# Patient Record
Sex: Female | Born: 1940 | Race: White | Hispanic: No | Marital: Single | Smoking: Former smoker
Health system: Southern US, Community
[De-identification: ages and names within clinical notes are randomized; demographics above are authoritative.]

## PROBLEM LIST (undated history)

## (undated) ENCOUNTER — Emergency Department (HOSPITAL_COMMUNITY): Payer: Medicare Other

## (undated) DIAGNOSIS — E785 Hyperlipidemia, unspecified: Secondary | ICD-10-CM

## (undated) DIAGNOSIS — C50919 Malignant neoplasm of unspecified site of unspecified female breast: Secondary | ICD-10-CM

## (undated) DIAGNOSIS — C44622 Squamous cell carcinoma of skin of right upper limb, including shoulder: Secondary | ICD-10-CM

## (undated) DIAGNOSIS — T451X5A Adverse effect of antineoplastic and immunosuppressive drugs, initial encounter: Secondary | ICD-10-CM

## (undated) DIAGNOSIS — R55 Syncope and collapse: Secondary | ICD-10-CM

## (undated) DIAGNOSIS — F1020 Alcohol dependence, uncomplicated: Secondary | ICD-10-CM

## (undated) DIAGNOSIS — I10 Essential (primary) hypertension: Secondary | ICD-10-CM

## (undated) DIAGNOSIS — G629 Polyneuropathy, unspecified: Secondary | ICD-10-CM

## (undated) DIAGNOSIS — F419 Anxiety disorder, unspecified: Secondary | ICD-10-CM

## (undated) DIAGNOSIS — H269 Unspecified cataract: Secondary | ICD-10-CM

## (undated) DIAGNOSIS — N907 Vulvar cyst: Secondary | ICD-10-CM

## (undated) DIAGNOSIS — I428 Other cardiomyopathies: Secondary | ICD-10-CM

## (undated) DIAGNOSIS — E118 Type 2 diabetes mellitus with unspecified complications: Secondary | ICD-10-CM

## (undated) DIAGNOSIS — F329 Major depressive disorder, single episode, unspecified: Secondary | ICD-10-CM

## (undated) DIAGNOSIS — G459 Transient cerebral ischemic attack, unspecified: Secondary | ICD-10-CM

## (undated) DIAGNOSIS — N2889 Other specified disorders of kidney and ureter: Secondary | ICD-10-CM

## (undated) DIAGNOSIS — E039 Hypothyroidism, unspecified: Secondary | ICD-10-CM

## (undated) DIAGNOSIS — G620 Drug-induced polyneuropathy: Secondary | ICD-10-CM

## (undated) DIAGNOSIS — T7840XA Allergy, unspecified, initial encounter: Secondary | ICD-10-CM

## (undated) DIAGNOSIS — N183 Chronic kidney disease, stage 3 unspecified: Secondary | ICD-10-CM

## (undated) DIAGNOSIS — K219 Gastro-esophageal reflux disease without esophagitis: Secondary | ICD-10-CM

## (undated) DIAGNOSIS — Z85828 Personal history of other malignant neoplasm of skin: Secondary | ICD-10-CM

## (undated) DIAGNOSIS — I639 Cerebral infarction, unspecified: Secondary | ICD-10-CM

## (undated) DIAGNOSIS — M810 Age-related osteoporosis without current pathological fracture: Secondary | ICD-10-CM

## (undated) HISTORY — DX: Other cardiomyopathies: I42.8

## (undated) HISTORY — DX: Personal history of other malignant neoplasm of skin: Z85.828

## (undated) HISTORY — DX: Drug-induced polyneuropathy: G62.0

## (undated) HISTORY — DX: Cerebral infarction, unspecified: I63.9

## (undated) HISTORY — DX: Vulvar cyst: N90.7

## (undated) HISTORY — PX: CARDIAC CATHETERIZATION: SHX172

## (undated) HISTORY — DX: Other specified disorders of kidney and ureter: N28.89

## (undated) HISTORY — DX: Age-related osteoporosis without current pathological fracture: M81.0

## (undated) HISTORY — PX: FRACTURE SURGERY: SHX138

## (undated) HISTORY — PX: CATARACT EXTRACTION, BILATERAL: SHX1313

## (undated) HISTORY — DX: Adverse effect of antineoplastic and immunosuppressive drugs, initial encounter: T45.1X5A

## (undated) HISTORY — DX: Type 2 diabetes mellitus with unspecified complications: E11.8

## (undated) HISTORY — DX: Squamous cell carcinoma of skin of right upper limb, including shoulder: C44.622

## (undated) HISTORY — DX: Hyperlipidemia, unspecified: E78.5

## (undated) HISTORY — PX: JOINT REPLACEMENT: SHX530

## (undated) HISTORY — DX: Allergy, unspecified, initial encounter: T78.40XA

## (undated) HISTORY — DX: Chronic kidney disease, stage 3 unspecified: N18.30

## (undated) HISTORY — DX: Major depressive disorder, single episode, unspecified: F32.9

## (undated) HISTORY — DX: Anxiety disorder, unspecified: F41.9

## (undated) HISTORY — DX: Polyneuropathy, unspecified: G62.9

## (undated) HISTORY — PX: OTHER SURGICAL HISTORY: SHX169

## (undated) HISTORY — DX: Alcohol dependence, uncomplicated: F10.20

## (undated) HISTORY — DX: Unspecified cataract: H26.9

## (undated) HISTORY — DX: Syncope and collapse: R55

## (undated) NOTE — *Deleted (*Deleted)
Patient Care Team: Jeoffrey Massed, MD as PCP - General (Family Medicine) Ovidio Kin, MD as Consulting Physician (General Surgery) Serena Croissant, MD as Consulting Physician (Hematology and Oncology) Dorothy Puffer, MD as Consulting Physician (Radiation Oncology) Glenna Fellows, MD as Consulting Physician (Plastic Surgery) Betsey Amen, MD as Consulting Physician (Endocrinology) Axel Filler, Larna Daughters, NP as Nurse Practitioner (Hematology and Oncology) Genia Del, MD as Consulting Physician (Obstetrics and Gynecology)  DIAGNOSIS: No diagnosis found.  SUMMARY OF ONCOLOGIC HISTORY: Oncology History  Breast cancer of upper-inner quadrant of right female breast (HCC)  02/29/2016 Initial Diagnosis   Right breast palpable mass (with silicone implants 1982), 3.5 cm on MRI, additional 3 cm anterior linear enhancement? 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 Ki-67 60%, T2 N0 stage 2A clinical stage   03/14/2016 Procedure   Right breast biopsy upper inner quadrant: IDC grade 3   03/28/2016 -  Neo-Adjuvant Chemotherapy   Neoadjuvant chemotherapy with dose dense Adriamycin and Cytoxan followed by Abraxane weekly 5 ( patient is diabetic and cannot take steroids)   07/15/2016 Breast MRI   Right breast: Spiculated mass 1.5 cm significantly smaller compared to prior,NME previously seen is not noted, no abnormal lymph nodes   09/13/2016 Surgery   Removal of the silicone implant due to intracapsular rupture and capsulectomy (Dr.Thimappa)   09/13/2016 Surgery   Right lumpectomy: IDC grade 3, 2 foci, 2 cm and 1.1 cm, 0/3 lymph nodes negative margins negative, ER 0%, PR 0%, HER-2 negative ratio 1.02, Ki-67 60%, RCB-II; ypT2ypN0 Stage 2A    10/20/2016 - 12/06/2016 Radiation Therapy   Adjuvant radiation therapy   12/30/2016 - 04/05/2017 Chemotherapy   Xeloda 1000 mg by mouth twice a day adjuvant therapy x 4 cycles    05/03/2017 - 05/24/2017 Chemotherapy   SWOG S 1418  Pembrolizumab on clinical trial stopped after 2 doses by patient preference (not due to toxicities .)     CHIEF COMPLIANT: Follow-up of right breast cancer and DVT on Xarelto   INTERVAL HISTORY: Marissa Hall is a 3 y.o. with above-mentioned history of right breast cancer treated with neoadjuvant chemotherapy, lumpectomy, radiation, and is currently on surveillance. 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 She presents to the clinic today for follow-up.   ALLERGIES:  is allergic to other and penicillins.  MEDICATIONS:  Current Outpatient Medications  Medication Sig Dispense Refill  . atorvastatin (LIPITOR) 80 MG tablet Take 80 mg by mouth every evening.     . cholecalciferol (VITAMIN D3) 25 MCG (1000 UNIT) tablet Take 1,000 Units by mouth daily.    . DULoxetine (CYMBALTA) 30 MG capsule Take 1 capsule (30 mg total) by mouth daily. 30 capsule 0  . gabapentin (NEURONTIN) 300 MG capsule Take 1 capsule (300 mg total) by mouth See admin instructions. Takes 300 mg in the morning and 600 mg at bedtime. 270 capsule 3  . levothyroxine (SYNTHROID) 112 MCG tablet 112 mcg daily before breakfast.     . lisinopril (ZESTRIL) 30 MG tablet Take 1 tablet (30 mg total) by mouth daily. 90 tablet 1  . metFORMIN (GLUCOPHAGE-XR) 500 MG 24 hr tablet Take 1,000 mg by mouth daily before supper.     . NONFORMULARY OR COMPOUNDED ITEM Estradiol vaginal cream 0.02% insert 1/4 of an applicator vaginally and thin layer vulva daily x 2 weeks.  Then 1/4 of an applicator vaginally and a thin layer on the vulva twice a week for long term (Patient taking differently: Apply 1 application  topically See admin instructions. Use 1/4 of an applicator vaginally and a thin layer on the vulva twice a week.) 90 each 3  . Propylene Glycol (SYSTANE BALANCE OP) Place 1 drop into both eyes daily as needed (for dry eyes).    . traZODone (DESYREL) 50 MG tablet Take 1 tablet (50 mg total) by mouth at bedtime. 90 tablet 3   No  current facility-administered medications for this visit.    PHYSICAL EXAMINATION: ECOG PERFORMANCE STATUS: {CHL ONC ECOG PS:[phone]}  There were no vitals filed for this visit. There were no vitals filed for this visit.  BREAST:*** No palpable masses or nodules in either right or left breasts. No palpable axillary supraclavicular or infraclavicular adenopathy no breast tenderness or nipple discharge. (exam performed in the presence of a chaperone)  LABORATORY DATA:  I have reviewed the data as listed CMP Latest Ref Rng & Units 11/14/2019 10/22/2019 04/24/2019  Glucose 70 - 99 mg/dL 161(W) 960(A) 540(J)  BUN 8 - 23 mg/dL 22 14 20   Creatinine 0.44 - 1.00 mg/dL 8.11 9.14(N) 8.29  Sodium 135 - 145 mmol/L 138 140 139  Potassium 3.5 - 5.1 mmol/L 4.2 4.3 4.8  Chloride 98 - 111 mmol/L 106 102 101  CO2 22 - 32 mmol/L 25 25 25   Calcium 8.9 - 10.3 mg/dL 9.2 9.8 9.7  Total Protein 6.5 - 8.1 g/dL 7.5 7.2 7.9  Total Bilirubin 0.3 - 1.2 mg/dL 5.6(O) 1.3(Y) 1.2  Alkaline Phos 38 - 126 U/L 58 65 62  AST 15 - 41 U/L 30 27 35  ALT 0 - 44 U/L 37 34 40    Lab Results  Component Value Date   WBC 4.5 11/14/2019   HGB 12.7 11/14/2019   HCT 38.3 11/14/2019   MCV 98.5 11/14/2019   PLT 136 (L) 11/14/2019   NEUTROABS 2.5 11/14/2019    ASSESSMENT & PLAN:  No problem-specific Assessment & Plan notes found for this encounter.    No orders of the defined types were placed in this encounter.  The patient has a good understanding of the overall plan. she agrees with it. she will call with any problems that may develop before the next visit here.  Total time spent: *** mins including face to face time and time spent for planning, charting and coordination of care  Serena Croissant, MD 04/26/2020  I, Kirt Boys Dorshimer, am acting as scribe for Dr. Serena Croissant.  {insert scribe attestation}

---
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
## 1962-06-06 DIAGNOSIS — F32A Depression, unspecified: Secondary | ICD-10-CM

## 1962-06-06 HISTORY — DX: Depression, unspecified: F32.A

## 1970-06-06 HISTORY — PX: ABDOMINAL HYSTERECTOMY: SHX81

## 1970-06-06 HISTORY — PX: APPENDECTOMY: SHX54

## 1980-06-06 HISTORY — PX: BREAST ENHANCEMENT SURGERY: SHX7

## 1984-06-06 DIAGNOSIS — E785 Hyperlipidemia, unspecified: Secondary | ICD-10-CM

## 1984-06-06 DIAGNOSIS — E1169 Type 2 diabetes mellitus with other specified complication: Secondary | ICD-10-CM | POA: Insufficient documentation

## 1984-06-06 HISTORY — DX: Hyperlipidemia, unspecified: E78.5

## 1986-06-06 DIAGNOSIS — E039 Hypothyroidism, unspecified: Secondary | ICD-10-CM

## 1986-06-06 HISTORY — DX: Hypothyroidism, unspecified: E03.9

## 2006-06-06 DIAGNOSIS — E118 Type 2 diabetes mellitus with unspecified complications: Secondary | ICD-10-CM

## 2006-06-06 DIAGNOSIS — N907 Vulvar cyst: Secondary | ICD-10-CM | POA: Insufficient documentation

## 2006-06-06 DIAGNOSIS — I10 Essential (primary) hypertension: Secondary | ICD-10-CM

## 2006-06-06 HISTORY — DX: Essential (primary) hypertension: I10

## 2006-06-06 HISTORY — DX: Type 2 diabetes mellitus with unspecified complications: E11.8

## 2007-06-07 HISTORY — PX: OTHER SURGICAL HISTORY: SHX169

## 2011-03-22 ENCOUNTER — Encounter (HOSPITAL_COMMUNITY): Payer: Self-pay

## 2011-03-22 ENCOUNTER — Emergency Department (HOSPITAL_COMMUNITY)
Admission: EM | Admit: 2011-03-22 | Discharge: 2011-03-22 | Disposition: A | Discharge status: Home / Self Care | Payer: Medicare Other | Attending: Emergency Medicine | Admitting: Emergency Medicine

## 2011-03-22 ENCOUNTER — Emergency Department (HOSPITAL_COMMUNITY): Payer: Medicare Other

## 2011-03-22 DIAGNOSIS — H43399 Other vitreous opacities, unspecified eye: Secondary | ICD-10-CM | POA: Insufficient documentation

## 2011-03-22 DIAGNOSIS — H543 Unqualified visual loss, both eyes: Secondary | ICD-10-CM | POA: Insufficient documentation

## 2011-03-22 DIAGNOSIS — H53149 Visual discomfort, unspecified: Secondary | ICD-10-CM | POA: Insufficient documentation

## 2011-03-22 DIAGNOSIS — G43109 Migraine with aura, not intractable, without status migrainosus: Secondary | ICD-10-CM | POA: Insufficient documentation

## 2011-03-22 HISTORY — DX: Transient cerebral ischemic attack, unspecified: G45.9

## 2011-03-22 HISTORY — DX: Essential (primary) hypertension: I10

## 2011-03-22 HISTORY — DX: Hypothyroidism, unspecified: E03.9

## 2011-03-22 LAB — DIFFERENTIAL
Lymphocytes Relative: 44 % (ref 12–46)
Lymphs Abs: 2.1 10*3/uL (ref 0.7–4.0)
Neutro Abs: 2.1 10*3/uL (ref 1.7–7.7)
Neutrophils Relative %: 44 % (ref 43–77)

## 2011-03-22 LAB — CBC
HCT: 39 % (ref 36.0–46.0)
Hemoglobin: 12.9 g/dL (ref 12.0–15.0)
MCV: 93.3 fL (ref 78.0–100.0)
RBC: 4.18 MIL/uL (ref 3.87–5.11)
WBC: 4.8 10*3/uL (ref 4.0–10.5)

## 2011-03-22 LAB — COMPREHENSIVE METABOLIC PANEL
AST: 15 U/L (ref 0–37)
Alkaline Phosphatase: 53 U/L (ref 39–117)
BUN: 22 mg/dL (ref 6–23)
CO2: 27 mEq/L (ref 19–32)
Chloride: 106 mEq/L (ref 96–112)
Creatinine, Ser: 0.67 mg/dL (ref 0.50–1.10)
GFR calc non Af Amer: 89 mL/min — ABNORMAL LOW (ref >90)
Total Bilirubin: 0.5 mg/dL (ref 0.3–1.2)

## 2011-03-26 DIAGNOSIS — G459 Transient cerebral ischemic attack, unspecified: Secondary | ICD-10-CM | POA: Insufficient documentation

## 2011-03-26 HISTORY — DX: Transient cerebral ischemic attack, unspecified: G45.9

## 2011-04-19 ENCOUNTER — Other Ambulatory Visit (HOSPITAL_COMMUNITY): Payer: Self-pay | Admitting: Radiology

## 2011-04-19 DIAGNOSIS — G459 Transient cerebral ischemic attack, unspecified: Secondary | ICD-10-CM

## 2011-04-20 ENCOUNTER — Ambulatory Visit (HOSPITAL_COMMUNITY): Payer: Medicare Other | Attending: Cardiology | Admitting: Radiology

## 2011-04-20 DIAGNOSIS — G459 Transient cerebral ischemic attack, unspecified: Secondary | ICD-10-CM | POA: Insufficient documentation

## 2011-04-20 DIAGNOSIS — E119 Type 2 diabetes mellitus without complications: Secondary | ICD-10-CM | POA: Insufficient documentation

## 2011-04-20 DIAGNOSIS — I059 Rheumatic mitral valve disease, unspecified: Secondary | ICD-10-CM | POA: Insufficient documentation

## 2011-04-21 ENCOUNTER — Encounter (HOSPITAL_COMMUNITY): Payer: Self-pay | Admitting: Diagnostic Neuroimaging

## 2011-06-07 DIAGNOSIS — K219 Gastro-esophageal reflux disease without esophagitis: Secondary | ICD-10-CM | POA: Insufficient documentation

## 2011-06-07 HISTORY — DX: Gastro-esophageal reflux disease without esophagitis: K21.9

## 2013-06-06 DIAGNOSIS — M810 Age-related osteoporosis without current pathological fracture: Secondary | ICD-10-CM

## 2013-06-06 HISTORY — DX: Age-related osteoporosis without current pathological fracture: M81.0

## 2014-02-19 DIAGNOSIS — F1020 Alcohol dependence, uncomplicated: Secondary | ICD-10-CM | POA: Insufficient documentation

## 2014-02-27 DIAGNOSIS — F339 Major depressive disorder, recurrent, unspecified: Secondary | ICD-10-CM | POA: Insufficient documentation

## 2014-06-11 DIAGNOSIS — F330 Major depressive disorder, recurrent, mild: Secondary | ICD-10-CM | POA: Diagnosis not present

## 2014-06-23 DIAGNOSIS — R300 Dysuria: Secondary | ICD-10-CM | POA: Diagnosis not present

## 2014-06-23 DIAGNOSIS — N390 Urinary tract infection, site not specified: Secondary | ICD-10-CM | POA: Diagnosis not present

## 2014-08-25 DIAGNOSIS — Z880 Allergy status to penicillin: Secondary | ICD-10-CM | POA: Diagnosis not present

## 2014-08-25 DIAGNOSIS — I10 Essential (primary) hypertension: Secondary | ICD-10-CM | POA: Diagnosis not present

## 2014-08-25 DIAGNOSIS — Z79899 Other long term (current) drug therapy: Secondary | ICD-10-CM | POA: Diagnosis not present

## 2014-08-25 DIAGNOSIS — Z9119 Patient's noncompliance with other medical treatment and regimen: Secondary | ICD-10-CM | POA: Diagnosis not present

## 2014-08-25 DIAGNOSIS — R079 Chest pain, unspecified: Secondary | ICD-10-CM | POA: Diagnosis not present

## 2014-08-25 DIAGNOSIS — R946 Abnormal results of thyroid function studies: Secondary | ICD-10-CM | POA: Diagnosis not present

## 2014-08-25 DIAGNOSIS — R001 Bradycardia, unspecified: Secondary | ICD-10-CM | POA: Diagnosis not present

## 2014-08-25 DIAGNOSIS — K219 Gastro-esophageal reflux disease without esophagitis: Secondary | ICD-10-CM | POA: Diagnosis not present

## 2014-08-25 DIAGNOSIS — R0789 Other chest pain: Secondary | ICD-10-CM | POA: Diagnosis not present

## 2014-08-25 DIAGNOSIS — Z888 Allergy status to other drugs, medicaments and biological substances status: Secondary | ICD-10-CM | POA: Diagnosis not present

## 2014-08-25 DIAGNOSIS — F319 Bipolar disorder, unspecified: Secondary | ICD-10-CM | POA: Diagnosis not present

## 2014-08-25 DIAGNOSIS — E039 Hypothyroidism, unspecified: Secondary | ICD-10-CM | POA: Diagnosis not present

## 2014-08-25 DIAGNOSIS — Z87891 Personal history of nicotine dependence: Secondary | ICD-10-CM | POA: Diagnosis not present

## 2014-08-25 DIAGNOSIS — R000 Tachycardia, unspecified: Secondary | ICD-10-CM | POA: Diagnosis not present

## 2014-08-25 DIAGNOSIS — E785 Hyperlipidemia, unspecified: Secondary | ICD-10-CM | POA: Diagnosis not present

## 2014-08-25 DIAGNOSIS — I4510 Unspecified right bundle-branch block: Secondary | ICD-10-CM | POA: Diagnosis not present

## 2014-09-15 DIAGNOSIS — J040 Acute laryngitis: Secondary | ICD-10-CM | POA: Diagnosis not present

## 2014-09-15 DIAGNOSIS — J029 Acute pharyngitis, unspecified: Secondary | ICD-10-CM | POA: Diagnosis not present

## 2014-09-15 DIAGNOSIS — J069 Acute upper respiratory infection, unspecified: Secondary | ICD-10-CM | POA: Diagnosis not present

## 2014-09-15 DIAGNOSIS — R05 Cough: Secondary | ICD-10-CM | POA: Diagnosis not present

## 2014-10-21 DIAGNOSIS — E119 Type 2 diabetes mellitus without complications: Secondary | ICD-10-CM | POA: Diagnosis not present

## 2014-10-21 DIAGNOSIS — H524 Presbyopia: Secondary | ICD-10-CM | POA: Diagnosis not present

## 2014-10-21 DIAGNOSIS — H52223 Regular astigmatism, bilateral: Secondary | ICD-10-CM | POA: Diagnosis not present

## 2014-10-21 DIAGNOSIS — H5203 Hypermetropia, bilateral: Secondary | ICD-10-CM | POA: Diagnosis not present

## 2014-10-29 DIAGNOSIS — E559 Vitamin D deficiency, unspecified: Secondary | ICD-10-CM | POA: Diagnosis not present

## 2014-10-29 DIAGNOSIS — F319 Bipolar disorder, unspecified: Secondary | ICD-10-CM | POA: Diagnosis not present

## 2014-10-29 DIAGNOSIS — E785 Hyperlipidemia, unspecified: Secondary | ICD-10-CM | POA: Diagnosis not present

## 2014-10-29 DIAGNOSIS — I10 Essential (primary) hypertension: Secondary | ICD-10-CM | POA: Diagnosis not present

## 2014-10-29 DIAGNOSIS — Z79899 Other long term (current) drug therapy: Secondary | ICD-10-CM | POA: Diagnosis not present

## 2014-10-29 DIAGNOSIS — Z6827 Body mass index (BMI) 27.0-27.9, adult: Secondary | ICD-10-CM | POA: Diagnosis not present

## 2014-10-29 DIAGNOSIS — E039 Hypothyroidism, unspecified: Secondary | ICD-10-CM | POA: Diagnosis not present

## 2014-10-29 DIAGNOSIS — E1165 Type 2 diabetes mellitus with hyperglycemia: Secondary | ICD-10-CM | POA: Diagnosis not present

## 2014-10-29 DIAGNOSIS — J45909 Unspecified asthma, uncomplicated: Secondary | ICD-10-CM | POA: Diagnosis not present

## 2014-10-29 DIAGNOSIS — E538 Deficiency of other specified B group vitamins: Secondary | ICD-10-CM | POA: Diagnosis not present

## 2014-12-18 DIAGNOSIS — L821 Other seborrheic keratosis: Secondary | ICD-10-CM | POA: Diagnosis not present

## 2014-12-18 DIAGNOSIS — L570 Actinic keratosis: Secondary | ICD-10-CM | POA: Diagnosis not present

## 2014-12-18 DIAGNOSIS — Z85828 Personal history of other malignant neoplasm of skin: Secondary | ICD-10-CM | POA: Diagnosis not present

## 2014-12-18 DIAGNOSIS — D045 Carcinoma in situ of skin of trunk: Secondary | ICD-10-CM | POA: Diagnosis not present

## 2014-12-18 DIAGNOSIS — D229 Melanocytic nevi, unspecified: Secondary | ICD-10-CM | POA: Diagnosis not present

## 2014-12-18 DIAGNOSIS — Z808 Family history of malignant neoplasm of other organs or systems: Secondary | ICD-10-CM | POA: Diagnosis not present

## 2014-12-18 DIAGNOSIS — L814 Other melanin hyperpigmentation: Secondary | ICD-10-CM | POA: Diagnosis not present

## 2014-12-19 DIAGNOSIS — D045 Carcinoma in situ of skin of trunk: Secondary | ICD-10-CM | POA: Diagnosis not present

## 2014-12-25 DIAGNOSIS — J302 Other seasonal allergic rhinitis: Secondary | ICD-10-CM | POA: Diagnosis not present

## 2014-12-25 DIAGNOSIS — R0981 Nasal congestion: Secondary | ICD-10-CM | POA: Diagnosis not present

## 2014-12-25 DIAGNOSIS — R05 Cough: Secondary | ICD-10-CM | POA: Diagnosis not present

## 2014-12-30 DIAGNOSIS — J302 Other seasonal allergic rhinitis: Secondary | ICD-10-CM | POA: Diagnosis not present

## 2014-12-30 DIAGNOSIS — R0981 Nasal congestion: Secondary | ICD-10-CM | POA: Diagnosis not present

## 2015-03-11 DIAGNOSIS — E559 Vitamin D deficiency, unspecified: Secondary | ICD-10-CM | POA: Diagnosis not present

## 2015-03-11 DIAGNOSIS — H6123 Impacted cerumen, bilateral: Secondary | ICD-10-CM | POA: Diagnosis not present

## 2015-03-11 DIAGNOSIS — J45909 Unspecified asthma, uncomplicated: Secondary | ICD-10-CM | POA: Diagnosis not present

## 2015-03-11 DIAGNOSIS — Z23 Encounter for immunization: Secondary | ICD-10-CM | POA: Diagnosis not present

## 2015-03-11 DIAGNOSIS — E1165 Type 2 diabetes mellitus with hyperglycemia: Secondary | ICD-10-CM | POA: Diagnosis not present

## 2015-03-11 DIAGNOSIS — E785 Hyperlipidemia, unspecified: Secondary | ICD-10-CM | POA: Diagnosis not present

## 2015-03-11 DIAGNOSIS — Z79899 Other long term (current) drug therapy: Secondary | ICD-10-CM | POA: Diagnosis not present

## 2015-03-11 DIAGNOSIS — E538 Deficiency of other specified B group vitamins: Secondary | ICD-10-CM | POA: Diagnosis not present

## 2015-03-11 DIAGNOSIS — E039 Hypothyroidism, unspecified: Secondary | ICD-10-CM | POA: Diagnosis not present

## 2015-03-11 DIAGNOSIS — I10 Essential (primary) hypertension: Secondary | ICD-10-CM | POA: Diagnosis not present

## 2015-03-11 DIAGNOSIS — F319 Bipolar disorder, unspecified: Secondary | ICD-10-CM | POA: Diagnosis not present

## 2015-03-11 DIAGNOSIS — Z6827 Body mass index (BMI) 27.0-27.9, adult: Secondary | ICD-10-CM | POA: Diagnosis not present

## 2015-04-15 DIAGNOSIS — H6983 Other specified disorders of Eustachian tube, bilateral: Secondary | ICD-10-CM | POA: Diagnosis not present

## 2015-04-15 DIAGNOSIS — J069 Acute upper respiratory infection, unspecified: Secondary | ICD-10-CM | POA: Diagnosis not present

## 2015-06-07 DIAGNOSIS — G629 Polyneuropathy, unspecified: Secondary | ICD-10-CM

## 2015-06-07 HISTORY — DX: Polyneuropathy, unspecified: G62.9

## 2015-06-08 DIAGNOSIS — M26622 Arthralgia of left temporomandibular joint: Secondary | ICD-10-CM | POA: Diagnosis not present

## 2015-06-18 DIAGNOSIS — D2260 Melanocytic nevi of unspecified upper limb, including shoulder: Secondary | ICD-10-CM | POA: Diagnosis not present

## 2015-06-18 DIAGNOSIS — D225 Melanocytic nevi of trunk: Secondary | ICD-10-CM | POA: Diagnosis not present

## 2015-06-18 DIAGNOSIS — C44722 Squamous cell carcinoma of skin of right lower limb, including hip: Secondary | ICD-10-CM | POA: Diagnosis not present

## 2015-06-18 DIAGNOSIS — Z85828 Personal history of other malignant neoplasm of skin: Secondary | ICD-10-CM | POA: Diagnosis not present

## 2015-06-18 DIAGNOSIS — Z86008 Personal history of in-situ neoplasm of other site: Secondary | ICD-10-CM | POA: Diagnosis not present

## 2015-06-18 DIAGNOSIS — D2270 Melanocytic nevi of unspecified lower limb, including hip: Secondary | ICD-10-CM | POA: Diagnosis not present

## 2015-06-18 DIAGNOSIS — L570 Actinic keratosis: Secondary | ICD-10-CM | POA: Diagnosis not present

## 2015-06-19 DIAGNOSIS — C44722 Squamous cell carcinoma of skin of right lower limb, including hip: Secondary | ICD-10-CM | POA: Diagnosis not present

## 2015-06-24 DIAGNOSIS — C44722 Squamous cell carcinoma of skin of right lower limb, including hip: Secondary | ICD-10-CM | POA: Diagnosis not present

## 2015-07-09 DIAGNOSIS — E559 Vitamin D deficiency, unspecified: Secondary | ICD-10-CM | POA: Diagnosis not present

## 2015-07-09 DIAGNOSIS — Z6826 Body mass index (BMI) 26.0-26.9, adult: Secondary | ICD-10-CM | POA: Diagnosis not present

## 2015-07-09 DIAGNOSIS — J449 Chronic obstructive pulmonary disease, unspecified: Secondary | ICD-10-CM | POA: Diagnosis not present

## 2015-07-09 DIAGNOSIS — E1140 Type 2 diabetes mellitus with diabetic neuropathy, unspecified: Secondary | ICD-10-CM | POA: Diagnosis not present

## 2015-07-09 DIAGNOSIS — E785 Hyperlipidemia, unspecified: Secondary | ICD-10-CM | POA: Diagnosis not present

## 2015-07-09 DIAGNOSIS — E538 Deficiency of other specified B group vitamins: Secondary | ICD-10-CM | POA: Diagnosis not present

## 2015-07-09 DIAGNOSIS — E039 Hypothyroidism, unspecified: Secondary | ICD-10-CM | POA: Diagnosis not present

## 2015-07-09 DIAGNOSIS — I10 Essential (primary) hypertension: Secondary | ICD-10-CM | POA: Diagnosis not present

## 2015-09-16 DIAGNOSIS — I10 Essential (primary) hypertension: Secondary | ICD-10-CM | POA: Diagnosis not present

## 2015-09-16 DIAGNOSIS — E1165 Type 2 diabetes mellitus with hyperglycemia: Secondary | ICD-10-CM | POA: Diagnosis not present

## 2015-09-16 DIAGNOSIS — E785 Hyperlipidemia, unspecified: Secondary | ICD-10-CM | POA: Diagnosis not present

## 2015-09-16 DIAGNOSIS — E1140 Type 2 diabetes mellitus with diabetic neuropathy, unspecified: Secondary | ICD-10-CM | POA: Diagnosis not present

## 2015-09-16 DIAGNOSIS — E1169 Type 2 diabetes mellitus with other specified complication: Secondary | ICD-10-CM | POA: Diagnosis not present

## 2015-09-16 DIAGNOSIS — E039 Hypothyroidism, unspecified: Secondary | ICD-10-CM | POA: Diagnosis not present

## 2015-10-28 DIAGNOSIS — E039 Hypothyroidism, unspecified: Secondary | ICD-10-CM | POA: Diagnosis not present

## 2015-11-04 DIAGNOSIS — E1169 Type 2 diabetes mellitus with other specified complication: Secondary | ICD-10-CM | POA: Diagnosis not present

## 2015-11-04 DIAGNOSIS — E1140 Type 2 diabetes mellitus with diabetic neuropathy, unspecified: Secondary | ICD-10-CM | POA: Diagnosis not present

## 2015-11-04 DIAGNOSIS — I10 Essential (primary) hypertension: Secondary | ICD-10-CM | POA: Diagnosis not present

## 2015-11-04 DIAGNOSIS — E1165 Type 2 diabetes mellitus with hyperglycemia: Secondary | ICD-10-CM | POA: Diagnosis not present

## 2015-11-04 DIAGNOSIS — E785 Hyperlipidemia, unspecified: Secondary | ICD-10-CM | POA: Diagnosis not present

## 2015-11-04 DIAGNOSIS — E039 Hypothyroidism, unspecified: Secondary | ICD-10-CM | POA: Diagnosis not present

## 2015-11-10 DIAGNOSIS — H6983 Other specified disorders of Eustachian tube, bilateral: Secondary | ICD-10-CM | POA: Diagnosis not present

## 2015-11-10 DIAGNOSIS — J329 Chronic sinusitis, unspecified: Secondary | ICD-10-CM | POA: Diagnosis not present

## 2015-12-16 DIAGNOSIS — E039 Hypothyroidism, unspecified: Secondary | ICD-10-CM | POA: Diagnosis not present

## 2015-12-22 DIAGNOSIS — D2260 Melanocytic nevi of unspecified upper limb, including shoulder: Secondary | ICD-10-CM | POA: Diagnosis not present

## 2015-12-22 DIAGNOSIS — L568 Other specified acute skin changes due to ultraviolet radiation: Secondary | ICD-10-CM | POA: Diagnosis not present

## 2015-12-22 DIAGNOSIS — I781 Nevus, non-neoplastic: Secondary | ICD-10-CM | POA: Diagnosis not present

## 2015-12-22 DIAGNOSIS — L821 Other seborrheic keratosis: Secondary | ICD-10-CM | POA: Diagnosis not present

## 2015-12-22 DIAGNOSIS — D045 Carcinoma in situ of skin of trunk: Secondary | ICD-10-CM | POA: Diagnosis not present

## 2015-12-22 DIAGNOSIS — D225 Melanocytic nevi of trunk: Secondary | ICD-10-CM | POA: Diagnosis not present

## 2015-12-22 DIAGNOSIS — D2270 Melanocytic nevi of unspecified lower limb, including hip: Secondary | ICD-10-CM | POA: Diagnosis not present

## 2015-12-22 DIAGNOSIS — Z85828 Personal history of other malignant neoplasm of skin: Secondary | ICD-10-CM | POA: Diagnosis not present

## 2015-12-22 DIAGNOSIS — L814 Other melanin hyperpigmentation: Secondary | ICD-10-CM | POA: Diagnosis not present

## 2016-01-20 DIAGNOSIS — Z1389 Encounter for screening for other disorder: Secondary | ICD-10-CM | POA: Diagnosis not present

## 2016-01-20 DIAGNOSIS — F411 Generalized anxiety disorder: Secondary | ICD-10-CM | POA: Diagnosis not present

## 2016-01-20 DIAGNOSIS — Z6826 Body mass index (BMI) 26.0-26.9, adult: Secondary | ICD-10-CM | POA: Diagnosis not present

## 2016-01-20 DIAGNOSIS — Z79899 Other long term (current) drug therapy: Secondary | ICD-10-CM | POA: Diagnosis not present

## 2016-01-20 DIAGNOSIS — F319 Bipolar disorder, unspecified: Secondary | ICD-10-CM | POA: Diagnosis not present

## 2016-01-20 DIAGNOSIS — G479 Sleep disorder, unspecified: Secondary | ICD-10-CM | POA: Diagnosis not present

## 2016-01-20 DIAGNOSIS — R51 Headache: Secondary | ICD-10-CM | POA: Diagnosis not present

## 2016-02-05 DIAGNOSIS — E039 Hypothyroidism, unspecified: Secondary | ICD-10-CM | POA: Diagnosis not present

## 2016-02-10 DIAGNOSIS — E785 Hyperlipidemia, unspecified: Secondary | ICD-10-CM | POA: Diagnosis not present

## 2016-02-10 DIAGNOSIS — E1159 Type 2 diabetes mellitus with other circulatory complications: Secondary | ICD-10-CM | POA: Diagnosis not present

## 2016-02-10 DIAGNOSIS — I10 Essential (primary) hypertension: Secondary | ICD-10-CM | POA: Diagnosis not present

## 2016-02-10 DIAGNOSIS — E1169 Type 2 diabetes mellitus with other specified complication: Secondary | ICD-10-CM | POA: Diagnosis not present

## 2016-02-10 DIAGNOSIS — E1165 Type 2 diabetes mellitus with hyperglycemia: Secondary | ICD-10-CM | POA: Diagnosis not present

## 2016-02-10 DIAGNOSIS — E1140 Type 2 diabetes mellitus with diabetic neuropathy, unspecified: Secondary | ICD-10-CM | POA: Diagnosis not present

## 2016-02-10 DIAGNOSIS — E039 Hypothyroidism, unspecified: Secondary | ICD-10-CM | POA: Diagnosis not present

## 2016-02-16 DIAGNOSIS — Z779 Other contact with and (suspected) exposures hazardous to health: Secondary | ICD-10-CM | POA: Diagnosis not present

## 2016-02-16 DIAGNOSIS — B977 Papillomavirus as the cause of diseases classified elsewhere: Secondary | ICD-10-CM | POA: Diagnosis not present

## 2016-02-25 DIAGNOSIS — N63 Unspecified lump in breast: Secondary | ICD-10-CM | POA: Diagnosis not present

## 2016-02-29 DIAGNOSIS — C50211 Malignant neoplasm of upper-inner quadrant of right female breast: Secondary | ICD-10-CM | POA: Diagnosis not present

## 2016-02-29 DIAGNOSIS — N63 Unspecified lump in breast: Secondary | ICD-10-CM | POA: Diagnosis not present

## 2016-02-29 DIAGNOSIS — N6011 Diffuse cystic mastopathy of right breast: Secondary | ICD-10-CM | POA: Diagnosis not present

## 2016-03-01 ENCOUNTER — Other Ambulatory Visit: Payer: Self-pay | Admitting: Radiology

## 2016-03-01 DIAGNOSIS — D0591 Unspecified type of carcinoma in situ of right breast: Secondary | ICD-10-CM

## 2016-03-02 ENCOUNTER — Telehealth: Payer: Self-pay | Admitting: *Deleted

## 2016-03-02 DIAGNOSIS — C50411 Malignant neoplasm of upper-outer quadrant of right female breast: Secondary | ICD-10-CM

## 2016-03-02 NOTE — Telephone Encounter (Signed)
Confirmed [facility] for 03/09/16 at 0815am .  Instructions and contact information given.

## 2016-03-04 ENCOUNTER — Ambulatory Visit
Admission: RE | Admit: 2016-03-04 | Discharge: 2016-03-04 | Disposition: A | Discharge status: Home / Self Care | Payer: Medicare Other | Source: Ambulatory Visit | Attending: Radiology | Admitting: Radiology

## 2016-03-04 DIAGNOSIS — D0591 Unspecified type of carcinoma in situ of right breast: Secondary | ICD-10-CM

## 2016-03-04 DIAGNOSIS — C50211 Malignant neoplasm of upper-inner quadrant of right female breast: Secondary | ICD-10-CM | POA: Diagnosis not present

## 2016-03-04 MED ORDER — GADOBENATE DIMEGLUMINE 529 MG/ML IV SOLN
20.0000 mL | Freq: Once | INTRAVENOUS | Status: AC | PRN
  Administered 2016-03-04: 16 mL via INTRAVENOUS

## 2016-03-09 ENCOUNTER — Ambulatory Visit: Payer: Medicare Other | Attending: Surgery | Admitting: Physical Therapy

## 2016-03-09 ENCOUNTER — Encounter: Payer: Self-pay | Admitting: Physical Therapy

## 2016-03-09 ENCOUNTER — Ambulatory Visit
Admission: RE | Admit: 2016-03-09 | Discharge: 2016-03-09 | Disposition: A | Discharge status: Home / Self Care | Payer: Medicare Other | Source: Ambulatory Visit | Attending: Radiation Oncology | Admitting: Radiation Oncology

## 2016-03-09 ENCOUNTER — Encounter: Payer: Self-pay | Admitting: Hematology and Oncology

## 2016-03-09 ENCOUNTER — Other Ambulatory Visit: Payer: Self-pay | Admitting: Surgery

## 2016-03-09 ENCOUNTER — Other Ambulatory Visit (HOSPITAL_BASED_OUTPATIENT_CLINIC_OR_DEPARTMENT_OTHER): Payer: Medicare Other

## 2016-03-09 ENCOUNTER — Encounter: Payer: Self-pay | Admitting: *Deleted

## 2016-03-09 ENCOUNTER — Other Ambulatory Visit: Payer: Self-pay | Admitting: Hematology and Oncology

## 2016-03-09 ENCOUNTER — Ambulatory Visit (HOSPITAL_BASED_OUTPATIENT_CLINIC_OR_DEPARTMENT_OTHER): Payer: Medicare Other | Admitting: Hematology and Oncology

## 2016-03-09 DIAGNOSIS — C50411 Malignant neoplasm of upper-outer quadrant of right female breast: Secondary | ICD-10-CM

## 2016-03-09 DIAGNOSIS — R293 Abnormal posture: Secondary | ICD-10-CM | POA: Insufficient documentation

## 2016-03-09 DIAGNOSIS — C50211 Malignant neoplasm of upper-inner quadrant of right female breast: Secondary | ICD-10-CM

## 2016-03-09 DIAGNOSIS — Z171 Estrogen receptor negative status [ER-]: Principal | ICD-10-CM

## 2016-03-09 DIAGNOSIS — G8929 Other chronic pain: Secondary | ICD-10-CM | POA: Diagnosis not present

## 2016-03-09 DIAGNOSIS — C50911 Malignant neoplasm of unspecified site of right female breast: Secondary | ICD-10-CM | POA: Diagnosis not present

## 2016-03-09 DIAGNOSIS — Z853 Personal history of malignant neoplasm of breast: Secondary | ICD-10-CM | POA: Insufficient documentation

## 2016-03-09 DIAGNOSIS — M25511 Pain in right shoulder: Secondary | ICD-10-CM | POA: Diagnosis not present

## 2016-03-09 LAB — COMPREHENSIVE METABOLIC PANEL
ALT: 42 U/L (ref 0–55)
ANION GAP: 10 meq/L (ref 3–11)
AST: 32 U/L (ref 5–34)
Albumin: 3.9 g/dL (ref 3.5–5.0)
Alkaline Phosphatase: 75 U/L (ref 40–150)
BILIRUBIN TOTAL: 0.91 mg/dL (ref 0.20–1.20)
BUN: 21.7 mg/dL (ref 7.0–26.0)
CHLORIDE: 105 meq/L (ref 98–109)
CO2: 23 meq/L (ref 22–29)
CREATININE: 0.9 mg/dL (ref 0.6–1.1)
Calcium: 9.4 mg/dL (ref 8.4–10.4)
EGFR: 66 mL/min/{1.73_m2} — ABNORMAL LOW (ref >90)
GLUCOSE: 216 mg/dL — AB (ref 70–140)
Potassium: 4.8 mEq/L (ref 3.5–5.1)
SODIUM: 139 meq/L (ref 136–145)
TOTAL PROTEIN: 7 g/dL (ref 6.4–8.3)

## 2016-03-09 LAB — CBC WITH DIFFERENTIAL/PLATELET
BASO%: 1.2 % (ref 0.0–2.0)
Basophils Absolute: 0.1 10*3/uL (ref 0.0–0.1)
EOS%: 3.1 % (ref 0.0–7.0)
Eosinophils Absolute: 0.1 10*3/uL (ref 0.0–0.5)
HCT: 40.1 % (ref 34.8–46.6)
HGB: 13.6 g/dL (ref 11.6–15.9)
LYMPH%: 35.6 % (ref 14.0–49.7)
MCH: 32.5 pg (ref 25.1–34.0)
MCHC: 33.9 g/dL (ref 31.5–36.0)
MCV: 95.9 fL (ref 79.5–101.0)
MONO#: 0.3 10*3/uL (ref 0.1–0.9)
MONO%: 6.9 % (ref 0.0–14.0)
NEUT%: 53.2 % (ref 38.4–76.8)
NEUTROS ABS: 2.2 10*3/uL (ref 1.5–6.5)
PLATELETS: 156 10*3/uL (ref 145–400)
RBC: 4.18 10*6/uL (ref 3.70–5.45)
RDW: 13.9 % (ref 11.2–14.5)
WBC: 4.2 10*3/uL (ref 3.9–10.3)
lymph#: 1.5 10*3/uL (ref 0.9–3.3)

## 2016-03-09 NOTE — Assessment & Plan Note (Signed)
02/29/2016: Right breast palpable mass (with silicone implants 0979), 3.5 cm on MRI, additional 3 cm anterior linear enhancement? 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   T2 N0 stage 2A clinical stage  Pathology and radiology counseling: Discussed with the patient, the details of pathology including the type of breast cancer,the clinical staging, the significance of ER, PR and HER-2/neu receptors and the implications for treatment. After reviewing the pathology in detail, we proceeded to discuss the different treatment options between surgery, radiation, chemotherapy, antiestrogen therapies.  Recommendation: 1. Neoadjuvant chemotherapy with dose dense Adriamycin and Cytoxan 4 followed by Abraxane weekly 12 (patient is diabetic and cannot take steroids) 2. followed by breast conserving surgery (with plastic surgery help regarding implant rupture) 3. Followed by radiation therapy  Chemotherapy Counseling: I discussed the risks and benefits of chemotherapy including the risks of nausea/ vomiting, risk of infection from low WBC count, fatigue due to chemo or anemia, bruising or bleeding due to low platelets, mouth sores, loss/ change in taste and decreased appetite. Liver and kidney function will be monitored through out chemotherapy as abnormalities in liver and kidney function may be a side effect of treatment. Cardiac dysfunction due to Adriamycin was discussed in detail. Risk of permanent bone marrow dysfunction and leukemia due to chemo were also discussed.  Plan: 1. Port placement 2. Echocardiogram 3. Chemotherapy class 4. We may have to biopsy the anterior extent which is suspected to be DCIS if breast conservation is preferred   Start chemotherapy in 1-2 weeks

## 2016-03-09 NOTE — Progress Notes (Signed)
Clinical Social Work [facility] Psychosocial Distress Screening [place]  Patient completed distress screening protocol and scored a 7 on the Psychosocial Distress Thermometer which indicates moderate distress. Clinical Social Worker met with patient in [facility] to assess for distress and other psychosocial needs. Patient stated she felt "better" after meeting with the treatment team and getting more information on her treatment plan. CSW and patient discussed common feeling and emotions when being diagnosed with cancer, and the importance of support during treatment. CSW informed patient of the support team and support services at [facility], and patient was agreeable to an [employer] referral. CSW provided contact information and encouraged patient to call with any questions or concerns.   ONCBCN DISTRESS SCREENING 03/09/2016  Screening Type Initial Screening  Distress experienced in past week (1-10) 7  Practical problem type Work/school  Emotional problem type Depression;Nervousness/Anxiety;Adjusting to illness  Spiritual/Religous concerns type Relating to God;Facing my mortality  Information Concerns Type Lack of info about diagnosis  Referral to clinical social work Yes    Johnnye Lana, MSW, LCSW, OSW-C Clinical Social Worker [facility] [phone]

## 2016-03-09 NOTE — Progress Notes (Signed)
[facility] CONSULT NOTE  Patient Care Team: Stephen D. Campbell, MD as PCP - General (Internal Medicine) David Newman, MD as Consulting Physician (General Surgery) Vinay Gudena, MD as Consulting Physician (Hematology and Oncology) John Moody, MD as Consulting Physician (Radiation Oncology)  CHIEF COMPLAINTS/PURPOSE OF CONSULTATION:  Newly diagnosed breast cancer  HISTORY OF PRESENTING ILLNESS:  Marissa Hall 75 y.o. female is here because of recent diagnosis of right breast cancer. She has prior history of silicone implants 1982 and had recently fallen on her breast and may have ruptured those implants at that time. She presented with a palpable mass in the right breast for the past 2 weeks. She subsequently underwent a mammogram and ultrasound that revealed a 3.2 cm mass in 1 to 2:00 position and an additional 6 mm lesion at 12:00 position which on biopsy was fibrocystic change. The biopsy revealed that this was a grade 3 invasive ductal carcinoma that was ER/PR negative and HER-2 negative with a Ki-67 of 60%. 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 This however was not biopsied. The total span was 6.4 cm. She was presented at the multidisciplinary tumor board and she is here at the multidisciplinary clinic to discuss the treatment plan.  I reviewed her records extensively and collaborated the history with the patient.  SUMMARY OF ONCOLOGIC HISTORY:   Breast cancer of upper-inner quadrant of right female breast (HCC)   02/29/2016 Initial Diagnosis    Right breast palpable mass (with silicone implants 1982), 3.5 cm on MRI, additional 3 cm anterior linear enhancement? 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 Anxiety   . Depression   . Diabetes (HCC)   .  Hypertension   . Hypothyroidism   . TIA (transient ischemic attack)     SURGICAL HISTORY: Past Surgical History:  Procedure Laterality Date  . ABDOMINAL HYSTERECTOMY  1972  . APPENDECTOMY  1972  . BREAST ENHANCEMENT SURGERY  1982  . surgical repair left ankle  2009    SOCIAL HISTORY: Social History   Social History  . Marital status: Legally Separated    Spouse name: N/A  . Number of children: N/A  . Years of education: N/A   Occupational History  . Not on file.   Social History Main Topics  . Smoking status: Former Smoker    Packs/day: 1.00    Types: Cigarettes  . Smokeless tobacco: Not on file  . Alcohol use Yes     Comment: wine daily  . Drug use: No  . Sexual activity: Not on file   Other Topics Concern  . Not on file   Social History Narrative  . No narrative on file    FAMILY HISTORY: History reviewed. No pertinent family history.  ALLERGIES:  is allergic to other and penicillins.  MEDICATIONS:  Current Outpatient Prescriptions  Medication Sig Dispense Refill  . atorvastatin (LIPITOR) 80 MG tablet Take by mouth.    . levothyroxine (SYNTHROID, LEVOTHROID) 100 MCG tablet Take by mouth.    . lisinopril (PRINIVIL,ZESTRIL) 20 MG tablet Take by mouth.    . metFORMIN (GLUCOPHAGE-XR) 500 MG 24 hr tablet Take by mouth.    . Cholecalciferol (VITAMIN D3) 5000 units CAPS Take by mouth.       No current facility-administered medications for this visit.     REVIEW OF SYSTEMS:   Constitutional: Denies fevers, chills or abnormal night sweats Eyes: Denies blurriness of vision, double vision or watery eyes Ears, nose, mouth, throat, and face: Denies mucositis or sore throat Respiratory: Denies cough, dyspnea or wheezes Cardiovascular: Denies palpitation, chest discomfort or lower extremity swelling Gastrointestinal:  Denies nausea, heartburn or change in bowel habits Skin: Denies abnormal skin rashes Lymphatics: Denies new lymphadenopathy or easy  bruising Neurological:Denies numbness, tingling or new weaknesses Behavioral/Psych: Mood is stable, no new changes  Breast: Palpable lump in the right breast All other systems were reviewed with the patient and are negative.  PHYSICAL EXAMINATION: ECOG PERFORMANCE STATUS: 1 - Symptomatic but completely ambulatory  Vitals:   03/09/16 0908  BP: (!) 156/84  Pulse: 73  Resp: 18  Temp: 98.1 F (36.7 C)   Filed Weights   03/09/16 0908  Weight: 181 lb (82.1 kg)    GENERAL:alert, no distress and comfortable SKIN: skin color, texture, turgor are normal, no rashes or significant lesions EYES: normal, conjunctiva are pink and non-injected, sclera clear OROPHARYNX:no exudate, no erythema and lips, buccal mucosa, and tongue normal  NECK: supple, thyroid normal size, non-tender, without nodularity LYMPH:  no palpable lymphadenopathy in the cervical, axillary or inguinal LUNGS: clear to auscultation and percussion with normal breathing effort HEART: regular rate & rhythm and no murmurs and no lower extremity edema ABDOMEN:abdomen soft, non-tender and normal bowel sounds Musculoskeletal:no cyanosis of digits and no clubbing  PSYCH: alert & oriented x 3 with fluent speech NEURO: no focal motor/sensory deficits BREAST: Large palpable lump in the right breast upper outer quadrant. No palpable axillary or supraclavicular lymphadenopathy (exam performed in the presence of a chaperone)   LABORATORY DATA:  I have reviewed the data as listed Lab Results  Component Value Date   WBC 4.2 03/09/2016   HGB 13.6 03/09/2016   HCT 40.1 03/09/2016   MCV 95.9 03/09/2016   PLT 156 03/09/2016   Lab Results  Component Value Date   NA 139 03/09/2016   K 4.8 03/09/2016   CL 106 03/22/2011   CO2 23 03/09/2016    RADIOGRAPHIC STUDIES: I have personally reviewed the radiological reports and agreed with the findings in the report.  ASSESSMENT AND PLAN:  Breast cancer of upper-inner quadrant of right  female breast (HCC) 02/29/2016: Right breast palpable mass (with silicone implants 1982), 3.5 cm on MRI, additional 3 cm anterior linear enhancement? 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   T2 N0 stage 2A clinical stage  Pathology and radiology counseling: Discussed with the patient, the details of pathology including the type of breast cancer,the clinical staging, the significance of ER, PR and HER-2/neu receptors and the implications for treatment. After reviewing the pathology in detail, we proceeded to discuss the different treatment options between surgery, radiation, chemotherapy, antiestrogen therapies.  Recommendation: 1. Neoadjuvant chemotherapy with dose dense Adriamycin and Cytoxan 4 followed by Abraxane weekly 12  (patient is diabetic and cannot take steroids) 2. followed by breast conserving surgery (with plastic surgery help regarding implant rupture) 3. Followed by radiation therapy  Chemotherapy Counseling: I discussed the risks and benefits of chemotherapy including the risks of nausea/ vomiting, risk of infection from low WBC count, fatigue due to chemo or anemia, bruising or bleeding due to low platelets, mouth sores, loss/ change in taste and decreased appetite. Liver and kidney function will be monitored through out chemotherapy as abnormalities in   liver and kidney function may be a side effect of treatment. Cardiac dysfunction due to Adriamycin was discussed in detail. Risk of permanent bone marrow dysfunction and leukemia due to chemo were also discussed.  Plan: 1. Port placement 2. Echocardiogram 3. Chemotherapy class 4. We may have to biopsy the anterior extent which is suspected to be DCIS if breast conservation is preferred   Start chemotherapy in 1-2 weeks     All questions were answered. The patient knows to call the clinic with any problems, questions or concerns.    Gudena, Vinay K, MD 03/09/16  

## 2016-03-09 NOTE — Patient Instructions (Signed)

## 2016-03-09 NOTE — Progress Notes (Signed)
START ON PATHWAY REGIMEN - Breast  [id number]: AC-T - [Doxorubicin + Cyclophosphamide q21 Days x 4 Cycles, Followed by Paclitaxel Weekly x 12 Weeks]  Doxorubicin + Cyclophosphamide (AC):   A cycle is every 21 days:     Doxorubicin (Adriamycin(R)) 60 mg/m2 IV Push followed by Dose Mod: None     Cyclophosphamide (Cytoxan(R)) 600 mg/m2 in 250 mL NS IV over 30 minutes Dose Mod: None  **Always confirm dose/schedule in your pharmacy ordering system**    Paclitaxel 80 mg/m2 Weekly:   Administer weekly:     Paclitaxel (Taxol(R)) 80 mg/m2 in 250 mL NS IV over 1 hour Dose Mod: None  **Always confirm dose/schedule in your pharmacy ordering system**    Clinician Notes: Abraxane is being used instead of Taxol because of patient has diabetes and cannot take steroids which are necessary for Taxol.  Patient Characteristics: Neoadjuvant Chemotherapy, HER2/neu Negative/Unknown/Equivocal AJCC Stage Grouping: IIA Current Disease Status: No Distant Mets or Local Recurrence AJCC M Stage: 0 ER Status: Negative (-) AJCC N Stage: 0 AJCC T Stage: 2 HER2/neu: Negative (-) PR Status: Negative (-)  Intent of Therapy: Curative Intent, Discussed with Patient

## 2016-03-09 NOTE — Progress Notes (Signed)
Radiation Oncology         [phone]) [phone] ________________________________  Name: Marissa Hall MRN: [id number]  Date: 03/09/2016  DOB: 30-Mar-1941  MH:[id number], Estill Bamberg., MD  Helen Hashimoto., MD     REFERRING PHYSICIAN: Helen Hashimoto., MD   DIAGNOSIS: The encounter diagnosis was Malignant neoplasm of upper-inner quadrant of right breast in female, estrogen receptor negative ([place]).   HISTORY OF PRESENT ILLNESS: Marissa Hall is a 75 y.o. female seen in the multidisciplinary breast cancer clinic for a new diagnosis of triple negative right sided breast cancer. The patient  found fullness in the right breast several months ago, and on mammogram she had a mass in the right breast. On diagnostic imaging, she was found to have a 3.2 cm right sided breast tumor, and a 47m area of fibrocystic change. She underwent biopsy of the dominant lesion on 02/29/16 that revealed triple negative invasive ductal carcinoma of the right breast. She had an MRI also performed on 03/04/16 revealing a 3.5 cm right sided breast tumor consistent with the biopsied lesion, as well as a 3 cm linear abnormality, totally spanning 6.4-6.5 cm. She has been counseled on another biopsy of the new linear contour abnormality of the right breast seen on MRI, and has met with Dr. GLindi Adieregarding neoadjuvant chemotherapy. Dr. MLisbeth Renshawis asked to see the patient to discuss the role of radiotherapy.    PREVIOUS RADIATION THERAPY: No   PAST MEDICAL HISTORY:  Past Medical History:  Diagnosis Date  . Anxiety   . Depression   . [place]   . Hypertension   . Hypothyroidism   . TIA (transient ischemic attack)        PAST SURGICAL HISTORY: Past Surgical History:  Procedure Laterality Date  . ABDOMINAL HYSTERECTOMY  1972  . APPENDECTOMY  1972  . BREAST ENHANCEMENT SURGERY  1982  . surgical repair left ankle  2009     FAMILY HISTORY: No family history on file.   SOCIAL HISTORY:  reports that she has  quit smoking. Her smoking use included Cigarettes. She smoked 1.00 pack per day. She does not have any smokeless tobacco history on file. She reports that she drinks alcohol. She reports that she does not use drugs. 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 atorvastatin (LIPITOR) 80 MG tablet Take by mouth.    . Cholecalciferol (VITAMIN D3) 5000 units CAPS Take by mouth.    . levothyroxine (SYNTHROID, LEVOTHROID) 100 MCG tablet Take by mouth.    .[name] Kitchenlisinopril (PRINIVIL,ZESTRIL) 20 MG tablet Take by mouth.    . metFORMIN (GLUCOPHAGE-XR) 500 MG 24 hr tablet Take by mouth.     No current facility-administered medications for this encounter.      REVIEW OF SYSTEMS: On review of systems, the patient reports that she is doing well overall. She denies any chest pain, shortness of breath, cough, fevers, chills, night sweats, unintended weight changes. She denies any bowel or bladder disturbances, and denies abdominal pain, nausea or vomiting. She denies any new musculoskeletal or joint aches or pains. A complete review of systems is obtained and is otherwise negative.     PHYSICAL EXAM:  Wt Readings from Last 3 Encounters:  03/09/16 181 lb (82.1 kg)   Temp Readings from Last 3 Encounters:  03/09/16 98.1 F (36.7 C) (Oral)  BP Readings from Last 3 Encounters:  03/09/16 (!) 156/84   Pulse Readings from Last 3 Encounters:  03/09/16 73    Pain scale 0/10 In general this is a well appearing caucasian female in no acute distress. She is alert and oriented x4 and appropriate throughout the examination. HEENT reveals that the patient is normocephalic, atraumatic. EOMs are intact. PERRLA. Skin is intact without any evidence of gross lesions. Cardiovascular exam reveals a regular rate and rhythm, no clicks rubs or murmurs are  auscultated. Chest is clear to auscultation bilaterally. Lymphatic assessment is performed and does not reveal any adenopathy in the cervical, supraclavicular, axillary, or inguinal chains. Bilateral breast exam is performed and does not reveal any palpable abnormalities of the left breast, there are post biopsy changes consistent with ecchymosis of the right. No nipple bleeding or discharge is noted. Abdomen has active bowel sounds in all quadrants and is intact. The abdomen is soft, non tender, non distended. Lower extremities are negative for pretibial pitting edema, deep calf tenderness, cyanosis or clubbing.   ECOG = 1  0 - Asymptomatic (Fully active, able to carry on all predisease activities without restriction)  1 - Symptomatic but completely ambulatory (Restricted in physically strenuous activity but ambulatory and able to carry out work of a light or sedentary nature. For example, light housework, office work)  2 - Symptomatic, <50% in bed during the day (Ambulatory and capable of all self care but unable to carry out any work activities. Up and about more than 50% of waking hours)  3 - Symptomatic, >50% in bed, but not bedbound (Capable of only limited self-care, confined to bed or chair 50% or more of waking hours)  4 - Bedbound (Completely disabled. Cannot carry on any self-care. Totally confined to bed or chair)  5 - Death   Eustace Pen MM, Creech RH, Tormey DC, et al. [id number]). "Toxicity and response criteria of the [facility]". [place] Oncol. 5 (6): 649-55    LABORATORY DATA:  Lab Results  Component Value Date   WBC 4.2 03/09/2016   HGB 13.6 03/09/2016   HCT 40.1 03/09/2016   MCV 95.9 03/09/2016   PLT 156 03/09/2016   Lab Results  Component Value Date   NA 139 03/09/2016   K 4.8 03/09/2016   CL 106 03/22/2011   CO2 23 03/09/2016   Lab Results  Component Value Date   ALT 42 03/09/2016   AST 32 03/09/2016   ALKPHOS 75 03/09/2016   BILITOT  0.91 03/09/2016      RADIOGRAPHY: Mr Breast Bilateral W Wo Contrast  Result Date: 03/07/2016 CLINICAL DATA:  Biopsy proven malignancy in the upper inner right breast. Fibrocystic changes biopsied in the upper outer right breast. LABS:  Creatinine 0.8.  GFR 71. EXAM: BILATERAL BREAST MRI WITH AND WITHOUT CONTRAST TECHNIQUE: Multiplanar, multisequence MR images of both breasts were obtained prior to and following the intravenous administration of 16 ml of MultiHance. THREE-DIMENSIONAL MR IMAGE RENDERING ON INDEPENDENT WORKSTATION: Three-dimensional MR images were rendered by post-processing of the original MR data on an independent workstation. The three-dimensional MR images were interpreted, and findings are reported in the following complete MRI report for this study. Three dimensional images were evaluated at the independent DynaCad workstation COMPARISON:  Previous exam(s). FINDINGS: Breast composition: Predominantly fatty tissue Background parenchymal enhancement: Mild Right breast: The patient's known malignancy in the upper inner right breast measures 3.5 cm. 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 The maximum extent of the probable DCIS is 3 cm. The maximum extent of known malignancy and probable DCIS is 6.4 cm. Mild enhancement medial to the known malignancy appears is thought to represent the tract of the second biopsy resulting in fibrocystic changes. No other suspicious findings in the right breast. There is an intracapsular rupture of the right breast implant. Left breast: No mass or abnormal enhancement. Lymph nodes: No abnormal appearing lymph nodes. Ancillary findings: Intracapsular rupture of the right implant. No definitive rupture of the left implant. IMPRESSION: 1. The patient's known malignancy measures approximately 3.5 cm. There is 3 cm of linear enhancement extending anterior  to the known malignancy spanning approximately 3 cm. This linear enhancement correlates with suspicious calcifications seen mammographically and is highly suspicious for DCIS. Including the linear enhancement, the total extent of disease would be approximately 6.4 cm. 2. No malignancy in the left breast identified. RECOMMENDATION: The linear enhancement described above, highly suspicious for DCIS, could be biopsied under MRI guidance if breast conservation is being considered. BI-RADS CATEGORY  4: Suspicious. Electronically Signed   By: Dorise Bullion III M.D   On: 03/07/2016 11:23       IMPRESSION/PLAN: 1. 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 Dr. Lisbeth Renshaw discusses the pathology findings and reviews the nature of triple negative invasive disease. The consensus from the breast conference included neoadjuvant therapy due to the triple negative component of her disease, followed by breast conservation with lumpectomy with sentinel mapping. The patient does have bilateral breast implants and has been found to have rupture in the affected breast. Provided that surgery can be completed with Dr. Lucia Gaskins and plastic surgery with lumpectomy, we would recommend external radiotherapy to the breast with 33 fractions over 6 1/2 weeks.  We discussed the risks, benefits, short, and long term effects of radiotherapy, and the patient is interested in proceeding. Dr. Lisbeth Renshaw discusses the delivery and logistics of radiotherapy. We will see her back about 2 weeks after surgery to move forward with the simulation and planning process and anticipate starting radiotherapy about 4 weeks after surgery.    The above documentation reflects my direct findings during this shared patient visit. Please see the separate note by Dr. Lisbeth Renshaw on this date for the remainder of the patient's plan of care.    Carola Rhine, PAC

## 2016-03-09 NOTE — Therapy (Addendum)
[place], [place], [address] Phone: [phone]   Fax:  [phone]  Physical Therapy Evaluation  Patient Details  Name: Marissa Hall MRN: [id number] Date of Birth: April 18, 1941 Referring Provider: Dr. Alphonsa Overall  Encounter Date: 03/09/2016      PT End of Session - 03/09/16 1251    Visit Number 1   Number of Visits 1   PT Start Time 1130   PT Stop Time 1205   PT Time Calculation (min) 35 min   Activity Tolerance Patient tolerated treatment well   Behavior During Therapy [facility] for tasks assessed/performed      Past Medical History:  Diagnosis Date  . Anxiety   . Depression   . Diabetes ([place])   . Hypertension   . Hypothyroidism   . TIA (transient ischemic attack)     Past Surgical History:  Procedure Laterality Date  . ABDOMINAL HYSTERECTOMY  1972  . APPENDECTOMY  1972  . BREAST ENHANCEMENT SURGERY  1982  . surgical repair left ankle  2009    There were no vitals filed for this visit.       Subjective Assessment - 03/09/16 1313    Subjective Patient reports she is here today to be assessed by her medical team for her newly diagnosed right breast cancer.   Pertinent History Patient was diagnosed on 02/25/16 with right grade 3 invasive ductal carcinoma. It is triple negative with the total area spanning 6.4 cm in the upper inner quadrant and a Ki67 of 60%.  She also was diagnosed with diabetes within the past year, has bilateral peripheral neuropathy in her feet, has right shoulder pain from a previous rotator cuff tear, and has some balance concerns with a fall in the past month.   Patient Stated Goals Reduce lymphedema risk and learn post op shoulder ROM HEP            [facility] PT Assessment - 03/09/16 0001      Assessment   Medical Diagnosis Right breast cancer   Referring Provider Dr. Alphonsa Overall   Onset Date/Surgical Date 02/25/16   Hand Dominance Right   Prior Therapy  none     Precautions   Precautions Other (comment)   Precaution Comments Active breast cancer; fall risk     Restrictions   Weight Bearing Restrictions No     Balance Screen   Has the patient fallen in the past 6 months Yes   How many times? 1   Has the patient had a decrease in activity level because of a fear of falling?  No   Is the patient reluctant to leave their home because of a fear of falling?  No     Home Environment   Living Environment Private residence   Living Arrangements Alone   Available Help at Discharge Friend(s)     Prior Function   Level of Independence Independent   Vocation Part time employment   Psychologist, forensic   Leisure She does not exercise     Cognition   Overall Cognitive Status Within Functional Limits for tasks assessed     Posture/Postural Control   Posture/Postural Control Postural limitations   Postural Limitations Rounded Shoulders;Forward head     ROM / Strength   AROM / PROM / Strength AROM;Strength     AROM   AROM Assessment Site Shoulder   Right/Left Shoulder Right;Left   Right Shoulder Extension 61 Degrees   Right Shoulder Flexion 144 Degrees  Right Shoulder ABduction 154 Degrees  Some c/o pain with abduction and flexion   Right Shoulder Internal Rotation 67 Degrees   Right Shoulder External Rotation 64 Degrees   Left Shoulder Extension 58 Degrees   Left Shoulder Flexion 135 Degrees   Left Shoulder ABduction 148 Degrees   Left Shoulder Internal Rotation 74 Degrees   Left Shoulder External Rotation 74 Degrees     Strength   Overall Strength Within functional limits for tasks performed           LYMPHEDEMA/ONCOLOGY QUESTIONNAIRE - 03/09/16 1249      Type   Cancer Type Right breast cancer     Lymphedema Assessments   Lymphedema Assessments Upper extremities     Right Upper Extremity Lymphedema   10 cm Proximal to Olecranon Process 27.1 cm   Olecranon Process 25.4 cm   10 cm Proximal to Ulnar  Styloid Process 22.5 cm   Just Proximal to Ulnar Styloid Process 16.4 cm   Across Hand at [employer] 19.1 cm   At [place] of 2nd Digit 6.3 cm     Left Upper Extremity Lymphedema   10 cm Proximal to Olecranon Process 27.1 cm   Olecranon Process 25.3 cm   10 cm Proximal to Ulnar Styloid Process 20.6 cm   Just Proximal to Ulnar Styloid Process 15.9 cm   Across Hand at [employer] 19.2 cm   At [place] of 2nd Digit 6.2 cm      Patient was instructed today in a home exercise program today for post op shoulder range of motion. These included active assist shoulder flexion in sitting, scapular retraction, wall walking with shoulder abduction, and hands behind head external rotation.  She was encouraged to do these twice a day, holding 3 seconds and repeating 5 times when permitted by her physician.         PT Education - 03/09/16 1251    Education provided Yes   Education Details Lymphedema risk reduction and post op shoulder ROM HEP   Person(s) Educated Patient   Methods Explanation;Demonstration;Handout   Comprehension Returned demonstration;Verbalized understanding              Breast Clinic Goals - 03/09/16 1301      Patient will be able to verbalize understanding of pertinent lymphedema risk reduction practices relevant to her diagnosis specifically related to skin care.   Time 1   Period Days   Status Achieved     Patient will be able to return demonstrate and/or verbalize understanding of the post-op home exercise program related to regaining shoulder range of motion.   Time 1   Period Days   Status Achieved     Patient will be able to verbalize understanding of the importance of attending the postoperative After Breast Cancer Class for further lymphedema risk reduction education and therapeutic exercise.   Time 1   Period Days   Status Achieved              Plan - 03/09/16 1252    Clinical Impression Statement Patient was diagnosed on 02/25/16 with  right grade 3 invasive ductal carcinoma. It is triple negative with the total area spanning 6.4 cm in the upper inner quadrant and a Ki67 of 60%.  Her multidisciplinary medical team met prior to her assessments to determine a recommended treatment plan.  She is planning to have neoadjuvant chemotherapy likely followed by a right lumpectomy with a sentinel node biopsy and radiation.  She will benefit  from physical therapy to address balance deficits with her peripheral neuropathies in her feet and to address her current shoulder pain and ROM deficits.  She would like to initiate PT but wants to wait to see when her other appointments are related to her cancer before scheduling.  She may benefit from post op PT after surgery to regain shoulder ROM and reduce lymphedema risk. Due to her comorbidities of recently diagnosed diabetes, a recent fall that would benefit from PT follow up to reduce other falls, and her right shoulder deficits, her eval is of moderate complexity.   Rehab Potential Excellent   Clinical Impairments Affecting Rehab Potential None except comorbidities listed above.   PT Frequency One time visit  But may assess her for balance and conditioning if she decides to do PT during chemotherapy.   PT Treatment/Interventions Patient/family education;Therapeutic exercise   PT Next Visit Plan She will consider initiating PT during neoadjuvant chemotherapy to address balance deficits, poor conditioning, and shoulder pain.  If so, evaluate balance and overall strength / function.   PT Home Exercise Plan Post op shoulder ROM HEP   Consulted and Agree with Plan of Care Patient      Patient will benefit from skilled therapeutic intervention in order to improve the following deficits and impairments:  Postural dysfunction, Decreased knowledge of precautions, Pain, Impaired UE functional use, Decreased range of motion  Visit Diagnosis: Chronic right shoulder pain - Plan: PT plan of care  cert/re-cert  Abnormal posture - Plan: PT plan of care cert/re-cert  Carcinoma of upper-inner quadrant of right breast in female, estrogen receptor negative ([place]) - Plan: PT plan of care cert/re-cert      G-Codes - 66/06/00 1301    Functional Assessment Tool Used Clinical Judgement   Functional Limitation Other PT primary   Other PT Primary Current Status (K5997) At least 1 percent but less than 20 percent impaired, limited or restricted   Other PT Primary Goal Status (F4142) At least 1 percent but less than 20 percent impaired, limited or restricted   Other PT Primary Discharge Status (L9532) At least 1 percent but less than 20 percent impaired, limited or restricted     Patient will follow up at outpatient cancer rehab if needed following surgery.  If the patient requires physical therapy at that time, a specific plan will be dictated and sent to the referring physician for approval. The patient was educated today on appropriate basic range of motion exercises to begin post operatively and the importance of attending the After Breast Cancer class following surgery.  Patient was educated today on lymphedema risk reduction practices as it pertains to recommendations that will benefit the patient immediately following surgery.  She verbalized good understanding.  No additional physical therapy is indicated at this time.     Problem List Patient Active Problem List   Diagnosis Date Noted  . Breast cancer of upper-inner quadrant of right female breast ([place]) 03/09/2016    Annia Friendly, PT 03/09/16 1:15 PM  [place], [place], [address] Phone: [phone]   Fax:  [phone]  Name: Marissa Hall MRN: [id number] Date of Birth: 1940/09/09

## 2016-03-10 NOTE — Patient Instructions (Signed)
Alan Schwieger  03/10/2016   Your procedure is scheduled on: Tuesday 03/15/2016  Report to [facility] Main  Entrance take [place]  elevators to 3rd floor to  [place] at  0830 AM.  Call this number if you have problems the morning of surgery [phone]   Remember: ONLY 1 PERSON MAY GO WITH YOU TO SHORT STAY TO GET  READY MORNING OF [place].   Do not eat food or drink liquids :After Midnight.     Take these medicines the morning of surgery with A SIP OF WATER: Levothyroxine              DO NOT TAKE ANY DIABETIC MEDICATIONS DAY OF YOUR SURGERY!                               You may not have any metal on your body including hair pins and              piercings  Do not wear jewelry, make-up, lotions, powders or perfumes, deodorant             Do not wear nail polish.  Do not shave  48 hours prior to surgery.              Men may shave face and neck.   Do not bring valuables to the hospital. [place].  Contacts, dentures or bridgework may not be worn into surgery.  Leave suitcase in the car. After surgery it may be brought to your room.     Patients discharged the day of surgery will not be allowed to drive home.  Name and phone number of your driver:  Special Instructions: N/A              Please read over the following fact sheets you were given: _____________________________________________________________________             [facility] - Preparing for Surgery Before surgery, you can play an important role.  Because skin is not sterile, your skin needs to be as free of germs as possible.  You can reduce the number of germs on your skin by washing with CHG (chlorahexidine gluconate) soap before surgery.  CHG is an antiseptic cleaner which kills germs and bonds with the skin to continue killing germs even after washing. Please DO NOT use if you have an allergy to CHG or antibacterial soaps.   If your skin becomes reddened/irritated stop using the CHG and inform your nurse when you arrive at Short Stay. Do not shave (including legs and underarms) for at least 48 hours prior to the first CHG shower.  You may shave your face/neck. Please follow these instructions carefully:  1.  Shower with CHG Soap the night before surgery and the  morning of Surgery.  2.  If you choose to wash your hair, wash your hair first as usual with your  normal  shampoo.  3.  After you shampoo, rinse your hair and body thoroughly to remove the  shampoo.                           4.  Use CHG as you would any other liquid  soap.  You can apply chg directly  to the skin and wash                       Gently with a scrungie or clean washcloth.  5.  Apply the CHG Soap to your body ONLY FROM THE NECK DOWN.   Do not use on face/ open                           Wound or open sores. Avoid contact with eyes, ears mouth and genitals (private parts).                       Wash face,  Genitals (private parts) with your normal soap.             6.  Wash thoroughly, paying special attention to the area where your surgery  will be performed.  7.  Thoroughly rinse your body with warm water from the neck down.  8.  DO NOT shower/wash with your normal soap after using and rinsing off  the CHG Soap.                9.  Pat yourself dry with a clean towel.            10.  Wear clean pajamas.            11.  Place clean sheets on your bed the night of your first shower and do not  sleep with pets. Day of Surgery : Do not apply any lotions/deodorants the morning of surgery.  Please wear clean clothes to the hospital/surgery center.  FAILURE TO FOLLOW THESE INSTRUCTIONS MAY RESULT IN THE CANCELLATION OF YOUR SURGERY PATIENT SIGNATURE_________________________________  NURSE SIGNATURE__________________________________  ________________________________________________________________________

## 2016-03-11 ENCOUNTER — Encounter (HOSPITAL_COMMUNITY)
Admission: RE | Admit: 2016-03-11 | Discharge: 2016-03-11 | Disposition: A | Discharge status: Home / Self Care | Payer: Medicare Other | Source: Ambulatory Visit | Attending: Surgery | Admitting: Surgery

## 2016-03-11 ENCOUNTER — Encounter (HOSPITAL_COMMUNITY): Payer: Self-pay | Admitting: *Deleted

## 2016-03-11 DIAGNOSIS — Z01810 Encounter for preprocedural cardiovascular examination: Secondary | ICD-10-CM | POA: Insufficient documentation

## 2016-03-11 DIAGNOSIS — Z01812 Encounter for preprocedural laboratory examination: Secondary | ICD-10-CM | POA: Insufficient documentation

## 2016-03-11 DIAGNOSIS — I10 Essential (primary) hypertension: Secondary | ICD-10-CM | POA: Diagnosis not present

## 2016-03-11 LAB — BASIC METABOLIC PANEL
Anion gap: 6 (ref 5–15)
BUN: 21 mg/dL — AB (ref 6–20)
CALCIUM: 9 mg/dL (ref 8.9–10.3)
CO2: 26 mmol/L (ref 22–32)
CREATININE: 0.77 mg/dL (ref 0.44–1.00)
Chloride: 106 mmol/L (ref 101–111)
GFR calc non Af Amer: >60 mL/min (ref >60)
Glucose, Bld: 241 mg/dL — ABNORMAL HIGH (ref 65–99)
Potassium: 4.5 mmol/L (ref 3.5–5.1)
SODIUM: 138 mmol/L (ref 135–145)

## 2016-03-14 ENCOUNTER — Ambulatory Visit
Admission: RE | Admit: 2016-03-14 | Discharge: 2016-03-14 | Disposition: A | Discharge status: Home / Self Care | Payer: Medicare Other | Source: Ambulatory Visit | Attending: Hematology and Oncology | Admitting: Hematology and Oncology

## 2016-03-14 ENCOUNTER — Telehealth: Payer: Self-pay | Admitting: *Deleted

## 2016-03-14 ENCOUNTER — Other Ambulatory Visit: Payer: Medicare Other

## 2016-03-14 DIAGNOSIS — Z171 Estrogen receptor negative status [ER-]: Principal | ICD-10-CM

## 2016-03-14 DIAGNOSIS — N6312 Unspecified lump in the right breast, upper inner quadrant: Secondary | ICD-10-CM | POA: Diagnosis not present

## 2016-03-14 DIAGNOSIS — C50211 Malignant neoplasm of upper-inner quadrant of right female breast: Secondary | ICD-10-CM

## 2016-03-14 DIAGNOSIS — C50919 Malignant neoplasm of unspecified site of unspecified female breast: Secondary | ICD-10-CM

## 2016-03-14 HISTORY — PX: BREAST SURGERY: SHX581

## 2016-03-14 HISTORY — DX: Malignant neoplasm of unspecified site of unspecified female breast: C50.919

## 2016-03-14 MED ORDER — GADOBENATE DIMEGLUMINE 529 MG/ML IV SOLN
17.0000 mL | Freq: Once | INTRAVENOUS | Status: AC | PRN
  Administered 2016-03-14: 17 mL via INTRAVENOUS

## 2016-03-14 NOTE — Telephone Encounter (Signed)
  Oncology Nurse Navigator Documentation  Navigator Location: [facility] Onc (03/14/16 1400) Navigator Encounter Type: Telephone (03/14/16 1400) Telephone: Outgoing Call;Clinic/MDC Follow-up (03/14/16 1400)                                        Time Spent with Patient: 15 (03/14/16 1400)

## 2016-03-14 NOTE — Progress Notes (Signed)
03/09/2016- noted labs in EPIC- CBC,CMET 02/10/2016- noted in [facility] Everywhere , HGA1C

## 2016-03-14 NOTE — H&P (Signed)
Marissa Hall  Location: [facility] Patient #: [id number] DOB: 1940/09/29 Undefined / Language: Cleophus Molt / Race: White Female   History of Present Illness   The patient is a 75 year old female who presents with a complaint of breast cancer.   The PCP is Dr. Hale Bogus [facility])  The patient was referred by Dr. Andris Baumann.  She is at the [facility] - Drs. Gudena/Moody.  Sees Dr. Meyer Russel, endocrinology, [facility].  She comes by herself.  She has a history of bilateral silicone implants in [address] [place], [place]. She has not had a mammogram in 10 years, because she thinks for her last mammogram, the techs put her in a vise and left for lunch (this was her preception). She tripped about 3 months ago, fell into the doorway, had a large black and blue bruise of the right breast/chest. Around that time, she felt a lump in her right breast. Then a little while after the fall, she felt a serring pain in ther right breast. She wonders if that is related to her right breast rupturing. She had her last period in 1972 - she had a hysterectomy at that time for benign reasons. There is no family history of breast cancer. She is not on hormones. 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 The biopsy at 11:30 o'clock showed fibrocystic changes.  She had an MRI on 03/04/2016 which showed the 3.5 cm known malignancy, but there is also a 3 cm linear enhancement extending anteriorly. The total disease is 6.4 cm. She also has an implant rupture on the right, but not on the left.  I discussed the options for breast cancer treatment with the patient. She is at the [facility], which includes medical oncology and radiation oncology. I discussed the surgical  options of lumpectomy vs. mastectomy. If mastectomy, there is the possibility of reconstruction. I discussed the options of lymph node biopsy. The treatment plan depends on the pathologic staging of the tumor and the patient's personal wishes. The risks of surgery include, but are not limited to, bleeding, infection, the need for further surgery, and nerve injury. The patient has been given literature on the treatment of breast cancer. I gave her a copy of her path report and MRI report.  Plan: 1) Another biopsy of right breast area which was seen on MRI, 2) Power port, 3) Neoadjuvant chemotherapy, 4) I will see her q 2 months during her treatment, 4) Final surgery depends on MRI biopsy, repsonse to chemotx. If she keeps her breast, we'll need plastic surgery to weigh in on reputrued implant and reconstruction.  Past Medical History: 1. Anxiety 2. Bipolar disorder 3. DM - Sees Dr. Geoffery Lyons, endocrinology, [facility]. Since 2008. She lost weight and came off meds for a while, but when she regained the weight and her DM is back 4. HTN since 2012 She had a tachy salvo condition in 2011 and saw a cardiologyst, she has not had any further problems since then 5. hypothyroid 6. Hysterectomy - 1972  Social History: Divorced. Came by herself. Has one son who lives near [place], [place]. 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    Other Problems Tawni Pummel, RN; 03/09/2016 7:53 AM) Alcohol Abuse Anxiety Disorder Breast Cancer Depression Diabetes Mellitus Gastroesophageal  Reflux Disease High blood pressure Hypercholesterolemia Lump In Breast Melanoma Thyroid Disease  Past Surgical History Tawni Pummel, RN; 03/09/2016 7:53 AM) Appendectomy Breast Augmentation Bilateral. Breast Biopsy Right. Foot Surgery Left. Hysterectomy (not due to cancer) - Partial Oral Surgery Tonsillectomy  Diagnostic Studies  History Tawni Pummel, RN; 03/09/2016 7:53 AM) Colonoscopy 5-10 years ago Mammogram within last year Pap Smear 1-5 years ago  Medication History Tawni Pummel, RN; 03/09/2016 7:53 AM) Medications Reconciled  Social History Tawni Pummel, RN; 03/09/2016 7:53 AM) Alcohol use Moderate alcohol use. Caffeine use Coffee, Tea. No drug use Tobacco use Former smoker.  Family History Tawni Pummel, RN; 03/09/2016 7:53 AM) Alcohol Abuse Father, Mother, Son. Arthritis Son. Cerebrovascular Accident Brother, Family Members In [place], Mother. Depression Father, Mother. Diabetes Mellitus Brother, Family Members In General. Heart Disease Family Members In General. Thyroid problems Mother.  Pregnancy / Birth History Tawni Pummel, RN; 03/09/2016 7:53 AM) Age at menarche 77 years. Age of menopause 73-50 Contraceptive History Oral contraceptives. Gravida 1 Irregular periods Maternal age 44-20 Para 1    Review of Systems Sunday Spillers Ledford RN; 03/09/2016 7:53 AM) General Present- Fatigue. Not Present- Appetite Loss, Chills, Fever, Night Sweats, Weight Gain and Weight Loss. HEENT Present- Seasonal Allergies and Wears glasses/contact lenses. Not Present- Earache, Hearing Loss, Hoarseness, Nose Bleed, Oral Ulcers, Ringing in the Ears, Sinus Pain, Sore Throat, Visual Disturbances and Yellow Eyes. Breast Present- Breast Mass and Breast Pain. Not Present- Nipple Discharge and Skin Changes. Cardiovascular Not Present- Chest Pain, Difficulty Breathing Lying Down, Leg Cramps, Palpitations, Rapid Heart Rate, Shortness of Breath and Swelling of Extremities. Gastrointestinal Present- Bloating. Not Present- Abdominal Pain, Bloody Stool, Change in Bowel Habits, Chronic diarrhea, Constipation, Difficulty Swallowing, Excessive gas, Gets full quickly at meals, Hemorrhoids, Indigestion, Nausea, Rectal Pain and Vomiting. Female Genitourinary Not Present- Frequency, Nocturia, Painful  Urination, Pelvic Pain and Urgency. Musculoskeletal Present- Muscle Weakness. Not Present- Back Pain, Joint Pain, Joint Stiffness, Muscle Pain and Swelling of Extremities. Neurological Present- Tingling. Not Present- Decreased Memory, Fainting, Headaches, Numbness, Seizures, Tremor, Trouble walking and Weakness. Psychiatric Not Present- Anxiety, Bipolar, Change in Sleep Pattern, Depression, Fearful and Frequent crying. Endocrine Not Present- Cold Intolerance, Excessive Hunger, Hair Changes, Heat Intolerance, Hot flashes and New Diabetes. Hematology Not Present- Blood Thinners, Easy Bruising, Excessive bleeding, Gland problems, HIV and Persistent Infections.   Physical Exam  General: WN WFalert and generally healthy appearing. Skin: Inspection and palpation of the skin unremarkable.  Eyes: Conjunctivae white, pupils equal. Face, ears, nose, mouth, and throat: Face - normal. Normal ears and nose. Lips and teeth normal.  Neck: Supple. No mass. Trachea midline. No thyroid mass.  Lymph Nodes: No supraclavicular or cervical adenopathy. No axillary adenopathy.  Breasts: right - Mass, about 3 x 4 cm, in the UIQ right breast. The implants are firm. Left - The implants are firm. I feel no mass.  Lungs: Normal respiratory effort. Clear to auscultation and symmetric breath sounds. Cardiovascular: Regular rate and rythm. Normal auscultation of the heart. No murmur or rub. Normal carotid pulse.  Abdomen: Soft. No mass. Liver and spleen not palpable. No tenderness. No hernia. Normal bowel sounds.   Musculoskeletal/extremities: Normal gait. Good strength and ROM in upper and lower extremities.   Neurologic: Grossly intact to motor and sensory function.  No obvious deficit in the cranial nerves. Psychiatric: Has normal mood and affect. Judgement and insight appear normal.    Assessment & Plan  1.  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  Oncology - Gudena/Moody  Plan:   1) Another biopsy of right breast area which was seen on MRI    This is scheduled for 03/13/2016   2) Power port -    I discussed the indications and potential complications of the power port placement.  The primary complications of the power port, include, but are not limited to, bleeding, infection, nerve injury, thrombosis, and pneumothorax.   3) Neoadjuvant chemotherapy,   4) I will see her q 2 months during her treatment,   5) Final surgery depends on MRI biopsy, repsonse to chemotx. If she keeps her breast, we'll need plastic surgery to weigh in on reputrued implant and reconstruction.  2. Anxiety 3. Bipolar disorder 4. DM - Sees Dr. Geoffery Lyons, endocrinology, [facility] Since 2008. She lost weight and came off meds for a while, but when she regained the weight and her DM is back 5. HTN since 2012 She had a tachy salvo condition in 2011 and saw a cardiologyst, she has not had any further problems since then 6. hypothyroid 7. Hysterectomy - 1972  Alphonsa Overall, MD, [facility] Pager: [phone] Office phone:  [phone]

## 2016-03-15 ENCOUNTER — Ambulatory Visit (HOSPITAL_COMMUNITY): Payer: Medicare Other | Admitting: Anesthesiology

## 2016-03-15 ENCOUNTER — Encounter (HOSPITAL_COMMUNITY)
Admission: RE | Disposition: A | Discharge status: Home / Self Care | Payer: Self-pay | Source: Ambulatory Visit | Attending: Surgery

## 2016-03-15 ENCOUNTER — Ambulatory Visit (HOSPITAL_COMMUNITY): Payer: Medicare Other

## 2016-03-15 ENCOUNTER — Observation Stay (HOSPITAL_COMMUNITY)
Admission: RE | Admit: 2016-03-15 | Discharge: 2016-03-16 | Disposition: A | Discharge status: Home / Self Care | Payer: Medicare Other | Source: Ambulatory Visit | Attending: Surgery | Admitting: Surgery

## 2016-03-15 ENCOUNTER — Encounter (HOSPITAL_COMMUNITY): Payer: Self-pay | Admitting: *Deleted

## 2016-03-15 DIAGNOSIS — E119 Type 2 diabetes mellitus without complications: Secondary | ICD-10-CM | POA: Diagnosis not present

## 2016-03-15 DIAGNOSIS — Z8673 Personal history of transient ischemic attack (TIA), and cerebral infarction without residual deficits: Secondary | ICD-10-CM | POA: Diagnosis not present

## 2016-03-15 DIAGNOSIS — C50211 Malignant neoplasm of upper-inner quadrant of right female breast: Principal | ICD-10-CM | POA: Insufficient documentation

## 2016-03-15 DIAGNOSIS — Z90710 Acquired absence of both cervix and uterus: Secondary | ICD-10-CM | POA: Diagnosis not present

## 2016-03-15 DIAGNOSIS — Z7984 Long term (current) use of oral hypoglycemic drugs: Secondary | ICD-10-CM | POA: Diagnosis not present

## 2016-03-15 DIAGNOSIS — Z87891 Personal history of nicotine dependence: Secondary | ICD-10-CM | POA: Insufficient documentation

## 2016-03-15 DIAGNOSIS — Z79899 Other long term (current) drug therapy: Secondary | ICD-10-CM | POA: Insufficient documentation

## 2016-03-15 DIAGNOSIS — C50911 Malignant neoplasm of unspecified site of right female breast: Secondary | ICD-10-CM | POA: Diagnosis present

## 2016-03-15 DIAGNOSIS — Z95828 Presence of other vascular implants and grafts: Secondary | ICD-10-CM

## 2016-03-15 DIAGNOSIS — Z9882 Breast implant status: Secondary | ICD-10-CM | POA: Diagnosis not present

## 2016-03-15 DIAGNOSIS — F419 Anxiety disorder, unspecified: Secondary | ICD-10-CM | POA: Insufficient documentation

## 2016-03-15 DIAGNOSIS — E039 Hypothyroidism, unspecified: Secondary | ICD-10-CM | POA: Diagnosis not present

## 2016-03-15 DIAGNOSIS — I10 Essential (primary) hypertension: Secondary | ICD-10-CM | POA: Diagnosis not present

## 2016-03-15 DIAGNOSIS — J9811 Atelectasis: Secondary | ICD-10-CM | POA: Diagnosis not present

## 2016-03-15 DIAGNOSIS — E139 Other specified diabetes mellitus without complications: Secondary | ICD-10-CM | POA: Diagnosis not present

## 2016-03-15 HISTORY — PX: PORTACATH PLACEMENT: SHX2246

## 2016-03-15 LAB — GLUCOSE, CAPILLARY
GLUCOSE-CAPILLARY: 128 mg/dL — AB (ref 65–99)
Glucose-Capillary: 170 mg/dL — ABNORMAL HIGH (ref 65–99)

## 2016-03-15 SURGERY — INSERTION, TUNNELED CENTRAL VENOUS DEVICE, WITH PORT
Anesthesia: General

## 2016-03-15 MED ORDER — IBUPROFEN 200 MG PO TABS: 600.0000 mg | ORAL_TABLET | Freq: Four times a day (QID) | ORAL | Status: DC | PRN

## 2016-03-15 MED ORDER — EPHEDRINE SULFATE 50 MG/ML IJ SOLN
INTRAMUSCULAR | Status: DC | PRN
  Administered 2016-03-15: 10 mg via INTRAVENOUS

## 2016-03-15 MED ORDER — LIDOCAINE HCL 1 % IJ SOLN
INTRAMUSCULAR | Status: AC
  Filled 2016-03-15: qty 20

## 2016-03-15 MED ORDER — CEFAZOLIN SODIUM-DEXTROSE 2-4 GM/100ML-% IV SOLN
2.0000 g | INTRAVENOUS | Status: AC
  Administered 2016-03-15: 2 g via INTRAVENOUS
  Filled 2016-03-15: qty 100

## 2016-03-15 MED ORDER — LISINOPRIL 20 MG PO TABS
20.0000 mg | ORAL_TABLET | Freq: Every evening | ORAL | Status: DC
  Administered 2016-03-15: 20 mg via ORAL
  Filled 2016-03-15: qty 1

## 2016-03-15 MED ORDER — LIDOCAINE 2% (20 MG/ML) 5 ML SYRINGE
INTRAMUSCULAR | Status: DC | PRN
  Administered 2016-03-15: 100 mg via INTRAVENOUS

## 2016-03-15 MED ORDER — LIDOCAINE HCL (PF) 1 % IJ SOLN
INTRAMUSCULAR | Status: DC | PRN
  Administered 2016-03-15: 10 mL

## 2016-03-15 MED ORDER — CEFAZOLIN SODIUM-DEXTROSE 2-4 GM/100ML-% IV SOLN
INTRAVENOUS | Status: AC
  Filled 2016-03-15: qty 100

## 2016-03-15 MED ORDER — HYDROCODONE-ACETAMINOPHEN 5-325 MG PO TABS
1.0000 | ORAL_TABLET | ORAL | Status: DC | PRN
  Administered 2016-03-15 – 2016-03-16 (×2): 1 via ORAL
  Filled 2016-03-15 (×2): qty 1

## 2016-03-15 MED ORDER — ONDANSETRON HCL 4 MG/2ML IJ SOLN
INTRAMUSCULAR | Status: DC | PRN
  Administered 2016-03-15: 4 mg via INTRAVENOUS

## 2016-03-15 MED ORDER — CHLORHEXIDINE GLUCONATE CLOTH 2 % EX PADS: 6.0000 | MEDICATED_PAD | Freq: Once | CUTANEOUS | Status: DC

## 2016-03-15 MED ORDER — MEPERIDINE HCL 50 MG/ML IJ SOLN: 6.2500 mg | INTRAMUSCULAR | Status: DC | PRN

## 2016-03-15 MED ORDER — FENTANYL CITRATE (PF) 100 MCG/2ML IJ SOLN
INTRAMUSCULAR | Status: DC | PRN
  Administered 2016-03-15 (×2): 50 ug via INTRAVENOUS

## 2016-03-15 MED ORDER — METFORMIN HCL ER 500 MG PO TB24
1000.0000 mg | ORAL_TABLET | Freq: Every day | ORAL | Status: DC
  Administered 2016-03-15: 1000 mg via ORAL
  Filled 2016-03-15: qty 2

## 2016-03-15 MED ORDER — PROPOFOL 10 MG/ML IV BOLUS
INTRAVENOUS | Status: AC
  Filled 2016-03-15: qty 20

## 2016-03-15 MED ORDER — LACTATED RINGERS IV SOLN
INTRAVENOUS | Status: DC
  Administered 2016-03-15 (×2): via INTRAVENOUS

## 2016-03-15 MED ORDER — ONDANSETRON HCL 4 MG/2ML IJ SOLN
INTRAMUSCULAR | Status: AC
  Filled 2016-03-15: qty 2

## 2016-03-15 MED ORDER — PROMETHAZINE HCL 25 MG/ML IJ SOLN: 6.2500 mg | INTRAMUSCULAR | Status: DC | PRN

## 2016-03-15 MED ORDER — FENTANYL CITRATE (PF) 100 MCG/2ML IJ SOLN
INTRAMUSCULAR | Status: AC
  Filled 2016-03-15: qty 2

## 2016-03-15 MED ORDER — LORAZEPAM 0.5 MG PO TABS
0.5000 mg | ORAL_TABLET | Freq: Two times a day (BID) | ORAL | Status: DC
  Administered 2016-03-15: 0.5 mg via ORAL
  Filled 2016-03-15: qty 1

## 2016-03-15 MED ORDER — HYDROMORPHONE HCL 1 MG/ML IJ SOLN
0.2500 mg | INTRAMUSCULAR | Status: DC | PRN
  Administered 2016-03-15 (×2): 0.25 mg via INTRAVENOUS
  Administered 2016-03-15: 0.5 mg via INTRAVENOUS

## 2016-03-15 MED ORDER — SODIUM CHLORIDE 0.9 % IV SOLN
Freq: Once | INTRAVENOUS | Status: AC
  Administered 2016-03-15: 500 mL
  Filled 2016-03-15: qty 1.2

## 2016-03-15 MED ORDER — HYDROMORPHONE HCL 1 MG/ML IJ SOLN
INTRAMUSCULAR | Status: AC
Start: 2016-03-15 — End: 2016-03-16
  Filled 2016-03-15: qty 1

## 2016-03-15 MED ORDER — HYDROCODONE-ACETAMINOPHEN 5-325 MG PO TABS
1.0000 | ORAL_TABLET | Freq: Four times a day (QID) | ORAL | 0 refills | Status: DC | PRN
Start: 2016-03-15 — End: 2016-05-06

## 2016-03-15 MED ORDER — HEPARIN SOD (PORK) LOCK FLUSH 100 UNIT/ML IV SOLN
INTRAVENOUS | Status: AC
  Filled 2016-03-15: qty 5

## 2016-03-15 MED ORDER — LIDOCAINE 2% (20 MG/ML) 5 ML SYRINGE
INTRAMUSCULAR | Status: AC
  Filled 2016-03-15: qty 5

## 2016-03-15 MED ORDER — EPHEDRINE 5 MG/ML INJ
INTRAVENOUS | Status: AC
  Filled 2016-03-15: qty 10

## 2016-03-15 MED ORDER — PROPOFOL 10 MG/ML IV BOLUS
INTRAVENOUS | Status: DC | PRN
  Administered 2016-03-15: 200 mg via INTRAVENOUS

## 2016-03-15 MED ORDER — HEPARIN SOD (PORK) LOCK FLUSH 100 UNIT/ML IV SOLN
INTRAVENOUS | Status: DC | PRN
  Administered 2016-03-15: 400 [IU]

## 2016-03-15 SURGICAL SUPPLY — 30 items
BAG DECANTER FOR FLEXI CONT (MISCELLANEOUS) ×2 IMPLANT
BENZOIN TINCTURE PRP APPL 2/3 (GAUZE/BANDAGES/DRESSINGS) IMPLANT
BLADE SURG 15 STRL LF DISP TIS (BLADE) ×1 IMPLANT
BLADE SURG 15 STRL SS (BLADE) ×1
CHLORAPREP W/TINT 26ML (MISCELLANEOUS) ×2 IMPLANT
COVER PROBE U/S 5X48 (MISCELLANEOUS) IMPLANT
COVER SURGICAL LIGHT HANDLE (MISCELLANEOUS) ×2 IMPLANT
DECANTER SPIKE VIAL GLASS SM (MISCELLANEOUS) ×2 IMPLANT
DERMABOND ADVANCED (GAUZE/BANDAGES/DRESSINGS) ×1
DERMABOND ADVANCED .7 DNX12 (GAUZE/BANDAGES/DRESSINGS) ×1 IMPLANT
DRAPE C-ARM 42X120 X-RAY (DRAPES) ×2 IMPLANT
DRAPE LAPAROSCOPIC ABDOMINAL (DRAPES) ×2 IMPLANT
ELECT PENCIL ROCKER SW 15FT (MISCELLANEOUS) ×2 IMPLANT
ELECT REM PT RETURN 9FT ADLT (ELECTROSURGICAL) ×2
ELECTRODE REM PT RTRN 9FT ADLT (ELECTROSURGICAL) ×1 IMPLANT
GAUZE SPONGE 2X2 8PLY STRL LF (GAUZE/BANDAGES/DRESSINGS) IMPLANT
GAUZE SPONGE 4X4 16PLY XRAY LF (GAUZE/BANDAGES/DRESSINGS) ×2 IMPLANT
GLOVE SURG SIGNA 7.5 PF LTX (GLOVE) ×2 IMPLANT
GOWN STRL REUS W/TWL XL LVL3 (GOWN DISPOSABLE) ×4 IMPLANT
KIT BASIN OR (CUSTOM PROCEDURE TRAY) ×2 IMPLANT
KIT PORT POWER 8FR ISP CVUE (Catheter) ×2 IMPLANT
NEEDLE HYPO 25X1 1.5 SAFETY (NEEDLE) ×2 IMPLANT
PACK BASIC VI WITH GOWN DISP (CUSTOM PROCEDURE TRAY) ×2 IMPLANT
SPONGE GAUZE 2X2 STER 10/PKG (GAUZE/BANDAGES/DRESSINGS)
SUT MNCRL AB 4-0 PS2 18 (SUTURE) ×2 IMPLANT
SUT VIC AB 3-0 SH 18 (SUTURE) ×2 IMPLANT
SYR 10ML ECCENTRIC (SYRINGE) ×2 IMPLANT
SYR CONTROL 10ML LL (SYRINGE) ×2 IMPLANT
TOWEL OR 17X26 10 PK STRL BLUE (TOWEL DISPOSABLE) ×2 IMPLANT
TOWEL OR NON WOVEN STRL DISP B (DISPOSABLE) ×2 IMPLANT

## 2016-03-15 NOTE — Anesthesia Preprocedure Evaluation (Signed)
Anesthesia Evaluation    Airway Mallampati: II  TM Distance: >3 FB Neck ROM: Full    Dental no notable dental hx.    Pulmonary former smoker,    Pulmonary exam normal breath sounds clear to auscultation       Cardiovascular hypertension, Normal cardiovascular exam Rhythm:Regular Rate:Normal     Neuro/Psych PSYCHIATRIC DISORDERS Anxiety Depression TIA   GI/Hepatic   Endo/Other  diabetesHypothyroidism   Renal/GU      Musculoskeletal   Abdominal   Peds  Hematology   Anesthesia Other Findings   Reproductive/Obstetrics                             Anesthesia Physical Anesthesia Plan  ASA: III  Anesthesia Plan: General   Post-op Pain Management:    Induction: Intravenous  Airway Management Planned: LMA  Additional Equipment:   Intra-op Plan:   Post-operative Plan: Extubation in OR  Informed Consent: I have reviewed the patients History and Physical, chart, labs and discussed the procedure including the risks, benefits and alternatives for the proposed anesthesia with the patient or authorized representative who has indicated his/her understanding and acceptance.   Dental advisory given  Plan Discussed with: CRNA  Anesthesia Plan Comments:         Anesthesia Quick Evaluation

## 2016-03-15 NOTE — Anesthesia Postprocedure Evaluation (Signed)
Anesthesia Post Note  Patient: Marissa Hall  Procedure(s) Performed: Procedure(s) (LRB): INSERTION PORT-A-CATH WITH [place] (N/A)  Patient location during evaluation: PACU Anesthesia Type: General Level of consciousness: sedated and patient cooperative Pain management: pain level controlled Vital Signs Assessment: post-procedure vital signs reviewed and stable Respiratory status: spontaneous breathing Cardiovascular status: stable Anesthetic complications: no    Last Vitals:  Vitals:   03/15/16 1330 03/15/16 1337  BP:  (!) 150/75  Pulse: (!) 53 (!) 56  Resp: 12 12  Temp:  36.4 C    Last Pain:  Vitals:   03/15/16 1328  TempSrc:   PainSc: 2                  Nolon Nations

## 2016-03-15 NOTE — Interval H&P Note (Signed)
History and Physical Interval Note:  03/15/2016 11:24 AM  Marissa Hall  has presented today for surgery, with the diagnosis of RIGHT BREAST CANCER  The various methods of treatment have been discussed with the patient and family.   She is by herself.  A friend is going to take her home.  After consideration of risks, benefits and other options for treatment, the patient has consented to  Procedure(s): INSERTION PORT-A-CATH WITH [place] (N/A) as a surgical intervention .  The patient's history has been reviewed, patient examined, no change in status, stable for surgery.  I have reviewed the patient's chart and labs.  Questions were answered to the patient's satisfaction.     Michon Kaczmarek H

## 2016-03-15 NOTE — Op Note (Signed)
03/15/2016  1:23 PM  PATIENT:  Marissa Hall, 75 y.o., female MRN: [id number] DOB: August 15, 1940  PREOP DIAGNOSIS:  RIGHT BREAST CANCER, anticipate chemotherapy  POSTOP DIAGNOSIS:   Right breast cancer, anticipate chemotherapy  PROCEDURE:   Procedure(s):  Left subclavian INSERTION of power port  SURGEON:   Alphonsa Overall, M.D.  ANESTHESIA:   General  Anesthesiologist: Nolon Nations, MD CRNA: Lind Covert, CRNA  General  EBL:  minimal  ml  COUNTS CORRECT:  YES  INDICATIONS FOR PROCEDURE:  Marissa Hall is a 75 y.o. (DOB: [birth year]) white female whose primary care physician is CAMPBELL, STEPHEN D., MD and comes for power port placement for the treatment of right breast cancer.  Dr. Lindi Adie is her treating oncologist.   She was seen at the [facility] on 03/09/2016 with Drs. [place] and [place].   She has triple negative right breast cancer.  The plan is neoadjuvant chemotherapy, then plan surgery.  She underwent a MRI guided biopsy yesterday, which showed additional cancer in an enhanced area extending anteriorly from the right breast cancer.  This means the area is 6.4 in greatest measurement.   The indications and risks of the surgery were explained to the patient.  The risks include, but are not limited to, infection, bleeding, pneumothorax, nerve injury, and thrombosis of the vein.  OPERATIVE NOTE:  The patient was taken to OR room #6 at [facility].  Anesthesia was provided by Anesthesiologist: Nolon Nations, MD CRNA: Lind Covert, CRNA.  At the beginning of the operation, the patient was given 2 gm Ancef, had a roll placed under her back, and had the upper chest/neck prepped with Chloroprep and draped.   A time out was held and the surgery checklist reviewed.   The patient was placed in Trendelenburg position.  The left subclavian vein was accessed with a 16 gauge needle and a guide wire threaded through the needle into the vein.  The position of the wire was checked with  fluoroscopy.   I then developed a pocket in the upper inner aspect of the left chest for the port reservoir.  I used the [employer] for venous access.  The reservoir was sewn in place with a 3-0 Vicryl suture.  The reservoir had been flushed with dilute (10 units/cc) heparin.   I then passed the silastic tubing from the reservoir incision to the subclavian stick site and used the 8 French introducer to pass it into the vein.  The tip of the silastic catheter was position at the junction of the SVC and the right atrium under fluoroscopy.  The silastic catheter was then attached to the port with the bayonet device.     The entire port and tubing were checked with fluoroscopy and then the port was flushed with 4 cc of concentrated heparin (100 units/cc).   The wounds were then closed with 3-0 vicryl subcutaneous sutures and the skin closed with a 4-0 Monocryl suture.  The skin was painted with DermaBond.   The patient was transferred to the recovery room in good condition.  The sponge and needle count were correct at the end of the case.  A CXR is ordered for port placement and pending at the time of this note.  Alphonsa Overall, MD, [facility] Pager: [phone] Office phone:  [phone]

## 2016-03-15 NOTE — Discharge Instructions (Signed)
[facility] - DISCHARGE INSTRUCTIONS TO PATIENT  Activity:  Driving - May drive in one or two days, if doing well and off pain meds   Lifting - Take it easy for 5 days, then no limit  Wound Care:   Leave incision dry for 2 days, then may shower  Diet:  As tolerated  Follow up appointment:  Call Dr. Pollie Friar office [facility]) at [phone] for an appointment in 2 to 4 weeks  Medications and dosages:  Resume your home medications.  You have a prescription for:  Vicodin  Call Dr. Lucia Gaskins or his office  [phone]) if you have:  Temperature greater than 100.4,  Persistent nausea and vomiting,  Severe uncontrolled pain,  Redness, tenderness, or signs of infection (pain, swelling, redness, odor or green/yellow discharge around the site),  Difficulty breathing, headache or visual disturbances,  Any other questions or concerns you may have after discharge.  In an emergency, call 911 or go to an Emergency Department at a nearby hospital.

## 2016-03-15 NOTE — Transfer of Care (Signed)
Immediate Anesthesia Transfer of Care Note  Patient: Linnae Angiolillo  Procedure(s) Performed: Procedure(s): INSERTION PORT-A-CATH WITH [place] (N/A)  Patient Location: PACU  Anesthesia Type:General  Level of Consciousness: sedated  Airway & Oxygen Therapy: Patient Spontanous Breathing and Patient connected to face mask oxygen  Post-op Assessment: Report given to RN and Post -op Vital signs reviewed and stable  Post vital signs: Reviewed and stable  Last Vitals:  Vitals:   03/15/16 0848  BP: (!) 152/69  Pulse: (!) 57  Resp: 18  Temp: 36.6 C    Last Pain:  Vitals:   03/15/16 0848  TempSrc: Oral      Patients Stated Pain Goal: 3 ([id number])  Complications: No apparent anesthesia complications

## 2016-03-16 ENCOUNTER — Encounter (HOSPITAL_COMMUNITY): Payer: Self-pay | Admitting: Surgery

## 2016-03-16 ENCOUNTER — Other Ambulatory Visit: Payer: Medicare Other

## 2016-03-16 DIAGNOSIS — E119 Type 2 diabetes mellitus without complications: Secondary | ICD-10-CM | POA: Diagnosis not present

## 2016-03-16 DIAGNOSIS — Z9882 Breast implant status: Secondary | ICD-10-CM | POA: Diagnosis not present

## 2016-03-16 DIAGNOSIS — Z90710 Acquired absence of both cervix and uterus: Secondary | ICD-10-CM | POA: Diagnosis not present

## 2016-03-16 DIAGNOSIS — Z87891 Personal history of nicotine dependence: Secondary | ICD-10-CM | POA: Diagnosis not present

## 2016-03-16 DIAGNOSIS — I10 Essential (primary) hypertension: Secondary | ICD-10-CM | POA: Diagnosis not present

## 2016-03-16 DIAGNOSIS — C50211 Malignant neoplasm of upper-inner quadrant of right female breast: Secondary | ICD-10-CM | POA: Diagnosis not present

## 2016-03-16 LAB — CBC
HCT: 39.2 % (ref 36.0–46.0)
Hemoglobin: 13.4 g/dL (ref 12.0–15.0)
MCH: 32.3 pg (ref 26.0–34.0)
MCHC: 34.2 g/dL (ref 30.0–36.0)
MCV: 94.5 fL (ref 78.0–100.0)
Platelets: UNDETERMINED 10*3/uL (ref 150–400)
RBC: 4.15 MIL/uL (ref 3.87–5.11)
RDW: 13.8 % (ref 11.5–15.5)
WBC: 4.4 10*3/uL (ref 4.0–10.5)

## 2016-03-16 LAB — BASIC METABOLIC PANEL
Anion gap: 9 (ref 5–15)
BUN: 16 mg/dL (ref 6–20)
CO2: 28 mmol/L (ref 22–32)
Calcium: 9.8 mg/dL (ref 8.9–10.3)
Chloride: 103 mmol/L (ref 101–111)
Creatinine, Ser: 0.75 mg/dL (ref 0.44–1.00)
GFR calc Af Amer: >60 mL/min (ref >60)
GFR calc non Af Amer: >60 mL/min (ref >60)
Glucose, Bld: 131 mg/dL — ABNORMAL HIGH (ref 65–99)
Potassium: 4.8 mmol/L (ref 3.5–5.1)
Sodium: 140 mmol/L (ref 135–145)

## 2016-03-16 NOTE — Discharge Summary (Signed)
Physician Discharge Summary  Patient ID:  Marissa Hall  MRN: [id number]  DOB/AGE: Dec 01, 1940 75 y.o.  Admit date: 03/15/2016 Discharge date: 03/16/2016  Discharge Diagnoses:  1.  Kept overnight because of hospital/anesthesia protocol 2.  Right breast cancer 3. Anxiety 4. Bipolar disorder 5. DM - Sees Dr. Geoffery Lyons, endocrinology, [place] [facility]. Since 2008. She lost weight and came off meds for a while, but when she regained the weight and her DM is back 6. HTN since 2012 She had a tachy salvo condition in 2011 and saw a cardiologyst, she has not had any further problems since then 7. hypothyroid   Active Problems:   Breast cancer, right breast [facility])   Operation: Procedure(s): INSERTION Power port on 03/15/2016 - D. Lucia Gaskins  Discharged Condition: good  Hospital Course: Marissa Hall is an 75 y.o. female whose primary care physician is CAMPBELL, Estill Bamberg., MD and who was admitted 03/15/2016 with a chief complaint of hospital/anesthesia requirement.   She was brought to the operating room on 03/15/2016 and underwent  INSERTION Power port .   The discharge instructions were reviewed with the patient.  Consults: None  Significant Diagnostic Studies: Results for orders placed or performed during the hospital encounter of 03/15/16  Glucose, capillary  Result Value Ref Range   Glucose-Capillary 170 (H) 65 - 99 mg/dL   Comment 1 Notify RN   Glucose, capillary  Result Value Ref Range   Glucose-Capillary 128 (H) 65 - 99 mg/dL  CBC  Result Value Ref Range   WBC 4.4 4.0 - 10.5 K/uL   RBC 4.15 3.87 - 5.11 MIL/uL   Hemoglobin 13.4 12.0 - 15.0 g/dL   HCT 39.2 36.0 - 46.0 %   MCV 94.5 78.0 - 100.0 fL   MCH 32.3 26.0 - 34.0 pg   MCHC 34.2 30.0 - 36.0 g/dL   RDW 13.8 11.5 - 15.5 %   Platelets PLATELET CLUMPS NOTED ON SMEAR, UNABLE TO ESTIMATE 150 - 400 K/uL  Basic metabolic panel  Result Value Ref Range   Sodium 140 135 - 145 mmol/L   Potassium 4.8 3.5 - 5.1  mmol/L   Chloride 103 101 - 111 mmol/L   CO2 28 22 - 32 mmol/L   Glucose, Bld 131 (H) 65 - 99 mg/dL   BUN 16 6 - 20 mg/dL   Creatinine, Ser 0.75 0.44 - 1.00 mg/dL   Calcium 9.8 8.9 - 10.3 mg/dL   GFR calc non Af Amer >60 >60 mL/min   GFR calc Af Amer >60 >60 mL/min   Anion gap 9 5 - 15    Dg Chest 1 View  Result Date: 03/15/2016 CLINICAL DATA:  Postop Port-A-Cath placement. EXAM: CHEST 1 VIEW COMPARISON:  09/03/2011 FINDINGS: Left Port-A-Cath is in place with the tip in the SVC. No pneumothorax. Low lung volumes with bibasilar atelectasis and accentuation of heart size. No effusions or acute bony abnormality. IMPRESSION: Left Port-A-Cath placement with the tip in the SVC. No pneumothorax. Bibasilar atelectasis. Electronically Signed   By: Rolm Baptise M.D.   On: 03/15/2016 12:53   Mr Breast Bilateral W Wo Contrast  Result Date: 03/07/2016 CLINICAL DATA:  Biopsy proven malignancy in the upper inner right breast. Fibrocystic changes biopsied in the upper outer right breast. LABS:  Creatinine 0.8.  GFR 71. EXAM: BILATERAL BREAST MRI WITH AND WITHOUT CONTRAST TECHNIQUE: Multiplanar, multisequence MR images of both breasts were obtained prior to and following the intravenous administration of 16 ml of MultiHance. THREE-DIMENSIONAL MR IMAGE RENDERING ON INDEPENDENT  WORKSTATION: Three-dimensional MR images were rendered by post-processing of the original MR data on an independent workstation. The three-dimensional MR images were interpreted, and findings are reported in the following complete MRI report for this study. Three dimensional images were evaluated at the independent DynaCad workstation COMPARISON:  Previous exam(s). FINDINGS: Breast composition: Predominantly fatty tissue Background parenchymal enhancement: Mild Right breast: The patient's known malignancy in the upper inner right breast measures 3.5 cm. 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 The maximum extent of the probable DCIS is 3 cm. The maximum extent of known malignancy and probable DCIS is 6.4 cm. Mild enhancement medial to the known malignancy appears is thought to represent the tract of the second biopsy resulting in fibrocystic changes. No other suspicious findings in the right breast. There is an intracapsular rupture of the right breast implant. Left breast: No mass or abnormal enhancement. Lymph nodes: No abnormal appearing lymph nodes. Ancillary findings: Intracapsular rupture of the right implant. No definitive rupture of the left implant. IMPRESSION: 1. The patient's known malignancy measures approximately 3.5 cm. There is 3 cm of linear enhancement extending anterior to the known malignancy spanning approximately 3 cm. This linear enhancement correlates with suspicious calcifications seen mammographically and is highly suspicious for DCIS. Including the linear enhancement, the total extent of disease would be approximately 6.4 cm. 2. No malignancy in the left breast identified. RECOMMENDATION: The linear enhancement described above, highly suspicious for DCIS, could be biopsied under MRI guidance if breast conservation is being considered. BI-RADS CATEGORY  4: Suspicious. Electronically Signed   By: Dorise Bullion III M.D   On: 03/07/2016 11:23   Mm Digital Diagnostic Unilat R  Result Date: 03/14/2016 CLINICAL DATA:  MRI-guided biopsy was performed of the linear area of enhancement in the upper inner quadrant of the right breast extending anteriorly from the biopsy-proven carcinoma in the upper inner quadrant. EXAM: DIAGNOSTIC RIGHT MAMMOGRAM POST MRI BIOPSY COMPARISON:  Previous exam(s) from [facility], and today's breast MRI biopsy images. FINDINGS: Mammographic images were obtained following MRI guided biopsy of linear enhancement in the upper inner quadrant of the right breast, anterior to the recently  biopsied suspicious and palpable mass. A dumbbell-shaped biopsy clip is satisfactorily positioned in the upper inner quadrant of the right breast in the expected location of today's MRI guided biopsy. This biopsy clip is approximately 3 mm directly medial to the anterior extent of the suspicious calcifications in the upper inner quadrant. The biopsy clip placed today (dumbbell-shaped) is 3 cm anterior and inferior to the biopsy clip positioned within the recently biopsied palpable mass. IMPRESSION: Satisfactory position of dumbbell-shaped biopsy clip in the upper inner quadrant of the right breast. Final Assessment: Post Procedure Mammograms for Marker Placement Electronically Signed   By: Curlene Dolphin M.D.   On: 03/14/2016 11:46   Dg C-arm 1-60 Min-no Report  Result Date: 03/15/2016 CLINICAL DATA: surgery C-ARM 1-60 MINUTES Fluoroscopy was utilized by the requesting physician.  No radiographic interpretation.   Mr Rt Breast Bx Johnella Moloney Dev 1st Lesion Image Bx Spec Mr Guide  Result Date: 03/14/2016 CLINICAL DATA:  Recent diagnosis of right breast cancer following biopsy of a palpable suspicious mass in the upper inner quadrant. A breast MRI was subsequently performed, demonstrating approximately 3 cm of linear enhancement extending inferior and inferior to the biopsied mass. MRI guided biopsy is requested of the linear enhancement. EXAM: MRI GUIDED  CORE NEEDLE BIOPSY OF THE RIGHT BREAST TECHNIQUE: Multiplanar, multisequence MR imaging of the right breast was performed both before and after administration of intravenous contrast. CONTRAST:  87m MULTIHANCE GADOBENATE DIMEGLUMINE 529 MG/ML IV SOLN COMPARISON:  [facility] FINDINGS: I met with the patient, and we discussed the procedure of MRI guided biopsy, including risks, benefits, and alternatives. Specifically, we discussed the risks of infection, bleeding, tissue injury, clip migration, and  inadequate sampling. Informed, written consent was given. The usual time out protocol was performed immediately prior to the procedure. Using sterile technique, 1% Lidocaine, MRI guidance, and a 9 gauge vacuum assisted device, biopsy was performed of linear enhancement in the upper inner quadrant of the right breast using a medial approach. At the conclusion of the procedure, a tissue marker clip was deployed into the biopsy cavity. Follow-up 2-view mammogram was performed and dictated separately. IMPRESSION: MRI guided biopsy of the right breast. No apparent complications. Electronically Signed   By: SCurlene DolphinM.D.   On: 03/14/2016 09:33    Discharge Exam:  Vitals:   03/16/16 0156 03/16/16 0627  BP: (!) 108/55 (!) 152/67  Pulse: 82 64  Resp: 16 16  Temp: 97.6 F (36.4 C) 97.8 F (36.6 C)    General: WN older WF who is alert and generally healthy appearing.  Lungs: Clear to auscultation and symmetric breath sounds. Left chest - incision looks good  Discharge Medications:     Medication List    TAKE these medications   atorvastatin 80 MG tablet Commonly known as:  LIPITOR Take 80 mg by mouth every evening.   azelastine 0.1 % nasal spray Commonly known as:  ASTELIN Place 2 sprays into both nostrils daily as needed for rhinitis or allergies. Use in each nostril as directed   gabapentin 100 MG capsule Commonly known as:  NEURONTIN Take 200 mg by mouth at bedtime.   HYDROcodone-acetaminophen 5-325 MG tablet Commonly known as:  NORCO/VICODIN Take 1-2 tablets by mouth every 6 (six) hours as needed.   levothyroxine 100 MCG tablet Commonly known as:  SYNTHROID, LEVOTHROID Take 100 mcg by mouth daily before breakfast.   lisinopril 20 MG tablet Commonly known as:  PRINIVIL,ZESTRIL Take 20 mg by mouth every evening.   LORazepam 1 MG tablet Commonly known as:  ATIVAN Take 0.5 mg by mouth 2 (two) times daily.   metFORMIN 500 MG 24 hr tablet Commonly known as:   GLUCOPHAGE-XR Take 1,000 mg by mouth daily with supper.   Vitamin D (Ergocalciferol) 50000 units Caps capsule Commonly known as:  DRISDOL Take 50,000 Units by mouth every 7 (seven) days. Saturdays       Disposition: 01-Home or Self Care  Discharge Instructions    Diet - low sodium heart healthy    Complete by:  As directed    Diet - low sodium heart healthy    Complete by:  As directed    Increase activity slowly    Complete by:  As directed    Increase activity slowly    Complete by:  As directed          Signed: DAlphonsa Overall M.D., FA[facility] Office:  [phone] 03/16/2016, 8:22 AM

## 2016-03-24 ENCOUNTER — Other Ambulatory Visit: Payer: Self-pay

## 2016-03-24 DIAGNOSIS — C50211 Malignant neoplasm of upper-inner quadrant of right female breast: Secondary | ICD-10-CM

## 2016-03-25 ENCOUNTER — Other Ambulatory Visit (HOSPITAL_COMMUNITY): Payer: Medicare Other

## 2016-03-25 ENCOUNTER — Ambulatory Visit: Payer: Medicare Other | Admitting: Hematology and Oncology

## 2016-03-25 ENCOUNTER — Other Ambulatory Visit: Payer: Self-pay | Admitting: *Deleted

## 2016-03-25 ENCOUNTER — Encounter: Payer: Self-pay | Admitting: *Deleted

## 2016-03-25 ENCOUNTER — Encounter (HOSPITAL_COMMUNITY): Payer: Self-pay | Admitting: Internal Medicine

## 2016-03-25 ENCOUNTER — Ambulatory Visit (HOSPITAL_BASED_OUTPATIENT_CLINIC_OR_DEPARTMENT_OTHER)
Admission: RE | Admit: 2016-03-25 | Discharge: 2016-03-25 | Disposition: A | Discharge status: Home / Self Care | Payer: Medicare Other | Source: Ambulatory Visit | Attending: Internal Medicine | Admitting: Internal Medicine

## 2016-03-25 ENCOUNTER — Ambulatory Visit (HOSPITAL_COMMUNITY)
Admission: RE | Admit: 2016-03-25 | Discharge: 2016-03-25 | Disposition: A | Discharge status: Home / Self Care | Payer: Medicare Other | Source: Ambulatory Visit | Attending: Internal Medicine | Admitting: Internal Medicine

## 2016-03-25 VITALS — BP 140/90 | HR 71 | Ht 69.0 in | Wt 180.4 lb

## 2016-03-25 DIAGNOSIS — Z7984 Long term (current) use of oral hypoglycemic drugs: Secondary | ICD-10-CM | POA: Insufficient documentation

## 2016-03-25 DIAGNOSIS — E039 Hypothyroidism, unspecified: Secondary | ICD-10-CM | POA: Insufficient documentation

## 2016-03-25 DIAGNOSIS — Z171 Estrogen receptor negative status [ER-]: Secondary | ICD-10-CM

## 2016-03-25 DIAGNOSIS — Z880 Allergy status to penicillin: Secondary | ICD-10-CM | POA: Insufficient documentation

## 2016-03-25 DIAGNOSIS — Z8673 Personal history of transient ischemic attack (TIA), and cerebral infarction without residual deficits: Secondary | ICD-10-CM | POA: Insufficient documentation

## 2016-03-25 DIAGNOSIS — Z79899 Other long term (current) drug therapy: Secondary | ICD-10-CM | POA: Insufficient documentation

## 2016-03-25 DIAGNOSIS — Z8249 Family history of ischemic heart disease and other diseases of the circulatory system: Secondary | ICD-10-CM | POA: Diagnosis not present

## 2016-03-25 DIAGNOSIS — C50211 Malignant neoplasm of upper-inner quadrant of right female breast: Secondary | ICD-10-CM | POA: Insufficient documentation

## 2016-03-25 DIAGNOSIS — C50011 Malignant neoplasm of nipple and areola, right female breast: Secondary | ICD-10-CM | POA: Diagnosis not present

## 2016-03-25 DIAGNOSIS — Z87891 Personal history of nicotine dependence: Secondary | ICD-10-CM | POA: Diagnosis not present

## 2016-03-25 DIAGNOSIS — I10 Essential (primary) hypertension: Secondary | ICD-10-CM | POA: Diagnosis not present

## 2016-03-25 DIAGNOSIS — E119 Type 2 diabetes mellitus without complications: Secondary | ICD-10-CM | POA: Insufficient documentation

## 2016-03-25 DIAGNOSIS — Z8679 Personal history of other diseases of the circulatory system: Secondary | ICD-10-CM | POA: Insufficient documentation

## 2016-03-25 MED ORDER — ONDANSETRON HCL 8 MG PO TABS: 8.0000 mg | ORAL_TABLET | Freq: Two times a day (BID) | ORAL | 1 refills | Status: DC | PRN

## 2016-03-25 MED ORDER — LIDOCAINE-PRILOCAINE 2.5-2.5 % EX CREA: TOPICAL_CREAM | CUTANEOUS | 3 refills | Status: DC

## 2016-03-25 MED ORDER — PROCHLORPERAZINE MALEATE 10 MG PO TABS: 10.0000 mg | ORAL_TABLET | Freq: Four times a day (QID) | ORAL | 1 refills | Status: DC | PRN

## 2016-03-25 MED ORDER — LORAZEPAM 0.5 MG PO TABS: 0.5000 mg | ORAL_TABLET | Freq: Four times a day (QID) | ORAL | 0 refills | Status: DC | PRN

## 2016-03-25 NOTE — Patient Instructions (Signed)
Your physician has requested that you have an echocardiogram. Echocardiography is a painless test that uses sound waves to create images of your heart. It provides your doctor with information about the size and shape of your heart and how well your heart's chambers and valves are working. This procedure takes approximately one hour. There are no restrictions for this procedure.  Your physician recommends that you schedule a follow-up appointment in: 6 weeks with Dr Osker Mason

## 2016-03-25 NOTE — Progress Notes (Signed)
  Echocardiogram 2D Echocardiogram has been performed.  Marissa Hall 03/25/2016, 12:02 PM

## 2016-03-25 NOTE — Progress Notes (Signed)
[facility] NOTE  Patient Care Team: Estill Bamberg. [name], MD as PCP - General (Internal Medicine) Alphonsa Overall, MD as Consulting Physician (General Surgery) Nicholas Lose, MD as Consulting Physician (Hematology and Oncology) Kyung Rudd, MD as Consulting Physician (Radiation Oncology)    HISTORY OF PRESENTING ILLNESS:  Marissa Hall 75 y.o. 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 Referred by Dr. Lindi Adie for enrollment into the cardio-oncology clinic in setting of right breast cancer.  Recently moved from [place]. 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 Caths with normal coronary arteries.    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 This however was not biopsied.   Scheduled to start chemotherapy on Monday with AC regimen.  Doing well. No CP or DOE. No edema.   Echo today reviewed personally: EF 60-65% Grade I DD Lateral s' 12.1 cm/s GLS 16.7%  SUMMARY OF ONCOLOGIC HISTORY:   Breast cancer of upper-inner quadrant of right female breast ([place])   02/29/2016 Initial Diagnosis    Right breast palpable mass (with silicone implants 0932), 3.5 cm on MRI, additional 3 cm anterior linear enhancement? 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 Ki-67 60%, T2 N0 stage 2A clinical stage      03/14/2016 Procedure    Right breast biopsy upper inner quadrant: IDC grade 3      MEDICAL HISTORY:  Past Medical History:  Diagnosis Date  . Anxiety   . Depression   . Diabetes ([place])   . Hypertension   . Hypothyroidism   . TIA (transient ischemic attack)     SURGICAL HISTORY: Past Surgical  History:  Procedure Laterality Date  . ABDOMINAL HYSTERECTOMY  1972  . APPENDECTOMY  1972  . BREAST ENHANCEMENT SURGERY  1982  . BREAST SURGERY     breast biopsy  . PORTACATH PLACEMENT N/A 03/15/2016   Procedure: INSERTION PORT-A-CATH WITH [place];  Surgeon: Alphonsa Overall, MD;  Location: [facility] ORS;  Service: General;  Laterality: N/A;  . surgical repair left ankle  2009    SOCIAL HISTORY: Social History   Social History  . Marital status: Legally Separated    Spouse name: N/A  . Number of children: N/A  . Years of education: N/A   Occupational History  . Not on file.   Social History Main Topics  . Smoking status: Former Smoker    Packs/day: 1.00    Types: Cigarettes    Quit date: 03/08/1999  . Smokeless tobacco: Never Used  . Alcohol use Yes     Comment: wine daily  . Drug use: No  . Sexual activity: Not on file   Other Topics Concern  . Not on file   Social History Narrative  . No narrative on file    FAMILY HISTORY: Father died suicide Mother died of CVA and PNA  No FHx of premature CAD  ALLERGIES:  is allergic to other and penicillins.  MEDICATIONS:  Current Outpatient Prescriptions  Medication Sig Dispense Refill  . atorvastatin (LIPITOR) 80 MG tablet Take 80 mg by mouth every evening.     [name] Kitchen azelastine (  ASTELIN) 0.1 % nasal spray Place 2 sprays into both nostrils daily as needed for rhinitis or allergies. Use in each nostril as directed    . gabapentin (NEURONTIN) 100 MG capsule Take 200 mg by mouth at bedtime.    [name] Kitchen levothyroxine (SYNTHROID, LEVOTHROID) 100 MCG tablet Take 100 mcg by mouth daily before breakfast.     . lisinopril (PRINIVIL,ZESTRIL) 20 MG tablet Take 20 mg by mouth every evening.     [name] Kitchen LORazepam (ATIVAN) 0.5 MG tablet Take 1 tablet (0.5 mg total) by mouth every 6 (six) hours as needed (Nausea or vomiting). 30 tablet 0  . LORazepam (ATIVAN) 1 MG tablet Take 0.5 mg by mouth 2 (two) times daily.     . metFORMIN (GLUCOPHAGE-XR) 500 MG 24 hr tablet  Take 1,000 mg by mouth daily with supper.     . Vitamin D, Ergocalciferol, (DRISDOL) 50000 units CAPS capsule Take 50,000 Units by mouth every 7 (seven) days. Saturdays    . HYDROcodone-acetaminophen (NORCO/VICODIN) 5-325 MG tablet Take 1-2 tablets by mouth every 6 (six) hours as needed. (Patient not taking: Reported on 03/25/2016) 20 tablet 0  . lidocaine-prilocaine (EMLA) cream Apply to affected area once (Patient not taking: Reported on 03/25/2016) 30 g 3  . ondansetron (ZOFRAN) 8 MG tablet Take 1 tablet (8 mg total) by mouth 2 (two) times daily as needed. Start on the third day after chemotherapy. (Patient not taking: Reported on 03/25/2016) 30 tablet 1  . prochlorperazine (COMPAZINE) 10 MG tablet Take 1 tablet (10 mg total) by mouth every 6 (six) hours as needed (Nausea or vomiting). (Patient not taking: Reported on 03/25/2016) 30 tablet 1   No current facility-administered medications for this encounter.     REVIEW OF SYSTEMS:   Constitutional: Denies fevers, chills or abnormal night sweats Eyes: Denies blurriness of vision, double vision or watery eyes Ears, nose, mouth, throat, and face: Denies mucositis or sore throat Respiratory: Denies cough, dyspnea or wheezes Cardiovascular: Denies palpitation, chest discomfort or lower extremity swelling Gastrointestinal:  Denies nausea, heartburn or change in bowel habits Skin: Denies abnormal skin rashes Lymphatics: Denies new lymphadenopathy or easy bruising Neurological:Denies numbness, tingling or new weaknesses Behavioral/Psych: Mood is stable, no new changes  Breast: Palpable lump in the right breast All other systems were reviewed with the patient and are negative.    Vitals:   03/25/16 1214  BP: 140/90  Pulse: 71   Filed Weights   03/25/16 1214  Weight: 180 lb 6.4 oz (81.8 kg)   Physical Exam:  GENERAL:alert, no distress and comfortable EYES: normal, conjunctiva are pink and non-injected, sclera clear OROPHARYNX:no  exudate, no erythema and lips, buccal mucosa, and tongue normal  NECK: supple, thyroid normal size, non-tender, without nodularity LYMPH:  no palpable lymphadenopathy in the cervical, axillary or inguinal LUNGS: clear to auscultation and percussion with normal breathing effort HEART: regular rate & rhythm soft SEM at RUSB  and no lower extremity edema ABDOMEN: obsese soft, non-tender and normal bowel sounds Musculoskeletal:no cyanosis of digits and no clubbing  PSYCH: alert & oriented x 3 with fluent speech NEURO: no focal motor/sensory deficits  LABORATORY DATA:  I have reviewed the data as listed Lab Results  Component Value Date   WBC 4.4 03/16/2016   HGB 13.4 03/16/2016   HCT 39.2 03/16/2016   MCV 94.5 03/16/2016   PLT PLATELET CLUMPS NOTED ON SMEAR, UNABLE TO ESTIMATE 03/16/2016   Lab Results  Component Value Date   NA 140 03/16/2016   K  4.8 03/16/2016   CL 103 03/16/2016   CO2 28 03/16/2016     ASSESSMENT AND PLAN:  1. Right breast CA --triple negative --scheduled for [facility] chemotherapy followed surgic resection and XRT as needd. --I reviewed echos personally. EF and Doppler parameters stable. No HF on exam. Will see back in 6 months for repeat echo to ensure stability. I explained incidence of Adriamycin cardiotoxicity in detail include small possibility of very delayed toxicity. Will repeat echo in 6 weeks.   2. H/o chest pain and Tako-Tsubo CM --Cardiac cath in 200 and 2011 with normal coronary in [place], [place]  --Echo now normal. --Will continue lisinopril. Low threshold to add carvedilol in the future.   3.HTN --BP a little high today. Will follow can titrate meds as needed.     Glori Bickers, MD 03/09/16

## 2016-03-25 NOTE — Progress Notes (Deleted)
Patient Care Team: Estill Bamberg. [name], MD as PCP - General (Internal Medicine) Alphonsa Overall, MD as Consulting Physician (General Surgery) Nicholas Lose, MD as Consulting Physician (Hematology and Oncology) Kyung Rudd, MD as Consulting Physician (Radiation Oncology)  DIAGNOSIS:  Encounter Diagnosis  Name Primary?  . Malignant neoplasm of upper-inner quadrant of right breast in female, estrogen receptor negative ([employer])     SUMMARY OF ONCOLOGIC HISTORY:   Breast cancer of upper-inner quadrant of right female breast ([place])   02/29/2016 Initial Diagnosis    Right breast palpable mass (with silicone implants 1638), 3.5 cm on MRI, additional 3 cm anterior linear enhancement? 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 Ki-67 60%, T2 N0 stage 2A clinical stage       CHIEF COMPLIANT:   INTERVAL HISTORY: Marissa Hall is a     REVIEW OF SYSTEMS:   Constitutional: Denies fevers, chills or abnormal weight loss Eyes: Denies blurriness of vision Ears, nose, mouth, throat, and face: Denies mucositis or sore throat Respiratory: Denies cough, dyspnea or wheezes Cardiovascular: Denies palpitation, chest discomfort Gastrointestinal:  Denies nausea, heartburn or change in bowel habits Skin: Denies abnormal skin rashes Lymphatics: Denies new lymphadenopathy or easy bruising Neurological:Denies numbness, tingling or new weaknesses Behavioral/Psych: Mood is stable, no new changes  Extremities: No lower extremity edema Breast: *** denies any pain or lumps or nodules in either breasts All other systems were reviewed with the patient and are negative.  I have reviewed the past medical history, past surgical history, social history and family history with the patient and they are unchanged from previous note.  ALLERGIES:  is allergic to other and penicillins.  MEDICATIONS:  Current Outpatient Prescriptions  Medication Sig Dispense Refill  . atorvastatin (LIPITOR) 80 MG tablet Take 80 mg  by mouth every evening.     [name] Kitchen azelastine (ASTELIN) 0.1 % nasal spray Place 2 sprays into both nostrils daily as needed for rhinitis or allergies. Use in each nostril as directed    . gabapentin (NEURONTIN) 100 MG capsule Take 200 mg by mouth at bedtime.    [name] Kitchen HYDROcodone-acetaminophen (NORCO/VICODIN) 5-325 MG tablet Take 1-2 tablets by mouth every 6 (six) hours as needed. 20 tablet 0  . levothyroxine (SYNTHROID, LEVOTHROID) 100 MCG tablet Take 100 mcg by mouth daily before breakfast.     . lisinopril (PRINIVIL,ZESTRIL) 20 MG tablet Take 20 mg by mouth every evening.     [name] Kitchen LORazepam (ATIVAN) 1 MG tablet Take 0.5 mg by mouth 2 (two) times daily.     . metFORMIN (GLUCOPHAGE-XR) 500 MG 24 hr tablet Take 1,000 mg by mouth daily with supper.     . Vitamin D, Ergocalciferol, (DRISDOL) 50000 units CAPS capsule Take 50,000 Units by mouth every 7 (seven) days. Saturdays     No current facility-administered medications for this visit.     PHYSICAL EXAMINATION: ECOG PERFORMANCE STATUS: {CHL ONC ECOG PS:[phone]}  There were no vitals filed for this visit. There were no vitals filed for this visit.  GENERAL:alert, no distress and comfortable SKIN: skin color, texture, turgor are normal, no rashes or significant lesions EYES: normal, Conjunctiva are pink and non-injected, sclera clear OROPHARYNX:no exudate, no erythema and lips, buccal mucosa, and tongue normal  NECK: supple, thyroid normal size, non-tender, without nodularity LYMPH:  no palpable lymphadenopathy in the cervical, axillary or inguinal LUNGS: clear to auscultation and percussion with normal breathing effort HEART: regular rate & rhythm and no murmurs and no lower extremity edema ABDOMEN:abdomen soft, non-tender and  normal bowel sounds MUSCULOSKELETAL:no cyanosis of digits and no clubbing  NEURO: alert & oriented x 3 with fluent speech, no focal motor/sensory deficits EXTREMITIES: No lower extremity edema BREAST:*** No palpable masses  or nodules in either right or left breasts. No palpable axillary supraclavicular or infraclavicular adenopathy no breast tenderness or nipple discharge. (exam performed in the presence of a chaperone)  LABORATORY DATA:  I have reviewed the data as listed   Chemistry      Component Value Date/Time   NA 140 03/16/2016 0506   NA 139 03/09/2016 0846   K 4.8 03/16/2016 0506   K 4.8 03/09/2016 0846   CL 103 03/16/2016 0506   CO2 28 03/16/2016 0506   CO2 23 03/09/2016 0846   BUN 16 03/16/2016 0506   BUN 21.7 03/09/2016 0846   CREATININE 0.75 03/16/2016 0506   CREATININE 0.9 03/09/2016 0846      Component Value Date/Time   CALCIUM 9.8 03/16/2016 0506   CALCIUM 9.4 03/09/2016 0846   ALKPHOS 75 03/09/2016 0846   AST 32 03/09/2016 0846   ALT 42 03/09/2016 0846   BILITOT 0.91 03/09/2016 0846       Lab Results  Component Value Date   WBC 4.4 03/16/2016   HGB 13.4 03/16/2016   HCT 39.2 03/16/2016   MCV 94.5 03/16/2016   PLT PLATELET CLUMPS NOTED ON SMEAR, UNABLE TO ESTIMATE 03/16/2016   NEUTROABS 2.2 03/09/2016     ASSESSMENT & PLAN:  No problem-specific Assessment & Plan notes found for this encounter.   No orders of the defined types were placed in this encounter.  The patient has a good understanding of the overall plan. she agrees with it. she will call with any problems that may develop before the next visit here.   Rulon Eisenmenger, MD 03/25/16

## 2016-03-25 NOTE — Assessment & Plan Note (Deleted)
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   T2 N0 stage 2A clinical stage  Recommendation: 1. Neoadjuvant chemotherapy with dose dense Adriamycin and Cytoxan 4 followed by Abraxane weekly 12  (patient is diabetic and cannot take steroids) 2. followed by breast conserving surgery (with plastic surgery help regarding implant rupture) 3. Followed by radiation therapy -----------------------------------------------------------------------------------------------------------------------------------------------------------

## 2016-03-28 ENCOUNTER — Telehealth: Payer: Self-pay | Admitting: *Deleted

## 2016-03-28 ENCOUNTER — Other Ambulatory Visit (HOSPITAL_BASED_OUTPATIENT_CLINIC_OR_DEPARTMENT_OTHER): Payer: Medicare Other

## 2016-03-28 ENCOUNTER — Telehealth: Payer: Self-pay | Admitting: Hematology and Oncology

## 2016-03-28 ENCOUNTER — Ambulatory Visit (HOSPITAL_BASED_OUTPATIENT_CLINIC_OR_DEPARTMENT_OTHER): Payer: Medicare Other | Admitting: Hematology and Oncology

## 2016-03-28 ENCOUNTER — Ambulatory Visit (HOSPITAL_BASED_OUTPATIENT_CLINIC_OR_DEPARTMENT_OTHER): Payer: Medicare Other

## 2016-03-28 ENCOUNTER — Encounter: Payer: Self-pay | Admitting: *Deleted

## 2016-03-28 DIAGNOSIS — C50211 Malignant neoplasm of upper-inner quadrant of right female breast: Secondary | ICD-10-CM

## 2016-03-28 DIAGNOSIS — E119 Type 2 diabetes mellitus without complications: Secondary | ICD-10-CM | POA: Diagnosis not present

## 2016-03-28 DIAGNOSIS — Z5111 Encounter for antineoplastic chemotherapy: Secondary | ICD-10-CM

## 2016-03-28 DIAGNOSIS — Z171 Estrogen receptor negative status [ER-]: Secondary | ICD-10-CM | POA: Diagnosis not present

## 2016-03-28 LAB — CBC WITH DIFFERENTIAL/PLATELET
BASO%: 1.2 % (ref 0.0–2.0)
BASOS ABS: 0 10*3/uL (ref 0.0–0.1)
EOS ABS: 0.1 10*3/uL (ref 0.0–0.5)
EOS%: 3.2 % (ref 0.0–7.0)
HEMATOCRIT: 42 % (ref 34.8–46.6)
HEMOGLOBIN: 13.8 g/dL (ref 11.6–15.9)
LYMPH%: 35.6 % (ref 14.0–49.7)
MCH: 32 pg (ref 25.1–34.0)
MCHC: 32.9 g/dL (ref 31.5–36.0)
MCV: 97.2 fL (ref 79.5–101.0)
MONO#: 0.3 10*3/uL (ref 0.1–0.9)
MONO%: 8.1 % (ref 0.0–14.0)
NEUT#: 2.1 10*3/uL (ref 1.5–6.5)
NEUT%: 51.9 % (ref 38.4–76.8)
Platelets: 187 10*3/uL (ref 145–400)
RBC: 4.32 10*6/uL (ref 3.70–5.45)
RDW: 13.9 % (ref 11.2–14.5)
WBC: 4.1 10*3/uL (ref 3.9–10.3)
lymph#: 1.5 10*3/uL (ref 0.9–3.3)

## 2016-03-28 LAB — COMPREHENSIVE METABOLIC PANEL
ALBUMIN: 4 g/dL (ref 3.5–5.0)
ALK PHOS: 87 U/L (ref 40–150)
ALT: 60 U/L — ABNORMAL HIGH (ref 0–55)
AST: 53 U/L — ABNORMAL HIGH (ref 5–34)
Anion Gap: 10 mEq/L (ref 3–11)
BUN: 18 mg/dL (ref 7.0–26.0)
CALCIUM: 9.6 mg/dL (ref 8.4–10.4)
CHLORIDE: 105 meq/L (ref 98–109)
CO2: 23 mEq/L (ref 22–29)
Creatinine: 0.9 mg/dL (ref 0.6–1.1)
EGFR: 62 mL/min/{1.73_m2} — AB (ref >90)
Glucose: 272 mg/dl — ABNORMAL HIGH (ref 70–140)
POTASSIUM: 4.8 meq/L (ref 3.5–5.1)
Sodium: 138 mEq/L (ref 136–145)
Total Bilirubin: 0.83 mg/dL (ref 0.20–1.20)
Total Protein: 7.4 g/dL (ref 6.4–8.3)

## 2016-03-28 MED ORDER — PALONOSETRON HCL INJECTION 0.25 MG/5ML
0.2500 mg | Freq: Once | INTRAVENOUS | Status: AC
  Administered 2016-03-28: 0.25 mg via INTRAVENOUS

## 2016-03-28 MED ORDER — CYCLOPHOSPHAMIDE CHEMO INJECTION 1 GM
600.0000 mg/m2 | Freq: Once | INTRAMUSCULAR | Status: AC
  Administered 2016-03-28: 1200 mg via INTRAVENOUS
  Filled 2016-03-28: qty 60

## 2016-03-28 MED ORDER — SODIUM CHLORIDE 0.9 % IV SOLN
Freq: Once | INTRAVENOUS | Status: AC
  Administered 2016-03-28: 11:00:00 via INTRAVENOUS

## 2016-03-28 MED ORDER — PEGFILGRASTIM 6 MG/0.6ML ~~LOC~~ PSKT
6.0000 mg | PREFILLED_SYRINGE | Freq: Once | SUBCUTANEOUS | Status: AC
  Administered 2016-03-28: 6 mg via SUBCUTANEOUS
  Filled 2016-03-28: qty 0.6

## 2016-03-28 MED ORDER — DOXORUBICIN HCL CHEMO IV INJECTION 2 MG/ML
60.0000 mg/m2 | Freq: Once | INTRAVENOUS | Status: AC
  Administered 2016-03-28: 120 mg via INTRAVENOUS
  Filled 2016-03-28: qty 60

## 2016-03-28 MED ORDER — HEPARIN SOD (PORK) LOCK FLUSH 100 UNIT/ML IV SOLN
500.0000 [IU] | Freq: Once | INTRAVENOUS | Status: AC | PRN
  Administered 2016-03-28: 500 [IU]
  Filled 2016-03-28: qty 5

## 2016-03-28 MED ORDER — PALONOSETRON HCL INJECTION 0.25 MG/5ML
INTRAVENOUS | Status: AC
  Filled 2016-03-28: qty 5

## 2016-03-28 MED ORDER — SODIUM CHLORIDE 0.9 % IV SOLN
Freq: Once | INTRAVENOUS | Status: AC
  Administered 2016-03-28: 11:00:00 via INTRAVENOUS
  Filled 2016-03-28: qty 5

## 2016-03-28 MED ORDER — SODIUM CHLORIDE 0.9% FLUSH
10.0000 mL | INTRAVENOUS | Status: DC | PRN
  Administered 2016-03-28: 10 mL
  Filled 2016-03-28: qty 10

## 2016-03-28 NOTE — Telephone Encounter (Signed)
FYI "I was told the medicine for nausea could cause  Headache.  I have a headache to my forehead.  The summary says not to take tylenol or ibuprofen so what do I take.  Today my liver enzymes were up."   Ibuprofen okay for use.  Does admit to history of a type of migraine headaches in the past.

## 2016-03-28 NOTE — Telephone Encounter (Signed)
No 10/23 los/orders/referrals. Patient given avs report and existing appointments for October thru December.

## 2016-03-28 NOTE — Progress Notes (Signed)
Blood return noted before, every 3cc and after Adriamycin push.  Pt tolerated treatment well. Pt stable at discharge.

## 2016-03-28 NOTE — Patient Instructions (Signed)
[facility] Discharge Instructions for Patients Receiving Chemotherapy  Today you received the following chemotherapy agents: Adriamycin and cytoxan   To help prevent nausea and vomiting after your treatment, we encourage you to take your nausea medication as directed.    If you develop nausea and vomiting that is not controlled by your nausea medication, call the clinic.   BELOW ARE SYMPTOMS THAT SHOULD BE REPORTED IMMEDIATELY:  *FEVER GREATER THAN 100.5 F  *CHILLS WITH OR WITHOUT FEVER  NAUSEA AND VOMITING THAT IS NOT CONTROLLED WITH YOUR NAUSEA MEDICATION  *UNUSUAL SHORTNESS OF BREATH  *UNUSUAL BRUISING OR BLEEDING  TENDERNESS IN MOUTH AND THROAT WITH OR WITHOUT PRESENCE OF ULCERS  *URINARY PROBLEMS  *BOWEL PROBLEMS  UNUSUAL RASH Items with * indicate a potential emergency and should be followed up as soon as possible.  Feel free to call the clinic you have any questions or concerns. The clinic phone number is [phone].  Please show the [facility] at check-in to the Emergency Department and triage nurse.   Doxorubicin injection What is this medicine? DOXORUBICIN (dox oh ROO bi sin) is a chemotherapy drug. It is used to treat many kinds of cancer like Hodgkin's disease, leukemia, non-Hodgkin's lymphoma, neuroblastoma, sarcoma, and Wilms' tumor. It is also used to treat bladder cancer, breast cancer, lung cancer, ovarian cancer, stomach cancer, and thyroid cancer. This medicine may be used for other purposes; ask your health care provider or pharmacist if you have questions. What should I tell my health care provider before I take this medicine? They need to know if you have any of these conditions: -blood disorders -heart disease, recent heart attack -infection (especially a virus infection such as chickenpox, cold sores, or herpes) -irregular heartbeat -liver disease -recent or ongoing radiation therapy -an unusual or allergic reaction to  doxorubicin, other chemotherapy agents, other medicines, foods, dyes, or preservatives -pregnant or trying to get pregnant -breast-feeding How should I use this medicine? This drug is given as an infusion into a vein. It is administered in a hospital or clinic by a specially trained health care professional. If you have pain, swelling, burning or any unusual feeling around the site of your injection, tell your health care professional right away. Talk to your pediatrician regarding the use of this medicine in children. Special care may be needed. Overdosage: If you think you have taken too much of this medicine contact a poison control center or emergency room at once. NOTE: This medicine is only for you. Do not share this medicine with others. What if I miss a dose? It is important not to miss your dose. Call your doctor or health care professional if you are unable to keep an appointment. What may interact with this medicine? Do not take this medicine with any of the following medications: -cisapride -droperidol -halofantrine -pimozide -zidovudine This medicine may also interact with the following medications: -chloroquine -chlorpromazine -clarithromycin -cyclophosphamide -cyclosporine -erythromycin -medicines for depression, anxiety, or psychotic disturbances -medicines for irregular heart beat like amiodarone, bepridil, dofetilide, encainide, flecainide, propafenone, quinidine -medicines for seizures like ethotoin, fosphenytoin, phenytoin -medicines for nausea, vomiting like dolasetron, ondansetron, palonosetron -medicines to increase blood counts like filgrastim, pegfilgrastim, sargramostim -methadone -methotrexate -pentamidine -progesterone -vaccines -verapamil Talk to your doctor or health care professional before taking any of these medicines: -acetaminophen -aspirin -ibuprofen -ketoprofen -naproxen This list may not describe all possible interactions. Give your  health care provider a list of all the medicines, herbs, non-prescription drugs, or dietary supplements you  tell them if you smoke, drink alcohol, or use illegal drugs. Some items may interact with your medicine. What should I watch for while using this medicine? Your condition will be monitored carefully while you are receiving this medicine. You will need important blood work done while you are taking this medicine. This drug may make you feel generally unwell. This is not uncommon, as chemotherapy can affect healthy cells as well as cancer cells. Report any side effects. Continue your course of treatment even though you feel ill unless your doctor tells you to stop. Your urine may turn red for a few days after your dose. This is not blood. If your urine is dark or brown, call your doctor. In some cases, you may be given additional medicines to help with side effects. Follow all directions for their use. Call your doctor or health care professional for advice if you get a fever, chills or sore throat, or other symptoms of a cold or flu. Do not treat yourself. This drug decreases your body's ability to fight infections. Try to avoid being around people who are sick. This medicine may increase your risk to bruise or bleed. Call your doctor or health care professional if you notice any unusual bleeding. Be careful brushing and flossing your teeth or using a toothpick because you may get an infection or bleed more easily. If you have any dental work done, tell your dentist you are receiving this medicine. Avoid taking products that contain aspirin, acetaminophen, ibuprofen, naproxen, or ketoprofen unless instructed by your doctor. These medicines may hide a fever. Men and women of childbearing age should use effective birth control methods while using taking this medicine. Do not become pregnant while taking this medicine. There is a potential for serious side effects to an unborn child. Talk to  your health care professional or pharmacist for more information. Do not breast-feed an infant while taking this medicine. Do not let others touch your urine or other body fluids for 5 days after each treatment with this medicine. Caregivers should wear latex gloves to avoid touching body fluids during this time. There is a maximum amount of this medicine you should receive throughout your life. The amount depends on the medical condition being treated and your overall health. Your doctor will watch how much of this medicine you receive in your lifetime. Tell your doctor if you have taken this medicine before. What side effects may I notice from receiving this medicine? Side effects that you should report to your doctor or health care professional as soon as possible: -allergic reactions like skin rash, itching or hives, swelling of the face, lips, or tongue -low blood counts - this medicine may decrease the number of white blood cells, red blood cells and platelets. You may be at increased risk for infections and bleeding. -signs of infection - fever or chills, cough, sore throat, pain or difficulty passing urine -signs of decreased platelets or bleeding - bruising, pinpoint red spots on the skin, black, tarry stools, blood in the urine -signs of decreased red blood cells - unusually weak or tired, fainting spells, lightheadedness -breathing problems -chest pain -fast, irregular heartbeat -mouth sores -nausea, vomiting -pain, swelling, redness at site where injected -pain, tingling, numbness in the hands or feet -swelling of ankles, feet, or hands -unusual bleeding or bruising Side effects that usually do not require medical attention (report to your doctor or health care professional if they continue or are bothersome): -diarrhea -facial flushing -hair loss -loss of   loss of appetite -missed menstrual periods -nail discoloration or damage -red or watery eyes -red colored urine -stomach  upset This list may not describe all possible side effects. Call your doctor for medical advice about side effects. You may report side effects to FDA at 1-800-FDA-1088. Where should I keep my medicine? This drug is given in a hospital or clinic and will not be stored at home. NOTE: This sheet is a summary. It may not cover all possible information. If you have questions about this medicine, talk to your doctor, pharmacist, or health care provider.    2016, [employer]. (2012-09-18 09:54:34)  Cyclophosphamide injection What is this medicine? CYCLOPHOSPHAMIDE (sye kloe FOSS fa mide) is a chemotherapy drug. It slows the growth of cancer cells. This medicine is used to treat many types of cancer like lymphoma, myeloma, leukemia, breast cancer, and ovarian cancer, to name a few. This medicine may be used for other purposes; ask your health care provider or pharmacist if you have questions. What should I tell my health care provider before I take this medicine? They need to know if you have any of these conditions: -blood disorders -history of other chemotherapy -infection -kidney disease -liver disease -recent or ongoing radiation therapy -tumors in the bone marrow -an unusual or allergic reaction to cyclophosphamide, other chemotherapy, other medicines, foods, dyes, or preservatives -pregnant or trying to get pregnant -breast-feeding How should I use this medicine? This drug is usually given as an injection into a vein or muscle or by infusion into a vein. It is administered in a hospital or clinic by a specially trained health care professional. Talk to your pediatrician regarding the use of this medicine in children. Special care may be needed. Overdosage: If you think you have taken too much of this medicine contact a poison control center or emergency room at once. NOTE: This medicine is only for you. Do not share this medicine with others. What if I miss a dose? It is  important not to miss your dose. Call your doctor or health care professional if you are unable to keep an appointment. What may interact with this medicine? This medicine may interact with the following medications: -amiodarone -amphotericin B -azathioprine -certain antiviral medicines for HIV or AIDS such as protease inhibitors (e.g., indinavir, ritonavir) and zidovudine -certain blood pressure medications such as benazepril, captopril, enalapril, fosinopril, lisinopril, moexipril, monopril, perindopril, quinapril, ramipril, trandolapril -certain cancer medications such as anthracyclines (e.g., daunorubicin, doxorubicin), busulfan, cytarabine, paclitaxel, pentostatin, tamoxifen, trastuzumab -certain diuretics such as chlorothiazide, chlorthalidone, hydrochlorothiazide, indapamide, metolazone -certain medicines that treat or prevent blood clots like warfarin -certain muscle relaxants such as succinylcholine -cyclosporine -etanercept -indomethacin -medicines to increase blood counts like filgrastim, pegfilgrastim, sargramostim -medicines used as general anesthesia -metronidazole -natalizumab This list may not describe all possible interactions. Give your health care provider a list of all the medicines, herbs, non-prescription drugs, or dietary supplements you use. Also tell them if you smoke, drink alcohol, or use illegal drugs. Some items may interact with your medicine. What should I watch for while using this medicine? Visit your doctor for checks on your progress. This drug may make you feel generally unwell. This is not uncommon, as chemotherapy can affect healthy cells as well as cancer cells. Report any side effects. Continue your course of treatment even though you feel ill unless your doctor tells you to stop. Drink water or other fluids as directed. Urinate often, even at night. In some cases, you may be given additional medicines  to help with side effects. Follow all directions for  their use. Call your doctor or health care professional for advice if you get a fever, chills or sore throat, or other symptoms of a cold or flu. Do not treat yourself. This drug decreases your body's ability to fight infections. Try to avoid being around people who are sick. This medicine may increase your risk to bruise or bleed. Call your doctor or health care professional if you notice any unusual bleeding. Be careful brushing and flossing your teeth or using a toothpick because you may get an infection or bleed more easily. If you have any dental work done, tell your dentist you are receiving this medicine. You may get drowsy or dizzy. Do not drive, use machinery, or do anything that needs mental alertness until you know how this medicine affects you. Do not become pregnant while taking this medicine or for 1 year after stopping it. Women should inform their doctor if they wish to become pregnant or think they might be pregnant. Men should not father a child while taking this medicine and for 4 months after stopping it. There is a potential for serious side effects to an unborn child. Talk to your health care professional or pharmacist for more information. Do not breast-feed an infant while taking this medicine. This medicine may interfere with the ability to have a child. This medicine has caused ovarian failure in some women. This medicine has caused reduced sperm counts in some men. You should talk with your doctor or health care professional if you are concerned about your fertility. If you are going to have surgery, tell your doctor or health care professional that you have taken this medicine. What side effects may I notice from receiving this medicine? Side effects that you should report to your doctor or health care professional as soon as possible: -allergic reactions like skin rash, itching or hives, swelling of the face, lips, or tongue -low blood counts - this medicine may decrease the  number of white blood cells, red blood cells and platelets. You may be at increased risk for infections and bleeding. -signs of infection - fever or chills, cough, sore throat, pain or difficulty passing urine -signs of decreased platelets or bleeding - bruising, pinpoint red spots on the skin, black, tarry stools, blood in the urine -signs of decreased red blood cells - unusually weak or tired, fainting spells, lightheadedness -breathing problems -dark urine -dizziness -palpitations -swelling of the ankles, feet, hands -trouble passing urine or change in the amount of urine -weight gain -yellowing of the eyes or skin Side effects that usually do not require medical attention (report to your doctor or health care professional if they continue or are bothersome): -changes in nail or skin color -hair loss -missed menstrual periods -mouth sores -nausea, vomiting This list may not describe all possible side effects. Call your doctor for medical advice about side effects. You may report side effects to FDA at 1-800-FDA-1088. Where should I keep my medicine? This drug is given in a hospital or clinic and will not be stored at home. NOTE: This sheet is a summary. It may not cover all possible information. If you have questions about this medicine, talk to your doctor, pharmacist, or health care provider.    2016, [employer]. (2012-04-06 16:22:58)   Pegfilgrastim injection What is this medicine? PEGFILGRASTIM (PEG fil gra stim) is a long-acting granulocyte colony-stimulating factor that stimulates the growth of neutrophils, a type of white blood cell  important in the body's fight against infection. It is used to reduce the incidence of fever and infection in patients with certain types of cancer who are receiving chemotherapy that affects the bone marrow, and to increase survival after being exposed to high doses of radiation. This medicine may be used for other purposes; ask your  health care provider or pharmacist if you have questions. What should I tell my health care provider before I take this medicine? They need to know if you have any of these conditions: -kidney disease -latex allergy -ongoing radiation therapy -sickle cell disease -skin reactions to acrylic adhesives (On-Body Injector only) -an unusual or allergic reaction to pegfilgrastim, filgrastim, other medicines, foods, dyes, or preservatives -pregnant or trying to get pregnant -breast-feeding How should I use this medicine? This medicine is for injection under the skin. If you get this medicine at home, you will be taught how to prepare and give the pre-filled syringe or how to use the On-body Injector. Refer to the patient Instructions for Use for detailed instructions. Use exactly as directed. Take your medicine at regular intervals. Do not take your medicine more often than directed. It is important that you put your used needles and syringes in a special sharps container. Do not put them in a trash can. If you do not have a sharps container, call your pharmacist or healthcare provider to get one. Talk to your pediatrician regarding the use of this medicine in children. While this drug may be prescribed for selected conditions, precautions do apply. Overdosage: If you think you have taken too much of this medicine contact a poison control center or emergency room at once. NOTE: This medicine is only for you. Do not share this medicine with others. What if I miss a dose? It is important not to miss your dose. Call your doctor or health care professional if you miss your dose. If you miss a dose due to an On-body Injector failure or leakage, a new dose should be administered as soon as possible using a single prefilled syringe for manual use. What may interact with this medicine? Interactions have not been studied. Give your health care provider a list of all the medicines, herbs, non-prescription drugs,  or dietary supplements you use. Also tell them if you smoke, drink alcohol, or use illegal drugs. Some items may interact with your medicine. This list may not describe all possible interactions. Give your health care provider a list of all the medicines, herbs, non-prescription drugs, or dietary supplements you use. Also tell them if you smoke, drink alcohol, or use illegal drugs. Some items may interact with your medicine. What should I watch for while using this medicine? You may need blood work done while you are taking this medicine. If you are going to need a MRI, CT scan, or other procedure, tell your doctor that you are using this medicine (On-Body Injector only). What side effects may I notice from receiving this medicine? Side effects that you should report to your doctor or health care professional as soon as possible: -allergic reactions like skin rash, itching or hives, swelling of the face, lips, or tongue -dizziness -fever -pain, redness, or irritation at site where injected -pinpoint red spots on the skin -red or dark-brown urine -shortness of breath or breathing problems -stomach or side pain, or pain at the shoulder -swelling -tiredness -trouble passing urine or change in the amount of urine Side effects that usually do not require medical attention (report to your doctor  or health care professional if they continue or are bothersome): -bone pain -muscle pain This list may not describe all possible side effects. Call your doctor for medical advice about side effects. You may report side effects to FDA at 1-800-FDA-1088. Where should I keep my medicine? Keep out of the reach of children. Store pre-filled syringes in a refrigerator between 2 and 8 degrees C (36 and 46 degrees F). Do not freeze. Keep in carton to protect from light. Throw away this medicine if it is left out of the refrigerator for more than 48 hours. Throw away any unused medicine after the expiration  date. NOTE: This sheet is a summary. It may not cover all possible information. If you have questions about this medicine, talk to your doctor, pharmacist, or health care provider.    2016, [employer] Standard. (2014-06-12 14:30:14)

## 2016-03-28 NOTE — Progress Notes (Signed)
Patient Care Team: Estill Bamberg. [name], MD as PCP - General (Internal Medicine) Alphonsa Overall, MD as Consulting Physician (General Surgery) Nicholas Lose, MD as Consulting Physician (Hematology and Oncology) Kyung Rudd, MD as Consulting Physician (Radiation Oncology)  DIAGNOSIS:  Encounter Diagnosis  Name Primary?  . Malignant neoplasm of upper-inner quadrant of right breast in female, estrogen receptor negative ([place])     SUMMARY OF ONCOLOGIC HISTORY:   Breast cancer of upper-inner quadrant of right female breast ([place])   02/29/2016 Initial Diagnosis    Right breast palpable mass (with silicone implants 6387), 3.5 cm on MRI, additional 3 cm anterior linear enhancement? 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 Ki-67 60%, T2 N0 stage 2A clinical stage      03/14/2016 Procedure    Right breast biopsy upper inner quadrant: IDC grade 3      03/28/2016 -  Neo-Adjuvant Chemotherapy    Neoadjuvant chemotherapy with dose dense Adriamycin and Cytoxan followed by Abraxane weekly 12 ( patient is diabetic and cannot take steroids)       CHIEF COMPLIANT: cycle 1 day 1 dose dense Adriamycin and Cytoxan  INTERVAL HISTORY: Marissa Hall is a 75 year old with above-mentioned history of right breast cancer currently starting neoadjuvant chemotherapy. She denies any new complaints or concerns. She could not sleep last night. She is very anxious to get started with the treatment.she had a heavy breakfast with lots of high carbohydrates. Her sugar today is significantly elevated.  REVIEW OF SYSTEMS:   Constitutional: Denies fevers, chills or abnormal weight loss Eyes: Denies blurriness of vision Ears, nose, mouth, throat, and face: Denies mucositis or sore throat Respiratory: Denies cough, dyspnea or wheezes Cardiovascular: Denies palpitation, chest discomfort Gastrointestinal:  Denies nausea, heartburn or change in bowel habits Skin: Denies abnormal skin rashes Lymphatics: Denies new  lymphadenopathy or easy bruising Neurological:Denies numbness, tingling or new weaknesses Behavioral/Psych: Mood is stable, no new changes  Extremities: No lower extremity edema  All other systems were reviewed with the patient and are negative.  I have reviewed the past medical history, past surgical history, social history and family history with the patient and they are unchanged from previous note.  ALLERGIES:  is allergic to other and penicillins.  MEDICATIONS:  Current Outpatient Prescriptions  Medication Sig Dispense Refill  . atorvastatin (LIPITOR) 80 MG tablet Take 80 mg by mouth every evening.     [name] Kitchen azelastine (ASTELIN) 0.1 % nasal spray Place 2 sprays into both nostrils daily as needed for rhinitis or allergies. Use in each nostril as directed    . gabapentin (NEURONTIN) 100 MG capsule Take 200 mg by mouth at bedtime.    [name] Kitchen HYDROcodone-acetaminophen (NORCO/VICODIN) 5-325 MG tablet Take 1-2 tablets by mouth every 6 (six) hours as needed. (Patient not taking: Reported on 03/25/2016) 20 tablet 0  . levothyroxine (SYNTHROID, LEVOTHROID) 100 MCG tablet Take 100 mcg by mouth daily before breakfast.     . lidocaine-prilocaine (EMLA) cream Apply to affected area once (Patient not taking: Reported on 03/25/2016) 30 g 3  . lisinopril (PRINIVIL,ZESTRIL) 20 MG tablet Take 20 mg by mouth every evening.     [name] Kitchen LORazepam (ATIVAN) 0.5 MG tablet Take 1 tablet (0.5 mg total) by mouth every 6 (six) hours as needed (Nausea or vomiting). 30 tablet 0  . LORazepam (ATIVAN) 1 MG tablet Take 0.5 mg by mouth 2 (two) times daily.     . metFORMIN (GLUCOPHAGE-XR) 500 MG 24 hr tablet Take 1,000 mg by mouth daily with supper.     [name] Kitchen  ondansetron (ZOFRAN) 8 MG tablet Take 1 tablet (8 mg total) by mouth 2 (two) times daily as needed. Start on the third day after chemotherapy. (Patient not taking: Reported on 03/25/2016) 30 tablet 1  . prochlorperazine (COMPAZINE) 10 MG tablet Take 1 tablet (10 mg total) by mouth  every 6 (six) hours as needed (Nausea or vomiting). (Patient not taking: Reported on 03/25/2016) 30 tablet 1  . Vitamin D, Ergocalciferol, (DRISDOL) 50000 units CAPS capsule Take 50,000 Units by mouth every 7 (seven) days. Saturdays     No current facility-administered medications for this visit.     PHYSICAL EXAMINATION: ECOG PERFORMANCE STATUS: 1 - Symptomatic but completely ambulatory  Vitals:   03/28/16 1003  BP: (!) 149/48  Pulse: 66  Resp: 18  Temp: 98.4 F (36.9 C)   Filed Weights   03/28/16 1003  Weight: 179 lb (81.2 kg)    GENERAL:alert, no distress and comfortable SKIN: skin color, texture, turgor are normal, no rashes or significant lesions EYES: normal, Conjunctiva are pink and non-injected, sclera clear OROPHARYNX:no exudate, no erythema and lips, buccal mucosa, and tongue normal  NECK: supple, thyroid normal size, non-tender, without nodularity LYMPH:  no palpable lymphadenopathy in the cervical, axillary or inguinal LUNGS: clear to auscultation and percussion with normal breathing effort HEART: regular rate & rhythm and no murmurs and no lower extremity edema ABDOMEN:abdomen soft, non-tender and normal bowel sounds MUSCULOSKELETAL:no cyanosis of digits and no clubbing  NEURO: alert & oriented x 3 with fluent speech, no focal motor/sensory deficits EXTREMITIES: No lower extremity edema  LABORATORY DATA:  I have reviewed the data as listed   Chemistry      Component Value Date/Time   NA 138 03/28/2016 0917   K 4.8 03/28/2016 0917   CL 103 03/16/2016 0506   CO2 23 03/28/2016 0917   BUN 18.0 03/28/2016 0917   CREATININE 0.9 03/28/2016 0917      Component Value Date/Time   CALCIUM 9.6 03/28/2016 0917   ALKPHOS 87 03/28/2016 0917   AST 53 (H) 03/28/2016 0917   ALT 60 (H) 03/28/2016 0917   BILITOT 0.83 03/28/2016 0917       Lab Results  Component Value Date   WBC 4.1 03/28/2016   HGB 13.8 03/28/2016   HCT 42.0 03/28/2016   MCV 97.2 03/28/2016    PLT 187 03/28/2016   NEUTROABS 2.1 03/28/2016     ASSESSMENT & PLAN:  Breast cancer of upper-inner quadrant of right female breast ([place]) 02/29/2016: Right breast palpable mass (with silicone implants 5631), 3.5 cm on MRI, additional 3 cm anterior linear enhancement (biopsy 03/14/2016 IDC grade 3); 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   T2 N0 stage 2A clinical stage  Recommendation: 1. Neoadjuvant chemotherapy with dose dense Adriamycin and Cytoxan 4 followed by Abraxane weekly 12  (patient is diabetic and cannot take steroids) 2. followed by breast conserving surgery (with plastic surgery help regarding implant rupture) 3. Followed by radiation therapy ----------------------------------------------------------------------------------------------------------------------------------------- Current treatment: Cycle 1 day 1 dose dense Adriamycin and Cytoxan Antiemetics were reviewed Chemotherapy consent obtained Chemotherapy education completed Echocardiogram 03/25/2016: EF 60-65% Closely monitoring for chemotherapy toxicities. Return to clinic in one week for toxicity check  Diabetes mellitus: I instructed her about strict glycemic control.  Mildly elevated AST and ALT: Related to alcohol consumption yesterday. I instructed her that she cannot drink any alcohol.   No orders of the defined types were placed in this encounter.  The patient has a good understanding of the overall  plan. she agrees with it. she will call with any problems that may develop before the next visit here.   Rulon Eisenmenger, MD 03/28/16

## 2016-03-28 NOTE — Assessment & Plan Note (Signed)
02/29/2016: Right breast palpable mass (with silicone implants 5391), 3.5 cm on MRI, additional 3 cm anterior linear enhancement (biopsy 03/14/2016 IDC grade 3); 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   T2 N0 stage 2A clinical stage  Recommendation: 1. Neoadjuvant chemotherapy with dose dense Adriamycin and Cytoxan 4 followed by Abraxane weekly 12  (patient is diabetic and cannot take steroids) 2. followed by breast conserving surgery (with plastic surgery help regarding implant rupture) 3. Followed by radiation therapy ----------------------------------------------------------------------------------------------------------------------------------------- Current treatment: Cycle 1 day 1 dose dense Adriamycin and Cytoxan Antiemetics were reviewed Chemotherapy consent obtained Chemotherapy education completed Echocardiogram 03/25/2016: EF 60-65% Closely monitoring for chemotherapy toxicities. Return to clinic in one week for toxicity check

## 2016-04-01 ENCOUNTER — Telehealth: Payer: Self-pay | Admitting: *Deleted

## 2016-04-01 NOTE — Telephone Encounter (Signed)
Received call from patient stating she called triage this morning but no one has called her back.  She is feels like she has a lot of gas, indigestion and having trouble moving her bowels. She had a small bowel movement yesterday.  Instructed her to take Pepcid twice a day and Miralax plus stool softners.  Instructed her to call back if over the weekend she is not feeling better and getting relief.  She verbalized understanding.

## 2016-04-04 ENCOUNTER — Ambulatory Visit (HOSPITAL_BASED_OUTPATIENT_CLINIC_OR_DEPARTMENT_OTHER): Payer: Medicare Other | Admitting: Hematology and Oncology

## 2016-04-04 ENCOUNTER — Other Ambulatory Visit (HOSPITAL_BASED_OUTPATIENT_CLINIC_OR_DEPARTMENT_OTHER): Payer: Medicare Other

## 2016-04-04 ENCOUNTER — Encounter: Payer: Self-pay | Admitting: Hematology and Oncology

## 2016-04-04 DIAGNOSIS — Z171 Estrogen receptor negative status [ER-]: Secondary | ICD-10-CM

## 2016-04-04 DIAGNOSIS — C50211 Malignant neoplasm of upper-inner quadrant of right female breast: Secondary | ICD-10-CM

## 2016-04-04 DIAGNOSIS — E119 Type 2 diabetes mellitus without complications: Secondary | ICD-10-CM

## 2016-04-04 LAB — COMPREHENSIVE METABOLIC PANEL
ALBUMIN: 3.8 g/dL (ref 3.5–5.0)
ALT: 47 U/L (ref 0–55)
AST: 48 U/L — AB (ref 5–34)
Alkaline Phosphatase: 108 U/L (ref 40–150)
Anion Gap: 9 mEq/L (ref 3–11)
BUN: 15.9 mg/dL (ref 7.0–26.0)
CHLORIDE: 102 meq/L (ref 98–109)
CO2: 25 meq/L (ref 22–29)
Calcium: 9.3 mg/dL (ref 8.4–10.4)
Creatinine: 0.8 mg/dL (ref 0.6–1.1)
EGFR: 70 mL/min/{1.73_m2} — AB (ref >90)
GLUCOSE: 224 mg/dL — AB (ref 70–140)
POTASSIUM: 4.6 meq/L (ref 3.5–5.1)
SODIUM: 136 meq/L (ref 136–145)
Total Bilirubin: 1.27 mg/dL — ABNORMAL HIGH (ref 0.20–1.20)
Total Protein: 7.2 g/dL (ref 6.4–8.3)

## 2016-04-04 LAB — CBC WITH DIFFERENTIAL/PLATELET
BASO%: 2.7 % — AB (ref 0.0–2.0)
Basophils Absolute: 0 10*3/uL (ref 0.0–0.1)
EOS ABS: 0.1 10*3/uL (ref 0.0–0.5)
EOS%: 8.1 % — ABNORMAL HIGH (ref 0.0–7.0)
HCT: 39.4 % (ref 34.8–46.6)
HEMOGLOBIN: 13.3 g/dL (ref 11.6–15.9)
LYMPH%: 82.4 % — AB (ref 14.0–49.7)
MCH: 32.4 pg (ref 25.1–34.0)
MCHC: 33.8 g/dL (ref 31.5–36.0)
MCV: 95.9 fL (ref 79.5–101.0)
MONO#: 0.1 10*3/uL (ref 0.1–0.9)
MONO%: 6.8 % (ref 0.0–14.0)
NEUT%: 0 % — ABNORMAL LOW (ref 38.4–76.8)
NEUTROS ABS: 0 10*3/uL — AB (ref 1.5–6.5)
PLATELETS: 99 10*3/uL — AB (ref 145–400)
RBC: 4.11 10*6/uL (ref 3.70–5.45)
RDW: 12.8 % (ref 11.2–14.5)
WBC: 0.7 10*3/uL — AB (ref 3.9–10.3)
lymph#: 0.6 10*3/uL — ABNORMAL LOW (ref 0.9–3.3)

## 2016-04-04 LAB — TECHNOLOGIST REVIEW

## 2016-04-04 NOTE — Progress Notes (Signed)
Received critical WBC 0.7 and ANC 0.0 from lab.  Reviewed with Dr. Lindi Adie.

## 2016-04-04 NOTE — Progress Notes (Signed)
Patient Care Team: Estill Bamberg. [name], MD as PCP - General (Internal Medicine) Alphonsa Overall, MD as Consulting Physician (General Surgery) Nicholas Lose, MD as Consulting Physician (Hematology and Oncology) Kyung Rudd, MD as Consulting Physician (Radiation Oncology)  DIAGNOSIS:  Encounter Diagnosis  Name Primary?  . Malignant neoplasm of upper-inner quadrant of right breast in female, estrogen receptor negative ([place])     SUMMARY OF ONCOLOGIC HISTORY:   Breast cancer of upper-inner quadrant of right female breast ([place])   02/29/2016 Initial Diagnosis    Right breast palpable mass (with silicone implants 7341), 3.5 cm on MRI, additional 3 cm anterior linear enhancement? 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 Ki-67 60%, T2 N0 stage 2A clinical stage      03/14/2016 Procedure    Right breast biopsy upper inner quadrant: IDC grade 3      03/28/2016 -  Neo-Adjuvant Chemotherapy    Neoadjuvant chemotherapy with dose dense Adriamycin and Cytoxan followed by Abraxane weekly 12 ( patient is diabetic and cannot take steroids)       CHIEF COMPLIANT: Cycle 1 day 8 dose dense Adriamycin and Cytoxan neoadjuvant chemotherapy  INTERVAL HISTORY: Marissa Hall is a 75 year old with above-mentioned history of right breast cancer triple negative disease who is currently on neoadjuvant chemotherapy with dose dense Adriamycin and Cytoxan. She received cycle 1 of chemotherapy last week and is here today for toxicity check. She did well for 3 days after chemotherapy and unfortunately she started having intractable nausea. She did not have vomiting. She tried to take Zofran but it caused her constipation. And she took Compazine it made her extremely confused and she did not want to take that. The nausea subsided slightly and is improving slowly over time. She is also having gastritis and constipation issues. She started taking omeprazole and it appears to be helping the gastritis. She has taken a  stool softener and laxative for the constipation.  REVIEW OF SYSTEMS:   Constitutional: Denies fevers, chills or abnormal weight loss Eyes: Denies blurriness of vision Ears, nose, mouth, throat, and face: Denies mucositis or sore throat Respiratory: Denies cough, dyspnea or wheezes Cardiovascular: Denies palpitation, chest discomfort Gastrointestinal:  Nausea, constipation Skin: Denies abnormal skin rashes Lymphatics: Denies new lymphadenopathy or easy bruising Neurological:Denies numbness, tingling or new weaknesses Behavioral/Psych: Mood is stable, no new changes  Extremities: No lower extremity edema Breast:  denies any pain or lumps or nodules in either breasts All other systems were reviewed with the patient and are negative.  I have reviewed the past medical history, past surgical history, social history and family history with the patient and they are unchanged from previous note.  ALLERGIES:  is allergic to other and penicillins.  MEDICATIONS:  Current Outpatient Prescriptions  Medication Sig Dispense Refill  . atorvastatin (LIPITOR) 80 MG tablet Take 80 mg by mouth every evening.     [name] Kitchen azelastine (ASTELIN) 0.1 % nasal spray Place 2 sprays into both nostrils daily as needed for rhinitis or allergies. Use in each nostril as directed    . gabapentin (NEURONTIN) 100 MG capsule Take 200 mg by mouth at bedtime.    [name] Kitchen HYDROcodone-acetaminophen (NORCO/VICODIN) 5-325 MG tablet Take 1-2 tablets by mouth every 6 (six) hours as needed. (Patient not taking: Reported on 03/25/2016) 20 tablet 0  . levothyroxine (SYNTHROID, LEVOTHROID) 100 MCG tablet Take 100 mcg by mouth daily before breakfast.     . lidocaine-prilocaine (EMLA) cream Apply to affected area once (Patient not taking: Reported on 03/25/2016)  30 g 3  . lisinopril (PRINIVIL,ZESTRIL) 20 MG tablet Take 20 mg by mouth every evening.     [name] Kitchen LORazepam (ATIVAN) 0.5 MG tablet Take 1 tablet (0.5 mg total) by mouth every 6 (six) hours as  needed (Nausea or vomiting). 30 tablet 0  . LORazepam (ATIVAN) 1 MG tablet Take 0.5 mg by mouth 2 (two) times daily.     . metFORMIN (GLUCOPHAGE-XR) 500 MG 24 hr tablet Take 1,000 mg by mouth daily with supper.     . ondansetron (ZOFRAN) 8 MG tablet Take 1 tablet (8 mg total) by mouth 2 (two) times daily as needed. Start on the third day after chemotherapy. (Patient not taking: Reported on 03/25/2016) 30 tablet 1  . prochlorperazine (COMPAZINE) 10 MG tablet Take 1 tablet (10 mg total) by mouth every 6 (six) hours as needed (Nausea or vomiting). (Patient not taking: Reported on 03/25/2016) 30 tablet 1  . Vitamin D, Ergocalciferol, (DRISDOL) 50000 units CAPS capsule Take 50,000 Units by mouth every 7 (seven) days. Saturdays     No current facility-administered medications for this visit.   There is no acute discontinuation Short  PHYSICAL EXAMINATION: ECOG PERFORMANCE STATUS: 1 - Symptomatic but completely ambulatory  There were no vitals filed for this visit. There were no vitals filed for this visit.  GENERAL:alert, no distress and comfortable SKIN: skin color, texture, turgor are normal, no rashes or significant lesions EYES: normal, Conjunctiva are pink and non-injected, sclera clear OROPHARYNX:no exudate, no erythema and lips, buccal mucosa, and tongue normal  NECK: supple, thyroid normal size, non-tender, without nodularity LYMPH:  no palpable lymphadenopathy in the cervical, axillary or inguinal LUNGS: clear to auscultation and percussion with normal breathing effort HEART: regular rate & rhythm and no murmurs and no lower extremity edema ABDOMEN:abdomen soft, non-tender and normal bowel sounds MUSCULOSKELETAL:no cyanosis of digits and no clubbing  NEURO: alert & oriented x 3 with fluent speech, no focal motor/sensory deficits EXTREMITIES: No lower extremity edema  LABORATORY DATA:  I have reviewed the data as listed   Chemistry      Component Value Date/Time   NA 136  04/04/2016 1022   K 4.6 04/04/2016 1022   CL 103 03/16/2016 0506   CO2 25 04/04/2016 1022   BUN 15.9 04/04/2016 1022   CREATININE 0.8 04/04/2016 1022      Component Value Date/Time   CALCIUM 9.3 04/04/2016 1022   ALKPHOS 108 04/04/2016 1022   AST 48 (H) 04/04/2016 1022   ALT 47 04/04/2016 1022   BILITOT 1.27 (H) 04/04/2016 1022       Lab Results  Component Value Date   WBC 0.7 (LL) 04/04/2016   HGB 13.3 04/04/2016   HCT 39.4 04/04/2016   MCV 95.9 04/04/2016   PLT 99 (L) 04/04/2016   NEUTROABS 0.0 (LL) 04/04/2016   ASSESSMENT & PLAN:  Breast cancer of upper-inner quadrant of right female breast ([place]) 02/29/2016: Right breast palpable mass (with silicone implants 1443), 3.5 cm on MRI, additional 3 cm anterior linear enhancement (biopsy 03/14/2016 IDC grade 3); 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  T2 N0 stage 2A clinical stage  Recommendation: 1. Neoadjuvant chemotherapy with dose dense Adriamycin and Cytoxan 4 followed by Abraxane weekly 12 (patient is diabetic and cannot takesteroids) 2. followed by breast conserving surgery (with plastic surgery help regarding implant rupture) 3. Followed by radiation therapy ----------------------------------------------------------------------------------------------------------------------------------------- Current treatment: Cycle 1 day 8 dose dense Adriamycin and Cytoxan Echocardiogram 03/25/2016: EF 60-65% Labs reviewed Chemotherapy toxicities: 1. Grade  3 neutropenia: ANC 0 with cycle 1 day 8 : I decreased the dosage of cycle 2 of chemotherapy  2. grade 2 nausea: Instructed her to take stool softeners and proton pump inhibitors along with antinausea medications. Instructed her to take half a tablet of Compazine because it was making her drowsy.  3. Fatigue: Due to chemotherapy   Closely monitoring for chemotherapy toxicities. Return to clinic in one week fcycle 2   Diabetes mellitus: I instructed her about strict  glycemic control.  Mildly elevated AST and ALT: Related to alcohol consumption. I instructed her that she cannot drink any alcohol. The  Numbers are improving   I will see the patient back with cycle 3.   No orders of the defined types were placed in this encounter.  The patient has a good understanding of the overall plan. she agrees with it. she will call with any problems that may develop before the next visit here.   Rulon Eisenmenger, MD 04/04/16

## 2016-04-04 NOTE — Assessment & Plan Note (Signed)
02/29/2016: Right breast palpable mass (with silicone implants 9810), 3.5 cm on MRI, additional 3 cm anterior linear enhancement (biopsy 03/14/2016 IDC grade 3); 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  T2 N0 stage 2A clinical stage  Recommendation: 1. Neoadjuvant chemotherapy with dose dense Adriamycin and Cytoxan 4 followed by Abraxane weekly 12 (patient is diabetic and cannot takesteroids) 2. followed by breast conserving surgery (with plastic surgery help regarding implant rupture) 3. Followed by radiation therapy ----------------------------------------------------------------------------------------------------------------------------------------- Current treatment: Cycle 1 day 8 dose dense Adriamycin and Cytoxan Echocardiogram 03/25/2016: EF 60-65% Labs reviewed Chemotherapy toxicities:  Closely monitoring for chemotherapy toxicities. Return to clinic in one week for toxicity check  Diabetes mellitus: I instructed her about strict glycemic control.  Mildly elevated AST and ALT: Related to alcohol consumption. I instructed her that she cannot drink any alcohol.

## 2016-04-06 ENCOUNTER — Other Ambulatory Visit: Payer: Self-pay | Admitting: *Deleted

## 2016-04-06 DIAGNOSIS — C50211 Malignant neoplasm of upper-inner quadrant of right female breast: Secondary | ICD-10-CM

## 2016-04-06 DIAGNOSIS — Z171 Estrogen receptor negative status [ER-]: Principal | ICD-10-CM

## 2016-04-06 MED ORDER — MAGIC MOUTHWASH W/LIDOCAINE: ORAL | 1 refills | Status: DC

## 2016-04-06 NOTE — Telephone Encounter (Signed)
Patient called in and stated Dr. Lindi Adie suggested Magic Mouthwash. This RN called into her pharmacy.

## 2016-04-11 ENCOUNTER — Encounter: Payer: Self-pay | Admitting: *Deleted

## 2016-04-11 ENCOUNTER — Ambulatory Visit (HOSPITAL_BASED_OUTPATIENT_CLINIC_OR_DEPARTMENT_OTHER): Payer: Medicare Other

## 2016-04-11 ENCOUNTER — Other Ambulatory Visit (HOSPITAL_BASED_OUTPATIENT_CLINIC_OR_DEPARTMENT_OTHER): Payer: Medicare Other

## 2016-04-11 VITALS — BP 122/61 | HR 55 | Temp 97.9°F | Resp 18

## 2016-04-11 DIAGNOSIS — Z23 Encounter for immunization: Secondary | ICD-10-CM

## 2016-04-11 DIAGNOSIS — Z171 Estrogen receptor negative status [ER-]: Principal | ICD-10-CM

## 2016-04-11 DIAGNOSIS — Z5111 Encounter for antineoplastic chemotherapy: Secondary | ICD-10-CM

## 2016-04-11 DIAGNOSIS — C50211 Malignant neoplasm of upper-inner quadrant of right female breast: Secondary | ICD-10-CM

## 2016-04-11 LAB — CBC WITH DIFFERENTIAL/PLATELET
BASO%: 0.6 % (ref 0.0–2.0)
Basophils Absolute: 0 10*3/uL (ref 0.0–0.1)
EOS%: 0.3 % (ref 0.0–7.0)
Eosinophils Absolute: 0 10*3/uL (ref 0.0–0.5)
HEMATOCRIT: 34.9 % (ref 34.8–46.6)
HEMOGLOBIN: 11.9 g/dL (ref 11.6–15.9)
LYMPH#: 1.2 10*3/uL (ref 0.9–3.3)
LYMPH%: 17.1 % (ref 14.0–49.7)
MCH: 32.4 pg (ref 25.1–34.0)
MCHC: 34.1 g/dL (ref 31.5–36.0)
MCV: 95.1 fL (ref 79.5–101.0)
MONO#: 0.6 10*3/uL (ref 0.1–0.9)
MONO%: 9.2 % (ref 0.0–14.0)
NEUT#: 5.1 10*3/uL (ref 1.5–6.5)
NEUT%: 72.8 % (ref 38.4–76.8)
Platelets: 142 10*3/uL — ABNORMAL LOW (ref 145–400)
RBC: 3.67 10*6/uL — ABNORMAL LOW (ref 3.70–5.45)
RDW: 13.4 % (ref 11.2–14.5)
WBC: 7 10*3/uL (ref 3.9–10.3)

## 2016-04-11 LAB — COMPREHENSIVE METABOLIC PANEL
ALBUMIN: 3.6 g/dL (ref 3.5–5.0)
ALK PHOS: 110 U/L (ref 40–150)
ALT: 41 U/L (ref 0–55)
AST: 47 U/L — AB (ref 5–34)
Anion Gap: 10 mEq/L (ref 3–11)
BUN: 12.8 mg/dL (ref 7.0–26.0)
CALCIUM: 9.2 mg/dL (ref 8.4–10.4)
CHLORIDE: 106 meq/L (ref 98–109)
CO2: 24 mEq/L (ref 22–29)
CREATININE: 0.8 mg/dL (ref 0.6–1.1)
EGFR: 75 mL/min/{1.73_m2} — ABNORMAL LOW (ref >90)
GLUCOSE: 181 mg/dL — AB (ref 70–140)
Potassium: 3.8 mEq/L (ref 3.5–5.1)
Sodium: 141 mEq/L (ref 136–145)
Total Bilirubin: 0.4 mg/dL (ref 0.20–1.20)
Total Protein: 6.7 g/dL (ref 6.4–8.3)

## 2016-04-11 MED ORDER — DOXORUBICIN HCL CHEMO IV INJECTION 2 MG/ML
40.0000 mg/m2 | Freq: Once | INTRAVENOUS | Status: AC
  Administered 2016-04-11: 80 mg via INTRAVENOUS
  Filled 2016-04-11: qty 40

## 2016-04-11 MED ORDER — PALONOSETRON HCL INJECTION 0.25 MG/5ML
0.2500 mg | Freq: Once | INTRAVENOUS | Status: AC
  Administered 2016-04-11: 0.25 mg via INTRAVENOUS

## 2016-04-11 MED ORDER — PEGFILGRASTIM 6 MG/0.6ML ~~LOC~~ PSKT
6.0000 mg | PREFILLED_SYRINGE | Freq: Once | SUBCUTANEOUS | Status: AC
  Administered 2016-04-11: 6 mg via SUBCUTANEOUS
  Filled 2016-04-11: qty 0.6

## 2016-04-11 MED ORDER — SODIUM CHLORIDE 0.9% FLUSH
10.0000 mL | INTRAVENOUS | Status: DC | PRN
  Administered 2016-04-11: 10 mL
  Filled 2016-04-11: qty 10

## 2016-04-11 MED ORDER — SODIUM CHLORIDE 0.9 % IV SOLN
Freq: Once | INTRAVENOUS | Status: AC
  Administered 2016-04-11: 12:00:00 via INTRAVENOUS
  Filled 2016-04-11: qty 5

## 2016-04-11 MED ORDER — SODIUM CHLORIDE 0.9 % IV SOLN
Freq: Once | INTRAVENOUS | Status: AC
  Administered 2016-04-11: 12:00:00 via INTRAVENOUS

## 2016-04-11 MED ORDER — SODIUM CHLORIDE 0.9 % IV SOLN
400.0000 mg/m2 | Freq: Once | INTRAVENOUS | Status: AC
  Administered 2016-04-11: 800 mg via INTRAVENOUS
  Filled 2016-04-11: qty 40

## 2016-04-11 MED ORDER — HEPARIN SOD (PORK) LOCK FLUSH 100 UNIT/ML IV SOLN
500.0000 [IU] | Freq: Once | INTRAVENOUS | Status: AC | PRN
  Administered 2016-04-11: 500 [IU]
  Filled 2016-04-11: qty 5

## 2016-04-11 MED ORDER — INFLUENZA VAC SPLIT QUAD 0.5 ML IM SUSY
0.5000 mL | PREFILLED_SYRINGE | Freq: Once | INTRAMUSCULAR | Status: AC
  Administered 2016-04-11: 0.5 mL via INTRAMUSCULAR
  Filled 2016-04-11: qty 0.5

## 2016-04-11 MED ORDER — PALONOSETRON HCL INJECTION 0.25 MG/5ML
INTRAVENOUS | Status: AC
  Filled 2016-04-11: qty 5

## 2016-04-11 NOTE — Progress Notes (Signed)
Spoke with patient today in chemo and she is asking about a dentist appointment that she has on Thursday.  She states she thought Dr. Lindi Adie told her no, but the dentist ( Dr. Lenore Cordia) stated that they received a letter stating that it was ok.  She states her mouth feels much better and the mouthwash helped and she is able to wear her dentures.  I called and spoke to Dr. Dan Humphreys office and they state she is down for a routine cleaning but also a periodontal cleaning.  Informed them to cancel the appointment because the patient cannot have the periodontal cleaning due to receiving chemo and the patient states she is not having any problems since using the mouthwash.  Informed patient.  Encouraged her to call with any problems or concerns.

## 2016-04-11 NOTE — Patient Instructions (Signed)
[facility] Discharge Instructions for Patients Receiving Chemotherapy  Today you received the following chemotherapy agents Adriamycin and Cytoxan   To help prevent nausea and vomiting after your treatment, we encourage you to take your nausea medication as directed. No Zofran for 3 days. Take Compazine instead.    If you develop nausea and vomiting that is not controlled by your nausea medication, call the clinic.   BELOW ARE SYMPTOMS THAT SHOULD BE REPORTED IMMEDIATELY:  *FEVER GREATER THAN 100.5 F  *CHILLS WITH OR WITHOUT FEVER  NAUSEA AND VOMITING THAT IS NOT CONTROLLED WITH YOUR NAUSEA MEDICATION  *UNUSUAL SHORTNESS OF BREATH  *UNUSUAL BRUISING OR BLEEDING  TENDERNESS IN MOUTH AND THROAT WITH OR WITHOUT PRESENCE OF ULCERS  *URINARY PROBLEMS  *BOWEL PROBLEMS  UNUSUAL RASH Items with * indicate a potential emergency and should be followed up as soon as possible.  Feel free to call the clinic you have any questions or concerns. The clinic phone number is [phone].  Please show the CHEMO ALERT CARD at check-in to the Emergency Department and triage nurse.   

## 2016-04-19 ENCOUNTER — Other Ambulatory Visit: Payer: Self-pay | Admitting: Nurse Practitioner

## 2016-04-19 ENCOUNTER — Ambulatory Visit (HOSPITAL_COMMUNITY)
Admission: RE | Admit: 2016-04-19 | Discharge: 2016-04-19 | Disposition: A | Discharge status: Home / Self Care | Payer: Medicare Other | Source: Ambulatory Visit | Attending: Oncology | Admitting: Oncology

## 2016-04-19 ENCOUNTER — Ambulatory Visit (HOSPITAL_BASED_OUTPATIENT_CLINIC_OR_DEPARTMENT_OTHER): Payer: Medicare Other | Admitting: Nurse Practitioner

## 2016-04-19 ENCOUNTER — Ambulatory Visit (HOSPITAL_BASED_OUTPATIENT_CLINIC_OR_DEPARTMENT_OTHER): Payer: Medicare Other

## 2016-04-19 ENCOUNTER — Encounter: Payer: Self-pay | Admitting: Nurse Practitioner

## 2016-04-19 ENCOUNTER — Telehealth: Payer: Self-pay | Admitting: *Deleted

## 2016-04-19 VITALS — BP 110/53 | HR 62 | Temp 97.7°F | Resp 17 | Ht 69.0 in | Wt 176.5 lb

## 2016-04-19 DIAGNOSIS — C50211 Malignant neoplasm of upper-inner quadrant of right female breast: Secondary | ICD-10-CM

## 2016-04-19 DIAGNOSIS — M4186 Other forms of scoliosis, lumbar region: Secondary | ICD-10-CM | POA: Insufficient documentation

## 2016-04-19 DIAGNOSIS — I700 Atherosclerosis of aorta: Secondary | ICD-10-CM | POA: Insufficient documentation

## 2016-04-19 DIAGNOSIS — K5900 Constipation, unspecified: Secondary | ICD-10-CM

## 2016-04-19 LAB — COMPREHENSIVE METABOLIC PANEL
ALT: 50 U/L (ref 0–55)
AST: 42 U/L — AB (ref 5–34)
Albumin: 4 g/dL (ref 3.5–5.0)
Alkaline Phosphatase: 144 U/L (ref 40–150)
Anion Gap: 12 mEq/L — ABNORMAL HIGH (ref 3–11)
BUN: 12.8 mg/dL (ref 7.0–26.0)
CALCIUM: 9.7 mg/dL (ref 8.4–10.4)
CHLORIDE: 103 meq/L (ref 98–109)
CO2: 24 meq/L (ref 22–29)
CREATININE: 0.8 mg/dL (ref 0.6–1.1)
EGFR: 72 mL/min/{1.73_m2} — ABNORMAL LOW (ref >90)
GLUCOSE: 122 mg/dL (ref 70–140)
POTASSIUM: 4.6 meq/L (ref 3.5–5.1)
SODIUM: 139 meq/L (ref 136–145)
Total Bilirubin: 0.71 mg/dL (ref 0.20–1.20)
Total Protein: 7.2 g/dL (ref 6.4–8.3)

## 2016-04-19 MED ORDER — OMEPRAZOLE 40 MG PO CPDR: 40.0000 mg | DELAYED_RELEASE_CAPSULE | Freq: Every day | ORAL | 2 refills | Status: DC

## 2016-04-19 NOTE — Telephone Encounter (Signed)
"  I have not had a BM in eight days.  I called and was told to use Gwendolyn Lima.  I can't take this anymore.  This morning I used a gloved finger to remove some of what's there because I can't sit or walk it was so uncomfortable and  There was blood when I tried to remove the stool.  The pressure is relieved.  I've used Linzess in the past so constipation is not new for me.  Abdomen is bloated, I have a sour stomach with burning indigestion under ribs.  Also nausea.  I take anti-emetics for  The nausea but still have indigestion.  Verbal order received and read back from Dr. Jana Hakim for patient to have KUB and be seen in Symptom Management today.  Scheduling message sent, KUB ordered and patient notified.  Getting dressed to drive from [place].  "I'll come in an hour to radiology."   Advised to come to [facility] as soon as KUB completed.

## 2016-04-19 NOTE — Progress Notes (Signed)
SYMPTOM MANAGEMENT CLINIC    Chief Complaint: Constipation  HPI:  Marissa Hall 75 y.o. female diagnosed with breast cancer.  Currently undergoing AC chemotherapy regimen.     Breast cancer of upper-inner quadrant of right female breast ([place])   02/29/2016 Initial Diagnosis    Right breast palpable mass (with silicone implants 2542), 3.5 cm on MRI, additional 3 cm anterior linear enhancement? 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 Ki-67 60%, T2 N0 stage 2A clinical stage      03/14/2016 Procedure    Right breast biopsy upper inner quadrant: IDC grade 3      03/28/2016 -  Neo-Adjuvant Chemotherapy    Neoadjuvant chemotherapy with dose dense Adriamycin and Cytoxan followed by Abraxane weekly 12 ( patient is diabetic and cannot take steroids)       Review of Systems  Gastrointestinal: Positive for constipation.  All other systems reviewed and are negative.   Past Medical History:  Diagnosis Date  . Anxiety   . Depression   . Diabetes ([place])   . Hypertension   . Hypothyroidism   . TIA (transient ischemic attack)     Past Surgical History:  Procedure Laterality Date  . ABDOMINAL HYSTERECTOMY  1972  . APPENDECTOMY  1972  . BREAST ENHANCEMENT SURGERY  1982  . BREAST SURGERY     breast biopsy  . PORTACATH PLACEMENT N/A 03/15/2016   Procedure: INSERTION PORT-A-CATH WITH [place];  Surgeon: Alphonsa Overall, MD;  Location: [facility];  Service: General;  Laterality: N/A;  . surgical repair left ankle  2009    has Breast cancer of upper-inner quadrant of right female breast ([place]) and Constipation on her problem list.    is allergic to other and penicillins.    Medication List       Accurate as of 04/19/16  5:31 PM. Always use your most recent med list.          atorvastatin 80 MG tablet Commonly known as:  LIPITOR Take 80 mg by mouth every evening.   azelastine 0.1 % nasal spray Commonly known as:  ASTELIN Place 2 sprays into both nostrils daily as needed for  rhinitis or allergies. Use in each nostril as directed   gabapentin 100 MG capsule Commonly known as:  NEURONTIN Take 200 mg by mouth at bedtime.   HYDROcodone-acetaminophen 5-325 MG tablet Commonly known as:  NORCO/VICODIN Take 1-2 tablets by mouth every 6 (six) hours as needed.   levothyroxine 100 MCG tablet Commonly known as:  SYNTHROID, LEVOTHROID Take 100 mcg by mouth daily before breakfast.   lidocaine-prilocaine cream Commonly known as:  EMLA Apply to affected area once   lisinopril 20 MG tablet Commonly known as:  PRINIVIL,ZESTRIL Take 20 mg by mouth every evening.   LORazepam 0.5 MG tablet Commonly known as:  ATIVAN Take 1 tablet (0.5 mg total) by mouth every 6 (six) hours as needed (Nausea or vomiting).   magic mouthwash w/lidocaine Soln 5-60m by mouth 4 times a day as needed. Swish, swallow, or spit   metFORMIN 500 MG 24 hr tablet Commonly known as:  GLUCOPHAGE-XR Take 1,000 mg by mouth daily with supper.   ondansetron 8 MG tablet Commonly known as:  ZOFRAN Take 1 tablet (8 mg total) by mouth 2 (two) times daily as needed. Start on the third day after chemotherapy.   prochlorperazine 10 MG tablet Commonly known as:  COMPAZINE Take 1 tablet (10 mg total) by mouth every 6 (six) hours as needed (Nausea or vomiting).  Vitamin D (Ergocalciferol) 50000 units Caps capsule Commonly known as:  DRISDOL Take 50,000 Units by mouth every 7 (seven) days. Saturdays        PHYSICAL EXAMINATION  Oncology Vitals 04/19/2016 04/11/2016  Height 175 cm -  Weight 80.06 kg -  Weight (lbs) 176 lbs 8 oz -  BMI (kg/m2) 26.06 kg/m2 -  Temp 97.7 97.9  Pulse 62 55  Resp 17 18  SpO2 100 99  BSA (m2) 1.97 m2 -   BP Readings from Last 2 Encounters:  04/19/16 (!) 110/53  04/11/16 122/61    Physical Exam  Constitutional: She is oriented to person, place, and time and well-developed, well-nourished, and in no distress.  HENT:  Head: Normocephalic and atraumatic.    Mouth/Throat: Oropharynx is clear and moist.  Eyes: Conjunctivae and EOM are normal. Pupils are equal, round, and reactive to light. Right eye exhibits no discharge. Left eye exhibits no discharge. No scleral icterus.  Neck: Normal range of motion. Neck supple. No JVD present. No tracheal deviation present. No thyromegaly present.  Cardiovascular: Normal rate, regular rhythm, normal heart sounds and intact distal pulses.   Pulmonary/Chest: Effort normal and breath sounds normal. No respiratory distress. She has no wheezes. She has no rales. She exhibits no tenderness.  Abdominal: Soft. Bowel sounds are normal. She exhibits distension. She exhibits no mass. There is no tenderness. There is no rebound and no guarding.  Musculoskeletal: Normal range of motion. She exhibits no edema or tenderness.  Lymphadenopathy:    She has no cervical adenopathy.  Neurological: She is alert and oriented to person, place, and time. Gait normal.  Skin: Skin is warm and dry. No rash noted. No erythema. No pallor.  Psychiatric: Affect normal.  Nursing note and vitals reviewed.   LABORATORY DATA:. Appointment on 04/19/2016  Component Date Value Ref Range Status  . Sodium 04/19/2016 139  136 - 145 mEq/L Final  . Potassium 04/19/2016 4.6  3.5 - 5.1 mEq/L Final  . Chloride 04/19/2016 103  98 - 109 mEq/L Final  . CO2 04/19/2016 24  22 - 29 mEq/L Final  . Glucose 04/19/2016 122  70 - 140 mg/dl Final  . BUN 04/19/2016 12.8  7.0 - 26.0 mg/dL Final  . Creatinine 04/19/2016 0.8  0.6 - 1.1 mg/dL Final  . Total Bilirubin 04/19/2016 0.71  0.20 - 1.20 mg/dL Final  . Alkaline Phosphatase 04/19/2016 144  40 - 150 U/L Final  . AST 04/19/2016 42* 5 - 34 U/L Final  . ALT 04/19/2016 50  0 - 55 U/L Final  . Total Protein 04/19/2016 7.2  6.4 - 8.3 g/dL Final  . Albumin 04/19/2016 4.0  3.5 - 5.0 g/dL Final  . Calcium 04/19/2016 9.7  8.4 - 10.4 mg/dL Final  . Anion Gap 04/19/2016 12* 3 - 11 mEq/L Final  . EGFR 04/19/2016 72*  >90 ml/min/1.73 m2 Final    RADIOGRAPHIC STUDIES: Dg Abd 1 View  Result Date: 04/19/2016 CLINICAL DATA:  75 year old female with constipation. No bowel movement in 8 days. Initial encounter. EXAM: ABDOMEN - 1 VIEW COMPARISON:  None. FINDINGS: Moderate stool throughout the colon consistent was patient's history of constipation. Left upper quadrant gas-filled slightly prominent size small bowel loop with minimally thickened folds, possibly reactive in origin. The possibility of free intraperitoneal air cannot be assessed on a supine view. Scoliosis lumbar spine convex left. Calcified aorta. IMPRESSION: Moderate stool throughout the colon consistent was patient's history of constipation. Left upper quadrant gas-filled slightly prominent size small  bowel loop with minimally thickened folds, possibly reactive in origin. Electronically Signed   By: Genia Del M.D.   On: 04/19/2016 10:11    ASSESSMENT/PLAN:    Constipation Patient states that she has not had a regular/normal bowel movement for approximately 8 days.  She states that she has tried marijuana once per day for the last few days; and has also tried Dulcolax tablets.  She states that she felt impacted; and digitally tried to disimpact herself earlier today.  She had some scant bright red bleeding after trying to digitally disimpact herself; but states there is no further bleeding whatsoever.  She denies any nausea or vomiting.  She denies any recent fevers or chills.  Exam today reveals abdomen is mildly distended; but remained soft.  KUB obtained today revealed moderate constipation; but no bowel obstruction.  On discussion with the patient regarding the need to increase the senna-S to twice daily.  Encouraged patient to increase the senna S as needed to clear constipation.  Also, patient may take the Mira lax every 2 hours as needed to clear constipation as well.  She was encouraged to consider prunes or prune juice as well.  Also, advised  patient that inserting any suppositories or digital disimpaction is advised against-considering that patient is actively receiving chemotherapy.  Patient stated understanding of all instructions; was in agreement with plan of care.  Also, patient requested and was given a refill of her omeprazole.    Breast cancer of upper-inner quadrant of right female breast [facility]) Patient received cycle 2 of her AC chemotherapy regimen on 04/11/2016.  Patient presented to the [facility] today with complaint of constipation.  See further notes for details.  Was unable to obtain enough blood to run a CBC today; but the complete metabolic panel was essentially within normal limits.  Patient is scheduled to return for labs, flush, visit, and chemotherapy again on 04/25/2016.   Patient stated understanding of all instructions; and was in agreement with this plan of care. The patient knows to call the clinic with any problems, questions or concerns.   Total time spent with patient was 25 minutes;  with greater than 75 percent of that time spent in face to face counseling regarding patient's symptoms,  and coordination of care and follow up.  Disclaimer:This dictation was prepared with Dragon/digital dictation along with [employer]. Any transcriptional errors that result from this process are unintentional.  Drue Second, NP 04/19/2016

## 2016-04-19 NOTE — Assessment & Plan Note (Signed)
Patient states that she has not had a regular/normal bowel movement for approximately 8 days.  She states that she has tried marijuana once per day for the last few days; and has also tried Dulcolax tablets.  She states that she felt impacted; and digitally tried to disimpact herself earlier today.  She had some scant bright red bleeding after trying to digitally disimpact herself; but states there is no further bleeding whatsoever.  She denies any nausea or vomiting.  She denies any recent fevers or chills.  Exam today reveals abdomen is mildly distended; but remained soft.  KUB obtained today revealed moderate constipation; but no bowel obstruction.  On discussion with the patient regarding the need to increase the senna-S to twice daily.  Encouraged patient to increase the senna S as needed to clear constipation.  Also, patient may take the Mira lax every 2 hours as needed to clear constipation as well.  She was encouraged to consider prunes or prune juice as well.  Also, advised patient that inserting any suppositories or digital disimpaction is advised against-considering that patient is actively receiving chemotherapy.  Patient stated understanding of all instructions; was in agreement with plan of care.  Also, patient requested and was given a refill of her omeprazole.

## 2016-04-19 NOTE — Assessment & Plan Note (Signed)
Patient received cycle 2 of her [facility] chemotherapy regimen on 04/11/2016.  Patient presented to the [facility] today with complaint of constipation.  See further notes for details.  Was unable to obtain enough blood to run a CBC today; but the complete metabolic panel was essentially within normal limits.  Patient is scheduled to return for labs, flush, visit, and chemotherapy again on 04/25/2016.

## 2016-04-22 ENCOUNTER — Telehealth: Payer: Self-pay | Admitting: *Deleted

## 2016-04-22 NOTE — Telephone Encounter (Signed)
TCT patient to follow up on [facility] visit on 04/19/16 for constipation. Pt had gone 8 days w/o a bowel movement. Pt states she is taking Senna S-1 in the am and 1-2 in pm with small to moderate amount stool each day.  Pt doesn't feel like she has completely re;ieved the constipation.  She only took 1 Senna S this am.  Advised pt to take another one now and then 2 at night. Then she is to see how effective that has been in the morning.  Encouraged pt to increase water/fluid intake as well as fruits and vegetables. She can continue senna s at 2 tabs BID until she has a bit of loose stools, then she can back off.  Pt voiced understanding.  She states she has a long history of constipation so she is not surprised that she needs more Senna s.  Encouraged pt to call back if no relief over the weekend.  Pt agreeable to this.  No other questions or concerns.

## 2016-04-24 NOTE — Assessment & Plan Note (Signed)
02/29/2016: Right breast palpable mass (with silicone implants 3014), 3.5 cm on MRI, additional 3 cm anterior linear enhancement (biopsy 03/14/2016 IDC grade 3); 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  T2 N0 stage 2A clinical stage  Recommendation: 1. Neoadjuvant chemotherapy with dose dense Adriamycin and Cytoxan 4 followed by Abraxane weekly 12 (patient is diabetic and cannot takesteroids) 2. followed by breast conserving surgery (with plastic surgery help regarding implant rupture) 3. Followed by radiation therapy ----------------------------------------------------------------------------------------------------------------------------------------- Current treatment: Cycle 1 day 8 dose dense Adriamycin and Cytoxan Echocardiogram 03/25/2016: EF 60-65% Labs reviewed Chemotherapy toxicities: 1. Grade 3 neutropenia: ANC 0 with cycle 1 day 8 : I decreased the dosage of cycle 2 of chemotherapy  2. grade 2 nausea: Instructed her to take stool softeners and proton pump inhibitors along with antinausea medications. Instructed her to take half a tablet of Compazine because it was making her drowsy.  3. Fatigue: Due to chemotherapy   Closely monitoring for chemotherapy toxicities.  Diabetes mellitus: I instructed her about strict glycemic control.  Mildly elevated AST and ALT: Related to alcohol consumption. I instructed her that she cannot drink any alcohol. The  Numbers are improving   RTC in 3 weeks for cycle 4

## 2016-04-25 ENCOUNTER — Encounter: Payer: Self-pay | Admitting: *Deleted

## 2016-04-25 ENCOUNTER — Ambulatory Visit (HOSPITAL_BASED_OUTPATIENT_CLINIC_OR_DEPARTMENT_OTHER): Payer: Medicare Other

## 2016-04-25 ENCOUNTER — Ambulatory Visit: Payer: Medicare Other

## 2016-04-25 ENCOUNTER — Encounter: Payer: Self-pay | Admitting: Hematology and Oncology

## 2016-04-25 ENCOUNTER — Ambulatory Visit (HOSPITAL_BASED_OUTPATIENT_CLINIC_OR_DEPARTMENT_OTHER): Payer: Medicare Other | Admitting: Hematology and Oncology

## 2016-04-25 ENCOUNTER — Other Ambulatory Visit (HOSPITAL_BASED_OUTPATIENT_CLINIC_OR_DEPARTMENT_OTHER): Payer: Medicare Other

## 2016-04-25 DIAGNOSIS — Z171 Estrogen receptor negative status [ER-]: Principal | ICD-10-CM

## 2016-04-25 DIAGNOSIS — Z5111 Encounter for antineoplastic chemotherapy: Secondary | ICD-10-CM | POA: Diagnosis not present

## 2016-04-25 DIAGNOSIS — C50919 Malignant neoplasm of unspecified site of unspecified female breast: Secondary | ICD-10-CM

## 2016-04-25 DIAGNOSIS — E119 Type 2 diabetes mellitus without complications: Secondary | ICD-10-CM

## 2016-04-25 DIAGNOSIS — R7989 Other specified abnormal findings of blood chemistry: Secondary | ICD-10-CM | POA: Diagnosis not present

## 2016-04-25 DIAGNOSIS — C50211 Malignant neoplasm of upper-inner quadrant of right female breast: Secondary | ICD-10-CM

## 2016-04-25 LAB — COMPREHENSIVE METABOLIC PANEL
ALT: 50 U/L (ref 0–55)
ANION GAP: 9 meq/L (ref 3–11)
AST: 46 U/L — AB (ref 5–34)
Albumin: 3.6 g/dL (ref 3.5–5.0)
Alkaline Phosphatase: 133 U/L (ref 40–150)
BUN: 11 mg/dL (ref 7.0–26.0)
CHLORIDE: 105 meq/L (ref 98–109)
CO2: 25 meq/L (ref 22–29)
CREATININE: 0.7 mg/dL (ref 0.6–1.1)
Calcium: 9.6 mg/dL (ref 8.4–10.4)
EGFR: 83 mL/min/{1.73_m2} — ABNORMAL LOW (ref >90)
GLUCOSE: 134 mg/dL (ref 70–140)
Potassium: 4.1 mEq/L (ref 3.5–5.1)
SODIUM: 139 meq/L (ref 136–145)
TOTAL PROTEIN: 6.5 g/dL (ref 6.4–8.3)
Total Bilirubin: 0.49 mg/dL (ref 0.20–1.20)

## 2016-04-25 LAB — CBC WITH DIFFERENTIAL/PLATELET
BASO%: 0.6 % (ref 0.0–2.0)
Basophils Absolute: 0.1 10*3/uL (ref 0.0–0.1)
EOS%: 0.1 % (ref 0.0–7.0)
Eosinophils Absolute: 0 10*3/uL (ref 0.0–0.5)
HCT: 34.2 % — ABNORMAL LOW (ref 34.8–46.6)
HGB: 11.5 g/dL — ABNORMAL LOW (ref 11.6–15.9)
LYMPH%: 10.1 % — ABNORMAL LOW (ref 14.0–49.7)
MCH: 32.2 pg (ref 25.1–34.0)
MCHC: 33.6 g/dL (ref 31.5–36.0)
MCV: 95.8 fL (ref 79.5–101.0)
MONO#: 0.9 10*3/uL (ref 0.1–0.9)
MONO%: 7.6 % (ref 0.0–14.0)
NEUT#: 9.1 10*3/uL — ABNORMAL HIGH (ref 1.5–6.5)
NEUT%: 81.6 % — ABNORMAL HIGH (ref 38.4–76.8)
Platelets: 143 10*3/uL — ABNORMAL LOW (ref 145–400)
RBC: 3.57 10*6/uL — ABNORMAL LOW (ref 3.70–5.45)
RDW: 14 % (ref 11.2–14.5)
WBC: 11.1 10*3/uL — ABNORMAL HIGH (ref 3.9–10.3)
lymph#: 1.1 10*3/uL (ref 0.9–3.3)
nRBC: 0 % (ref 0–0)

## 2016-04-25 MED ORDER — PALONOSETRON HCL INJECTION 0.25 MG/5ML
INTRAVENOUS | Status: AC
  Filled 2016-04-25: qty 5

## 2016-04-25 MED ORDER — PALONOSETRON HCL INJECTION 0.25 MG/5ML
0.2500 mg | Freq: Once | INTRAVENOUS | Status: AC
  Administered 2016-04-25: 0.25 mg via INTRAVENOUS

## 2016-04-25 MED ORDER — SODIUM CHLORIDE 0.9 % IV SOLN
400.0000 mg/m2 | Freq: Once | INTRAVENOUS | Status: AC
  Administered 2016-04-25: 800 mg via INTRAVENOUS
  Filled 2016-04-25: qty 40

## 2016-04-25 MED ORDER — DOXORUBICIN HCL CHEMO IV INJECTION 2 MG/ML
40.0000 mg/m2 | Freq: Once | INTRAVENOUS | Status: AC
  Administered 2016-04-25: 80 mg via INTRAVENOUS
  Filled 2016-04-25: qty 40

## 2016-04-25 MED ORDER — HEPARIN SOD (PORK) LOCK FLUSH 100 UNIT/ML IV SOLN
500.0000 [IU] | Freq: Once | INTRAVENOUS | Status: AC | PRN
  Administered 2016-04-25: 500 [IU]
  Filled 2016-04-25: qty 5

## 2016-04-25 MED ORDER — SODIUM CHLORIDE 0.9 % IV SOLN
Freq: Once | INTRAVENOUS | Status: AC
  Administered 2016-04-25: 11:00:00 via INTRAVENOUS

## 2016-04-25 MED ORDER — SODIUM CHLORIDE 0.9% FLUSH
10.0000 mL | INTRAVENOUS | Status: DC | PRN
  Administered 2016-04-25: 10 mL
  Filled 2016-04-25: qty 10

## 2016-04-25 MED ORDER — SODIUM CHLORIDE 0.9 % IV SOLN
Freq: Once | INTRAVENOUS | Status: AC
  Administered 2016-04-25: 12:00:00 via INTRAVENOUS
  Filled 2016-04-25: qty 5

## 2016-04-25 MED ORDER — PEGFILGRASTIM 6 MG/0.6ML ~~LOC~~ PSKT
6.0000 mg | PREFILLED_SYRINGE | Freq: Once | SUBCUTANEOUS | Status: AC
  Administered 2016-04-25: 6 mg via SUBCUTANEOUS
  Filled 2016-04-25: qty 0.6

## 2016-04-25 MED ORDER — SODIUM CHLORIDE 0.9% FLUSH
10.0000 mL | Freq: Once | INTRAVENOUS | Status: AC
  Administered 2016-04-25: 10 mL via INTRAVENOUS
  Filled 2016-04-25: qty 10

## 2016-04-25 NOTE — Patient Instructions (Signed)
[facility] Discharge Instructions for Patients Receiving Chemotherapy  Today you received the following chemotherapy agents: Adriamycin and Cytoxan.  To help prevent nausea and vomiting after your treatment, we encourage you to take your nausea medication: Compazine. Take one every 6 hours as needed. If you develop nausea and vomiting that is not controlled by your nausea medication, call the clinic.   BELOW ARE SYMPTOMS THAT SHOULD BE REPORTED IMMEDIATELY:  *FEVER GREATER THAN 100.5 F  *CHILLS WITH OR WITHOUT FEVER  NAUSEA AND VOMITING THAT IS NOT CONTROLLED WITH YOUR NAUSEA MEDICATION  *UNUSUAL SHORTNESS OF BREATH  *UNUSUAL BRUISING OR BLEEDING  TENDERNESS IN MOUTH AND THROAT WITH OR WITHOUT PRESENCE OF ULCERS  *URINARY PROBLEMS  *BOWEL PROBLEMS  UNUSUAL RASH Items with * indicate a potential emergency and should be followed up as soon as possible.  Feel free to call the clinic should you have any questions or concerns. The clinic phone number is [phone].  Please show the [facility] at check-in to the Emergency Department and triage nurse.

## 2016-04-25 NOTE — Patient Instructions (Signed)

## 2016-04-25 NOTE — Progress Notes (Signed)
Patient Care Team: Estill Bamberg. [name], MD as PCP - General (Internal Medicine) Alphonsa Overall, MD as Consulting Physician (General Surgery) Nicholas Lose, MD as Consulting Physician (Hematology and Oncology) Kyung Rudd, MD as Consulting Physician (Radiation Oncology)  DIAGNOSIS:  Encounter Diagnosis  Name Primary?  . Malignant neoplasm of upper-inner quadrant of right breast in female, estrogen receptor negative ([place])     SUMMARY OF ONCOLOGIC HISTORY:   Breast cancer of upper-inner quadrant of right female breast ([place])   02/29/2016 Initial Diagnosis    Right breast palpable mass (with silicone implants 7116), 3.5 cm on MRI, additional 3 cm anterior linear enhancement? 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 Ki-67 60%, T2 N0 stage 2A clinical stage      03/14/2016 Procedure    Right breast biopsy upper inner quadrant: IDC grade 3      03/28/2016 -  Neo-Adjuvant Chemotherapy    Neoadjuvant chemotherapy with dose dense Adriamycin and Cytoxan followed by Abraxane weekly 12 ( patient is diabetic and cannot take steroids)       CHIEF COMPLIANT: Cycle 3 neoadjuvant chemotherapy with Adriamycin Cytoxan  INTERVAL HISTORY: Marissa Hall is a 75 year old with above-mentioned history of right breast cancer currently on neoadjuvant chemotherapy with dose dense Adriamycin and Cytoxan for triple negative disease. She is here for cycle 3. Overall she tolerated cycle 2 reasonably well. She did have nausea that was treated previously as gastritis with proton pump inhibitors. She also experiences fatigue related to chemotherapy. She had impaction of stools and required taking large dose of Senokot which finally appears to have helped her.  REVIEW OF SYSTEMS:   Constitutional: Denies fevers, chills or abnormal weight loss Eyes: Denies blurriness of vision Ears, nose, mouth, throat, and face: Denies mucositis or sore throat Respiratory: Denies cough, dyspnea or wheezes Cardiovascular:  Denies palpitation, chest discomfort Gastrointestinal:  Nausea and severe constipation Skin: Denies abnormal skin rashes Lymphatics: Denies new lymphadenopathy or easy bruising Neurological:Denies numbness, tingling or new weaknesses Behavioral/Psych: Mood is stable, no new changes  Extremities: No lower extremity edema All other systems were reviewed with the patient and are negative.  I have reviewed the past medical history, past surgical history, social history and family history with the patient and they are unchanged from previous note.  ALLERGIES:  is allergic to other and penicillins.  MEDICATIONS:  Current Outpatient Prescriptions  Medication Sig Dispense Refill  . atorvastatin (LIPITOR) 80 MG tablet Take 80 mg by mouth every evening.     [name] Kitchen azelastine (ASTELIN) 0.1 % nasal spray Place 2 sprays into both nostrils daily as needed for rhinitis or allergies. Use in each nostril as directed    . gabapentin (NEURONTIN) 100 MG capsule Take 200 mg by mouth at bedtime.    [name] Kitchen HYDROcodone-acetaminophen (NORCO/VICODIN) 5-325 MG tablet Take 1-2 tablets by mouth every 6 (six) hours as needed. (Patient not taking: Reported on 04/19/2016) 20 tablet 0  . levothyroxine (SYNTHROID, LEVOTHROID) 100 MCG tablet Take 100 mcg by mouth daily before breakfast.     . lidocaine-prilocaine (EMLA) cream Apply to affected area once 30 g 3  . lisinopril (PRINIVIL,ZESTRIL) 20 MG tablet Take 20 mg by mouth every evening.     [name] Kitchen LORazepam (ATIVAN) 0.5 MG tablet Take 1 tablet (0.5 mg total) by mouth every 6 (six) hours as needed (Nausea or vomiting). 30 tablet 0  . magic mouthwash w/lidocaine SOLN 5-31m by mouth 4 times a day as needed. Swish, swallow, or spit 240 mL 1  .  metFORMIN (GLUCOPHAGE-XR) 500 MG 24 hr tablet Take 1,000 mg by mouth daily with supper.     [name] Kitchen omeprazole (PRILOSEC) 40 MG capsule Take 1 capsule (40 mg total) by mouth daily. 30 capsule 2  . ondansetron (ZOFRAN) 8 MG tablet Take 1 tablet (8 mg  total) by mouth 2 (two) times daily as needed. Start on the third day after chemotherapy. (Patient not taking: Reported on 04/19/2016) 30 tablet 1  . prochlorperazine (COMPAZINE) 10 MG tablet Take 1 tablet (10 mg total) by mouth every 6 (six) hours as needed (Nausea or vomiting). 30 tablet 1  . Vitamin D, Ergocalciferol, (DRISDOL) 50000 units CAPS capsule Take 50,000 Units by mouth every 7 (seven) days. Saturdays     No current facility-administered medications for this visit.     PHYSICAL EXAMINATION: ECOG PERFORMANCE STATUS: 1 - Symptomatic but completely ambulatory  Vitals:   04/25/16 1000  BP: (!) 142/78  Pulse: (!) 56  Resp: 18  Temp: 97.8 F (36.6 C)   Filed Weights   04/25/16 1000  Weight: 179 lb 3.2 oz (81.3 kg)    GENERAL:alert, no distress and comfortable SKIN: skin color, texture, turgor are normal, no rashes or significant lesions EYES: normal, Conjunctiva are pink and non-injected, sclera clear OROPHARYNX:no exudate, no erythema and lips, buccal mucosa, and tongue normal  NECK: supple, thyroid normal size, non-tender, without nodularity LYMPH:  no palpable lymphadenopathy in the cervical, axillary or inguinal LUNGS: clear to auscultation and percussion with normal breathing effort HEART: regular rate & rhythm and no murmurs and no lower extremity edema ABDOMEN:abdomen soft, non-tender and normal bowel sounds MUSCULOSKELETAL:no cyanosis of digits and no clubbing  NEURO: alert & oriented x 3 with fluent speech, no focal motor/sensory deficits EXTREMITIES: No lower extremity edema  LABORATORY DATA:  I have reviewed the data as listed   Chemistry      Component Value Date/Time   NA 139 04/25/2016 0918   K 4.1 04/25/2016 0918   CL 103 03/16/2016 0506   CO2 25 04/25/2016 0918   BUN 11.0 04/25/2016 0918   CREATININE 0.7 04/25/2016 0918      Component Value Date/Time   CALCIUM 9.6 04/25/2016 0918   ALKPHOS 133 04/25/2016 0918   AST 46 (H) 04/25/2016 0918   ALT  50 04/25/2016 0918   BILITOT 0.49 04/25/2016 0918       Lab Results  Component Value Date   WBC 11.1 (H) 04/25/2016   HGB 11.5 (L) 04/25/2016   HCT 34.2 (L) 04/25/2016   MCV 95.8 04/25/2016   PLT 143 (L) 04/25/2016   NEUTROABS 9.1 (H) 04/25/2016     ASSESSMENT & PLAN:  Breast cancer of upper-inner quadrant of right female breast ([place]) 02/29/2016: Right breast palpable mass (with silicone implants 1610), 3.5 cm on MRI, additional 3 cm anterior linear enhancement (biopsy 03/14/2016 IDC grade 3); 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  T2 N0 stage 2A clinical stage  Recommendation: 1. Neoadjuvant chemotherapy with dose dense Adriamycin and Cytoxan 4 followed by Abraxane weekly 12 (patient is diabetic and cannot takesteroids) 2. followed by breast conserving surgery (with plastic surgery help regarding implant rupture) 3. Followed by radiation therapy ----------------------------------------------------------------------------------------------------------------------------------------- Current treatment: Cycle 3 dose dense Adriamycin and Cytoxan Echocardiogram 03/25/2016: EF 60-65% Labs reviewed  Chemotherapy toxicities: 1. Grade 3 neutropenia: ANC 0 with cycle 1 day 8 : I decreased the dosage of cycle 2 of chemotherapy  2. grade 2 nausea: Instructed her to take stool softeners and proton pump inhibitors  along with antinausea medications. Instructed her to take half a tablet of Compazine because it was making her drowsy.  3. Fatigue: Due to chemotherapy  4. Severe constipation: Did not respond to MiraLAX. However Senokot appeared to have worked. She is taking 4 tablets of Senokot every day. She'll increase the dosage during chemotherapy.  Closely monitoring for chemotherapy toxicities.  Diabetes mellitus: I instructed her about strict glycemic control.  Mildly elevated AST and ALT: Related to alcohol consumption. I instructed her that she cannot drink any alcohol. The   Numbers arestable patient wishes to remain active and work 2 days a week.  RTC in 2 weeks for cycle 4  No orders of the defined types were placed in this encounter.  The patient has a good understanding of the overall plan. she agrees with it. she will call with any problems that may develop before the next visit here.   Rulon Eisenmenger, MD 04/25/16

## 2016-05-06 ENCOUNTER — Other Ambulatory Visit (HOSPITAL_COMMUNITY): Payer: Self-pay | Admitting: *Deleted

## 2016-05-06 ENCOUNTER — Ambulatory Visit (HOSPITAL_BASED_OUTPATIENT_CLINIC_OR_DEPARTMENT_OTHER)
Admission: RE | Admit: 2016-05-06 | Discharge: 2016-05-06 | Disposition: A | Discharge status: Home / Self Care | Payer: Medicare Other | Source: Ambulatory Visit | Attending: Internal Medicine | Admitting: Internal Medicine

## 2016-05-06 ENCOUNTER — Ambulatory Visit (HOSPITAL_COMMUNITY)
Admission: RE | Admit: 2016-05-06 | Discharge: 2016-05-06 | Disposition: A | Discharge status: Home / Self Care | Payer: Medicare Other | Source: Ambulatory Visit | Attending: Internal Medicine | Admitting: Internal Medicine

## 2016-05-06 VITALS — BP 152/80 | HR 70 | Resp 18 | Wt 178.5 lb

## 2016-05-06 DIAGNOSIS — Z87891 Personal history of nicotine dependence: Secondary | ICD-10-CM | POA: Diagnosis not present

## 2016-05-06 DIAGNOSIS — Z8673 Personal history of transient ischemic attack (TIA), and cerebral infarction without residual deficits: Secondary | ICD-10-CM | POA: Diagnosis not present

## 2016-05-06 DIAGNOSIS — I10 Essential (primary) hypertension: Secondary | ICD-10-CM | POA: Diagnosis not present

## 2016-05-06 DIAGNOSIS — Z171 Estrogen receptor negative status [ER-]: Secondary | ICD-10-CM | POA: Diagnosis not present

## 2016-05-06 DIAGNOSIS — E119 Type 2 diabetes mellitus without complications: Secondary | ICD-10-CM | POA: Diagnosis not present

## 2016-05-06 DIAGNOSIS — R079 Chest pain, unspecified: Secondary | ICD-10-CM | POA: Insufficient documentation

## 2016-05-06 DIAGNOSIS — Z9882 Breast implant status: Secondary | ICD-10-CM | POA: Diagnosis not present

## 2016-05-06 DIAGNOSIS — F419 Anxiety disorder, unspecified: Secondary | ICD-10-CM | POA: Insufficient documentation

## 2016-05-06 DIAGNOSIS — C50211 Malignant neoplasm of upper-inner quadrant of right female breast: Secondary | ICD-10-CM

## 2016-05-06 DIAGNOSIS — Z7984 Long term (current) use of oral hypoglycemic drugs: Secondary | ICD-10-CM | POA: Insufficient documentation

## 2016-05-06 DIAGNOSIS — F329 Major depressive disorder, single episode, unspecified: Secondary | ICD-10-CM | POA: Insufficient documentation

## 2016-05-06 DIAGNOSIS — E785 Hyperlipidemia, unspecified: Secondary | ICD-10-CM | POA: Insufficient documentation

## 2016-05-06 DIAGNOSIS — E039 Hypothyroidism, unspecified: Secondary | ICD-10-CM | POA: Insufficient documentation

## 2016-05-06 DIAGNOSIS — C50911 Malignant neoplasm of unspecified site of right female breast: Secondary | ICD-10-CM | POA: Diagnosis present

## 2016-05-06 LAB — ECHOCARDIOGRAM COMPLETE
EERAT: 9.28
EWDT: 285 ms
FS: 28 % (ref 28–44)
IVS/LV PW RATIO, ED: 1.3
LA ID, A-P, ES: 37 mm
LA diam index: 1.88 cm/m2
LA vol A4C: 38.3 ml
LEFT ATRIUM END SYS DIAM: 37 mm
LV E/e' medial: 9.28
LV SIMPSON'S DISK: 63
LV TDI E'MEDIAL: 5.77
LV dias vol index: 39 mL/m2
LV sys vol index: 14 mL/m2
LV sys vol: 28 mL (ref 14–42)
LVDIAVOL: 77 mL (ref 46–106)
LVEEAVG: 9.28
LVELAT: 6.53 cm/s
LVOT VTI: 17.4 cm
LVOT area: 2.54 cm2
LVOT diameter: 18 mm
LVOTPV: 86.5 cm/s
LVOTSV: 44 mL
MV Dec: 285
MV pk E vel: 60.6 m/s
MVPKAVEL: 87.3 m/s
PW: 10 mm — AB (ref 0.6–1.1)
RV LATERAL S' VELOCITY: 10.3 cm/s
Stroke v: 48 ml
TDI e' lateral: 6.53

## 2016-05-06 NOTE — Progress Notes (Signed)
[facility] NOTE  Patient Care Team: Estill Bamberg. [name], MD as PCP - General (Internal Medicine) Alphonsa Overall, MD as Consulting Physician (General Surgery) Nicholas Lose, MD as Consulting Physician (Hematology and Oncology) Kyung Rudd, MD as Consulting Physician (Radiation Oncology)    HISTORY OF PRESENTING ILLNESS:  Marissa Hall 75 y.o. 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 Referred by Dr. Lindi Adie for enrollment into the cardio-oncology clinic in setting of right breast cancer.  Recently moved from [place]. Had 2 heart caths in 2000 and 2011 for CP and possible Tako-Tsubo CM. Caths with normal coronary arteries.    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 This however was not biopsied.   Has had 3/4 rounds of AC.  Doing well. No CP or DOE. No edema.   Echo today reviewed personally:  EF 60-65%  Lateral s' 11.8 cm/s GLS 19.1%  EF 10/17 60-65% Grade I DD Lateral s' 12.1 cm/s GLS 16.7%  SUMMARY OF ONCOLOGIC HISTORY:   Breast cancer of upper-inner quadrant of right female breast ([place])   02/29/2016 Initial Diagnosis    Right breast palpable mass (with silicone implants 5784), 3.5 cm on MRI, additional 3 cm anterior linear enhancement? 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 Ki-67 60%, T2 N0 stage 2A clinical stage      03/14/2016 Procedure    Right breast biopsy upper inner quadrant: IDC grade 3      03/28/2016 -  Neo-Adjuvant Chemotherapy    Neoadjuvant chemotherapy with dose dense Adriamycin and Cytoxan followed by Abraxane weekly 12 ( patient is diabetic and cannot take steroids)      MEDICAL HISTORY:    Past Medical History:  Diagnosis Date  . Anxiety   . Depression   . Diabetes ([place])   . Hypertension   . Hypothyroidism   . TIA (transient ischemic attack)     SURGICAL HISTORY: Past Surgical History:  Procedure Laterality Date  . ABDOMINAL HYSTERECTOMY  1972  . APPENDECTOMY  1972  . BREAST ENHANCEMENT SURGERY  1982  . BREAST SURGERY     breast biopsy  . PORTACATH PLACEMENT N/A 03/15/2016   Procedure: INSERTION PORT-A-CATH WITH [place];  Surgeon: Alphonsa Overall, MD;  Location: [facility];  Service: General;  Laterality: N/A;  . surgical repair left ankle  2009    SOCIAL HISTORY: Social History   Social History  . Marital status: Legally Separated    Spouse name: N/A  . Number of children: N/A  . Years of education: N/A   Occupational History  . Not on file.   Social History Main Topics  . Smoking status: Former Smoker    Packs/day: 1.00    Types: Cigarettes    Quit date: 03/08/1999  . Smokeless tobacco: Never Used  . Alcohol use Yes     Comment: wine daily  . Drug use: No  . Sexual activity: Not on file   Other Topics Concern  . Not on file   Social History Narrative  . No narrative on file    FAMILY HISTORY: Father died suicide Mother died of CVA and PNA  No FHx  of premature CAD  ALLERGIES:  is allergic to other and penicillins.  MEDICATIONS:  Current Outpatient Prescriptions  Medication Sig Dispense Refill  . atorvastatin (LIPITOR) 80 MG tablet Take 80 mg by mouth every evening.     [name] Kitchen azelastine (ASTELIN) 0.1 % nasal spray Place 2 sprays into both nostrils daily as needed for rhinitis or allergies. Use in each nostril as directed    . gabapentin (NEURONTIN) 100 MG capsule Take 200 mg by mouth at bedtime.    [name] Kitchen levothyroxine (SYNTHROID, LEVOTHROID) 100 MCG tablet Take 100 mcg by mouth daily before breakfast.     . lidocaine-prilocaine (EMLA) cream Apply to affected area once 30 g 3  . lisinopril (PRINIVIL,ZESTRIL) 20 MG tablet Take 20 mg by mouth every  evening.     [name] Kitchen LORazepam (ATIVAN) 0.5 MG tablet Take 1 tablet (0.5 mg total) by mouth every 6 (six) hours as needed (Nausea or vomiting). 30 tablet 0  . magic mouthwash w/lidocaine SOLN 5-62m by mouth 4 times a day as needed. Swish, swallow, or spit 240 mL 1  . metFORMIN (GLUCOPHAGE-XR) 500 MG 24 hr tablet Take 1,000 mg by mouth daily with supper.     .[name] Kitchenomeprazole (PRILOSEC) 40 MG capsule Take 1 capsule (40 mg total) by mouth daily. 30 capsule 2  . prochlorperazine (COMPAZINE) 10 MG tablet Take 1 tablet (10 mg total) by mouth every 6 (six) hours as needed (Nausea or vomiting). 30 tablet 1  . Vitamin D, Ergocalciferol, (DRISDOL) 50000 units CAPS capsule Take 50,000 Units by mouth every 7 (seven) days. Saturdays     No current facility-administered medications for this encounter.     REVIEW OF SYSTEMS:   Constitutional: Denies fevers, chills or abnormal night sweats Eyes: Denies blurriness of vision, double vision or watery eyes Ears, nose, mouth, throat, and face: Denies mucositis or sore throat Respiratory: Denies cough, dyspnea or wheezes Cardiovascular: Denies palpitation, chest discomfort or lower extremity swelling Gastrointestinal:  Denies nausea, heartburn or change in bowel habits Skin: Denies abnormal skin rashes Lymphatics: Denies new lymphadenopathy or easy bruising Neurological:Denies numbness, tingling or new weaknesses Behavioral/Psych: Mood is stable, no new changes  Breast: Palpable lump in the right breast All other systems were reviewed with the patient and are negative.    Vitals:   05/06/16 1206  BP: (!) 152/80  Pulse: 70  Resp: 18   Filed Weights   05/06/16 1206  Weight: 178 lb 8 oz (81 kg)   Physical Exam:  GENERAL:alert, no distress and comfortable EYES: normal, conjunctiva are pink and non-injected, sclera clear OROPHARYNX:no exudate, no erythema and lips, buccal mucosa, and tongue normal  NECK: supple, thyroid normal size, non-tender, without  nodularity LYMPH:  no palpable lymphadenopathy in the cervical, axillary or inguinal LUNGS: clear to auscultation and percussion with normal breathing effort HEART: regular rate & rhythm soft SEM at RUSB  and no lower extremity edema ABDOMEN: obsese soft, non-tender and normal bowel sounds Musculoskeletal:no cyanosis of digits and no clubbing  PSYCH: alert & oriented x 3 with fluent speech NEURO: no focal motor/sensory deficits  LABORATORY DATA:  I have reviewed the data as listed Lab Results  Component Value Date   WBC 11.1 (H) 04/25/2016   HGB 11.5 (L) 04/25/2016   HCT 34.2 (L) 04/25/2016   MCV 95.8 04/25/2016   PLT 143 (L) 04/25/2016   Lab Results  Component Value Date   NA 139 04/25/2016   K 4.1 04/25/2016   CL 103 03/16/2016   CO2  25 04/25/2016     ASSESSMENT AND PLAN:  1. Right breast CA --triple negative --tolerating AC chemo well --I reviewed echos personally. EF and Doppler parameters stable. No HF on exam. Continue Adriamycin. Will see back in 6 months for repeat echo to ensure stability. I explained incidence of Adriamycin cardiotoxicity in detail include small possibility of very delayed toxicity. 2. H/o chest pain and Tako-Tsubo CM --Cardiac cath in 200 and 2011 with normal coronaries in [place], [place]  --Echo now normal. --Will continue lisinopril. Low threshold to add carvedilol in the future.  3.HTN --BP a little high today. Will follow can titrate meds as needed.    Liesel Peckenpaugh,MD 6:49 PM

## 2016-05-06 NOTE — Progress Notes (Signed)
  Echocardiogram 2D Echocardiogram has been performed.  Tresa Res 05/06/2016, 11:31 AM

## 2016-05-06 NOTE — Patient Instructions (Signed)
Follow up and Echo with Dr.Bensimhon in 6 months.  

## 2016-05-08 NOTE — Assessment & Plan Note (Signed)
02/29/2016: Right breast palpable mass (with silicone implants 0156), 3.5 cm on MRI, additional 3 cm anterior linear enhancement (biopsy 03/14/2016 IDC grade 3); 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  T2 N0 stage 2A clinical stage  Recommendation: 1. Neoadjuvant chemotherapy with dose dense Adriamycin and Cytoxan 4 followed by Abraxane weekly 12 (patient is diabetic and cannot takesteroids) 2. followed by breast conserving surgery (with plastic surgery help regarding implant rupture) 3. Followed by radiation therapy ----------------------------------------------------------------------------------------------------------------------------------------- Current treatment: Cycle 4dose dense Adriamycin and Cytoxan Echocardiogram 03/25/2016: EF 60-65% Labs reviewed  Chemotherapy toxicities: 1. Grade 3 neutropenia: ANC 0 with cycle 1 day 8 : I decreased the dosage of cycle 2 of chemotherapy  2. grade 2 nausea: Instructed her to take stool softeners and proton pump inhibitors along with antinausea medications. Instructed her to take half a tablet of Compazine because it was making her drowsy.  3. Fatigue: Due to chemotherapy  4. Severe constipation: Did not respond to MiraLAX. However Senokot appeared to have worked. She is taking 4 tablets of Senokot every day. She'll increase the dosage during chemotherapy.  Closely monitoring for chemotherapy toxicities.  Diabetes mellitus: I instructed her about strict glycemic control.  Mildly elevated AST and ALT: Related to alcohol consumption. I instructed her that she cannot drink any alcohol. The numbers are stable patient wishes to remain active and work 2 days a week.  RTC in 2 weeks for cycle 1 Abraxane

## 2016-05-09 ENCOUNTER — Other Ambulatory Visit (HOSPITAL_BASED_OUTPATIENT_CLINIC_OR_DEPARTMENT_OTHER): Payer: Medicare Other

## 2016-05-09 ENCOUNTER — Ambulatory Visit (HOSPITAL_BASED_OUTPATIENT_CLINIC_OR_DEPARTMENT_OTHER): Payer: Medicare Other | Admitting: Hematology and Oncology

## 2016-05-09 ENCOUNTER — Ambulatory Visit (HOSPITAL_BASED_OUTPATIENT_CLINIC_OR_DEPARTMENT_OTHER): Payer: Medicare Other

## 2016-05-09 ENCOUNTER — Ambulatory Visit: Payer: Medicare Other

## 2016-05-09 ENCOUNTER — Encounter: Payer: Self-pay | Admitting: Hematology and Oncology

## 2016-05-09 DIAGNOSIS — G629 Polyneuropathy, unspecified: Secondary | ICD-10-CM | POA: Insufficient documentation

## 2016-05-09 DIAGNOSIS — Z95828 Presence of other vascular implants and grafts: Secondary | ICD-10-CM

## 2016-05-09 DIAGNOSIS — Z171 Estrogen receptor negative status [ER-]: Principal | ICD-10-CM

## 2016-05-09 DIAGNOSIS — C50211 Malignant neoplasm of upper-inner quadrant of right female breast: Secondary | ICD-10-CM

## 2016-05-09 DIAGNOSIS — G63 Polyneuropathy in diseases classified elsewhere: Secondary | ICD-10-CM

## 2016-05-09 DIAGNOSIS — E119 Type 2 diabetes mellitus without complications: Secondary | ICD-10-CM

## 2016-05-09 DIAGNOSIS — K5909 Other constipation: Secondary | ICD-10-CM | POA: Diagnosis not present

## 2016-05-09 DIAGNOSIS — R110 Nausea: Secondary | ICD-10-CM

## 2016-05-09 DIAGNOSIS — Z5111 Encounter for antineoplastic chemotherapy: Secondary | ICD-10-CM

## 2016-05-09 LAB — COMPREHENSIVE METABOLIC PANEL
ALBUMIN: 3.7 g/dL (ref 3.5–5.0)
ALK PHOS: 155 U/L — AB (ref 40–150)
ALT: 33 U/L (ref 0–55)
AST: 28 U/L (ref 5–34)
Anion Gap: 10 mEq/L (ref 3–11)
BILIRUBIN TOTAL: 0.43 mg/dL (ref 0.20–1.20)
BUN: 13.5 mg/dL (ref 7.0–26.0)
CO2: 24 mEq/L (ref 22–29)
Calcium: 9 mg/dL (ref 8.4–10.4)
Chloride: 106 mEq/L (ref 98–109)
Creatinine: 0.8 mg/dL (ref 0.6–1.1)
EGFR: 75 mL/min/{1.73_m2} — ABNORMAL LOW (ref >90)
GLUCOSE: 189 mg/dL — AB (ref 70–140)
POTASSIUM: 4.1 meq/L (ref 3.5–5.1)
SODIUM: 140 meq/L (ref 136–145)
TOTAL PROTEIN: 6.5 g/dL (ref 6.4–8.3)

## 2016-05-09 LAB — CBC WITH DIFFERENTIAL/PLATELET
BASO%: 0.7 % (ref 0.0–2.0)
Basophils Absolute: 0.1 10*3/uL (ref 0.0–0.1)
EOS%: 0.2 % (ref 0.0–7.0)
Eosinophils Absolute: 0 10*3/uL (ref 0.0–0.5)
HCT: 33.9 % — ABNORMAL LOW (ref 34.8–46.6)
HEMOGLOBIN: 11.1 g/dL — AB (ref 11.6–15.9)
LYMPH%: 8.3 % — ABNORMAL LOW (ref 14.0–49.7)
MCH: 31.9 pg (ref 25.1–34.0)
MCHC: 32.7 g/dL (ref 31.5–36.0)
MCV: 97.7 fL (ref 79.5–101.0)
MONO#: 1 10*3/uL — ABNORMAL HIGH (ref 0.1–0.9)
MONO%: 6.7 % (ref 0.0–14.0)
NEUT%: 84.1 % — ABNORMAL HIGH (ref 38.4–76.8)
NEUTROS ABS: 12.9 10*3/uL — AB (ref 1.5–6.5)
Platelets: 184 10*3/uL (ref 145–400)
RBC: 3.47 10*6/uL — ABNORMAL LOW (ref 3.70–5.45)
RDW: 15.2 % — AB (ref 11.2–14.5)
WBC: 15.3 10*3/uL — AB (ref 3.9–10.3)
lymph#: 1.3 10*3/uL (ref 0.9–3.3)

## 2016-05-09 MED ORDER — PEGFILGRASTIM 6 MG/0.6ML ~~LOC~~ PSKT
6.0000 mg | PREFILLED_SYRINGE | Freq: Once | SUBCUTANEOUS | Status: AC
  Administered 2016-05-09: 6 mg via SUBCUTANEOUS
  Filled 2016-05-09: qty 0.6

## 2016-05-09 MED ORDER — PALONOSETRON HCL INJECTION 0.25 MG/5ML
0.2500 mg | Freq: Once | INTRAVENOUS | Status: AC
  Administered 2016-05-09: 0.25 mg via INTRAVENOUS

## 2016-05-09 MED ORDER — SODIUM CHLORIDE 0.9 % IV SOLN
Freq: Once | INTRAVENOUS | Status: AC
  Administered 2016-05-09: 10:00:00 via INTRAVENOUS

## 2016-05-09 MED ORDER — SODIUM CHLORIDE 0.9% FLUSH
10.0000 mL | INTRAVENOUS | Status: DC | PRN
  Administered 2016-05-09: 10 mL via INTRAVENOUS
  Filled 2016-05-09: qty 10

## 2016-05-09 MED ORDER — SODIUM CHLORIDE 0.9% FLUSH
10.0000 mL | INTRAVENOUS | Status: DC | PRN
  Administered 2016-05-09: 10 mL
  Filled 2016-05-09: qty 10

## 2016-05-09 MED ORDER — SODIUM CHLORIDE 0.9 % IV SOLN
400.0000 mg/m2 | Freq: Once | INTRAVENOUS | Status: AC
  Administered 2016-05-09: 800 mg via INTRAVENOUS
  Filled 2016-05-09: qty 40

## 2016-05-09 MED ORDER — SODIUM CHLORIDE 0.9 % IV SOLN
Freq: Once | INTRAVENOUS | Status: AC
  Administered 2016-05-09: 10:00:00 via INTRAVENOUS
  Filled 2016-05-09: qty 5

## 2016-05-09 MED ORDER — HEPARIN SOD (PORK) LOCK FLUSH 100 UNIT/ML IV SOLN
500.0000 [IU] | Freq: Once | INTRAVENOUS | Status: AC | PRN
  Administered 2016-05-09: 500 [IU]
  Filled 2016-05-09: qty 5

## 2016-05-09 MED ORDER — DOXORUBICIN HCL CHEMO IV INJECTION 2 MG/ML
40.0000 mg/m2 | Freq: Once | INTRAVENOUS | Status: AC
  Administered 2016-05-09: 80 mg via INTRAVENOUS
  Filled 2016-05-09: qty 40

## 2016-05-09 MED ORDER — PALONOSETRON HCL INJECTION 0.25 MG/5ML
INTRAVENOUS | Status: AC
  Filled 2016-05-09: qty 5

## 2016-05-09 NOTE — Patient Instructions (Signed)
[facility] Discharge Instructions for Patients Receiving Chemotherapy  Today you received the following chemotherapy agents: Adriamycin and Cytoxan.  To help prevent nausea and vomiting after your treatment, we encourage you to take your nausea medication: Compazine. Take one every 6 hours as needed.  If you develop nausea and vomiting that is not controlled by your nausea medication, call the clinic.   BELOW ARE SYMPTOMS THAT SHOULD BE REPORTED IMMEDIATELY:  *FEVER GREATER THAN 100.5 F  *CHILLS WITH OR WITHOUT FEVER  NAUSEA AND VOMITING THAT IS NOT CONTROLLED WITH YOUR NAUSEA MEDICATION  *UNUSUAL SHORTNESS OF BREATH  *UNUSUAL BRUISING OR BLEEDING  TENDERNESS IN MOUTH AND THROAT WITH OR WITHOUT PRESENCE OF ULCERS  *URINARY PROBLEMS  *BOWEL PROBLEMS  UNUSUAL RASH Items with * indicate a potential emergency and should be followed up as soon as possible.  Feel free to call the clinic should you have any questions or concerns. The clinic phone number is [phone].  Please show the [facility] at check-in to the Emergency Department and triage nurse.

## 2016-05-09 NOTE — Progress Notes (Signed)
Patient Care Team: Estill Bamberg. [name], MD as PCP - General (Internal Medicine) Alphonsa Overall, MD as Consulting Physician (General Surgery) Nicholas Lose, MD as Consulting Physician (Hematology and Oncology) Kyung Rudd, MD as Consulting Physician (Radiation Oncology)  DIAGNOSIS:  Encounter Diagnosis  Name Primary?  . Malignant neoplasm of upper-inner quadrant of right breast in female, estrogen receptor negative ([place])     SUMMARY OF ONCOLOGIC HISTORY:   Breast cancer of upper-inner quadrant of right female breast ([place])   02/29/2016 Initial Diagnosis    Right breast palpable mass (with silicone implants 9449), 3.5 cm on MRI, additional 3 cm anterior linear enhancement? 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 Ki-67 60%, T2 N0 stage 2A clinical stage      03/14/2016 Procedure    Right breast biopsy upper inner quadrant: IDC grade 3      03/28/2016 -  Neo-Adjuvant Chemotherapy    Neoadjuvant chemotherapy with dose dense Adriamycin and Cytoxan followed by Abraxane weekly 12 ( patient is diabetic and cannot take steroids)       CHIEF COMPLIANT: Cycle 4 Adriamycin and Cytoxan  INTERVAL HISTORY: Marissa Hall is a 75 year old with above-mentioned history of right breast cancer currently on neoadjuvant chemotherapy with dose dense Adriamycin and Cytoxan. Today is cycle 4. She has had nausea fatigue and constipation related to chemotherapy. She does not have taste and this makes it very difficult to eat food.  REVIEW OF SYSTEMS:   Constitutional: Denies fevers, chills or abnormal weight loss, complains of severe fatigue Eyes: Denies blurriness of vision Ears, nose, mouth, throat, and face: Denies mucositis or sore throat Respiratory: Denies cough, dyspnea or wheezes Cardiovascular: Denies palpitation, chest discomfort Gastrointestinal:  Nausea and constipation Skin: Denies abnormal skin rashes Lymphatics: Denies new lymphadenopathy or easy bruising Neurological: Neuropathy  in the feet Behavioral/Psych: Mood is stable, no new changes  Extremities: No lower extremity edema All other systems were reviewed with the patient and are negative.  I have reviewed the past medical history, past surgical history, social history and family history with the patient and they are unchanged from previous note.  ALLERGIES:  is allergic to other and penicillins.  MEDICATIONS:  Current Outpatient Prescriptions  Medication Sig Dispense Refill  . atorvastatin (LIPITOR) 80 MG tablet Take 80 mg by mouth every evening.     [name] Kitchen azelastine (ASTELIN) 0.1 % nasal spray Place 2 sprays into both nostrils daily as needed for rhinitis or allergies. Use in each nostril as directed    . gabapentin (NEURONTIN) 100 MG capsule Take 200 mg by mouth at bedtime.    [name] Kitchen levothyroxine (SYNTHROID, LEVOTHROID) 100 MCG tablet Take 100 mcg by mouth daily before breakfast.     . lidocaine-prilocaine (EMLA) cream Apply to affected area once 30 g 3  . lisinopril (PRINIVIL,ZESTRIL) 20 MG tablet Take 20 mg by mouth every evening.     [name] Kitchen LORazepam (ATIVAN) 0.5 MG tablet Take 1 tablet (0.5 mg total) by mouth every 6 (six) hours as needed (Nausea or vomiting). 30 tablet 0  . magic mouthwash w/lidocaine SOLN 5-56m by mouth 4 times a day as needed. Swish, swallow, or spit 240 mL 1  . metFORMIN (GLUCOPHAGE-XR) 500 MG 24 hr tablet Take 1,000 mg by mouth daily with supper.     .[name] Kitchenomeprazole (PRILOSEC) 40 MG capsule Take 1 capsule (40 mg total) by mouth daily. 30 capsule 2  . prochlorperazine (COMPAZINE) 10 MG tablet Take 1 tablet (10 mg total) by mouth every 6 (six) hours  as needed (Nausea or vomiting). 30 tablet 1  . Vitamin D, Ergocalciferol, (DRISDOL) 50000 units CAPS capsule Take 50,000 Units by mouth every 7 (seven) days. Saturdays     No current facility-administered medications for this visit.     PHYSICAL EXAMINATION: ECOG PERFORMANCE STATUS: 1 - Symptomatic but completely ambulatory  Vitals:   05/09/16 0926    BP: (!) 143/60  Pulse: 67  Resp: 18  Temp: 97.8 F (36.6 C)   Filed Weights   05/09/16 0926  Weight: 180 lb (81.6 kg)    GENERAL:alert, no distress and comfortable SKIN: skin color, texture, turgor are normal, no rashes or significant lesions EYES: normal, Conjunctiva are pink and non-injected, sclera clear OROPHARYNX:no exudate, no erythema and lips, buccal mucosa, and tongue normal  NECK: supple, thyroid normal size, non-tender, without nodularity LYMPH:  no palpable lymphadenopathy in the cervical, axillary or inguinal LUNGS: clear to auscultation and percussion with normal breathing effort HEART: regular rate & rhythm and no murmurs and no lower extremity edema ABDOMEN:abdomen soft, non-tender and normal bowel sounds MUSCULOSKELETAL:no cyanosis of digits and no clubbing  NEURO: alert & oriented x 3 with fluent speech, grade 1 neuropathy in the feet EXTREMITIES: No lower extremity edema  LABORATORY DATA:  I have reviewed the data as listed   Chemistry      Component Value Date/Time   NA 139 04/25/2016 0918   K 4.1 04/25/2016 0918   CL 103 03/16/2016 0506   CO2 25 04/25/2016 0918   BUN 11.0 04/25/2016 0918   CREATININE 0.7 04/25/2016 0918      Component Value Date/Time   CALCIUM 9.6 04/25/2016 0918   ALKPHOS 133 04/25/2016 0918   AST 46 (H) 04/25/2016 0918   ALT 50 04/25/2016 0918   BILITOT 0.49 04/25/2016 0918       Lab Results  Component Value Date   WBC 15.3 (H) 05/09/2016   HGB 11.1 (L) 05/09/2016   HCT 33.9 (L) 05/09/2016   MCV 97.7 05/09/2016   PLT 184 05/09/2016   NEUTROABS 12.9 (H) 05/09/2016    ASSESSMENT & PLAN:  Breast cancer of upper-inner quadrant of right female breast ([place]) 02/29/2016: Right breast palpable mass (with silicone implants 8657), 3.5 cm on MRI, additional 3 cm anterior linear enhancement (biopsy 03/14/2016 IDC grade 3); 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  T2 N0 stage 2A clinical stage  Recommendation: 1. Neoadjuvant  chemotherapy with dose dense Adriamycin and Cytoxan 4 followed by Abraxane weekly 12 (patient is diabetic and cannot takesteroids) 2. followed by breast conserving surgery (with plastic surgery help regarding implant rupture) 3. Followed by radiation therapy ----------------------------------------------------------------------------------------------------------------------------------------- Current treatment: Cycle 4dose dense Adriamycin and Cytoxan Echocardiogram 03/25/2016: EF 60-65% Labs reviewed  Chemotherapy toxicities: 1. Grade 3 neutropenia: ANC 0 with cycle 1 day 8 : I decreased the dosage of cycle 2 of chemotherapy  2. grade 2 nausea: Instructed her to take stool softeners and proton pump inhibitors along with antinausea medications. Instructed her to take half a tablet of Compazine because it was making her drowsy. She is doing much better with nausea with this regimen. 3. Fatigue: Due to chemotherapy  4. Severe constipation: Did not respond to MiraLAX. However Senokot appeared to have worked. She is taking 4 tablets of Senokot every day. She'll increase the dosage during chemotherapy.  Closely monitoring for chemotherapy toxicities.  Diabetes mellitus: I instructed her about strict glycemic control.  Mildly elevated AST and ALT: Related to alcohol consumption. I instructed her that she cannot  drink any alcohol. The numbers are stable patient wishes to remain active and work 2 days a week.  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 We will have to be extremely cautious with her treatment. I instructed her to increase the Neurontin at bedtime 300 mg. If necessary we may add Neurontin 100 mg in the daytime. She tells me that she feels drowsy when she takes Neurontin and that's what she prefers not to take it through the day. She is still trying to work as much as she can during chemotherapy.  No orders of the defined  types were placed in this encounter.  The patient has a good understanding of the overall plan. she agrees with it. she will call with any problems that may develop before the next visit here.   Rulon Eisenmenger, MD 05/09/16

## 2016-05-16 ENCOUNTER — Other Ambulatory Visit: Payer: Medicare Other

## 2016-05-16 ENCOUNTER — Ambulatory Visit: Payer: Medicare Other | Admitting: Hematology and Oncology

## 2016-05-23 ENCOUNTER — Other Ambulatory Visit (HOSPITAL_BASED_OUTPATIENT_CLINIC_OR_DEPARTMENT_OTHER): Payer: Medicare Other

## 2016-05-23 ENCOUNTER — Ambulatory Visit (HOSPITAL_BASED_OUTPATIENT_CLINIC_OR_DEPARTMENT_OTHER): Payer: Medicare Other | Admitting: Hematology and Oncology

## 2016-05-23 ENCOUNTER — Ambulatory Visit (HOSPITAL_BASED_OUTPATIENT_CLINIC_OR_DEPARTMENT_OTHER): Payer: Medicare Other

## 2016-05-23 ENCOUNTER — Ambulatory Visit: Payer: Medicare Other

## 2016-05-23 ENCOUNTER — Encounter: Payer: Self-pay | Admitting: Hematology and Oncology

## 2016-05-23 ENCOUNTER — Encounter: Payer: Self-pay | Admitting: *Deleted

## 2016-05-23 VITALS — BP 156/77 | HR 55 | Temp 98.4°F | Resp 18

## 2016-05-23 DIAGNOSIS — Z5189 Encounter for other specified aftercare: Secondary | ICD-10-CM

## 2016-05-23 DIAGNOSIS — Z5111 Encounter for antineoplastic chemotherapy: Secondary | ICD-10-CM | POA: Diagnosis not present

## 2016-05-23 DIAGNOSIS — D701 Agranulocytosis secondary to cancer chemotherapy: Secondary | ICD-10-CM | POA: Diagnosis not present

## 2016-05-23 DIAGNOSIS — C50211 Malignant neoplasm of upper-inner quadrant of right female breast: Secondary | ICD-10-CM | POA: Diagnosis not present

## 2016-05-23 DIAGNOSIS — Z171 Estrogen receptor negative status [ER-]: Principal | ICD-10-CM

## 2016-05-23 DIAGNOSIS — E1140 Type 2 diabetes mellitus with diabetic neuropathy, unspecified: Secondary | ICD-10-CM

## 2016-05-23 DIAGNOSIS — R5383 Other fatigue: Secondary | ICD-10-CM | POA: Diagnosis not present

## 2016-05-23 DIAGNOSIS — Z95828 Presence of other vascular implants and grafts: Secondary | ICD-10-CM

## 2016-05-23 LAB — COMPREHENSIVE METABOLIC PANEL
ALK PHOS: 177 U/L — AB (ref 40–150)
ALT: 42 U/L (ref 0–55)
AST: 38 U/L — AB (ref 5–34)
Albumin: 3.7 g/dL (ref 3.5–5.0)
Anion Gap: 10 mEq/L (ref 3–11)
BUN: 12.9 mg/dL (ref 7.0–26.0)
CHLORIDE: 106 meq/L (ref 98–109)
CO2: 26 mEq/L (ref 22–29)
Calcium: 9.3 mg/dL (ref 8.4–10.4)
Creatinine: 0.8 mg/dL (ref 0.6–1.1)
EGFR: 70 mL/min/{1.73_m2} — AB (ref >90)
GLUCOSE: 166 mg/dL — AB (ref 70–140)
POTASSIUM: 4.2 meq/L (ref 3.5–5.1)
SODIUM: 142 meq/L (ref 136–145)
Total Bilirubin: 0.42 mg/dL (ref 0.20–1.20)
Total Protein: 6.8 g/dL (ref 6.4–8.3)

## 2016-05-23 LAB — CBC WITH DIFFERENTIAL/PLATELET
BASO%: 0.5 % (ref 0.0–2.0)
BASOS ABS: 0.1 10*3/uL (ref 0.0–0.1)
EOS ABS: 0 10*3/uL (ref 0.0–0.5)
EOS%: 0.2 % (ref 0.0–7.0)
HCT: 34.4 % — ABNORMAL LOW (ref 34.8–46.6)
HEMOGLOBIN: 11.3 g/dL — AB (ref 11.6–15.9)
LYMPH%: 6.7 % — AB (ref 14.0–49.7)
MCH: 32.2 pg (ref 25.1–34.0)
MCHC: 32.8 g/dL (ref 31.5–36.0)
MCV: 98.3 fL (ref 79.5–101.0)
MONO#: 1 10*3/uL — ABNORMAL HIGH (ref 0.1–0.9)
MONO%: 5.6 % (ref 0.0–14.0)
NEUT#: 15.1 10*3/uL — ABNORMAL HIGH (ref 1.5–6.5)
NEUT%: 87 % — AB (ref 38.4–76.8)
Platelets: 160 10*3/uL (ref 145–400)
RBC: 3.5 10*6/uL — AB (ref 3.70–5.45)
RDW: 15.7 % — AB (ref 11.2–14.5)
WBC: 17.4 10*3/uL — ABNORMAL HIGH (ref 3.9–10.3)
lymph#: 1.2 10*3/uL (ref 0.9–3.3)

## 2016-05-23 MED ORDER — ALTEPLASE 2 MG IJ SOLR
2.0000 mg | Freq: Once | INTRAMUSCULAR | Status: AC | PRN
  Administered 2016-05-23: 2 mg
  Filled 2016-05-23: qty 2

## 2016-05-23 MED ORDER — SODIUM CHLORIDE 0.9% FLUSH
10.0000 mL | INTRAVENOUS | Status: DC | PRN
  Administered 2016-05-23: 10 mL via INTRAVENOUS
  Filled 2016-05-23: qty 10

## 2016-05-23 MED ORDER — HEPARIN SOD (PORK) LOCK FLUSH 100 UNIT/ML IV SOLN
500.0000 [IU] | Freq: Once | INTRAVENOUS | Status: AC | PRN
  Administered 2016-05-23: 500 [IU]
  Filled 2016-05-23: qty 5

## 2016-05-23 MED ORDER — PROCHLORPERAZINE MALEATE 10 MG PO TABS
10.0000 mg | ORAL_TABLET | Freq: Once | ORAL | Status: AC
  Administered 2016-05-23: 10 mg via ORAL

## 2016-05-23 MED ORDER — PACLITAXEL PROTEIN-BOUND CHEMO INJECTION 100 MG
80.0000 mg/m2 | Freq: Once | INTRAVENOUS | Status: AC
  Administered 2016-05-23: 150 mg via INTRAVENOUS
  Filled 2016-05-23: qty 30

## 2016-05-23 MED ORDER — SODIUM CHLORIDE 0.9 % IV SOLN
Freq: Once | INTRAVENOUS | Status: AC
  Administered 2016-05-23: 12:00:00 via INTRAVENOUS

## 2016-05-23 MED ORDER — SODIUM CHLORIDE 0.9% FLUSH
10.0000 mL | INTRAVENOUS | Status: DC | PRN
  Administered 2016-05-23: 10 mL
  Filled 2016-05-23: qty 10

## 2016-05-23 MED ORDER — PROCHLORPERAZINE MALEATE 10 MG PO TABS
ORAL_TABLET | ORAL | Status: AC
  Filled 2016-05-23: qty 1

## 2016-05-23 NOTE — Patient Instructions (Signed)
[facility] Discharge Instructions for Patients Receiving Chemotherapy  Today you received the following chemotherapy agents Abraxane   To help prevent nausea and vomiting after your treatment, we encourage you to take your nausea medication as directed.    If you develop nausea and vomiting that is not controlled by your nausea medication, call the clinic.   BELOW ARE SYMPTOMS THAT SHOULD BE REPORTED IMMEDIATELY:  *FEVER GREATER THAN 100.5 F  *CHILLS WITH OR WITHOUT FEVER  NAUSEA AND VOMITING THAT IS NOT CONTROLLED WITH YOUR NAUSEA MEDICATION  *UNUSUAL SHORTNESS OF BREATH  *UNUSUAL BRUISING OR BLEEDING  TENDERNESS IN MOUTH AND THROAT WITH OR WITHOUT PRESENCE OF ULCERS  *URINARY PROBLEMS  *BOWEL PROBLEMS  UNUSUAL RASH Items with * indicate a potential emergency and should be followed up as soon as possible.  Feel free to call the clinic you have any questions or concerns. The clinic phone number is [phone].  Please show the CHEMO ALERT CARD at check-in to the Emergency Department and triage nurse.  Nanoparticle Albumin-Bound Paclitaxel injection What is this medicine? NANOPARTICLE ALBUMIN-BOUND PACLITAXEL (Na no PAHR ti kuhl al BYOO muhn-bound PAK li TAX el) is a chemotherapy drug. It targets fast dividing cells, like cancer cells, and causes these cells to die. This medicine is used to treat advanced breast cancer and advanced lung cancer. This medicine may be used for other purposes; ask your health care provider or pharmacist if you have questions. COMMON BRAND NAME(S): Abraxane What should I tell my health care provider before I take this medicine? They need to know if you have any of these conditions: -kidney disease -liver disease -low blood counts, like low platelets, red blood cells, or white blood cells -recent or ongoing radiation therapy -an unusual or allergic reaction to paclitaxel, albumin, other chemotherapy, other medicines, foods,  dyes, or preservatives -pregnant or trying to get pregnant -breast-feeding How should I use this medicine? This drug is given as an infusion into a vein. It is administered in a hospital or clinic by a specially trained health care professional. Talk to your pediatrician regarding the use of this medicine in children. Special care may be needed. Overdosage: If you think you have taken too much of this medicine contact a poison control center or emergency room at once. NOTE: This medicine is only for you. Do not share this medicine with others. What if I miss a dose? It is important not to miss your dose. Call your doctor or health care professional if you are unable to keep an appointment. What may interact with this medicine? -cyclosporine -diazepam -ketoconazole -medicines to increase blood counts like filgrastim, pegfilgrastim, sargramostim -other chemotherapy drugs like cisplatin, doxorubicin, epirubicin, etoposide, teniposide, vincristine -quinidine -testosterone -vaccines -verapamil Talk to your doctor or health care professional before taking any of these medicines: -acetaminophen -aspirin -ibuprofen -ketoprofen -naproxen This list may not describe all possible interactions. Give your health care provider a list of all the medicines, herbs, non-prescription drugs, or dietary supplements you use. Also tell them if you smoke, drink alcohol, or use illegal drugs. Some items may interact with your medicine. What should I watch for while using this medicine? Your condition will be monitored carefully while you are receiving this medicine. You will need important blood work done while you are taking this medicine. This medicine can cause serious allergic reactions. If you experience allergic reactions like skin rash, itching or hives, swelling of the face, lips, or tongue, tell your doctor or health care   professional right away. In some cases, you may be given additional medicines to  help with side effects. Follow all directions for their use. This drug may make you feel generally unwell. This is not uncommon, as chemotherapy can affect healthy cells as well as cancer cells. Report any side effects. Continue your course of treatment even though you feel ill unless your doctor tells you to stop. Call your doctor or health care professional for advice if you get a fever, chills or sore throat, or other symptoms of a cold or flu. Do not treat yourself. This drug decreases your body's ability to fight infections. Try to avoid being around people who are sick. This medicine may increase your risk to bruise or bleed. Call your doctor or health care professional if you notice any unusual bleeding. Be careful brushing and flossing your teeth or using a toothpick because you may get an infection or bleed more easily. If you have any dental work done, tell your dentist you are receiving this medicine. Avoid taking products that contain aspirin, acetaminophen, ibuprofen, naproxen, or ketoprofen unless instructed by your doctor. These medicines may hide a fever. Do not become pregnant while taking this medicine. Women should inform their doctor if they wish to become pregnant or think they might be pregnant. There is a potential for serious side effects to an unborn child. Talk to your health care professional or pharmacist for more information. Do not breast-feed an infant while taking this medicine. Men are advised not to father a child while receiving this medicine. What side effects may I notice from receiving this medicine? Side effects that you should report to your doctor or health care professional as soon as possible: -allergic reactions like skin rash, itching or hives, swelling of the face, lips, or tongue -low blood counts - This drug may decrease the number of white blood cells, red blood cells and platelets. You may be at increased risk for infections and bleeding. -signs of  infection - fever or chills, cough, sore throat, pain or difficulty passing urine -signs of decreased platelets or bleeding - bruising, pinpoint red spots on the skin, black, tarry stools, nosebleeds -signs of decreased red blood cells - unusually weak or tired, fainting spells, lightheadedness -breathing problems -changes in vision -chest pain -high or low blood pressure -mouth sores -nausea and vomiting -pain, swelling, redness or irritation at the injection site -pain, tingling, numbness in the hands or feet -slow or irregular heartbeat -swelling of the ankle, feet, hands Side effects that usually do not require medical attention (report to your doctor or health care professional if they continue or are bothersome): -aches, pains -changes in the color of fingernails -diarrhea -hair loss -loss of appetite This list may not describe all possible side effects. Call your doctor for medical advice about side effects. You may report side effects to FDA at 1-800-FDA-1088. Where should I keep my medicine? This drug is given in a hospital or clinic and will not be stored at home. NOTE: This sheet is a summary. It may not cover all possible information. If you have questions about this medicine, talk to your doctor, pharmacist, or health care provider.  2017 Elsevier/Gold Standard (2015-03-25 10:05:20)  

## 2016-05-23 NOTE — Progress Notes (Signed)
Patient Care Team: Estill Bamberg. [name], MD as PCP - General (Internal Medicine) Alphonsa Overall, MD as Consulting Physician (General Surgery) Nicholas Lose, MD as Consulting Physician (Hematology and Oncology) Kyung Rudd, MD as Consulting Physician (Radiation Oncology)  DIAGNOSIS:  Encounter Diagnosis  Name Primary?  . Malignant neoplasm of upper-inner quadrant of right breast in female, estrogen receptor negative ([place])     SUMMARY OF ONCOLOGIC HISTORY:   Breast cancer of upper-inner quadrant of right female breast ([place])   02/29/2016 Initial Diagnosis    Right breast palpable mass (with silicone implants 1443), 3.5 cm on MRI, additional 3 cm anterior linear enhancement? 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 Ki-67 60%, T2 N0 stage 2A clinical stage      03/14/2016 Procedure    Right breast biopsy upper inner quadrant: IDC grade 3      03/28/2016 -  Neo-Adjuvant Chemotherapy    Neoadjuvant chemotherapy with dose dense Adriamycin and Cytoxan followed by Abraxane weekly 12 ( patient is diabetic and cannot take steroids)      CHIEF COMPLIANT: Cycle 1 Abraxane  INTERVAL HISTORY: Marissa Hall is a 75 year old with above-mentioned history of right breast cancer currently on neoadjuvant chemotherapy. She completed 4 cycles of dose dense Adriamycin Cytoxan today is cycle 1 of Abraxane. Previously she had nausea, fatigue and constipation related to chemotherapy. She also had a port placed and alopecia. Patient things of the tumor has gotten smaller.  REVIEW OF SYSTEMS:   Constitutional: Denies fevers, chills or abnormal weight loss Eyes: Denies blurriness of vision Ears, nose, mouth, throat, and face: Denies mucositis or sore throat Respiratory: Denies cough, dyspnea or wheezes Cardiovascular: Denies palpitation, chest discomfort Gastrointestinal:  Denies nausea, heartburn or change in bowel habits Skin: Denies abnormal skin rashes Lymphatics: Denies new lymphadenopathy or  easy bruising Neurological:Denies numbness, tingling or new weaknesses Behavioral/Psych: Mood is stable, no new changes  Extremities: No lower extremity edema  All other systems were reviewed with the patient and are negative.  I have reviewed the past medical history, past surgical history, social history and family history with the patient and they are unchanged from previous note.  ALLERGIES:  is allergic to other and penicillins.  MEDICATIONS:  Current Outpatient Prescriptions  Medication Sig Dispense Refill  . atorvastatin (LIPITOR) 80 MG tablet Take 80 mg by mouth every evening.     [name] Kitchen azelastine (ASTELIN) 0.1 % nasal spray Place 2 sprays into both nostrils daily as needed for rhinitis or allergies. Use in each nostril as directed    . gabapentin (NEURONTIN) 100 MG capsule Take 200 mg by mouth at bedtime.    [name] Kitchen levothyroxine (SYNTHROID, LEVOTHROID) 100 MCG tablet Take 100 mcg by mouth daily before breakfast.     . lisinopril (PRINIVIL,ZESTRIL) 20 MG tablet Take 20 mg by mouth every evening.     . magic mouthwash w/lidocaine SOLN 5-13m by mouth 4 times a day as needed. Swish, swallow, or spit 240 mL 1  . metFORMIN (GLUCOPHAGE-XR) 500 MG 24 hr tablet Take 1,000 mg by mouth daily with supper.     .[name] Kitchenomeprazole (PRILOSEC) 40 MG capsule Take 1 capsule (40 mg total) by mouth daily. 30 capsule 2  . Vitamin D, Ergocalciferol, (DRISDOL) 50000 units CAPS capsule Take 50,000 Units by mouth every 7 (seven) days. Saturdays     No current facility-administered medications for this visit.     PHYSICAL EXAMINATION: ECOG PERFORMANCE STATUS: 1 - Symptomatic but completely ambulatory  Vitals:   05/23/16 1038  BP: (!) 150/62  Pulse: (!) 59  Resp: 18  Temp: 97.3 F (36.3 C)   Filed Weights   05/23/16 1038  Weight: 176 lb 11.2 oz (80.2 kg)    GENERAL:alert, no distress and comfortable SKIN: skin color, texture, turgor are normal, no rashes or significant lesions EYES: normal, Conjunctiva  are pink and non-injected, sclera clear OROPHARYNX:no exudate, no erythema and lips, buccal mucosa, and tongue normal  NECK: supple, thyroid normal size, non-tender, without nodularity LYMPH:  no palpable lymphadenopathy in the cervical, axillary or inguinal LUNGS: clear to auscultation and percussion with normal breathing effort HEART: regular rate & rhythm and no murmurs and no lower extremity edema ABDOMEN:abdomen soft, non-tender and normal bowel sounds MUSCULOSKELETAL:no cyanosis of digits and no clubbing  NEURO: alert & oriented x 3 with fluent speech, no focal motor/sensory deficits EXTREMITIES: No lower extremity edema BREAST: No palpable masses or nodules in either right or left breasts. No palpable axillary supraclavicular or infraclavicular adenopathy no breast tenderness or nipple discharge. (exam performed in the presence of a chaperone)  LABORATORY DATA:  I have reviewed the data as listed   Chemistry      Component Value Date/Time   NA 140 05/09/2016 0858   K 4.1 05/09/2016 0858   CL 103 03/16/2016 0506   CO2 24 05/09/2016 0858   BUN 13.5 05/09/2016 0858   CREATININE 0.8 05/09/2016 0858      Component Value Date/Time   CALCIUM 9.0 05/09/2016 0858   ALKPHOS 155 (H) 05/09/2016 0858   AST 28 05/09/2016 0858   ALT 33 05/09/2016 0858   BILITOT 0.43 05/09/2016 0858       Lab Results  Component Value Date   WBC 15.3 (H) 05/09/2016   HGB 11.1 (L) 05/09/2016   HCT 33.9 (L) 05/09/2016   MCV 97.7 05/09/2016   PLT 184 05/09/2016   NEUTROABS 12.9 (H) 05/09/2016    ASSESSMENT & PLAN:  Breast cancer of upper-inner quadrant of right female breast ([place]) 02/29/2016: Right breast palpable mass (with silicone implants 6314), 3.5 cm on MRI, additional 3 cm anterior linear enhancement (biopsy 03/14/2016 IDC grade 3); 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  T2 N0 stage 2A clinical stage  Recommendation: 1. Neoadjuvant chemotherapy with dose dense Adriamycin and Cytoxan  4 followed by Abraxane weekly 12 (patient is diabetic and cannot takesteroids) 2. followed by breast conserving surgery (with plastic surgery help regarding implant rupture) 3. Followed by radiation therapy ----------------------------------------------------------------------------------------------------------------------------------------- Current treatment: Completed 4 cycles of dose dense Adriamycin and Cytoxan, today is cycle 1 Abraxane  Echocardiogram 03/25/2016: EF 60-65% Labs reviewed  Chemotherapy toxicities: 1. Grade 3 neutropenia: ANC 0 with cycle 1 day 8 : I decreased the dosage of cycle 2 of chemotherapy  2. grade 2 nausea: Instructed her to take stool softeners and proton pump inhibitors along with antinausea medications. Instructed her to take half a tablet of Compazine because it was making her drowsy. She is doing much better with nausea with this regimen. 3. Fatigue: Due to chemotherapy  4.Severe constipation: Did not respond to MiraLAX. However Senokot appeared to have worked. She is taking 4 tablets of Senokot every day. She'll increase the dosage during chemotherapy.  Closely monitoring for chemotherapy toxicities.  Diabetes mellitus: I instructed her about strict glycemic control.  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 We will have to be extremely cautious with her treatment. I instructed her to increase the Neurontin at bedtime 300 mg. If necessary we  may add Neurontin 100 mg in the daytime. She tells me that she feels drowsy when she takes Neurontin and that's what she prefers not to take it through the day. She is still trying to work as much as she can during chemotherapy.  Mildly elevated AST and ALT: being observed Poor tissues: Getting TPA  Patient is skipping next week chemotherapy. I will see her back in 3 weeks.  No orders of the defined types were placed in this encounter.  The patient has a good  understanding of the overall plan. she agrees with it. she will call with any problems that may develop before the next visit here.   Rulon Eisenmenger, MD 05/23/16

## 2016-05-23 NOTE — Progress Notes (Signed)
Pt tolerated infusion well. Pt and VS stable at discharge.  

## 2016-05-23 NOTE — Progress Notes (Signed)
Port-A-Cath accessed. Site flushes well with no pain or swelling noted upon flushing site, slight amt of pink noted in line, no brisk blood return noted. Cath-Flo instilled per policy. Will continue to check site, Desk nurse for [facility] to inform of findings.

## 2016-05-23 NOTE — Patient Instructions (Signed)

## 2016-05-23 NOTE — Assessment & Plan Note (Signed)
02/29/2016: Right breast palpable mass (with silicone implants 0355), 3.5 cm on MRI, additional 3 cm anterior linear enhancement (biopsy 03/14/2016 IDC grade 3); 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  T2 N0 stage 2A clinical stage  Recommendation: 1. Neoadjuvant chemotherapy with dose dense Adriamycin and Cytoxan 4 followed by Abraxane weekly 12 (patient is diabetic and cannot takesteroids) 2. followed by breast conserving surgery (with plastic surgery help regarding implant rupture) 3. Followed by radiation therapy ----------------------------------------------------------------------------------------------------------------------------------------- Current treatment: Completed 4 cycles of dose dense Adriamycin and Cytoxan, today is cycle 1 Abraxane Echocardiogram 03/25/2016: EF 60-65% Labs reviewed  Chemotherapy toxicities: 1. Grade 3 neutropenia: ANC 0 with cycle 1 day 8 : I decreased the dosage of cycle 2 of chemotherapy  2. grade 2 nausea: Instructed her to take stool softeners and proton pump inhibitors along with antinausea medications. Instructed her to take half a tablet of Compazine because it was making her drowsy. She is doing much better with nausea with this regimen. 3. Fatigue: Due to chemotherapy  4.Severe constipation: Did not respond to MiraLAX. However Senokot appeared to have worked. She is taking 4 tablets of Senokot every day. She'll increase the dosage during chemotherapy.  Closely monitoring for chemotherapy toxicities.  Diabetes mellitus: I instructed her about strict glycemic control.  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 We will have to be extremely cautious with her treatment. I instructed her to increase the Neurontin at bedtime 300 mg. If necessary we may add Neurontin 100 mg in the daytime. She tells me that she feels drowsy when she takes Neurontin and that's what she prefers not to take it through the day.  She is still trying to work as much as she can during chemotherapy.  Mildly elevated AST and ALT: Related to alcohol consumption, being observed

## 2016-05-31 ENCOUNTER — Ambulatory Visit: Payer: Medicare Other

## 2016-05-31 ENCOUNTER — Other Ambulatory Visit: Payer: Medicare Other

## 2016-06-06 NOTE — Assessment & Plan Note (Signed)
02/29/2016: Right breast palpable mass (with silicone implants 5732), 3.5 cm on MRI, additional 3 cm anterior linear enhancement (biopsy 03/14/2016 IDC grade 3); 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  T2 N0 stage 2A clinical stage  Recommendation: 1. Neoadjuvant chemotherapy with dose dense Adriamycin and Cytoxan 4 followed by Abraxane weekly 12 (patient is diabetic and cannot takesteroids) 2. followed by breast conserving surgery (with plastic surgery help regarding implant rupture) 3. Followed by radiation therapy ----------------------------------------------------------------------------------------------------------------------------------------- Current treatment: Completed 4 cycles of dose dense Adriamycin and Cytoxan, today is cycle 2 Abraxane  Echocardiogram 03/25/2016: EF 60-65% Labs reviewed  Chemotherapy toxicities: 1. Grade 3 neutropenia: with AC 2. grade 2 nausea:Improved 3. Fatigue: Due to chemotherapy  4.Severe constipation: Did not respond to MiraLAX. However Senokot appeared to have worked. She is taking 4 tablets of Senokot every day. She'll increase the dosage during chemotherapy.  Closely monitoring for chemotherapy toxicities.  Diabetes mellitus: I instructed her about strict glycemic control.  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 Now on Neurontin at bedtime 300 mg. If necessary we may add Neurontin 100 mg in the daytime. She tells me that she feels drowsy when she takes Neurontin and that's what she prefers not to take it through the day. She is still trying to work as much as she can during chemotherapy.  Mildly elevated AST and ALT: being observed  RTC in 2 weeks and weekly for chemo.

## 2016-06-07 ENCOUNTER — Other Ambulatory Visit (HOSPITAL_BASED_OUTPATIENT_CLINIC_OR_DEPARTMENT_OTHER): Payer: Medicare Other

## 2016-06-07 ENCOUNTER — Ambulatory Visit: Payer: Medicare Other

## 2016-06-07 ENCOUNTER — Encounter: Payer: Self-pay | Admitting: Hematology and Oncology

## 2016-06-07 ENCOUNTER — Ambulatory Visit (HOSPITAL_BASED_OUTPATIENT_CLINIC_OR_DEPARTMENT_OTHER): Payer: Medicare Other | Admitting: Hematology and Oncology

## 2016-06-07 ENCOUNTER — Ambulatory Visit (HOSPITAL_BASED_OUTPATIENT_CLINIC_OR_DEPARTMENT_OTHER): Payer: Medicare Other

## 2016-06-07 DIAGNOSIS — C50211 Malignant neoplasm of upper-inner quadrant of right female breast: Secondary | ICD-10-CM

## 2016-06-07 DIAGNOSIS — Z171 Estrogen receptor negative status [ER-]: Secondary | ICD-10-CM | POA: Diagnosis not present

## 2016-06-07 DIAGNOSIS — G620 Drug-induced polyneuropathy: Secondary | ICD-10-CM | POA: Diagnosis not present

## 2016-06-07 DIAGNOSIS — E119 Type 2 diabetes mellitus without complications: Secondary | ICD-10-CM | POA: Diagnosis not present

## 2016-06-07 DIAGNOSIS — R5383 Other fatigue: Secondary | ICD-10-CM

## 2016-06-07 DIAGNOSIS — Z5111 Encounter for antineoplastic chemotherapy: Secondary | ICD-10-CM | POA: Diagnosis present

## 2016-06-07 DIAGNOSIS — D701 Agranulocytosis secondary to cancer chemotherapy: Secondary | ICD-10-CM

## 2016-06-07 DIAGNOSIS — Z95828 Presence of other vascular implants and grafts: Secondary | ICD-10-CM

## 2016-06-07 LAB — CBC WITH DIFFERENTIAL/PLATELET
BASO%: 2.8 % — ABNORMAL HIGH (ref 0.0–2.0)
Basophils Absolute: 0.1 10*3/uL (ref 0.0–0.1)
EOS%: 1.3 % (ref 0.0–7.0)
Eosinophils Absolute: 0.1 10*3/uL (ref 0.0–0.5)
HCT: 32.2 % — ABNORMAL LOW (ref 34.8–46.6)
HGB: 10.7 g/dL — ABNORMAL LOW (ref 11.6–15.9)
LYMPH%: 17 % (ref 14.0–49.7)
MCH: 32.4 pg (ref 25.1–34.0)
MCHC: 33.2 g/dL (ref 31.5–36.0)
MCV: 97.6 fL (ref 79.5–101.0)
MONO#: 0.5 10*3/uL (ref 0.1–0.9)
MONO%: 13.1 % (ref 0.0–14.0)
NEUT#: 2.6 10*3/uL (ref 1.5–6.5)
NEUT%: 65.8 % (ref 38.4–76.8)
Platelets: 174 10*3/uL (ref 145–400)
RBC: 3.3 10*6/uL — ABNORMAL LOW (ref 3.70–5.45)
RDW: 14.9 % — ABNORMAL HIGH (ref 11.2–14.5)
WBC: 3.9 10*3/uL (ref 3.9–10.3)
lymph#: 0.7 10*3/uL — ABNORMAL LOW (ref 0.9–3.3)
nRBC: 0 % (ref 0–0)

## 2016-06-07 LAB — COMPREHENSIVE METABOLIC PANEL
ALT: 45 U/L (ref 0–55)
AST: 42 U/L — ABNORMAL HIGH (ref 5–34)
Albumin: 3.8 g/dL (ref 3.5–5.0)
Alkaline Phosphatase: 100 U/L (ref 40–150)
Anion Gap: 9 mEq/L (ref 3–11)
BUN: 13.5 mg/dL (ref 7.0–26.0)
CO2: 22 mEq/L (ref 22–29)
Calcium: 9.2 mg/dL (ref 8.4–10.4)
Chloride: 107 mEq/L (ref 98–109)
Creatinine: 0.8 mg/dL (ref 0.6–1.1)
EGFR: 75 mL/min/{1.73_m2} — ABNORMAL LOW (ref >90)
Glucose: 126 mg/dl (ref 70–140)
Potassium: 4.2 mEq/L (ref 3.5–5.1)
Sodium: 138 mEq/L (ref 136–145)
Total Bilirubin: 0.84 mg/dL (ref 0.20–1.20)
Total Protein: 6.6 g/dL (ref 6.4–8.3)

## 2016-06-07 MED ORDER — PROCHLORPERAZINE MALEATE 10 MG PO TABS
ORAL_TABLET | ORAL | Status: AC
  Filled 2016-06-07: qty 1

## 2016-06-07 MED ORDER — SODIUM CHLORIDE 0.9% FLUSH
10.0000 mL | INTRAVENOUS | Status: DC | PRN
  Administered 2016-06-07: 10 mL via INTRAVENOUS
  Filled 2016-06-07: qty 10

## 2016-06-07 MED ORDER — SODIUM CHLORIDE 0.9% FLUSH
10.0000 mL | INTRAVENOUS | Status: DC | PRN
  Administered 2016-06-07: 10 mL
  Filled 2016-06-07: qty 10

## 2016-06-07 MED ORDER — PACLITAXEL PROTEIN-BOUND CHEMO INJECTION 100 MG
80.0000 mg/m2 | Freq: Once | INTRAVENOUS | Status: AC
  Administered 2016-06-07: 150 mg via INTRAVENOUS
  Filled 2016-06-07: qty 30

## 2016-06-07 MED ORDER — PROCHLORPERAZINE MALEATE 10 MG PO TABS
10.0000 mg | ORAL_TABLET | Freq: Once | ORAL | Status: AC
  Administered 2016-06-07: 10 mg via ORAL

## 2016-06-07 MED ORDER — HEPARIN SOD (PORK) LOCK FLUSH 100 UNIT/ML IV SOLN
500.0000 [IU] | Freq: Once | INTRAVENOUS | Status: AC | PRN
  Administered 2016-06-07: 500 [IU]
  Filled 2016-06-07: qty 5

## 2016-06-07 MED ORDER — SODIUM CHLORIDE 0.9 % IV SOLN
Freq: Once | INTRAVENOUS | Status: AC
  Administered 2016-06-07: 11:00:00 via INTRAVENOUS

## 2016-06-07 NOTE — Patient Instructions (Signed)
[facility] Discharge Instructions for Patients Receiving Chemotherapy  Today you received the following chemotherapy agents Abraxane   To help prevent nausea and vomiting after your treatment, we encourage you to take your nausea medication as directed.    If you develop nausea and vomiting that is not controlled by your nausea medication, call the clinic.   BELOW ARE SYMPTOMS THAT SHOULD BE REPORTED IMMEDIATELY:  *FEVER GREATER THAN 100.5 F  *CHILLS WITH OR WITHOUT FEVER  NAUSEA AND VOMITING THAT IS NOT CONTROLLED WITH YOUR NAUSEA MEDICATION  *UNUSUAL SHORTNESS OF BREATH  *UNUSUAL BRUISING OR BLEEDING  TENDERNESS IN MOUTH AND THROAT WITH OR WITHOUT PRESENCE OF ULCERS  *URINARY PROBLEMS  *BOWEL PROBLEMS  UNUSUAL RASH Items with * indicate a potential emergency and should be followed up as soon as possible.  Feel free to call the clinic you have any questions or concerns. The clinic phone number is [phone].  Please show the CHEMO ALERT CARD at check-in to the Emergency Department and triage nurse.  Nanoparticle Albumin-Bound Paclitaxel injection What is this medicine? NANOPARTICLE ALBUMIN-BOUND PACLITAXEL (Na no PAHR ti kuhl al BYOO muhn-bound PAK li TAX el) is a chemotherapy drug. It targets fast dividing cells, like cancer cells, and causes these cells to die. This medicine is used to treat advanced breast cancer and advanced lung cancer. This medicine may be used for other purposes; ask your health care provider or pharmacist if you have questions. COMMON BRAND NAME(S): Abraxane What should I tell my health care provider before I take this medicine? They need to know if you have any of these conditions: -kidney disease -liver disease -low blood counts, like low platelets, red blood cells, or white blood cells -recent or ongoing radiation therapy -an unusual or allergic reaction to paclitaxel, albumin, other chemotherapy, other medicines, foods,  dyes, or preservatives -pregnant or trying to get pregnant -breast-feeding How should I use this medicine? This drug is given as an infusion into a vein. It is administered in a hospital or clinic by a specially trained health care professional. Talk to your pediatrician regarding the use of this medicine in children. Special care may be needed. Overdosage: If you think you have taken too much of this medicine contact a poison control center or emergency room at once. NOTE: This medicine is only for you. Do not share this medicine with others. What if I miss a dose? It is important not to miss your dose. Call your doctor or health care professional if you are unable to keep an appointment. What may interact with this medicine? -cyclosporine -diazepam -ketoconazole -medicines to increase blood counts like filgrastim, pegfilgrastim, sargramostim -other chemotherapy drugs like cisplatin, doxorubicin, epirubicin, etoposide, teniposide, vincristine -quinidine -testosterone -vaccines -verapamil Talk to your doctor or health care professional before taking any of these medicines: -acetaminophen -aspirin -ibuprofen -ketoprofen -naproxen This list may not describe all possible interactions. Give your health care provider a list of all the medicines, herbs, non-prescription drugs, or dietary supplements you use. Also tell them if you smoke, drink alcohol, or use illegal drugs. Some items may interact with your medicine. What should I watch for while using this medicine? Your condition will be monitored carefully while you are receiving this medicine. You will need important blood work done while you are taking this medicine. This medicine can cause serious allergic reactions. If you experience allergic reactions like skin rash, itching or hives, swelling of the face, lips, or tongue, tell your doctor or health care   professional right away. In some cases, you may be given additional medicines to  help with side effects. Follow all directions for their use. This drug may make you feel generally unwell. This is not uncommon, as chemotherapy can affect healthy cells as well as cancer cells. Report any side effects. Continue your course of treatment even though you feel ill unless your doctor tells you to stop. Call your doctor or health care professional for advice if you get a fever, chills or sore throat, or other symptoms of a cold or flu. Do not treat yourself. This drug decreases your body's ability to fight infections. Try to avoid being around people who are sick. This medicine may increase your risk to bruise or bleed. Call your doctor or health care professional if you notice any unusual bleeding. Be careful brushing and flossing your teeth or using a toothpick because you may get an infection or bleed more easily. If you have any dental work done, tell your dentist you are receiving this medicine. Avoid taking products that contain aspirin, acetaminophen, ibuprofen, naproxen, or ketoprofen unless instructed by your doctor. These medicines may hide a fever. Do not become pregnant while taking this medicine. Women should inform their doctor if they wish to become pregnant or think they might be pregnant. There is a potential for serious side effects to an unborn child. Talk to your health care professional or pharmacist for more information. Do not breast-feed an infant while taking this medicine. Men are advised not to father a child while receiving this medicine. What side effects may I notice from receiving this medicine? Side effects that you should report to your doctor or health care professional as soon as possible: -allergic reactions like skin rash, itching or hives, swelling of the face, lips, or tongue -low blood counts - This drug may decrease the number of white blood cells, red blood cells and platelets. You may be at increased risk for infections and bleeding. -signs of  infection - fever or chills, cough, sore throat, pain or difficulty passing urine -signs of decreased platelets or bleeding - bruising, pinpoint red spots on the skin, black, tarry stools, nosebleeds -signs of decreased red blood cells - unusually weak or tired, fainting spells, lightheadedness -breathing problems -changes in vision -chest pain -high or low blood pressure -mouth sores -nausea and vomiting -pain, swelling, redness or irritation at the injection site -pain, tingling, numbness in the hands or feet -slow or irregular heartbeat -swelling of the ankle, feet, hands Side effects that usually do not require medical attention (report to your doctor or health care professional if they continue or are bothersome): -aches, pains -changes in the color of fingernails -diarrhea -hair loss -loss of appetite This list may not describe all possible side effects. Call your doctor for medical advice about side effects. You may report side effects to FDA at 1-800-FDA-1088. Where should I keep my medicine? This drug is given in a hospital or clinic and will not be stored at home. NOTE: This sheet is a summary. It may not cover all possible information. If you have questions about this medicine, talk to your doctor, pharmacist, or health care provider.  2017 [employer]vier/Gold Standard (2015-03-25 10:05:20)  

## 2016-06-07 NOTE — Progress Notes (Signed)
Patient Care Team: Marissa Hall. [name], MD as PCP - General (Internal Medicine) Alphonsa Overall, MD as Consulting Physician (General Surgery) Nicholas Lose, MD as Consulting Physician (Hematology and Oncology) Kyung Rudd, MD as Consulting Physician (Radiation Oncology)  DIAGNOSIS:  Encounter Diagnosis  Name Primary?  . Malignant neoplasm of upper-inner quadrant of right breast in female, estrogen receptor negative ([place])     SUMMARY OF ONCOLOGIC HISTORY:   Breast cancer of upper-inner quadrant of right female breast ([place])   02/29/2016 Initial Diagnosis    Right breast palpable mass (with silicone implants 1157), 3.5 cm on MRI, additional 3 cm anterior linear enhancement? 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 Ki-67 60%, T2 N0 stage 2A clinical stage      03/14/2016 Procedure    Right breast biopsy upper inner quadrant: IDC grade 3      03/28/2016 -  Neo-Adjuvant Chemotherapy    Neoadjuvant chemotherapy with dose dense Adriamycin and Cytoxan followed by Abraxane weekly 12 ( patient is diabetic and cannot take steroids)       CHIEF COMPLIANT: Cycle 2 Abraxane  INTERVAL HISTORY: Marissa Hall is a 76 year old with above-mentioned history of right breast cancer currently on neoadjuvant chemotherapy. Today is cycle 4 Robaxin. She tolerated the cycle extremely well. Third day after Abraxane she felt fatigued. Other than that she is able to recover very well. Her taste and appetite are coming back. Neuropathy stable.  REVIEW OF SYSTEMS:   Constitutional: Denies fevers, chills or abnormal weight loss Eyes: Denies blurriness of vision Ears, nose, mouth, throat, and face: Denies mucositis or sore throat Respiratory: Denies cough, dyspnea or wheezes Cardiovascular: Denies palpitation, chest discomfort Gastrointestinal:  Denies nausea, heartburn or change in bowel habits Skin: Denies abnormal skin rashes Lymphatics: Denies new lymphadenopathy or easy  bruising Neurological:Denies numbness, tingling or new weaknesses Behavioral/Psych: Mood is stable, no new changes  Extremities: No lower extremity edema All other systems were reviewed with the patient and are negative.  I have reviewed the past medical history, past surgical history, social history and family history with the patient and they are unchanged from previous note.  ALLERGIES:  is allergic to other and penicillins.  MEDICATIONS:  Current Outpatient Prescriptions  Medication Sig Dispense Refill  . atorvastatin (LIPITOR) 80 MG tablet Take 80 mg by mouth every evening.     [name] Kitchen azelastine (ASTELIN) 0.1 % nasal spray Place 2 sprays into both nostrils daily as needed for rhinitis or allergies. Use in each nostril as directed    . gabapentin (NEURONTIN) 100 MG capsule Take 200 mg by mouth at bedtime.    [name] Kitchen levothyroxine (SYNTHROID, LEVOTHROID) 100 MCG tablet Take 100 mcg by mouth daily before breakfast.     . lisinopril (PRINIVIL,ZESTRIL) 20 MG tablet Take 20 mg by mouth every evening.     . magic mouthwash w/lidocaine SOLN 5-4m by mouth 4 times a day as needed. Swish, swallow, or spit 240 mL 1  . metFORMIN (GLUCOPHAGE-XR) 500 MG 24 hr tablet Take 1,000 mg by mouth daily with supper.     .[name] Kitchenomeprazole (PRILOSEC) 40 MG capsule Take 1 capsule (40 mg total) by mouth daily. 30 capsule 2  . Vitamin D, Ergocalciferol, (DRISDOL) 50000 units CAPS capsule Take 50,000 Units by mouth every 7 (seven) days. Saturdays     No current facility-administered medications for this visit.     PHYSICAL EXAMINATION: ECOG PERFORMANCE STATUS: 1 - Symptomatic but completely ambulatory  Vitals:   06/07/16 1017  BP: (!) 146/78  Pulse: (!) 57  Resp: 18  Temp: 98.4 F (36.9 C)   Filed Weights   06/07/16 1017  Weight: 173 lb 9.6 oz (78.7 kg)    GENERAL:alert, no distress and comfortable SKIN: skin color, texture, turgor are normal, no rashes or significant lesions EYES: normal, Conjunctiva are pink  and non-injected, sclera clear OROPHARYNX:no exudate, no erythema and lips, buccal mucosa, and tongue normal  NECK: supple, thyroid normal size, non-tender, without nodularity LYMPH:  no palpable lymphadenopathy in the cervical, axillary or inguinal LUNGS: clear to auscultation and percussion with normal breathing effort HEART: regular rate & rhythm and no murmurs and no lower extremity edema ABDOMEN:abdomen soft, non-tender and normal bowel sounds MUSCULOSKELETAL:no cyanosis of digits and no clubbing  NEURO: alert & oriented x 3 with fluent speech, no focal motor/sensory deficits EXTREMITIES: No lower extremity edema  LABORATORY DATA:  I have reviewed the data as listed   Chemistry      Component Value Date/Time   NA 142 05/23/2016 0936   K 4.2 05/23/2016 0936   CL 103 03/16/2016 0506   CO2 26 05/23/2016 0936   BUN 12.9 05/23/2016 0936   CREATININE 0.8 05/23/2016 0936      Component Value Date/Time   CALCIUM 9.3 05/23/2016 0936   ALKPHOS 177 (H) 05/23/2016 0936   AST 38 (H) 05/23/2016 0936   ALT 42 05/23/2016 0936   BILITOT 0.42 05/23/2016 0936       Lab Results  Component Value Date   WBC 3.9 06/07/2016   HGB 10.7 (L) 06/07/2016   HCT 32.2 (L) 06/07/2016   MCV 97.6 06/07/2016   PLT 174 06/07/2016   NEUTROABS 2.6 06/07/2016    ASSESSMENT & PLAN:  Breast cancer of upper-inner quadrant of right female breast ([place]) 02/29/2016: Right breast palpable mass (with silicone implants 1937), 3.5 cm on MRI, additional 3 cm anterior linear enhancement (biopsy 03/14/2016 IDC grade 3); 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  T2 N0 stage 2A clinical stage  Recommendation: 1. Neoadjuvant chemotherapy with dose dense Adriamycin and Cytoxan 4 followed by Abraxane weekly 12 (patient is diabetic and cannot takesteroids) 2. followed by breast conserving surgery (with plastic surgery help regarding implant rupture) 3. Followed by radiation  therapy ----------------------------------------------------------------------------------------------------------------------------------------- Current treatment: Completed 4 cycles of dose dense Adriamycin and Cytoxan, today is cycle 2 Abraxane  Echocardiogram 03/25/2016: EF 60-65% Labs reviewed  Chemotherapy toxicities: 1. Grade 3 neutropenia: with AC 2. grade 2 nausea:Improved 3. Fatigue: Due to chemotherapy  4.Severe constipation: Did not respond to MiraLAX. However Senokot appeared to have worked. She is taking 4 tablets of Senokot every day. She'll increase the dosage during chemotherapy.  Closely monitoring for chemotherapy toxicities.  Diabetes mellitus: I instructed her about strict glycemic control.  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 Now on Neurontin at bedtime 300 mg. If necessary we may add Neurontin 100 mg in the daytime. She tells me that she feels drowsy when she takes Neurontin and that's what she prefers not to take it through the day. She is still trying to work as much as she can during chemotherapy.  Mildly elevated AST and ALT: being observed  RTC in 2 weeks and weekly for chemo.   No orders of the defined types were placed in this encounter.  The patient has a good understanding of the overall plan. she agrees with it. she will call with any problems that may develop before the next visit here.   Rulon Eisenmenger,  MD 06/07/16

## 2016-06-13 ENCOUNTER — Encounter: Payer: Self-pay | Admitting: *Deleted

## 2016-06-13 ENCOUNTER — Ambulatory Visit (HOSPITAL_BASED_OUTPATIENT_CLINIC_OR_DEPARTMENT_OTHER): Payer: Medicare Other

## 2016-06-13 ENCOUNTER — Other Ambulatory Visit (HOSPITAL_BASED_OUTPATIENT_CLINIC_OR_DEPARTMENT_OTHER): Payer: Medicare Other

## 2016-06-13 ENCOUNTER — Ambulatory Visit: Payer: Medicare Other

## 2016-06-13 ENCOUNTER — Other Ambulatory Visit: Payer: Medicare Other

## 2016-06-13 VITALS — BP 172/65 | HR 65 | Temp 98.0°F | Resp 17

## 2016-06-13 DIAGNOSIS — Z95828 Presence of other vascular implants and grafts: Secondary | ICD-10-CM

## 2016-06-13 DIAGNOSIS — Z5111 Encounter for antineoplastic chemotherapy: Secondary | ICD-10-CM

## 2016-06-13 DIAGNOSIS — C50211 Malignant neoplasm of upper-inner quadrant of right female breast: Secondary | ICD-10-CM

## 2016-06-13 DIAGNOSIS — Z171 Estrogen receptor negative status [ER-]: Principal | ICD-10-CM

## 2016-06-13 LAB — CBC WITH DIFFERENTIAL/PLATELET
BASO%: 3.2 % — AB (ref 0.0–2.0)
Basophils Absolute: 0.1 10*3/uL (ref 0.0–0.1)
EOS%: 2.3 % (ref 0.0–7.0)
Eosinophils Absolute: 0.1 10*3/uL (ref 0.0–0.5)
HEMATOCRIT: 32.8 % — AB (ref 34.8–46.6)
HEMOGLOBIN: 11 g/dL — AB (ref 11.6–15.9)
LYMPH%: 21.3 % (ref 14.0–49.7)
MCH: 32.7 pg (ref 25.1–34.0)
MCHC: 33.5 g/dL (ref 31.5–36.0)
MCV: 97.7 fL (ref 79.5–101.0)
MONO#: 0.2 10*3/uL (ref 0.1–0.9)
MONO%: 5.9 % (ref 0.0–14.0)
NEUT%: 67.3 % (ref 38.4–76.8)
NEUTROS ABS: 2 10*3/uL (ref 1.5–6.5)
Platelets: 164 10*3/uL (ref 145–400)
RBC: 3.36 10*6/uL — ABNORMAL LOW (ref 3.70–5.45)
RDW: 15.4 % — AB (ref 11.2–14.5)
WBC: 3 10*3/uL — AB (ref 3.9–10.3)
lymph#: 0.6 10*3/uL — ABNORMAL LOW (ref 0.9–3.3)

## 2016-06-13 LAB — COMPREHENSIVE METABOLIC PANEL
ALT: 42 U/L (ref 0–55)
AST: 44 U/L — AB (ref 5–34)
Albumin: 4 g/dL (ref 3.5–5.0)
Alkaline Phosphatase: 104 U/L (ref 40–150)
Anion Gap: 9 mEq/L (ref 3–11)
BILIRUBIN TOTAL: 1.43 mg/dL — AB (ref 0.20–1.20)
BUN: 14.8 mg/dL (ref 7.0–26.0)
CALCIUM: 9.4 mg/dL (ref 8.4–10.4)
CO2: 24 mEq/L (ref 22–29)
CREATININE: 0.8 mg/dL (ref 0.6–1.1)
Chloride: 105 mEq/L (ref 98–109)
EGFR: 72 mL/min/{1.73_m2} — ABNORMAL LOW (ref >90)
Glucose: 163 mg/dl — ABNORMAL HIGH (ref 70–140)
Potassium: 4.2 mEq/L (ref 3.5–5.1)
Sodium: 138 mEq/L (ref 136–145)
TOTAL PROTEIN: 6.8 g/dL (ref 6.4–8.3)

## 2016-06-13 MED ORDER — SODIUM CHLORIDE 0.9 % IV SOLN
Freq: Once | INTRAVENOUS | Status: AC
  Administered 2016-06-13: 12:00:00 via INTRAVENOUS

## 2016-06-13 MED ORDER — PROCHLORPERAZINE MALEATE 10 MG PO TABS
ORAL_TABLET | ORAL | Status: AC
  Filled 2016-06-13: qty 1

## 2016-06-13 MED ORDER — SODIUM CHLORIDE 0.9% FLUSH
10.0000 mL | INTRAVENOUS | Status: DC | PRN
  Administered 2016-06-13: 10 mL
  Filled 2016-06-13: qty 10

## 2016-06-13 MED ORDER — PROCHLORPERAZINE MALEATE 10 MG PO TABS
10.0000 mg | ORAL_TABLET | Freq: Once | ORAL | Status: AC
  Administered 2016-06-13: 10 mg via ORAL

## 2016-06-13 MED ORDER — PACLITAXEL PROTEIN-BOUND CHEMO INJECTION 100 MG
80.0000 mg/m2 | Freq: Once | INTRAVENOUS | Status: AC
  Administered 2016-06-13: 150 mg via INTRAVENOUS
  Filled 2016-06-13: qty 30

## 2016-06-13 MED ORDER — HEPARIN SOD (PORK) LOCK FLUSH 100 UNIT/ML IV SOLN
500.0000 [IU] | Freq: Once | INTRAVENOUS | Status: AC | PRN
  Administered 2016-06-13: 500 [IU]
  Filled 2016-06-13: qty 5

## 2016-06-13 MED ORDER — SODIUM CHLORIDE 0.9% FLUSH
10.0000 mL | INTRAVENOUS | Status: DC | PRN
  Administered 2016-06-13: 10 mL via INTRAVENOUS
  Filled 2016-06-13: qty 10

## 2016-06-13 NOTE — Patient Instructions (Signed)
[facility] Discharge Instructions for Patients Receiving Chemotherapy  Today you received the following chemotherapy agents Abraxane To help prevent nausea and vomiting after your treatment, we encourage you to take your nausea medication as prescribed.   If you develop nausea and vomiting that is not controlled by your nausea medication, call the clinic.   BELOW ARE SYMPTOMS THAT SHOULD BE REPORTED IMMEDIATELY:  *FEVER GREATER THAN 100.5 F  *CHILLS WITH OR WITHOUT FEVER  NAUSEA AND VOMITING THAT IS NOT CONTROLLED WITH YOUR NAUSEA MEDICATION  *UNUSUAL SHORTNESS OF BREATH  *UNUSUAL BRUISING OR BLEEDING  TENDERNESS IN MOUTH AND THROAT WITH OR WITHOUT PRESENCE OF ULCERS  *URINARY PROBLEMS  *BOWEL PROBLEMS  UNUSUAL RASH Items with * indicate a potential emergency and should be followed up as soon as possible.  Feel free to call the clinic you have any questions or concerns. The clinic phone number is [phone].  Please show the CHEMO ALERT CARD at check-in to the Emergency Department and triage nurse.   

## 2016-06-20 ENCOUNTER — Other Ambulatory Visit: Payer: Self-pay

## 2016-06-20 DIAGNOSIS — C50211 Malignant neoplasm of upper-inner quadrant of right female breast: Secondary | ICD-10-CM

## 2016-06-20 DIAGNOSIS — Z171 Estrogen receptor negative status [ER-]: Principal | ICD-10-CM

## 2016-06-20 NOTE — Assessment & Plan Note (Signed)
02/29/2016: Right breast palpable mass (with silicone implants 4799), 3.5 cm on MRI, additional 3 cm anterior linear enhancement (biopsy 03/14/2016 IDC grade 3); 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  T2 N0 stage 2A clinical stage  Recommendation: 1. Neoadjuvant chemotherapy with dose dense Adriamycin and Cytoxan 4 followed by Abraxane weekly 12 (patient is diabetic and cannot takesteroids) 2. followed by breast conserving surgery (with plastic surgery help regarding implant rupture) 3. Followed by radiation therapy ----------------------------------------------------------------------------------------------------------------------------------------- Current treatment: Completed 4 cycles of dose dense Adriamycin and Cytoxan, today is cycle 4 Abraxane  Echocardiogram 03/25/2016: EF 60-65% Labs reviewed  Chemotherapy toxicities: 1. Grade 3 neutropenia: with AC 2. grade 2 nausea:Improved 3. Fatigue: Due to chemotherapy  4.Severe constipation: Did not respond to MiraLAX. However Senokot appeared to have worked. She is taking 4 tablets of Senokot every day. She'll increase the dosage during chemotherapy.  Closely monitoring for chemotherapy toxicities.  Diabetes mellitus: I instructed her about strict glycemic control.  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 Now on Neurontin at bedtime 300 mg. If necessary we may add Neurontin 100 mg in the daytime. She tells me that she feels drowsy when she takes Neurontin and that's what she prefers not to take it through the day. She is still trying to work as much as she can during chemotherapy.  Mildly elevated AST and ALT: being observed  RTC in 2 weeks and weekly for chemo.

## 2016-06-21 ENCOUNTER — Encounter: Payer: Self-pay | Admitting: *Deleted

## 2016-06-21 ENCOUNTER — Other Ambulatory Visit (HOSPITAL_BASED_OUTPATIENT_CLINIC_OR_DEPARTMENT_OTHER): Payer: Medicare Other

## 2016-06-21 ENCOUNTER — Ambulatory Visit: Payer: Medicare Other

## 2016-06-21 ENCOUNTER — Ambulatory Visit (HOSPITAL_BASED_OUTPATIENT_CLINIC_OR_DEPARTMENT_OTHER): Payer: Medicare Other | Admitting: Hematology and Oncology

## 2016-06-21 ENCOUNTER — Ambulatory Visit (HOSPITAL_BASED_OUTPATIENT_CLINIC_OR_DEPARTMENT_OTHER): Payer: Medicare Other

## 2016-06-21 VITALS — BP 172/72 | HR 58

## 2016-06-21 DIAGNOSIS — D701 Agranulocytosis secondary to cancer chemotherapy: Secondary | ICD-10-CM | POA: Diagnosis not present

## 2016-06-21 DIAGNOSIS — Z171 Estrogen receptor negative status [ER-]: Secondary | ICD-10-CM

## 2016-06-21 DIAGNOSIS — R530 Neoplastic (malignant) related fatigue: Secondary | ICD-10-CM

## 2016-06-21 DIAGNOSIS — R748 Abnormal levels of other serum enzymes: Secondary | ICD-10-CM

## 2016-06-21 DIAGNOSIS — Z5111 Encounter for antineoplastic chemotherapy: Secondary | ICD-10-CM

## 2016-06-21 DIAGNOSIS — C50211 Malignant neoplasm of upper-inner quadrant of right female breast: Secondary | ICD-10-CM

## 2016-06-21 DIAGNOSIS — E1140 Type 2 diabetes mellitus with diabetic neuropathy, unspecified: Secondary | ICD-10-CM

## 2016-06-21 LAB — COMPREHENSIVE METABOLIC PANEL
ALBUMIN: 3.8 g/dL (ref 3.5–5.0)
ALT: 38 U/L (ref 0–55)
AST: 50 U/L — AB (ref 5–34)
Alkaline Phosphatase: 96 U/L (ref 40–150)
Anion Gap: 10 mEq/L (ref 3–11)
BUN: 9.8 mg/dL (ref 7.0–26.0)
CALCIUM: 9.6 mg/dL (ref 8.4–10.4)
CHLORIDE: 104 meq/L (ref 98–109)
CO2: 25 mEq/L (ref 22–29)
CREATININE: 0.9 mg/dL (ref 0.6–1.1)
EGFR: 62 mL/min/{1.73_m2} — ABNORMAL LOW (ref >90)
GLUCOSE: 203 mg/dL — AB (ref 70–140)
Potassium: 4.3 mEq/L (ref 3.5–5.1)
SODIUM: 139 meq/L (ref 136–145)
Total Bilirubin: 0.93 mg/dL (ref 0.20–1.20)
Total Protein: 6.7 g/dL (ref 6.4–8.3)

## 2016-06-21 LAB — CBC WITH DIFFERENTIAL/PLATELET
BASO%: 1.4 % (ref 0.0–2.0)
Basophils Absolute: 0 10*3/uL (ref 0.0–0.1)
EOS%: 3.5 % (ref 0.0–7.0)
Eosinophils Absolute: 0.1 10*3/uL (ref 0.0–0.5)
HEMATOCRIT: 32.3 % — AB (ref 34.8–46.6)
HEMOGLOBIN: 10.8 g/dL — AB (ref 11.6–15.9)
LYMPH#: 0.6 10*3/uL — AB (ref 0.9–3.3)
LYMPH%: 20.8 % (ref 14.0–49.7)
MCH: 32.6 pg (ref 25.1–34.0)
MCHC: 33.4 g/dL (ref 31.5–36.0)
MCV: 97.6 fL (ref 79.5–101.0)
MONO#: 0.2 10*3/uL (ref 0.1–0.9)
MONO%: 8.5 % (ref 0.0–14.0)
NEUT#: 1.9 10*3/uL (ref 1.5–6.5)
NEUT%: 65.8 % (ref 38.4–76.8)
Platelets: 150 10*3/uL (ref 145–400)
RBC: 3.31 10*6/uL — ABNORMAL LOW (ref 3.70–5.45)
RDW: 14.6 % — AB (ref 11.2–14.5)
WBC: 2.8 10*3/uL — AB (ref 3.9–10.3)

## 2016-06-21 MED ORDER — SODIUM CHLORIDE 0.9% FLUSH
10.0000 mL | INTRAVENOUS | Status: DC | PRN
  Administered 2016-06-21: 10 mL
  Filled 2016-06-21: qty 10

## 2016-06-21 MED ORDER — PACLITAXEL PROTEIN-BOUND CHEMO INJECTION 100 MG
80.0000 mg/m2 | Freq: Once | INTRAVENOUS | Status: AC
  Administered 2016-06-21: 150 mg via INTRAVENOUS
  Filled 2016-06-21: qty 30

## 2016-06-21 MED ORDER — SODIUM CHLORIDE 0.9 % IV SOLN
Freq: Once | INTRAVENOUS | Status: AC
  Administered 2016-06-21: 12:00:00 via INTRAVENOUS

## 2016-06-21 MED ORDER — PROCHLORPERAZINE MALEATE 10 MG PO TABS
10.0000 mg | ORAL_TABLET | Freq: Once | ORAL | Status: AC
  Administered 2016-06-21: 10 mg via ORAL

## 2016-06-21 MED ORDER — HEPARIN SOD (PORK) LOCK FLUSH 100 UNIT/ML IV SOLN
500.0000 [IU] | Freq: Once | INTRAVENOUS | Status: AC | PRN
  Administered 2016-06-21: 500 [IU]
  Filled 2016-06-21: qty 5

## 2016-06-21 MED ORDER — PROCHLORPERAZINE MALEATE 10 MG PO TABS
ORAL_TABLET | ORAL | Status: AC
  Filled 2016-06-21: qty 1

## 2016-06-21 NOTE — Patient Instructions (Signed)
[facility] Discharge Instructions for Patients Receiving Chemotherapy  Today you received the following chemotherapy agents: Abraxane   To help prevent nausea and vomiting after your treatment, we encourage you to take your nausea medication as directed.    If you develop nausea and vomiting that is not controlled by your nausea medication, call the clinic.   BELOW ARE SYMPTOMS THAT SHOULD BE REPORTED IMMEDIATELY:  *FEVER GREATER THAN 100.5 F  *CHILLS WITH OR WITHOUT FEVER  NAUSEA AND VOMITING THAT IS NOT CONTROLLED WITH YOUR NAUSEA MEDICATION  *UNUSUAL SHORTNESS OF BREATH  *UNUSUAL BRUISING OR BLEEDING  TENDERNESS IN MOUTH AND THROAT WITH OR WITHOUT PRESENCE OF ULCERS  *URINARY PROBLEMS  *BOWEL PROBLEMS  UNUSUAL RASH Items with * indicate a potential emergency and should be followed up as soon as possible.  Feel free to call the clinic you have any questions or concerns. The clinic phone number is [phone].  Please show the CHEMO ALERT CARD at check-in to the Emergency Department and triage nurse.   

## 2016-06-21 NOTE — Progress Notes (Signed)
Patient Care Team: Estill Bamberg. [name], MD as PCP - General (Internal Medicine) Alphonsa Overall, MD as Consulting Physician (General Surgery) Nicholas Lose, MD as Consulting Physician (Hematology and Oncology) Kyung Rudd, MD as Consulting Physician (Radiation Oncology)  DIAGNOSIS:  Encounter Diagnosis  Name Primary?  . Malignant neoplasm of upper-inner quadrant of right breast in female, estrogen receptor negative ([place])     SUMMARY OF ONCOLOGIC HISTORY:   Breast cancer of upper-inner quadrant of right female breast ([place])   02/29/2016 Initial Diagnosis    Right breast palpable mass (with silicone implants 3559), 3.5 cm on MRI, additional 3 cm anterior linear enhancement? 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 Ki-67 60%, T2 N0 stage 2A clinical stage      03/14/2016 Procedure    Right breast biopsy upper inner quadrant: IDC grade 3      03/28/2016 -  Neo-Adjuvant Chemotherapy    Neoadjuvant chemotherapy with dose dense Adriamycin and Cytoxan followed by Abraxane weekly 12 ( patient is diabetic and cannot take steroids)       CHIEF COMPLIANT: Cycle 4 Abraxane  INTERVAL HISTORY: Keeana Pieratt is a 76 year old with right breast cancer currently on neoadjuvant chemotherapy. Today is cycle 4 Abraxane. She appears to be tolerating Abraxane fairly well. She does not have any neuropathy.she has had a few problems lately. She had car trouble as well as her refrigerator broke down.  REVIEW OF SYSTEMS:   Constitutional: Denies fevers, chills or abnormal weight loss Eyes: Denies blurriness of vision Ears, nose, mouth, throat, and face: Denies mucositis or sore throat Respiratory: Denies cough, dyspnea or wheezes Cardiovascular: Denies palpitation, chest discomfort Gastrointestinal:  Denies nausea, heartburn or change in bowel habits Skin: Denies abnormal skin rashes Lymphatics: Denies new lymphadenopathy or easy bruising Neurological:Denies numbness, tingling or new  weaknesses Behavioral/Psych: Mood is stable, no new changes  Extremities: No lower extremity edema Breast:  denies any pain or lumps or nodules in either breasts All other systems were reviewed with the patient and are negative.  I have reviewed the past medical history, past surgical history, social history and family history with the patient and they are unchanged from previous note.  ALLERGIES:  is allergic to other and penicillins.  MEDICATIONS:  Current Outpatient Prescriptions  Medication Sig Dispense Refill  . atorvastatin (LIPITOR) 80 MG tablet Take 80 mg by mouth every evening.     [name] Kitchen azelastine (ASTELIN) 0.1 % nasal spray Place 2 sprays into both nostrils daily as needed for rhinitis or allergies. Use in each nostril as directed    . gabapentin (NEURONTIN) 100 MG capsule Take 200 mg by mouth at bedtime.    [name] Kitchen levothyroxine (SYNTHROID, LEVOTHROID) 100 MCG tablet Take 100 mcg by mouth daily before breakfast.     . lisinopril (PRINIVIL,ZESTRIL) 20 MG tablet Take 20 mg by mouth every evening.     . magic mouthwash w/lidocaine SOLN 5-37m by mouth 4 times a day as needed. Swish, swallow, or spit 240 mL 1  . metFORMIN (GLUCOPHAGE-XR) 500 MG 24 hr tablet Take 1,000 mg by mouth daily with supper.     .[name] Kitchenomeprazole (PRILOSEC) 40 MG capsule Take 1 capsule (40 mg total) by mouth daily. 30 capsule 2  . Vitamin D, Ergocalciferol, (DRISDOL) 50000 units CAPS capsule Take 50,000 Units by mouth every 7 (seven) days. Saturdays     No current facility-administered medications for this visit.    Facility-Administered Medications Ordered in Other Visits  Medication Dose Route Frequency Provider Last Rate  Last Dose  . heparin lock flush 100 unit/mL  500 Units Intracatheter Once PRN Nicholas Lose, MD      . PACLitaxel-protein bound (ABRAXANE) chemo infusion 150 mg  80 mg/m2 (Treatment Plan Recorded) Intravenous Once Nicholas Lose, MD 60 mL/hr at 06/21/16 1317 150 mg at 06/21/16 1317  . sodium chloride  flush (NS) 0.9 % injection 10 mL  10 mL Intracatheter PRN Nicholas Lose, MD        PHYSICAL EXAMINATION: ECOG PERFORMANCE STATUS: 1 - Symptomatic but completely ambulatory  Vitals:   06/21/16 1130  BP: (!) 180/68  Pulse: 74  Resp: 18  Temp: 98.1 F (36.7 C)   Filed Weights   06/21/16 1130  Weight: 178 lb 14.4 oz (81.1 kg)    GENERAL:alert, no distress and comfortable SKIN: skin color, texture, turgor are normal, no rashes or significant lesions EYES: normal, Conjunctiva are pink and non-injected, sclera clear OROPHARYNX:no exudate, no erythema and lips, buccal mucosa, and tongue normal  NECK: supple, thyroid normal size, non-tender, without nodularity LYMPH:  no palpable lymphadenopathy in the cervical, axillary or inguinal LUNGS: clear to auscultation and percussion with normal breathing effort HEART: regular rate & rhythm and no murmurs and no lower extremity edema ABDOMEN:abdomen soft, non-tender and normal bowel sounds MUSCULOSKELETAL:no cyanosis of digits and no clubbing  NEURO: alert & oriented x 3 with fluent speech, no focal motor/sensory deficits EXTREMITIES: No lower extremity edema  LABORATORY DATA:  I have reviewed the data as listed   Chemistry      Component Value Date/Time   NA 139 06/21/2016 1017   K 4.3 06/21/2016 1017   CL 103 03/16/2016 0506   CO2 25 06/21/2016 1017   BUN 9.8 06/21/2016 1017   CREATININE 0.9 06/21/2016 1017      Component Value Date/Time   CALCIUM 9.6 06/21/2016 1017   ALKPHOS 96 06/21/2016 1017   AST 50 (H) 06/21/2016 1017   ALT 38 06/21/2016 1017   BILITOT 0.93 06/21/2016 1017       Lab Results  Component Value Date   WBC 2.8 (L) 06/21/2016   HGB 10.8 (L) 06/21/2016   HCT 32.3 (L) 06/21/2016   MCV 97.6 06/21/2016   PLT 150 06/21/2016   NEUTROABS 1.9 06/21/2016    ASSESSMENT & PLAN:  Breast cancer of upper-inner quadrant of right female breast ([place]) 02/29/2016: Right breast palpable mass (with silicone implants  6222), 3.5 cm on MRI, additional 3 cm anterior linear enhancement (biopsy 03/14/2016 IDC grade 3); 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  T2 N0 stage 2A clinical stage  Recommendation: 1. Neoadjuvant chemotherapy with dose dense Adriamycin and Cytoxan 4 followed by Abraxane weekly 12 (patient is diabetic and cannot takesteroids) 2. followed by breast conserving surgery (with plastic surgery help regarding implant rupture) 3. Followed by radiation therapy ----------------------------------------------------------------------------------------------------------------------------------------- Current treatment: Completed 4 cycles of dose dense Adriamycin and Cytoxan, today is cycle 4 Abraxane  Echocardiogram 03/25/2016: EF 60-65% Labs reviewed  Chemotherapy toxicities: 1. Grade 3 neutropenia: with AC 2. grade 2 nausea:Improved 3. Fatigue: Due to chemotherapy  4.Severe constipation:improved with stool softeners Closely monitoring for chemotherapy toxicities.  Diabetes mellitus: I instructed her about strict glycemic control.  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 Now on Neurontin at bedtime 300 mg. If necessary we may add Neurontin 100 mg in the daytime. She tells me that she feels drowsy when she takes Neurontin and that's what she prefers not to take it through the day. She is  still trying to work as much as she can during chemotherapy. Sophia the neuropathy has remained stable. We plan to continue with the same treatment and to the point comes where her neuropathy may start to progress and then we may have to stop her treatment.  Mildly elevated AST and ALT: being observed Patient has had a few alcoholic beverages recently. All  RTC in 2 weeks and weekly for chemo.   I spent 25 minutes talking to the patient of which more than half was spent in counseling and coordination of care.  No orders of the defined types were placed in this  encounter.  The patient has a good understanding of the overall plan. she agrees with it. she will call with any problems that may develop before the next visit here.   Rulon Eisenmenger, MD 06/21/16

## 2016-06-24 ENCOUNTER — Other Ambulatory Visit: Payer: Self-pay

## 2016-06-24 DIAGNOSIS — C50211 Malignant neoplasm of upper-inner quadrant of right female breast: Secondary | ICD-10-CM

## 2016-06-24 DIAGNOSIS — Z171 Estrogen receptor negative status [ER-]: Principal | ICD-10-CM

## 2016-06-27 ENCOUNTER — Other Ambulatory Visit: Payer: Self-pay | Admitting: Hematology and Oncology

## 2016-06-27 ENCOUNTER — Other Ambulatory Visit (HOSPITAL_BASED_OUTPATIENT_CLINIC_OR_DEPARTMENT_OTHER): Payer: Medicare Other

## 2016-06-27 ENCOUNTER — Ambulatory Visit (HOSPITAL_BASED_OUTPATIENT_CLINIC_OR_DEPARTMENT_OTHER): Payer: Medicare Other

## 2016-06-27 ENCOUNTER — Ambulatory Visit: Payer: Medicare Other

## 2016-06-27 DIAGNOSIS — C50211 Malignant neoplasm of upper-inner quadrant of right female breast: Secondary | ICD-10-CM

## 2016-06-27 DIAGNOSIS — Z171 Estrogen receptor negative status [ER-]: Principal | ICD-10-CM

## 2016-06-27 DIAGNOSIS — Z5111 Encounter for antineoplastic chemotherapy: Secondary | ICD-10-CM

## 2016-06-27 LAB — COMPREHENSIVE METABOLIC PANEL
ALT: 45 U/L (ref 0–55)
AST: 59 U/L — ABNORMAL HIGH (ref 5–34)
Albumin: 4 g/dL (ref 3.5–5.0)
Alkaline Phosphatase: 90 U/L (ref 40–150)
Anion Gap: 9 mEq/L (ref 3–11)
BUN: 13.4 mg/dL (ref 7.0–26.0)
CALCIUM: 10 mg/dL (ref 8.4–10.4)
CHLORIDE: 104 meq/L (ref 98–109)
CO2: 24 meq/L (ref 22–29)
CREATININE: 0.8 mg/dL (ref 0.6–1.1)
EGFR: 75 mL/min/{1.73_m2} — ABNORMAL LOW (ref >90)
Glucose: 122 mg/dl (ref 70–140)
Potassium: 5 mEq/L (ref 3.5–5.1)
Sodium: 137 mEq/L (ref 136–145)
Total Bilirubin: 1.44 mg/dL — ABNORMAL HIGH (ref 0.20–1.20)
Total Protein: 6.9 g/dL (ref 6.4–8.3)

## 2016-06-27 LAB — CBC WITH DIFFERENTIAL/PLATELET
BASO%: 3.1 % — AB (ref 0.0–2.0)
Basophils Absolute: 0.1 10*3/uL (ref 0.0–0.1)
EOS%: 2.3 % (ref 0.0–7.0)
Eosinophils Absolute: 0 10*3/uL (ref 0.0–0.5)
HEMATOCRIT: 31.2 % — AB (ref 34.8–46.6)
HEMOGLOBIN: 10.6 g/dL — AB (ref 11.6–15.9)
LYMPH#: 0.7 10*3/uL — AB (ref 0.9–3.3)
LYMPH%: 35.1 % (ref 14.0–49.7)
MCH: 33.6 pg (ref 25.1–34.0)
MCHC: 33.9 g/dL (ref 31.5–36.0)
MCV: 99.1 fL (ref 79.5–101.0)
MONO#: 0.1 10*3/uL (ref 0.1–0.9)
MONO%: 6.8 % (ref 0.0–14.0)
NEUT#: 1 10*3/uL — ABNORMAL LOW (ref 1.5–6.5)
NEUT%: 52.7 % (ref 38.4–76.8)
Platelets: 155 10*3/uL (ref 145–400)
RBC: 3.15 10*6/uL — ABNORMAL LOW (ref 3.70–5.45)
RDW: 15.9 % — AB (ref 11.2–14.5)
WBC: 2 10*3/uL — ABNORMAL LOW (ref 3.9–10.3)

## 2016-06-27 MED ORDER — PACLITAXEL PROTEIN-BOUND CHEMO INJECTION 100 MG
65.0000 mg/m2 | Freq: Once | INTRAVENOUS | Status: AC
  Administered 2016-06-27: 125 mg via INTRAVENOUS
  Filled 2016-06-27: qty 25

## 2016-06-27 MED ORDER — PROCHLORPERAZINE MALEATE 10 MG PO TABS
ORAL_TABLET | ORAL | Status: AC
  Filled 2016-06-27: qty 1

## 2016-06-27 MED ORDER — SODIUM CHLORIDE 0.9 % IV SOLN
Freq: Once | INTRAVENOUS | Status: AC
  Administered 2016-06-27: 11:00:00 via INTRAVENOUS

## 2016-06-27 MED ORDER — PROCHLORPERAZINE MALEATE 10 MG PO TABS
10.0000 mg | ORAL_TABLET | Freq: Once | ORAL | Status: AC
  Administered 2016-06-27: 10 mg via ORAL

## 2016-06-27 MED ORDER — SODIUM CHLORIDE 0.9% FLUSH
10.0000 mL | INTRAVENOUS | Status: DC | PRN
  Administered 2016-06-27: 10 mL
  Filled 2016-06-27: qty 10

## 2016-06-27 MED ORDER — HEPARIN SOD (PORK) LOCK FLUSH 100 UNIT/ML IV SOLN
500.0000 [IU] | Freq: Once | INTRAVENOUS | Status: AC | PRN
  Administered 2016-06-27: 500 [IU]
  Filled 2016-06-27: qty 5

## 2016-06-27 NOTE — Patient Instructions (Signed)
[facility] Discharge Instructions for Patients Receiving Chemotherapy  Today you received the following chemotherapy agents: Abraxane   To help prevent nausea and vomiting after your treatment, we encourage you to take your nausea medication as directed.    If you develop nausea and vomiting that is not controlled by your nausea medication, call the clinic.   BELOW ARE SYMPTOMS THAT SHOULD BE REPORTED IMMEDIATELY:  *FEVER GREATER THAN 100.5 F  *CHILLS WITH OR WITHOUT FEVER  NAUSEA AND VOMITING THAT IS NOT CONTROLLED WITH YOUR NAUSEA MEDICATION  *UNUSUAL SHORTNESS OF BREATH  *UNUSUAL BRUISING OR BLEEDING  TENDERNESS IN MOUTH AND THROAT WITH OR WITHOUT PRESENCE OF ULCERS  *URINARY PROBLEMS  *BOWEL PROBLEMS  UNUSUAL RASH Items with * indicate a potential emergency and should be followed up as soon as possible.  Feel free to call the clinic you have any questions or concerns. The clinic phone number is [phone].  Please show the CHEMO ALERT CARD at check-in to the Emergency Department and triage nurse.   

## 2016-06-27 NOTE — Progress Notes (Signed)
Ok to treat with ANC 1.0 per May Armel, RN with Dr. Lindi Adie.  Will dose reduce abraxane.  Infusion nurse aware.

## 2016-06-27 NOTE — Patient Instructions (Signed)

## 2016-06-27 NOTE — Progress Notes (Signed)
Okay to treat with neutrophils of 1.0 per May, RN per Dr. Lindi Adie.

## 2016-06-27 NOTE — Progress Notes (Signed)
Ok to treat with anc 1.0 per Dr.Gudena. Dose reduced abraxane today's treatment.

## 2016-06-28 ENCOUNTER — Ambulatory Visit: Payer: Medicare Other

## 2016-06-28 ENCOUNTER — Other Ambulatory Visit: Payer: Medicare Other

## 2016-06-30 ENCOUNTER — Telehealth: Payer: Self-pay | Admitting: *Deleted

## 2016-06-30 NOTE — Progress Notes (Signed)
Called pt to follow up with her symptoms to see if she needs symptom management tomorrow. Left detailed vm and call back number because unable to reach pt at this time.

## 2016-06-30 NOTE — Telephone Encounter (Signed)
"  I need someone to call me [phone]) and tell me what to do about the symptoms I'm having.  I can't get out of bed or get to work and this is our busiest time.  I'm an Optometrist.  I'm so exhausted.  I have upset stomach.  Took a nausea pill today.  No vomiting.  I feel cold, I'm going downhill fast, couldn't confirm the appointment for Monday.  Temp = 96.3 with this call.  I don't have appetite but I eat and drink.  Bowels are moving more than usual, two to three times a day.  Lot of gas and stomach grumbling.  I drink sports drinks and water.  I have a lot of leg cramps and trying to get potassium with bananas and sports drinks.  Also ate steel cut oats with berries, cheerios, kale.   Having a hard time getting motivated to get up and out the house.  I feel limp when I get out of bed.  Had neuropathy in my feet and now have these red places or spots that are not close together on my legs and arms, none on the body.    Will notify provider.  06-27-2016 potasium = 5, platelets = 155. Started second cycle Abraxane.  Congratulated her eating healthy.  Advised to balance incorporating low fiber while adding yogurt daily.  Asked if FMLA is an option for her work.

## 2016-07-01 ENCOUNTER — Telehealth: Payer: Self-pay

## 2016-07-01 ENCOUNTER — Ambulatory Visit: Payer: Medicare Other

## 2016-07-01 ENCOUNTER — Encounter: Payer: Medicare Other | Admitting: Nurse Practitioner

## 2016-07-01 ENCOUNTER — Other Ambulatory Visit: Payer: Self-pay

## 2016-07-01 ENCOUNTER — Telehealth: Payer: Self-pay | Admitting: *Deleted

## 2016-07-01 DIAGNOSIS — Z171 Estrogen receptor negative status [ER-]: Principal | ICD-10-CM

## 2016-07-01 DIAGNOSIS — C50211 Malignant neoplasm of upper-inner quadrant of right female breast: Secondary | ICD-10-CM

## 2016-07-01 MED ORDER — MAGNESIUM OXIDE 400 (241.3 MG) MG PO TABS: 600.0000 mg | ORAL_TABLET | Freq: Every day | ORAL | 1 refills | Status: DC

## 2016-07-01 NOTE — Telephone Encounter (Signed)
Spoke with pt this morning and she is in a lot of stress. 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 She had trouble with her car door and will need to get it fixed. Was out of town and stayed at a hotel and came home to a broken fridge. Pt also found out that her son had a stroke in [place] (he is doing well). Pt has a funeral of a family friend to go to this weekend. Pt has not had any sleep. She states that she was so anxious that she took ativan yesterday and she slept all night. Pt is also dealing with nausea from chemo (takes nausea medication) but has not had any appetite to eat. Pt states that she drinks electrolyte drinks but not enough. Pt very stressed and depressed at this time. She noticed some red tiny spots around her arms and legs that has not appeared before. Last chemo Abraxane was 1/22 ANC was 1 and [id number]. Pt denies fever, chills, vomiting, sob or cp at this time. Pt requesting not to take her chemo next Monday 1/29 because of a very hectic week at work and how "run down" she feels.  Discussed all this with Dr.Gudena and okay to skip treatment next week and to have pt come in this afternoon for IVF's, labs, and to see SM. Pt agreeable to this but can't come until 4 or 430pm. Will call infusion charge nurse to see if we can add pt for fluids today and will call to confirm time with pt. Pt also wants to speak with nurse navigator, Varney Biles about her situation. Told pt that we will notify her today of request.

## 2016-07-01 NOTE — Telephone Encounter (Signed)
Telephone encounter created in error.

## 2016-07-01 NOTE — Progress Notes (Signed)
Per Dr.Gudena, pt does not need to come in today for IV hydration. Sent escribe for magnesium 600mg  to her [employer]. Called to notify pt.

## 2016-07-01 NOTE — Consult Note (Addendum)
[facility] Support Note  Called pt to introduce self and Support Center's services.   Pt reported her desire to attend support group Engineer, site) again, but feels too exhausted after work and taking care of her pets to travel to [place] to attend. Pt shared she has experienced numerous challenges this week, including home, car, and family concerns, and she wants to make an effort to talk to someone in the [place]. Pt reported feeling isolated from family, and wished she "had somebody" in [place] (family in [place] and [place]) After discussing her options, pt requested a one on one counseling appt with Counseling Intern, Becca Jayceon [place]/MS, LPCA, [place], on Thursday, 2/1 at 3:30p.   Pt reiterated [facility]'s "incredible support," from her care team and the [place] throughout the phone call. Pt sounds motivated to attend counseling sessions.  [place], [place] LPCA [place] Voicemail: [phone]

## 2016-07-01 NOTE — Progress Notes (Signed)
LVM this morning, Dr.Gudena would like pt to come in today to follow up on her symptoms. Gave pt call back number.

## 2016-07-01 NOTE — Progress Notes (Signed)
[facility] Counseling Support Note  Called pt to introduce self and Support Center's services.   Pt reported her desire to attend support group Engineer, site) again, but feels too exhausted after work and taking care of her pets to travel to [place] to attend. Pt shared she has experienced numerous challenges this week, including home, car, and family concerns, and she wants to make an effort to talk to someone in the [place]. Pt reported feeling isolated from family, and wished she "had somebody" in [place] (family in [place] and [place]) After discussing her options, pt requested a one on one counseling appt with Counseling Intern, Becca Telecia [place]/MS, LPCA, [place], on Thursday, 2/1 at 3:30p.   Pt reiterated [facility]'s "incredible support," from her care team and the [place] throughout the phone call. Pt sounds motivated to attend counseling sessions.  [place], [place] LPCA [place] Voicemail: [phone]

## 2016-07-01 NOTE — Telephone Encounter (Signed)
Spoke with patient.  She states she has had a tough week.  Her refrigerator and car broke down, her son had a stroke, she is depressed and exhausted.  She states she does not sleep much at night. She takes Ativan and this seems to help some.  She agreed to let our chaplain call her. She has an Network engineer but hasn't spoken with her in awhile.  Encouraged her to touch base with her. She may need a referral to a specialist.

## 2016-07-04 ENCOUNTER — Ambulatory Visit (HOSPITAL_BASED_OUTPATIENT_CLINIC_OR_DEPARTMENT_OTHER): Payer: Medicare Other | Admitting: Hematology and Oncology

## 2016-07-04 ENCOUNTER — Ambulatory Visit: Payer: Medicare Other

## 2016-07-04 ENCOUNTER — Ambulatory Visit (HOSPITAL_BASED_OUTPATIENT_CLINIC_OR_DEPARTMENT_OTHER): Payer: Medicare Other

## 2016-07-04 ENCOUNTER — Encounter: Payer: Self-pay | Admitting: Hematology and Oncology

## 2016-07-04 ENCOUNTER — Ambulatory Visit: Payer: Medicare Other | Admitting: Hematology and Oncology

## 2016-07-04 ENCOUNTER — Other Ambulatory Visit: Payer: Medicare Other

## 2016-07-04 ENCOUNTER — Other Ambulatory Visit: Payer: Self-pay | Admitting: Emergency Medicine

## 2016-07-04 DIAGNOSIS — G620 Drug-induced polyneuropathy: Secondary | ICD-10-CM

## 2016-07-04 DIAGNOSIS — C50211 Malignant neoplasm of upper-inner quadrant of right female breast: Secondary | ICD-10-CM

## 2016-07-04 DIAGNOSIS — Z171 Estrogen receptor negative status [ER-]: Secondary | ICD-10-CM

## 2016-07-04 DIAGNOSIS — Z452 Encounter for adjustment and management of vascular access device: Secondary | ICD-10-CM

## 2016-07-04 DIAGNOSIS — D701 Agranulocytosis secondary to cancer chemotherapy: Secondary | ICD-10-CM

## 2016-07-04 DIAGNOSIS — E1140 Type 2 diabetes mellitus with diabetic neuropathy, unspecified: Secondary | ICD-10-CM | POA: Diagnosis not present

## 2016-07-04 DIAGNOSIS — Z95828 Presence of other vascular implants and grafts: Secondary | ICD-10-CM

## 2016-07-04 HISTORY — DX: Adverse effect of antineoplastic and immunosuppressive drugs, initial encounter: G62.0

## 2016-07-04 LAB — CBC WITH DIFFERENTIAL/PLATELET
BASO%: 0.3 % (ref 0.0–2.0)
BASOS ABS: 0 10*3/uL (ref 0.0–0.1)
EOS ABS: 0 10*3/uL (ref 0.0–0.5)
EOS%: 1.5 % (ref 0.0–7.0)
HCT: 32.7 % — ABNORMAL LOW (ref 34.8–46.6)
HGB: 11 g/dL — ABNORMAL LOW (ref 11.6–15.9)
LYMPH%: 32.9 % (ref 14.0–49.7)
MCH: 33.7 pg (ref 25.1–34.0)
MCHC: 33.7 g/dL (ref 31.5–36.0)
MCV: 100.1 fL (ref 79.5–101.0)
MONO#: 0.2 10*3/uL (ref 0.1–0.9)
MONO%: 8.1 % (ref 0.0–14.0)
NEUT#: 1.1 10*3/uL — ABNORMAL LOW (ref 1.5–6.5)
NEUT%: 57.2 % (ref 38.4–76.8)
Platelets: 180 10*3/uL (ref 145–400)
RBC: 3.27 10*6/uL — ABNORMAL LOW (ref 3.70–5.45)
RDW: 16.3 % — ABNORMAL HIGH (ref 11.2–14.5)
WBC: 1.9 10*3/uL — ABNORMAL LOW (ref 3.9–10.3)
lymph#: 0.6 10*3/uL — ABNORMAL LOW (ref 0.9–3.3)

## 2016-07-04 LAB — COMPREHENSIVE METABOLIC PANEL
ALT: 44 U/L (ref 0–55)
AST: 49 U/L — AB (ref 5–34)
Albumin: 4.2 g/dL (ref 3.5–5.0)
Alkaline Phosphatase: 95 U/L (ref 40–150)
Anion Gap: 11 mEq/L (ref 3–11)
BUN: 12 mg/dL (ref 7.0–26.0)
CHLORIDE: 105 meq/L (ref 98–109)
CO2: 24 mEq/L (ref 22–29)
Calcium: 9.5 mg/dL (ref 8.4–10.4)
Creatinine: 0.8 mg/dL (ref 0.6–1.1)
EGFR: 69 mL/min/{1.73_m2} — AB (ref >90)
GLUCOSE: 163 mg/dL — AB (ref 70–140)
Potassium: 3.9 mEq/L (ref 3.5–5.1)
SODIUM: 140 meq/L (ref 136–145)
Total Bilirubin: 1.14 mg/dL (ref 0.20–1.20)
Total Protein: 7.1 g/dL (ref 6.4–8.3)

## 2016-07-04 LAB — MAGNESIUM: Magnesium: 2.1 mg/dl (ref 1.5–2.5)

## 2016-07-04 MED ORDER — HEPARIN SOD (PORK) LOCK FLUSH 100 UNIT/ML IV SOLN
500.0000 [IU] | Freq: Once | INTRAVENOUS | Status: AC | PRN
  Administered 2016-07-04: 500 [IU] via INTRAVENOUS
  Filled 2016-07-04: qty 5

## 2016-07-04 MED ORDER — ALTEPLASE 2 MG IJ SOLR
2.0000 mg | Freq: Once | INTRAMUSCULAR | Status: AC | PRN
  Administered 2016-07-04: 2 mg
  Filled 2016-07-04: qty 2

## 2016-07-04 MED ORDER — SODIUM CHLORIDE 0.9% FLUSH
10.0000 mL | INTRAVENOUS | Status: DC | PRN
  Administered 2016-07-04: 10 mL via INTRAVENOUS
  Filled 2016-07-04: qty 10

## 2016-07-04 NOTE — Progress Notes (Signed)
Patient Care Team: Estill Bamberg. [name], MD as PCP - General (Internal Medicine) Alphonsa Overall, MD as Consulting Physician (General Surgery) Nicholas Lose, MD as Consulting Physician (Hematology and Oncology) Kyung Rudd, MD as Consulting Physician (Radiation Oncology)  DIAGNOSIS:  Encounter Diagnosis  Name Primary?  . Malignant neoplasm of upper-inner quadrant of right breast in female, estrogen receptor negative ([place])     SUMMARY OF ONCOLOGIC HISTORY:   Breast cancer of upper-inner quadrant of right female breast ([place])   02/29/2016 Initial Diagnosis    Right breast palpable mass (with silicone implants 5366), 3.5 cm on MRI, additional 3 cm anterior linear enhancement? 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 Ki-67 60%, T2 N0 stage 2A clinical stage      03/14/2016 Procedure    Right breast biopsy upper inner quadrant: IDC grade 3      03/28/2016 -  Neo-Adjuvant Chemotherapy    Neoadjuvant chemotherapy with dose dense Adriamycin and Cytoxan followed by Abraxane weekly 12 ( patient is diabetic and cannot take steroids)       CHIEF COMPLIANT: Chemotherapy side effects including fatigue, muscle cramps and aches, nausea, abdominal distention  INTERVAL HISTORY: Marissa Hall is a 76 year old with above-mentioned history of neoadjuvant chemotherapy for right breast cancer. She is currently on Abraxane. She is unable to tolerate Abraxane because of fatigue, leg cramps, nausea and abdominal distention. We had discussed on the telephone last week and decided that we should hold off on giving her any chemotherapy this week to give her a break so that we can reinitiate treatment next week. She has chronic neuropathy in the feet related to diabetes. It appears to be slightly worse. I prescribed her magnesium supplementation and since then her muscle cramps and aches have improved. She still continues to have neuropathy in the entire foot.   REVIEW OF SYSTEMS:   Constitutional: Denies  fevers, chills or abnormal weight loss Eyes: Denies blurriness of vision Ears, nose, mouth, throat, and face: Denies mucositis or sore throat Respiratory: Denies cough, dyspnea or wheezes Cardiovascular: Denies palpitation, chest discomfort Gastrointestinal:  Nausea and diarrhea Skin: Denies abnormal skin rashes Lymphatics: Denies new lymphadenopathy or easy bruising Neurological: Neuropathy in the feet Behavioral/Psych: Mood is stable, no new changes  Extremities: No lower extremity edema, musculoskeletal cramps Breast:  denies any pain or lumps or nodules in either breasts All other systems were reviewed with the patient and are negative.  I have reviewed the past medical history, past surgical history, social history and family history with the patient and they are unchanged from previous note.  ALLERGIES:  is allergic to other and penicillins.  MEDICATIONS:  Current Outpatient Prescriptions  Medication Sig Dispense Refill  . atorvastatin (LIPITOR) 80 MG tablet Take 80 mg by mouth every evening.     [name] Kitchen azelastine (ASTELIN) 0.1 % nasal spray Place 2 sprays into both nostrils daily as needed for rhinitis or allergies. Use in each nostril as directed    . gabapentin (NEURONTIN) 100 MG capsule Take 200 mg by mouth at bedtime.    [name] Kitchen levothyroxine (SYNTHROID, LEVOTHROID) 100 MCG tablet Take 100 mcg by mouth daily before breakfast.     . lisinopril (PRINIVIL,ZESTRIL) 20 MG tablet Take 20 mg by mouth every evening.     . magic mouthwash w/lidocaine SOLN 5-34m by mouth 4 times a day as needed. Swish, swallow, or spit 240 mL 1  . magnesium oxide (MAG-OX) 400 (241.3 Mg) MG tablet Take 1.5 tablets (600 mg total) by mouth daily.  30 tablet 1  . metFORMIN (GLUCOPHAGE-XR) 500 MG 24 hr tablet Take 1,000 mg by mouth daily with supper.     [name] Kitchen omeprazole (PRILOSEC) 40 MG capsule Take 1 capsule (40 mg total) by mouth daily. 30 capsule 2  . Vitamin D, Ergocalciferol, (DRISDOL) 50000 units CAPS capsule Take  50,000 Units by mouth every 7 (seven) days. Saturdays     No current facility-administered medications for this visit.    Facility-Administered Medications Ordered in Other Visits  Medication Dose Route Frequency Provider Last Rate Last Dose  . sodium chloride flush (NS) 0.9 % injection 10 mL  10 mL Intravenous PRN Nicholas Lose, MD   10 mL at 07/04/16 1132    PHYSICAL EXAMINATION: ECOG PERFORMANCE STATUS: 1 - Symptomatic but completely ambulatory  Vitals:   07/04/16 1116  BP: (!) 147/58  Pulse: 63  Resp: 18  Temp: 97.6 F (36.4 C)   Filed Weights   07/04/16 1116  Weight: 175 lb 4.8 oz (79.5 kg)    GENERAL:alert, no distress and comfortable SKIN: skin color, texture, turgor are normal, no rashes or significant lesions EYES: normal, Conjunctiva are pink and non-injected, sclera clear OROPHARYNX:no exudate, no erythema and lips, buccal mucosa, and tongue normal  NECK: supple, thyroid normal size, non-tender, without nodularity LYMPH:  no palpable lymphadenopathy in the cervical, axillary or inguinal LUNGS: clear to auscultation and percussion with normal breathing effort HEART: regular rate & rhythm and no murmurs and no lower extremity edema ABDOMEN:abdomen soft, non-tender and normal bowel sounds MUSCULOSKELETAL:no cyanosis of digits and no clubbing  NEURO: alert & oriented x 3 with fluent speech, no focal motor/sensory deficits EXTREMITIES: No lower extremity edema  LABORATORY DATA:  I have reviewed the data as listed   Chemistry      Component Value Date/Time   NA 140 07/04/2016 1013   K 3.9 07/04/2016 1013   CL 103 03/16/2016 0506   CO2 24 07/04/2016 1013   BUN 12.0 07/04/2016 1013   CREATININE 0.8 07/04/2016 1013      Component Value Date/Time   CALCIUM 9.5 07/04/2016 1013   ALKPHOS 95 07/04/2016 1013   AST 49 (H) 07/04/2016 1013   ALT 44 07/04/2016 1013   BILITOT 1.14 07/04/2016 1013       Lab Results  Component Value Date   WBC 1.9 (L) 07/04/2016    HGB 11.0 (L) 07/04/2016   HCT 32.7 (L) 07/04/2016   MCV 100.1 07/04/2016   PLT 180 07/04/2016   NEUTROABS 1.1 (L) 07/04/2016    ASSESSMENT & PLAN:  Breast cancer of upper-inner quadrant of right female breast ([place]) 02/29/2016: Right breast palpable mass (with silicone implants 5361), 3.5 cm on MRI, additional 3 cm anterior linear enhancement (biopsy 03/14/2016 IDC grade 3); 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  T2 N0 stage 2A clinical stage  Recommendation: 1. Neoadjuvant chemotherapy with dose dense Adriamycin and Cytoxan 4 followed by Abraxane weekly 12 (patient is diabetic and cannot takesteroids) 2. followed by breast conserving surgery (with plastic surgery help regarding implant rupture) 3. Followed by radiation therapy ----------------------------------------------------------------------------------------------------------------------------------------- Current treatment: Completed 4 cycles of dose dense Adriamycin and Cytoxan, today is cycle 6Abraxane (will be held for fatigue issues) Echocardiogram 03/25/2016: EF 60-65% Labs reviewed  Chemotherapy toxicities: 1. Grade 3 neutropenia: with AC 2. grade 2 nausea:Improved 3. Fatigue: Due to chemotherapy  4.Severe constipation:improved with stool softeners Closely monitoring for chemotherapy toxicities.  Diabetes mellitus: I instructed her about strict glycemic control.  Neuropathy:Patient has grade 2 neuropathy  in the feet from diabetes. Now onNeurontin at bedtime 300 mg. Decrease the dosage of Abraxane to 50 mg/m. We might be discontinuing her treatment zone based upon the neuropathy situation. I will see her after treatment break of one week. Patient is under extraordinary amount of stress from her work. This break will help for recover Mildly elevated AST and ALT: being observed Return to clinic in one week for restarting chemotherapy Patient is extremely busy with the tax season.  I spent 25 minutes  talking to the patient of which more than half was spent in counseling and coordination of care.  No orders of the defined types were placed in this encounter.  The patient has a good understanding of the overall plan. she agrees with it. she will call with any problems that may develop before the next visit here.   Rulon Eisenmenger, MD 07/04/16

## 2016-07-04 NOTE — Patient Instructions (Signed)
Implanted Port Insertion An implanted port is a central line that has a round shape and is placed under the skin. It is used as a long-term IV access for:   Medicines, such as chemotherapy.   Fluids.   Liquid nutrition, such as total parenteral nutrition (TPN).   Blood samples.  LET YOUR HEALTH CARE PROVIDER KNOW ABOUT:  Allergies to food or medicine.   Medicines taken, including vitamins, herbs, eye drops, creams, and over-the-counter medicines.   Any allergies to heparin.  Use of steroids (by mouth or creams).   Previous problems with anesthetics or numbing medicines.   History of bleeding problems or blood clots.   Previous surgery.   Other health problems, including diabetes and kidney problems.   Possibility of pregnancy, if this applies. RISKS AND COMPLICATIONS Generally, this is a safe procedure. However, as with any procedure, problems can occur. Possible problems include:  Damage to the blood vessel, bruising, or bleeding at the puncture site.   Infection.  Blood clot in the vessel that the port is in.  Breakdown of the skin over your port.  Very rarely a person may develop a condition called a pneumothorax, a collection of air in the chest that may cause one of the lungs to collapse. The placement of these catheters with the appropriate imaging guidance significantly decreases the risk of a pneumothorax.  BEFORE THE PROCEDURE   Your health care provider may want you to have blood tests. These tests can help tell how well your kidneys and liver are working. They can also show how well your blood clots.   If you take blood thinners (anticoagulant medicines), ask your health care provider when you should stop taking them.   Make arrangements for someone to drive you home. This is necessary if you have been sedated for your procedure.  PROCEDURE  Port insertion usually takes about 30-45 minutes.   An IV needle will be inserted in your arm.  Medicine for pain and medicine to help relax you (sedative) will flow directly into your body through this needle.   You will lie on an exam table, and you will be connected to monitors to keep track of your heart rate, blood pressure, and breathing throughout the procedure.  An oxygen monitoring device may be attached to your finger. Oxygen will be given.   Everything will be kept as germ free (sterile) as possible during the procedure. The skin near the point of the incision will be cleansed with antiseptic, and the area will be draped with sterile towels. The skin and deeper tissues over the port area will be made numb with a local anesthetic.  Two small cuts (incisions) will be made in the skin to insert the port. One will be made in the neck to obtain access to the vein where the catheter will lie.   Because the port reservoir will be placed under the skin, a small skin incision will be made in the upper chest, and a small pocket for the port will be made under the skin. The catheter that will be connected to the port tunnels to a large central vein in the chest. A small, raised area will remain on your body at the site of the reservoir when the procedure is complete.  The port placement will be done under imaging guidance to ensure the proper placement.  The reservoir has a silicone covering that can be punctured with a special needle.   The port will be flushed with normal   saline, and blood will be drawn to make sure it is working properly.  There will be nothing remaining outside the skin when the procedure is finished.   Incisions will be held together by stitches, surgical glue, or a special tape. AFTER THE PROCEDURE  You will stay in a recovery area until the anesthesia has worn off. Your blood pressure and pulse will be checked.  A final chest X-ray will be taken to check the placement of the port and to ensure that there is no injury to your lung. This information is not  intended to replace advice given to you by your health care provider. Make sure you discuss any questions you have with your health care provider. Document Released: 03/13/2013 Document Revised: 06/13/2014 Document Reviewed: 03/13/2013 Elsevier Interactive Patient Education  2017 [employer]  

## 2016-07-04 NOTE — Assessment & Plan Note (Signed)
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  T2 N0 stage 2A clinical stage  Recommendation: 1. Neoadjuvant chemotherapy with dose dense Adriamycin and Cytoxan 4 followed by Abraxane weekly 12 (patient is diabetic and cannot takesteroids) 2. followed by breast conserving surgery (with plastic surgery help regarding implant rupture) 3. Followed by radiation therapy ----------------------------------------------------------------------------------------------------------------------------------------- Current treatment: Completed 4 cycles of dose dense Adriamycin and Cytoxan, today is cycle 6Abraxane (will be held for fatigue issues) Echocardiogram 03/25/2016: EF 60-65% Labs reviewed  Chemotherapy toxicities: 1. Grade 3 neutropenia: with AC 2. grade 2 nausea:Improved 3. Fatigue: Due to chemotherapy  4.Severe constipation:improved with stool softeners Closely monitoring for chemotherapy toxicities.  Diabetes mellitus: I instructed her about strict glycemic control.  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 Now onNeurontin at bedtime 300 mg Mildly elevated AST and ALT: being observed

## 2016-07-05 ENCOUNTER — Ambulatory Visit: Payer: Medicare Other | Admitting: Hematology and Oncology

## 2016-07-05 ENCOUNTER — Ambulatory Visit: Payer: Medicare Other

## 2016-07-05 ENCOUNTER — Other Ambulatory Visit: Payer: Medicare Other

## 2016-07-08 NOTE — Progress Notes (Signed)
[facility] Counseling Note  PT cancelled counseling appt as she was unable to leave work. LVM with pt to reschedule.  Becca Saatvik Thielman/MS LPCA [place] Voicemail: [phone]

## 2016-07-11 ENCOUNTER — Other Ambulatory Visit: Payer: Medicare Other

## 2016-07-11 ENCOUNTER — Ambulatory Visit: Payer: Medicare Other

## 2016-07-11 ENCOUNTER — Ambulatory Visit (HOSPITAL_BASED_OUTPATIENT_CLINIC_OR_DEPARTMENT_OTHER): Payer: Medicare Other | Admitting: Hematology and Oncology

## 2016-07-11 ENCOUNTER — Encounter: Payer: Self-pay | Admitting: Hematology and Oncology

## 2016-07-11 ENCOUNTER — Other Ambulatory Visit (HOSPITAL_BASED_OUTPATIENT_CLINIC_OR_DEPARTMENT_OTHER): Payer: Medicare Other

## 2016-07-11 ENCOUNTER — Encounter: Payer: Self-pay | Admitting: *Deleted

## 2016-07-11 DIAGNOSIS — Z171 Estrogen receptor negative status [ER-]: Secondary | ICD-10-CM | POA: Diagnosis not present

## 2016-07-11 DIAGNOSIS — C50211 Malignant neoplasm of upper-inner quadrant of right female breast: Secondary | ICD-10-CM | POA: Diagnosis not present

## 2016-07-11 DIAGNOSIS — Z95828 Presence of other vascular implants and grafts: Secondary | ICD-10-CM

## 2016-07-11 DIAGNOSIS — E119 Type 2 diabetes mellitus without complications: Secondary | ICD-10-CM | POA: Diagnosis not present

## 2016-07-11 DIAGNOSIS — G609 Hereditary and idiopathic neuropathy, unspecified: Secondary | ICD-10-CM | POA: Diagnosis not present

## 2016-07-11 LAB — CBC WITH DIFFERENTIAL/PLATELET
BASO%: 3 % — ABNORMAL HIGH (ref 0.0–2.0)
BASOS ABS: 0.1 10*3/uL (ref 0.0–0.1)
EOS ABS: 0 10*3/uL (ref 0.0–0.5)
EOS%: 1.8 % (ref 0.0–7.0)
HEMATOCRIT: 32.5 % — AB (ref 34.8–46.6)
HEMOGLOBIN: 11 g/dL — AB (ref 11.6–15.9)
LYMPH#: 0.7 10*3/uL — AB (ref 0.9–3.3)
LYMPH%: 30.4 % (ref 14.0–49.7)
MCH: 33.7 pg (ref 25.1–34.0)
MCHC: 33.7 g/dL (ref 31.5–36.0)
MCV: 99.8 fL (ref 79.5–101.0)
MONO#: 0.3 10*3/uL (ref 0.1–0.9)
MONO%: 13.2 % (ref 0.0–14.0)
NEUT#: 1.3 10*3/uL — ABNORMAL LOW (ref 1.5–6.5)
NEUT%: 51.6 % (ref 38.4–76.8)
PLATELETS: 167 10*3/uL (ref 145–400)
RBC: 3.26 10*6/uL — ABNORMAL LOW (ref 3.70–5.45)
RDW: 15.9 % — ABNORMAL HIGH (ref 11.2–14.5)
WBC: 2.5 10*3/uL — ABNORMAL LOW (ref 3.9–10.3)

## 2016-07-11 LAB — COMPREHENSIVE METABOLIC PANEL
ALBUMIN: 3.9 g/dL (ref 3.5–5.0)
ALK PHOS: 88 U/L (ref 40–150)
ALT: 31 U/L (ref 0–55)
ANION GAP: 8 meq/L (ref 3–11)
AST: 28 U/L (ref 5–34)
BUN: 14.9 mg/dL (ref 7.0–26.0)
CALCIUM: 9.4 mg/dL (ref 8.4–10.4)
CHLORIDE: 106 meq/L (ref 98–109)
CO2: 25 mEq/L (ref 22–29)
CREATININE: 0.8 mg/dL (ref 0.6–1.1)
EGFR: 75 mL/min/{1.73_m2} — ABNORMAL LOW (ref >90)
Glucose: 181 mg/dl — ABNORMAL HIGH (ref 70–140)
POTASSIUM: 4.1 meq/L (ref 3.5–5.1)
Sodium: 140 mEq/L (ref 136–145)
Total Bilirubin: 0.86 mg/dL (ref 0.20–1.20)
Total Protein: 6.6 g/dL (ref 6.4–8.3)

## 2016-07-11 MED ORDER — HEPARIN SOD (PORK) LOCK FLUSH 100 UNIT/ML IV SOLN
500.0000 [IU] | Freq: Once | INTRAVENOUS | Status: DC | PRN
  Filled 2016-07-11: qty 5

## 2016-07-11 MED ORDER — SODIUM CHLORIDE 0.9% FLUSH
10.0000 mL | INTRAVENOUS | Status: DC | PRN
  Administered 2016-07-11: 10 mL via INTRAVENOUS
  Filled 2016-07-11: qty 10

## 2016-07-11 NOTE — Assessment & Plan Note (Signed)
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  T2 N0 stage 2A clinical stage  Recommendation: 1. Neoadjuvant chemotherapy with dose dense Adriamycin and Cytoxan 4 followed by Abraxane weekly 12 (patient is diabetic and cannot takesteroids) 2. followed by breast conserving surgery (with plastic surgery help regarding implant rupture) 3. Followed by radiation therapy ----------------------------------------------------------------------------------------------------------------------------------------- Current treatment: Completed 4 cycles of dose dense Adriamycin and Cytoxan, today is cycle 6Abraxane (chemotherapy was held for fatigue issues) Echocardiogram 03/25/2016: EF 60-65% Labs reviewed  Chemotherapy toxicities: 1. Grade 3 neutropenia: with AC 2. grade 2 nausea:Improved 3. Fatigue: Due to chemotherapy  4.Severe constipation:improved with stool softeners Closely monitoring for chemotherapy toxicities.  Diabetes mellitus: I instructed her about strict glycemic control.  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 Now onNeurontin at bedtime 300 mg. Decrease the dosage of Abraxane to 50 mg/m. We might be discontinuing her treatment based upon the neuropathy situation. I will see her after treatment break of one week.  Patient is under extraordinary amount of stress from her work. This break will help for recover.  Mildly elevated AST and ALT: being observed Return to clinic in one week for restarting chemotherapy Patient is extremely busy with the tax season.

## 2016-07-11 NOTE — Progress Notes (Signed)
Patient Care Team: Estill Bamberg. [name], MD as PCP - General (Internal Medicine) Alphonsa Overall, MD as Consulting Physician (General Surgery) Nicholas Lose, MD as Consulting Physician (Hematology and Oncology) Kyung Rudd, MD as Consulting Physician (Radiation Oncology)  DIAGNOSIS:  Encounter Diagnosis  Name Primary?  . Malignant neoplasm of upper-inner quadrant of right breast in female, estrogen receptor negative ([place])     SUMMARY OF ONCOLOGIC HISTORY:   Breast cancer of upper-inner quadrant of right female breast ([place])   02/29/2016 Initial Diagnosis    Right breast palpable mass (with silicone implants 4782), 3.5 cm on MRI, additional 3 cm anterior linear enhancement? 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 Today is supposed with cycle 6 of chemotherapy. We gave her a 2 week break from chemotherapy and it appears to help her significantly improve her symptoms. She continues to have numbness up to the ankle in both her feet. She does not have any neuropathy in the hands.  REVIEW OF SYSTEMS:   Constitutional: Denies fevers, chills or abnormal weight loss Eyes: Denies blurriness of vision Ears, nose, mouth, throat, and face: Denies mucositis or sore throat Respiratory: Denies cough, dyspnea or wheezes Cardiovascular: Denies palpitation, chest discomfort Gastrointestinal:  Denies nausea, heartburn or change in bowel habits Skin: Denies abnormal skin rashes Lymphatics: Denies new lymphadenopathy or  easy bruising Neurological neuropathy in the feet Behavioral/Psych: Mood is stable, no new changes  Extremities: No lower extremity edema  All other systems were reviewed with the patient and are negative.  I have reviewed the past medical history, past surgical history, social history and family history with the patient and they are unchanged from previous note.  ALLERGIES:  is allergic to other and penicillins.  MEDICATIONS:  Current Outpatient Prescriptions  Medication Sig Dispense Refill  . atorvastatin (LIPITOR) 80 MG tablet Take 80 mg by mouth every evening.     [name] Kitchen azelastine (ASTELIN) 0.1 % nasal spray Place 2 sprays into both nostrils daily as needed for rhinitis or allergies. Use in each nostril as directed    . gabapentin (NEURONTIN) 100 MG capsule Take 200 mg by mouth at bedtime.    [name] Kitchen levothyroxine (SYNTHROID, LEVOTHROID) 100 MCG tablet Take 100 mcg by mouth daily before breakfast.     . lisinopril (PRINIVIL,ZESTRIL) 20 MG tablet Take 20 mg by mouth every evening.     . magic mouthwash w/lidocaine SOLN 5-49m by mouth 4 times a day as needed. Swish, swallow, or spit 240 mL 1  . magnesium oxide (MAG-OX) 400 (241.3 Mg) MG tablet Take 1.5 tablets (600 mg total) by mouth daily. 30 tablet 1  . metFORMIN (GLUCOPHAGE-XR) 500 MG 24 hr tablet Take 1,000 mg by mouth daily with supper.     .[name] Kitchenomeprazole (PRILOSEC) 40 MG capsule Take 1 capsule (40 mg total) by mouth daily. 30 capsule 2  . Vitamin D, Ergocalciferol, (DRISDOL) 50000 units CAPS capsule Take 50,000 Units by mouth every 7 (seven) days. Saturdays     No current  facility-administered medications for this visit.    Facility-Administered Medications Ordered in Other Visits  Medication Dose Route Frequency Provider Last Rate Last Dose  . heparin lock flush 100 unit/mL  500 Units Intravenous Once PRN Nicholas Lose, MD      . sodium chloride flush (NS) 0.9 % injection 10 mL  10 mL Intravenous PRN Nicholas Lose, MD   10 mL at 07/11/16  1141    PHYSICAL EXAMINATION: ECOG PERFORMANCE STATUS: 1 - Symptomatic but completely ambulatory  Vitals:   07/11/16 1149  BP: (!) 155/70  Pulse: 62  Resp: 18  Temp: 97.8 F (36.6 C)   Filed Weights   07/11/16 1149  Weight: 178 lb 6.4 oz (80.9 kg)    GENERAL:alert, no distress and comfortable SKIN: skin color, texture, turgor are normal, no rashes or significant lesions EYES: normal, Conjunctiva are pink and non-injected, sclera clear OROPHARYNX:no exudate, no erythema and lips, buccal mucosa, and tongue normal  NECK: supple, thyroid normal size, non-tender, without nodularity LYMPH:  no palpable lymphadenopathy in the cervical, axillary or inguinal LUNGS: clear to auscultation and percussion with normal breathing effort HEART: regular rate & rhythm and no murmurs and no lower extremity edema ABDOMEN:abdomen soft, non-tender and normal bowel sounds MUSCULOSKELETAL:no cyanosis of digits and no clubbing  NEURO: alert & oriented x 3 with fluent speech, no focal motor/sensory deficits EXTREMITIES: No lower extremity edema  LABORATORY DATA:  I have reviewed the data as listed   Chemistry      Component Value Date/Time   NA 140 07/11/2016 1100   K 4.1 07/11/2016 1100   CL 103 03/16/2016 0506   CO2 25 07/11/2016 1100   BUN 14.9 07/11/2016 1100   CREATININE 0.8 07/11/2016 1100      Component Value Date/Time   CALCIUM 9.4 07/11/2016 1100   ALKPHOS 88 07/11/2016 1100   AST 28 07/11/2016 1100   ALT 31 07/11/2016 1100   BILITOT 0.86 07/11/2016 1100       Lab Results  Component Value Date   WBC 2.5 (L) 07/11/2016   HGB 11.0 (L) 07/11/2016   HCT 32.5 (L) 07/11/2016   MCV 99.8 07/11/2016   PLT 167 07/11/2016   NEUTROABS 1.3 (L) 07/11/2016    ASSESSMENT & PLAN:  Breast cancer of upper-inner quadrant of right female breast ([place]) 02/29/2016: Right breast palpable mass (with silicone implants [id number]), 3.5 cm on MRI, additional 3 cm anterior linear enhancement (biopsy  03/14/2016 IDC grade 3); 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  T2 N0 stage 2A clinical stage  Recommendation: 1. Neoadjuvant chemotherapy with dose dense Adriamycin and Cytoxan 4 followed by Abraxane weekly 12 (patient is diabetic and cannot takesteroids) 2. followed by breast conserving surgery (with plastic surgery help regarding implant rupture) 3. Followed by radiation therapy ----------------------------------------------------------------------------------------------------------------------------------------- Current treatment: Completed 4 cycles of dose dense Adriamycin and Cytoxan, today is cycle 6Abraxane (chemotherapy was held for fatigue issues) Echocardiogram 03/25/2016: EF 60-65% Labs reviewed  Chemotherapy toxicities: 1. Grade 3 neutropenia: with AC 2. grade 2 nausea:Improved 3. Fatigue: Due to chemotherapy  4.Severe constipation:improved with stool softeners Closely monitoring for chemotherapy toxicities.  Diabetes mellitus: I instructed her about strict glycemic control.  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 I am concerned about her continuation of therapy. After much discussion we elected to discontinue all further chemotherapy.  Mildly elevated AST and ALT: being observed We will obtain a breast MRI this Friday and see her next Friday to discuss the results.  We would like to obtain a breast MRI followed by an appointment with general surgery to see me. Patient is extremely busy with the tax season.   I spent 25 minutes talking to the patient of which more than half was spent in counseling and coordination of care.  Orders Placed This Encounter  Procedures  . MR BREAST BILATERAL W WO CONTRAST    Standing Status:   Future    Standing Expiration Date:   09/08/2017    Order Specific Question:   If indicated for the ordered procedure, I authorize the administration of contrast media per Radiology protocol    Answer:    Yes    Order Specific Question:   Reason for Exam (SYMPTOM  OR DIAGNOSIS REQUIRED)    Answer:   Post neo adjuvant chemo reassessment    Order Specific Question:   Preferred imaging location?    Answer:   [facility] (table limit-550lbs)    Order Specific Question:   What is the patient's sedation requirement?    Answer:   No Sedation    Order Specific Question:   Does the patient have a pacemaker or implanted devices?    Answer:   No   The patient has a good understanding of the overall plan. she agrees with it. she will call with any problems that may develop before the next visit here.   Rulon Eisenmenger, MD 07/11/16

## 2016-07-14 NOTE — Progress Notes (Signed)
[facility] Counseling Note  Called pt to inquire about rescheduling canceled appt on 2/1. Pt agreed that she wanted to reschedule; first appt at [facility] on 2/9.  Ayesha Rumpf, [place] LPCA [place] Voicemail: [phone]

## 2016-07-15 ENCOUNTER — Ambulatory Visit (HOSPITAL_COMMUNITY)
Admission: RE | Admit: 2016-07-15 | Discharge: 2016-07-15 | Disposition: A | Discharge status: Home / Self Care | Payer: Medicare Other | Source: Ambulatory Visit | Attending: Hematology and Oncology | Admitting: Hematology and Oncology

## 2016-07-15 DIAGNOSIS — Z171 Estrogen receptor negative status [ER-]: Secondary | ICD-10-CM | POA: Insufficient documentation

## 2016-07-15 DIAGNOSIS — C50211 Malignant neoplasm of upper-inner quadrant of right female breast: Secondary | ICD-10-CM | POA: Insufficient documentation

## 2016-07-15 DIAGNOSIS — C50911 Malignant neoplasm of unspecified site of right female breast: Secondary | ICD-10-CM | POA: Diagnosis not present

## 2016-07-15 MED ORDER — GADOBENATE DIMEGLUMINE 529 MG/ML IV SOLN
20.0000 mL | Freq: Once | INTRAVENOUS | Status: AC | PRN
  Administered 2016-07-15: 17 mL via INTRAVENOUS

## 2016-07-15 NOTE — Progress Notes (Signed)
[facility] Counseling Note  Pt Debroha Salik participated in an individual counseling session at [facility] on 07/15/2016. Pt was engaged and thoughtful throughout session.   Presenting Concern: Impending decisions following cancer tx, including moving to be closer to her family -- which would mean leaving her job and starting over in a new place. Pt would also like to develop meaningful relationships and connections, but is not sure how to do so.  Session Notes and Counselor Assessment: Counselor opened session with intake paperwork, including counselor PDS, consent to record, and intake interview. Pt gave a mental health history, disclosing two mental health diagnoses that she was given 50 years ago. Pt does not believe that these diagnoses are accurate and stopped tx for these diagnoses "several years ago." Counselor will continue to assess for mental health diagnoses that could affect pt's behavior, but does not have any concerns at this time. Counselor checked on pt's safety: pt reported no current Si, intent, or plan. Pt gave counselor a brief history of familial relationships, which counselor and pt will continue to explore as it relates to her presenting concerns and goals.   Treatment Plan and Goals: complete intake form; explore pt's dilemma utilizing MI techniques; utilize Attachment Theory and EFIT to explore pt's barriers to forming meaningful connections; help pt gain tools to build meaningful relationships; and psychoed on [facility]'s opportunities for connection.   Ayesha Rumpf, [place] LP[facility] [place] Voicemail: [phone] Supervisor: Lorrin Jackson, MDIV [facility]

## 2016-07-18 ENCOUNTER — Other Ambulatory Visit: Payer: Medicare Other

## 2016-07-18 ENCOUNTER — Ambulatory Visit: Payer: Medicare Other | Admitting: Hematology and Oncology

## 2016-07-18 ENCOUNTER — Ambulatory Visit: Payer: Medicare Other

## 2016-07-20 ENCOUNTER — Ambulatory Visit (HOSPITAL_BASED_OUTPATIENT_CLINIC_OR_DEPARTMENT_OTHER): Payer: Medicare Other | Admitting: Hematology and Oncology

## 2016-07-20 DIAGNOSIS — C50211 Malignant neoplasm of upper-inner quadrant of right female breast: Secondary | ICD-10-CM | POA: Diagnosis not present

## 2016-07-20 DIAGNOSIS — Z171 Estrogen receptor negative status [ER-]: Secondary | ICD-10-CM

## 2016-07-20 DIAGNOSIS — E119 Type 2 diabetes mellitus without complications: Secondary | ICD-10-CM

## 2016-07-20 NOTE — Progress Notes (Signed)
Patient Care Team: Estill Bamberg. [name], MD as PCP - General (Internal Medicine) Alphonsa Overall, MD as Consulting Physician (General Surgery) Nicholas Lose, MD as Consulting Physician (Hematology and Oncology) Kyung Rudd, MD as Consulting Physician (Radiation Oncology)  DIAGNOSIS:  Encounter Diagnosis  Name Primary?  . Malignant neoplasm of upper-inner quadrant of right breast in female, estrogen receptor negative ([place])     SUMMARY OF ONCOLOGIC HISTORY:   Breast cancer of upper-inner quadrant of right female breast ([place])   02/29/2016 Initial Diagnosis    Right breast palpable mass (with silicone implants 8182), 3.5 cm on MRI, additional 3 cm anterior linear enhancement? 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 Ki-67 60%, T2 N0 stage 2A clinical stage      03/14/2016 Procedure    Right breast biopsy upper inner quadrant: IDC grade 3      03/28/2016 -  Neo-Adjuvant Chemotherapy    Neoadjuvant chemotherapy with dose dense Adriamycin and Cytoxan followed by Abraxane weekly 5 ( patient is diabetic and cannot take steroids)      07/15/2016 Breast MRI    Right breast: Spiculated mass 1.5 cm significantly smaller compared to prior,NME previously seen is not noted, no abnormal lymph nodes       CHIEF COMPLIANT: Follow-up to review the breast MRI  INTERVAL HISTORY: Marissa Hall is a 76 year old with above-mentioned history of right breast cancer who underwent neoadjuvant chemotherapy. She could only finished 5 cycles of Abraxane. She had a breast MRI and is here today to discuss results. She was presented this morning of the multidisciplinary tumor board.  REVIEW OF SYSTEMS:   Constitutional: Denies fevers, chills or abnormal weight loss Eyes: Denies blurriness of vision Ears, nose, mouth, throat, and face: Denies mucositis or sore throat Respiratory: Denies cough, dyspnea or wheezes Cardiovascular: Denies palpitation, chest discomfort Gastrointestinal:  Denies nausea,  heartburn or change in bowel habits Skin: Denies abnormal skin rashes Lymphatics: Denies new lymphadenopathy or easy bruising Neurological: Neuropathy in the feet is improving Behavioral/Psych: Mood is stable, no new changes  Extremities: No lower extremity edema Breast:  denies any pain or lumps or nodules in either breasts All other systems were reviewed with the patient and are negative.  I have reviewed the past medical history, past surgical history, social history and family history with the patient and they are unchanged from previous note.  ALLERGIES:  is allergic to other and penicillins.  MEDICATIONS:  Current Outpatient Prescriptions  Medication Sig Dispense Refill  . atorvastatin (LIPITOR) 80 MG tablet Take 80 mg by mouth every evening.     [name] Kitchen azelastine (ASTELIN) 0.1 % nasal spray Place 2 sprays into both nostrils daily as needed for rhinitis or allergies. Use in each nostril as directed    . gabapentin (NEURONTIN) 100 MG capsule Take 200 mg by mouth at bedtime.    [name] Kitchen levothyroxine (SYNTHROID, LEVOTHROID) 100 MCG tablet Take 100 mcg by mouth daily before breakfast.     . lisinopril (PRINIVIL,ZESTRIL) 20 MG tablet Take 20 mg by mouth every evening.     . magic mouthwash w/lidocaine SOLN 5-35m by mouth 4 times a day as needed. Swish, swallow, or spit 240 mL 1  . magnesium oxide (MAG-OX) 400 (241.3 Mg) MG tablet Take 1.5 tablets (600 mg total) by mouth daily. 30 tablet 1  . metFORMIN (GLUCOPHAGE-XR) 500 MG 24 hr tablet Take 1,000 mg by mouth daily with supper.     .[name] Kitchenomeprazole (PRILOSEC) 40 MG capsule Take 1 capsule (40 mg total)  by mouth daily. 30 capsule 2  . Vitamin D, Ergocalciferol, (DRISDOL) 50000 units CAPS capsule Take 50,000 Units by mouth every 7 (seven) days. Saturdays     No current facility-administered medications for this visit.     PHYSICAL EXAMINATION: ECOG PERFORMANCE STATUS: 1 - Symptomatic but completely ambulatory  Vitals:   07/20/16 1022  BP: (!)  146/60  Pulse: 70  Resp: 18  Temp: 97.5 F (36.4 C)   Filed Weights   07/20/16 1022  Weight: 177 lb (80.3 kg)    GENERAL:alert, no distress and comfortable SKIN: skin color, texture, turgor are normal, no rashes or significant lesions EYES: normal, Conjunctiva are pink and non-injected, sclera clear OROPHARYNX:no exudate, no erythema and lips, buccal mucosa, and tongue normal  NECK: supple, thyroid normal size, non-tender, without nodularity LYMPH:  no palpable lymphadenopathy in the cervical, axillary or inguinal LUNGS: clear to auscultation and percussion with normal breathing effort HEART: regular rate & rhythm and no murmurs and no lower extremity edema ABDOMEN:abdomen soft, non-tender and normal bowel sounds MUSCULOSKELETAL:no cyanosis of digits and no clubbing  NEURO: alert & oriented x 3 with fluent speech, peripheral neuropathy in the feet EXTREMITIES: No lower extremity edema   LABORATORY DATA:  I have reviewed the data as listed   Chemistry      Component Value Date/Time   NA 140 07/11/2016 1100   K 4.1 07/11/2016 1100   CL 103 03/16/2016 0506   CO2 25 07/11/2016 1100   BUN 14.9 07/11/2016 1100   CREATININE 0.8 07/11/2016 1100      Component Value Date/Time   CALCIUM 9.4 07/11/2016 1100   ALKPHOS 88 07/11/2016 1100   AST 28 07/11/2016 1100   ALT 31 07/11/2016 1100   BILITOT 0.86 07/11/2016 1100       Lab Results  Component Value Date   WBC 2.5 (L) 07/11/2016   HGB 11.0 (L) 07/11/2016   HCT 32.5 (L) 07/11/2016   MCV 99.8 07/11/2016   PLT 167 07/11/2016   NEUTROABS 1.3 (L) 07/11/2016    ASSESSMENT & PLAN:  Breast cancer of upper-inner quadrant of right female breast ([place]) 02/29/2016: Right breast palpable mass (with silicone implants 5670), 3.5 cm on MRI, additional 3 cm anterior linear enhancement (biopsy 03/14/2016 IDC grade 3); 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  T2 N0 stage 2A clinical stage  Recommendation: 1. Neoadjuvant  chemotherapy with dose dense Adriamycin and Cytoxan 4 followed by Abraxane weekly 12 (patient is diabetic and cannot takesteroids) 2. followed by breast conserving surgery (with plastic surgery help regarding implant rupture) 3. Followed by radiation therapy ----------------------------------------------------------------------------------------------------------------------------------------- Current treatment: Completed 4 cycles of dose dense Adriamycin and Cytoxan, completed 5 cycles ofAbraxane 06/27/2016 (chemotherapy stopped for fatigue issues) Echocardiogram 03/25/2016: EF 60-65% Labs reviewed  Diabetes mellitus: I instructed her about strict glycemic control.  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 Now onNeurontin at bedtime 300 mg.  Patient is under extraordinary amount of stress from her work.  Mildly elevated AST and ALT: being observed Post neoadjuvant breast MRI 07/15/2016: Right breast: Spiculated mass 1.5 cm significantly smaller compared to prior,NME previously seen is not noted, no abnormal lymph nodes  Breast MRI review: I discussed the breast MRI results with the patient great detail by reviewing the previous MRI and comparing it to the current. I discussed at tumor board recommendation. Patient would likely undergo breast conserving surgery along with plastic surgery assistance because there is an implant rupture.  Return to clinic one  week after surgery to discuss the pathology report.  I spent 25 minutes talking to the patient of which more than half was spent in counseling and coordination of care.  No orders of the defined types were placed in this encounter.  The patient has a good understanding of the overall plan. she agrees with it. she will call with any problems that may develop before the next visit here.   Rulon Eisenmenger, MD 07/20/16

## 2016-07-20 NOTE — Assessment & Plan Note (Addendum)
02/29/2016: Right breast palpable mass (with silicone implants 5320), 3.5 cm on MRI, additional 3 cm anterior linear enhancement (biopsy 03/14/2016 IDC grade 3); 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  T2 N0 stage 2A clinical stage  Recommendation: 1. Neoadjuvant chemotherapy with dose dense Adriamycin and Cytoxan 4 followed by Abraxane weekly 12 (patient is diabetic and cannot takesteroids) 2. followed by breast conserving surgery (with plastic surgery help regarding implant rupture) 3. Followed by radiation therapy ----------------------------------------------------------------------------------------------------------------------------------------- Current treatment: Completed 4 cycles of dose dense Adriamycin and Cytoxan, completed 5 cycles ofAbraxane 06/27/2016 (chemotherapy stopped for fatigue issues) Echocardiogram 03/25/2016: EF 60-65% Labs reviewed  Diabetes mellitus: I instructed her about strict glycemic control.  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 Now onNeurontin at bedtime 300 mg.  Patient is under extraordinary amount of stress from her work.  Mildly elevated AST and ALT: being observed Post neoadjuvant breast MRI 07/15/2016: Right breast: Spiculated mass 1.5 cm significantly smaller compared to prior,NME previously seen is not noted, no abnormal lymph nodes  Breast MRI review: I discussed the breast MRI results with the patient great detail by reviewing the previous MRI and comparing it to the current. I discussed at tumor board recommendation. Patient would likely undergo breast conserving surgery along with plastic surgery assistance because there is an implant rupture.  Return to clinic one week after surgery to discuss the pathology report.

## 2016-07-22 NOTE — Progress Notes (Signed)
[facility] Counseling Note  Called client to reschedule appointment: next session will be Thursday 3/1 at 3p.  Ayesha Rumpf, [place] [facility] [phone]

## 2016-08-04 DIAGNOSIS — C50911 Malignant neoplasm of unspecified site of right female breast: Secondary | ICD-10-CM | POA: Diagnosis not present

## 2016-08-09 NOTE — Progress Notes (Signed)
[facility] Counseling Note  Called pt to f/u after a missed appointment on 3/1 and to gauge interest in rescheduling. Pt reported that her brother died suddenly and she is currently in [place] for the funeral. She appreciated call and apologized for missing the scheduled appointment. Counselor used active listening and reflections of feeling to validate patient's experiences. Patient will call back later this week (3/6) to reschedule appointment.  Westly Pam, [place] [facility] [phone]

## 2016-08-15 ENCOUNTER — Ambulatory Visit: Payer: Medicare Other

## 2016-08-17 DIAGNOSIS — Z171 Estrogen receptor negative status [ER-]: Secondary | ICD-10-CM | POA: Diagnosis not present

## 2016-08-17 DIAGNOSIS — T8543XA Leakage of breast prosthesis and implant, initial encounter: Secondary | ICD-10-CM | POA: Diagnosis not present

## 2016-08-17 DIAGNOSIS — C50211 Malignant neoplasm of upper-inner quadrant of right female breast: Secondary | ICD-10-CM | POA: Diagnosis not present

## 2016-08-19 DIAGNOSIS — E1140 Type 2 diabetes mellitus with diabetic neuropathy, unspecified: Secondary | ICD-10-CM | POA: Diagnosis not present

## 2016-08-19 DIAGNOSIS — E785 Hyperlipidemia, unspecified: Secondary | ICD-10-CM | POA: Diagnosis not present

## 2016-08-19 DIAGNOSIS — E1165 Type 2 diabetes mellitus with hyperglycemia: Secondary | ICD-10-CM | POA: Diagnosis not present

## 2016-08-19 DIAGNOSIS — E039 Hypothyroidism, unspecified: Secondary | ICD-10-CM | POA: Diagnosis not present

## 2016-08-19 DIAGNOSIS — E1169 Type 2 diabetes mellitus with other specified complication: Secondary | ICD-10-CM | POA: Diagnosis not present

## 2016-08-19 NOTE — Progress Notes (Signed)
[facility] Counseling Note  Called pt to check in on her emotional wellbeing following her brother's death. Counselor LVM.Will f/u next week if no returned message.  Marissa Hall, [place] [facility] [place]

## 2016-08-23 ENCOUNTER — Other Ambulatory Visit: Payer: Self-pay | Admitting: Surgery

## 2016-08-23 DIAGNOSIS — E1143 Type 2 diabetes mellitus with diabetic autonomic (poly)neuropathy: Secondary | ICD-10-CM | POA: Diagnosis not present

## 2016-08-23 DIAGNOSIS — E785 Hyperlipidemia, unspecified: Secondary | ICD-10-CM | POA: Diagnosis not present

## 2016-08-23 DIAGNOSIS — E1159 Type 2 diabetes mellitus with other circulatory complications: Secondary | ICD-10-CM | POA: Diagnosis not present

## 2016-08-23 DIAGNOSIS — E039 Hypothyroidism, unspecified: Secondary | ICD-10-CM | POA: Insufficient documentation

## 2016-08-23 DIAGNOSIS — I10 Essential (primary) hypertension: Secondary | ICD-10-CM | POA: Diagnosis not present

## 2016-08-23 DIAGNOSIS — E1169 Type 2 diabetes mellitus with other specified complication: Secondary | ICD-10-CM | POA: Diagnosis not present

## 2016-08-23 DIAGNOSIS — I152 Hypertension secondary to endocrine disorders: Secondary | ICD-10-CM | POA: Insufficient documentation

## 2016-08-23 DIAGNOSIS — E119 Type 2 diabetes mellitus without complications: Secondary | ICD-10-CM | POA: Insufficient documentation

## 2016-08-23 DIAGNOSIS — C50911 Malignant neoplasm of unspecified site of right female breast: Secondary | ICD-10-CM

## 2016-08-25 ENCOUNTER — Encounter: Payer: Self-pay | Admitting: Radiation Oncology

## 2016-08-25 NOTE — Progress Notes (Signed)
[facility] Counseling Note  Called pt to check in following brother's death and see if pt wanted to schedule another appointment. Pt reported having a "rough week" and a "melt down" on Monday. Counselor provided emotional support through reflections of feeling and open questions. Pt scheduled an appointment for 3/23 at 4p.  Westly Pam, [place] [facility] [place]

## 2016-08-25 NOTE — H&P (Signed)
Subjective:     Patient ID: Marissa Hall is a 76 y.o. female.  HPI  Patient of Drs. Vincente Liberty here for consultation breast reconstruction. Presented with palpable mass right breast. MMG/US revealed a 3.2 cm mass in 1 to 2:00 position and an additional 6 mm lesion at 12:00 position , latter on biopsy was fibrocystic change. Biopsy of palpable mass with triple negative grade 3 IDC. MRI revealed a 3.5 cm lesion with linear enhancement measuring 3 cm, total span was 6.4 cm. She underwent neoadjuvant chemotherapy but this was stopped after 5 cycles due to intolerance. Post treatment MRI with 1.5 cm mass present, resolution enhancement. Patient desires lumpectomy. She lives alone, has family that would come for surgery. 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   History augmentation 5681 silicone subglandular . Intracapsular rupture right noted on initial MRI on right.  Current 44 D. Prior to augmentation- does not recall size. Has no implant information. Surgery done in [place].  PMH significant for DM. Last HbA1c 7.0 TIA noted in review of chart. PMH aslo reports bipolar d/o- states she has this diagnosis made in past but feels she never been manic, does have depression, no medications for this.     Objective:   Physical Exam  Constitutional: She is oriented to person, place, and time.  Cardiovascular: Normal rate, regular rhythm and normal heart sounds.   Pulmonary/Chest: Effort normal and breath sounds normal.  Lymphadenopathy:    She has no axillary adenopathy.  Neurological: She is alert and oriented to person, place, and time.    Baker 1 on right, 2 on left Grade 2 ptosis bilateral with breast tissue inferior to implant Nodular mass right UIQ SN to nipple R 29 L 29 cm BW R 20 L 22 cm Nipple to IMF R 9  L 10 cm bilat IMF scars 5 cm soft tissue pinch over implants    Assessment:     Right breast ca UIQ triple negative Hx subglandular augmentation mammaplasty with right  intracapsular rupture    Plan:     MRI personally reviewed and right intracapsular rupture noted, subglandular position. Recommend removal implant as ruptured as will likely develop contracture and if extracapsular progression will form multiple masses and be difficult to follow from cancer surveillance. Recommend this be done prior to XRT as will be higher risk wound healing problems, fat necrosis post XRT. Given age of opposite implant, possible rupture and/or significant gel bleed present. Reviewed anticipated XRT will place her high risk capsular contracture. Subglandular position also with higher incidence capsular contracture. She also has grade 2 ptosis bilateral with breast tissue off surface of implants. Reviewed options replace or remove implants. If she replaced I would recommend switch to dual plane position, though XRT will still confer significant risk contracture.  Plan removal alone of right ruptured implant with capsulectomy on to aid with removal implant and implant material. She has elected to defer any surgery on left at this time.  Irene Limbo, MD [facility] [phone], pin [phone]

## 2016-08-26 ENCOUNTER — Telehealth: Payer: Self-pay | Admitting: Hematology and Oncology

## 2016-08-26 NOTE — Telephone Encounter (Signed)
sw pt to confirm 4/17 appt at 0900 per LOS

## 2016-09-01 NOTE — Progress Notes (Signed)
[facility] Counseling Note  Second individual session with client. Client and counselor discussed recent and upcoming events in client's life, as well as discussed her current coping strategies. Counselor will follow up with client during next appointment on these coping strategies and see if new strategies were implemented. Next appointment: 4/6 at 2:30p.    Westly Pam [phone]

## 2016-09-06 ENCOUNTER — Other Ambulatory Visit: Payer: Self-pay

## 2016-09-06 ENCOUNTER — Encounter (HOSPITAL_BASED_OUTPATIENT_CLINIC_OR_DEPARTMENT_OTHER): Payer: Self-pay | Admitting: *Deleted

## 2016-09-06 MED ORDER — MAGNESIUM OXIDE 400 (241.3 MG) MG PO TABS: 600.0000 mg | ORAL_TABLET | Freq: Every day | ORAL | 3 refills | Status: DC

## 2016-09-07 ENCOUNTER — Encounter (HOSPITAL_BASED_OUTPATIENT_CLINIC_OR_DEPARTMENT_OTHER)
Admission: RE | Admit: 2016-09-07 | Discharge: 2016-09-07 | Disposition: A | Discharge status: Home / Self Care | Payer: Medicare Other | Source: Ambulatory Visit | Attending: Surgery | Admitting: Surgery

## 2016-09-07 DIAGNOSIS — Z01818 Encounter for other preprocedural examination: Secondary | ICD-10-CM | POA: Diagnosis not present

## 2016-09-07 LAB — BASIC METABOLIC PANEL
ANION GAP: 8 (ref 5–15)
BUN: 12 mg/dL (ref 6–20)
CO2: 29 mmol/L (ref 22–32)
Calcium: 9.6 mg/dL (ref 8.9–10.3)
Chloride: 102 mmol/L (ref 101–111)
Creatinine, Ser: 0.81 mg/dL (ref 0.44–1.00)
Glucose, Bld: 157 mg/dL — ABNORMAL HIGH (ref 65–99)
POTASSIUM: 4.4 mmol/L (ref 3.5–5.1)
SODIUM: 139 mmol/L (ref 135–145)

## 2016-09-09 ENCOUNTER — Other Ambulatory Visit: Payer: Self-pay | Admitting: Surgery

## 2016-09-09 DIAGNOSIS — C50911 Malignant neoplasm of unspecified site of right female breast: Secondary | ICD-10-CM

## 2016-09-12 DIAGNOSIS — C50211 Malignant neoplasm of upper-inner quadrant of right female breast: Secondary | ICD-10-CM | POA: Diagnosis not present

## 2016-09-12 NOTE — H&P (Signed)
Marissa Hall  Location: [facility] Surgery Patient #: [id number] DOB: 10/31/1940 Undefined / Language: Cleophus Molt / Race: White Female  History of Present Illness   The patient is a 76 year old female who presents with a complaint of breast cancer.   The PCP is Dr. Hale Bogus [facility])  The patient was referred by Dr. Andris Baumann.  She is at the Breast [facility] - Drs. Gudena/Moody.  Sees Dr. Meyer Russel, endocrinology, J[facility]. She comes by herself. There has been some delay of her surgery because her brother just died in [place].  She is here to talk about her surgery. She is very stressed her older brother just died in [place]. Her ex husband is terminal - he controls what she gets from [employer] ... and has not share with her any information. So she is very concerned about money and cost of any surgery. Did I mention she is stressed? We talked options - she is sure that for now, she wants nothing done to the left breast. On the right side - she needs the lumpecotmy. And because the tumor is so close to the capsule of the implant - she needs part of the capsule excised. And since the implant is ruptured, the implant needs to come out. At this time, she is not interested in reconstruction and would just like the implant out. I have spoken to Dr. Iran Planas about this. I have spoken to Dr. Gwenevere Ghazi at the breast center about how to handle the UIQ cancer. She has two areas biopsied about 3.5 cm apart. He thinks that this can be taken care of with 2 seeds (there is a recommendation that the seeds be at least 2 cm apart) I spoke to Dr. Lindi Adie about power port, but he wants to leave it until he sees her residual disease (I left her phone message to this effect).  History of right breast cancer (02/2016): She has a history of bilateral silicone implants in 0300 in [place], [place]. She has not had a mammogram in 10  years, because she said that for her last mammogram, the techs put her in a vise and left for lunch (this was her preception). She tripped about 3 months ago, fell into the doorway, had a large black and blue bruise of the right breast/chest. Around that time, she felt a lump in her right breast. Then a little while after the fall, she felt a serring pain in ther right breast. She wonders if that is related to her right breast rupturing. She had her last period in 1972 - she had a hysterectomy at that time for benign reasons. There is no family history of breast cancer. She is not on hormones. She had a mammogram on 02/25/2016 which showed a 3 cm irregular mass wtih a spiculated margin She had two biopsies of her right breast on 02/29/2016 [phone]) which showed at 1 o'clock grade 3 IDC, triple negative, Ki67-60%. The biopsy at 11:30 o'clock showed fibrocystic changes.  [for some reason, I cannot see this report in Epic?]] She had an MRI on 03/04/2016 which showed the 3.5 cm known malignancy, but there is also a 3 cm linear enhancement extending anteriorly. The total disease is 6.4 cm. She also has an implant rupture on the right, but not on the left. She had a second biopsy on the right on 03/14/2016 ([id number])- this showed grade 3 IDC. This biopsy is about 4 cm anterior to the previous biopsy in the UIQ of the  right breast (so these biopsies are in the same quadrant) She had neo-adjuvant chemotx with adriamycin/cytoxan followed by Abraxane (03/28/2016 - 07/08/2016). She had some neuropathy and it looks like Dr. Lindi Adie stopped the treatment early.  Her follow up MRI on 07/15/2016 at 1.5 cm enhancing mass in the UIQ of the right breast. This represents an approximate 50% improvement in the cancer. She was presented at breast cancer conf on 2/14 (which I was absent): Donne Hazel wrote: Question of implant rupture on MRI. Probably needs to  see plastics if not yet. Looks like myght be lump/sn candidate even with other mr biopsy. Apparently that is what she is interested in.  I discussed the options for breast cancer treatment with the patient. She is at the [facility], which includes medical oncology and radiation oncology. I discussed the surgical options of lumpectomy vs. mastectomy. If mastectomy, there is the possibility of reconstruction. I discussed the options of lymph node biopsy. The treatment plan depends on the pathologic staging of the tumor and the patient's personal wishes. The risks of surgery include, but are not limited to, bleeding, infection, the need for further surgery, and nerve injury.  Plan: 1) MRI shows that she has had a good response to the chemotx. (on the most recent MRI - there appears to be a plane between the cancer and the capsule of the implant) (I reviewed the mammograms with Dr. Gwenevere Ghazi - I put a drawing in [place]), 2) Plan right breast lumpectomy (seed loc) (there were two biopsies which show cancer about 4 cm apart) and right axillary sentinel lymph node biopsy, 3) See plastic surgery as to how to handle her right breast implant (Dr. Iran Planas)  From Dr. Iran Planas - 08/17/2016: Met with this woman and final plan is to remove bilateral implants. I will need to do complete capsulectomy on right and possible on left. I am not sure if insurance will cover this as was cosmetic augmentation- will try to sort that out ASAP. However no matter I recommend she remove right implant prior to radiation. ... Arnoldo Hooker  Past Medical History: 1. Anxiety 2. Bipolar disorder 3. DM - Sees Dr. Geoffery Lyons, endocrinology, [facility]. Since 2008. She lost weight and came off meds for a while, but when she regained the weight and her DM is back. But she saw Dr. Hartford Poli this morning and he gave her a good report. 4. HTN since 2012 She had a tachy salvo condition in 2011 and saw a  cardiologyst, she has not had any further problems since then 5. hypothyroid 6. Hysterectomy - 1972 7. Left subclavian power port - 03/15/2016 - D. Lucia Gaskins  Social History: Divorced. Came by herself. Has one son who lives near [place], [place]. 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  Her brother died in [place] Sep 18, 2016). Her son is going to [place] in late April ... she was hoping for the surgery before that.   Allergies Bary Castilla [place], [place]; 08/23/2016 2:40 PM) Penicillins   Medication History Nance Pear, [place]; 08/23/2016 2:40 PM) FLUoxetine HCl (10MG Tablet, Oral) Active. Ibuprofen (200MG Tablet, Oral) Active. LORazepam (0.5MG Tablet, Oral) Active. MetFORMIN HCl (500MG Tablet, Oral) Active. Levothyroxine Sodium (100MCG Tablet, Oral) Active. Lisinopril (20MG Tablet, Oral) Active. Accu-Chek Aviva Plus (w/Device Kit,) Active. Atorvastatin Calcium (80MG Tablet, Oral) Active. Vitamin D (Ergocalciferol) (50000UNIT Capsule, Oral) Active. MetFORMIN HCl ER (500MG Tablet ER 24HR, Oral daily) Active. Gabapentin (100MG Capsule, Oral daily) Active. Medications Reconciled  Vitals Bary Castilla [place] CMA; 08/23/2016  2:41 PM) 08/23/2016 2:40 PM Weight: 177.4 lb Height: 69in Body Surface Area: 1.96 m Body Mass Index: 26.2 kg/m  Temp.: 98.71F  Pulse: 71 (Regular)  BP: 132/82 (Sitting, Left Arm, Standard)   Physical Exam  Note:General: Older WN WF alert and generally healthy appearing. Physical Exam: Eyes: Conjunctivae white, pupils equal. Face, ears, nose, mouth, and throat: Face - normal. Normal ears and nose. Lips and teeth normal.  Neck: Supple. No mass. Trachea midline. No thyroid mass.  Lymph Nodes: No supraclavicular or cervical adenopathy. No axillary adenopathy.  Breasts: right - The implants are firm. The mass that I felt before in the UIQ is essentially gone. I may feel some residual mass. Left - The  implants are firm. I feel no mass. Port in [place] of left breast.  Lungs: Normal respiratory effort. Clear to auscultation and symmetric breath sounds. Cardiovascular: Regular rate and rythm. Normal auscultation of the heart. No murmur or rub. Normal carotid pulse.  Abdomen: Soft. No mass. Liver and spleen not palpable. Normal bowel sounds.   Musculoskeletal/extremities: Normal gait. Good strength and ROM in upper and lower extremities.  Assessment & Plan  1  BREAST CANCER, STAGE 2, RIGHT (C50.911)  Story: Biopsy of right breast on 02/29/2016 [phone]) which showed at 1 o'clock grade 3 IDC, triple negative, Ki67-60%.  Second biopsy of right breast - 03/14/2016 ([id number]) - IDC  Oncology - Gudena/Moody  Plan:  1) Plan right breast lumpectomy (seed loc) (there were two biopsies which show cancer about 4 cm apart) (I spoke to Dr. Gwenevere Ghazi who recommended 2 seeds) and right axillary sentinel lymph node biopsy  2) Removal of implant by Dr. Iran Planas.  3) Dr. Lindi Adie has recommended keeping the port for now.  4) Colletta Maryland is the scheduler for Dr. Lucia Gaskins   Right RSL lump with SLNBx surgery is scheduled for 09/13/2016  2.  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  This is being left alone.  3. Anxiety 4. Bipolar disorder 5. DM - Sees Dr. Geoffery Lyons, endocrinology, [facility]. Since 2008. She lost weight and came off meds for a while, but when she regained the weight and her DM is back. But she saw Dr. Hartford Poli this morning and he gave her a good report. 6. HTN since 2012 She had a tachy salvo condition in 2011 and saw a cardiologyst, she has not had any further problems since then 7. hypothyroid 8. Left subclavian power port - 03/15/2016 - D. Levada Schilling, MD, [facility] Surgery Pager:  [phone] Office phone:  [phone]

## 2016-09-13 ENCOUNTER — Encounter (HOSPITAL_BASED_OUTPATIENT_CLINIC_OR_DEPARTMENT_OTHER): Payer: Self-pay | Admitting: *Deleted

## 2016-09-13 ENCOUNTER — Ambulatory Visit (HOSPITAL_BASED_OUTPATIENT_CLINIC_OR_DEPARTMENT_OTHER)
Admission: RE | Admit: 2016-09-13 | Discharge: 2016-09-13 | Disposition: A | Discharge status: Home / Self Care | Payer: Medicare Other | Source: Ambulatory Visit | Attending: Surgery | Admitting: Surgery

## 2016-09-13 ENCOUNTER — Encounter (HOSPITAL_COMMUNITY)
Admission: RE | Admit: 2016-09-13 | Discharge: 2016-09-13 | Disposition: A | Discharge status: Home / Self Care | Payer: Medicare Other | Source: Ambulatory Visit | Attending: Surgery | Admitting: Surgery

## 2016-09-13 ENCOUNTER — Ambulatory Visit (HOSPITAL_BASED_OUTPATIENT_CLINIC_OR_DEPARTMENT_OTHER): Payer: Medicare Other | Admitting: Anesthesiology

## 2016-09-13 ENCOUNTER — Encounter (HOSPITAL_BASED_OUTPATIENT_CLINIC_OR_DEPARTMENT_OTHER)
Admission: RE | Disposition: A | Discharge status: Home / Self Care | Payer: Self-pay | Source: Ambulatory Visit | Attending: Surgery

## 2016-09-13 DIAGNOSIS — Z87891 Personal history of nicotine dependence: Secondary | ICD-10-CM | POA: Diagnosis not present

## 2016-09-13 DIAGNOSIS — G8918 Other acute postprocedural pain: Secondary | ICD-10-CM | POA: Diagnosis not present

## 2016-09-13 DIAGNOSIS — T8549XA Other mechanical complication of breast prosthesis and implant, initial encounter: Secondary | ICD-10-CM | POA: Diagnosis not present

## 2016-09-13 DIAGNOSIS — T85898A Other specified complication of other internal prosthetic devices, implants and grafts, initial encounter: Secondary | ICD-10-CM | POA: Diagnosis not present

## 2016-09-13 DIAGNOSIS — Y831 Surgical operation with implant of artificial internal device as the cause of abnormal reaction of the patient, or of later complication, without mention of misadventure at the time of the procedure: Secondary | ICD-10-CM | POA: Diagnosis not present

## 2016-09-13 DIAGNOSIS — F419 Anxiety disorder, unspecified: Secondary | ICD-10-CM | POA: Insufficient documentation

## 2016-09-13 DIAGNOSIS — Z171 Estrogen receptor negative status [ER-]: Secondary | ICD-10-CM | POA: Diagnosis not present

## 2016-09-13 DIAGNOSIS — K5900 Constipation, unspecified: Secondary | ICD-10-CM | POA: Diagnosis not present

## 2016-09-13 DIAGNOSIS — F319 Bipolar disorder, unspecified: Secondary | ICD-10-CM | POA: Insufficient documentation

## 2016-09-13 DIAGNOSIS — Z8673 Personal history of transient ischemic attack (TIA), and cerebral infarction without residual deficits: Secondary | ICD-10-CM | POA: Diagnosis not present

## 2016-09-13 DIAGNOSIS — Z9221 Personal history of antineoplastic chemotherapy: Secondary | ICD-10-CM | POA: Insufficient documentation

## 2016-09-13 DIAGNOSIS — E119 Type 2 diabetes mellitus without complications: Secondary | ICD-10-CM | POA: Insufficient documentation

## 2016-09-13 DIAGNOSIS — T8541XA Breakdown (mechanical) of breast prosthesis and implant, initial encounter: Secondary | ICD-10-CM | POA: Diagnosis not present

## 2016-09-13 DIAGNOSIS — Z7984 Long term (current) use of oral hypoglycemic drugs: Secondary | ICD-10-CM | POA: Insufficient documentation

## 2016-09-13 DIAGNOSIS — C50211 Malignant neoplasm of upper-inner quadrant of right female breast: Secondary | ICD-10-CM | POA: Diagnosis not present

## 2016-09-13 DIAGNOSIS — Z79899 Other long term (current) drug therapy: Secondary | ICD-10-CM | POA: Insufficient documentation

## 2016-09-13 DIAGNOSIS — Z853 Personal history of malignant neoplasm of breast: Secondary | ICD-10-CM | POA: Insufficient documentation

## 2016-09-13 DIAGNOSIS — E039 Hypothyroidism, unspecified: Secondary | ICD-10-CM | POA: Diagnosis not present

## 2016-09-13 DIAGNOSIS — K219 Gastro-esophageal reflux disease without esophagitis: Secondary | ICD-10-CM | POA: Diagnosis not present

## 2016-09-13 DIAGNOSIS — I10 Essential (primary) hypertension: Secondary | ICD-10-CM | POA: Diagnosis not present

## 2016-09-13 DIAGNOSIS — C50911 Malignant neoplasm of unspecified site of right female breast: Secondary | ICD-10-CM | POA: Diagnosis not present

## 2016-09-13 HISTORY — DX: Gastro-esophageal reflux disease without esophagitis: K21.9

## 2016-09-13 HISTORY — PX: BREAST IMPLANT REMOVAL: SHX5361

## 2016-09-13 HISTORY — PX: BREAST LUMPECTOMY WITH RADIOACTIVE SEED AND SENTINEL LYMPH NODE BIOPSY: SHX6550

## 2016-09-13 HISTORY — PX: CAPSULECTOMY: SHX5381

## 2016-09-13 LAB — GLUCOSE, CAPILLARY
GLUCOSE-CAPILLARY: 139 mg/dL — AB (ref 65–99)
Glucose-Capillary: 109 mg/dL — ABNORMAL HIGH (ref 65–99)

## 2016-09-13 LAB — POCT HEMOGLOBIN-HEMACUE: Hemoglobin: 13 g/dL (ref 12.0–15.0)

## 2016-09-13 SURGERY — BREAST LUMPECTOMY WITH RADIOACTIVE SEED AND SENTINEL LYMPH NODE BIOPSY
Anesthesia: General | Site: Breast | Laterality: Right

## 2016-09-13 MED ORDER — HYDROMORPHONE HCL 1 MG/ML IJ SOLN
INTRAMUSCULAR | Status: AC
  Filled 2016-09-13: qty 1

## 2016-09-13 MED ORDER — ONDANSETRON HCL 4 MG/2ML IJ SOLN
INTRAMUSCULAR | Status: AC
  Filled 2016-09-13: qty 2

## 2016-09-13 MED ORDER — OXYCODONE HCL 5 MG PO TABS: 5.0000 mg | ORAL_TABLET | Freq: Once | ORAL | Status: DC | PRN

## 2016-09-13 MED ORDER — TECHNETIUM TC 99M SULFUR COLLOID FILTERED
1.0000 | Freq: Once | INTRAVENOUS | Status: AC | PRN
  Administered 2016-09-13: 1 via INTRADERMAL

## 2016-09-13 MED ORDER — EPHEDRINE SULFATE 50 MG/ML IJ SOLN
INTRAMUSCULAR | Status: DC | PRN
  Administered 2016-09-13 (×2): 10 mg via INTRAVENOUS

## 2016-09-13 MED ORDER — ROCURONIUM BROMIDE 100 MG/10ML IV SOLN
INTRAVENOUS | Status: DC | PRN
Start: 2016-09-13 — End: 2016-09-13
  Administered 2016-09-13: 50 mg via INTRAVENOUS

## 2016-09-13 MED ORDER — MIDAZOLAM HCL 2 MG/2ML IJ SOLN
1.0000 mg | INTRAMUSCULAR | Status: DC | PRN
  Administered 2016-09-13 (×2): 1 mg via INTRAVENOUS

## 2016-09-13 MED ORDER — LIDOCAINE 2% (20 MG/ML) 5 ML SYRINGE
INTRAMUSCULAR | Status: DC | PRN
  Administered 2016-09-13: 100 mg via INTRAVENOUS

## 2016-09-13 MED ORDER — CEFAZOLIN SODIUM-DEXTROSE 2-4 GM/100ML-% IV SOLN
INTRAVENOUS | Status: AC
Start: 2016-09-13 — End: 2016-09-13
  Filled 2016-09-13: qty 100

## 2016-09-13 MED ORDER — ACETAMINOPHEN 500 MG PO TABS
1000.0000 mg | ORAL_TABLET | ORAL | Status: AC
  Administered 2016-09-13: 1000 mg via ORAL

## 2016-09-13 MED ORDER — SUGAMMADEX SODIUM 200 MG/2ML IV SOLN
INTRAVENOUS | Status: DC | PRN
  Administered 2016-09-13: 200 mg via INTRAVENOUS

## 2016-09-13 MED ORDER — OXYCODONE HCL 5 MG PO TABS: 5.0000 mg | ORAL_TABLET | Freq: Once | ORAL | 0 refills | Status: DC | PRN

## 2016-09-13 MED ORDER — METHYLENE BLUE 0.5 % INJ SOLN
INTRAVENOUS | Status: AC
  Filled 2016-09-13: qty 10

## 2016-09-13 MED ORDER — OXYCODONE HCL 5 MG/5ML PO SOLN
5.0000 mg | Freq: Once | ORAL | Status: DC | PRN
Start: 2016-09-13 — End: 2016-09-13

## 2016-09-13 MED ORDER — CHLORHEXIDINE GLUCONATE CLOTH 2 % EX PADS: 6.0000 | MEDICATED_PAD | Freq: Once | CUTANEOUS | Status: DC

## 2016-09-13 MED ORDER — FENTANYL CITRATE (PF) 100 MCG/2ML IJ SOLN
50.0000 ug | INTRAMUSCULAR | Status: AC | PRN
  Administered 2016-09-13: 25 ug via INTRAVENOUS
  Administered 2016-09-13 (×3): 50 ug via INTRAVENOUS
  Administered 2016-09-13: 25 ug via INTRAVENOUS
  Administered 2016-09-13 (×2): 50 ug via INTRAVENOUS

## 2016-09-13 MED ORDER — BUPIVACAINE HCL (PF) 0.25 % IJ SOLN
INTRAMUSCULAR | Status: DC | PRN
  Administered 2016-09-13: 8 mL

## 2016-09-13 MED ORDER — SODIUM CHLORIDE 0.9 % IJ SOLN
INTRAMUSCULAR | Status: AC
  Filled 2016-09-13: qty 10

## 2016-09-13 MED ORDER — SODIUM CHLORIDE 0.9 % IV SOLN: Freq: Once | INTRAVENOUS | Status: DC

## 2016-09-13 MED ORDER — HYDROMORPHONE HCL 1 MG/ML IJ SOLN
0.2500 mg | INTRAMUSCULAR | Status: DC | PRN
  Administered 2016-09-13 (×2): 0.5 mg via INTRAVENOUS

## 2016-09-13 MED ORDER — ONDANSETRON HCL 4 MG/2ML IJ SOLN
INTRAMUSCULAR | Status: DC | PRN
  Administered 2016-09-13: 4 mg via INTRAVENOUS

## 2016-09-13 MED ORDER — SUGAMMADEX SODIUM 200 MG/2ML IV SOLN
INTRAVENOUS | Status: AC
  Filled 2016-09-13: qty 2

## 2016-09-13 MED ORDER — PROMETHAZINE HCL 25 MG/ML IJ SOLN
6.2500 mg | INTRAMUSCULAR | Status: DC | PRN
  Administered 2016-09-13: 6.25 mg via INTRAVENOUS

## 2016-09-13 MED ORDER — LIDOCAINE 2% (20 MG/ML) 5 ML SYRINGE
INTRAMUSCULAR | Status: AC
  Filled 2016-09-13: qty 5

## 2016-09-13 MED ORDER — MIDAZOLAM HCL 2 MG/2ML IJ SOLN
INTRAMUSCULAR | Status: AC
  Filled 2016-09-13: qty 2

## 2016-09-13 MED ORDER — EPHEDRINE 5 MG/ML INJ
INTRAVENOUS | Status: AC
  Filled 2016-09-13: qty 10

## 2016-09-13 MED ORDER — PROPOFOL 10 MG/ML IV BOLUS
INTRAVENOUS | Status: DC | PRN
  Administered 2016-09-13: 180 mg via INTRAVENOUS

## 2016-09-13 MED ORDER — SODIUM CHLORIDE 0.9 % IJ SOLN
INTRAMUSCULAR | Status: DC | PRN
  Administered 2016-09-13: .8 mL via INTRADERMAL

## 2016-09-13 MED ORDER — GABAPENTIN 300 MG PO CAPS
ORAL_CAPSULE | ORAL | Status: AC
  Filled 2016-09-13: qty 1

## 2016-09-13 MED ORDER — ACETAMINOPHEN 500 MG PO TABS
ORAL_TABLET | ORAL | Status: AC
  Filled 2016-09-13: qty 2

## 2016-09-13 MED ORDER — MEPERIDINE HCL 25 MG/ML IJ SOLN: 6.2500 mg | INTRAMUSCULAR | Status: DC | PRN

## 2016-09-13 MED ORDER — FENTANYL CITRATE (PF) 100 MCG/2ML IJ SOLN
INTRAMUSCULAR | Status: AC
  Filled 2016-09-13: qty 2

## 2016-09-13 MED ORDER — PROMETHAZINE HCL 25 MG/ML IJ SOLN
INTRAMUSCULAR | Status: AC
  Filled 2016-09-13: qty 1

## 2016-09-13 MED ORDER — LACTATED RINGERS IV SOLN
INTRAVENOUS | Status: DC
  Administered 2016-09-13 (×3): via INTRAVENOUS

## 2016-09-13 MED ORDER — ROCURONIUM BROMIDE 50 MG/5ML IV SOSY
PREFILLED_SYRINGE | INTRAVENOUS | Status: AC
  Filled 2016-09-13: qty 5

## 2016-09-13 MED ORDER — CEFAZOLIN SODIUM-DEXTROSE 2-4 GM/100ML-% IV SOLN
2.0000 g | INTRAVENOUS | Status: AC
  Administered 2016-09-13: 2 g via INTRAVENOUS

## 2016-09-13 MED ORDER — ROPIVACAINE HCL 5 MG/ML IJ SOLN
INTRAMUSCULAR | Status: DC | PRN
  Administered 2016-09-13: 30 mL via PERINEURAL

## 2016-09-13 MED ORDER — 0.9 % SODIUM CHLORIDE (POUR BTL) OPTIME
TOPICAL | Status: DC | PRN
  Administered 2016-09-13: 500 mL

## 2016-09-13 MED ORDER — GABAPENTIN 300 MG PO CAPS: 300.0000 mg | ORAL_CAPSULE | ORAL | Status: DC

## 2016-09-13 MED ORDER — SCOPOLAMINE 1 MG/3DAYS TD PT72: 1.0000 | MEDICATED_PATCH | Freq: Once | TRANSDERMAL | Status: DC | PRN

## 2016-09-13 SURGICAL SUPPLY — 79 items
BAG DECANTER FOR FLEXI CONT (MISCELLANEOUS) IMPLANT
BENZOIN TINCTURE PRP APPL 2/3 (GAUZE/BANDAGES/DRESSINGS) IMPLANT
BINDER BREAST LRG (GAUZE/BANDAGES/DRESSINGS) IMPLANT
BINDER BREAST MEDIUM (GAUZE/BANDAGES/DRESSINGS) IMPLANT
BINDER BREAST XLRG (GAUZE/BANDAGES/DRESSINGS) ×2 IMPLANT
BINDER BREAST XXLRG (GAUZE/BANDAGES/DRESSINGS) IMPLANT
BLADE SURG 10 STRL SS (BLADE) ×2 IMPLANT
BLADE SURG 15 STRL LF DISP TIS (BLADE) ×1 IMPLANT
BLADE SURG 15 STRL SS (BLADE) ×1
BNDG GAUZE ELAST 4 BULKY (GAUZE/BANDAGES/DRESSINGS) IMPLANT
CANISTER SUC SOCK COL 7IN (MISCELLANEOUS) IMPLANT
CANISTER SUCT 1200ML W/VALVE (MISCELLANEOUS) ×2 IMPLANT
CHLORAPREP W/TINT 26ML (MISCELLANEOUS) ×2 IMPLANT
CLIP TI WIDE RED SMALL 6 (CLIP) ×2 IMPLANT
COVER BACK TABLE 60X90IN (DRAPES) ×2 IMPLANT
COVER MAYO STAND STRL (DRAPES) ×2 IMPLANT
COVER PROBE W GEL 5X96 (DRAPES) ×2 IMPLANT
DECANTER SPIKE VIAL GLASS SM (MISCELLANEOUS) IMPLANT
DERMABOND ADVANCED (GAUZE/BANDAGES/DRESSINGS)
DERMABOND ADVANCED .7 DNX12 (GAUZE/BANDAGES/DRESSINGS) IMPLANT
DEVICE DUBIN W/COMP PLATE 8390 (MISCELLANEOUS) ×2 IMPLANT
DRAIN CHANNEL 15F RND FF W/TCR (WOUND CARE) IMPLANT
DRAPE INCISE IOBAN 66X45 STRL (DRAPES) IMPLANT
DRAPE LAPAROSCOPIC ABDOMINAL (DRAPES) ×2 IMPLANT
DRAPE TOP ARMCOVERS (MISCELLANEOUS) IMPLANT
DRAPE U-SHAPE 76X120 STRL (DRAPES) IMPLANT
DRAPE UTILITY XL STRL (DRAPES) ×2 IMPLANT
DRSG PAD ABDOMINAL 8X10 ST (GAUZE/BANDAGES/DRESSINGS) ×2 IMPLANT
ELECT BLADE 4.0 EZ CLEAN MEGAD (MISCELLANEOUS) ×2
ELECT BLADE 6.5 .24CM SHAFT (ELECTRODE) IMPLANT
ELECT COATED BLADE 2.86 ST (ELECTRODE) ×2 IMPLANT
ELECT REM PT RETURN 9FT ADLT (ELECTROSURGICAL) ×2
ELECTRODE BLDE 4.0 EZ CLN MEGD (MISCELLANEOUS) ×1 IMPLANT
ELECTRODE REM PT RTRN 9FT ADLT (ELECTROSURGICAL) ×1 IMPLANT
EVACUATOR SILICONE 100CC (DRAIN) IMPLANT
GAUZE SPONGE 4X4 12PLY STRL (GAUZE/BANDAGES/DRESSINGS) IMPLANT
GAUZE SPONGE 4X4 12PLY STRL LF (GAUZE/BANDAGES/DRESSINGS) IMPLANT
GLOVE BIO SURGEON STRL SZ 6 (GLOVE) ×2 IMPLANT
GLOVE BIO SURGEON STRL SZ 6.5 (GLOVE) ×2 IMPLANT
GLOVE BIOGEL PI IND STRL 7.0 (GLOVE) ×1 IMPLANT
GLOVE BIOGEL PI INDICATOR 7.0 (GLOVE) ×1
GLOVE SURG SIGNA 7.5 PF LTX (GLOVE) ×4 IMPLANT
GLOVE SURG SS PI 6.5 STRL IVOR (GLOVE) ×4 IMPLANT
GOWN STRL REUS W/ TWL LRG LVL3 (GOWN DISPOSABLE) ×2 IMPLANT
GOWN STRL REUS W/ TWL XL LVL3 (GOWN DISPOSABLE) ×1 IMPLANT
GOWN STRL REUS W/TWL LRG LVL3 (GOWN DISPOSABLE) ×2
GOWN STRL REUS W/TWL XL LVL3 (GOWN DISPOSABLE) ×1
KIT MARKER MARGIN INK (KITS) ×2 IMPLANT
LIQUID BAND (GAUZE/BANDAGES/DRESSINGS) ×2 IMPLANT
NDL SAFETY ECLIPSE 18X1.5 (NEEDLE) ×2 IMPLANT
NEEDLE HYPO 18GX1.5 SHARP (NEEDLE) ×2
NEEDLE HYPO 25X1 1.5 SAFETY (NEEDLE) ×2 IMPLANT
NS IRRIG 1000ML POUR BTL (IV SOLUTION) ×2 IMPLANT
PACK BASIN DAY SURGERY FS (CUSTOM PROCEDURE TRAY) ×2 IMPLANT
PENCIL BUTTON HOLSTER BLD 10FT (ELECTRODE) ×2 IMPLANT
PIN SAFETY STERILE (MISCELLANEOUS) IMPLANT
SHEET MEDIUM DRAPE 40X70 STRL (DRAPES) ×2 IMPLANT
SLEEVE SCD COMPRESS KNEE MED (MISCELLANEOUS) ×2 IMPLANT
SPONGE LAP 18X18 X RAY DECT (DISPOSABLE) ×6 IMPLANT
STAPLER VISISTAT 35W (STAPLE) IMPLANT
STRIP CLOSURE SKIN 1/2X4 (GAUZE/BANDAGES/DRESSINGS) IMPLANT
SUT ETHILON 2 0 FS 18 (SUTURE) ×2 IMPLANT
SUT MNCRL AB 4-0 PS2 18 (SUTURE) ×4 IMPLANT
SUT VIC AB 3-0 PS1 18 (SUTURE)
SUT VIC AB 3-0 PS1 18XBRD (SUTURE) IMPLANT
SUT VIC AB 3-0 SH 27 (SUTURE)
SUT VIC AB 3-0 SH 27X BRD (SUTURE) IMPLANT
SUT VICRYL 3-0 CR8 SH (SUTURE) ×4 IMPLANT
SUT VICRYL 4-0 PS2 18IN ABS (SUTURE) ×4 IMPLANT
SWAB COLLECTION DEVICE MRSA (MISCELLANEOUS) IMPLANT
SWAB CULTURE ESWAB REG 1ML (MISCELLANEOUS) IMPLANT
SYR 50ML LL SCALE MARK (SYRINGE) IMPLANT
SYR BULB IRRIGATION 50ML (SYRINGE) ×2 IMPLANT
SYR CONTROL 10ML LL (SYRINGE) ×2 IMPLANT
TOWEL OR 17X24 6PK STRL BLUE (TOWEL DISPOSABLE) ×4 IMPLANT
TOWEL OR NON WOVEN STRL DISP B (DISPOSABLE) ×2 IMPLANT
TUBE CONNECTING 20X1/4 (TUBING) ×2 IMPLANT
UNDERPAD 30X30 (UNDERPADS AND DIAPERS) ×2 IMPLANT
YANKAUER SUCT BULB TIP NO VENT (SUCTIONS) ×2 IMPLANT

## 2016-09-13 NOTE — Op Note (Signed)
09/13/2016  1:35 PM  PATIENT:  Marissa Hall DOB: 1940/09/02 MRN: [id number]  PREOP DIAGNOSIS:   RIGHT BREAST CANCER  POSTOP DIAGNOSIS:    Right breast cancer, 12 and 2 o'clock position (T2, N0)  PROCEDURE:   Procedure(s): RIGHT BREAST LUMPECTOMY WITH RADIOACTIVE SEED X 2 AND SENTINEL LYMPH NODE BIOPSY, Injection of peri areolar area of breast with methylene blue (1.0 cc), deep sentinel lymph node biopsy - Lucia Gaskins REMOVAL RIGHT BREAST IMPLANT, RIGHT CAPSULECTOMY - [place]  SURGEON:   Alphonsa Overall, M.D.  First Assist:  B. Thimmappa, MD  I have a surgeon as a first assist to retract, expose, and assist on this difficult operation.  ANESTHESIA:   general  Anesthesiologist: Lynda Rainwater, MD CRNA: April W Carter, CRNA  General  EBL:  75  ml  DRAINS:  Per Dr. Iran Planas  LOCAL MEDICATIONS USED:   10 cc of 1/4% marcaine in the right axilla, right pectoral block  SPECIMEN:   Right breast lumpectomy, superior margin of right lumpectomy, right axillary sentinel lymph node biopsy (counts - 500, back ground - 5)  COUNTS CORRECT:  YES  INDICATIONS FOR PROCEDURE:  Marissa Hall is a 76 y.o. (DOB: 06-02-1941) white female whose primary care physician is CAMPBELL, STEPHEN D., MD and comes for right breast lumpectomy and right axillary sentinel lymph node biopsy. She sees Dr. Lindi Adie and [facility] for oncology.   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   She also has bilateral breast implants.  The right breast implant has ruptured.  Dr. Iran Planas plans to remove the right breast implant at this time.   The options for breast cancer treatment have been discussed with the patient. She elected to proceed with lumpectomy and axillary sentinel lymph node.     The indications and potential complications of surgery were explained to the patient. Potential complications include, but are not  limited to, bleeding, infection, the need for further surgery, and nerve injury.     She had a two (2) I131 seeds placed in her right breast at [facility].  One seed is at 12 o'clock and the second seed is at 2 o'clock.  I confirmed the presence of the I131 seed in the pre op area using the Neoprobe.   In the holding area, her right areola was injected with 1 millicurie of Technitium Sulfur Colloid.  OPERATIVE NOTE:   The patient was taken to room # 8 at [facility] where she underwent a general anesthesia  supervised by Anesthesiologist: Lynda Rainwater, MD CRNA: April W Carter, CRNA. Her right breast and axilla were prepped with  ChloraPrep and sterilely draped.    A time-out and the surgical check list was reviewed.    I injected about 0.5 mL of 40% methylene blue around her right areola.   I turned attention to the cancer which was about at the 12 and 2 o'clock position of the right breast.   I used the Neoprobe to identify the two I131 seeds.  I tried to excise an area around the tumor of at least 1 cm.    I excised this block of breast tissue approximately 5 cm by 6 cm  in diameter.   I painted the lumpectomy specimen with the 6 color paint kit and did a specimen mammogram which confirmed the mass, 2 clips, and the 2 seeds were all in the right position in the  specimen.  The specimen was sent to pathology who called back to confirm that they have the seeds and the specimen.    Because of proximity to the superior margin, I resected a superior margin (the suture is anterior) and sent this separately.   I then started the right deep axillary sentinel lymph node biopsy. I made an incision in the right axilla.  I found a hot area at the junction of the breast and the pectoralis major muscle, deep in the axilla. I cut down and  identified a hot node that had counts of 500 and the background has 5 counts. The lymph node was blue. I checked her internal mammary nodes and supraclavicular nodes with  the neoprobe and found no other hot area. The axillary node was then sent to pathology.    Dr. Iran Planas scrubbed to assisted with the case because of its difficulty.  She will continue with the implant extraction, she will mark the biopsy cavity, will dictate that part of the case, and will decide on drains.  Alphonsa Overall, MD, [facility] Surgery Pager: [phone] Office phone:  [phone]

## 2016-09-13 NOTE — Discharge Instructions (Signed)
[facility] - DISCHARGE INSTRUCTIONS TO PATIENT  Activity:  Driving - May drive in 2 or 3 days, if doing well    Wound Care:   Per Dr. Iran Planas  Diet:  As tolerated  Follow up appointment:  Call Dr. Pollie Friar office [facility] Surgery) at [phone] for an appointment in 2 weeks  Medications and dosages:  Resume your home medications.  Call Dr. Lucia Gaskins or his office  [phone]) if you have:  Temperature greater than 100.4,  Persistent nausea and vomiting,  Severe uncontrolled pain,  Redness, tenderness, or signs of infection (pain, swelling, redness, odor or green/yellow discharge around the site),  Difficulty breathing, headache or visual disturbances,  Any other questions or concerns you may have after discharge.  In an emergency, call 911 or go to an Emergency Department at a nearby hospital.   Post Anesthesia Home Care Instructions  Activity: Get plenty of rest for the remainder of the day. A responsible individual must stay with you for 24 hours following the procedure.  For the next 24 hours, DO NOT: -Drive a car -Paediatric nurse -Drink alcoholic beverages -Take any medication unless instructed by your physician -Make any legal decisions or sign important papers.  Meals: Start with liquid foods such as gelatin or soup. Progress to regular foods as tolerated. Avoid greasy, spicy, heavy foods. If nausea and/or vomiting occur, drink only clear liquids until the nausea and/or vomiting subsides. Call your physician if vomiting continues.  Special Instructions/Symptoms: Your throat may feel dry or sore from the anesthesia or the breathing tube placed in your throat during surgery. If this causes discomfort, gargle with warm salt water. The discomfort should disappear within 24 hours.  If you had a scopolamine patch placed behind your ear for the management of post- operative nausea and/or vomiting:  1. The medication in the patch is  effective for 72 hours, after which it should be removed.  Wrap patch in a tissue and discard in the trash. Wash hands thoroughly with soap and water. 2. You may remove the patch earlier than 72 hours if you experience unpleasant side effects which may include dry mouth, dizziness or visual disturbances. 3. Avoid touching the patch. Wash your hands with soap and water after contact with the patch.   About my Jackson-Pratt Bulb Drain  What is a Jackson-Pratt bulb? A Jackson-Pratt is a soft, round device used to collect drainage. It is connected to a long, thin drainage catheter, which is held in place by one or two small stiches near your surgical incision site. When the bulb is squeezed, it forms a vacuum, forcing the drainage to empty into the bulb.  Emptying the Jackson-Pratt bulb- To empty the bulb: 1. Release the plug on the top of the bulb. 2. Pour the bulb's contents into a measuring container which your nurse will provide. 3. Record the time emptied and amount of drainage. Empty the drain(s) as often as your     doctor or nurse recommends.  Date                  Time                    Amount (Drain 1)                 Amount (Drain 2)  _____________________________________________________________________  _____________________________________________________________________  _____________________________________________________________________  _____________________________________________________________________  _____________________________________________________________________  _____________________________________________________________________  _____________________________________________________________________  _____________________________________________________________________  Squeezing the Jackson-Pratt Bulb- To squeeze the bulb: 1. Make  sure the plug at the top of the bulb is open. 2. Squeeze the bulb tightly in your fist. You will hear air squeezing from  the bulb. 3. Replace the plug while the bulb is squeezed. 4. Use a safety pin to attach the bulb to your clothing. This will keep the catheter from     pulling at the bulb insertion site.  When to call your doctor- Call your doctor if:  Drain site becomes red, swollen or hot.  You have a fever greater than 101 degrees F.  There is oozing at the drain site.  Drain falls out (apply a guaze bandage over the drain hole and secure it with tape).  Drainage increases daily not related to activity patterns. (You will usually have more drainage when you are active than when you are resting.)  Drainage has a bad odor.

## 2016-09-13 NOTE — Anesthesia Postprocedure Evaluation (Signed)
Anesthesia Post Note  Patient: Marissa Hall  Procedure(s) Performed: Procedure(s) (LRB): RIGHT BREAST LUMPECTOMY WITH RADIOACTIVE SEED X 2 AND SENTINEL LYMPH NODE BIOPSY (Right) REMOVAL RIGHT BREAST IMPLANT (Right) RIGHT CAPSULECTOMY (Right)  Patient location during evaluation: PACU Anesthesia Type: General Level of consciousness: awake and alert Pain management: pain level controlled Vital Signs Assessment: post-procedure vital signs reviewed and stable Respiratory status: spontaneous breathing, nonlabored ventilation and respiratory function stable Cardiovascular status: blood pressure returned to baseline and stable Postop Assessment: no signs of nausea or vomiting Anesthetic complications: no       Last Vitals:  Vitals:   09/13/16 1500 09/13/16 1515  BP: (!) 163/76 (!) 159/78  Pulse: 71 65  Resp: 13 15  Temp:      Last Pain:  Vitals:   09/13/16 1515  TempSrc:   PainSc: [place]

## 2016-09-13 NOTE — Progress Notes (Signed)
Emotional support during breast injections °

## 2016-09-13 NOTE — Op Note (Signed)
Operative Note   DATE OF OPERATION: 4.10.18  LOCATION: [facility]-outpatient  SURGICAL DIVISION: Plastic Surgery  PREOPERATIVE DIAGNOSES:  1. Right breast cancer upper inner quadrant triple negative 2. Ruptured right breast implant  POSTOPERATIVE DIAGNOSES:  same  PROCEDURE:  1. Removal right breast implant 2. Total right capsulectomy  SURGEON: Irene Limbo MD MBA  ASSISTANT: none  ANESTHESIA:  General.   EBL: 100 ml for entire case  COMPLICATIONS: None immediate.   INDICATIONS FOR PROCEDURE:  The patient, Marissa Hall, is a 76 y.o. female born on [birth year], is here for right breast partial mastectomy. She has a history of silicone subglandular augmentation with known intracapsular rupture on right. Plan removal implant and capsulectomy.   FINDINGS: Removed ruptured right implant with stamp labeled "[place] 235 cc."  DESCRIPTION OF PROCEDURE:  The patient's operative site was marked with the patient in the preoperative area. The patient was taken to the operating room. SCDs were placed and IV antibiotics were given. The patient's operative site was prepped and draped in a sterile fashion. A time out was performed and all information was confirmed to be correct. Following completion of partial mastectomy and sentinel node biopsy, dissection over implant capsule completed utilizing the periareolar incisions. Complete capsulectomy completed. Gross rupture intracapsular noted and upon opening of capsule, portion of implant shell noted with stamp labeled as above. Cavity irrigated with saline and hemostasis obtained. 15 Fr drain placed and secured to skin with 2-0 nylon. The lumpectomy cavity was marked with clips. Please note the posterior surface of lumpectomy cavity was the implant capsule. This was removed in its entirety and posterior surface marked on pectoralis muscle. The breast tissue was approximated in area of partial mastectomy with interrupted 3-0 vicryl. The  areolar incision closed with 3-0 vicryl in superficial fascia, 4-0 vicryl in dermis and skin closure with 4-0 monocryl subcuticular. Tissue adhesive applied followed by dry dressing and breast binder.  The patient was allowed to wake from anesthesia, extubated and taken to the recovery room in satisfactory condition.   SPECIMENS: right implant capsule  DRAINS: [place]  Irene Limbo, MD [facility] [phone], pin [phone]

## 2016-09-13 NOTE — Anesthesia Procedure Notes (Signed)
Procedure Name: Intubation Date/Time: 09/13/2016 12:22 PM Performed by: Lieutenant Diego Pre-anesthesia Checklist: Patient identified, Emergency Drugs available, Suction available and Patient being monitored Patient Re-evaluated:Patient Re-evaluated prior to inductionOxygen Delivery Method: Circle system utilized Preoxygenation: Pre-oxygenation with 100% oxygen Intubation Type: IV induction Ventilation: Mask ventilation without difficulty Laryngoscope Size: Miller and 2 Grade View: Grade I Tube type: Oral Tube size: 7.0 mm Number of attempts: 1 Airway Equipment and Method: Stylet and Oral airway Placement Confirmation: ETT inserted through vocal cords under direct vision,  positive ETCO2 and breath sounds checked- equal and bilateral Secured at: 23 cm Tube secured with: Tape Dental Injury: Teeth and Oropharynx as per pre-operative assessment

## 2016-09-13 NOTE — Interval H&P Note (Signed)
History and Physical Interval Note:  09/13/2016 10:10 AM  Marissa Hall  has presented today for surgery, with the diagnosis of RIGHT BREAST CANCER  The various methods of treatment have been discussed with the patient and family. After consideration of risks, benefits and other options for treatment, the patient has consented to  Procedure(s): RIGHT BREAST LUMPECTOMY WITH RADIOACTIVE SEED X 2 AND SENTINEL LYMPH NODE BIOPSY (Right) REMOVAL RIGHT BREAST IMPLANTS (Right) RIGHT CAPSULECTOMY (Right) as a surgical intervention .  The patient's history has been reviewed, patient examined, no change in status, stable for surgery.  I have reviewed the patient's chart and labs.  Questions were answered to the patient's satisfaction.     Kayton Dunaj

## 2016-09-13 NOTE — Anesthesia Preprocedure Evaluation (Signed)
Anesthesia Evaluation    Airway Mallampati: II  TM Distance: >3 FB Neck ROM: Full    Dental no notable dental hx.    Pulmonary former smoker,    Pulmonary exam normal breath sounds clear to auscultation       Cardiovascular hypertension, Normal cardiovascular exam Rhythm:Regular Rate:Normal     Neuro/Psych PSYCHIATRIC DISORDERS Anxiety Depression Schizophrenia TIA   GI/Hepatic GERD  ,  Endo/Other  diabetes, Type 2, Oral Hypoglycemic AgentsHypothyroidism   Renal/GU      Musculoskeletal   Abdominal   Peds  Hematology   Anesthesia Other Findings   Reproductive/Obstetrics                             Anesthesia Physical  Anesthesia Plan  ASA: III  Anesthesia Plan: General   Post-op Pain Management: GA combined w/ Regional for post-op pain   Induction: Intravenous  Airway Management Planned: Oral ETT  Additional Equipment:   Intra-op Plan:   Post-operative Plan: Extubation in OR  Informed Consent: I have reviewed the patients History and Physical, chart, labs and discussed the procedure including the risks, benefits and alternatives for the proposed anesthesia with the patient or authorized representative who has indicated his/her understanding and acceptance.   Dental advisory given  Plan Discussed with: CRNA  Anesthesia Plan Comments:         Anesthesia Quick Evaluation

## 2016-09-13 NOTE — Transfer of Care (Signed)
Immediate Anesthesia Transfer of Care Note  Patient: Marissa Hall  Procedure(s) Performed: Procedure(s): RIGHT BREAST LUMPECTOMY WITH RADIOACTIVE SEED X 2 AND SENTINEL LYMPH NODE BIOPSY (Right) REMOVAL RIGHT BREAST IMPLANT (Right) RIGHT CAPSULECTOMY (Right)  Patient Location: PACU  Anesthesia Type:GA combined with regional for post-op pain  Level of Consciousness: awake, sedated and patient cooperative  Airway & Oxygen Therapy: Patient Spontanous Breathing and Patient connected to face mask oxygen  Post-op Assessment: Report given to RN and Post -op Vital signs reviewed and stable  Post vital signs: Reviewed and stable  Last Vitals:  Vitals:   09/13/16 1200 09/13/16 1434  BP: (!) 144/74   Pulse: (!) 52 76  Resp: 13 15  Temp:      Last Pain:  Vitals:   09/13/16 0859  TempSrc: Oral      Patients Stated Pain Goal: 0 (83/41/96 2229)  Complications: No apparent anesthesia complications

## 2016-09-13 NOTE — Progress Notes (Signed)
Assisted Dr. Miller with right, ultrasound guided, pectoralis block. Side rails up, monitors on throughout procedure. See vital signs in flow sheet. Tolerated Procedure well. 

## 2016-09-13 NOTE — Anesthesia Procedure Notes (Signed)
Anesthesia Regional Block: Pectoralis block   Pre-Anesthetic Checklist: ,, timeout performed, Correct Patient, Correct Site, Correct Laterality, Correct Procedure, Correct Position, site marked, Risks and benefits discussed,  Surgical consent,  Pre-op evaluation,  At surgeon's request and post-op pain management  Laterality: Right  Prep: Dura Prep       Needles:  Injection technique: Single-shot  Needle Type: Stimiplex     Needle Length: 9cm  Needle Gauge: 21     Additional Needles:   Procedures: ultrasound guided,,,,,,,,  Narrative:  Start time: 09/13/2016 9:51 AM End time: 09/13/2016 9:59 AM Injection made incrementally with aspirations every 5 mL.

## 2016-09-14 ENCOUNTER — Encounter (HOSPITAL_BASED_OUTPATIENT_CLINIC_OR_DEPARTMENT_OTHER): Payer: Self-pay | Admitting: Surgery

## 2016-09-15 NOTE — Progress Notes (Signed)
[facility] Counseling Note  Patient stated in last session that she had surgery scheduled for this week and thus would not be able to make it to counseling. Counselor called to f/u on her post-surgery and see if she wanted to schedule another appointment. Counselor will call back 4/13.  Westly Pam, [place] [facility] [place]

## 2016-09-20 ENCOUNTER — Ambulatory Visit (HOSPITAL_BASED_OUTPATIENT_CLINIC_OR_DEPARTMENT_OTHER): Payer: Medicare Other | Admitting: Hematology and Oncology

## 2016-09-20 ENCOUNTER — Encounter: Payer: Self-pay | Admitting: Hematology and Oncology

## 2016-09-20 ENCOUNTER — Encounter: Payer: Self-pay | Admitting: *Deleted

## 2016-09-20 DIAGNOSIS — C50211 Malignant neoplasm of upper-inner quadrant of right female breast: Secondary | ICD-10-CM

## 2016-09-20 DIAGNOSIS — Z171 Estrogen receptor negative status [ER-]: Secondary | ICD-10-CM

## 2016-09-20 NOTE — Progress Notes (Signed)
Patient Care Team: Estill Bamberg. [name], MD as PCP - General (Internal Medicine) Alphonsa Overall, MD as Consulting Physician (General Surgery) Nicholas Lose, MD as Consulting Physician (Hematology and Oncology) Kyung Rudd, MD as Consulting Physician (Radiation Oncology) Irene Limbo, MD as Consulting Physician (Plastic Surgery)  DIAGNOSIS:  Encounter Diagnosis  Name Primary?  . Malignant neoplasm of upper-inner quadrant of right breast in female, estrogen receptor negative ([place])     SUMMARY OF ONCOLOGIC HISTORY:   Breast cancer of upper-inner quadrant of right female breast ([place])   02/29/2016 Initial Diagnosis    Right breast palpable mass (with silicone implants 6440), 3.5 cm on MRI, additional 3 cm anterior linear enhancement? 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 Ki-67 60%, T2 N0 stage 2A clinical stage      03/14/2016 Procedure    Right breast biopsy upper inner quadrant: IDC grade 3      03/28/2016 -  Neo-Adjuvant Chemotherapy    Neoadjuvant chemotherapy with dose dense Adriamycin and Cytoxan followed by Abraxane weekly 5 ( patient is diabetic and cannot take steroids)      07/15/2016 Breast MRI    Right breast: Spiculated mass 1.5 cm significantly smaller compared to prior,NME previously seen is not noted, no abnormal lymph nodes      09/13/2016 Surgery    Removal of the silicone implant due to intracapsular rupture and capsulectomy (Dr.Thimappa)      09/13/2016 Surgery    Right lumpectomy: IDC grade 3, 2 foci, 2 cm and 1.1 cm, 0/3 lymph nodes negative margins negative, ER 0%, PR 0%, HER-2 negative ratio 1.02, Ki-67 60%, RCB-II; ypT2ypN0 Stage 2A        CHIEF COMPLIANT: Follow-up after recent right lumpectomy  INTERVAL HISTORY: Marissa Hall is a 76 year old with above-mentioned history of right breast cancer who was treated with neoadjuvant chemotherapy and underwent lumpectomy. She is here today to discuss the results of the pathology report. She is  healing and recovering from the surgery. During the surgery they had to remove the silicone implant which had previously ruptured.  REVIEW OF SYSTEMS:   Constitutional: Denies fevers, chills or abnormal weight loss Eyes: Denies blurriness of vision Ears, nose, mouth, throat, and face: Denies mucositis or sore throat Respiratory: Denies cough, dyspnea or wheezes Cardiovascular: Denies palpitation, chest discomfort Gastrointestinal:  Denies nausea, heartburn or change in bowel habits Skin: Denies abnormal skin rashes Lymphatics: Denies new lymphadenopathy or easy bruising Neurological: Continues to have neuropathy Behavioral/Psych: Mood is stable, no new changes  Extremities: No lower extremity edema Breast: Recent right lumpectomy All other systems were reviewed with the patient and are negative.  I have reviewed the past medical history, past surgical history, social history and family history with the patient and they are unchanged from previous note.  ALLERGIES:  is allergic to other and penicillins.  MEDICATIONS:  Current Outpatient Prescriptions  Medication Sig Dispense Refill  . atorvastatin (LIPITOR) 80 MG tablet Take 80 mg by mouth every evening.     [name] Kitchen azelastine (ASTELIN) 0.1 % nasal spray Place 2 sprays into both nostrils daily as needed for rhinitis or allergies. Use in each nostril as directed    . gabapentin (NEURONTIN) 100 MG capsule Take 300 mg by mouth 2 (two) times daily.     [name] Kitchen levothyroxine (SYNTHROID, LEVOTHROID) 100 MCG tablet Take 100 mcg by mouth daily before breakfast.     . lisinopril (PRINIVIL,ZESTRIL) 20 MG tablet Take 20 mg by mouth every evening.     . magnesium  oxide (MAG-OX) 400 (241.3 Mg) MG tablet Take 1.5 tablets (600 mg total) by mouth daily. 30 tablet 3  . metFORMIN (GLUCOPHAGE-XR) 500 MG 24 hr tablet Take 1,000 mg by mouth daily with supper.     [name] Kitchen omeprazole (PRILOSEC) 40 MG capsule Take 1 capsule (40 mg total) by mouth daily. 30 capsule 2  .  oxyCODONE (OXY IR/ROXICODONE) 5 MG immediate release tablet Take 1 tablet (5 mg total) by mouth once as needed (for pain score of 1-4). 30 tablet 0  . Vitamin D, Ergocalciferol, (DRISDOL) 50000 units CAPS capsule Take 50,000 Units by mouth every 7 (seven) days. Saturdays     No current facility-administered medications for this visit.     PHYSICAL EXAMINATION: ECOG PERFORMANCE STATUS: 1 - Symptomatic but completely ambulatory  GENERAL:alert, no distress and comfortable SKIN: skin color, texture, turgor are normal, no rashes or significant lesions EYES: normal, Conjunctiva are pink and non-injected, sclera clear OROPHARYNX:no exudate, no erythema and lips, buccal mucosa, and tongue normal  NECK: supple, thyroid normal size, non-tender, without nodularity LYMPH:  no palpable lymphadenopathy in the cervical, axillary or inguinal LUNGS: clear to auscultation and percussion with normal breathing effort HEART: regular rate & rhythm and no murmurs and no lower extremity edema ABDOMEN:abdomen soft, non-tender and normal bowel sounds MUSCULOSKELETAL:no cyanosis of digits and no clubbing  NEURO: alert & oriented x 3 with fluent speech, bilateral leg spent for neuropathy grade 2 EXTREMITIES: No lower extremity edema  LABORATORY DATA:  I have reviewed the data as listed   Chemistry      Component Value Date/Time   NA 139 09/07/2016 1320   NA 140 07/11/2016 1100   K 4.4 09/07/2016 1320   K 4.1 07/11/2016 1100   CL 102 09/07/2016 1320   CO2 29 09/07/2016 1320   CO2 25 07/11/2016 1100   BUN 12 09/07/2016 1320   BUN 14.9 07/11/2016 1100   CREATININE 0.81 09/07/2016 1320   CREATININE 0.8 07/11/2016 1100      Component Value Date/Time   CALCIUM 9.6 09/07/2016 1320   CALCIUM 9.4 07/11/2016 1100   ALKPHOS 88 07/11/2016 1100   AST 28 07/11/2016 1100   ALT 31 07/11/2016 1100   BILITOT 0.86 07/11/2016 1100       Lab Results  Component Value Date   WBC 2.5 (L) 07/11/2016   HGB 13.0  09/13/2016   HCT 32.5 (L) 07/11/2016   MCV 99.8 07/11/2016   PLT 167 07/11/2016   NEUTROABS 1.3 (L) 07/11/2016    ASSESSMENT & PLAN:  Breast cancer of upper-inner quadrant of right female breast ([place]) 02/29/2016: Right breast palpable mass (with silicone implants 5573), 3.5 cm on MRI, additional 3 cm anterior linear enhancement (biopsy 03/14/2016 IDC grade 3); 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  T2 N0 stage 2A clinical stage  Treatment summary: 1. Neoadjuvant chemotherapy with dose dense Adriamycin and Cytoxan 4 followed by Abraxane weekly 5 (stopped early due to neuropathy) 2. 09/13/2016: Right lumpectomy: IDC grade 3, 2 foci, 2 cm and 1.1 cm, 0/3 lymph nodes negative margins negative, ER 0%, PR 0%, HER-2 negative ratio 1.02, Ki-67 60%, RCB-II; ypT2ypN0 Stage 2A (with plastic surgery removing the ruptured implant) 3. Followed by radiation therapy -----------------------------------------------------------------------------------------------------------------------------------------  09/13/2016: Right lumpectomy: IDC grade 3, 2 foci, 2 cm and 1.1 cm, 0/3 lymph nodes negative margins negative, ER 0%, PR 0%, HER-2 negative ratio 1.02, Ki-67 60%, RCB-II; ypT2ypN0 Stage 2A  Pathology counseling: I discussed the final pathology report of the patient  provided  a copy of this report. I discussed the margins as well as lymph node surgeries. We also discussed the final staging along with previously performed ER/PR and HER-2/neu testing.  Counseling: 1. Participation in clinical trial SWOG 1418: Pembro vs Obs 2. discussed the role of adjuvant Xeloda  Once she completes radiation I will sit down with her and finalize a treatment plan. She was provided literature on Xeloda as well as a clinical trial.   I spent 25 minutes talking to the patient of which more than half was spent in counseling and coordination of care.  No orders of the defined types were placed in this encounter.  The  patient has a good understanding of the overall plan. she agrees with it. she will call with any problems that may develop before the next visit here.   Rulon Eisenmenger, MD 09/20/16

## 2016-09-20 NOTE — Assessment & Plan Note (Signed)
02/29/2016: Right breast palpable mass (with silicone implants 6712), 3.5 cm on MRI, additional 3 cm anterior linear enhancement (biopsy 03/14/2016 IDC grade 3); 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  T2 N0 stage 2A clinical stage  Treatment summary: 1. Neoadjuvant chemotherapy with dose dense Adriamycin and Cytoxan 4 followed by Abraxane weekly 5 (stopped early due to neuropathy) 2. 09/13/2016: Right lumpectomy: IDC grade 3, 2 foci, 2 cm and 1.1 cm, 0/3 lymph nodes negative margins negative, ER 0%, PR 0%, HER-2 negative ratio 1.02, Ki-67 60%, RCB-II; ypT2ypN0 Stage 2A (with plastic surgery removing the ruptured implant) 3. Followed by radiation therapy -----------------------------------------------------------------------------------------------------------------------------------------  09/13/2016: Right lumpectomy: IDC grade 3, 2 foci, 2 cm and 1.1 cm, 0/3 lymph nodes negative margins negative, ER 0%, PR 0%, HER-2 negative ratio 1.02, Ki-67 60%, RCB-II; ypT2ypN0 Stage 2A  Pathology counseling: I discussed the final pathology report of the patient provided  a copy of this report. I discussed the margins as well as lymph node surgeries. We also discussed the final staging along with previously performed ER/PR and HER-2/neu testing.  Counseling: 1. Participation in clinical trial SWOG 1418: Pembro vs Obs 2. discussed the role of adjuvant Xeloda

## 2016-09-22 NOTE — Progress Notes (Signed)
Location of Breast Cancer: Right Breast  Histology per Pathology Report: FINAL DIAGNOSIS 09/13/2016 Diagnosis 1. Breast, lumpectomy, Right - INVASIVE DUCTAL CARCINOMA, GRADE III/III, TWO FOCI, SPANNING 2.0 CM AND 1.1 CM. - INVASIVE CARCINOMA IS BROADLY LESS THAN 0.1 CM TO THE POSTERIOR MARGIN OF SPECIMEN # 1. - SEE ONCOLOGY TABLE BELOW. 2. Lymph node, sentinel, biopsy, Right axillary - THERE IS NO EVIDENCE OF CARCINOMA 1 OF 1 LYMPH NODE (0/1). 3. Lymph node, sentinel, biopsy, Right axillary - THERE IS NO EVIDENCE OF CARCINOMA 1 OF 1 LYMPH NODE (0/1). 4. Lymph node, sentinel, biopsy, Right axillary - THERE IS NO EVIDENCE OF CARCINOMA 1 OF 1 LYMPH NODE (0/1). 5. Breast, excision, Right additional superior margin - BENIGN FIBROADIPOSE TISSUE. - THERE IS NO EVIDENCE OF MALIGNANCY. - SEE COMMENT. 6. Breast, capsule, Right - DENSE FIBROADIPOSE TISSUE WITH DYSTROPHIC CALCIFICATIONS, CONSISTENT WITH CLINICALLY STATED CAPSULE. - THERE IS NO EVIDENCE OF MALIGNANCY.  Receptor Status: ER( - ), PR ( - ), Her2-neu (- ), Ki-(    )  Did patient present with symptoms (if so, please note symptoms) or was this found on screening mammography?:  Present with a palpable mass in her right breast  Past/Anticipated interventions by surgeon, if any: lumpectomy performed 2 weeks ago. Nodes and margins negative  Past/Anticipated interventions by medical oncology, if any: Chemotherapy Adriamycin./Cytoxan/Abraxane started on 03/28/2016. Unable to complete all 12 due to neuropathy.  Xeloda to start after radiation completed.  Lymphedema issues, if any:  no  Pain issues, if any:  Reports her right breast remains sore and her incisions dry. Demonstrates full ROM of both upper extremities. Reports fluid has collected in excision cavity of right breast but, Lucia Gaskins believes it will resolve on its own. Patient scheduled to follow up with [facility] in six months.   SAFETY ISSUES:  Prior radiation? No  Pacemaker/ICD?  No  Possible current pregnancy? No  Is the patient on methotrexate? No  Current Complaints / other details:  76 year old female.

## 2016-09-22 NOTE — Progress Notes (Signed)
[facility] Counseling Note  Called pt to reschedule appointment next week. New appointment time: 4/27 at 4p.  Westly Pam, [place] [facility]

## 2016-09-26 ENCOUNTER — Telehealth: Payer: Self-pay | Admitting: Hematology and Oncology

## 2016-09-26 NOTE — Telephone Encounter (Signed)
Called patient to inform her of next scheduled appointments. Patient scheduled for flush appointment on 4.25.18 per patient request / last flush appointment 2.5.18.

## 2016-09-27 NOTE — Progress Notes (Signed)
Radiation Oncology         (336) [phone] ________________________________  Name: Shakirah Kirkey MRN: [id number]  Date: 09/28/2016  DOB: [birth year]  BT:[id number], Estill Bamberg., MD  Nicholas Lose, MD     REFERRING PHYSICIAN: Nicholas Lose, MD   DIAGNOSIS: The encounter diagnosis was Malignant neoplasm of central portion of right breast in female, estrogen receptor negative ([place]).   HISTORY OF PRESENT ILLNESS: Jazz Biddy is a 76 y.o. female originally seen in the fall of 2017 in the multidisciplinary breast cancer clinic for a new diagnosis of triple negative right sided breast cancer. The patient  found fullness in the right breast. On diagnostic imaging, she was found to have a 3.2 cm right sided breast tumor, and a 86mm area of fibrocystic change. She underwent biopsy of the dominant lesion on 02/29/16 that revealed triple negative invasive ductal carcinoma of the right breast. She had an MRI also performed on 03/04/16 revealing a 3.5 cm right sided breast tumor consistent with the biopsied lesion, as well as a 3 cm linear abnormality, totally spanning 6.4-6.5 cm. She has been counseled on another biopsy of the new linear contour abnormality of the right breast which came back as malignancy. Dr. Lindi Adie treated her with neoadjuvant chemotherapy with 4 cycles of Cytoxan, Adriamycin, and Abraxane between 03/28/16-06/27/16.  Her post neoadjuvant imaging revealed the mass to be 1.3 x 1.2 x 1.5 cm, and non mass enhancement to be resolved. She underwent right lumpectomy with sentinel node evaluation on 09/13/16 revealing two foci of 2 and 1.1 cm of grade III invasive ductal carcinoma less than 1 mm from the posterior margin. All three nodes were negative for disease. She comes today to discuss options for radiotherapy.   PREVIOUS RADIATION THERAPY: No   PAST MEDICAL HISTORY:  Past Medical History:  Diagnosis Date  . Anxiety   . Breast cancer ([place])   . Depression   . Diabetes ([place])   . GERD  (gastroesophageal reflux disease)   . Hypertension   . Hypothyroidism   . Neuromuscular disorder ([place])    peripheral neuropathy  . Neuropathy    feet  . TIA (transient ischemic attack)        PAST SURGICAL HISTORY: Past Surgical History:  Procedure Laterality Date  . ABDOMINAL HYSTERECTOMY  1972  . APPENDECTOMY  1972  . BREAST ENHANCEMENT SURGERY  1982  . BREAST IMPLANT REMOVAL Right 09/13/2016   Procedure: REMOVAL RIGHT BREAST IMPLANT;  Surgeon: Irene Limbo, MD;  Location: [facility];  Service: Plastics;  Laterality: Right;  . BREAST LUMPECTOMY WITH RADIOACTIVE SEED AND SENTINEL LYMPH NODE BIOPSY Right 09/13/2016   Procedure: RIGHT BREAST LUMPECTOMY WITH RADIOACTIVE SEED X 2 AND SENTINEL LYMPH NODE BIOPSY;  Surgeon: Alphonsa Overall, MD;  Location: [facility];  Service: General;  Laterality: Right;  . BREAST SURGERY     breast biopsy  . CAPSULECTOMY Right 09/13/2016   Procedure: RIGHT CAPSULECTOMY;  Surgeon: Irene Limbo, MD;  Location: [facility];  Service: Plastics;  Laterality: Right;  . PORTACATH PLACEMENT N/A 03/15/2016   Procedure: INSERTION PORT-A-CATH WITH [place];  Surgeon: Alphonsa Overall, MD;  Location[facility];  Service: General;  Laterality: N/A;  . surgical repair left ankle  2009     FAMILY HISTORY: History reviewed. No pertinent family history.   SOCIAL HISTORY:  reports that she quit smoking about 17 years ago. Her smoking use included Cigarettes. She smoked 1.00 pack per day. She has never used smokeless tobacco. She  reports that she drinks alcohol. She reports that she does not use drugs. 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   ALLERGIES: Other and Penicillins   MEDICATIONS:  Current Outpatient Prescriptions  Medication Sig Dispense Refill  . atorvastatin (LIPITOR) 80 MG tablet Take 80 mg by mouth every evening.     . gabapentin (NEURONTIN) 100 MG  capsule Take 300 mg by mouth 2 (two) times daily.     [name] Kitchen levothyroxine (SYNTHROID, LEVOTHROID) 100 MCG tablet Take 100 mcg by mouth daily before breakfast.     . lisinopril (PRINIVIL,ZESTRIL) 20 MG tablet Take 20 mg by mouth every evening.     . magnesium oxide (MAG-OX) 400 (241.3 Mg) MG tablet Take 1.5 tablets (600 mg total) by mouth daily. 30 tablet 3  . metFORMIN (GLUCOPHAGE-XR) 500 MG 24 hr tablet Take 1,000 mg by mouth daily with supper.     . Vitamin D, Ergocalciferol, (DRISDOL) 50000 units CAPS capsule Take 50,000 Units by mouth every 7 (seven) days. Saturdays    . azelastine (ASTELIN) 0.1 % nasal spray Place 2 sprays into both nostrils daily as needed for rhinitis or allergies. Use in each nostril as directed    . omeprazole (PRILOSEC) 40 MG capsule Take 1 capsule (40 mg total) by mouth daily. (Patient not taking: Reported on 09/28/2016) 30 capsule 2   No current facility-administered medications for this encounter.      REVIEW OF SYSTEMS: On review of systems, the patient reports that she is doing well overall. She's sore over her incision site but denies any bleeding or drainage form her incision site. She denies any chest pain, shortness of breath, cough, fevers, chills, night sweats, unintended weight changes. She denies any bowel or bladder disturbances, and denies abdominal pain, nausea or vomiting. She denies any new musculoskeletal or joint aches or pains. A complete review of systems is obtained and is otherwise negative.     PHYSICAL EXAM:  Wt Readings from Last 3 Encounters:  09/28/16 178 lb (80.7 kg)  09/13/16 176 lb 12.8 oz (80.2 kg)  07/20/16 177 lb (80.3 kg)   Temp Readings from Last 3 Encounters:  09/13/16 97.9 F (36.6 C)  07/20/16 97.5 F (36.4 C) (Oral)  07/11/16 97.8 F (36.6 C) (Oral)   BP Readings from Last 3 Encounters:  09/28/16 (!) 163/65  09/13/16 (!) 168/73  07/20/16 (!) 146/60   Pulse Readings from Last 3 Encounters:  09/28/16 (!) 54  09/13/16  (!) 59  07/20/16 70    Pain scale 0/10 In general this is a well appearing caucasian female in no acute distress. She's alert and oriented x4 and appropriate throughout the examination. Cardiopulmonary assessment is negative for acute distress and she exhibits normal effort. Her right breast reveals a well healing scar along the upper aspect of the areola and a well healing right axillary incision site. Both lack any cellulitic changes or separation.   ECOG = 1  0 - Asymptomatic (Fully active, able to carry on all predisease activities without restriction)  1 - Symptomatic but completely ambulatory (Restricted in physically strenuous activity but ambulatory and able to carry out work of a light or sedentary nature. For example, light housework, office work)  2 - Symptomatic, <50% in bed during the day (Ambulatory and capable of all self care but unable to carry out any work activities. Up and about more than 50% of waking hours)  3 - Symptomatic, >50%  in bed, but not bedbound (Capable of only limited self-care, confined to bed or chair 50% or more of waking hours)  4 - Bedbound (Completely disabled. Cannot carry on any self-care. Totally confined to bed or chair)  5 - Death   Eustace Pen MM, Creech RH, Tormey DC, et al. [phone]). "Toxicity and response criteria of the [facility]". [place] Oncol. 5 (6): 649-55    LABORATORY DATA:  Lab Results  Component Value Date   WBC 2.5 (L) 07/11/2016   HGB 13.0 09/13/2016   HCT 32.5 (L) 07/11/2016   MCV 99.8 07/11/2016   PLT 167 07/11/2016   Lab Results  Component Value Date   NA 139 09/07/2016   K 4.4 09/07/2016   CL 102 09/07/2016   CO2 29 09/07/2016   Lab Results  Component Value Date   ALT 31 07/11/2016   AST 28 07/11/2016   ALKPHOS 88 07/11/2016   BILITOT 0.86 07/11/2016      RADIOGRAPHY: No results found.     IMPRESSION/PLAN: 1. 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 Dr.  Lisbeth Renshaw reviews her surgical pathology as well as the recommendations for proceeding with radiotherapy. He recommends  33 fractions of treatment over 6 1/2 weeks.  We discussed the risks, benefits, short, and long term effects of radiotherapy, and the patient is interested in proceeding. Dr. Lisbeth Renshaw discusses the delivery and logistics of radiotherapy. We will set her up for simulation next week, and anticipate starting the week of 10/10/16. She is in agreement and has signed written consent. A copy is provided to the patient and the original placed in the chart. She will also begin Xeloda following her radiotherapy per Dr. Lindi Adie.  In a visit lasting 45 minutes, greater than 50% of the time was spent face to face discussing the role of radiotherapy, and coordinating the patient's care.   The above documentation reflects my direct findings during this shared patient visit. Please see the separate note by Dr. Lisbeth Renshaw on this date for the remainder of the patient's plan of care.    Carola Rhine, PAC

## 2016-09-28 ENCOUNTER — Ambulatory Visit (HOSPITAL_BASED_OUTPATIENT_CLINIC_OR_DEPARTMENT_OTHER): Payer: Medicare Other

## 2016-09-28 ENCOUNTER — Ambulatory Visit
Admission: RE | Admit: 2016-09-28 | Discharge: 2016-09-28 | Disposition: A | Discharge status: Home / Self Care | Payer: Medicare Other | Source: Ambulatory Visit | Attending: Radiation Oncology | Admitting: Radiation Oncology

## 2016-09-28 ENCOUNTER — Encounter: Payer: Self-pay | Admitting: Radiation Oncology

## 2016-09-28 VITALS — BP 163/65 | HR 54 | Resp 18 | Wt 178.0 lb

## 2016-09-28 DIAGNOSIS — Z452 Encounter for adjustment and management of vascular access device: Secondary | ICD-10-CM

## 2016-09-28 DIAGNOSIS — Z87891 Personal history of nicotine dependence: Secondary | ICD-10-CM | POA: Insufficient documentation

## 2016-09-28 DIAGNOSIS — Z79899 Other long term (current) drug therapy: Secondary | ICD-10-CM | POA: Insufficient documentation

## 2016-09-28 DIAGNOSIS — E1142 Type 2 diabetes mellitus with diabetic polyneuropathy: Secondary | ICD-10-CM | POA: Diagnosis not present

## 2016-09-28 DIAGNOSIS — F329 Major depressive disorder, single episode, unspecified: Secondary | ICD-10-CM | POA: Insufficient documentation

## 2016-09-28 DIAGNOSIS — Z171 Estrogen receptor negative status [ER-]: Secondary | ICD-10-CM | POA: Diagnosis not present

## 2016-09-28 DIAGNOSIS — C50111 Malignant neoplasm of central portion of right female breast: Secondary | ICD-10-CM | POA: Diagnosis not present

## 2016-09-28 DIAGNOSIS — E039 Hypothyroidism, unspecified: Secondary | ICD-10-CM | POA: Insufficient documentation

## 2016-09-28 DIAGNOSIS — Z9221 Personal history of antineoplastic chemotherapy: Secondary | ICD-10-CM | POA: Insufficient documentation

## 2016-09-28 DIAGNOSIS — Z7984 Long term (current) use of oral hypoglycemic drugs: Secondary | ICD-10-CM | POA: Insufficient documentation

## 2016-09-28 DIAGNOSIS — Z8673 Personal history of transient ischemic attack (TIA), and cerebral infarction without residual deficits: Secondary | ICD-10-CM | POA: Diagnosis not present

## 2016-09-28 DIAGNOSIS — K219 Gastro-esophageal reflux disease without esophagitis: Secondary | ICD-10-CM | POA: Diagnosis not present

## 2016-09-28 DIAGNOSIS — Z95828 Presence of other vascular implants and grafts: Secondary | ICD-10-CM

## 2016-09-28 DIAGNOSIS — F419 Anxiety disorder, unspecified: Secondary | ICD-10-CM | POA: Insufficient documentation

## 2016-09-28 DIAGNOSIS — Z9889 Other specified postprocedural states: Secondary | ICD-10-CM | POA: Diagnosis not present

## 2016-09-28 DIAGNOSIS — Z510 Encounter for antineoplastic radiation therapy: Secondary | ICD-10-CM | POA: Insufficient documentation

## 2016-09-28 DIAGNOSIS — C50211 Malignant neoplasm of upper-inner quadrant of right female breast: Secondary | ICD-10-CM | POA: Diagnosis not present

## 2016-09-28 DIAGNOSIS — Z880 Allergy status to penicillin: Secondary | ICD-10-CM | POA: Insufficient documentation

## 2016-09-28 DIAGNOSIS — I10 Essential (primary) hypertension: Secondary | ICD-10-CM | POA: Insufficient documentation

## 2016-09-28 HISTORY — DX: Malignant neoplasm of unspecified site of unspecified female breast: C50.919

## 2016-09-28 MED ORDER — HEPARIN SOD (PORK) LOCK FLUSH 100 UNIT/ML IV SOLN
500.0000 [IU] | Freq: Once | INTRAVENOUS | Status: AC | PRN
  Administered 2016-09-28: 500 [IU] via INTRAVENOUS
  Filled 2016-09-28: qty 5

## 2016-09-28 MED ORDER — SODIUM CHLORIDE 0.9% FLUSH
10.0000 mL | INTRAVENOUS | Status: DC | PRN
  Administered 2016-09-28: 10 mL via INTRAVENOUS
  Filled 2016-09-28: qty 10

## 2016-09-28 NOTE — Progress Notes (Signed)
See progress note under physician encounter. 

## 2016-09-30 NOTE — Progress Notes (Signed)
[facility] Counseling Note  Individual session with pt. Counselor and pt discussed issues of grief and loss related to cancer treatment and influence in her life. Pt reported experiencing a depressed mood and, after discussing the potential origins of lowered mood, counselor and pt discussed current coping strategies and introduced new coping mechanisms. Counselor checked for safety concerns, and pt reported no SI.  Westly Pam, [place] LP[facility] [place]

## 2016-10-03 ENCOUNTER — Encounter: Payer: Self-pay | Admitting: Adult Health

## 2016-10-03 ENCOUNTER — Other Ambulatory Visit (HOSPITAL_BASED_OUTPATIENT_CLINIC_OR_DEPARTMENT_OTHER): Payer: Medicare Other

## 2016-10-03 ENCOUNTER — Telehealth: Payer: Self-pay

## 2016-10-03 ENCOUNTER — Ambulatory Visit: Payer: Medicare Other | Admitting: Adult Health

## 2016-10-03 ENCOUNTER — Other Ambulatory Visit: Payer: Medicare Other

## 2016-10-03 ENCOUNTER — Ambulatory Visit (HOSPITAL_BASED_OUTPATIENT_CLINIC_OR_DEPARTMENT_OTHER): Payer: Medicare Other | Admitting: Adult Health

## 2016-10-03 ENCOUNTER — Ambulatory Visit (HOSPITAL_BASED_OUTPATIENT_CLINIC_OR_DEPARTMENT_OTHER): Payer: Medicare Other

## 2016-10-03 VITALS — BP 157/49 | HR 62 | Temp 98.0°F | Resp 18 | Ht 68.0 in | Wt 175.0 lb

## 2016-10-03 DIAGNOSIS — N644 Mastodynia: Secondary | ICD-10-CM | POA: Diagnosis not present

## 2016-10-03 DIAGNOSIS — C50211 Malignant neoplasm of upper-inner quadrant of right female breast: Secondary | ICD-10-CM

## 2016-10-03 DIAGNOSIS — Z452 Encounter for adjustment and management of vascular access device: Secondary | ICD-10-CM

## 2016-10-03 DIAGNOSIS — Z171 Estrogen receptor negative status [ER-]: Secondary | ICD-10-CM | POA: Diagnosis not present

## 2016-10-03 DIAGNOSIS — Z95828 Presence of other vascular implants and grafts: Secondary | ICD-10-CM

## 2016-10-03 LAB — CBC WITH DIFFERENTIAL/PLATELET
BASO%: 0.8 % (ref 0.0–2.0)
Basophils Absolute: 0 10*3/uL (ref 0.0–0.1)
EOS ABS: 0.1 10*3/uL (ref 0.0–0.5)
EOS%: 2.4 % (ref 0.0–7.0)
HCT: 37.6 % (ref 34.8–46.6)
HEMOGLOBIN: 12.5 g/dL (ref 11.6–15.9)
LYMPH#: 1.6 10*3/uL (ref 0.9–3.3)
LYMPH%: 32.6 % (ref 14.0–49.7)
MCH: 31.9 pg (ref 25.1–34.0)
MCHC: 33.2 g/dL (ref 31.5–36.0)
MCV: 95.9 fL (ref 79.5–101.0)
MONO#: 0.3 10*3/uL (ref 0.1–0.9)
MONO%: 6.9 % (ref 0.0–14.0)
NEUT%: 57.3 % (ref 38.4–76.8)
NEUTROS ABS: 2.8 10*3/uL (ref 1.5–6.5)
PLATELETS: 161 10*3/uL (ref 145–400)
RBC: 3.92 10*6/uL (ref 3.70–5.45)
RDW: 13.9 % (ref 11.2–14.5)
WBC: 4.9 10*3/uL (ref 3.9–10.3)

## 2016-10-03 MED ORDER — SODIUM CHLORIDE 0.9% FLUSH
10.0000 mL | INTRAVENOUS | Status: DC | PRN
  Administered 2016-10-03: 10 mL via INTRAVENOUS
  Filled 2016-10-03: qty 10

## 2016-10-03 MED ORDER — HEPARIN SOD (PORK) LOCK FLUSH 100 UNIT/ML IV SOLN
500.0000 [IU] | Freq: Once | INTRAVENOUS | Status: DC | PRN
  Filled 2016-10-03: qty 5

## 2016-10-03 MED ORDER — ALTEPLASE 2 MG IJ SOLR
2.0000 mg | Freq: Once | INTRAMUSCULAR | Status: AC | PRN
  Administered 2016-10-03: 2 mg
  Filled 2016-10-03: qty 2

## 2016-10-03 NOTE — Progress Notes (Signed)
Checked Cathflo 49min post administration. Great blood return. Wasted 10cc of blood prior to blood collection. De accessed after without any complication.

## 2016-10-03 NOTE — Patient Instructions (Signed)
Implanted Port Insertion An implanted port is a central line that has a round shape and is placed under the skin. It is used as a long-term IV access for:   Medicines, such as chemotherapy.   Fluids.   Liquid nutrition, such as total parenteral nutrition (TPN).   Blood samples.  LET YOUR HEALTH CARE PROVIDER KNOW ABOUT:  Allergies to food or medicine.   Medicines taken, including vitamins, herbs, eye drops, creams, and over-the-counter medicines.   Any allergies to heparin.  Use of steroids (by mouth or creams).   Previous problems with anesthetics or numbing medicines.   History of bleeding problems or blood clots.   Previous surgery.   Other health problems, including diabetes and kidney problems.   Possibility of pregnancy, if this applies. RISKS AND COMPLICATIONS Generally, this is a safe procedure. However, as with any procedure, problems can occur. Possible problems include:  Damage to the blood vessel, bruising, or bleeding at the puncture site.   Infection.  Blood clot in the vessel that the port is in.  Breakdown of the skin over your port.  Very rarely a person may develop a condition called a pneumothorax, a collection of air in the chest that may cause one of the lungs to collapse. The placement of these catheters with the appropriate imaging guidance significantly decreases the risk of a pneumothorax.  BEFORE THE PROCEDURE   Your health care provider may want you to have blood tests. These tests can help tell how well your kidneys and liver are working. They can also show how well your blood clots.   If you take blood thinners (anticoagulant medicines), ask your health care provider when you should stop taking them.   Make arrangements for someone to drive you home. This is necessary if you have been sedated for your procedure.  PROCEDURE  Port insertion usually takes about 30-45 minutes.   An IV needle will be inserted in your arm.  Medicine for pain and medicine to help relax you (sedative) will flow directly into your body through this needle.   You will lie on an exam table, and you will be connected to monitors to keep track of your heart rate, blood pressure, and breathing throughout the procedure.  An oxygen monitoring device may be attached to your finger. Oxygen will be given.   Everything will be kept as germ free (sterile) as possible during the procedure. The skin near the point of the incision will be cleansed with antiseptic, and the area will be draped with sterile towels. The skin and deeper tissues over the port area will be made numb with a local anesthetic.  Two small cuts (incisions) will be made in the skin to insert the port. One will be made in the neck to obtain access to the vein where the catheter will lie.   Because the port reservoir will be placed under the skin, a small skin incision will be made in the upper chest, and a small pocket for the port will be made under the skin. The catheter that will be connected to the port tunnels to a large central vein in the chest. A small, raised area will remain on your body at the site of the reservoir when the procedure is complete.  The port placement will be done under imaging guidance to ensure the proper placement.  The reservoir has a silicone covering that can be punctured with a special needle.   The port will be flushed with normal   saline, and blood will be drawn to make sure it is working properly.  There will be nothing remaining outside the skin when the procedure is finished.   Incisions will be held together by stitches, surgical glue, or a special tape. AFTER THE PROCEDURE  You will stay in a recovery area until the anesthesia has worn off. Your blood pressure and pulse will be checked.  A final chest X-ray will be taken to check the placement of the port and to ensure that there is no injury to your lung. This information is not  intended to replace advice given to you by your health care provider. Make sure you discuss any questions you have with your health care provider. Document Released: 03/13/2013 Document Revised: 06/13/2014 Document Reviewed: 03/13/2013 Elsevier Interactive Patient Education  2017 [employer]  

## 2016-10-03 NOTE — Assessment & Plan Note (Signed)
This pain is likely post surgical in nature, and I encouraged her to continue taking Tramadol and being as active as she can be.  I checked a CBC today and her WBCs were normal.  Should the pain continue, worsen, or should she develop any other sypmtoms such as fevers, redness, warmth, she should call her surgeon's office for guidance.

## 2016-10-03 NOTE — Telephone Encounter (Signed)
Surgery 3 weeks ago.  Now she has above her incision a real sore spot. It is kind of burning pain. Started about a week ago. Sometimes a little heat. No redness, no swelling.  Right beside cavity where they took out the tumor.  She is taking tramadol.  Her f/u appt is Friday with Thimappa s/w Mendel Ryder and had pt come in for CBC/flush and to see her. inbasket sent, cbc ordered.

## 2016-10-03 NOTE — Progress Notes (Signed)
[facility] Follow up:    Marissa Hall., MD [place] [phone]   DIAGNOSIS: Cancer Staging Breast cancer of upper-inner quadrant of right female breast [facility]) Staging form: Breast, AJCC 7th Edition - Clinical stage from 03/09/2016: Stage IIA (T2, N0, M0) - Unsigned   SUMMARY OF ONCOLOGIC HISTORY:   Breast cancer of upper-inner quadrant of right female breast ([place])   02/29/2016 Initial Diagnosis    Right breast palpable mass (with silicone implants 5284), 3.5 cm on MRI, additional 3 cm anterior linear enhancement? 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 Ki-67 60%, T2 N0 stage 2A clinical stage      03/14/2016 Procedure    Right breast biopsy upper inner quadrant: IDC grade 3      03/28/2016 -  Neo-Adjuvant Chemotherapy    Neoadjuvant chemotherapy with dose dense Adriamycin and Cytoxan followed by Abraxane weekly 5 ( patient is diabetic and cannot take steroids)      07/15/2016 Breast MRI    Right breast: Spiculated mass 1.5 cm significantly smaller compared to prior,NME previously seen is not noted, no abnormal lymph nodes      09/13/2016 Surgery    Removal of the silicone implant due to intracapsular rupture and capsulectomy (Dr.Thimappa)      09/13/2016 Surgery    Right lumpectomy: IDC grade 3, 2 foci, 2 cm and 1.1 cm, 0/3 lymph nodes negative margins negative, ER 0%, PR 0%, HER-2 negative ratio 1.02, Ki-67 60%, RCB-II; ypT2ypN0 Stage 2A        CURRENT THERAPY: awaiting radiation  INTERVAL HISTORY: Marissa Hall 76 y.o. female returns for evaluation of left breast pain.  She undwerwent lumpectomy and left implant removal 3 weeks ago and over the past 4 days she has had noted an increase in pain in her upper inner right breast.  The pain is constant and she is taking Tramadol every 12 hours to help alleviate it.  She denies any redness or increase in swelling.  She denies any fevers or chills.  She does have  tenderness and walking or wearing bras make the pain worse.  Pain ranges from 5-8/10.     Patient Active Problem List   Diagnosis Date Noted  . Breast pain, right 10/03/2016  . Port catheter in place 05/09/2016  . Peripheral neuropathy 05/09/2016  . Constipation 04/19/2016  . Breast cancer of upper-inner quadrant of right female breast ([facility]) 03/09/2016    is allergic to other and penicillins.  MEDICAL HISTORY: Past Medical History:  Diagnosis Date  . Anxiety   . Breast cancer ([place])   . Depression   . Diabetes ([facility])   . GERD (gastroesophageal reflux disease)   . Hypertension   . Hypothyroidism   . Neuromuscular disorder ([place])    peripheral neuropathy  . Neuropathy    feet  . TIA (transient ischemic attack)     SURGICAL HISTORY: Past Surgical History:  Procedure Laterality Date  . ABDOMINAL HYSTERECTOMY  1972  . APPENDECTOMY  1972  . BREAST ENHANCEMENT SURGERY  1982  . BREAST IMPLANT REMOVAL Right 09/13/2016   Procedure: REMOVAL RIGHT BREAST IMPLANT;  Surgeon: Irene Limbo, MD;  Location: [facility];  Service: Plastics;  Laterality: Right;  . BREAST LUMPECTOMY WITH RADIOACTIVE SEED AND SENTINEL LYMPH NODE BIOPSY Right 09/13/2016   Procedure: RIGHT BREAST LUMPECTOMY WITH RADIOACTIVE SEED X 2 AND SENTINEL LYMPH NODE BIOPSY;  Surgeon: Alphonsa Overall, MD;  Location: [facility];  Service: General;  Laterality: Right;  . BREAST SURGERY     breast biopsy  . CAPSULECTOMY Right 09/13/2016   Procedure: RIGHT CAPSULECTOMY;  Surgeon: Irene Limbo, MD;  Location: [facility];  Service: Plastics;  Laterality: Right;  . PORTACATH PLACEMENT N/A 03/15/2016   Procedure: INSERTION PORT-A-CATH WITH [place];  Surgeon: Alphonsa Overall, MD;  [facility];  Service: General;  Laterality: N/A;  . surgical repair left ankle  2009    SOCIAL HISTORY: Social History   Social History  . Marital status: Legally Separated    Spouse name: N/A  . Number  of children: N/A  . Years of education: N/A   Occupational History  . Not on file.   Social History Main Topics  . Smoking status: Former Smoker    Packs/day: 1.00    Types: Cigarettes    Quit date: 03/08/1999  . Smokeless tobacco: Never Used  . Alcohol use Yes     Comment: wine daily  . Drug use: No  . Sexual activity: Not on file   Other Topics Concern  . Not on file   Social History Narrative  . No narrative on file    FAMILY HISTORY: No family history on file.  Review of Systems  Constitutional: Positive for fatigue. Negative for appetite change, chills, diaphoresis, fever and unexpected weight change.  HENT:   Negative for hearing loss, lump/mass, mouth sores, nosebleeds and sore throat.   Eyes: Negative for eye problems and icterus.  Respiratory: Negative for chest tightness and cough.   Cardiovascular: Negative for chest pain and leg swelling.  Gastrointestinal: Negative for abdominal distention and abdominal pain.  Musculoskeletal: Negative for gait problem.  Neurological: Negative for dizziness, gait problem and light-headedness.      PHYSICAL EXAMINATION  ECOG PERFORMANCE STATUS: 1 - Symptomatic but completely ambulatory  Vitals:   10/03/16 1458  BP: (!) 157/49  Pulse: 62  Resp: 18  Temp: 98 F (36.7 C)    Physical Exam  Constitutional: She is oriented to person, place, and time and well-developed, well-nourished, and in no distress.  HENT:  Head: Normocephalic and atraumatic.  Mouth/Throat: No oropharyngeal exudate.  Eyes: Pupils are equal, round, and reactive to light. No scleral icterus.  Neck: Neck supple.  Cardiovascular: Normal rate, regular rhythm, normal heart sounds and intact distal pulses.   Pulmonary/Chest: Effort normal and breath sounds normal. No respiratory distress. She has no wheezes.  Right breast s/p lumpectomy, healing well, some very mild firmness and tenderness to right upper inner quadrant, however no erythema, or warmth  noted.    Abdominal: Soft. Bowel sounds are normal.  Lymphadenopathy:    She has no cervical adenopathy.  Neurological: She is alert and oriented to person, place, and time.  Skin: Skin is warm and dry. No erythema.  Psychiatric: Mood and affect normal.    LABORATORY DATA:  CBC    Component Value Date/Time   WBC 4.9 10/03/2016 1406   WBC 4.4 03/16/2016 0506   RBC 3.92 10/03/2016 1406   RBC 4.15 03/16/2016 0506   HGB 12.5 10/03/2016 1406   HCT 37.6 10/03/2016 1406   PLT 161 10/03/2016 1406   MCV 95.9 10/03/2016 1406   MCH 31.9 10/03/2016 1406   MCH 32.3 03/16/2016 0506   MCHC 33.2 10/03/2016 1406   MCHC 34.2 03/16/2016 0506   RDW 13.9 10/03/2016 1406   LYMPHSABS 1.6 10/03/2016 1406   MONOABS 0.3 10/03/2016 1406   EOSABS 0.1 10/03/2016 1406   BASOSABS  0.0 10/03/2016 1406    CMP     Component Value Date/Time   NA 139 09/07/2016 1320   NA 140 07/11/2016 1100   K 4.4 09/07/2016 1320   K 4.1 07/11/2016 1100   CL 102 09/07/2016 1320   CO2 29 09/07/2016 1320   CO2 25 07/11/2016 1100   GLUCOSE 157 (H) 09/07/2016 1320   GLUCOSE 181 (H) 07/11/2016 1100   BUN 12 09/07/2016 1320   BUN 14.9 07/11/2016 1100   CREATININE 0.81 09/07/2016 1320   CREATININE 0.8 07/11/2016 1100   CALCIUM 9.6 09/07/2016 1320   CALCIUM 9.4 07/11/2016 1100   PROT 6.6 07/11/2016 1100   ALBUMIN 3.9 07/11/2016 1100   AST 28 07/11/2016 1100   ALT 31 07/11/2016 1100   ALKPHOS 88 07/11/2016 1100   BILITOT 0.86 07/11/2016 1100   GFRNONAA >60 09/07/2016 1320   GFRAA >60 09/07/2016 1320       ASSESSMENT and PLAN:   Breast cancer of upper-inner quadrant of right female breast ([place]) Richelle is s/p surgery and is awaiting scheduling with radiation oncology.    Breast pain, right This pain is likely post surgical in nature, and I encouraged her to continue taking Tramadol and being as active as she can be.  I checked a CBC today and her WBCs were normal.  Should the pain continue, worsen, or should  she develop any other sypmtoms such as fevers, redness, warmth, she should call her surgeon's office for guidance.      A total of (30) minutes of face-to-face time was spent with this patient with greater than 50% of that time in counseling and care-coordination.  All questions were answered. The patient knows to call the clinic with any problems, questions or concerns. We can certainly see the patient much sooner if necessary.   Scot Dock, NP 10/03/2016

## 2016-10-03 NOTE — Progress Notes (Signed)
Pt's Port-A-Cath accessed. Site flushes well, no swelling or pain noted on flushing. No blood return noted. Flushed with 30 ml of NS, no swelling or pain noted. Cath-Flow instilled per policy. Will monitor for blood return. Site marked with label. May, RN at [employer] of findings.

## 2016-10-03 NOTE — Assessment & Plan Note (Signed)
Marissa Hall is s/p surgery and is awaiting scheduling with radiation oncology.

## 2016-10-04 ENCOUNTER — Telehealth: Payer: Self-pay | Admitting: *Deleted

## 2016-10-04 NOTE — Telephone Encounter (Signed)
Called the patient and informed her she start her Xeloda after her radiation is completed, per Shona Simpson' and Dr. Geralyn Flash original note telling her this, patient thanked this Rn for the call 9:05 AM

## 2016-10-05 ENCOUNTER — Ambulatory Visit: Admission: RE | Admit: 2016-10-05 | Payer: Medicare Other | Source: Ambulatory Visit | Admitting: Radiation Oncology

## 2016-10-12 ENCOUNTER — Ambulatory Visit
Admission: RE | Admit: 2016-10-12 | Discharge: 2016-10-12 | Disposition: A | Discharge status: Home / Self Care | Payer: Medicare Other | Source: Ambulatory Visit | Attending: Radiation Oncology | Admitting: Radiation Oncology

## 2016-10-12 DIAGNOSIS — C50211 Malignant neoplasm of upper-inner quadrant of right female breast: Secondary | ICD-10-CM

## 2016-10-12 DIAGNOSIS — Z171 Estrogen receptor negative status [ER-]: Principal | ICD-10-CM

## 2016-10-12 DIAGNOSIS — Z9221 Personal history of antineoplastic chemotherapy: Secondary | ICD-10-CM | POA: Diagnosis not present

## 2016-10-12 DIAGNOSIS — F419 Anxiety disorder, unspecified: Secondary | ICD-10-CM | POA: Diagnosis not present

## 2016-10-12 DIAGNOSIS — F329 Major depressive disorder, single episode, unspecified: Secondary | ICD-10-CM | POA: Diagnosis not present

## 2016-10-12 DIAGNOSIS — Z510 Encounter for antineoplastic radiation therapy: Secondary | ICD-10-CM | POA: Diagnosis not present

## 2016-10-12 DIAGNOSIS — C50111 Malignant neoplasm of central portion of right female breast: Secondary | ICD-10-CM | POA: Diagnosis not present

## 2016-10-14 NOTE — Progress Notes (Signed)
  Radiation Oncology         (336) [phone] ________________________________  Name: Tatum Corl MRN: [id number]  Date: 10/12/2016  DOB: 1940/08/14  DIAGNOSIS:     ICD-9-CM ICD-10-CM   1. Malignant neoplasm of upper-inner quadrant of right breast in female, estrogen receptor negative ([facility]) 174.2 C50.211    V86.1 Z17.1      SIMULATION AND TREATMENT PLANNING NOTE  The patient presented for simulation prior to beginning her course of radiation treatment for her diagnosis of Right-sided breast cancer. The patient was placed in a supine position on a breast board. A customized vac-lock bag was constructed and this complex treatment device will be used on a daily basis during her treatment. In this fashion, a CT scan was obtained through the chest area and an isocenter was placed near the chest wall within the breast.  The patient will be planned to receive a course of radiation initially to a dose of 50.4 Gy. This will consist of a whole breast radiotherapy technique. To accomplish this, 2 customized blocks have been designed which will correspond to medial and lateral whole breast tangent fields. This treatment will be accomplished at 1.8 Gy per fraction. A forward planning technique will also be evaluated to determine if this approach improves the plan. It is anticipated that the patient will then receive a 10 Gy boost to the seroma cavity which has been contoured. This will be accomplished at 2 Gy per fraction.   This initial treatment will consist of a 3-D conformal technique. The seroma has been contoured as the primary target structure. Additionally, dose volume histograms of both this target as well as the lungs and heart will also be evaluated. Such an approach is necessary to ensure that the target area is adequately covered while the nearby critical  normal structures are adequately spared.  Plan:  The final anticipated total dose therefore will correspond to 60.4  Gy.    _______________________________   Jodelle Gross, MD, PhD

## 2016-10-14 NOTE — Progress Notes (Signed)
  Radiation Oncology         (336) [phone] ________________________________  Name: Marissa Hall MRN: [id number]  Date: 10/12/2016  DOB: [birth year]  Optical Surface Tracking Plan:  Since intensity modulated radiotherapy (IMRT) and 3D conformal radiation treatment methods are predicated on accurate and precise positioning for treatment, intrafraction motion monitoring is medically necessary to ensure accurate and safe treatment delivery.  The ability to quantify intrafraction motion without excessive ionizing radiation dose can only be performed with optical surface tracking. Accordingly, surface imaging offers the opportunity to obtain 3D measurements of patient position throughout IMRT and 3D treatments without excessive radiation exposure.  I am ordering optical surface tracking for this patient's upcoming course of radiotherapy. ________________________________  Kyung Rudd, MD 10/14/2016 11:20 AM    Reference:   Ursula Alert, J, et al. Surface imaging-based analysis of intrafraction motion for breast radiotherapy patients.Journal of [place], n. 6, nov. 2014. ISSN [id number].   Available at: <http://www.jacmp.org/index.php/jacmp/article/view/4957>.

## 2016-10-19 ENCOUNTER — Ambulatory Visit
Admission: RE | Admit: 2016-10-19 | Discharge: 2016-10-19 | Disposition: A | Discharge status: Home / Self Care | Payer: Medicare Other | Source: Ambulatory Visit | Attending: Radiation Oncology | Admitting: Radiation Oncology

## 2016-10-19 DIAGNOSIS — F419 Anxiety disorder, unspecified: Secondary | ICD-10-CM | POA: Diagnosis not present

## 2016-10-19 DIAGNOSIS — Z9221 Personal history of antineoplastic chemotherapy: Secondary | ICD-10-CM | POA: Diagnosis not present

## 2016-10-19 DIAGNOSIS — Z510 Encounter for antineoplastic radiation therapy: Secondary | ICD-10-CM | POA: Diagnosis not present

## 2016-10-19 DIAGNOSIS — F329 Major depressive disorder, single episode, unspecified: Secondary | ICD-10-CM | POA: Diagnosis not present

## 2016-10-19 DIAGNOSIS — C50111 Malignant neoplasm of central portion of right female breast: Secondary | ICD-10-CM | POA: Diagnosis not present

## 2016-10-19 DIAGNOSIS — Z171 Estrogen receptor negative status [ER-]: Secondary | ICD-10-CM | POA: Diagnosis not present

## 2016-10-20 ENCOUNTER — Ambulatory Visit
Admission: RE | Admit: 2016-10-20 | Discharge: 2016-10-20 | Disposition: A | Discharge status: Home / Self Care | Payer: Medicare Other | Source: Ambulatory Visit | Attending: Radiation Oncology | Admitting: Radiation Oncology

## 2016-10-20 DIAGNOSIS — F329 Major depressive disorder, single episode, unspecified: Secondary | ICD-10-CM | POA: Diagnosis not present

## 2016-10-20 DIAGNOSIS — Z510 Encounter for antineoplastic radiation therapy: Secondary | ICD-10-CM | POA: Diagnosis not present

## 2016-10-20 DIAGNOSIS — Z9221 Personal history of antineoplastic chemotherapy: Secondary | ICD-10-CM | POA: Diagnosis not present

## 2016-10-20 DIAGNOSIS — F419 Anxiety disorder, unspecified: Secondary | ICD-10-CM | POA: Diagnosis not present

## 2016-10-20 DIAGNOSIS — Z171 Estrogen receptor negative status [ER-]: Secondary | ICD-10-CM | POA: Diagnosis not present

## 2016-10-20 DIAGNOSIS — C50111 Malignant neoplasm of central portion of right female breast: Secondary | ICD-10-CM | POA: Diagnosis not present

## 2016-10-20 DIAGNOSIS — C50211 Malignant neoplasm of upper-inner quadrant of right female breast: Secondary | ICD-10-CM

## 2016-10-20 MED ORDER — ALRA NON-METALLIC DEODORANT (RAD-ONC)
1.0000 "application " | Freq: Once | TOPICAL | Status: AC
  Administered 2016-10-20: 1 via TOPICAL

## 2016-10-20 MED ORDER — RADIAPLEXRX EX GEL
Freq: Once | CUTANEOUS | Status: AC
  Administered 2016-10-20: 17:00:00 via TOPICAL

## 2016-10-20 NOTE — Progress Notes (Signed)
Patient education done, my business card, radiation therapy and you book, alra deodorant, radiaplex gel , discussed ways to manage skin irritation,sweling of breast/tenderness, ,no rubbing,scrubbing or scratching breast, luke warm showers, pat dry, apply cream after rad txs daily and in am after shower or bath, use of electric shaver only, dove soap unscented preferred, , no under wire bras, increase protein in diet and stay hydrated,drink plenty water,  Sees MD weekly and prnteach back given 5:07 PM

## 2016-10-21 ENCOUNTER — Ambulatory Visit
Admission: RE | Admit: 2016-10-21 | Discharge: 2016-10-21 | Disposition: A | Discharge status: Home / Self Care | Payer: Medicare Other | Source: Ambulatory Visit | Attending: Radiation Oncology | Admitting: Radiation Oncology

## 2016-10-21 ENCOUNTER — Telehealth: Payer: Self-pay | Admitting: *Deleted

## 2016-10-21 DIAGNOSIS — Z171 Estrogen receptor negative status [ER-]: Secondary | ICD-10-CM | POA: Diagnosis not present

## 2016-10-21 DIAGNOSIS — F419 Anxiety disorder, unspecified: Secondary | ICD-10-CM | POA: Diagnosis not present

## 2016-10-21 DIAGNOSIS — Z510 Encounter for antineoplastic radiation therapy: Secondary | ICD-10-CM | POA: Diagnosis not present

## 2016-10-21 DIAGNOSIS — F329 Major depressive disorder, single episode, unspecified: Secondary | ICD-10-CM | POA: Diagnosis not present

## 2016-10-21 DIAGNOSIS — C50111 Malignant neoplasm of central portion of right female breast: Secondary | ICD-10-CM | POA: Diagnosis not present

## 2016-10-21 DIAGNOSIS — Z9221 Personal history of antineoplastic chemotherapy: Secondary | ICD-10-CM | POA: Diagnosis not present

## 2016-10-21 NOTE — Telephone Encounter (Signed)
Received call from patient stating she has had some depression with no suicidal thoughts, but would like to establish with a primary care for anti -depressant medications possibly.  Gave the information for [facility] primary care and also the number for [facility] in [place] which will take walk ins for pyschiatric care.  Encouraged her to call with any needs or concerns.

## 2016-10-24 ENCOUNTER — Ambulatory Visit
Admission: RE | Admit: 2016-10-24 | Discharge: 2016-10-24 | Disposition: A | Discharge status: Home / Self Care | Payer: Medicare Other | Source: Ambulatory Visit | Attending: Radiation Oncology | Admitting: Radiation Oncology

## 2016-10-24 DIAGNOSIS — F329 Major depressive disorder, single episode, unspecified: Secondary | ICD-10-CM | POA: Diagnosis not present

## 2016-10-24 DIAGNOSIS — F419 Anxiety disorder, unspecified: Secondary | ICD-10-CM | POA: Diagnosis not present

## 2016-10-24 DIAGNOSIS — Z510 Encounter for antineoplastic radiation therapy: Secondary | ICD-10-CM | POA: Diagnosis not present

## 2016-10-24 DIAGNOSIS — Z171 Estrogen receptor negative status [ER-]: Secondary | ICD-10-CM | POA: Diagnosis not present

## 2016-10-24 DIAGNOSIS — Z9221 Personal history of antineoplastic chemotherapy: Secondary | ICD-10-CM | POA: Diagnosis not present

## 2016-10-24 DIAGNOSIS — C50111 Malignant neoplasm of central portion of right female breast: Secondary | ICD-10-CM | POA: Diagnosis not present

## 2016-10-25 ENCOUNTER — Ambulatory Visit
Admission: RE | Admit: 2016-10-25 | Discharge: 2016-10-25 | Disposition: A | Discharge status: Home / Self Care | Payer: Medicare Other | Source: Ambulatory Visit | Attending: Radiation Oncology | Admitting: Radiation Oncology

## 2016-10-25 DIAGNOSIS — F329 Major depressive disorder, single episode, unspecified: Secondary | ICD-10-CM | POA: Diagnosis not present

## 2016-10-25 DIAGNOSIS — Z9221 Personal history of antineoplastic chemotherapy: Secondary | ICD-10-CM | POA: Diagnosis not present

## 2016-10-25 DIAGNOSIS — Z510 Encounter for antineoplastic radiation therapy: Secondary | ICD-10-CM | POA: Diagnosis not present

## 2016-10-25 DIAGNOSIS — C50111 Malignant neoplasm of central portion of right female breast: Secondary | ICD-10-CM | POA: Diagnosis not present

## 2016-10-25 DIAGNOSIS — Z171 Estrogen receptor negative status [ER-]: Secondary | ICD-10-CM | POA: Diagnosis not present

## 2016-10-25 DIAGNOSIS — F419 Anxiety disorder, unspecified: Secondary | ICD-10-CM | POA: Diagnosis not present

## 2016-10-26 ENCOUNTER — Ambulatory Visit
Admission: RE | Admit: 2016-10-26 | Discharge: 2016-10-26 | Disposition: A | Discharge status: Home / Self Care | Payer: Medicare Other | Source: Ambulatory Visit | Attending: Radiation Oncology | Admitting: Radiation Oncology

## 2016-10-26 DIAGNOSIS — C50111 Malignant neoplasm of central portion of right female breast: Secondary | ICD-10-CM | POA: Diagnosis not present

## 2016-10-26 DIAGNOSIS — Z9221 Personal history of antineoplastic chemotherapy: Secondary | ICD-10-CM | POA: Diagnosis not present

## 2016-10-26 DIAGNOSIS — F329 Major depressive disorder, single episode, unspecified: Secondary | ICD-10-CM | POA: Diagnosis not present

## 2016-10-26 DIAGNOSIS — F419 Anxiety disorder, unspecified: Secondary | ICD-10-CM | POA: Diagnosis not present

## 2016-10-26 DIAGNOSIS — Z510 Encounter for antineoplastic radiation therapy: Secondary | ICD-10-CM | POA: Diagnosis not present

## 2016-10-26 DIAGNOSIS — Z171 Estrogen receptor negative status [ER-]: Secondary | ICD-10-CM | POA: Diagnosis not present

## 2016-10-26 NOTE — Progress Notes (Signed)
[facility] Counseling Note  Counselor called patient to see if pt wanted to schedule another counseling appointment. LVM on home phone.   Westly Pam, [place] LP[facility] [place]

## 2016-10-27 ENCOUNTER — Ambulatory Visit
Admission: RE | Admit: 2016-10-27 | Discharge: 2016-10-27 | Disposition: A | Discharge status: Home / Self Care | Payer: Medicare Other | Source: Ambulatory Visit | Attending: Radiation Oncology | Admitting: Radiation Oncology

## 2016-10-27 DIAGNOSIS — Z510 Encounter for antineoplastic radiation therapy: Secondary | ICD-10-CM | POA: Diagnosis not present

## 2016-10-27 DIAGNOSIS — F329 Major depressive disorder, single episode, unspecified: Secondary | ICD-10-CM | POA: Diagnosis not present

## 2016-10-27 DIAGNOSIS — Z9221 Personal history of antineoplastic chemotherapy: Secondary | ICD-10-CM | POA: Diagnosis not present

## 2016-10-27 DIAGNOSIS — Z171 Estrogen receptor negative status [ER-]: Secondary | ICD-10-CM | POA: Diagnosis not present

## 2016-10-27 DIAGNOSIS — F419 Anxiety disorder, unspecified: Secondary | ICD-10-CM | POA: Diagnosis not present

## 2016-10-27 DIAGNOSIS — C50111 Malignant neoplasm of central portion of right female breast: Secondary | ICD-10-CM | POA: Diagnosis not present

## 2016-10-28 ENCOUNTER — Encounter: Payer: Self-pay | Admitting: Family Medicine

## 2016-10-28 ENCOUNTER — Ambulatory Visit (INDEPENDENT_AMBULATORY_CARE_PROVIDER_SITE_OTHER): Payer: Medicare Other | Admitting: Family Medicine

## 2016-10-28 ENCOUNTER — Ambulatory Visit
Admission: RE | Admit: 2016-10-28 | Discharge: 2016-10-28 | Disposition: A | Discharge status: Home / Self Care | Payer: Medicare Other | Source: Ambulatory Visit | Attending: Radiation Oncology | Admitting: Radiation Oncology

## 2016-10-28 VITALS — BP 141/74 | HR 53 | Temp 97.8°F | Resp 16 | Ht 68.5 in | Wt 174.0 lb

## 2016-10-28 DIAGNOSIS — F411 Generalized anxiety disorder: Secondary | ICD-10-CM | POA: Diagnosis not present

## 2016-10-28 DIAGNOSIS — Z171 Estrogen receptor negative status [ER-]: Secondary | ICD-10-CM | POA: Diagnosis not present

## 2016-10-28 DIAGNOSIS — Z9221 Personal history of antineoplastic chemotherapy: Secondary | ICD-10-CM | POA: Diagnosis not present

## 2016-10-28 DIAGNOSIS — Z510 Encounter for antineoplastic radiation therapy: Secondary | ICD-10-CM | POA: Diagnosis not present

## 2016-10-28 DIAGNOSIS — F330 Major depressive disorder, recurrent, mild: Secondary | ICD-10-CM

## 2016-10-28 DIAGNOSIS — F329 Major depressive disorder, single episode, unspecified: Secondary | ICD-10-CM | POA: Diagnosis not present

## 2016-10-28 DIAGNOSIS — C50111 Malignant neoplasm of central portion of right female breast: Secondary | ICD-10-CM | POA: Diagnosis not present

## 2016-10-28 DIAGNOSIS — F419 Anxiety disorder, unspecified: Secondary | ICD-10-CM | POA: Diagnosis not present

## 2016-10-28 MED ORDER — DULOXETINE HCL 30 MG PO CPEP: 30.0000 mg | ORAL_CAPSULE | Freq: Every day | ORAL | 1 refills | Status: DC

## 2016-10-28 NOTE — Patient Instructions (Signed)
Psychologist:  Doroteo Glassman, PhD Ph: [phone], [place], [place] [address].

## 2016-10-28 NOTE — Progress Notes (Signed)
Office Note 10/28/2016  CC:  Chief Complaint  Patient presents with  . Establish Care    HPI:  Marissa Hall is a 76 y.o. female who is here to establish care and discuss depression. Patient's most recent primary MD: Dr. Hartford Poli --endo in [place]. Old records in EPIC/HL EMR were reviewed prior to or during today's visit.  Started radiation therapy for breast ca 1.5 wks ago.  Complaint today: hx of GAD and panic attacks.  Has had chronically fatigued and had poor mood for about 1 yr.  Currently lives alone, spends a lot of time by herself, describes depressed mood.  Doesn't enjoy social interaction, also lack of enjoyment of things she used to enjoy.  Feels emotional blunting---"I wish I could cry".  "I'm crying on the inside".  No hx of SI or HI, none currently as well. Initial and maintenance insomnia.   Lorazepam in past prn for anxiety related to recent chemo treatments. Has been on LOTS of antidepressants in the past.  Most recent was effexor xr and lamictal--in 2010 when underwent separation from husband.  Recalls being a bit more improved on this combo than single antidepressants in the past. Buspar no help in the past for anxiety. She admits all sx's seem to be worse since dx of breast ca 03/2016. Counseling helped in the past (about 6-8 yrs ago).  Past Medical History:  Diagnosis Date  . Anxiety and depression   . Breast cancer ([place])    Clinical stage 2A: (triple neg): Right breast, upper inner quadrant, 03/2016.  Neoadjuvant chemo x 5 cycles, then lumpectomy 4 mo later, then started radiation 10/2016.  . Depression   . Diabetes ([place])    with PN-managed by endo.  [name] Kitchen GERD (gastroesophageal reflux disease)   . Hypertension   . Hypothyroidism    managed by endo.  [name] Kitchen Neuropathy    feet  . TIA (transient ischemic attack)     Past Surgical History:  Procedure Laterality Date  . ABDOMINAL HYSTERECTOMY  1972  . APPENDECTOMY  1972  . BREAST ENHANCEMENT SURGERY  1982   . BREAST IMPLANT REMOVAL Right 09/13/2016   Procedure: REMOVAL RIGHT BREAST IMPLANT;  Surgeon: Irene Limbo, MD;  Location: [facility];  Service: Plastics;  Laterality: Right;  . BREAST LUMPECTOMY WITH RADIOACTIVE SEED AND SENTINEL LYMPH NODE BIOPSY Right 09/13/2016   Procedure: RIGHT BREAST LUMPECTOMY WITH RADIOACTIVE SEED X 2 AND SENTINEL LYMPH NODE BIOPSY;  Surgeon: Alphonsa Overall, MD;  Location: [facility];  Service: General;  Laterality: Right;  . BREAST SURGERY     breast biopsy  . CAPSULECTOMY Right 09/13/2016   Procedure: RIGHT CAPSULECTOMY;  Surgeon: Irene Limbo, MD;  Location: [facility];  Service: Plastics;  Laterality: Right;  . PORTACATH PLACEMENT N/A 03/15/2016   Procedure: INSERTION PORT-A-CATH WITH [place];  Surgeon: Alphonsa Overall, MD;  Location: [facility];  Service: General;  Laterality: N/A;  . surgical repair left ankle  2009  . TONSILLECTOMY AND ADENOIDECTOMY     REMOTE past    Family History  Problem Relation Age of Onset  . Stroke Mother   . Suicidality Father   . Stroke Brother   . Stroke Son     Social History   Social History  . Marital status: Legally Separated    Spouse name: N/A  . Number of children: N/A  . Years of education: N/A   Occupational History  . Not on file.   Social History Main Topics  .  Smoking status: Former Smoker    Packs/day: 1.00    Types: Cigarettes    Quit date: 03/08/1999  . Smokeless tobacco: Never Used  . Alcohol use Yes     Comment: wine daily  . Drug use: No  . Sexual activity: Not on file   Other Topics Concern  . Not on file   Social History Narrative   Divorced, has a son who lives in [place].   Educ: college   Occup: Optometrist, still working part time.   Tob: quit 2000.     Alc: several times a week--1-2 glasses wine.   No drugs.    Outpatient Encounter Prescriptions as of 10/28/2016  Medication Sig  . atorvastatin (LIPITOR) 80 MG tablet Take 80 mg by mouth every  evening.   . gabapentin (NEURONTIN) 100 MG capsule Take 300 mg by mouth at bedtime.   . hyaluronate sodium (RADIAPLEXRX) GEL Apply 1 application topically 2 (two) times daily.  [name] Kitchen levothyroxine (SYNTHROID, LEVOTHROID) 100 MCG tablet Take 100 mcg by mouth daily before breakfast.   . lisinopril (PRINIVIL,ZESTRIL) 20 MG tablet Take 20 mg by mouth every evening.   . metFORMIN (GLUCOPHAGE-XR) 500 MG 24 hr tablet Take 1,000 mg by mouth daily with supper.   . pyridoxine (B-6) 100 MG tablet Take 100 mg by mouth.  . Vitamin D, Ergocalciferol, (DRISDOL) 50000 units CAPS capsule Take 50,000 Units by mouth every 7 (seven) days. Saturdays  . azelastine (ASTELIN) 0.1 % nasal spray Place 2 sprays into both nostrils daily as needed for rhinitis or allergies. Use in each nostril as directed  . DULoxetine (CYMBALTA) 30 MG capsule Take 1 capsule (30 mg total) by mouth daily.  . magnesium oxide (MAG-OX) 400 (241.3 Mg) MG tablet Take 1.5 tablets (600 mg total) by mouth daily. (Patient not taking: Reported on 10/28/2016)  . non-metallic deodorant (ALRA) MISC Apply 1 application topically.  [name] Kitchen omeprazole (PRILOSEC) 40 MG capsule Take 1 capsule (40 mg total) by mouth daily. (Patient not taking: Reported on 09/28/2016)   No facility-administered encounter medications on file as of 10/28/2016.     Allergies  Allergen Reactions  . Other Other (See Comments)    STEROIDS- emotional  . Penicillins Other (See Comments)    Unsure of reaction, was 76 years old    ROS Review of Systems  Constitutional: Positive for fatigue. Negative for fever.  HENT: Negative for congestion and sore throat.   Eyes: Negative for visual disturbance.  Respiratory: Negative for cough.   Cardiovascular: Negative for chest pain.  Gastrointestinal: Negative for abdominal pain and nausea.  Genitourinary: Negative for dysuria.  Musculoskeletal: Negative for back pain and joint swelling.  Skin: Negative for rash.  Neurological: Negative for  weakness and headaches.  Hematological: Negative for adenopathy.    PE; Blood pressure (!) 141/74, pulse (!) 53, temperature 97.8 F (36.6 C), temperature source Oral, resp. rate 16, height 5' 8.5" (1.74 m), weight 174 lb (78.9 kg), SpO2 99 %. Gen: Alert, well appearing.  Patient is oriented to person, place, time, and situation. AFFECT: pleasant, lucid thought and speech. QPR:FFMB: no injection, icteris, swelling, or exudate.  EOMI, PERRLA. Mouth: lips without lesion/swelling.  Oral mucosa pink and moist. Oropharynx without erythema, exudate, or swelling. Neck - No masses or thyromegaly or limitation in range of motion  CV: RRR, no m/r/g.   LUNGS: CTA bilat, nonlabored resps, good aeration in all lung fields. EXT: no clubbing, cyanosis, or edema.   Pertinent labs:   Lab Results  Component Value Date   WBC 4.9 10/03/2016   HGB 12.5 10/03/2016   HCT 37.6 10/03/2016   MCV 95.9 10/03/2016   PLT 161 10/03/2016   Lab Results  Component Value Date   CREATININE 0.81 09/07/2016   BUN 12 09/07/2016   NA 139 09/07/2016   K 4.4 09/07/2016   CL 102 09/07/2016   CO2 29 09/07/2016   Lab Results  Component Value Date   ALT 31 07/11/2016   AST 28 07/11/2016   ALKPHOS 88 07/11/2016   BILITOT 0.86 07/11/2016    ASSESSMENT AND PLAN:   New pt; obtain records from endo, Dr. Hartford Poli  1) MDD, recurrent--mild episode.  With GAD.  Worsened since dx of breast ca 03/2016. Start duloxetine 30mg  qd.  Pt wants to avoid use of benzos and I agree with this. Therapeutic expectations and side effect profile of medication discussed today.  Patient's questions answered.  Counselor info given: Psychologist: Doroteo Glassman, PhD Ph: [phone], [place], [place] [address].  Spent 30 min with pt today, with >50% of this time spent in counseling and care coordination regarding the above problems.  Discussed anxiety and depression sx's, usual course without and with treatments, common  pitfalls of treatments, counseling options, and need to contact me if she starts to get worse after starting the medication.  An After Visit Summary was printed and given to the patient.  Return in about 4 weeks (around 11/25/2016) for f/u anx/mood/med.  Signed:  Crissie Sickles, MD           10/28/2016

## 2016-11-01 ENCOUNTER — Ambulatory Visit
Admission: RE | Admit: 2016-11-01 | Discharge: 2016-11-01 | Disposition: A | Discharge status: Home / Self Care | Payer: Medicare Other | Source: Ambulatory Visit | Attending: Radiation Oncology | Admitting: Radiation Oncology

## 2016-11-01 DIAGNOSIS — F419 Anxiety disorder, unspecified: Secondary | ICD-10-CM | POA: Diagnosis not present

## 2016-11-01 DIAGNOSIS — Z9221 Personal history of antineoplastic chemotherapy: Secondary | ICD-10-CM | POA: Diagnosis not present

## 2016-11-01 DIAGNOSIS — Z510 Encounter for antineoplastic radiation therapy: Secondary | ICD-10-CM | POA: Diagnosis not present

## 2016-11-01 DIAGNOSIS — Z171 Estrogen receptor negative status [ER-]: Secondary | ICD-10-CM | POA: Diagnosis not present

## 2016-11-01 DIAGNOSIS — C50111 Malignant neoplasm of central portion of right female breast: Secondary | ICD-10-CM | POA: Diagnosis not present

## 2016-11-01 DIAGNOSIS — F329 Major depressive disorder, single episode, unspecified: Secondary | ICD-10-CM | POA: Diagnosis not present

## 2016-11-02 ENCOUNTER — Ambulatory Visit
Admission: RE | Admit: 2016-11-02 | Discharge: 2016-11-02 | Disposition: A | Discharge status: Home / Self Care | Payer: Medicare Other | Source: Ambulatory Visit | Attending: Radiation Oncology | Admitting: Radiation Oncology

## 2016-11-02 DIAGNOSIS — F329 Major depressive disorder, single episode, unspecified: Secondary | ICD-10-CM | POA: Diagnosis not present

## 2016-11-02 DIAGNOSIS — F419 Anxiety disorder, unspecified: Secondary | ICD-10-CM | POA: Diagnosis not present

## 2016-11-02 DIAGNOSIS — C50111 Malignant neoplasm of central portion of right female breast: Secondary | ICD-10-CM | POA: Diagnosis not present

## 2016-11-02 DIAGNOSIS — Z510 Encounter for antineoplastic radiation therapy: Secondary | ICD-10-CM | POA: Diagnosis not present

## 2016-11-02 DIAGNOSIS — Z171 Estrogen receptor negative status [ER-]: Secondary | ICD-10-CM | POA: Diagnosis not present

## 2016-11-02 DIAGNOSIS — Z9221 Personal history of antineoplastic chemotherapy: Secondary | ICD-10-CM | POA: Diagnosis not present

## 2016-11-03 ENCOUNTER — Ambulatory Visit
Admission: RE | Admit: 2016-11-03 | Discharge: 2016-11-03 | Disposition: A | Discharge status: Home / Self Care | Payer: Medicare Other | Source: Ambulatory Visit | Attending: Radiation Oncology | Admitting: Radiation Oncology

## 2016-11-03 DIAGNOSIS — Z510 Encounter for antineoplastic radiation therapy: Secondary | ICD-10-CM | POA: Diagnosis not present

## 2016-11-03 DIAGNOSIS — Z171 Estrogen receptor negative status [ER-]: Secondary | ICD-10-CM | POA: Diagnosis not present

## 2016-11-03 DIAGNOSIS — F419 Anxiety disorder, unspecified: Secondary | ICD-10-CM | POA: Diagnosis not present

## 2016-11-03 DIAGNOSIS — F329 Major depressive disorder, single episode, unspecified: Secondary | ICD-10-CM | POA: Diagnosis not present

## 2016-11-03 DIAGNOSIS — C50111 Malignant neoplasm of central portion of right female breast: Secondary | ICD-10-CM | POA: Diagnosis not present

## 2016-11-03 DIAGNOSIS — Z9221 Personal history of antineoplastic chemotherapy: Secondary | ICD-10-CM | POA: Diagnosis not present

## 2016-11-04 ENCOUNTER — Ambulatory Visit
Admission: RE | Admit: 2016-11-04 | Discharge: 2016-11-04 | Disposition: A | Discharge status: Home / Self Care | Payer: Medicare Other | Source: Ambulatory Visit | Attending: Radiation Oncology | Admitting: Radiation Oncology

## 2016-11-04 DIAGNOSIS — F329 Major depressive disorder, single episode, unspecified: Secondary | ICD-10-CM | POA: Diagnosis not present

## 2016-11-04 DIAGNOSIS — Z171 Estrogen receptor negative status [ER-]: Secondary | ICD-10-CM | POA: Diagnosis not present

## 2016-11-04 DIAGNOSIS — Z510 Encounter for antineoplastic radiation therapy: Secondary | ICD-10-CM | POA: Diagnosis not present

## 2016-11-04 DIAGNOSIS — F419 Anxiety disorder, unspecified: Secondary | ICD-10-CM | POA: Diagnosis not present

## 2016-11-04 DIAGNOSIS — C50111 Malignant neoplasm of central portion of right female breast: Secondary | ICD-10-CM | POA: Diagnosis not present

## 2016-11-04 DIAGNOSIS — Z9221 Personal history of antineoplastic chemotherapy: Secondary | ICD-10-CM | POA: Diagnosis not present

## 2016-11-07 ENCOUNTER — Ambulatory Visit (HOSPITAL_BASED_OUTPATIENT_CLINIC_OR_DEPARTMENT_OTHER): Payer: Medicare Other

## 2016-11-07 ENCOUNTER — Ambulatory Visit
Admission: RE | Admit: 2016-11-07 | Discharge: 2016-11-07 | Disposition: A | Discharge status: Home / Self Care | Payer: Medicare Other | Source: Ambulatory Visit | Attending: Radiation Oncology | Admitting: Radiation Oncology

## 2016-11-07 DIAGNOSIS — F329 Major depressive disorder, single episode, unspecified: Secondary | ICD-10-CM | POA: Diagnosis not present

## 2016-11-07 DIAGNOSIS — Z452 Encounter for adjustment and management of vascular access device: Secondary | ICD-10-CM

## 2016-11-07 DIAGNOSIS — C50111 Malignant neoplasm of central portion of right female breast: Secondary | ICD-10-CM | POA: Diagnosis not present

## 2016-11-07 DIAGNOSIS — Z95828 Presence of other vascular implants and grafts: Secondary | ICD-10-CM

## 2016-11-07 DIAGNOSIS — Z171 Estrogen receptor negative status [ER-]: Secondary | ICD-10-CM | POA: Diagnosis not present

## 2016-11-07 DIAGNOSIS — Z9221 Personal history of antineoplastic chemotherapy: Secondary | ICD-10-CM | POA: Diagnosis not present

## 2016-11-07 DIAGNOSIS — C50211 Malignant neoplasm of upper-inner quadrant of right female breast: Secondary | ICD-10-CM

## 2016-11-07 DIAGNOSIS — Z510 Encounter for antineoplastic radiation therapy: Secondary | ICD-10-CM | POA: Diagnosis not present

## 2016-11-07 DIAGNOSIS — F419 Anxiety disorder, unspecified: Secondary | ICD-10-CM | POA: Diagnosis not present

## 2016-11-07 MED ORDER — SODIUM CHLORIDE 0.9% FLUSH
10.0000 mL | INTRAVENOUS | Status: DC | PRN
  Administered 2016-11-07: 10 mL via INTRAVENOUS
  Filled 2016-11-07: qty 10

## 2016-11-07 MED ORDER — HEPARIN SOD (PORK) LOCK FLUSH 100 UNIT/ML IV SOLN
500.0000 [IU] | Freq: Once | INTRAVENOUS | Status: AC | PRN
  Administered 2016-11-07: 500 [IU] via INTRAVENOUS
  Filled 2016-11-07: qty 5

## 2016-11-08 ENCOUNTER — Ambulatory Visit
Admission: RE | Admit: 2016-11-08 | Discharge: 2016-11-08 | Disposition: A | Discharge status: Home / Self Care | Payer: Medicare Other | Source: Ambulatory Visit | Attending: Radiation Oncology | Admitting: Radiation Oncology

## 2016-11-08 DIAGNOSIS — Z9221 Personal history of antineoplastic chemotherapy: Secondary | ICD-10-CM | POA: Diagnosis not present

## 2016-11-08 DIAGNOSIS — Z510 Encounter for antineoplastic radiation therapy: Secondary | ICD-10-CM | POA: Diagnosis not present

## 2016-11-08 DIAGNOSIS — Z171 Estrogen receptor negative status [ER-]: Secondary | ICD-10-CM | POA: Diagnosis not present

## 2016-11-08 DIAGNOSIS — C50111 Malignant neoplasm of central portion of right female breast: Secondary | ICD-10-CM | POA: Diagnosis not present

## 2016-11-08 DIAGNOSIS — F329 Major depressive disorder, single episode, unspecified: Secondary | ICD-10-CM | POA: Diagnosis not present

## 2016-11-08 DIAGNOSIS — F419 Anxiety disorder, unspecified: Secondary | ICD-10-CM | POA: Diagnosis not present

## 2016-11-08 NOTE — Progress Notes (Signed)
[facility] Counseling Note  Counselor called pt to f/u on pt's interest in continuing individual counseling appointments. Pt reported that she has started a new antidepressant which has helped decrease the frequency and intensity of her depressed mood. Counselor and pt agreed that this was wonderful news, and pt stated that she would like to continue with counseling. Counselor and pt scheduled next session for 6/11 at 3:30p.  Westly Pam, [place] [facility] [place] [phone]

## 2016-11-09 ENCOUNTER — Ambulatory Visit
Admission: RE | Admit: 2016-11-09 | Discharge: 2016-11-09 | Disposition: A | Discharge status: Home / Self Care | Payer: Medicare Other | Source: Ambulatory Visit | Attending: Radiation Oncology | Admitting: Radiation Oncology

## 2016-11-09 DIAGNOSIS — Z9221 Personal history of antineoplastic chemotherapy: Secondary | ICD-10-CM | POA: Diagnosis not present

## 2016-11-09 DIAGNOSIS — F329 Major depressive disorder, single episode, unspecified: Secondary | ICD-10-CM | POA: Diagnosis not present

## 2016-11-09 DIAGNOSIS — Z510 Encounter for antineoplastic radiation therapy: Secondary | ICD-10-CM | POA: Diagnosis not present

## 2016-11-09 DIAGNOSIS — C50211 Malignant neoplasm of upper-inner quadrant of right female breast: Secondary | ICD-10-CM

## 2016-11-09 DIAGNOSIS — Z171 Estrogen receptor negative status [ER-]: Secondary | ICD-10-CM | POA: Diagnosis not present

## 2016-11-09 DIAGNOSIS — C50111 Malignant neoplasm of central portion of right female breast: Secondary | ICD-10-CM | POA: Diagnosis not present

## 2016-11-09 DIAGNOSIS — F419 Anxiety disorder, unspecified: Secondary | ICD-10-CM | POA: Diagnosis not present

## 2016-11-09 MED ORDER — RADIAPLEXRX EX GEL
Freq: Once | CUTANEOUS | Status: AC
  Administered 2016-11-09: 16:00:00 via TOPICAL

## 2016-11-10 ENCOUNTER — Ambulatory Visit
Admission: RE | Admit: 2016-11-10 | Discharge: 2016-11-10 | Disposition: A | Discharge status: Home / Self Care | Payer: Medicare Other | Source: Ambulatory Visit | Attending: Radiation Oncology | Admitting: Radiation Oncology

## 2016-11-10 DIAGNOSIS — Z510 Encounter for antineoplastic radiation therapy: Secondary | ICD-10-CM | POA: Diagnosis not present

## 2016-11-10 DIAGNOSIS — F419 Anxiety disorder, unspecified: Secondary | ICD-10-CM | POA: Diagnosis not present

## 2016-11-10 DIAGNOSIS — Z9221 Personal history of antineoplastic chemotherapy: Secondary | ICD-10-CM | POA: Diagnosis not present

## 2016-11-10 DIAGNOSIS — Z171 Estrogen receptor negative status [ER-]: Secondary | ICD-10-CM | POA: Diagnosis not present

## 2016-11-10 DIAGNOSIS — F329 Major depressive disorder, single episode, unspecified: Secondary | ICD-10-CM | POA: Diagnosis not present

## 2016-11-10 DIAGNOSIS — C50111 Malignant neoplasm of central portion of right female breast: Secondary | ICD-10-CM | POA: Diagnosis not present

## 2016-11-11 ENCOUNTER — Ambulatory Visit
Admission: RE | Admit: 2016-11-11 | Discharge: 2016-11-11 | Disposition: A | Discharge status: Home / Self Care | Payer: Medicare Other | Source: Ambulatory Visit | Attending: Radiation Oncology | Admitting: Radiation Oncology

## 2016-11-11 DIAGNOSIS — Z9221 Personal history of antineoplastic chemotherapy: Secondary | ICD-10-CM | POA: Diagnosis not present

## 2016-11-11 DIAGNOSIS — C50111 Malignant neoplasm of central portion of right female breast: Secondary | ICD-10-CM | POA: Diagnosis not present

## 2016-11-11 DIAGNOSIS — Z171 Estrogen receptor negative status [ER-]: Secondary | ICD-10-CM | POA: Diagnosis not present

## 2016-11-11 DIAGNOSIS — F329 Major depressive disorder, single episode, unspecified: Secondary | ICD-10-CM | POA: Diagnosis not present

## 2016-11-11 DIAGNOSIS — Z510 Encounter for antineoplastic radiation therapy: Secondary | ICD-10-CM | POA: Diagnosis not present

## 2016-11-11 DIAGNOSIS — F419 Anxiety disorder, unspecified: Secondary | ICD-10-CM | POA: Diagnosis not present

## 2016-11-14 ENCOUNTER — Ambulatory Visit
Admission: RE | Admit: 2016-11-14 | Discharge: 2016-11-14 | Disposition: A | Discharge status: Home / Self Care | Payer: Medicare Other | Source: Ambulatory Visit | Attending: Radiation Oncology | Admitting: Radiation Oncology

## 2016-11-14 DIAGNOSIS — F329 Major depressive disorder, single episode, unspecified: Secondary | ICD-10-CM | POA: Diagnosis not present

## 2016-11-14 DIAGNOSIS — C50111 Malignant neoplasm of central portion of right female breast: Secondary | ICD-10-CM | POA: Diagnosis not present

## 2016-11-14 DIAGNOSIS — F419 Anxiety disorder, unspecified: Secondary | ICD-10-CM | POA: Diagnosis not present

## 2016-11-14 DIAGNOSIS — Z510 Encounter for antineoplastic radiation therapy: Secondary | ICD-10-CM | POA: Diagnosis not present

## 2016-11-14 DIAGNOSIS — Z9221 Personal history of antineoplastic chemotherapy: Secondary | ICD-10-CM | POA: Diagnosis not present

## 2016-11-14 DIAGNOSIS — Z171 Estrogen receptor negative status [ER-]: Secondary | ICD-10-CM | POA: Diagnosis not present

## 2016-11-15 ENCOUNTER — Ambulatory Visit
Admission: RE | Admit: 2016-11-15 | Discharge: 2016-11-15 | Disposition: A | Discharge status: Home / Self Care | Payer: Medicare Other | Source: Ambulatory Visit | Attending: Radiation Oncology | Admitting: Radiation Oncology

## 2016-11-15 DIAGNOSIS — C50111 Malignant neoplasm of central portion of right female breast: Secondary | ICD-10-CM | POA: Diagnosis not present

## 2016-11-15 DIAGNOSIS — Z9221 Personal history of antineoplastic chemotherapy: Secondary | ICD-10-CM | POA: Diagnosis not present

## 2016-11-15 DIAGNOSIS — F419 Anxiety disorder, unspecified: Secondary | ICD-10-CM | POA: Diagnosis not present

## 2016-11-15 DIAGNOSIS — Z510 Encounter for antineoplastic radiation therapy: Secondary | ICD-10-CM | POA: Diagnosis not present

## 2016-11-15 DIAGNOSIS — F329 Major depressive disorder, single episode, unspecified: Secondary | ICD-10-CM | POA: Diagnosis not present

## 2016-11-15 DIAGNOSIS — Z171 Estrogen receptor negative status [ER-]: Secondary | ICD-10-CM | POA: Diagnosis not present

## 2016-11-16 ENCOUNTER — Ambulatory Visit
Admission: RE | Admit: 2016-11-16 | Discharge: 2016-11-16 | Disposition: A | Discharge status: Home / Self Care | Payer: Medicare Other | Source: Ambulatory Visit | Attending: Radiation Oncology | Admitting: Radiation Oncology

## 2016-11-16 DIAGNOSIS — F419 Anxiety disorder, unspecified: Secondary | ICD-10-CM | POA: Diagnosis not present

## 2016-11-16 DIAGNOSIS — Z510 Encounter for antineoplastic radiation therapy: Secondary | ICD-10-CM | POA: Diagnosis not present

## 2016-11-16 DIAGNOSIS — C50111 Malignant neoplasm of central portion of right female breast: Secondary | ICD-10-CM | POA: Diagnosis not present

## 2016-11-16 DIAGNOSIS — Z171 Estrogen receptor negative status [ER-]: Secondary | ICD-10-CM | POA: Diagnosis not present

## 2016-11-16 DIAGNOSIS — F329 Major depressive disorder, single episode, unspecified: Secondary | ICD-10-CM | POA: Diagnosis not present

## 2016-11-16 DIAGNOSIS — Z9221 Personal history of antineoplastic chemotherapy: Secondary | ICD-10-CM | POA: Diagnosis not present

## 2016-11-17 ENCOUNTER — Ambulatory Visit
Admission: RE | Admit: 2016-11-17 | Discharge: 2016-11-17 | Disposition: A | Discharge status: Home / Self Care | Payer: Medicare Other | Source: Ambulatory Visit | Attending: Radiation Oncology | Admitting: Radiation Oncology

## 2016-11-17 DIAGNOSIS — Z9221 Personal history of antineoplastic chemotherapy: Secondary | ICD-10-CM | POA: Diagnosis not present

## 2016-11-17 DIAGNOSIS — C50111 Malignant neoplasm of central portion of right female breast: Secondary | ICD-10-CM | POA: Diagnosis not present

## 2016-11-17 DIAGNOSIS — Z171 Estrogen receptor negative status [ER-]: Secondary | ICD-10-CM | POA: Diagnosis not present

## 2016-11-17 DIAGNOSIS — F419 Anxiety disorder, unspecified: Secondary | ICD-10-CM | POA: Diagnosis not present

## 2016-11-17 DIAGNOSIS — Z510 Encounter for antineoplastic radiation therapy: Secondary | ICD-10-CM | POA: Diagnosis not present

## 2016-11-17 DIAGNOSIS — F329 Major depressive disorder, single episode, unspecified: Secondary | ICD-10-CM | POA: Diagnosis not present

## 2016-11-18 ENCOUNTER — Ambulatory Visit
Admission: RE | Admit: 2016-11-18 | Discharge: 2016-11-18 | Disposition: A | Discharge status: Home / Self Care | Payer: Medicare Other | Source: Ambulatory Visit | Attending: Radiation Oncology | Admitting: Radiation Oncology

## 2016-11-18 DIAGNOSIS — Z171 Estrogen receptor negative status [ER-]: Secondary | ICD-10-CM | POA: Diagnosis not present

## 2016-11-18 DIAGNOSIS — F329 Major depressive disorder, single episode, unspecified: Secondary | ICD-10-CM | POA: Diagnosis not present

## 2016-11-18 DIAGNOSIS — Z9221 Personal history of antineoplastic chemotherapy: Secondary | ICD-10-CM | POA: Diagnosis not present

## 2016-11-18 DIAGNOSIS — F419 Anxiety disorder, unspecified: Secondary | ICD-10-CM | POA: Diagnosis not present

## 2016-11-18 DIAGNOSIS — Z510 Encounter for antineoplastic radiation therapy: Secondary | ICD-10-CM | POA: Diagnosis not present

## 2016-11-18 DIAGNOSIS — C50111 Malignant neoplasm of central portion of right female breast: Secondary | ICD-10-CM | POA: Diagnosis not present

## 2016-11-21 ENCOUNTER — Ambulatory Visit
Admission: RE | Admit: 2016-11-21 | Discharge: 2016-11-21 | Disposition: A | Discharge status: Home / Self Care | Payer: Medicare Other | Source: Ambulatory Visit | Attending: Radiation Oncology | Admitting: Radiation Oncology

## 2016-11-21 DIAGNOSIS — Z171 Estrogen receptor negative status [ER-]: Secondary | ICD-10-CM | POA: Diagnosis not present

## 2016-11-21 DIAGNOSIS — F329 Major depressive disorder, single episode, unspecified: Secondary | ICD-10-CM | POA: Diagnosis not present

## 2016-11-21 DIAGNOSIS — F419 Anxiety disorder, unspecified: Secondary | ICD-10-CM | POA: Diagnosis not present

## 2016-11-21 DIAGNOSIS — C50111 Malignant neoplasm of central portion of right female breast: Secondary | ICD-10-CM | POA: Diagnosis not present

## 2016-11-21 DIAGNOSIS — Z9221 Personal history of antineoplastic chemotherapy: Secondary | ICD-10-CM | POA: Diagnosis not present

## 2016-11-21 DIAGNOSIS — Z510 Encounter for antineoplastic radiation therapy: Secondary | ICD-10-CM | POA: Diagnosis not present

## 2016-11-22 ENCOUNTER — Ambulatory Visit
Admission: RE | Admit: 2016-11-22 | Discharge: 2016-11-22 | Disposition: A | Discharge status: Home / Self Care | Payer: Medicare Other | Source: Ambulatory Visit | Attending: Radiation Oncology | Admitting: Radiation Oncology

## 2016-11-22 DIAGNOSIS — Z9221 Personal history of antineoplastic chemotherapy: Secondary | ICD-10-CM | POA: Diagnosis not present

## 2016-11-22 DIAGNOSIS — C50111 Malignant neoplasm of central portion of right female breast: Secondary | ICD-10-CM | POA: Diagnosis not present

## 2016-11-22 DIAGNOSIS — F419 Anxiety disorder, unspecified: Secondary | ICD-10-CM | POA: Diagnosis not present

## 2016-11-22 DIAGNOSIS — Z510 Encounter for antineoplastic radiation therapy: Secondary | ICD-10-CM | POA: Diagnosis not present

## 2016-11-22 DIAGNOSIS — F329 Major depressive disorder, single episode, unspecified: Secondary | ICD-10-CM | POA: Diagnosis not present

## 2016-11-22 DIAGNOSIS — Z171 Estrogen receptor negative status [ER-]: Secondary | ICD-10-CM | POA: Diagnosis not present

## 2016-11-23 ENCOUNTER — Ambulatory Visit
Admission: RE | Admit: 2016-11-23 | Discharge: 2016-11-23 | Disposition: A | Discharge status: Home / Self Care | Payer: Medicare Other | Source: Ambulatory Visit | Attending: Radiation Oncology | Admitting: Radiation Oncology

## 2016-11-23 DIAGNOSIS — Z171 Estrogen receptor negative status [ER-]: Secondary | ICD-10-CM | POA: Diagnosis not present

## 2016-11-23 DIAGNOSIS — Z510 Encounter for antineoplastic radiation therapy: Secondary | ICD-10-CM | POA: Diagnosis not present

## 2016-11-23 DIAGNOSIS — C50111 Malignant neoplasm of central portion of right female breast: Secondary | ICD-10-CM | POA: Diagnosis not present

## 2016-11-23 DIAGNOSIS — Z9221 Personal history of antineoplastic chemotherapy: Secondary | ICD-10-CM | POA: Diagnosis not present

## 2016-11-23 DIAGNOSIS — F419 Anxiety disorder, unspecified: Secondary | ICD-10-CM | POA: Diagnosis not present

## 2016-11-23 DIAGNOSIS — F329 Major depressive disorder, single episode, unspecified: Secondary | ICD-10-CM | POA: Diagnosis not present

## 2016-11-24 ENCOUNTER — Ambulatory Visit
Admission: RE | Admit: 2016-11-24 | Discharge: 2016-11-24 | Disposition: A | Discharge status: Home / Self Care | Payer: Medicare Other | Source: Ambulatory Visit | Attending: Radiation Oncology | Admitting: Radiation Oncology

## 2016-11-24 DIAGNOSIS — Z171 Estrogen receptor negative status [ER-]: Secondary | ICD-10-CM | POA: Diagnosis not present

## 2016-11-24 DIAGNOSIS — F329 Major depressive disorder, single episode, unspecified: Secondary | ICD-10-CM | POA: Diagnosis not present

## 2016-11-24 DIAGNOSIS — Z510 Encounter for antineoplastic radiation therapy: Secondary | ICD-10-CM | POA: Diagnosis not present

## 2016-11-24 DIAGNOSIS — F419 Anxiety disorder, unspecified: Secondary | ICD-10-CM | POA: Diagnosis not present

## 2016-11-24 DIAGNOSIS — Z9221 Personal history of antineoplastic chemotherapy: Secondary | ICD-10-CM | POA: Diagnosis not present

## 2016-11-24 DIAGNOSIS — C50111 Malignant neoplasm of central portion of right female breast: Secondary | ICD-10-CM | POA: Diagnosis not present

## 2016-11-25 ENCOUNTER — Ambulatory Visit
Admission: RE | Admit: 2016-11-25 | Discharge: 2016-11-25 | Disposition: A | Discharge status: Home / Self Care | Payer: Medicare Other | Source: Ambulatory Visit | Attending: Radiation Oncology | Admitting: Radiation Oncology

## 2016-11-25 DIAGNOSIS — Z171 Estrogen receptor negative status [ER-]: Secondary | ICD-10-CM | POA: Diagnosis not present

## 2016-11-25 DIAGNOSIS — Z510 Encounter for antineoplastic radiation therapy: Secondary | ICD-10-CM | POA: Diagnosis not present

## 2016-11-25 DIAGNOSIS — C50111 Malignant neoplasm of central portion of right female breast: Secondary | ICD-10-CM | POA: Diagnosis not present

## 2016-11-25 DIAGNOSIS — Z9221 Personal history of antineoplastic chemotherapy: Secondary | ICD-10-CM | POA: Diagnosis not present

## 2016-11-25 DIAGNOSIS — F419 Anxiety disorder, unspecified: Secondary | ICD-10-CM | POA: Diagnosis not present

## 2016-11-25 DIAGNOSIS — F329 Major depressive disorder, single episode, unspecified: Secondary | ICD-10-CM | POA: Diagnosis not present

## 2016-11-28 ENCOUNTER — Ambulatory Visit
Admission: RE | Admit: 2016-11-28 | Discharge: 2016-11-28 | Disposition: A | Discharge status: Home / Self Care | Payer: Medicare Other | Source: Ambulatory Visit | Attending: Radiation Oncology | Admitting: Radiation Oncology

## 2016-11-28 DIAGNOSIS — F419 Anxiety disorder, unspecified: Secondary | ICD-10-CM | POA: Diagnosis not present

## 2016-11-28 DIAGNOSIS — Z510 Encounter for antineoplastic radiation therapy: Secondary | ICD-10-CM | POA: Diagnosis not present

## 2016-11-28 DIAGNOSIS — F329 Major depressive disorder, single episode, unspecified: Secondary | ICD-10-CM | POA: Diagnosis not present

## 2016-11-28 DIAGNOSIS — C50111 Malignant neoplasm of central portion of right female breast: Secondary | ICD-10-CM | POA: Diagnosis not present

## 2016-11-28 DIAGNOSIS — Z9221 Personal history of antineoplastic chemotherapy: Secondary | ICD-10-CM | POA: Diagnosis not present

## 2016-11-28 DIAGNOSIS — Z171 Estrogen receptor negative status [ER-]: Secondary | ICD-10-CM | POA: Diagnosis not present

## 2016-11-28 NOTE — Progress Notes (Signed)
Patient ambulated to nursing after rad tx c/o very fatigued "Feels like a wet noodle, , I've not slept well last night past 3 days, she has been drinking Pedialyte today , " checked her Vital sstanding and sitting,  Sitting "B/p=146/75,T=98.5, P=55,RR=72m room air sats=99% Standing B/P=123/59, P=60, Room air sats=99% Patient is taking her cymbalta and gabapentin at night, asked to check side effects fo cymbalta= gave her information, ,fatigue, loss appetite, sleeplessness, tired, , she wil start taking cymbalta in am , she stated she would call her Dr to make sure, "they said I could take it at night or daytime," she will drink more fluids and try to eat more protein maybe a steak tonight 3:53 PM

## 2016-11-29 ENCOUNTER — Ambulatory Visit
Admission: RE | Admit: 2016-11-29 | Discharge: 2016-11-29 | Disposition: A | Discharge status: Home / Self Care | Payer: Medicare Other | Source: Ambulatory Visit | Attending: Radiation Oncology | Admitting: Radiation Oncology

## 2016-11-29 DIAGNOSIS — F419 Anxiety disorder, unspecified: Secondary | ICD-10-CM | POA: Diagnosis not present

## 2016-11-29 DIAGNOSIS — Z171 Estrogen receptor negative status [ER-]: Secondary | ICD-10-CM | POA: Diagnosis not present

## 2016-11-29 DIAGNOSIS — Z510 Encounter for antineoplastic radiation therapy: Secondary | ICD-10-CM | POA: Diagnosis not present

## 2016-11-29 DIAGNOSIS — C50111 Malignant neoplasm of central portion of right female breast: Secondary | ICD-10-CM | POA: Diagnosis not present

## 2016-11-29 DIAGNOSIS — Z9221 Personal history of antineoplastic chemotherapy: Secondary | ICD-10-CM | POA: Diagnosis not present

## 2016-11-29 DIAGNOSIS — F329 Major depressive disorder, single episode, unspecified: Secondary | ICD-10-CM | POA: Diagnosis not present

## 2016-11-30 ENCOUNTER — Ambulatory Visit
Admission: RE | Admit: 2016-11-30 | Discharge: 2016-11-30 | Disposition: A | Discharge status: Home / Self Care | Payer: Medicare Other | Source: Ambulatory Visit | Attending: Radiation Oncology | Admitting: Radiation Oncology

## 2016-11-30 DIAGNOSIS — Z510 Encounter for antineoplastic radiation therapy: Secondary | ICD-10-CM | POA: Diagnosis not present

## 2016-11-30 DIAGNOSIS — Z9221 Personal history of antineoplastic chemotherapy: Secondary | ICD-10-CM | POA: Diagnosis not present

## 2016-11-30 DIAGNOSIS — F329 Major depressive disorder, single episode, unspecified: Secondary | ICD-10-CM | POA: Diagnosis not present

## 2016-11-30 DIAGNOSIS — Z171 Estrogen receptor negative status [ER-]: Secondary | ICD-10-CM | POA: Diagnosis not present

## 2016-11-30 DIAGNOSIS — C50111 Malignant neoplasm of central portion of right female breast: Secondary | ICD-10-CM | POA: Diagnosis not present

## 2016-11-30 DIAGNOSIS — F419 Anxiety disorder, unspecified: Secondary | ICD-10-CM | POA: Diagnosis not present

## 2016-12-01 ENCOUNTER — Ambulatory Visit
Admission: RE | Admit: 2016-12-01 | Discharge: 2016-12-01 | Disposition: A | Discharge status: Home / Self Care | Payer: Medicare Other | Source: Ambulatory Visit | Attending: Radiation Oncology | Admitting: Radiation Oncology

## 2016-12-01 DIAGNOSIS — F329 Major depressive disorder, single episode, unspecified: Secondary | ICD-10-CM | POA: Diagnosis not present

## 2016-12-01 DIAGNOSIS — Z9221 Personal history of antineoplastic chemotherapy: Secondary | ICD-10-CM | POA: Diagnosis not present

## 2016-12-01 DIAGNOSIS — Z171 Estrogen receptor negative status [ER-]: Secondary | ICD-10-CM | POA: Diagnosis not present

## 2016-12-01 DIAGNOSIS — F419 Anxiety disorder, unspecified: Secondary | ICD-10-CM | POA: Diagnosis not present

## 2016-12-01 DIAGNOSIS — C50111 Malignant neoplasm of central portion of right female breast: Secondary | ICD-10-CM | POA: Diagnosis not present

## 2016-12-01 DIAGNOSIS — Z510 Encounter for antineoplastic radiation therapy: Secondary | ICD-10-CM | POA: Diagnosis not present

## 2016-12-02 ENCOUNTER — Ambulatory Visit
Admission: RE | Admit: 2016-12-02 | Discharge: 2016-12-02 | Disposition: A | Discharge status: Home / Self Care | Payer: Medicare Other | Source: Ambulatory Visit | Attending: Radiation Oncology | Admitting: Radiation Oncology

## 2016-12-02 DIAGNOSIS — Z9221 Personal history of antineoplastic chemotherapy: Secondary | ICD-10-CM | POA: Diagnosis not present

## 2016-12-02 DIAGNOSIS — C50111 Malignant neoplasm of central portion of right female breast: Secondary | ICD-10-CM | POA: Diagnosis not present

## 2016-12-02 DIAGNOSIS — F329 Major depressive disorder, single episode, unspecified: Secondary | ICD-10-CM | POA: Diagnosis not present

## 2016-12-02 DIAGNOSIS — Z171 Estrogen receptor negative status [ER-]: Secondary | ICD-10-CM | POA: Diagnosis not present

## 2016-12-02 DIAGNOSIS — Z510 Encounter for antineoplastic radiation therapy: Secondary | ICD-10-CM | POA: Diagnosis not present

## 2016-12-02 DIAGNOSIS — F419 Anxiety disorder, unspecified: Secondary | ICD-10-CM | POA: Diagnosis not present

## 2016-12-05 ENCOUNTER — Ambulatory Visit
Admission: RE | Admit: 2016-12-05 | Discharge: 2016-12-05 | Disposition: A | Discharge status: Home / Self Care | Payer: Medicare Other | Source: Ambulatory Visit | Attending: Radiation Oncology | Admitting: Radiation Oncology

## 2016-12-05 DIAGNOSIS — F329 Major depressive disorder, single episode, unspecified: Secondary | ICD-10-CM | POA: Diagnosis not present

## 2016-12-05 DIAGNOSIS — C50111 Malignant neoplasm of central portion of right female breast: Secondary | ICD-10-CM | POA: Diagnosis not present

## 2016-12-05 DIAGNOSIS — F419 Anxiety disorder, unspecified: Secondary | ICD-10-CM | POA: Diagnosis not present

## 2016-12-05 DIAGNOSIS — Z171 Estrogen receptor negative status [ER-]: Secondary | ICD-10-CM | POA: Diagnosis not present

## 2016-12-05 DIAGNOSIS — Z510 Encounter for antineoplastic radiation therapy: Secondary | ICD-10-CM | POA: Diagnosis not present

## 2016-12-05 DIAGNOSIS — Z9221 Personal history of antineoplastic chemotherapy: Secondary | ICD-10-CM | POA: Diagnosis not present

## 2016-12-06 ENCOUNTER — Encounter: Payer: Self-pay | Admitting: Radiation Oncology

## 2016-12-06 ENCOUNTER — Ambulatory Visit
Admission: RE | Admit: 2016-12-06 | Discharge: 2016-12-06 | Disposition: A | Discharge status: Home / Self Care | Payer: Medicare Other | Source: Ambulatory Visit | Attending: Radiation Oncology | Admitting: Radiation Oncology

## 2016-12-06 ENCOUNTER — Telehealth: Payer: Self-pay | Admitting: *Deleted

## 2016-12-06 DIAGNOSIS — F419 Anxiety disorder, unspecified: Secondary | ICD-10-CM | POA: Diagnosis not present

## 2016-12-06 DIAGNOSIS — Z510 Encounter for antineoplastic radiation therapy: Secondary | ICD-10-CM | POA: Diagnosis not present

## 2016-12-06 DIAGNOSIS — Z9221 Personal history of antineoplastic chemotherapy: Secondary | ICD-10-CM | POA: Diagnosis not present

## 2016-12-06 DIAGNOSIS — C50111 Malignant neoplasm of central portion of right female breast: Secondary | ICD-10-CM | POA: Diagnosis not present

## 2016-12-06 DIAGNOSIS — F329 Major depressive disorder, single episode, unspecified: Secondary | ICD-10-CM | POA: Diagnosis not present

## 2016-12-06 DIAGNOSIS — Z171 Estrogen receptor negative status [ER-]: Secondary | ICD-10-CM | POA: Diagnosis not present

## 2016-12-06 NOTE — Telephone Encounter (Signed)
  Oncology Nurse Navigator Documentation  Navigator Location: [facility] (12/06/16 0900)   )Navigator Encounter Type: Telephone (12/06/16 0900) Telephone: Outgoing Call (12/06/16 0900)     Surgery Date: 09/13/16 (12/06/16 0900)             Patient Visit Type: RadOnc (12/06/16 0900) Treatment Phase: Final Radiation Tx (12/06/16 0900)       Referrals: Survivorship (12/06/16 0900)                    Time Spent with Patient: 15 (12/06/16 0900)

## 2016-12-06 NOTE — Progress Notes (Signed)
[facility] Counseling Note  Individual session with pt. Counselor joined patient as she rung the bell in radiation, signaling her last radiation tx. Counselor and pt celebrated this victory. Following this, counselor and pt discussed pt's homework from last session -- journaling about the "successfulness" of her connections with others this week. Pt was unable to find common themes. Counselor and pt then discussed pt's relationship hx. Pt was able to find connections between her relationship with her ex-husband and hurdles to connecting with people now (ability to trust has been affected, fear of being criticized, etc). Counselor and pt concluded session with scheduling; next appointment is scheduled for 7/11 at 3:30p.  Westly Pam, [place] [facility] [place] [phone]

## 2016-12-14 NOTE — Progress Notes (Signed)
  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    ICD-9-CM ICD-10-CM   1. Malignant neoplasm of upper-inner quadrant of right breast in female, estrogen receptor negative ([place]) 174.2 C50.211    V86.1 Z17.1     Indication for treatment:  Curative       Radiation treatment dates:   10/20/2016 to 12/06/2016  Site/dose:    1. The Right breast was treated to 50.4 Gy in 28 fractions at 1.8 Gy per fraction. 2. The Right breast was boosted to 10 Gy in 5 fractions at 2 Gy per fraction.   Beams/energy:    1. 10X, 6X // 3D 2. 12E // electron  Narrative: The patient tolerated radiation treatment relatively well.   She developed moderate radiation change in the treatment area with no moist desquamation. Patient reports using biafine 2-3 times daily. Reports good appetite, less fatigue, and sleeping better since stopping cymbalta.  Plan: The patient has completed radiation treatment. The patient will return to radiation oncology clinic for routine followup in one month. I advised them to call or return sooner if they have any questions or concerns related to their recovery or treatment.  ------------------------------------------------  Jodelle Gross, MD, PhD  This document serves as a record of services personally performed by Kyung Rudd, MD. It was created on his behalf by Arlyce Harman, a trained medical scribe. The creation of this record is based on the scribe's personal observations and the provider's statements to them. This document has been checked and approved by the attending provider.

## 2016-12-15 ENCOUNTER — Ambulatory Visit: Payer: Medicare Other | Admitting: Family Medicine

## 2016-12-19 ENCOUNTER — Encounter: Payer: Self-pay | Admitting: Hematology and Oncology

## 2016-12-19 ENCOUNTER — Ambulatory Visit: Payer: Medicare Other

## 2016-12-19 ENCOUNTER — Ambulatory Visit (HOSPITAL_BASED_OUTPATIENT_CLINIC_OR_DEPARTMENT_OTHER): Payer: Medicare Other | Admitting: Hematology and Oncology

## 2016-12-19 DIAGNOSIS — Z95828 Presence of other vascular implants and grafts: Secondary | ICD-10-CM

## 2016-12-19 DIAGNOSIS — Z923 Personal history of irradiation: Secondary | ICD-10-CM

## 2016-12-19 DIAGNOSIS — Z9221 Personal history of antineoplastic chemotherapy: Secondary | ICD-10-CM | POA: Diagnosis not present

## 2016-12-19 DIAGNOSIS — Z171 Estrogen receptor negative status [ER-]: Secondary | ICD-10-CM | POA: Diagnosis not present

## 2016-12-19 DIAGNOSIS — C50211 Malignant neoplasm of upper-inner quadrant of right female breast: Secondary | ICD-10-CM | POA: Diagnosis not present

## 2016-12-19 MED ORDER — CAPECITABINE 500 MG PO TABS: 625.0000 mg/m2 | ORAL_TABLET | Freq: Two times a day (BID) | ORAL | 4 refills | Status: DC

## 2016-12-19 MED ORDER — HEPARIN SOD (PORK) LOCK FLUSH 100 UNIT/ML IV SOLN
500.0000 [IU] | Freq: Once | INTRAVENOUS | Status: AC | PRN
  Administered 2016-12-19: 500 [IU] via INTRAVENOUS
  Filled 2016-12-19: qty 5

## 2016-12-19 MED ORDER — SODIUM CHLORIDE 0.9% FLUSH
10.0000 mL | INTRAVENOUS | Status: DC | PRN
  Administered 2016-12-19: 10 mL via INTRAVENOUS
  Filled 2016-12-19: qty 10

## 2016-12-19 NOTE — Assessment & Plan Note (Signed)
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  T2 N0 stage 2A clinical stage  Treatment summary: 1. Neoadjuvant chemotherapy with dose dense Adriamycin and Cytoxan 4 followed by Abraxane weekly 5 (stopped early due to neuropathy) 2. 09/13/2016: Right lumpectomy: IDC grade 3, 2 foci, 2 cm and 1.1 cm, 0/3 lymph nodes negative margins negative, ER 0%, PR 0%, HER-2 negative ratio 1.02, Ki-67 60%, RCB-II; ypT2ypN0 Stage 2A (with plastic surgery removing the ruptured implant) 3. Followed by radiation therapy completed 12/06/2016 ----------------------------------------------------------------------------------------------------------------------------------------- Recommendation: SWOG 1418 clinical trial Pembrolizumab versus observation  

## 2016-12-19 NOTE — Progress Notes (Signed)
Patient Care Team: Tammi Sou, MD as PCP - General (Family Medicine) Alphonsa Overall, MD as Consulting Physician (General Surgery) Nicholas Lose, MD as Consulting Physician (Hematology and Oncology) Kyung Rudd, MD as Consulting Physician (Radiation Oncology) Irene Limbo, MD as Consulting Physician (Plastic Surgery) Gerome Apley, MD as Consulting Physician (Endocrinology)  DIAGNOSIS:  Encounter Diagnosis  Name Primary?  . Malignant neoplasm of upper-inner quadrant of right breast in female, estrogen receptor negative ([place])     SUMMARY OF ONCOLOGIC HISTORY:   Breast cancer of upper-inner quadrant of right female breast ([place])   02/29/2016 Initial Diagnosis    Right breast palpable mass (with silicone implants 7001), 3.5 cm on MRI, additional 3 cm anterior linear enhancement? 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 Ki-67 60%, T2 N0 stage 2A clinical stage      03/14/2016 Procedure    Right breast biopsy upper inner quadrant: IDC grade 3      03/28/2016 -  Neo-Adjuvant Chemotherapy    Neoadjuvant chemotherapy with dose dense Adriamycin and Cytoxan followed by Abraxane weekly 5 ( patient is diabetic and cannot take steroids)      07/15/2016 Breast MRI    Right breast: Spiculated mass 1.5 cm significantly smaller compared to prior,NME previously seen is not noted, no abnormal lymph nodes      09/13/2016 Surgery    Removal of the silicone implant due to intracapsular rupture and capsulectomy (Dr.Thimappa)      09/13/2016 Surgery    Right lumpectomy: IDC grade 3, 2 foci, 2 cm and 1.1 cm, 0/3 lymph nodes negative margins negative, ER 0%, PR 0%, HER-2 negative ratio 1.02, Ki-67 60%, RCB-II; ypT2ypN0 Stage 2A       10/20/2016 - 12/06/2016 Radiation Therapy    Adjuvant radiation therapy       CHIEF COMPLIANT: Follow-up after adjuvant radiation therapy  INTERVAL HISTORY: Marissa Hall is a 76 year old with above-mentioned is right breast cancer treated with  neoadjuvant chemotherapy followed by lumpectomy and radiation and is here today to discuss further adjuvant treatment plan. Patient has recovered from the effects of radiation. She is here to talk about adjuvant Xeloda versus immunotherapy clinical trial.   REVIEW OF SYSTEMS:   Constitutional: Denies fevers, chills or abnormal weight loss Eyes: Denies blurriness of vision Ears, nose, mouth, throat, and face: Denies mucositis or sore throat Respiratory: Denies cough, dyspnea or wheezes Cardiovascular: Denies palpitation, chest discomfort Gastrointestinal:  Denies nausea, heartburn or change in bowel habits Skin: Denies abnormal skin rashes Lymphatics: Denies new lymphadenopathy or easy bruising Neurological:Denies numbness, tingling or new weaknesses Behavioral/Psych: Mood is stable, no new changes  Extremities: No lower extremity edema Breast:  Radiation dermatitis All other systems were reviewed with the patient and are negative.  I have reviewed the past medical history, past surgical history, social history and family history with the patient and they are unchanged from previous note.  ALLERGIES:  is allergic to other and penicillins.  MEDICATIONS:  Current Outpatient Prescriptions  Medication Sig Dispense Refill  . atorvastatin (LIPITOR) 80 MG tablet Take 80 mg by mouth every evening.     [name] Kitchen azelastine (ASTELIN) 0.1 % nasal spray Place 2 sprays into both nostrils daily as needed for rhinitis or allergies. Use in each nostril as directed    . capecitabine (XELODA) 500 MG tablet Take 2 tablets (1,000 mg total) by mouth 2 (two) times daily after a meal. 56 tablet 4  . DULoxetine (CYMBALTA) 30 MG capsule Take 1 capsule (30 mg  total) by mouth daily. 30 capsule 1  . gabapentin (NEURONTIN) 100 MG capsule Take 300 mg by mouth at bedtime.     . hyaluronate sodium (RADIAPLEXRX) GEL Apply 1 application topically 2 (two) times daily.    [name] Kitchen levothyroxine (SYNTHROID, LEVOTHROID) 100 MCG tablet Take  100 mcg by mouth daily before breakfast.     . lisinopril (PRINIVIL,ZESTRIL) 20 MG tablet Take 20 mg by mouth every evening.     . magnesium oxide (MAG-OX) 400 (241.3 Mg) MG tablet Take 1.5 tablets (600 mg total) by mouth daily. (Patient not taking: Reported on 10/28/2016) 30 tablet 3  . metFORMIN (GLUCOPHAGE-XR) 500 MG 24 hr tablet Take 1,000 mg by mouth daily with supper.     . non-metallic deodorant Jethro Poling) MISC Apply 1 application topically.    [name] Kitchen omeprazole (PRILOSEC) 40 MG capsule Take 1 capsule (40 mg total) by mouth daily. (Patient not taking: Reported on 09/28/2016) 30 capsule 2  . pyridoxine (B-6) 100 MG tablet Take 100 mg by mouth.    . Vitamin D, Ergocalciferol, (DRISDOL) 50000 units CAPS capsule Take 50,000 Units by mouth every 7 (seven) days. Saturdays     No current facility-administered medications for this visit.     PHYSICAL EXAMINATION: ECOG PERFORMANCE STATUS: 0 - Asymptomatic  Vitals:   12/19/16 1421  BP: 125/61  Pulse: 71  Resp: 18  Temp: 98.4 F (36.9 C)   Filed Weights   12/19/16 1421  Weight: 174 lb 3.2 oz (79 kg)    GENERAL:alert, no distress and comfortable SKIN: skin color, texture, turgor are normal, no rashes or significant lesions EYES: normal, Conjunctiva are pink and non-injected, sclera clear OROPHARYNX:no exudate, no erythema and lips, buccal mucosa, and tongue normal  NECK: supple, thyroid normal size, non-tender, without nodularity LYMPH:  no palpable lymphadenopathy in the cervical, axillary or inguinal LUNGS: clear to auscultation and percussion with normal breathing effort HEART: regular rate & rhythm and no murmurs and no lower extremity edema ABDOMEN:abdomen soft, non-tender and normal bowel sounds MUSCULOSKELETAL:no cyanosis of digits and no clubbing  NEURO: alert & oriented x 3 with fluent speech, no focal motor/sensory deficits EXTREMITIES: No lower extremity edema  LABORATORY DATA:  I have reviewed the data as listed   Chemistry       Component Value Date/Time   NA 139 09/07/2016 1320   NA 140 07/11/2016 1100   K 4.4 09/07/2016 1320   K 4.1 07/11/2016 1100   CL 102 09/07/2016 1320   CO2 29 09/07/2016 1320   CO2 25 07/11/2016 1100   BUN 12 09/07/2016 1320   BUN 14.9 07/11/2016 1100   CREATININE 0.81 09/07/2016 1320   CREATININE 0.8 07/11/2016 1100      Component Value Date/Time   CALCIUM 9.6 09/07/2016 1320   CALCIUM 9.4 07/11/2016 1100   ALKPHOS 88 07/11/2016 1100   AST 28 07/11/2016 1100   ALT 31 07/11/2016 1100   BILITOT 0.86 07/11/2016 1100       Lab Results  Component Value Date   WBC 4.9 10/03/2016   HGB 12.5 10/03/2016   HCT 37.6 10/03/2016   MCV 95.9 10/03/2016   PLT 161 10/03/2016   NEUTROABS 2.8 10/03/2016    ASSESSMENT & PLAN:  Breast cancer of upper-inner quadrant of right female breast ([place]) 02/29/2016: Right breast palpable mass (with silicone implants 8144), 3.5 cm on MRI, additional 3 cm anterior linear enhancement (biopsy 03/14/2016 IDC grade 3); 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  T2 N0 stage 2A  clinical stage  Treatment summary: 1. Neoadjuvant chemotherapy with dose dense Adriamycin and Cytoxan 4 followed by Abraxane weekly 5 (stopped early due to neuropathy) 2. 09/13/2016: Right lumpectomy: IDC grade 3, 2 foci, 2 cm and 1.1 cm, 0/3 lymph nodes negative margins negative, ER 0%, PR 0%, HER-2 negative ratio 1.02, Ki-67 60%, RCB-II; ypT2ypN0 Stage 2A (with plastic surgery removing the ruptured implant) 3. Followed by radiation therapy completed 12/06/2016 ----------------------------------------------------------------------------------------------------------------------------------------- Recommendation:  1. Adjuvant Xeloda 1000 mg by mouth twice a day 2 weeks on one week off Return to clinic in 3 weeks for toxicity check and follow-up CREATE-X study enrolled 910 patient's with triple negative breast cancer and residual disease after neo-adjuvant therapy. 455 patients  assigned to 1250 mg/m times daily 2 weeks on 1 week off  up to 8 cycles, at 5 years PFS 74.1% with Xeloda compared to 67.7% in control arm, 30% reduction in risk, overall survival 89.2% versus 83.9%, hand-foot syndrome was seen in 72.3% with Xeloda grade 3 in 10.9%   2. after 3-4 months of Xeloda, we will plan to randomize the patient on SWOG 1418 clinical trial Pembrolizumab versus observation  I spent 25 minutes talking to the patient of which more than half was spent in counseling and coordination of care.  Orders Placed This Encounter  Procedures  . CBC with Differential    Standing Status:   Future    Standing Expiration Date:   12/19/2017  . Comprehensive metabolic panel    Standing Status:   Future    Standing Expiration Date:   12/19/2017   The patient has a good understanding of the overall plan. she agrees with it. she will call with any problems that may develop before the next visit here.   Rulon Eisenmenger, MD 12/19/16

## 2016-12-20 ENCOUNTER — Telehealth: Payer: Self-pay | Admitting: Pharmacist

## 2016-12-20 DIAGNOSIS — Z171 Estrogen receptor negative status [ER-]: Principal | ICD-10-CM

## 2016-12-20 DIAGNOSIS — C50211 Malignant neoplasm of upper-inner quadrant of right female breast: Secondary | ICD-10-CM

## 2016-12-20 MED ORDER — CAPECITABINE 500 MG PO TABS: 625.0000 mg/m2 | ORAL_TABLET | Freq: Two times a day (BID) | ORAL | 0 refills | Status: DC

## 2016-12-20 NOTE — Progress Notes (Signed)
[facility] Counseling Note  Counselor called pt to reschedule missed appointment on 7/11. Pt stated that she is without transportation at the moment and asked counselor to call her back on 7/19 when she has more information on her car repair needs. Counselor agreed.  Westly Pam, [place] [facility] [place]

## 2016-12-20 NOTE — Telephone Encounter (Signed)
Oral Chemotherapy Pharmacist Encounter  Received new prescription for Xeloda for the adjuvant treatment of triple negative breast cancer. Labs reviewed, OK for treatment Current medication list in Epic assessed, no significant DDIs with Xeloda identified.  The prescription has been -e-scribed to the [facility] outpatient pharmacy for benefit analysis and approval.   Oral Oncology Clinic will continue to follow for insurance authorization, copayment issues, initial counseling, and start date.  Thank you,  Marissa Hall, PharmD, BCPS, BCOP 12/20/2016  9:13 AM Oral Oncology Clinic [phone]

## 2016-12-20 NOTE — Telephone Encounter (Signed)
Oral Chemotherapy Pharmacist Encounter   I spoke with patient for overview of new oral chemotherapy medication: Xeloda. Pt is doing well. The prescription is being filled at the [facility]. Copayment for 1st fill $64.83. Discussed copayment grant assistance with patient, she currently is over income for available grant money. Patient informed that there is no copayment assistance available to her at the moment. Oral Oncology Clinic will continue to surveil for open grant funds daily.   Counseled patient on administration, dosing, side effects, safe handling, and monitoring. Patient will take Xeloda 500mg  tablets, 2 tablets (1000mg ) by mouth 2 times daily within 30 minutes of finishing AM & PM meals, for 14 days on, 7 days off. Patient understands her dose may increase after this 1st cycle depending on toleration.  Side effects include but not limited to: fatigue, GI upset, diarrhea, mouth sores, and hand-foot syndrome.  Ms. Agro voiced understanding and appreciation.   All questions answered.  Patient requested her Xeloda be filled at the pharmacy this coming Monday 7/23 and she will pick it up sometime the 1st part of next week. We will arrange this with the pharmacy for patient.   Patient knows to call the office with any questions or concerns.  Thank you,  Johny Drilling, PharmD, BCPS, BCOP 12/20/2016  9:54 AM Oral Oncology Clinic [phone]

## 2016-12-21 ENCOUNTER — Ambulatory Visit: Payer: Medicare Other | Admitting: Family Medicine

## 2016-12-23 NOTE — Progress Notes (Signed)
[facility] Counseling Note  Called pt per her request earlier this week to f/u on her interest to schedule last individual session. Pt was unable to answer the phone, so counselor LVM on cell phone.   Westly Pam, [place] [facility] [place]

## 2016-12-26 MED FILL — XELODA 500 MG TABLET: 500 | 21 days supply | Qty: 56 | Fill #0

## 2016-12-27 NOTE — Progress Notes (Signed)
[facility] Counseling Note  Counselor called pt to f/u on pt's interest in scheduling final individual counseling appointment. Pt confirmed interest; last appointment scheduled for 4:30p on 7/25.  Westly Pam, [place] [facility] [place]

## 2016-12-28 NOTE — Progress Notes (Signed)
[facility] Counseling Note  Final individual counseling session with patient, due to counselor's internship ending. Pt has requested to continue with Master's level intern in August.  Counselor opened session with inquiry into patient's week. Following this, counselor engaged patient in termination summary, including intervention centered on patient reflecting on her accomplishments in therapy, the goals she still wishes to reach, what she needs from her next counselor, and an introduction to her new counselor. Counselor and patient celebrated patient's achievements and brainstormed a self-care plan for patient as she waits for new counselor to begin.   Westly Pam, [place] [facility] [place]

## 2016-12-29 ENCOUNTER — Telehealth: Payer: Self-pay

## 2016-12-29 ENCOUNTER — Encounter: Payer: Self-pay | Admitting: Family Medicine

## 2016-12-29 NOTE — Telephone Encounter (Signed)
Will call to follow up with pt.

## 2016-12-29 NOTE — Telephone Encounter (Signed)
Pt requesting to talk with Dr Lindi Adie about her xeloda in relation to the fatigue she is having.  She is having extreme fatigue. She was not sure if it is delayed fatigue from prior treatments. She has not started her xeloda yet. But she does have the xeloda at her house.  She has been tired all along but noticed the fatigue especially in the last few days Also gabapentin was giving her a strange feeling in head and was interfering with her gait and she stopped taking it about 4 days ago. She is feeling more steady in her walk, not feeling like she will trip and fall.

## 2016-12-29 NOTE — Telephone Encounter (Signed)
Called pt to return her call regarding startin xeloda. Pt states that she is very hesitant to start Xeloda medication due to extreme fatigue x 1 week. Pt does not recall doing any strenuous activity the week before. Pt denies fevers, chills or current infection symptoms. Pt finished radiation beginning of July. Pt was feeling fine until a week ago. Told pt that it could be related to post radiation treatment. Pt hydrating and eating well. Pt was also on gabapentin for neuropathy and had stopped due to head feeling "fuzzy and full". Since she stopped taking Gabapentin, pt gait is more steady and is able to walk better. Pt is concern that the severity of her fatigue level may interfere with starting xeloda and does not feel comfortable moving forward with this treatment until she sees Dr. Lindi Adie. Suggested that pt come in tomorrow for lab work and to see Dr.Gudena to discuss plan. Pt verbalized understanding and confirmed appt. No further concerns at this time and pt very appreciative of call.

## 2016-12-30 ENCOUNTER — Other Ambulatory Visit (HOSPITAL_BASED_OUTPATIENT_CLINIC_OR_DEPARTMENT_OTHER): Payer: Medicare Other

## 2016-12-30 ENCOUNTER — Ambulatory Visit (HOSPITAL_BASED_OUTPATIENT_CLINIC_OR_DEPARTMENT_OTHER): Payer: Medicare Other | Admitting: Hematology and Oncology

## 2016-12-30 ENCOUNTER — Encounter: Payer: Self-pay | Admitting: Family Medicine

## 2016-12-30 ENCOUNTER — Ambulatory Visit (INDEPENDENT_AMBULATORY_CARE_PROVIDER_SITE_OTHER): Payer: Medicare Other | Admitting: Family Medicine

## 2016-12-30 ENCOUNTER — Ambulatory Visit (HOSPITAL_BASED_OUTPATIENT_CLINIC_OR_DEPARTMENT_OTHER): Payer: Medicare Other

## 2016-12-30 VITALS — BP 106/64 | HR 67 | Temp 98.4°F | Resp 16 | Ht 68.5 in | Wt 175.2 lb

## 2016-12-30 DIAGNOSIS — F411 Generalized anxiety disorder: Secondary | ICD-10-CM

## 2016-12-30 DIAGNOSIS — C50211 Malignant neoplasm of upper-inner quadrant of right female breast: Secondary | ICD-10-CM

## 2016-12-30 DIAGNOSIS — F330 Major depressive disorder, recurrent, mild: Secondary | ICD-10-CM

## 2016-12-30 DIAGNOSIS — Z171 Estrogen receptor negative status [ER-]: Secondary | ICD-10-CM | POA: Diagnosis not present

## 2016-12-30 DIAGNOSIS — Z95828 Presence of other vascular implants and grafts: Secondary | ICD-10-CM

## 2016-12-30 LAB — COMPREHENSIVE METABOLIC PANEL
ALBUMIN: 4 g/dL (ref 3.5–5.0)
ALK PHOS: 77 U/L (ref 40–150)
ALT: 45 U/L (ref 0–55)
AST: 53 U/L — ABNORMAL HIGH (ref 5–34)
Anion Gap: 10 mEq/L (ref 3–11)
BUN: 18.2 mg/dL (ref 7.0–26.0)
CO2: 26 meq/L (ref 22–29)
Calcium: 9.8 mg/dL (ref 8.4–10.4)
Chloride: 102 mEq/L (ref 98–109)
Creatinine: 0.9 mg/dL (ref 0.6–1.1)
EGFR: 63 mL/min/{1.73_m2} — AB (ref >90)
GLUCOSE: 173 mg/dL — AB (ref 70–140)
POTASSIUM: 4.2 meq/L (ref 3.5–5.1)
SODIUM: 138 meq/L (ref 136–145)
Total Bilirubin: 0.86 mg/dL (ref 0.20–1.20)
Total Protein: 6.9 g/dL (ref 6.4–8.3)

## 2016-12-30 LAB — CBC WITH DIFFERENTIAL/PLATELET
BASO%: 1.1 % (ref 0.0–2.0)
BASOS ABS: 0 10*3/uL (ref 0.0–0.1)
EOS ABS: 0.1 10*3/uL (ref 0.0–0.5)
EOS%: 2.4 % (ref 0.0–7.0)
HCT: 37.4 % (ref 34.8–46.6)
HGB: 12.8 g/dL (ref 11.6–15.9)
LYMPH%: 21.6 % (ref 14.0–49.7)
MCH: 33.6 pg (ref 25.1–34.0)
MCHC: 34.2 g/dL (ref 31.5–36.0)
MCV: 98.4 fL (ref 79.5–101.0)
MONO#: 0.4 10*3/uL (ref 0.1–0.9)
MONO%: 10.2 % (ref 0.0–14.0)
NEUT#: 2.3 10*3/uL (ref 1.5–6.5)
NEUT%: 64.7 % (ref 38.4–76.8)
Platelets: 138 10*3/uL — ABNORMAL LOW (ref 145–400)
RBC: 3.8 10*6/uL (ref 3.70–5.45)
RDW: 14 % (ref 11.2–14.5)
WBC: 3.6 10*3/uL — ABNORMAL LOW (ref 3.9–10.3)
lymph#: 0.8 10*3/uL — ABNORMAL LOW (ref 0.9–3.3)

## 2016-12-30 MED ORDER — HEPARIN SOD (PORK) LOCK FLUSH 100 UNIT/ML IV SOLN
500.0000 [IU] | Freq: Once | INTRAVENOUS | Status: AC | PRN
  Administered 2016-12-30: 500 [IU] via INTRAVENOUS
  Filled 2016-12-30: qty 5

## 2016-12-30 MED ORDER — DULOXETINE HCL 30 MG PO CPEP: 30.0000 mg | ORAL_CAPSULE | Freq: Every day | ORAL | 0 refills | Status: DC

## 2016-12-30 MED ORDER — SODIUM CHLORIDE 0.9% FLUSH
10.0000 mL | INTRAVENOUS | Status: DC | PRN
  Administered 2016-12-30: 10 mL via INTRAVENOUS
  Filled 2016-12-30: qty 10

## 2016-12-30 MED ORDER — TRAZODONE HCL 50 MG PO TABS: ORAL_TABLET | ORAL | 1 refills | Status: DC

## 2016-12-30 NOTE — Assessment & Plan Note (Signed)
02/29/2016: Right breast palpable mass (with silicone implants 2023), 3.5 cm on MRI, additional 3 cm anterior linear enhancement (biopsy 03/14/2016 IDC grade 3); 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  T2 N0 stage 2A clinical stage  Treatment summary: 1. Neoadjuvant chemotherapy with dose dense Adriamycin and Cytoxan 4 followed by Abraxane weekly 5(stopped early due to neuropathy) 2. 09/13/2016: Right lumpectomy: IDC grade 3, 2 foci, 2 cm and 1.1 cm, 0/3 lymph nodes negative margins negative, ER 0%, PR 0%, HER-2 negative ratio 1.02, Ki-67 60%, RCB-II; ypT2ypN0 Stage 2A (with plastic surgery removing the ruptured implant) 3. Followed by radiation therapy completed 12/06/2016 ----------------------------------------------------------------------------------------------------------------------------------------- Recommendation:  1. Adjuvant Xeloda 1000 mg by mouth twice a day 2 weeks on one week off 2. After 3-4 months of Xeloda, we will plan to randomize the patient on SWOG 1418 clinical trial Pembrolizumab versus observation  Xeloda toxicities:  Return to clinic in 4 weeks with labs and follow-up

## 2016-12-30 NOTE — Progress Notes (Signed)
OFFICE VISIT  12/30/2016   CC: F/u anx/dep  HPI:    Patient is a 76 y.o. Caucasian female who presents for 1 mo f/u anxiety and depression. Started duloxetiine 30 mg qd and gave pt info to contact a counselor last visit. She had poor sleep while taking the duloxetine so she stopped it after 3 weeks.   However, she felt like her neuopathy was better on this med.  She did feel a little better regarding anx/dep on this med.  She also stopped her gabapentin due to a feeling of disequilibrium she associated with this med.  This has been better since d/cing this med.  Has chronic fatigue, says her back feels weak like she has muscle fatigue (back and shoulders as well).  She says oncology is considering getting her into PT.  She was on trazodone and found it helpful at 50mg  qhs dosing for 2 yrs then it stopped helping but no increased dose was tried.   Past Medical History:  Diagnosis Date  . Anxiety and depression   . Breast cancer ([place])    Clinical stage 2A: (triple neg): Right breast, upper inner quadrant, 03/2016.  Neoadjuvant chemo x 5 cycles, then lumpectomy 4 mo later, then radiation started 10/2016.  Adjuvant Xeloda starting 01/2017.  . Depression   . Diabetes ([place])    with painful PN-managed by endo.  [name] Kitchen GERD (gastroesophageal reflux disease)   . Hypertension   . Hypothyroidism    managed by endo.  [name] Kitchen Neuropathy    feet  . TIA (transient ischemic attack)     Past Surgical History:  Procedure Laterality Date  . ABDOMINAL HYSTERECTOMY  1972  . APPENDECTOMY  1972  . BREAST ENHANCEMENT SURGERY  1982  . BREAST IMPLANT REMOVAL Right 09/13/2016   Procedure: REMOVAL RIGHT BREAST IMPLANT;  Surgeon: Irene Limbo, MD;  Location: [facility];  Service: Plastics;  Laterality: Right;  . BREAST LUMPECTOMY WITH RADIOACTIVE SEED AND SENTINEL LYMPH NODE BIOPSY Right 09/13/2016   Procedure: RIGHT BREAST LUMPECTOMY WITH RADIOACTIVE SEED X 2 AND SENTINEL LYMPH NODE BIOPSY;   Surgeon: Alphonsa Overall, MD;  Location: [facility];  Service: General;  Laterality: Right;  . BREAST SURGERY     breast biopsy  . CAPSULECTOMY Right 09/13/2016   Procedure: RIGHT CAPSULECTOMY;  Surgeon: Irene Limbo, MD;  Location: [facility];  Service: Plastics;  Laterality: Right;  . PORTACATH PLACEMENT N/A 03/15/2016   Procedure: INSERTION PORT-A-CATH WITH [place];  Surgeon: Alphonsa Overall, MD;  Location: [facility];  Service: General;  Laterality: N/A;  . surgical repair left ankle  2009  . TONSILLECTOMY AND ADENOIDECTOMY     REMOTE past    Outpatient Medications Prior to Visit  Medication Sig Dispense Refill  . atorvastatin (LIPITOR) 80 MG tablet Take 80 mg by mouth every evening.     [name] Kitchen azelastine (ASTELIN) 0.1 % nasal spray Place 2 sprays into both nostrils daily as needed for rhinitis or allergies. Use in each nostril as directed    . capecitabine (XELODA) 500 MG tablet Take 2 tablets (1,000 mg total) by mouth 2 (two) times daily after a meal. Take for 14 days on, 7 days off 56 tablet 0  . hyaluronate sodium (RADIAPLEXRX) GEL Apply 1 application topically 2 (two) times daily.    [name] Kitchen lisinopril (PRINIVIL,ZESTRIL) 20 MG tablet Take 20 mg by mouth every evening.     . magnesium oxide (MAG-OX) 400 (241.3 Mg) MG tablet Take 1.5 tablets (600 mg  total) by mouth daily. 30 tablet 3  . metFORMIN (GLUCOPHAGE-XR) 500 MG 24 hr tablet Take 1,000 mg by mouth daily with supper.     [name] Kitchen omeprazole (PRILOSEC) 40 MG capsule Take 1 capsule (40 mg total) by mouth daily. 30 capsule 2  . pyridoxine (B-6) 100 MG tablet Take 100 mg by mouth.    . Vitamin D, Ergocalciferol, (DRISDOL) 50000 units CAPS capsule Take 50,000 Units by mouth every 7 (seven) days. Saturdays    . levothyroxine (SYNTHROID, LEVOTHROID) 100 MCG tablet Take 100 mcg by mouth daily before breakfast.     . DULoxetine (CYMBALTA) 30 MG capsule Take 1 capsule (30 mg total) by mouth daily. (Patient not taking: Reported on  12/30/2016) 30 capsule 1  . gabapentin (NEURONTIN) 100 MG capsule Take 300 mg by mouth at bedtime.     . non-metallic deodorant Jethro Poling) MISC Apply 1 application topically.     No facility-administered medications prior to visit.     Allergies  Allergen Reactions  . Other Other (See Comments)    STEROIDS- emotional  . Penicillins Other (See Comments)    Unsure of reaction, was 76 years old    ROS As per HPI  PE: Blood pressure 106/64, pulse 67, temperature 98.4 F (36.9 C), temperature source Oral, resp. rate 16, height 5' 8.5" (1.74 m), weight 175 lb 4 oz (79.5 kg), SpO2 97 %. Gen: Alert, well appearing.  Patient is oriented to person, place, time, and situation. AFFECT: pleasant, lucid thought and speech. No further exam today.  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 I feel like our best option at this time is to restart the duloxetine at 30mg  qd since she was seeing positive mood/anxiety effects as well as positive effects on her PN. To help with the worsening of her insomnia that this med seems to cause, will restart trazodone that pt has had success with in the past.  Will do 50mg  tabs, and she can start with 1 qhs and gradually titrate to 3 qhs if needed. Therapeutic expectations and side effect profile of medication discussed today.  Patient's questions answered. She will  continue seeing a counselor at the Cancer center, will perhaps need to switch to private counselor in the future when/if cancer treatment stops.  An After Visit Summary was printed and given to the patient.  FOLLOW UP: Return in about 4 weeks (around 01/27/2017) for f/u dep/anx.  Signed:  Crissie Sickles, MD           12/30/2016

## 2016-12-30 NOTE — Progress Notes (Signed)
Patient Care Team: Tammi Sou, MD as PCP - General (Family Medicine) Alphonsa Overall, MD as Consulting Physician (General Surgery) Nicholas Lose, MD as Consulting Physician (Hematology and Oncology) Kyung Rudd, MD as Consulting Physician (Radiation Oncology) Irene Limbo, MD as Consulting Physician (Plastic Surgery) Gerome Apley, MD as Consulting Physician (Endocrinology)  DIAGNOSIS:  Encounter Diagnosis  Name Primary?  . Malignant neoplasm of upper-inner quadrant of right breast in female, estrogen receptor negative ([place])     SUMMARY OF ONCOLOGIC HISTORY:   Breast cancer of upper-inner quadrant of right female breast ([place])   02/29/2016 Initial Diagnosis    Right breast palpable mass (with silicone implants 2505), 3.5 cm on MRI, additional 3 cm anterior linear enhancement? 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 Ki-67 60%, T2 N0 stage 2A clinical stage      03/14/2016 Procedure    Right breast biopsy upper inner quadrant: IDC grade 3      03/28/2016 -  Neo-Adjuvant Chemotherapy    Neoadjuvant chemotherapy with dose dense Adriamycin and Cytoxan followed by Abraxane weekly 5 ( patient is diabetic and cannot take steroids)      07/15/2016 Breast MRI    Right breast: Spiculated mass 1.5 cm significantly smaller compared to prior,NME previously seen is not noted, no abnormal lymph nodes      09/13/2016 Surgery    Removal of the silicone implant due to intracapsular rupture and capsulectomy (Dr.Thimappa)      09/13/2016 Surgery    Right lumpectomy: IDC grade 3, 2 foci, 2 cm and 1.1 cm, 0/3 lymph nodes negative margins negative, ER 0%, PR 0%, HER-2 negative ratio 1.02, Ki-67 60%, RCB-II; ypT2ypN0 Stage 2A       10/20/2016 - 12/06/2016 Radiation Therapy    Adjuvant radiation therapy       CHIEF COMPLIANT: Follow-up to discuss a treatment plan of Xeloda  INTERVAL HISTORY: Marissa Hall is a 19 with above-mentioned history of triple negative right breast  cancer receiving neoadjuvant chemotherapy followed by lumpectomy and radiation. She is here to evaluate side effects of Xeloda however she has not yet started. She is very anxious about starting Xeloda. She complains of fatigue and difficulty with sleeping. She time off on Neurontin because it was making her slightly dizzy. Since she came off Neurontin she is feeling much better.  REVIEW OF SYSTEMS:   Constitutional: Denies fevers, chills or abnormal weight loss, insomnia, fatigue Eyes: Denies blurriness of vision Ears, nose, mouth, throat, and face: Denies mucositis or sore throat Respiratory: Denies cough, dyspnea or wheezes Cardiovascular: Denies palpitation, chest discomfort Gastrointestinal:  Denies nausea, heartburn or change in bowel habits Skin: Denies abnormal skin rashes Lymphatics: Denies new lymphadenopathy or easy bruising Neurological:Denies numbness, tingling or new weaknesses Behavioral/Psych: Mood is stable, no new changes  Extremities: No lower extremity edema  All other systems were reviewed with the patient and are negative.  I have reviewed the past medical history, past surgical history, social history and family history with the patient and they are unchanged from previous note.  ALLERGIES:  is allergic to other and penicillins.  MEDICATIONS:  Current Outpatient Prescriptions  Medication Sig Dispense Refill  . atorvastatin (LIPITOR) 80 MG tablet Take 80 mg by mouth every evening.     [name] Kitchen azelastine (ASTELIN) 0.1 % nasal spray Place 2 sprays into both nostrils daily as needed for rhinitis or allergies. Use in each nostril as directed    . capecitabine (XELODA) 500 MG tablet Take 2 tablets (1,000 mg  total) by mouth 2 (two) times daily after a meal. Take for 14 days on, 7 days off 56 tablet 0  . DULoxetine (CYMBALTA) 30 MG capsule Take 1 capsule (30 mg total) by mouth daily. 30 capsule 1  . gabapentin (NEURONTIN) 100 MG capsule Take 300 mg by mouth at bedtime.     .  hyaluronate sodium (RADIAPLEXRX) GEL Apply 1 application topically 2 (two) times daily.    [name] Kitchen levothyroxine (SYNTHROID, LEVOTHROID) 100 MCG tablet Take 100 mcg by mouth daily before breakfast.     . lisinopril (PRINIVIL,ZESTRIL) 20 MG tablet Take 20 mg by mouth every evening.     . magnesium oxide (MAG-OX) 400 (241.3 Mg) MG tablet Take 1.5 tablets (600 mg total) by mouth daily. (Patient not taking: Reported on 10/28/2016) 30 tablet 3  . metFORMIN (GLUCOPHAGE-XR) 500 MG 24 hr tablet Take 1,000 mg by mouth daily with supper.     . non-metallic deodorant Jethro Poling) MISC Apply 1 application topically.    [name] Kitchen omeprazole (PRILOSEC) 40 MG capsule Take 1 capsule (40 mg total) by mouth daily. (Patient not taking: Reported on 09/28/2016) 30 capsule 2  . pyridoxine (B-6) 100 MG tablet Take 100 mg by mouth.    . Vitamin D, Ergocalciferol, (DRISDOL) 50000 units CAPS capsule Take 50,000 Units by mouth every 7 (seven) days. Saturdays     No current facility-administered medications for this visit.     PHYSICAL EXAMINATION: ECOG PERFORMANCE STATUS: 1 - Symptomatic but completely ambulatory  Vitals:   12/30/16 1121  BP: (!) 162/74  Pulse: 63  Resp: 20  Temp: 98.4 F (36.9 C)   Filed Weights   12/30/16 1121  Weight: 175 lb (79.4 kg)    GENERAL:alert, no distress and comfortable SKIN: skin color, texture, turgor are normal, no rashes or significant lesions EYES: normal, Conjunctiva are pink and non-injected, sclera clear OROPHARYNX:no exudate, no erythema and lips, buccal mucosa, and tongue normal  NECK: supple, thyroid normal size, non-tender, without nodularity LYMPH:  no palpable lymphadenopathy in the cervical, axillary or inguinal LUNGS: clear to auscultation and percussion with normal breathing effort HEART: regular rate & rhythm and no murmurs and no lower extremity edema ABDOMEN:abdomen soft, non-tender and normal bowel sounds MUSCULOSKELETAL:no cyanosis of digits and no clubbing  NEURO: alert &  oriented x 3 with fluent speech, no focal motor/sensory deficits EXTREMITIES: No lower extremity edema   LABORATORY DATA:  I have reviewed the data as listed   Chemistry      Component Value Date/Time   NA 139 09/07/2016 1320   NA 140 07/11/2016 1100   K 4.4 09/07/2016 1320   K 4.1 07/11/2016 1100   CL 102 09/07/2016 1320   CO2 29 09/07/2016 1320   CO2 25 07/11/2016 1100   BUN 12 09/07/2016 1320   BUN 14.9 07/11/2016 1100   CREATININE 0.81 09/07/2016 1320   CREATININE 0.8 07/11/2016 1100      Component Value Date/Time   CALCIUM 9.6 09/07/2016 1320   CALCIUM 9.4 07/11/2016 1100   ALKPHOS 88 07/11/2016 1100   AST 28 07/11/2016 1100   ALT 31 07/11/2016 1100   BILITOT 0.86 07/11/2016 1100       Lab Results  Component Value Date   WBC 3.6 (L) 12/30/2016   HGB 12.8 12/30/2016   HCT 37.4 12/30/2016   MCV 98.4 12/30/2016   PLT 138 (L) 12/30/2016   NEUTROABS 2.3 12/30/2016    ASSESSMENT & PLAN:  Breast cancer of upper-inner quadrant of right female  breast ([place]) 02/29/2016: Right breast palpable mass (with silicone implants 5247), 3.5 cm on MRI, additional 3 cm anterior linear enhancement (biopsy 03/14/2016 IDC grade 3); 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  T2 N0 stage 2A clinical stage  Treatment summary: 1. Neoadjuvant chemotherapy with dose dense Adriamycin and Cytoxan 4 followed by Abraxane weekly 5(stopped early due to neuropathy) 2. 09/13/2016: Right lumpectomy: IDC grade 3, 2 foci, 2 cm and 1.1 cm, 0/3 lymph nodes negative margins negative, ER 0%, PR 0%, HER-2 negative ratio 1.02, Ki-67 60%, RCB-II; ypT2ypN0 Stage 2A (with plastic surgery removing the ruptured implant) 3. Followed by radiation therapy completed 12/06/2016 ----------------------------------------------------------------------------------------------------------------------------------------- Recommendation:  1. Adjuvant Xeloda 1000 mg by mouth twice a day 2 weeks on one week off To start  12/30/2016  2. After 3-4 months of Xeloda, we will plan to randomize the patient on SWOG 1418 clinical trial Pembrolizumab versus observation  Insomnia issues: Patient will take Cymbalta and see if it helps with depression and sleep issues. Our goal is to keep working full-time. She is very anxious about taking Xeloda because she is worried that she may have to take time off work with the diarrhea. I discussed with her that I'm giving her a very small dose of Xeloda. Patient does drink occasionally. Instructed her to limit the number of alcoholic drinks to 3-4 a week maximum.  Return to clinic in 2 weeks with labs and follow-up  I spent 25 minutes talking to the patient of which more than half was spent in counseling and coordination of care.  No orders of the defined types were placed in this encounter.  The patient has a good understanding of the overall plan. she agrees with it. she will call with any problems that may develop before the next visit here.   Rulon Eisenmenger, MD 12/30/16

## 2017-01-03 ENCOUNTER — Other Ambulatory Visit: Payer: Self-pay | Admitting: Hematology and Oncology

## 2017-01-03 DIAGNOSIS — Z171 Estrogen receptor negative status [ER-]: Principal | ICD-10-CM

## 2017-01-03 DIAGNOSIS — C50211 Malignant neoplasm of upper-inner quadrant of right female breast: Secondary | ICD-10-CM

## 2017-01-09 ENCOUNTER — Other Ambulatory Visit: Payer: Medicare Other

## 2017-01-09 ENCOUNTER — Ambulatory Visit: Payer: Medicare Other | Admitting: Hematology and Oncology

## 2017-01-13 ENCOUNTER — Other Ambulatory Visit (HOSPITAL_BASED_OUTPATIENT_CLINIC_OR_DEPARTMENT_OTHER): Payer: Medicare Other

## 2017-01-13 ENCOUNTER — Ambulatory Visit (HOSPITAL_BASED_OUTPATIENT_CLINIC_OR_DEPARTMENT_OTHER): Payer: Medicare Other

## 2017-01-13 ENCOUNTER — Encounter: Payer: Self-pay | Admitting: Hematology and Oncology

## 2017-01-13 ENCOUNTER — Ambulatory Visit (HOSPITAL_BASED_OUTPATIENT_CLINIC_OR_DEPARTMENT_OTHER): Payer: Medicare Other | Admitting: Hematology and Oncology

## 2017-01-13 DIAGNOSIS — Z171 Estrogen receptor negative status [ER-]: Secondary | ICD-10-CM

## 2017-01-13 DIAGNOSIS — C50211 Malignant neoplasm of upper-inner quadrant of right female breast: Secondary | ICD-10-CM | POA: Diagnosis not present

## 2017-01-13 DIAGNOSIS — Z95828 Presence of other vascular implants and grafts: Secondary | ICD-10-CM

## 2017-01-13 LAB — CBC WITH DIFFERENTIAL/PLATELET
BASO%: 1 % (ref 0.0–2.0)
BASOS ABS: 0 10*3/uL (ref 0.0–0.1)
EOS ABS: 0.1 10*3/uL (ref 0.0–0.5)
EOS%: 2.4 % (ref 0.0–7.0)
HEMATOCRIT: 37 % (ref 34.8–46.6)
HEMOGLOBIN: 12.6 g/dL (ref 11.6–15.9)
LYMPH#: 0.7 10*3/uL — AB (ref 0.9–3.3)
LYMPH%: 21.6 % (ref 14.0–49.7)
MCH: 33.9 pg (ref 25.1–34.0)
MCHC: 33.9 g/dL (ref 31.5–36.0)
MCV: 99.7 fL (ref 79.5–101.0)
MONO#: 0.3 10*3/uL (ref 0.1–0.9)
MONO%: 7.9 % (ref 0.0–14.0)
NEUT#: 2.1 10*3/uL (ref 1.5–6.5)
NEUT%: 67.1 % (ref 38.4–76.8)
Platelets: 147 10*3/uL (ref 145–400)
RBC: 3.71 10*6/uL (ref 3.70–5.45)
RDW: 14.9 % — AB (ref 11.2–14.5)
WBC: 3.2 10*3/uL — ABNORMAL LOW (ref 3.9–10.3)

## 2017-01-13 LAB — COMPREHENSIVE METABOLIC PANEL
ALBUMIN: 3.7 g/dL (ref 3.5–5.0)
ALK PHOS: 77 U/L (ref 40–150)
ALT: 47 U/L (ref 0–55)
AST: 50 U/L — AB (ref 5–34)
Anion Gap: 8 mEq/L (ref 3–11)
BUN: 13.6 mg/dL (ref 7.0–26.0)
CHLORIDE: 103 meq/L (ref 98–109)
CO2: 27 mEq/L (ref 22–29)
CREATININE: 0.8 mg/dL (ref 0.6–1.1)
Calcium: 9.4 mg/dL (ref 8.4–10.4)
EGFR: 75 mL/min/{1.73_m2} — ABNORMAL LOW (ref >90)
GLUCOSE: 227 mg/dL — AB (ref 70–140)
POTASSIUM: 4 meq/L (ref 3.5–5.1)
SODIUM: 138 meq/L (ref 136–145)
Total Bilirubin: 1 mg/dL (ref 0.20–1.20)
Total Protein: 6.5 g/dL (ref 6.4–8.3)

## 2017-01-13 MED ORDER — HEPARIN SOD (PORK) LOCK FLUSH 100 UNIT/ML IV SOLN
500.0000 [IU] | Freq: Once | INTRAVENOUS | Status: AC | PRN
  Administered 2017-01-13: 500 [IU] via INTRAVENOUS
  Filled 2017-01-13: qty 5

## 2017-01-13 MED ORDER — SODIUM CHLORIDE 0.9% FLUSH
10.0000 mL | INTRAVENOUS | Status: DC | PRN
  Administered 2017-01-13: 10 mL via INTRAVENOUS
  Filled 2017-01-13: qty 10

## 2017-01-13 NOTE — Progress Notes (Signed)
Patient Care Team: Tammi Sou, MD as PCP - General (Family Medicine) Alphonsa Overall, MD as Consulting Physician (General Surgery) Nicholas Lose, MD as Consulting Physician (Hematology and Oncology) Kyung Rudd, MD as Consulting Physician (Radiation Oncology) Irene Limbo, MD as Consulting Physician (Plastic Surgery) Gerome Apley, MD as Consulting Physician (Endocrinology)  DIAGNOSIS:  Encounter Diagnosis  Name Primary?  . Malignant neoplasm of upper-inner quadrant of right breast in female, estrogen receptor negative ([place])     SUMMARY OF ONCOLOGIC HISTORY:   Breast cancer of upper-inner quadrant of right female breast ([place])   02/29/2016 Initial Diagnosis    Right breast palpable mass (with silicone implants 4166), 3.5 cm on MRI, additional 3 cm anterior linear enhancement? 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 Ki-67 60%, T2 N0 stage 2A clinical stage      03/14/2016 Procedure    Right breast biopsy upper inner quadrant: IDC grade 3      03/28/2016 -  Neo-Adjuvant Chemotherapy    Neoadjuvant chemotherapy with dose dense Adriamycin and Cytoxan followed by Abraxane weekly 5 ( patient is diabetic and cannot take steroids)      07/15/2016 Breast MRI    Right breast: Spiculated mass 1.5 cm significantly smaller compared to prior,NME previously seen is not noted, no abnormal lymph nodes      09/13/2016 Surgery    Removal of the silicone implant due to intracapsular rupture and capsulectomy (Dr.Thimappa)      09/13/2016 Surgery    Right lumpectomy: IDC grade 3, 2 foci, 2 cm and 1.1 cm, 0/3 lymph nodes negative margins negative, ER 0%, PR 0%, HER-2 negative ratio 1.02, Ki-67 60%, RCB-II; ypT2ypN0 Stage 2A       10/20/2016 - 12/06/2016 Radiation Therapy    Adjuvant radiation therapy      12/30/2016 -  Chemotherapy    Xeloda 1000 mg by mouth twice a day adjuvant therapy        CHIEF COMPLIANT: Follow-up on Xeloda  INTERVAL HISTORY: Marissa Hall is a  76 year old with above-mentioned history of triple negative breast cancer was currently on adjuvant Xeloda. She was very anxious about starting Xeloda because of the risk of diarrhea. She tells me that she has no diarrhea. She denies any nausea or rashes. She is tolerating the treatment extremely well. Complains of bilateral neck pains not related to Xeloda.  REVIEW OF SYSTEMS:   Constitutional: Denies fevers, chills or abnormal weight loss Eyes: Denies blurriness of vision Ears, nose, mouth, throat, and face: Denies mucositis or sore throat Respiratory: Denies cough, dyspnea or wheezes Cardiovascular: Denies palpitation, chest discomfort Gastrointestinal:  Denies nausea, heartburn or change in bowel habits Skin: Denies abnormal skin rashes Lymphatics: Denies new lymphadenopathy or easy bruising Neurological:Denies numbness, tingling or new weaknesses Behavioral/Psych: Mood is stable, no new changes  Extremities: No lower extremity edema Breast:  denies any pain or lumps or nodules in either breasts All other systems were reviewed with the patient and are negative.  I have reviewed the past medical history, past surgical history, social history and family history with the patient and they are unchanged from previous note.  ALLERGIES:  is allergic to other and penicillins.  MEDICATIONS:  Current Outpatient Prescriptions  Medication Sig Dispense Refill  . atorvastatin (LIPITOR) 80 MG tablet Take 80 mg by mouth every evening.     [name] Kitchen azelastine (ASTELIN) 0.1 % nasal spray Place 2 sprays into both nostrils daily as needed for rhinitis or allergies. Use in each nostril as directed    .  DULoxetine (CYMBALTA) 30 MG capsule Take 1 capsule (30 mg total) by mouth daily. 30 capsule 0  . hyaluronate sodium (RADIAPLEXRX) GEL Apply 1 application topically 2 (two) times daily.    [name] Kitchen levothyroxine (SYNTHROID, LEVOTHROID) 100 MCG tablet Take 100 mcg by mouth daily before breakfast.     . lisinopril  (PRINIVIL,ZESTRIL) 20 MG tablet Take 20 mg by mouth every evening.     . magnesium oxide (MAG-OX) 400 (241.3 Mg) MG tablet Take 1.5 tablets (600 mg total) by mouth daily. 30 tablet 3  . metFORMIN (GLUCOPHAGE-XR) 500 MG 24 hr tablet Take 1,000 mg by mouth daily with supper.     [name] Kitchen omeprazole (PRILOSEC) 40 MG capsule Take 1 capsule (40 mg total) by mouth daily. 30 capsule 2  . pyridoxine (B-6) 100 MG tablet Take 100 mg by mouth.    . traZODone (DESYREL) 50 MG tablet 1-3 tabs po qhs prn anxiety 60 tablet 1  . Vitamin D, Ergocalciferol, (DRISDOL) 50000 units CAPS capsule Take 50,000 Units by mouth every 7 (seven) days. Saturdays    . XELODA 500 MG tablet TAKE 2 TABLETS (1,000 MG TOTAL) BY MOUTH 2 (TWO) TIMES DAILY AFTER A MEAL. TAKE FOR 14 DAYS ON, 7 DAYS OFF 56 tablet 0   No current facility-administered medications for this visit.     PHYSICAL EXAMINATION: ECOG PERFORMANCE STATUS: 1 - Symptomatic but completely ambulatory  Vitals:   01/13/17 1102  BP: (!) 138/47  Pulse: 60  Resp: 18  Temp: 98.3 F (36.8 C)  SpO2: 99%   Filed Weights   01/13/17 1102  Weight: 176 lb 11.2 oz (80.2 kg)    GENERAL:alert, no distress and comfortable SKIN: skin color, texture, turgor are normal, no rashes or significant lesions EYES: normal, Conjunctiva are pink and non-injected, sclera clear OROPHARYNX:no exudate, no erythema and lips, buccal mucosa, and tongue normal  NECK: supple, thyroid normal size, non-tender, without nodularity LYMPH:  no palpable lymphadenopathy in the cervical, axillary or inguinal LUNGS: clear to auscultation and percussion with normal breathing effort HEART: regular rate & rhythm and no murmurs and no lower extremity edema ABDOMEN:abdomen soft, non-tender and normal bowel sounds MUSCULOSKELETAL:no cyanosis of digits and no clubbing  NEURO: alert & oriented x 3 with fluent speech, no focal motor/sensory deficits EXTREMITIES: No lower extremity edema  LABORATORY DATA:  I  have reviewed the data as listed   Chemistry      Component Value Date/Time   NA 138 01/13/2017 1016   K 4.0 01/13/2017 1016   CL 102 09/07/2016 1320   CO2 27 01/13/2017 1016   BUN 13.6 01/13/2017 1016   CREATININE 0.8 01/13/2017 1016      Component Value Date/Time   CALCIUM 9.4 01/13/2017 1016   ALKPHOS 77 01/13/2017 1016   AST 50 (H) 01/13/2017 1016   ALT 47 01/13/2017 1016   BILITOT 1.00 01/13/2017 1016       Lab Results  Component Value Date   WBC 3.2 (L) 01/13/2017   HGB 12.6 01/13/2017   HCT 37.0 01/13/2017   MCV 99.7 01/13/2017   PLT 147 01/13/2017   NEUTROABS 2.1 01/13/2017    ASSESSMENT & PLAN:  Breast cancer of upper-inner quadrant of right female breast ([place]) 02/29/2016: Right breast palpable mass (with silicone implants 6270), 3.5 cm on MRI, additional 3 cm anterior linear enhancement (biopsy 03/14/2016 IDC grade 3); 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  T2 N0 stage 2A clinical stage  Treatment summary: 1. Neoadjuvant chemotherapy with dose  dense Adriamycin and Cytoxan 4 followed by Abraxane weekly 5(stopped early due to neuropathy) 2. 09/13/2016: Right lumpectomy: IDC grade 3, 2 foci, 2 cm and 1.1 cm, 0/3 lymph nodes negative margins negative, ER 0%, PR 0%, HER-2 negative ratio 1.02, Ki-67 60%, RCB-II; ypT2ypN0 Stage 2A (with plastic surgery removing the ruptured implant) 3. Followed by radiation therapy completed 12/06/2016 ----------------------------------------------------------------------------------------------------------------------------------------- Recommendation:  1. Adjuvant Xeloda 1000 mg by mouth twice a day 2 weeks on one week off started 12/30/2016  2. After 3-4 months of Xeloda, we will plan to randomize the patient on SWOG 1418 clinical trial Pembrolizumab versus observation  Chemotherapy-induced peripheral neuropathy: Patient will take Cymbalta And she tells me that it is helping her tremendously with neuropathy. Insomnia:  Patient takes trazodone at bedtime which is helping her as well  Xeloda toxicities: Denies any diarrhea or rashes.  Complains of neck pain not related to Xeloda.  Return to clinic in 8 weeks with labs and follow-up   I spent 25 minutes talking to the patient of which more than half was spent in counseling and coordination of care.  Orders Placed This Encounter  Procedures  . CBC with Differential    Standing Status:   Future    Standing Expiration Date:   01/13/2018  . Comprehensive metabolic panel    Standing Status:   Future    Standing Expiration Date:   01/13/2018   The patient has a good understanding of the overall plan. she agrees with it. she will call with any problems that may develop before the next visit here.   Rulon Eisenmenger, MD 01/13/17

## 2017-01-13 NOTE — Patient Instructions (Signed)
Implanted Port Insertion An implanted port is a central line that has a round shape and is placed under the skin. It is used as a long-term IV access for:   Medicines, such as chemotherapy.   Fluids.   Liquid nutrition, such as total parenteral nutrition (TPN).   Blood samples.  LET YOUR HEALTH CARE PROVIDER KNOW ABOUT:  Allergies to food or medicine.   Medicines taken, including vitamins, herbs, eye drops, creams, and over-the-counter medicines.   Any allergies to heparin.  Use of steroids (by mouth or creams).   Previous problems with anesthetics or numbing medicines.   History of bleeding problems or blood clots.   Previous surgery.   Other health problems, including diabetes and kidney problems.   Possibility of pregnancy, if this applies. RISKS AND COMPLICATIONS Generally, this is a safe procedure. However, as with any procedure, problems can occur. Possible problems include:  Damage to the blood vessel, bruising, or bleeding at the puncture site.   Infection.  Blood clot in the vessel that the port is in.  Breakdown of the skin over your port.  Very rarely a person may develop a condition called a pneumothorax, a collection of air in the chest that may cause one of the lungs to collapse. The placement of these catheters with the appropriate imaging guidance significantly decreases the risk of a pneumothorax.  BEFORE THE PROCEDURE   Your health care provider may want you to have blood tests. These tests can help tell how well your kidneys and liver are working. They can also show how well your blood clots.   If you take blood thinners (anticoagulant medicines), ask your health care provider when you should stop taking them.   Make arrangements for someone to drive you home. This is necessary if you have been sedated for your procedure.  PROCEDURE  Port insertion usually takes about 30-45 minutes.   An IV needle will be inserted in your arm.  Medicine for pain and medicine to help relax you (sedative) will flow directly into your body through this needle.   You will lie on an exam table, and you will be connected to monitors to keep track of your heart rate, blood pressure, and breathing throughout the procedure.  An oxygen monitoring device may be attached to your finger. Oxygen will be given.   Everything will be kept as germ free (sterile) as possible during the procedure. The skin near the point of the incision will be cleansed with antiseptic, and the area will be draped with sterile towels. The skin and deeper tissues over the port area will be made numb with a local anesthetic.  Two small cuts (incisions) will be made in the skin to insert the port. One will be made in the neck to obtain access to the vein where the catheter will lie.   Because the port reservoir will be placed under the skin, a small skin incision will be made in the upper chest, and a small pocket for the port will be made under the skin. The catheter that will be connected to the port tunnels to a large central vein in the chest. A small, raised area will remain on your body at the site of the reservoir when the procedure is complete.  The port placement will be done under imaging guidance to ensure the proper placement.  The reservoir has a silicone covering that can be punctured with a special needle.   The port will be flushed with normal   saline, and blood will be drawn to make sure it is working properly.  There will be nothing remaining outside the skin when the procedure is finished.   Incisions will be held together by stitches, surgical glue, or a special tape. AFTER THE PROCEDURE  You will stay in a recovery area until the anesthesia has worn off. Your blood pressure and pulse will be checked.  A final chest X-ray will be taken to check the placement of the port and to ensure that there is no injury to your lung. This information is not  intended to replace advice given to you by your health care provider. Make sure you discuss any questions you have with your health care provider. Document Released: 03/13/2013 Document Revised: 06/13/2014 Document Reviewed: 03/13/2013 Elsevier Interactive Patient Education  2017 [employer]  

## 2017-01-13 NOTE — Assessment & Plan Note (Addendum)
02/29/2016: Right breast palpable mass (with silicone implants 5947), 3.5 cm on MRI, additional 3 cm anterior linear enhancement (biopsy 03/14/2016 IDC grade 3); 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  T2 N0 stage 2A clinical stage  Treatment summary: 1. Neoadjuvant chemotherapy with dose dense Adriamycin and Cytoxan 4 followed by Abraxane weekly 5(stopped early due to neuropathy) 2. 09/13/2016: Right lumpectomy: IDC grade 3, 2 foci, 2 cm and 1.1 cm, 0/3 lymph nodes negative margins negative, ER 0%, PR 0%, HER-2 negative ratio 1.02, Ki-67 60%, RCB-II; ypT2ypN0 Stage 2A (with plastic surgery removing the ruptured implant) 3. Followed by radiation therapy completed 12/06/2016 ----------------------------------------------------------------------------------------------------------------------------------------- Recommendation:  1. Adjuvant Xeloda 1000 mg by mouth twice a day 2 weeks on one week off started 12/30/2016  2. After 3-4 months of Xeloda, we will plan to randomize the patient on SWOG 1418 clinical trial Pembrolizumab versus observation  Insomnia issues: Patient will take Cymbalta and see if it helps with depression and sleep issues.  Xeloda toxicities:  Return to clinic in 4 weeks with labs and follow-up

## 2017-01-20 MED FILL — XELODA 500 MG TABLET: 500 | 21 days supply | Qty: 56 | Fill #0

## 2017-01-30 ENCOUNTER — Ambulatory Visit: Admission: RE | Admit: 2017-01-30 | Payer: Medicare Other | Source: Ambulatory Visit | Admitting: Radiation Oncology

## 2017-02-03 ENCOUNTER — Encounter: Payer: Self-pay | Admitting: General Practice

## 2017-02-03 NOTE — Progress Notes (Signed)
[facility] Spiritual Care Note  Reached Marissa Hall by phone to let her know that we unfortunately do not have a counseling intern this fall.  She plans to f/u with a counselor whom her doctor recommended instead.  Encouraged her to pursue a good fit as an extra layer of support and to contact [facility] if any other needs/questions arise. She verbalized appreciation.   [place], [place], [facility] Pager [phone] Voicemail [phone]

## 2017-02-09 ENCOUNTER — Encounter: Payer: Self-pay | Admitting: Family Medicine

## 2017-02-13 ENCOUNTER — Other Ambulatory Visit: Payer: Self-pay | Admitting: Hematology and Oncology

## 2017-02-13 ENCOUNTER — Encounter: Payer: Self-pay | Admitting: Family Medicine

## 2017-02-13 DIAGNOSIS — Z171 Estrogen receptor negative status [ER-]: Principal | ICD-10-CM

## 2017-02-13 DIAGNOSIS — C50211 Malignant neoplasm of upper-inner quadrant of right female breast: Secondary | ICD-10-CM

## 2017-02-13 MED FILL — XELODA 500 MG TABLET: 500 | 21 days supply | Qty: 56 | Fill #0

## 2017-02-14 ENCOUNTER — Encounter: Payer: Medicare Other | Admitting: Adult Health

## 2017-02-14 ENCOUNTER — Telehealth: Payer: Self-pay | Admitting: Pharmacy Technician

## 2017-02-14 NOTE — Progress Notes (Deleted)
CLINIC:  Survivorship   REASON FOR VISIT:  Routine follow-up post-treatment for a recent history of breast cancer.  BRIEF ONCOLOGIC HISTORY:    Breast cancer of upper-inner quadrant of right female breast ([place])   02/29/2016 Initial Diagnosis    Right breast palpable mass (with silicone implants 4132), 3.5 cm on MRI, additional 3 cm anterior linear enhancement? 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 Ki-67 60%, T2 N0 stage 2A clinical stage      03/14/2016 Procedure    Right breast biopsy upper inner quadrant: IDC grade 3      03/28/2016 -  Neo-Adjuvant Chemotherapy    Neoadjuvant chemotherapy with dose dense Adriamycin and Cytoxan followed by Abraxane weekly 5 ( patient is diabetic and cannot take steroids)      07/15/2016 Breast MRI    Right breast: Spiculated mass 1.5 cm significantly smaller compared to prior,NME previously seen is not noted, no abnormal lymph nodes      09/13/2016 Surgery    Removal of the silicone implant due to intracapsular rupture and capsulectomy (Dr.Thimappa)      09/13/2016 Surgery    Right lumpectomy: IDC grade 3, 2 foci, 2 cm and 1.1 cm, 0/3 lymph nodes negative margins negative, ER 0%, PR 0%, HER-2 negative ratio 1.02, Ki-67 60%, RCB-II; ypT2ypN0 Stage 2A       10/20/2016 - 12/06/2016 Radiation Therapy    Adjuvant radiation therapy      12/30/2016 -  Chemotherapy    Xeloda 1000 mg by mouth twice a day adjuvant therapy        INTERVAL HISTORY:  Ms. Axelson presents to the [facility] today for our initial meeting to review her survivorship care plan detailing her treatment course for breast cancer, as well as monitoring long-term side effects of that treatment, education regarding health maintenance, screening, and overall wellness and health promotion.     Overall, Ms. Phagan reports feeling quite well since completing her radiation therapy approximately 3 months ago.  She ***    REVIEW OF SYSTEMS:  Review of Systems -  Oncology Breast: Denies any new nodularity, masses, tenderness, nipple changes, or nipple discharge.      ONCOLOGY TREATMENT TEAM:  1. Surgeon:  Dr. [name] Kitchen at [facility] 2. Medical Oncologist: Dr. [name] Kitchen  3. Radiation Oncologist: Dr. [name] Kitchen    PAST MEDICAL/SURGICAL HISTORY:  Past Medical History:  Diagnosis Date  . Anxiety and depression    oncologist started duloxetine 12/2016  . Breast cancer ([place])    Clinical stage 2A: (triple neg): Right breast, upper inner quadrant, 03/2016.  Neoadjuvant chemo x 5 cycles, then lumpectomy 4 mo later, then radiation started 10/2016.  Adjuvant Xeloda started 01/2017.  [name] Kitchen Chemotherapy-induced neuropathy ([place])    feet; responding well to cymbalta  . Depression   . 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  [name] Kitchen GERD (gastroesophageal reflux disease)   . Hypertension   . Hypothyroidism    managed by endo.  . Osteoporosis    pt states "osteopenia", but then says that she refused to take the rx med for this condition, so I suspect she had osteoporosis.  [name] Kitchen TIA (transient ischemic attack)    Past Surgical History:  Procedure Laterality Date  . ABDOMINAL HYSTERECTOMY  1972  . APPENDECTOMY  1972  . BREAST ENHANCEMENT SURGERY  1982  . BREAST IMPLANT REMOVAL Right 09/13/2016   Procedure: REMOVAL RIGHT BREAST IMPLANT;  Surgeon: Irene Limbo, MD;  Location: [facility];  Service: Clinical cytogeneticist;  Laterality: Right;  . BREAST LUMPECTOMY WITH RADIOACTIVE SEED AND SENTINEL LYMPH NODE BIOPSY Right 09/13/2016   Procedure: RIGHT BREAST LUMPECTOMY WITH RADIOACTIVE SEED X 2 AND SENTINEL LYMPH NODE BIOPSY;  Surgeon: Alphonsa Overall, MD;  Location: [facility];  Service: General;  Laterality: Right;  . BREAST SURGERY     breast biopsy  . CAPSULECTOMY Right 09/13/2016   Procedure: RIGHT CAPSULECTOMY;  Surgeon: Irene Limbo, MD;  Location: [facility];  Service: Plastics;  Laterality: Right;  . PORTACATH PLACEMENT  N/A 03/15/2016   Procedure: INSERTION PORT-A-CATH WITH [place];  Surgeon: Alphonsa Overall, MD;  Location: [facility];  Service: General;  Laterality: N/A;  . surgical repair left ankle  2009  . TONSILLECTOMY AND ADENOIDECTOMY     REMOTE past     ALLERGIES:  Allergies  Allergen Reactions  . Other Other (See Comments)    STEROIDS- emotional  . Penicillins Other (See Comments)    Unsure of reaction, was 76 years old     CURRENT MEDICATIONS:  Outpatient Encounter Prescriptions as of 02/14/2017  Medication Sig  . atorvastatin (LIPITOR) 80 MG tablet Take 80 mg by mouth every evening.   [name] Kitchen azelastine (ASTELIN) 0.1 % nasal spray Place 2 sprays into both nostrils daily as needed for rhinitis or allergies. Use in each nostril as directed  . DULoxetine (CYMBALTA) 30 MG capsule Take 1 capsule (30 mg total) by mouth daily.  . hyaluronate sodium (RADIAPLEXRX) GEL Apply 1 application topically 2 (two) times daily.  [name] Kitchen levothyroxine (SYNTHROID, LEVOTHROID) 100 MCG tablet Take 100 mcg by mouth daily before breakfast.   . lisinopril (PRINIVIL,ZESTRIL) 20 MG tablet Take 20 mg by mouth every evening.   . magnesium oxide (MAG-OX) 400 (241.3 Mg) MG tablet Take 1.5 tablets (600 mg total) by mouth daily.  . metFORMIN (GLUCOPHAGE-XR) 500 MG 24 hr tablet Take 1,000 mg by mouth daily with supper.   [name] Kitchen omeprazole (PRILOSEC) 40 MG capsule Take 1 capsule (40 mg total) by mouth daily.  [name] Kitchen pyridoxine (B-6) 100 MG tablet Take 100 mg by mouth.  . traZODone (DESYREL) 50 MG tablet 1-3 tabs po qhs prn anxiety  . Vitamin D, Ergocalciferol, (DRISDOL) 50000 units CAPS capsule Take 50,000 Units by mouth every 7 (seven) days. Saturdays  . XELODA 500 MG tablet TAKE 2 TABLETS BY MOUTH 2 TIMES DAILY AFTER A MEAL. TAKE FOR 14 DAYS ON, 7 DAYS OFF   No facility-administered encounter medications on file as of 02/14/2017.      ONCOLOGIC FAMILY HISTORY:  Family History  Problem Relation Age of Onset  . Stroke Mother   . Suicidality Father     . Stroke Brother   . Stroke Son      GENETIC COUNSELING/TESTING: ***  SOCIAL HISTORY:  Ying Blankenhorn is /single/married/divorced/widowed/separated and lives alone/with her spouse/family/friend in (city), [place].  She has (#) children and they live in (city).  Ms. Baise is currently retired/disabled/working part-time/full-time as ***.  She denies any current or history of tobacco, alcohol, or illicit drug use.     PHYSICAL EXAMINATION:  Vital Signs:  There were no vitals filed for this visit. There were no vitals filed for this visit. General: Well-nourished, well-appearing female in no acute distress.  She is unaccompanied/accompanied in clinic by her ***** today.   HEENT: Head is normocephalic.  Pupils equal and reactive to light. Conjunctivae clear without exudate.  Sclerae anicteric. Oral mucosa is pink, moist.  Oropharynx is pink without lesions or  erythema.  Lymph: No cervical, supraclavicular, or infraclavicular lymphadenopathy noted on palpation.  Cardiovascular: Regular rate and rhythm.[name] Kitchen Respiratory: Clear to auscultation bilaterally. Chest expansion symmetric; breathing non-labored.  GI: Abdomen soft and round; non-tender, non-distended. Bowel sounds normoactive.  GU: Deferred.  Neuro: No focal deficits. Steady gait.  Psych: Mood and affect normal and appropriate for situation.  Extremities: No edema. MSK: No focal spinal tenderness to palpation.  Full range of motion in bilateral upper extremities Skin: Warm and dry.  LABORATORY DATA:  None for this visit.  DIAGNOSTIC IMAGING:  None for this visit.      ASSESSMENT AND PLAN:  Ms.. Dowson is a pleasant 76 y.o. female with Stage *** right/left breast invasive ductal carcinoma, ER+/PR+/HER2-, diagnosed in (date), treated with lumpectomy, adjuvant radiation therapy, and anti-estrogen therapy with *** beginning in (date).  She presents to the Survivorship Clinic for our initial meeting and routine follow-up  post-completion of treatment for breast cancer.    1. Stage *** right/left breast cancer:  Ms. Sittner is continuing to recover from definitive treatment for breast cancer. She will follow-up with her medical oncologist, Dr. Ross Ludwig in (month) /2017 with history and physical exam per surveillance protocol.  She will continue her anti-estrogen therapy with (drug). Thus far, she is tolerating the *** well, with minimal side effects. She was instructed to make Dr. Lindi Adie or myself aware if she begins to experience any worsening side effects of the medication and I could see her back in clinic to help manage those side effects, as needed. Though the incidence is low, there is an associated risk of endometrial cancer with anti-estrogen therapies like Tamoxifen.  Ms. Shands was encouraged to contact Dr. Carrington Clamp or myself with any vaginal bleeding while taking Tamoxifen. Other side effects of Tamoxifen were again reviewed with her as well. Today, a comprehensive survivorship care plan and treatment summary was reviewed with the patient today detailing her breast cancer diagnosis, treatment course, potential late/long-term effects of treatment, appropriate follow-up care with recommendations for the future, and patient education resources.  A copy of this summary, along with a letter will be sent to the patient's primary care provider via mail/fax/In Basket message after today's visit.    #. Problem(s) at Visit______________  #. Bone health:  Given Ms. Mulvehill age/history of breast cancer and her current treatment regimen including anti-estrogen therapy with _______, she is at risk for bone demineralization.  Her last DEXA scan was **/**/20**, which showed (results).***  In the meantime, she was encouraged to increase her consumption of foods rich in calcium, as well as increase her weight-bearing activities.  She was given education on specific activities to promote bone health.  #.  Cancer screening:  Due to Ms. Lienhard history and her age, she should receive screening for skin cancers, colon cancer, and gynecologic cancers.  The information and recommendations are listed on the patient's comprehensive care plan/treatment summary and were reviewed in detail with the patient.    #. Health maintenance and wellness promotion: Ms. Cranford was encouraged to consume 5-7 servings of fruits and vegetables per day. We reviewed the "Nutrition Rainbow" handout, as well as the handout "Take Control of Your Health and Reduce Your Cancer Risk" from the [facility].  She was also encouraged to engage in moderate to vigorous exercise for 30 minutes per day most days of the week. We discussed the [employer] fitness program, which is designed for cancer survivors to help them become more physically fit after cancer treatments.  She was instructed to limit her alcohol consumption and continue to abstain from tobacco use/***was encouraged stop smoking.     #. Support services/counseling: It is not uncommon for this period of the patient's cancer care trajectory to be one of many emotions and stressors.  We discussed an opportunity for her to participate in the next session of [facility] ("Finding Your New Normal") support group series designed for patients after they have completed treatment.   Ms. Darwin was encouraged to take advantage of our many other support services programs, support groups, and/or counseling in coping with her new life as a cancer survivor after completing anti-cancer treatment.  She was offered support today through active listening and expressive supportive counseling.  She was given information regarding our available services and encouraged to contact me with any questions or for help enrolling in any of our support group/programs.    Dispo:   -Return to cancer center ***  -Mammogram due in *** -Follow up with surgery *** -She is welcome to return back to the  Survivorship Clinic at any time; no additional follow-up needed at this time.  -Consider referral back to survivorship as a long-term survivor for continued surveillance  A total of (30) minutes of face-to-face time was spent with this patient with greater than 50% of that time in counseling and care-coordination.   Gardenia Phlegm, NP Survivorship Program [facility] [phone]   Note: PRIMARY CARE PROVIDER Tammi Sou, [place] [phone]

## 2017-02-14 NOTE — Telephone Encounter (Signed)
Oral Oncology Patient Advocate Encounter  Was successful in securing patient a $4000 grant from [employer] to provide copayment coverage for Xeloda.  This will keep the out of pocket expense at $0.    I spoke with the patient today  The billing information is as follows and has been shared with [place].   Member ID: [id number] Group ID: [id number]  RxBin: [place]. Melynda Keller, [place] Patient [place] [phone] 02/14/2017 1:02 PM

## 2017-02-16 ENCOUNTER — Encounter: Payer: Self-pay | Admitting: *Deleted

## 2017-02-16 ENCOUNTER — Ambulatory Visit
Admission: RE | Admit: 2017-02-16 | Discharge: 2017-02-16 | Disposition: A | Discharge status: Home / Self Care | Payer: Medicare Other | Source: Ambulatory Visit | Attending: Radiation Oncology | Admitting: Radiation Oncology

## 2017-02-16 NOTE — Progress Notes (Signed)
[place] she forgot her appointment scheduled for this morning.  Reported she is doing fine, skin to her right breast is intact and the color is back to normal she used a lotion with vitamin E. She is eating,still feels a little tired at times overall is doing well and does not have to come in to be seen. Told to use a sun screen lotion for the next year and continue to use a lotion with vitamin E to her right breast until her full color returns.  Keep her 03-08-17 appointment with Dr. Lindi Adie and to call Radiation oncology if you need our service per Shona Simpson, P.A. from [place].

## 2017-02-16 NOTE — Progress Notes (Incomplete)
Marissa Hall she forgot her appointment scheduled for this morning.  Reported she is doing fine, skin to her right breast is intact and the color is back to normal she used a lotion with vitamin E. She is eating,still feels a little tired

## 2017-02-23 DIAGNOSIS — E039 Hypothyroidism, unspecified: Secondary | ICD-10-CM | POA: Diagnosis not present

## 2017-02-27 ENCOUNTER — Other Ambulatory Visit: Payer: Self-pay | Admitting: Family Medicine

## 2017-02-27 DIAGNOSIS — E1143 Type 2 diabetes mellitus with diabetic autonomic (poly)neuropathy: Secondary | ICD-10-CM | POA: Diagnosis not present

## 2017-02-27 DIAGNOSIS — E039 Hypothyroidism, unspecified: Secondary | ICD-10-CM | POA: Diagnosis not present

## 2017-02-27 DIAGNOSIS — I10 Essential (primary) hypertension: Secondary | ICD-10-CM | POA: Diagnosis not present

## 2017-02-27 DIAGNOSIS — E785 Hyperlipidemia, unspecified: Secondary | ICD-10-CM | POA: Diagnosis not present

## 2017-02-27 DIAGNOSIS — E1169 Type 2 diabetes mellitus with other specified complication: Secondary | ICD-10-CM | POA: Diagnosis not present

## 2017-02-27 DIAGNOSIS — E1159 Type 2 diabetes mellitus with other circulatory complications: Secondary | ICD-10-CM | POA: Diagnosis not present

## 2017-02-27 NOTE — Telephone Encounter (Signed)
Patient scheduled appt 03/06/17 @ 8:45am.

## 2017-02-27 NOTE — Telephone Encounter (Signed)
[place].  Refill: Duloxetine  At [facility] Dr. Anitra Lauth wanted pt to f/u in one month.   Will refill Rx for #30 w/ 0Rf, need f/u o/v for more refills.   Left message for pt to call back.

## 2017-03-02 DIAGNOSIS — Z853 Personal history of malignant neoplasm of breast: Secondary | ICD-10-CM | POA: Insufficient documentation

## 2017-03-02 DIAGNOSIS — Z923 Personal history of irradiation: Secondary | ICD-10-CM | POA: Insufficient documentation

## 2017-03-03 ENCOUNTER — Encounter: Payer: Self-pay | Admitting: *Deleted

## 2017-03-03 DIAGNOSIS — F419 Anxiety disorder, unspecified: Secondary | ICD-10-CM

## 2017-03-03 DIAGNOSIS — F329 Major depressive disorder, single episode, unspecified: Secondary | ICD-10-CM | POA: Insufficient documentation

## 2017-03-03 DIAGNOSIS — F32A Depression, unspecified: Secondary | ICD-10-CM | POA: Insufficient documentation

## 2017-03-06 ENCOUNTER — Ambulatory Visit (INDEPENDENT_AMBULATORY_CARE_PROVIDER_SITE_OTHER): Payer: Medicare Other | Admitting: Family Medicine

## 2017-03-06 ENCOUNTER — Encounter: Payer: Self-pay | Admitting: Family Medicine

## 2017-03-06 VITALS — BP 131/64 | HR 53 | Temp 98.2°F | Resp 16 | Ht 68.5 in | Wt 176.0 lb

## 2017-03-06 DIAGNOSIS — F4322 Adjustment disorder with anxiety: Secondary | ICD-10-CM

## 2017-03-06 DIAGNOSIS — F19982 Other psychoactive substance use, unspecified with psychoactive substance-induced sleep disorder: Secondary | ICD-10-CM

## 2017-03-06 DIAGNOSIS — F320 Major depressive disorder, single episode, mild: Secondary | ICD-10-CM | POA: Diagnosis not present

## 2017-03-06 MED ORDER — TRAZODONE HCL 50 MG PO TABS: ORAL_TABLET | ORAL | 3 refills | Status: DC

## 2017-03-06 MED ORDER — DULOXETINE HCL 30 MG PO CPEP: 30.0000 mg | ORAL_CAPSULE | Freq: Every day | ORAL | 1 refills | Status: DC

## 2017-03-06 NOTE — Progress Notes (Signed)
OFFICE VISIT  03/06/2017   CC:  Chief Complaint  Patient presents with  . Follow-up    Depression and Anxiety   HPI:    Patient is a 76 y.o. Caucasian female who presents for f/u anxiety and depression. Last f/u with me 2 mo ago I restarted her on cymbalta 30 mg, also started trazodone for insomnia that she felt was being caused by the cymbalta.  Appetite is good.  Cymbalta also helping her LE neuropathy. Mood and anxiety well controlled.  Has to take only 1/2 trazedone hs.     Very fatigued lately, TSH was up some--endo adjusting med. She has gone through radiation, is also in the midst of chemo as well.   Past Medical History:  Diagnosis Date  . Anxiety and depression    oncologist started duloxetine 12/2016  . Breast cancer ([place])    Clinical stage 2A: (triple neg): Right breast, upper inner quadrant, 03/2016.  Neoadjuvant chemo x 5 cycles, then lumpectomy 4 mo later, then radiation started 10/2016.  Adjuvant Xeloda started 01/2017.  [name] Kitchen Chemotherapy-induced neuropathy ([place])    feet; responding well to cymbalta  . Depression   . Diabetes ([place])    with painful PN-managed by endo.  [name] Kitchen GERD (gastroesophageal reflux disease)   . Hypertension   . Hypothyroidism    managed by endo.  . Osteoporosis    pt states "osteopenia", but then says that she refused to take the rx med for this condition, so I suspect she had osteoporosis.  [name] Kitchen TIA (transient ischemic attack)     Past Surgical History:  Procedure Laterality Date  . ABDOMINAL HYSTERECTOMY  1972  . APPENDECTOMY  1972  . BREAST ENHANCEMENT SURGERY  1982  . BREAST IMPLANT REMOVAL Right 09/13/2016   Procedure: REMOVAL RIGHT BREAST IMPLANT;  Surgeon: Irene Limbo, MD;  Location: [facility];  Service: Plastics;  Laterality: Right;  . BREAST LUMPECTOMY WITH RADIOACTIVE SEED AND SENTINEL LYMPH NODE BIOPSY Right 09/13/2016   Procedure: RIGHT BREAST LUMPECTOMY WITH RADIOACTIVE SEED X 2 AND SENTINEL LYMPH NODE BIOPSY;   Surgeon: Alphonsa Overall, MD;  Location: [facility];  Service: General;  Laterality: Right;  . BREAST SURGERY     breast biopsy  . CAPSULECTOMY Right 09/13/2016   Procedure: RIGHT CAPSULECTOMY;  Surgeon: Irene Limbo, MD;  Location: [facility];  Service: Plastics;  Laterality: Right;  . PORTACATH PLACEMENT N/A 03/15/2016   Procedure: INSERTION PORT-A-CATH WITH [place];  Surgeon: Alphonsa Overall, MD;  Location: [facility];  Service: General;  Laterality: N/A;  . surgical repair left ankle  2009  . TONSILLECTOMY AND ADENOIDECTOMY     REMOTE past    Outpatient Medications Prior to Visit  Medication Sig Dispense Refill  . azelastine (ASTELIN) 0.1 % nasal spray Place 2 sprays into both nostrils daily as needed for rhinitis or allergies. Use in each nostril as directed    . hyaluronate sodium (RADIAPLEXRX) GEL Apply 1 application topically 2 (two) times daily.    [name] Kitchen lisinopril (PRINIVIL,ZESTRIL) 20 MG tablet Take 20 mg by mouth every evening.     . magnesium oxide (MAG-OX) 400 (241.3 Mg) MG tablet Take 1.5 tablets (600 mg total) by mouth daily. 30 tablet 3  . metFORMIN (GLUCOPHAGE-XR) 500 MG 24 hr tablet Take 1,000 mg by mouth daily with supper.     [name] Kitchen omeprazole (PRILOSEC) 40 MG capsule Take 1 capsule (40 mg total) by mouth daily. 30 capsule 2  . pyridoxine (B-6) 100 MG tablet  Take 100 mg by mouth.    . Vitamin D, Ergocalciferol, (DRISDOL) 50000 units CAPS capsule Take 50,000 Units by mouth every 7 (seven) days. Saturdays    . XELODA 500 MG tablet TAKE 2 TABLETS BY MOUTH 2 TIMES DAILY AFTER A MEAL. TAKE FOR 14 DAYS ON, 7 DAYS OFF 56 tablet 0  . DULoxetine (CYMBALTA) 30 MG capsule TAKE ONE CAPSULE BY MOUTH EVERY DAY 30 capsule 0  . traZODone (DESYREL) 50 MG tablet 1-3 tabs po qhs prn anxiety 60 tablet 1  . atorvastatin (LIPITOR) 80 MG tablet Take 80 mg by mouth every evening.     [name] Kitchen levothyroxine (SYNTHROID, LEVOTHROID) 100 MCG tablet Take 100 mcg by mouth daily before breakfast.       No facility-administered medications prior to visit.     Allergies  Allergen Reactions  . Other Other (See Comments)    STEROIDS- emotional  . Penicillins Other (See Comments)    Unsure of reaction, was 76 years old    ROS As per HPI  PE: Blood pressure 131/64, pulse (!) 53, temperature 98.2 F (36.8 C), temperature source Oral, resp. rate 16, height 5' 8.5" (1.74 m), weight 176 lb (79.8 kg), SpO2 98 %. Gen: Alert, well appearing.  Patient is oriented to person, place, time, and situation. AFFECT: pleasant, lucid thought and speech. CV: RRR, no m/r/g.   LUNGS: CTA bilat, nonlabored resps, good aeration in all lung fields. EXT: no clubbing, cyanosis, or edema.    LABS:    Chemistry      Component Value Date/Time   NA 138 01/13/2017 1016   K 4.0 01/13/2017 1016   CL 102 09/07/2016 1320   CO2 27 01/13/2017 1016   BUN 13.6 01/13/2017 1016   CREATININE 0.8 01/13/2017 1016      Component Value Date/Time   CALCIUM 9.4 01/13/2017 1016   ALKPHOS 77 01/13/2017 1016   AST 50 (H) 01/13/2017 1016   ALT 47 01/13/2017 1016   BILITOT 1.00 01/13/2017 1016     IMPRESSION AND PLAN:  1) MDD, remission:  Continue cymbalta 30 mg qd.  2) Adjustment d/o with anxiety: improved on cymbalta as well.  3) Insomnia: ? Med related (cymbalta?)--doing well with 1/2 of 50 mg trazodone hs prn.  An After Visit Summary was printed and given to the patient.  FOLLOW UP: Return in about 6 months (around 09/04/2017) for routine chronic illness f/u.  Signed:  Crissie Sickles, MD           03/06/2017

## 2017-03-08 ENCOUNTER — Telehealth: Payer: Self-pay

## 2017-03-08 ENCOUNTER — Other Ambulatory Visit (HOSPITAL_BASED_OUTPATIENT_CLINIC_OR_DEPARTMENT_OTHER): Payer: Medicare Other

## 2017-03-08 ENCOUNTER — Ambulatory Visit (HOSPITAL_BASED_OUTPATIENT_CLINIC_OR_DEPARTMENT_OTHER): Payer: Medicare Other | Admitting: Hematology and Oncology

## 2017-03-08 ENCOUNTER — Ambulatory Visit (HOSPITAL_BASED_OUTPATIENT_CLINIC_OR_DEPARTMENT_OTHER): Payer: Medicare Other

## 2017-03-08 VITALS — BP 182/71 | HR 77 | Temp 98.3°F | Resp 18 | Ht 68.5 in | Wt 175.4 lb

## 2017-03-08 DIAGNOSIS — Z171 Estrogen receptor negative status [ER-]: Principal | ICD-10-CM

## 2017-03-08 DIAGNOSIS — G4700 Insomnia, unspecified: Secondary | ICD-10-CM

## 2017-03-08 DIAGNOSIS — C50211 Malignant neoplasm of upper-inner quadrant of right female breast: Secondary | ICD-10-CM

## 2017-03-08 DIAGNOSIS — Z95828 Presence of other vascular implants and grafts: Secondary | ICD-10-CM

## 2017-03-08 LAB — CBC WITH DIFFERENTIAL/PLATELET
BASO%: 0.9 % (ref 0.0–2.0)
Basophils Absolute: 0 10*3/uL (ref 0.0–0.1)
EOS%: 2.4 % (ref 0.0–7.0)
Eosinophils Absolute: 0.1 10*3/uL (ref 0.0–0.5)
HCT: 38.6 % (ref 34.8–46.6)
HEMOGLOBIN: 13.1 g/dL (ref 11.6–15.9)
LYMPH#: 0.9 10*3/uL (ref 0.9–3.3)
LYMPH%: 28.4 % (ref 14.0–49.7)
MCH: 35.3 pg — AB (ref 25.1–34.0)
MCHC: 33.9 g/dL (ref 31.5–36.0)
MCV: 104 fL — AB (ref 79.5–101.0)
MONO#: 0.3 10*3/uL (ref 0.1–0.9)
MONO%: 8.2 % (ref 0.0–14.0)
NEUT#: 2 10*3/uL (ref 1.5–6.5)
NEUT%: 60.1 % (ref 38.4–76.8)
Platelets: 135 10*3/uL — ABNORMAL LOW (ref 145–400)
RBC: 3.71 10*6/uL (ref 3.70–5.45)
RDW: 15.4 % — ABNORMAL HIGH (ref 11.2–14.5)
WBC: 3.3 10*3/uL — ABNORMAL LOW (ref 3.9–10.3)
nRBC: 0 % (ref 0–0)

## 2017-03-08 LAB — COMPREHENSIVE METABOLIC PANEL
ALBUMIN: 4.1 g/dL (ref 3.5–5.0)
ALK PHOS: 88 U/L (ref 40–150)
ALT: 56 U/L — ABNORMAL HIGH (ref 0–55)
AST: 69 U/L — AB (ref 5–34)
Anion Gap: 10 mEq/L (ref 3–11)
BUN: 13.7 mg/dL (ref 7.0–26.0)
CHLORIDE: 105 meq/L (ref 98–109)
CO2: 25 meq/L (ref 22–29)
Calcium: 9.5 mg/dL (ref 8.4–10.4)
Creatinine: 0.8 mg/dL (ref 0.6–1.1)
EGFR: 69 mL/min/{1.73_m2} — AB (ref >90)
GLUCOSE: 211 mg/dL — AB (ref 70–140)
POTASSIUM: 4 meq/L (ref 3.5–5.1)
SODIUM: 140 meq/L (ref 136–145)
Total Bilirubin: 1.33 mg/dL — ABNORMAL HIGH (ref 0.20–1.20)
Total Protein: 7.2 g/dL (ref 6.4–8.3)

## 2017-03-08 MED ORDER — SODIUM CHLORIDE 0.9% FLUSH
10.0000 mL | INTRAVENOUS | Status: DC | PRN
Start: 2017-03-08 — End: 2017-03-08
  Administered 2017-03-08: 10 mL via INTRAVENOUS
  Filled 2017-03-08: qty 10

## 2017-03-08 MED ORDER — HEPARIN SOD (PORK) LOCK FLUSH 100 UNIT/ML IV SOLN
500.0000 [IU] | Freq: Once | INTRAVENOUS | Status: AC | PRN
  Administered 2017-03-08: 500 [IU] via INTRAVENOUS
  Filled 2017-03-08: qty 5

## 2017-03-08 NOTE — Telephone Encounter (Signed)
Printed avs and calender for patient with upcoming appointments for November. Per 10/3

## 2017-03-09 ENCOUNTER — Encounter: Payer: Self-pay | Admitting: Hematology and Oncology

## 2017-03-09 ENCOUNTER — Other Ambulatory Visit: Payer: Self-pay | Admitting: Hematology and Oncology

## 2017-03-09 DIAGNOSIS — Z923 Personal history of irradiation: Secondary | ICD-10-CM | POA: Diagnosis not present

## 2017-03-09 DIAGNOSIS — Z853 Personal history of malignant neoplasm of breast: Secondary | ICD-10-CM | POA: Diagnosis not present

## 2017-03-09 DIAGNOSIS — T8543XA Leakage of breast prosthesis and implant, initial encounter: Secondary | ICD-10-CM | POA: Diagnosis not present

## 2017-03-09 DIAGNOSIS — Z171 Estrogen receptor negative status [ER-]: Principal | ICD-10-CM

## 2017-03-09 DIAGNOSIS — Z9011 Acquired absence of right breast and nipple: Secondary | ICD-10-CM | POA: Diagnosis not present

## 2017-03-09 DIAGNOSIS — C50211 Malignant neoplasm of upper-inner quadrant of right female breast: Secondary | ICD-10-CM

## 2017-03-09 NOTE — Assessment & Plan Note (Signed)
02/29/2016: Right breast palpable mass (with silicone implants 9507), 3.5 cm on MRI, additional 3 cm anterior linear enhancement (biopsy 03/14/2016 IDC grade 3); 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  T2 N0 stage 2A clinical stage  Treatment summary: 1. Neoadjuvant chemotherapy with dose dense Adriamycin and Cytoxan 4 followed by Abraxane weekly 5(stopped early due to neuropathy) 2. 09/13/2016: Right lumpectomy: IDC grade 3, 2 foci, 2 cm and 1.1 cm, 0/3 lymph nodes negative margins negative, ER 0%, PR 0%, HER-2 negative ratio 1.02, Ki-67 60%, RCB-II; ypT2ypN0 Stage 2A (with plastic surgery removing the ruptured implant) 3. Followed by radiation therapy completed 12/06/2016 ----------------------------------------------------------------------------------------------------------------------------------------- Recommendation:  1. Adjuvant Xeloda 1000 mg by mouth twice a day 2 weeks on one week off started 12/30/2016  2. After 3-4 months of Xeloda, we will plan to randomize the patient on SWOG 1418 clinical trial Pembrolizumab versus observation  Insomnia issues: Patient will take Cymbalta and see if it helps with depression and sleep issues.  Xeloda toxicities: Denies any diarrhea or hand-foot syndrome. Has fatigue issues.  Return to clinic in 4 weeks with labs and follow-up and at that time we would like to randomize her on the clinical trial. Patient is completely agreeable and has gone through the consent forms.

## 2017-03-09 NOTE — Progress Notes (Signed)
Patient Care Team: Tammi Sou, MD as PCP - General (Family Medicine) Alphonsa Overall, MD as Consulting Physician (General Surgery) Nicholas Lose, MD as Consulting Physician (Hematology and Oncology) Kyung Rudd, MD as Consulting Physician (Radiation Oncology) Irene Limbo, MD as Consulting Physician (Plastic Surgery) Gerome Apley, MD as Consulting Physician (Endocrinology) Delice Bison Charlestine Massed, NP as Nurse Practitioner (Hematology and Oncology)  DIAGNOSIS:  Encounter Diagnosis  Name Primary?  . Malignant neoplasm of upper-inner quadrant of right breast in female, estrogen receptor negative ([place]) Yes    SUMMARY OF ONCOLOGIC HISTORY:   Breast cancer of upper-inner quadrant of right female breast ([place])   02/29/2016 Initial Diagnosis    Right breast palpable mass (with silicone implants 1448), 3.5 cm on MRI, additional 3 cm anterior linear enhancement? 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 Ki-67 60%, T2 N0 stage 2A clinical stage      03/14/2016 Procedure    Right breast biopsy upper inner quadrant: IDC grade 3      03/28/2016 -  Neo-Adjuvant Chemotherapy    Neoadjuvant chemotherapy with dose dense Adriamycin and Cytoxan followed by Abraxane weekly 5 ( patient is diabetic and cannot take steroids)      07/15/2016 Breast MRI    Right breast: Spiculated mass 1.5 cm significantly smaller compared to prior,NME previously seen is not noted, no abnormal lymph nodes      09/13/2016 Surgery    Removal of the silicone implant due to intracapsular rupture and capsulectomy (Dr.Thimappa)      09/13/2016 Surgery    Right lumpectomy: IDC grade 3, 2 foci, 2 cm and 1.1 cm, 0/3 lymph nodes negative margins negative, ER 0%, PR 0%, HER-2 negative ratio 1.02, Ki-67 60%, RCB-II; ypT2ypN0 Stage 2A       10/20/2016 - 12/06/2016 Radiation Therapy    Adjuvant radiation therapy      12/30/2016 -  Chemotherapy    Xeloda 1000 mg by mouth twice a day adjuvant therapy         CHIEF COMPLIANT: Follow-up on Xeloda  INTERVAL HISTORY: Marissa Hall is a 76 year old triple negative breast cancer was currently on adjuvant therapy with Xeloda. She's been on her third cycle of Xeloda and appears to be tolerating it fairly well. She does have fatigue which is now chronic but otherwise does not have any diarrhea or hand-foot syndrome.  REVIEW OF SYSTEMS:   Constitutional: Fatigue Eyes: Denies blurriness of vision Ears, nose, mouth, throat, and face: Denies mucositis or sore throat Respiratory: Denies cough, dyspnea or wheezes Cardiovascular: Denies palpitation, chest discomfort Gastrointestinal:  Denies nausea, heartburn or change in bowel habits Skin: Denies abnormal skin rashes Lymphatics: Denies new lymphadenopathy or easy bruising Neurological:Denies numbness, tingling or new weaknesses Behavioral/Psych: Mood is stable, no new changes  Extremities: No lower extremity edema  All other systems were reviewed with the patient and are negative.  I have reviewed the past medical history, past surgical history, social history and family history with the patient and they are unchanged from previous note.  ALLERGIES:  is allergic to other and penicillins.  MEDICATIONS:  Current Outpatient Prescriptions  Medication Sig Dispense Refill  . atorvastatin (LIPITOR) 80 MG tablet Take 80 mg by mouth every evening.     [name] Kitchen azelastine (ASTELIN) 0.1 % nasal spray Place 2 sprays into both nostrils daily as needed for rhinitis or allergies. Use in each nostril as directed    . DULoxetine (CYMBALTA) 30 MG capsule Take 1 capsule (30 mg total) by mouth daily.  90 capsule 1  . hyaluronate sodium (RADIAPLEXRX) GEL Apply 1 application topically 2 (two) times daily.    [name] Kitchen levothyroxine (SYNTHROID, LEVOTHROID) 112 MCG tablet Take 1 tablet by mouth daily.    [name] Kitchen lisinopril (PRINIVIL,ZESTRIL) 20 MG tablet Take 20 mg by mouth every evening.     . magnesium oxide (MAG-OX) 400 (241.3 Mg) MG  tablet Take 1.5 tablets (600 mg total) by mouth daily. 30 tablet 3  . metFORMIN (GLUCOPHAGE-XR) 500 MG 24 hr tablet Take 1,000 mg by mouth daily with supper.     [name] Kitchen omeprazole (PRILOSEC) 40 MG capsule Take 1 capsule (40 mg total) by mouth daily. 30 capsule 2  . pyridoxine (B-6) 100 MG tablet Take 100 mg by mouth.    . traZODone (DESYREL) 50 MG tablet 1-3 tabs po qhs prn anxiety 60 tablet 3  . Vitamin D, Ergocalciferol, (DRISDOL) 50000 units CAPS capsule Take 50,000 Units by mouth every 7 (seven) days. Saturdays    . XELODA 500 MG tablet TAKE 2 TABLETS BY MOUTH 2 TIMES DAILY AFTER A MEAL. TAKE FOR 14 DAYS ON, 7 DAYS OFF 56 tablet 0   No current facility-administered medications for this visit.     PHYSICAL EXAMINATION: ECOG PERFORMANCE STATUS: 1 - Symptomatic but completely ambulatory  Vitals:   03/08/17 1153  BP: (!) 182/71  Pulse: 77  Resp: 18  Temp: 98.3 F (36.8 C)  SpO2: 97%   Filed Weights   03/08/17 1153  Weight: 175 lb 6.4 oz (79.6 kg)    GENERAL:alert, no distress and comfortable SKIN: skin color, texture, turgor are normal, no rashes or significant lesions EYES: normal, Conjunctiva are pink and non-injected, sclera clear OROPHARYNX:no exudate, no erythema and lips, buccal mucosa, and tongue normal  NECK: supple, thyroid normal size, non-tender, without nodularity LYMPH:  no palpable lymphadenopathy in the cervical, axillary or inguinal LUNGS: clear to auscultation and percussion with normal breathing effort HEART: regular rate & rhythm and no murmurs and no lower extremity edema ABDOMEN:abdomen soft, non-tender and normal bowel sounds MUSCULOSKELETAL:no cyanosis of digits and no clubbing  NEURO: alert & oriented x 3 with fluent speech, no focal motor/sensory deficits EXTREMITIES: No lower extremity edema  LABORATORY DATA:  I have reviewed the data as listed   Chemistry      Component Value Date/Time   NA 140 03/08/2017 1131   K 4.0 03/08/2017 1131   CL 102  09/07/2016 1320   CO2 25 03/08/2017 1131   BUN 13.7 03/08/2017 1131   CREATININE 0.8 03/08/2017 1131      Component Value Date/Time   CALCIUM 9.5 03/08/2017 1131   ALKPHOS 88 03/08/2017 1131   AST 69 (H) 03/08/2017 1131   ALT 56 (H) 03/08/2017 1131   BILITOT 1.33 (H) 03/08/2017 1131       Lab Results  Component Value Date   WBC 3.3 (L) 03/08/2017   HGB 13.1 03/08/2017   HCT 38.6 03/08/2017   MCV 104.0 (H) 03/08/2017   PLT 135 (L) 03/08/2017   NEUTROABS 2.0 03/08/2017    ASSESSMENT & PLAN:  Breast cancer of upper-inner quadrant of right female breast ([place]) 02/29/2016: Right breast palpable mass (with silicone implants 1941), 3.5 cm on MRI, additional 3 cm anterior linear enhancement (biopsy 03/14/2016 IDC grade 3); 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  T2 N0 stage 2A clinical stage  Treatment summary: 1. Neoadjuvant chemotherapy with dose dense Adriamycin and Cytoxan 4 followed by Abraxane weekly 5(stopped early due to neuropathy) 2. 09/13/2016: Right  lumpectomy: IDC grade 3, 2 foci, 2 cm and 1.1 cm, 0/3 lymph nodes negative margins negative, ER 0%, PR 0%, HER-2 negative ratio 1.02, Ki-67 60%, RCB-II; ypT2ypN0 Stage 2A (with plastic surgery removing the ruptured implant) 3. Followed by radiation therapy completed 12/06/2016 ----------------------------------------------------------------------------------------------------------------------------------------- Recommendation:  1. Adjuvant Xeloda 1000 mg by mouth twice a day 2 weeks on one week off started 12/30/2016  2. next month she will complete 4 cycles of Xeloda, at that time we will plan to randomize the patient on SWOG 1418 clinical trial Pembrolizumab versus observation  Insomnia issues: Patient will take Cymbalta and see if it helps with depression and sleep issues.  Xeloda toxicities: Denies any diarrhea or hand-foot syndrome. Has fatigue issues.  Return to clinic in 4 weeks with labs and follow-up  and at that time we would like to randomize her on the clinical trial. Patient is completely agreeable and has gone through the consent forms.   I spent 25 minutes talking to the patient of which more than half was spent in counseling and coordination of care.  Orders Placed This Encounter  Procedures  . MM DIAG BREAST TOMO BILATERAL    Standing Status:   Future    Standing Expiration Date:   03/08/2018    Order Specific Question:   Reason for Exam (SYMPTOM  OR DIAGNOSIS REQUIRED)    Answer:   Annual mammogram    Order Specific Question:   Preferred imaging location?    Answer:   External    Comments:   [facility]  . CBC with Differential    Standing Status:   Future    Standing Expiration Date:   03/08/2018  . Comprehensive metabolic panel    Standing Status:   Future    Standing Expiration Date:   03/08/2018   The patient has a good understanding of the overall plan. she agrees with it. she will call with any problems that may develop before the next visit here.   Rulon Eisenmenger, MD 03/09/17

## 2017-03-16 ENCOUNTER — Other Ambulatory Visit: Payer: Self-pay | Admitting: *Deleted

## 2017-03-17 MED FILL — XELODA 500 MG TABLET: 500 | 21 days supply | Qty: 56 | Fill #0

## 2017-03-21 ENCOUNTER — Encounter: Payer: Self-pay | Admitting: Family Medicine

## 2017-03-21 DIAGNOSIS — Z853 Personal history of malignant neoplasm of breast: Secondary | ICD-10-CM | POA: Diagnosis not present

## 2017-04-10 ENCOUNTER — Other Ambulatory Visit: Payer: Medicare Other

## 2017-04-10 ENCOUNTER — Ambulatory Visit: Payer: Medicare Other | Admitting: Hematology and Oncology

## 2017-04-10 ENCOUNTER — Encounter: Payer: Self-pay | Admitting: *Deleted

## 2017-04-13 DIAGNOSIS — E039 Hypothyroidism, unspecified: Secondary | ICD-10-CM | POA: Diagnosis not present

## 2017-04-17 ENCOUNTER — Encounter: Payer: Medicare Other | Admitting: *Deleted

## 2017-04-17 ENCOUNTER — Encounter: Payer: Self-pay | Admitting: *Deleted

## 2017-04-17 ENCOUNTER — Ambulatory Visit (HOSPITAL_BASED_OUTPATIENT_CLINIC_OR_DEPARTMENT_OTHER): Payer: Medicare Other | Admitting: Hematology and Oncology

## 2017-04-17 ENCOUNTER — Other Ambulatory Visit (HOSPITAL_BASED_OUTPATIENT_CLINIC_OR_DEPARTMENT_OTHER): Payer: Medicare Other

## 2017-04-17 ENCOUNTER — Telehealth: Payer: Self-pay | Admitting: Hematology and Oncology

## 2017-04-17 DIAGNOSIS — C50211 Malignant neoplasm of upper-inner quadrant of right female breast: Secondary | ICD-10-CM

## 2017-04-17 DIAGNOSIS — Z171 Estrogen receptor negative status [ER-]: Secondary | ICD-10-CM | POA: Diagnosis not present

## 2017-04-17 DIAGNOSIS — Z923 Personal history of irradiation: Secondary | ICD-10-CM | POA: Diagnosis not present

## 2017-04-17 LAB — CBC WITH DIFFERENTIAL/PLATELET
BASO%: 0.6 % (ref 0.0–2.0)
Basophils Absolute: 0 10*3/uL (ref 0.0–0.1)
EOS%: 2.3 % (ref 0.0–7.0)
Eosinophils Absolute: 0.1 10*3/uL (ref 0.0–0.5)
HCT: 38.8 % (ref 34.8–46.6)
HGB: 13.1 g/dL (ref 11.6–15.9)
LYMPH%: 29.3 % (ref 14.0–49.7)
MCH: 36 pg — ABNORMAL HIGH (ref 25.1–34.0)
MCHC: 33.8 g/dL (ref 31.5–36.0)
MCV: 106.6 fL — ABNORMAL HIGH (ref 79.5–101.0)
MONO#: 0.3 10*3/uL (ref 0.1–0.9)
MONO%: 8 % (ref 0.0–14.0)
NEUT%: 59.8 % (ref 38.4–76.8)
NEUTROS ABS: 2.1 10*3/uL (ref 1.5–6.5)
PLATELETS: 142 10*3/uL — AB (ref 145–400)
RBC: 3.64 10*6/uL — AB (ref 3.70–5.45)
RDW: 14.7 % — AB (ref 11.2–14.5)
WBC: 3.5 10*3/uL — AB (ref 3.9–10.3)
lymph#: 1 10*3/uL (ref 0.9–3.3)

## 2017-04-17 LAB — COMPREHENSIVE METABOLIC PANEL
ALBUMIN: 4 g/dL (ref 3.5–5.0)
ALK PHOS: 75 U/L (ref 40–150)
ALT: 45 U/L (ref 0–55)
ANION GAP: 8 meq/L (ref 3–11)
AST: 49 U/L — AB (ref 5–34)
BUN: 12 mg/dL (ref 7.0–26.0)
CALCIUM: 9.4 mg/dL (ref 8.4–10.4)
CO2: 26 mEq/L (ref 22–29)
Chloride: 105 mEq/L (ref 98–109)
Creatinine: 0.8 mg/dL (ref 0.6–1.1)
EGFR: >60 mL/min/{1.73_m2} (ref >60)
Glucose: 136 mg/dl (ref 70–140)
POTASSIUM: 4.3 meq/L (ref 3.5–5.1)
Sodium: 139 mEq/L (ref 136–145)
Total Bilirubin: 1.36 mg/dL — ABNORMAL HIGH (ref 0.20–1.20)
Total Protein: 7.1 g/dL (ref 6.4–8.3)

## 2017-04-17 NOTE — Progress Notes (Signed)
[place] NOTE  Patient Care Team: Tammi Sou, MD as PCP - General (Family Medicine) Alphonsa Overall, MD as Consulting Physician (General Surgery) Nicholas Lose, MD as Consulting Physician (Hematology and Oncology) Kyung Rudd, MD as Consulting Physician (Radiation Oncology) Irene Limbo, MD as Consulting Physician (Plastic Surgery) Gerome Apley, MD as Consulting Physician (Endocrinology) Delice Bison, Charlestine Massed, NP as Nurse Practitioner (Hematology and Oncology)  CHIEF COMPLAINTS/PURPOSE OF CONSULTATION:  Follow-up to discuss and sign consent form for SWOG 1418 clinical trial  HISTORY OF PRESENTING ILLNESS:  Marissa Hall 76 y.o. female is here to discuss starting clinical trial on SWOG S1418.  She has completed 4 cycles of Xeloda.  Her only long-term side effect is mild fatigue grade 1.  She is still working full-time.  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  She is here today to discuss signing consent form for adjuvant clinical trial of SWOG S 1418.  No further problems with diarrhea.  I reviewed her records extensively and collaborated the history with the patient.  SUMMARY OF ONCOLOGIC HISTORY:   Breast cancer of upper-inner quadrant of right female breast ([place])   02/29/2016 Initial Diagnosis    Right breast palpable mass (with silicone implants 4268), 3.5 cm on MRI, additional 3 cm anterior linear enhancement? 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 Ki-67 60%, T2 N0 stage 2A clinical stage      03/14/2016 Procedure    Right breast biopsy upper inner quadrant: IDC grade 3      03/28/2016 -  Neo-Adjuvant Chemotherapy    Neoadjuvant chemotherapy with dose dense Adriamycin and Cytoxan followed by Abraxane weekly 5 ( patient is diabetic and cannot take steroids)      07/15/2016 Breast MRI    Right breast: Spiculated mass 1.5 cm significantly smaller  compared to prior,NME previously seen is not noted, no abnormal lymph nodes      09/13/2016 Surgery    Removal of the silicone implant due to intracapsular rupture and capsulectomy (Dr.Thimappa)      09/13/2016 Surgery    Right lumpectomy: IDC grade 3, 2 foci, 2 cm and 1.1 cm, 0/3 lymph nodes negative margins negative, ER 0%, PR 0%, HER-2 negative ratio 1.02, Ki-67 60%, RCB-II; ypT2ypN0 Stage 2A       10/20/2016 - 12/06/2016 Radiation Therapy    Adjuvant radiation therapy      12/30/2016 - 04/05/2017 Chemotherapy    Xeloda 1000 mg by mouth twice a day adjuvant therapy x 4 cycles       MEDICAL HISTORY:  Past Medical History:  Diagnosis Date  . Anxiety and depression    oncologist started duloxetine 12/2016  . Breast cancer ([place])    Clinical stage 2A: (triple neg): Right breast, upper inner quadrant, 03/2016.  Neoadjuvant chemo x 5 cycles, then lumpectomy 4 mo later, then radiation started 10/2016.  Adjuvant Xeloda started 01/2017.  [name] Kitchen Chemotherapy-induced neuropathy ([place])    feet; responding well to cymbalta  . Depression   . Diabetes ([place])    with painful PN-managed by endo.  [name] Kitchen GERD (gastroesophageal reflux disease)   . Hypertension   . Hypothyroidism    managed by endo.  . Osteoporosis    pt states "osteopenia", but then says that she refused to take the rx med for this condition, so I suspect she had osteoporosis.  [name] Kitchen TIA (transient ischemic attack)     SURGICAL HISTORY: Past  Surgical History:  Procedure Laterality Date  . ABDOMINAL HYSTERECTOMY  1972  . APPENDECTOMY  1972  . BREAST ENHANCEMENT SURGERY  1982  . BREAST SURGERY     breast biopsy  . surgical repair left ankle  2009  . TONSILLECTOMY AND ADENOIDECTOMY     REMOTE past    SOCIAL HISTORY: Social History   Socioeconomic History  . Marital status: Legally Separated    Spouse name: Not on file  . Number of children: Not on file  . Years of education: Not on file  . Highest education level: Not on file   Social Needs  . Financial resource strain: Not on file  . Food insecurity - worry: Not on file  . Food insecurity - inability: Not on file  . Transportation needs - medical: Not on file  . Transportation needs - non-medical: Not on file  Occupational History  . Not on file  Tobacco Use  . Smoking status: Former Smoker    Packs/day: 1.00    Types: Cigarettes    Last attempt to quit: 03/08/1999    Years since quitting: 18.1  . Smokeless tobacco: Never Used  Substance and Sexual Activity  . Alcohol use: Yes    Comment: wine daily  . Drug use: No  . Sexual activity: Not on file  Other Topics Concern  . Not on file  Social History Narrative   Divorced, has a son who lives in [place].   Educ: college   Occup: Optometrist, still working part time.   Tob: quit 2000.     Alc: several times a week--1-2 glasses wine.   No drugs.    FAMILY HISTORY: Family History  Problem Relation Age of Onset  . Stroke Mother   . Suicidality Father   . Stroke Brother   . Stroke Son     ALLERGIES:  is allergic to other and penicillins.  MEDICATIONS:  Current Outpatient Medications  Medication Sig Dispense Refill  . atorvastatin (LIPITOR) 80 MG tablet Take 80 mg by mouth every evening.     [name] Kitchen azelastine (ASTELIN) 0.1 % nasal spray Place 2 sprays into both nostrils daily as needed for rhinitis or allergies. Use in each nostril as directed    . DULoxetine (CYMBALTA) 30 MG capsule Take 1 capsule (30 mg total) by mouth daily. 90 capsule 1  . hyaluronate sodium (RADIAPLEXRX) GEL Apply 1 application topically 2 (two) times daily.    [name] Kitchen levothyroxine (SYNTHROID, LEVOTHROID) 112 MCG tablet Take 1 tablet by mouth daily.    [name] Kitchen lisinopril (PRINIVIL,ZESTRIL) 20 MG tablet Take 20 mg by mouth every evening.     . magnesium oxide (MAG-OX) 400 (241.3 Mg) MG tablet Take 1.5 tablets (600 mg total) by mouth daily. 30 tablet 3  . metFORMIN (GLUCOPHAGE-XR) 500 MG 24 hr tablet Take 1,000 mg by mouth daily with supper.      [name] Kitchen omeprazole (PRILOSEC) 40 MG capsule Take 1 capsule (40 mg total) by mouth daily. 30 capsule 2  . pyridoxine (B-6) 100 MG tablet Take 100 mg by mouth.    . traZODone (DESYREL) 50 MG tablet 1-3 tabs po qhs prn anxiety 60 tablet 3  . Vitamin D, Ergocalciferol, (DRISDOL) 50000 units CAPS capsule Take 50,000 Units by mouth every 7 (seven) days. Saturdays    . XELODA 500 MG tablet TAKE 2 TABLETS BY MOUTH 2 TIMES DAILY AFTER A MEAL. TAKE FOR 14 DAYS ON, 7 DAYS OFF 56 tablet 0   No current facility-administered medications for this  visit.     REVIEW OF SYSTEMS:   Constitutional: Denies fevers, chills or abnormal night sweats Eyes: Denies blurriness of vision, double vision or watery eyes Ears, nose, mouth, throat, and face: Denies mucositis or sore throat Respiratory: Denies cough, dyspnea or wheezes Cardiovascular: Denies palpitation, chest discomfort or lower extremity swelling Gastrointestinal:  Denies nausea, heartburn or change in bowel habits Skin: Denies abnormal skin rashes Lymphatics: Denies new lymphadenopathy or easy bruising Neurological:Denies numbness, tingling or new weaknesses Behavioral/Psych: Mood is stable, no new changes  Breast:  Denies any palpable lumps or discharge All other systems were reviewed with the patient and are negative.  PHYSICAL EXAMINATION: ECOG PERFORMANCE STATUS: 1 - Symptomatic but completely ambulatory  Vitals:   04/17/17 1008  BP: (!) 158/60  Pulse: 62  Resp: 20  Temp: 98.4 F (36.9 C)  SpO2: 100%   Filed Weights   04/17/17 1008  Weight: 175 lb 3.2 oz (79.5 kg)    GENERAL:alert, no distress and comfortable SKIN: skin color, texture, turgor are normal, no rashes or significant lesions EYES: normal, conjunctiva are pink and non-injected, sclera clear OROPHARYNX:no exudate, no erythema and lips, buccal mucosa, and tongue normal  NECK: supple, thyroid normal size, non-tender, without nodularity LYMPH:  no palpable lymphadenopathy in the  cervical, axillary or inguinal LUNGS: clear to auscultation and percussion with normal breathing effort HEART: regular rate & rhythm and no murmurs and no lower extremity edema ABDOMEN:abdomen soft, non-tender and normal bowel sounds Musculoskeletal:no cyanosis of digits and no clubbing  PSYCH: alert & oriented x 3 with fluent speech NEURO: no focal motor/sensory deficits   LABORATORY DATA:  I have reviewed the data as listed Lab Results  Component Value Date   WBC 3.5 (L) 04/17/2017   HGB 13.1 04/17/2017   HCT 38.8 04/17/2017   MCV 106.6 (H) 04/17/2017   PLT 142 (L) 04/17/2017   Lab Results  Component Value Date   NA 140 03/08/2017   K 4.0 03/08/2017   CL 102 09/07/2016   CO2 25 03/08/2017    RADIOGRAPHIC STUDIES: I have personally reviewed the radiological reports and agreed with the findings in the report.  ASSESSMENT AND PLAN:  Breast cancer of upper-inner quadrant of right female breast ([place]) 02/29/2016: Right breast palpable mass (with silicone implants 5093), 3.5 cm on MRI, additional 3 cm anterior linear enhancement (biopsy 03/14/2016 IDC grade 3); 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  T2 N0 stage 2A clinical stage  Treatment summary: 1. Neoadjuvant chemotherapy with dose dense Adriamycin and Cytoxan 4 followed by Abraxane weekly 5(stopped early due to neuropathy) 2. 09/13/2016: Right lumpectomy: IDC grade 3, 2 foci, 2 cm and 1.1 cm, 0/3 lymph nodes negative margins negative, ER 0%, PR 0%, HER-2 negative ratio 1.02, Ki-67 60%, RCB-II; ypT2ypN0 Stage 2A (with plastic surgery removing the ruptured implant) 3. Radiation therapy completed 12/06/2016 4. Adjuvant Xeloda 1000 mg by mouth twice a day 2 weeks on one week off started 12/30/2016 completed 04/05/2017 ----------------------------------------------------------------------------------------------------------------------------------------- Recommendation:  SWOG 1418 clinical trial Pembrolizumab versus  observation Our clinical research nurse as well as myself spent extensive amount of time counseling the patient about immunotherapy risks and benefits.  She will sign consent form today. We will hope to start her on 05/03/2017 We spent almost 60 minutes going over the clinical trial protocol and answering her questions.  All questions were answered. The patient knows to call the clinic with any problems, questions or concerns.    Rulon Eisenmenger, MD 04/17/17

## 2017-04-17 NOTE — Assessment & Plan Note (Signed)
02/29/2016: Right breast palpable mass (with silicone implants 3779), 3.5 cm on MRI, additional 3 cm anterior linear enhancement (biopsy 03/14/2016 IDC grade 3); 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  T2 N0 stage 2A clinical stage  Treatment summary: 1. Neoadjuvant chemotherapy with dose dense Adriamycin and Cytoxan 4 followed by Abraxane weekly 5(stopped early due to neuropathy) 2. 09/13/2016: Right lumpectomy: IDC grade 3, 2 foci, 2 cm and 1.1 cm, 0/3 lymph nodes negative margins negative, ER 0%, PR 0%, HER-2 negative ratio 1.02, Ki-67 60%, RCB-II; ypT2ypN0 Stage 2A (with plastic surgery removing the ruptured implant) 3. Followed by radiation therapy completed 12/06/2016 ----------------------------------------------------------------------------------------------------------------------------------------- Recommendation:  1. Adjuvant Xeloda 1000 mg by mouth twice a day 2 weeks on one week off started 12/30/2016  2. Since she completed 4 cycles of Xeloda, we will plan to randomize the patient on SWOG 1418 clinical trial Pembrolizumab versus observation  Insomnia issues: Patient will take Cymbalta and see if it helps with depression and sleep issues.  Xeloda toxicities: Denies any diarrhea or hand-foot syndrome. Has mild fatigue.

## 2017-04-17 NOTE — Telephone Encounter (Signed)
Gave patient avs report and appointments for for November thru January.

## 2017-04-18 ENCOUNTER — Encounter: Payer: Self-pay | Admitting: *Deleted

## 2017-04-23 ENCOUNTER — Telehealth: Payer: Self-pay

## 2017-04-23 NOTE — Telephone Encounter (Signed)
Spoke with patient and she is aware of her 11/26 appts   Marissa Hall

## 2017-04-25 ENCOUNTER — Other Ambulatory Visit: Payer: Self-pay | Admitting: *Deleted

## 2017-04-25 DIAGNOSIS — Z006 Encounter for examination for normal comparison and control in clinical research program: Secondary | ICD-10-CM

## 2017-05-01 ENCOUNTER — Ambulatory Visit (HOSPITAL_BASED_OUTPATIENT_CLINIC_OR_DEPARTMENT_OTHER): Payer: Medicare Other

## 2017-05-01 ENCOUNTER — Other Ambulatory Visit (HOSPITAL_BASED_OUTPATIENT_CLINIC_OR_DEPARTMENT_OTHER): Payer: Medicare Other

## 2017-05-01 ENCOUNTER — Encounter: Payer: Medicare Other | Admitting: *Deleted

## 2017-05-01 VITALS — BP 125/63 | HR 53 | Temp 97.7°F

## 2017-05-01 DIAGNOSIS — Z171 Estrogen receptor negative status [ER-]: Principal | ICD-10-CM

## 2017-05-01 DIAGNOSIS — Z006 Encounter for examination for normal comparison and control in clinical research program: Secondary | ICD-10-CM

## 2017-05-01 DIAGNOSIS — Z95828 Presence of other vascular implants and grafts: Secondary | ICD-10-CM

## 2017-05-01 DIAGNOSIS — C50211 Malignant neoplasm of upper-inner quadrant of right female breast: Secondary | ICD-10-CM

## 2017-05-01 DIAGNOSIS — Z452 Encounter for adjustment and management of vascular access device: Secondary | ICD-10-CM | POA: Diagnosis present

## 2017-05-01 LAB — CBC WITH DIFFERENTIAL/PLATELET
BASO%: 0.6 % (ref 0.0–2.0)
BASOS ABS: 0 10*3/uL (ref 0.0–0.1)
EOS ABS: 0.1 10*3/uL (ref 0.0–0.5)
EOS%: 2.1 % (ref 0.0–7.0)
HCT: 38.7 % (ref 34.8–46.6)
HEMOGLOBIN: 13 g/dL (ref 11.6–15.9)
LYMPH%: 32.3 % (ref 14.0–49.7)
MCH: 35.6 pg — AB (ref 25.1–34.0)
MCHC: 33.6 g/dL (ref 31.5–36.0)
MCV: 106 fL — AB (ref 79.5–101.0)
MONO#: 0.3 10*3/uL (ref 0.1–0.9)
MONO%: 8.2 % (ref 0.0–14.0)
NEUT%: 56.8 % (ref 38.4–76.8)
NEUTROS ABS: 1.9 10*3/uL (ref 1.5–6.5)
Platelets: 134 10*3/uL — ABNORMAL LOW (ref 145–400)
RBC: 3.65 10*6/uL — AB (ref 3.70–5.45)
RDW: 13.7 % (ref 11.2–14.5)
WBC: 3.3 10*3/uL — ABNORMAL LOW (ref 3.9–10.3)
lymph#: 1.1 10*3/uL (ref 0.9–3.3)

## 2017-05-01 LAB — COMPREHENSIVE METABOLIC PANEL
ALT: 62 U/L — AB (ref 0–55)
AST: 51 U/L — AB (ref 5–34)
Albumin: 3.9 g/dL (ref 3.5–5.0)
Alkaline Phosphatase: 75 U/L (ref 40–150)
Anion Gap: 10 mEq/L (ref 3–11)
BUN: 14.9 mg/dL (ref 7.0–26.0)
CALCIUM: 9.6 mg/dL (ref 8.4–10.4)
CHLORIDE: 102 meq/L (ref 98–109)
CO2: 24 meq/L (ref 22–29)
CREATININE: 0.8 mg/dL (ref 0.6–1.1)
EGFR: >60 mL/min/{1.73_m2} (ref >60)
Glucose: 156 mg/dl — ABNORMAL HIGH (ref 70–140)
Potassium: 4.5 mEq/L (ref 3.5–5.1)
Sodium: 136 mEq/L (ref 136–145)
Total Bilirubin: 1.3 mg/dL — ABNORMAL HIGH (ref 0.20–1.20)
Total Protein: 6.9 g/dL (ref 6.4–8.3)

## 2017-05-01 LAB — RESEARCH LABS

## 2017-05-01 MED ORDER — HEPARIN SOD (PORK) LOCK FLUSH 100 UNIT/ML IV SOLN
500.0000 [IU] | Freq: Once | INTRAVENOUS | Status: AC | PRN
  Administered 2017-05-01: 500 [IU] via INTRAVENOUS
  Filled 2017-05-01: qty 5

## 2017-05-01 MED ORDER — SODIUM CHLORIDE 0.9% FLUSH
10.0000 mL | INTRAVENOUS | Status: DC | PRN
  Administered 2017-05-01: 10 mL via INTRAVENOUS
  Filled 2017-05-01: qty 10

## 2017-05-01 MED ORDER — LIDOCAINE-PRILOCAINE 2.5-2.5 % EX CREA: 1.0000 "application " | TOPICAL_CREAM | CUTANEOUS | 1 refills | Status: DC | PRN

## 2017-05-01 NOTE — Progress Notes (Signed)
Research Encounter; Baseline Visit for [phone]: See Consent Note Encounter opened on 04/10/17 for documentation of today's visit.  Foye Spurling, BSN, RN Clinical Research Nurse 05/01/2017 4:17 PM

## 2017-05-03 ENCOUNTER — Ambulatory Visit (HOSPITAL_BASED_OUTPATIENT_CLINIC_OR_DEPARTMENT_OTHER): Payer: Medicare Other | Admitting: Hematology and Oncology

## 2017-05-03 ENCOUNTER — Other Ambulatory Visit: Payer: Medicare Other

## 2017-05-03 ENCOUNTER — Other Ambulatory Visit: Payer: Self-pay | Admitting: Hematology and Oncology

## 2017-05-03 ENCOUNTER — Encounter: Payer: Self-pay | Admitting: *Deleted

## 2017-05-03 ENCOUNTER — Ambulatory Visit (HOSPITAL_BASED_OUTPATIENT_CLINIC_OR_DEPARTMENT_OTHER): Payer: Medicare Other

## 2017-05-03 DIAGNOSIS — C50211 Malignant neoplasm of upper-inner quadrant of right female breast: Secondary | ICD-10-CM

## 2017-05-03 DIAGNOSIS — Z171 Estrogen receptor negative status [ER-]: Principal | ICD-10-CM

## 2017-05-03 DIAGNOSIS — Z5112 Encounter for antineoplastic immunotherapy: Secondary | ICD-10-CM

## 2017-05-03 DIAGNOSIS — Z923 Personal history of irradiation: Secondary | ICD-10-CM | POA: Diagnosis not present

## 2017-05-03 DIAGNOSIS — Z9221 Personal history of antineoplastic chemotherapy: Secondary | ICD-10-CM | POA: Diagnosis not present

## 2017-05-03 DIAGNOSIS — Z006 Encounter for examination for normal comparison and control in clinical research program: Secondary | ICD-10-CM | POA: Diagnosis not present

## 2017-05-03 DIAGNOSIS — G4700 Insomnia, unspecified: Secondary | ICD-10-CM

## 2017-05-03 DIAGNOSIS — Z23 Encounter for immunization: Secondary | ICD-10-CM

## 2017-05-03 MED ORDER — INV-PEMBROLIZUMAB CHEMO INJECTION 100 MG/4ML SWOG S1418
200.0000 mg | Freq: Once | INTRAVENOUS | Status: AC
  Administered 2017-05-03: 200 mg via INTRAVENOUS
  Filled 2017-05-03: qty 8

## 2017-05-03 MED ORDER — SODIUM CHLORIDE 0.9 % IV SOLN
Freq: Once | INTRAVENOUS | Status: AC
  Administered 2017-05-03: 12:00:00 via INTRAVENOUS

## 2017-05-03 MED ORDER — SODIUM CHLORIDE 0.9% FLUSH
10.0000 mL | INTRAVENOUS | Status: DC | PRN
  Administered 2017-05-03: 10 mL
  Filled 2017-05-03: qty 10

## 2017-05-03 MED ORDER — HEPARIN SOD (PORK) LOCK FLUSH 100 UNIT/ML IV SOLN
500.0000 [IU] | Freq: Once | INTRAVENOUS | Status: AC | PRN
  Administered 2017-05-03: 500 [IU]
  Filled 2017-05-03: qty 5

## 2017-05-03 MED ORDER — INFLUENZA VAC SPLIT HIGH-DOSE 0.5 ML IM SUSY
0.5000 mL | PREFILLED_SYRINGE | Freq: Once | INTRAMUSCULAR | Status: AC
  Administered 2017-05-03: 0.5 mL via INTRAMUSCULAR
  Filled 2017-05-03: qty 0.5

## 2017-05-03 NOTE — Patient Instructions (Signed)
[facility] Discharge Instructions for Patients Receiving Chemotherapy  Today you received the following chemotherapy agents Keytruda  To help prevent nausea and vomiting after your treatment, we encourage you to take your nausea medication as directed  If you develop nausea and vomiting that is not controlled by your nausea medication, call the clinic.   BELOW ARE SYMPTOMS THAT SHOULD BE REPORTED IMMEDIATELY:  *FEVER GREATER THAN 100.5 F  *CHILLS WITH OR WITHOUT FEVER  NAUSEA AND VOMITING THAT IS NOT CONTROLLED WITH YOUR NAUSEA MEDICATION  *UNUSUAL SHORTNESS OF BREATH  *UNUSUAL BRUISING OR BLEEDING  TENDERNESS IN MOUTH AND THROAT WITH OR WITHOUT PRESENCE OF ULCERS  *URINARY PROBLEMS  *BOWEL PROBLEMS  UNUSUAL RASH Items with * indicate a potential emergency and should be followed up as soon as possible.  Feel free to call the clinic should you have any questions or concerns. The clinic phone number is [phone].  Please show the CHEMO ALERT CARD at check-in to the Emergency Department and triage nurse.    Pembrolizumab injection What is this medicine? PEMBROLIZUMAB (pem broe liz ue mab) is a monoclonal antibody. It is used to treat melanoma, head and neck cancer, Hodgkin lymphoma, non-small cell lung cancer, urothelial cancer, stomach cancer, and cancers that have a certain genetic condition. This medicine may be used for other purposes; ask your health care provider or pharmacist if you have questions. COMMON BRAND NAME(S): Keytruda What should I tell my health care provider before I take this medicine? They need to know if you have any of these conditions: -diabetes -immune system problems -inflammatory bowel disease -liver disease -lung or breathing disease -lupus -organ transplant -an unusual or allergic reaction to pembrolizumab, other medicines, foods, dyes, or preservatives -pregnant or trying to get pregnant -breast-feeding How should I  use this medicine? This medicine is for infusion into a vein. It is given by a health care professional in a hospital or clinic setting. A special MedGuide will be given to you before each treatment. Be sure to read this information carefully each time. Talk to your pediatrician regarding the use of this medicine in children. While this drug may be prescribed for selected conditions, precautions do apply. Overdosage: If you think you have taken too much of this medicine contact a poison control center or emergency room at once. NOTE: This medicine is only for you. Do not share this medicine with others. What if I miss a dose? It is important not to miss your dose. Call your doctor or health care professional if you are unable to keep an appointment. What may interact with this medicine? Interactions have not been studied. Give your health care provider a list of all the medicines, herbs, non-prescription drugs, or dietary supplements you use. Also tell them if you smoke, drink alcohol, or use illegal drugs. Some items may interact with your medicine. This list may not describe all possible interactions. Give your health care provider a list of all the medicines, herbs, non-prescription drugs, or dietary supplements you use. Also tell them if you smoke, drink alcohol, or use illegal drugs. Some items may interact with your medicine. What should I watch for while using this medicine? Your condition will be monitored carefully while you are receiving this medicine. You may need blood work done while you are taking this medicine. Do not become pregnant while taking this medicine or for 4 months after stopping it. Women should inform their doctor if they wish to become pregnant or think they   might be pregnant. There is a potential for serious side effects to an unborn child. Talk to your health care professional or pharmacist for more information. Do not breast-feed an infant while taking this medicine or  for 4 months after the last dose. What side effects may I notice from receiving this medicine? Side effects that you should report to your doctor or health care professional as soon as possible: -allergic reactions like skin rash, itching or hives, swelling of the face, lips, or tongue -bloody or black, tarry -breathing problems -changes in vision -chest pain -chills -constipation -cough -dizziness or feeling faint or lightheaded -fast or irregular heartbeat -fever -flushing -hair loss -low blood counts - this medicine may decrease the number of white blood cells, red blood cells and platelets. You may be at increased risk for infections and bleeding. -muscle pain -muscle weakness -persistent headache -signs and symptoms of high blood sugar such as dizziness; dry mouth; dry skin; fruity breath; nausea; stomach pain; increased hunger or thirst; increased urination -signs and symptoms of kidney injury like trouble passing urine or change in the amount of urine -signs and symptoms of liver injury like dark urine, light-colored stools, loss of appetite, nausea, right upper belly pain, yellowing of the eyes or skin -stomach pain -sweating -weight loss Side effects that usually do not require medical attention (report to your doctor or health care professional if they continue or are bothersome): -decreased appetite -diarrhea -tiredness This list may not describe all possible side effects. Call your doctor for medical advice about side effects. You may report side effects to FDA at 1-800-FDA-1088. Where should I keep my medicine? This drug is given in a hospital or clinic and will not be stored at home. NOTE: This sheet is a summary. It may not cover all possible information. If you have questions about this medicine, talk to your doctor, pharmacist, or health care provider.  2018 Elsevier/Gold Standard (2016-03-01 12:29:36)    

## 2017-05-03 NOTE — Progress Notes (Signed)
Patient Care Team: Tammi Sou, MD as PCP - General (Family Medicine) Alphonsa Overall, MD as Consulting Physician (General Surgery) Nicholas Lose, MD as Consulting Physician (Hematology and Oncology) Kyung Rudd, MD as Consulting Physician (Radiation Oncology) Irene Limbo, MD as Consulting Physician (Plastic Surgery) Gerome Apley, MD as Consulting Physician (Endocrinology) Delice Bison Charlestine Massed, NP as Nurse Practitioner (Hematology and Oncology)  DIAGNOSIS:  Encounter Diagnosis  Name Primary?  . Malignant neoplasm of upper-inner quadrant of right breast in female, estrogen receptor negative ([place])     SUMMARY OF ONCOLOGIC HISTORY:   Breast cancer of upper-inner quadrant of right female breast ([place])   02/29/2016 Initial Diagnosis    Right breast palpable mass (with silicone implants 4536), 3.5 cm on MRI, additional 3 cm anterior linear enhancement? 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 Ki-67 60%, T2 N0 stage 2A clinical stage      03/14/2016 Procedure    Right breast biopsy upper inner quadrant: IDC grade 3      03/28/2016 -  Neo-Adjuvant Chemotherapy    Neoadjuvant chemotherapy with dose dense Adriamycin and Cytoxan followed by Abraxane weekly 5 ( patient is diabetic and cannot take steroids)      07/15/2016 Breast MRI    Right breast: Spiculated mass 1.5 cm significantly smaller compared to prior,NME previously seen is not noted, no abnormal lymph nodes      09/13/2016 Surgery    Removal of the silicone implant due to intracapsular rupture and capsulectomy (Dr.Thimappa)      09/13/2016 Surgery    Right lumpectomy: IDC grade 3, 2 foci, 2 cm and 1.1 cm, 0/3 lymph nodes negative margins negative, ER 0%, PR 0%, HER-2 negative ratio 1.02, Ki-67 60%, RCB-II; ypT2ypN0 Stage 2A       10/20/2016 - 12/06/2016 Radiation Therapy    Adjuvant radiation therapy      12/30/2016 - 04/05/2017 Chemotherapy    Xeloda 1000 mg by mouth twice a day adjuvant therapy x 4  cycles       05/03/2017 -  Chemotherapy    SWOG S 1418 Pembrolizumab on clinical trial        CHIEF COMPLIANT: SWOG I6803 cycle 1 pembrolizumab  INTERVAL HISTORY: Marissa Hall is a 76 year old with above-mentioned history of triple negative breast cancer who is currently enrolled on SWOG S 1418 clinical trial with pembrolizumab and today is cycle 1 of her treatment.  Most of the chemo toxicities from before have normalized except for mild grade 1 neuropathy.  She is also gotten over the fatigue related to Xeloda.  REVIEW OF SYSTEMS:   Constitutional: Denies fevers, chills or abnormal weight loss Eyes: Denies blurriness of vision Ears, nose, mouth, throat, and face: Denies mucositis or sore throat Respiratory: Denies cough, dyspnea or wheezes Cardiovascular: Denies palpitation, chest discomfort Gastrointestinal:  Denies nausea, heartburn or change in bowel habits Skin: Denies abnormal skin rashes Lymphatics: Denies new lymphadenopathy or easy bruising Neurological:Denies numbness, tingling or new weaknesses Behavioral/Psych: Mood is stable, no new changes  Extremities: No lower extremity edema  All other systems were reviewed with the patient and are negative.  I have reviewed the past medical history, past surgical history, social history and family history with the patient and they are unchanged from previous note.  ALLERGIES:  is allergic to other and penicillins.  MEDICATIONS:  Current Outpatient Medications  Medication Sig Dispense Refill  . atorvastatin (LIPITOR) 80 MG tablet Take 80 mg by mouth every evening.     [name] Kitchen azelastine (ASTELIN)  0.1 % nasal spray Place 2 sprays into both nostrils daily as needed for rhinitis or allergies. Use in each nostril as directed    . DULoxetine (CYMBALTA) 30 MG capsule Take 1 capsule (30 mg total) by mouth daily. 90 capsule 1  . levothyroxine (SYNTHROID, LEVOTHROID) 112 MCG tablet Take 1 tablet by mouth daily.    [name] Kitchen  lidocaine-prilocaine (EMLA) cream Apply 1 application topically as needed. 30 g 1  . lisinopril (PRINIVIL,ZESTRIL) 20 MG tablet Take 20 mg by mouth every evening.     . magnesium oxide (MAG-OX) 400 (241.3 Mg) MG tablet Take 1.5 tablets (600 mg total) by mouth daily. (Patient taking differently: Take 600 mg as needed by mouth (Leg Cramps). ) 30 tablet 3  . metFORMIN (GLUCOPHAGE-XR) 500 MG 24 hr tablet Take 1,000 mg by mouth daily with supper.     [name] Kitchen omeprazole (PRILOSEC) 40 MG capsule Take 1 capsule (40 mg total) by mouth daily. (Patient taking differently: Take 40 mg as needed by mouth (Heart Burn). ) 30 capsule 2  . pyridoxine (B-6) 100 MG tablet Take 100 mg by mouth.    . traZODone (DESYREL) 50 MG tablet 1-3 tabs po qhs prn anxiety (Patient taking differently: Take 25 mg at bedtime as needed by mouth for sleep (1/2 tablet as needed for sleep). 1-3 tabs po qhs prn anxiety) 60 tablet 3  . Vitamin D, Ergocalciferol, (DRISDOL) 50000 units CAPS capsule Take 50,000 Units by mouth every 7 (seven) days. Saturdays     No current facility-administered medications for this visit.    Facility-Administered Medications Ordered in Other Visits  Medication Dose Route Frequency Provider Last Rate Last Dose  . 0.9 %  sodium chloride infusion   Intravenous Once Nicholas Lose, MD      . heparin lock flush 100 unit/mL  500 Units Intracatheter Once PRN Nicholas Lose, MD      . INV-pembrolizumab SWOG [phone] ([id number]) 200 mg in sodium chloride 0.9 % 50 mL chemo infusion  200 mg Intravenous Once Nicholas Lose, MD      . sodium chloride flush (NS) 0.9 % injection 10 mL  10 mL Intracatheter PRN Nicholas Lose, MD        PHYSICAL EXAMINATION: ECOG PERFORMANCE STATUS: 0 - Asymptomatic  Vitals:   05/03/17 1041  BP: (!) 136/55  Pulse: 71  Resp: 17  Temp: 97.7 F (36.5 C)  SpO2: 98%   Filed Weights   05/03/17 1041  Weight: 177 lb 3.2 oz (80.4 kg)    GENERAL:alert, no distress and comfortable SKIN: skin color,  texture, turgor are normal, no rashes or significant lesions EYES: normal, Conjunctiva are pink and non-injected, sclera clear OROPHARYNX:no exudate, no erythema and lips, buccal mucosa, and tongue normal  NECK: supple, thyroid normal size, non-tender, without nodularity LYMPH:  no palpable lymphadenopathy in the cervical, axillary or inguinal LUNGS: clear to auscultation and percussion with normal breathing effort HEART: regular rate & rhythm and no murmurs and no lower extremity edema ABDOMEN:abdomen soft, non-tender and normal bowel sounds MUSCULOSKELETAL:no cyanosis of digits and no clubbing  NEURO: alert & oriented x 3 with fluent speech, no focal motor/sensory deficits EXTREMITIES: No lower extremity edema  LABORATORY DATA:  I have reviewed the data as listed   Chemistry      Component Value Date/Time   NA 136 05/01/2017 0914   K 4.5 05/01/2017 0914   CL 102 09/07/2016 1320   CO2 24 05/01/2017 0914   BUN 14.9 05/01/2017 0914   CREATININE  0.8 05/01/2017 0914      Component Value Date/Time   CALCIUM 9.6 05/01/2017 0914   ALKPHOS 75 05/01/2017 0914   AST 51 (H) 05/01/2017 0914   ALT 62 (H) 05/01/2017 0914   BILITOT 1.30 (H) 05/01/2017 0914       Lab Results  Component Value Date   WBC 3.3 (L) 05/01/2017   HGB 13.0 05/01/2017   HCT 38.7 05/01/2017   MCV 106.0 (H) 05/01/2017   PLT 134 (L) 05/01/2017   NEUTROABS 1.9 05/01/2017    ASSESSMENT & PLAN:  Breast cancer of upper-inner quadrant of right female breast ([place]) 02/29/2016: Right breast palpable mass (with silicone implants 4166), 3.5 cm on MRI, additional 3 cm anterior linear enhancement (biopsy 03/14/2016 IDC grade 3); 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  T2 N0 stage 2A clinical stage  Treatment summary: 1. Neoadjuvant chemotherapy with dose dense Adriamycin and Cytoxan 4 followed by Abraxane weekly 5(stopped early due to neuropathy) 2. 09/13/2016: Right lumpectomy: IDC grade 3, 2 foci, 2 cm and 1.1  cm, 0/3 lymph nodes negative margins negative, ER 0%, PR 0%, HER-2 negative ratio 1.02, Ki-67 60%, RCB-II; ypT2ypN0 Stage 2A (with plastic surgery removing the ruptured implant) 3. Followed by radiation therapy completed 12/06/2016 4. Adjuvant Xeloda 1000 mg by mouth twice a day 2 weeks on one week off 12/30/2016- Oct 2018 ----------------------------------------------------------------------------------------------------------------------------------------- Current treatment:  SWOG 1418 clinical trial Pembrolizumab versus observation; patient is randomized to Pembrolizumab  Insomnia issues: Patient is taking Cymbalta. Patient was counseled extensively about immunotherapy side effects and she has complete understanding and will proceed to receive pembrolizumab immunotherapy.  I spent 25 minutes talking to the patient of which more than half was spent in counseling and coordination of care.  No orders of the defined types were placed in this encounter.  The patient has a good understanding of the overall plan. she agrees with it. she will call with any problems that may develop before the next visit here.   Rulon Eisenmenger, MD 05/03/17

## 2017-05-03 NOTE — Assessment & Plan Note (Signed)
02/29/2016: Right breast palpable mass (with silicone implants 2320), 3.5 cm on MRI, additional 3 cm anterior linear enhancement (biopsy 03/14/2016 IDC grade 3); 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  T2 N0 stage 2A clinical stage  Treatment summary: 1. Neoadjuvant chemotherapy with dose dense Adriamycin and Cytoxan 4 followed by Abraxane weekly 5(stopped early due to neuropathy) 2. 09/13/2016: Right lumpectomy: IDC grade 3, 2 foci, 2 cm and 1.1 cm, 0/3 lymph nodes negative margins negative, ER 0%, PR 0%, HER-2 negative ratio 1.02, Ki-67 60%, RCB-II; ypT2ypN0 Stage 2A (with plastic surgery removing the ruptured implant) 3. Followed by radiation therapy completed 12/06/2016 4. Adjuvant Xeloda 1000 mg by mouth twice a day 2 weeks on one week off 12/30/2016-  ----------------------------------------------------------------------------------------------------------------------------------------- Current treatment:  SWOG 1418 clinical trial Pembrolizumab versus observation; patient is randomized to Pembrolizumab  Insomnia issues: Patient will take Cymbalta and see if it helps with depression and sleep issues.

## 2017-05-03 NOTE — Progress Notes (Signed)
Cycle 1, Day 1 visit for S1418, Arm 2;  Patient into clinic today to begin Cycle 1 of Pembrolizumab.  Patient's vital signs along with height and weight obtained.  Patient removed shoes for her weight.  I accompanied patient on her visit with Dr. Lindi Adie.  He completed H & P visit prior to treatment.  Her labs were completed on 05/01/17 and he reviewed them with patient today.  Patient denies any new problems or concerns since her last visit on 04/17/17.  He signed treatment orders, states ok to proceed with treatment as scheduled today.  Patient requests flu shot today and Dr. Lindi Adie ordered ok to be given today.   Reviewed upcoming schedule for cycle 1 and 2 with patient.  She is scheduled through the end of cycle 2.  Instructed patient to call office if she has any problems or questions between appointments.  Accompanied patient to infusion room.  Spoke with Rodney Langton, RN and gave him written instructions on infusing Pembrolizumab per protocol. He understands to infuse over 30 minutes using inline filter which is standard procedure in our clinic for this drug.  Thanked patient for her participation today and plan to see her again for Cycle 1, day 22 on 05/24/17.  She verbalized understanding.  Foye Spurling, BSN, RN Clinical Research Nurse 05/03/2017 11:30 AM

## 2017-05-04 ENCOUNTER — Encounter: Payer: Self-pay | Admitting: *Deleted

## 2017-05-06 ENCOUNTER — Encounter: Payer: Self-pay | Admitting: Family Medicine

## 2017-05-08 ENCOUNTER — Encounter: Payer: Self-pay | Admitting: Family Medicine

## 2017-05-11 ENCOUNTER — Telehealth: Payer: Self-pay | Admitting: *Deleted

## 2017-05-11 NOTE — Telephone Encounter (Signed)
Informed Dr. Lindi Adie and his collaborative nurse, Tiffany, of patient's report of white spots under her tongue.  Pt's dentist did send a form to Dr. Lindi Adie which indicates patient has "lesions" on her tongue and asks if ok with Dr. Lindi Adie for patient to have "routine" dental work.  Tiffany placed the form and copy of my phone note below in Dr. Geralyn Flash office this morning for his review.  Dr Lindi Adie responded that we can assess her lesions on her next visit.  I called patient back to inform her of his response.  I instructed patient to call Dr. Geralyn Flash office back if her sores/lesions get worse or she has any pain or difficulty eating/swallowing prior to next visit.  She verbalized understanding.  Foye Spurling, BSN, RN Clinical Research Nurse 05/11/2017 4:15 PM

## 2017-05-11 NOTE — Telephone Encounter (Signed)
Patient called research nurse to report new symptoms.  States went to dentist yesterday and he noticed "a lot of white spots under my tongue."  Patient states she also has one on the tip of her tongue and had not noticed any of these white spots until yesterday.  States the spots under her tongue are not painful, but the tip of her tongue is a little sore.  Denies any sore throat.  She says her dentist is going to refer her back to Dr. Lindi Adie for "pathology" of these spots.  Informed patient I will inform Dr. Lindi Adie and call her back.  She is available to come in later this afternoon if she needs to be seen.  Her next treatment for Keytruda is 05/24/17.   Foye Spurling, BSN, RN Clinical Research Nurse 05/11/2017 11:35 AM

## 2017-05-12 ENCOUNTER — Telehealth: Payer: Self-pay | Admitting: *Deleted

## 2017-05-12 NOTE — Telephone Encounter (Signed)
Called patient to check on her tongue lesions.  Patient says the white spots that were under her tongue are gone now.  She still has one small spot on each side of her tongue, but denies any pain.  Patient says she thinks the spots are from her tongue rubbing on her dentures.  She says she will let us know if they get worse again.  Thanked patient for the update and look forward to seeing her again for next Keytruda infusion on 05/24/17.  Patient verbalized understanding.  Foye Spurling, BSN, RN Clinical Research Nurse 05/12/2017 2:10 PM

## 2017-05-24 ENCOUNTER — Encounter: Payer: Self-pay | Admitting: *Deleted

## 2017-05-24 ENCOUNTER — Ambulatory Visit (HOSPITAL_BASED_OUTPATIENT_CLINIC_OR_DEPARTMENT_OTHER): Payer: Medicare Other

## 2017-05-24 VITALS — BP 125/59 | HR 60 | Temp 98.5°F | Resp 18

## 2017-05-24 DIAGNOSIS — Z5112 Encounter for antineoplastic immunotherapy: Secondary | ICD-10-CM

## 2017-05-24 DIAGNOSIS — C50211 Malignant neoplasm of upper-inner quadrant of right female breast: Secondary | ICD-10-CM

## 2017-05-24 DIAGNOSIS — Z006 Encounter for examination for normal comparison and control in clinical research program: Secondary | ICD-10-CM | POA: Diagnosis not present

## 2017-05-24 DIAGNOSIS — Z171 Estrogen receptor negative status [ER-]: Principal | ICD-10-CM

## 2017-05-24 MED ORDER — HEPARIN SOD (PORK) LOCK FLUSH 100 UNIT/ML IV SOLN
500.0000 [IU] | Freq: Once | INTRAVENOUS | Status: AC | PRN
Start: 2017-05-24 — End: 2017-05-24
  Administered 2017-05-24: 500 [IU]
  Filled 2017-05-24: qty 5

## 2017-05-24 MED ORDER — SODIUM CHLORIDE 0.9% FLUSH
10.0000 mL | INTRAVENOUS | Status: DC | PRN
  Administered 2017-05-24: 10 mL
  Filled 2017-05-24: qty 10

## 2017-05-24 MED ORDER — SODIUM CHLORIDE 0.9 % IV SOLN
Freq: Once | INTRAVENOUS | Status: AC
  Administered 2017-05-24: 14:00:00 via INTRAVENOUS

## 2017-05-24 MED ORDER — SODIUM CHLORIDE 0.9 % IV SOLN
200.0000 mg | Freq: Once | INTRAVENOUS | Status: AC
  Administered 2017-05-24: 200 mg via INTRAVENOUS
  Filled 2017-05-24: qty 8

## 2017-05-24 NOTE — Patient Instructions (Signed)
[facility] Discharge Instructions for Patients Receiving Chemotherapy  Today you received the following chemotherapy agents:  Keytruda.  To help prevent nausea and vomiting after your treatment, we encourage you to take your nausea medication as directed.   If you develop nausea and vomiting that is not controlled by your nausea medication, call the clinic.   BELOW ARE SYMPTOMS THAT SHOULD BE REPORTED IMMEDIATELY:  *FEVER GREATER THAN 100.5 F  *CHILLS WITH OR WITHOUT FEVER  NAUSEA AND VOMITING THAT IS NOT CONTROLLED WITH YOUR NAUSEA MEDICATION  *UNUSUAL SHORTNESS OF BREATH  *UNUSUAL BRUISING OR BLEEDING  TENDERNESS IN MOUTH AND THROAT WITH OR WITHOUT PRESENCE OF ULCERS  *URINARY PROBLEMS  *BOWEL PROBLEMS  UNUSUAL RASH Items with * indicate a potential emergency and should be followed up as soon as possible.  Feel free to call the clinic should you have any questions or concerns. The clinic phone number is [phone].  Please show the CHEMO ALERT CARD at check-in to the Emergency Department and triage nurse.    

## 2017-05-24 NOTE — Progress Notes (Signed)
Cycle 1, Day 22 visit for [phone] clinical trial: Patient into clinic today for infusion of Pembrolizumab.   Patient reports she still has some sores and "bumps" along just the left side of her tongue.  She feels it is because her tongue rubs on her dentures and she will talk to her dentist about this.  She denies any other sores or lesions in her mouth and denies any difficulty eating or chewing.  Patient says she has noticed a intermittent, dry cough for about 1 1/2 weeks.  Patient denies any fevers, sob, chest tightness or wheezing. She says the cough does not interfere with any of her activities.  Instructed patient to contact Dr. Geralyn Flash office if her symptoms worsen before her next appointment, especially if she has any trouble breathing.  Patient verbalized understanding.  Spoke with infusion RN, [employer] regarding infusion prior to patient's appointment.  Gave her written instructions to infuse Pembrolizumab over 30 minutes as ordered.  Informed her there is window of -5 minutes and + 10 minutes to infuse.  Please use inline filter provided by pharmacy and document start and stop times per policy. Please call or page infusion RN if any questions or problems.  She agreed.  Foye Spurling, BSN, RN Clinical Research Nurse 05/24/2017 1:30 PM

## 2017-06-13 ENCOUNTER — Telehealth: Payer: Self-pay | Admitting: *Deleted

## 2017-06-13 DIAGNOSIS — C50211 Malignant neoplasm of upper-inner quadrant of right female breast: Secondary | ICD-10-CM

## 2017-06-13 DIAGNOSIS — Z79899 Other long term (current) drug therapy: Secondary | ICD-10-CM

## 2017-06-13 DIAGNOSIS — Z171 Estrogen receptor negative status [ER-]: Principal | ICD-10-CM

## 2017-06-13 NOTE — Telephone Encounter (Signed)
Notified Dr. Lindi Adie of patients phone call in note below.  Informed him of her c/o back problems and her episode of diarrhea and brief disorientation following.  Dr. Lindi Adie says he can see patient tomorrow as scheduled to assess her symptoms and informed him patient wanted to cancel tomorrow but I will follow up with her to see if she wants to come in another day.  Research nurse will review the study protocol/ guidelines to find out the best way to proceed and then call patient back to follow up.   Appointments for tomorrow canceled per patients request.  Foye Spurling, BSN, RN Clinical Research Nurse 06/13/2017 1:08 PM

## 2017-06-13 NOTE — Telephone Encounter (Signed)
Patient called research nurse to notify that she does not want to continue on the S1418 clinical trial.  Spoke with patient for 20 minutes and listened to her reasons for wanting to withdrawal.  Patient states she has "thought and thought and thought about this and my heart just isn't in it".   Patient talked about being alone in [place] with no family or friends support, so she is planning on relocating to be closer to her family at some point.  Although she admits this might be a year from now.   She says she has been told she needs to have cataract surgery and will need to hire someone to drive her to and from the procedure.  She c/o feeling "old" and having a "creaky" back.  Patient states having some intermittent back pain and mobility issues especially first thing in the morning which improves later in the day.  She has noticed it more in the past few weeks since Christmas.  She says back problems are not new and she thinks related to Osteopenia.  Patient plans to see her PCP regarding back problems.  Encouraged patient to keep her appointment as scheduled with Dr. Lindi Adie tomorrow to discuss the study and assess her back.  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  She has a goal to complete her certification and says coming in to the clinic for study visits/ treatment is keeping her from this goal.  She wants to cancel her appointments tomorrow and will see her PCP for back issues.  Asked patient if she felt she was having any other symptoms or possible side effects from the study drug.  She reports she had one episode of diarrhea for 12 hrs starting at 2 am this past Saturday morning (06/10/17).  She had at least 10 stools over the 12 hrs and did not take any medication for it.  Patient says she has had episodes of diarrhea in the past, prior to study, that resolved on it's own similar to this time.  She says she did not have any further  diarrhea after Saturday afternoon, but she woke up Sunday morning feeling disoriented and thinking it was Monday morning.  Suggested to patient she may have been very dehydrated.  Patient says diarrhea resolved completely Saturday afternoon and she did not have any more disorientation on Sunday after she realized what day it was.  She agrees she could have been dehydrated and she will try to drink more fluids.  She denies having any fevers although she says she did not actually check her temperature. Patient talked about not knowing whether or not the Pembrolizumab will actually help prevent her cancer from returning.  Explained to patient that the study cannot say if it will help her or not, this is the purpose of the study to find out.  Patient asked what her chance of cancer recurrence is and I explained it is higher for triple negative breast cancer, but she would need to discuss with Dr.Gudena.  Patient also asks if Dr. Lindi Adie will arrange with Dr. Lucia Gaskins to have her PAC removed.  Informed patient I will notify Dr. Lindi Adie of our discussion, we will cancel her appointment for tomorrow and will call back to follow up after I speak with Dr. Lindi Adie.  Patient verbalized understanding.  Foye Spurling, BSN, RN Clinical Research Nurse 06/13/2017 11:30 AM

## 2017-06-13 NOTE — Assessment & Plan Note (Signed)
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  T2 N0 stage 2A clinical stage  Treatment summary: 1. Neoadjuvant chemotherapy with dose dense Adriamycin and Cytoxan 4 followed by Abraxane weekly 5(stopped early due to neuropathy) 2. 09/13/2016: Right lumpectomy: IDC grade 3, 2 foci, 2 cm and 1.1 cm, 0/3 lymph nodes negative margins negative, ER 0%, PR 0%, HER-2 negative ratio 1.02, Ki-67 60%, RCB-II; ypT2ypN0 Stage 2A (with plastic surgery removing the ruptured implant) 3. Followed by radiation therapy completed 12/06/2016 4. Adjuvant Xeloda 1000 mg by mouth twice a day 2 weeks on one week off 12/30/2016- Oct 2018 5. SWOG 1418 clinical trial Pembrolizumab cycle 1 given 05/03/2017 (patient decided to discontinue clinical trial) ----------------------------------------------------------------------------------------------------------------------------------------- Current treatment:  SWOG 1418 clinical trial Pembrolizumab versus observation; patient is randomized to Pembrolizumab  Pembrolizumab toxicities: 1.  Diarrhea  2. Fatigue  Patient wishes to stop the clinical trial and get the port out.  It appears that she is not stopping the clinical trial for toxicities but rather because of sudden loss of interest in pursuing more treatment.  It appears to be more psychological than anything else.  She is convinced that she does not want to take immunotherapy.  I discussed with her about the high risk of recurrence in patients who have residual disease after neoadjuvant chemotherapy. She understands these risks and is wishing to stop her treatment at this time.  Return to clinic in 6 months for surveillance

## 2017-06-13 NOTE — Telephone Encounter (Signed)
Reviewed study protocol and end of treatment assessment table.  Called patient back to inform her that Dr. Lindi Adie would like to see her tomorrow if she is able and we can try to find another time of day that works better for her.  Informed her we want to make sure she is not having any side effects from the study drug or related to her cancer.  Also informed her the study asks for some end of treatment research labs and questionnaires to be completed when she comes in if she is willing.  Patient said she would be willing to come in to see Dr. Lindi Adie tomorrow at the time she was originally scheduled.  She says she is also willing to have research labs drawn and complete questionnaires for the end of study. 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  Thanked patient for her willingness to be seen tomorrow and complete study procedures. Look forward to seeing her tomorrow.  Appointments rescheduled.  Foye Spurling, BSN, RN Clinical Research Nurse 06/13/2017 2:24 PM

## 2017-06-14 ENCOUNTER — Inpatient Hospital Stay: Payer: Medicare Other | Attending: Radiation Oncology

## 2017-06-14 ENCOUNTER — Inpatient Hospital Stay: Payer: Medicare Other

## 2017-06-14 ENCOUNTER — Encounter: Payer: Self-pay | Admitting: *Deleted

## 2017-06-14 ENCOUNTER — Other Ambulatory Visit: Payer: Self-pay | Admitting: *Deleted

## 2017-06-14 ENCOUNTER — Ambulatory Visit: Payer: Medicare Other | Admitting: Hematology and Oncology

## 2017-06-14 ENCOUNTER — Ambulatory Visit: Payer: Medicare Other

## 2017-06-14 ENCOUNTER — Other Ambulatory Visit: Payer: Medicare Other

## 2017-06-14 ENCOUNTER — Inpatient Hospital Stay: Payer: Medicare Other | Attending: Hematology and Oncology | Admitting: Hematology and Oncology

## 2017-06-14 DIAGNOSIS — Z79899 Other long term (current) drug therapy: Secondary | ICD-10-CM | POA: Insufficient documentation

## 2017-06-14 DIAGNOSIS — Z171 Estrogen receptor negative status [ER-]: Secondary | ICD-10-CM | POA: Insufficient documentation

## 2017-06-14 DIAGNOSIS — Z9221 Personal history of antineoplastic chemotherapy: Secondary | ICD-10-CM | POA: Diagnosis not present

## 2017-06-14 DIAGNOSIS — Z923 Personal history of irradiation: Secondary | ICD-10-CM | POA: Diagnosis not present

## 2017-06-14 DIAGNOSIS — C50211 Malignant neoplasm of upper-inner quadrant of right female breast: Secondary | ICD-10-CM | POA: Diagnosis not present

## 2017-06-14 DIAGNOSIS — Z006 Encounter for examination for normal comparison and control in clinical research program: Secondary | ICD-10-CM | POA: Diagnosis not present

## 2017-06-14 DIAGNOSIS — Z452 Encounter for adjustment and management of vascular access device: Secondary | ICD-10-CM | POA: Diagnosis not present

## 2017-06-14 DIAGNOSIS — Z95828 Presence of other vascular implants and grafts: Secondary | ICD-10-CM

## 2017-06-14 LAB — CBC WITH DIFFERENTIAL/PLATELET
Abs Granulocyte: 2.3 10*3/uL (ref 1.5–6.5)
BASOS ABS: 0 10*3/uL (ref 0.0–0.1)
Basophils Relative: 1 %
EOS ABS: 0.1 10*3/uL (ref 0.0–0.5)
EOS PCT: 3 %
HCT: 38.2 % (ref 34.8–46.6)
Hemoglobin: 12.9 g/dL (ref 11.6–15.9)
LYMPHS PCT: 26 %
Lymphs Abs: 1 10*3/uL (ref 0.9–3.3)
MCH: 34.5 pg — ABNORMAL HIGH (ref 25.1–34.0)
MCHC: 33.8 g/dL (ref 31.5–36.0)
MCV: 102 fL — ABNORMAL HIGH (ref 79.5–101.0)
Monocytes Absolute: 0.3 10*3/uL (ref 0.1–0.9)
Monocytes Relative: 9 %
NEUTROS ABS: 2.3 10*3/uL (ref 1.5–6.5)
NEUTROS PCT: 61 %
Platelets: 142 10*3/uL — ABNORMAL LOW (ref 145–400)
RBC: 3.75 MIL/uL (ref 3.70–5.45)
RDW: 13.7 % (ref 11.2–16.1)
WBC: 3.7 10*3/uL — AB (ref 3.9–10.3)

## 2017-06-14 LAB — RESEARCH LABS

## 2017-06-14 LAB — TSH: TSH: 0.454 u[IU]/mL (ref 0.308–3.960)

## 2017-06-14 LAB — COMPREHENSIVE METABOLIC PANEL
ALT: 52 U/L (ref 0–55)
ANION GAP: 8 (ref 3–11)
AST: 44 U/L — ABNORMAL HIGH (ref 5–34)
Albumin: 3.9 g/dL (ref 3.5–5.0)
Alkaline Phosphatase: 71 U/L (ref 40–150)
BUN: 15 mg/dL (ref 7–26)
CALCIUM: 9.1 mg/dL (ref 8.4–10.4)
CHLORIDE: 105 mmol/L (ref 98–109)
CO2: 24 mmol/L (ref 22–29)
CREATININE: 0.76 mg/dL (ref 0.60–1.10)
Glucose, Bld: 181 mg/dL — ABNORMAL HIGH (ref 70–140)
Potassium: 4.2 mmol/L (ref 3.3–4.7)
Sodium: 137 mmol/L (ref 136–145)
Total Bilirubin: 1.1 mg/dL (ref 0.2–1.2)
Total Protein: 6.9 g/dL (ref 6.4–8.3)

## 2017-06-14 MED ORDER — SODIUM CHLORIDE 0.9% FLUSH
10.0000 mL | INTRAVENOUS | Status: DC | PRN
  Administered 2017-06-14: 10 mL via INTRAVENOUS
  Filled 2017-06-14: qty 10

## 2017-06-14 MED ORDER — ALTEPLASE 2 MG IJ SOLR
2.0000 mg | Freq: Once | INTRAMUSCULAR | Status: AC | PRN
  Administered 2017-06-14: 2 mg
  Filled 2017-06-14: qty 2

## 2017-06-14 MED ORDER — ALTEPLASE 2 MG IJ SOLR
INTRAMUSCULAR | Status: AC
  Filled 2017-06-14: qty 2

## 2017-06-14 NOTE — Progress Notes (Signed)
Patient Care Team: Tammi Sou, MD as PCP - General (Family Medicine) Alphonsa Overall, MD as Consulting Physician (General Surgery) Nicholas Lose, MD as Consulting Physician (Hematology and Oncology) Kyung Rudd, MD as Consulting Physician (Radiation Oncology) Irene Limbo, MD as Consulting Physician (Plastic Surgery) Gerome Apley, MD as Consulting Physician (Endocrinology) Delice Bison Charlestine Massed, NP as Nurse Practitioner (Hematology and Oncology)  DIAGNOSIS:  Encounter Diagnosis  Name Primary?  . Malignant neoplasm of upper-inner quadrant of right breast in female, estrogen receptor negative ([place])     SUMMARY OF ONCOLOGIC HISTORY:   Breast cancer of upper-inner quadrant of right female breast ([place])   02/29/2016 Initial Diagnosis    Right breast palpable mass (with silicone implants 9371), 3.5 cm on MRI, additional 3 cm anterior linear enhancement? 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 Ki-67 60%, T2 N0 stage 2A clinical stage      03/14/2016 Procedure    Right breast biopsy upper inner quadrant: IDC grade 3      03/28/2016 -  Neo-Adjuvant Chemotherapy    Neoadjuvant chemotherapy with dose dense Adriamycin and Cytoxan followed by Abraxane weekly 5 ( patient is diabetic and cannot take steroids)      07/15/2016 Breast MRI    Right breast: Spiculated mass 1.5 cm significantly smaller compared to prior,NME previously seen is not noted, no abnormal lymph nodes      09/13/2016 Surgery    Removal of the silicone implant due to intracapsular rupture and capsulectomy (Dr.Thimappa)      09/13/2016 Surgery    Right lumpectomy: IDC grade 3, 2 foci, 2 cm and 1.1 cm, 0/3 lymph nodes negative margins negative, ER 0%, PR 0%, HER-2 negative ratio 1.02, Ki-67 60%, RCB-II; ypT2ypN0 Stage 2A       10/20/2016 - 12/06/2016 Radiation Therapy    Adjuvant radiation therapy      12/30/2016 - 04/05/2017 Chemotherapy    Xeloda 1000 mg by mouth twice a day adjuvant therapy x 4  cycles       05/03/2017 - 05/24/2017 Chemotherapy    SWOG S 1418 Pembrolizumab on clinical trial stopped after 2 doses by patient preference (not due to toxicities .)       CHIEF COMPLIANT: Follow-up at the conclusion of [id number] clinical trial  INTERVAL HISTORY: Marissa Hall is a 77 year old with above-mentioned history of right breast cancer triple negative disease who underwent neoadjuvant chemotherapy followed by lumpectomy followed by Xeloda and radiation.  She received 2 doses of pembrolizumab on immunotherapy clinical trial and decided that she does not want to continue this anymore.  He does periodically for personal reasons.  She tolerated the treatment extremely well.  She had a few issues related to diarrhea.  The diarrhea lasted 12 hours.  She tells me that she has had previous intermittent diarrhea which resolves on its own.  She did not have to take Imodium.  Subsequently the next day she felt mildly disoriented regarding the day of the week but otherwise did not have any major sequelae from the diarrhea.  She had mild mouth sore and that has resolved as well.  She has no further side effects currently related to Pembrolizumab.  REVIEW OF SYSTEMS:   Constitutional: Denies fevers, chills or abnormal weight loss Eyes: Denies blurriness of vision Ears, nose, mouth, throat, and face: Denies mucositis or sore throat Respiratory: Denies cough, dyspnea or wheezes Cardiovascular: Denies palpitation, chest discomfort Gastrointestinal:  Denies nausea, heartburn or change in bowel habits Skin: Denies abnormal skin rashes  Lymphatics: Denies new lymphadenopathy or easy bruising Neurological:Denies numbness, tingling or new weaknesses Behavioral/Psych: Mood is stable, no new changes  Extremities: No lower extremity edema All other systems were reviewed with the patient and are negative.  I have reviewed the past medical history, past surgical history, social history and family history  with the patient and they are unchanged from previous note.  ALLERGIES:  is allergic to other and penicillins.  MEDICATIONS:  Current Outpatient Medications  Medication Sig Dispense Refill  . atorvastatin (LIPITOR) 80 MG tablet Take 80 mg by mouth every evening.     [name] Kitchen azelastine (ASTELIN) 0.1 % nasal spray Place 2 sprays into both nostrils daily as needed for rhinitis or allergies. Use in each nostril as directed    . DULoxetine (CYMBALTA) 30 MG capsule Take 1 capsule (30 mg total) by mouth daily. 90 capsule 1  . levothyroxine (SYNTHROID, LEVOTHROID) 112 MCG tablet Take 1 tablet by mouth daily.    [name] Kitchen lidocaine-prilocaine (EMLA) cream Apply 1 application topically as needed. 30 g 1  . lisinopril (PRINIVIL,ZESTRIL) 20 MG tablet Take 20 mg by mouth every evening.     . magnesium oxide (MAG-OX) 400 (241.3 Mg) MG tablet Take 1.5 tablets (600 mg total) by mouth daily. (Patient taking differently: Take 600 mg as needed by mouth (Leg Cramps). ) 30 tablet 3  . metFORMIN (GLUCOPHAGE-XR) 500 MG 24 hr tablet Take 1,000 mg by mouth daily with supper.     [name] Kitchen omeprazole (PRILOSEC) 40 MG capsule Take 1 capsule (40 mg total) by mouth daily. (Patient taking differently: Take 40 mg as needed by mouth (Heart Burn). ) 30 capsule 2  . pyridoxine (B-6) 100 MG tablet Take 100 mg by mouth daily.     . traZODone (DESYREL) 50 MG tablet 1-3 tabs po qhs prn anxiety (Patient taking differently: Take 25 mg at bedtime as needed by mouth for sleep (1/2 tablet as needed for sleep). 1-3 tabs po qhs prn anxiety) 60 tablet 3  . Vitamin D, Ergocalciferol, (DRISDOL) 50000 units CAPS capsule Take 50,000 Units by mouth every 7 (seven) days. Saturdays     No current facility-administered medications for this visit.     PHYSICAL EXAMINATION: ECOG PERFORMANCE STATUS: 1 - Symptomatic but completely ambulatory  Vitals:   06/14/17 1127  BP: (!) 172/74  Pulse: (!) 54  Resp: 19  Temp: 97.8 F (36.6 C)  SpO2: 99%   Filed Weights    06/14/17 1127  Weight: 176 lb 1.6 oz (79.9 kg)    GENERAL:alert, no distress and comfortable SKIN: skin color, texture, turgor are normal, no rashes or significant lesions EYES: normal, Conjunctiva are pink and non-injected, sclera clear OROPHARYNX:no exudate, no erythema and lips, buccal mucosa, and tongue normal  NECK: supple, thyroid normal size, non-tender, without nodularity LYMPH:  no palpable lymphadenopathy in the cervical, axillary or inguinal LUNGS: clear to auscultation and percussion with normal breathing effort HEART: regular rate & rhythm and no murmurs and no lower extremity edema ABDOMEN:abdomen soft, non-tender and normal bowel sounds MUSCULOSKELETAL:no cyanosis of digits and no clubbing  NEURO: alert & oriented x 3 with fluent speech, no focal motor/sensory deficits EXTREMITIES: No lower extremity edema  LABORATORY DATA:  I have reviewed the data as listed CMP Latest Ref Rng & Units 05/01/2017 04/17/2017 03/08/2017  Glucose 70 - 140 mg/dl 156(H) 136 211(H)  BUN 7.0 - 26.0 mg/dL 14.9 12.0 13.7  Creatinine 0.6 - 1.1 mg/dL 0.8 0.8 0.8  Sodium 136 - 145 mEq/L 136  139 140  Potassium 3.5 - 5.1 mEq/L 4.5 4.3 4.0  Chloride 101 - 111 mmol/L - - -  CO2 22 - 29 mEq/L _0 Calcium 8.4 - 10.4 mg/dL 9.6 9.4 9.5  Total Protein 6.4 - 8.3 g/dL 6.9 7.1 7.2  Total Bilirubin 0.20 - 1.20 mg/dL 1.30(H) 1.36(H) 1.33(H)  Alkaline Phos 40 - 150 U/L 75 75 88  AST 5 - 34 U/L 51(H) 49(H) 69(H)  ALT 0 - 55 U/L 62(H) 45 56(H)    Lab Results  Component Value Date   WBC 3.3 (L) 05/01/2017   HGB 13.0 05/01/2017   HCT 38.7 05/01/2017   MCV 106.0 (H) 05/01/2017   PLT 134 (L) 05/01/2017   NEUTROABS 1.9 05/01/2017    ASSESSMENT & PLAN:  Breast cancer of upper-inner quadrant of right female breast ([place]) 02/29/2016: Right breast palpable mass (with silicone implants 1655), 3.5 cm on MRI, additional 3 cm anterior linear enhancement (biopsy 03/14/2016 IDC grade 3); grade 3 IDC triple  negative Ki-67 60%.  T2 N0 stage 2A clinical stage  Treatment summary: 1. Neoadjuvant chemotherapy with dose dense Adriamycin and Cytoxan 4 followed by Abraxane weekly 5(stopped early due to neuropathy) 2. 09/13/2016: Right lumpectomy: IDC grade 3, 2 foci, 2 cm and 1.1 cm, 0/3 lymph nodes negative margins negative, ER 0%, PR 0%, HER-2 negative ratio 1.02, Ki-67 60%, RCB-II; ypT2ypN0 Stage 2A (with plastic surgery removing the ruptured implant) 3. Followed by radiation therapy completed 12/06/2016 4. Adjuvant Xeloda 1000 mg by mouth twice a day 2 weeks on one week off 12/30/2016- Oct 2018 5. SWOG 1418 clinical trial Pembrolizumab cycle 1 given 05/03/2017 (patient decided to discontinue clinical trial) ----------------------------------------------------------------------------------------------------------------------------------------- Current treatment:  SWOG 1418 clinical trial Pembrolizumab versus observation; patient is randomized to Pembrolizumab  Pembrolizumab toxicities: 1.  Diarrhea  2. Fatigue  Patient wishes to stop the clinical trial and get the port out.  It appears that she is not stopping the clinical trial for toxicities but rather because of sudden loss of interest in pursuing more treatment.  It appears to be more psychological than anything else.  She is convinced that she does not want to take any further therapy.  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  I discussed with her about the high risk of recurrence in patients who have residual disease after neoadjuvant chemotherapy. She understands these risks and is wishing to stop her treatment at this time.  Return to clinic in 6 months for surveillance with labs  I spent 25 minutes talking to the patient of which more than half was spent in counseling and coordination of care.  No orders of the defined types  were placed in this encounter.  The patient has a good understanding of the overall plan. she agrees with it. she will call with any problems that may develop before the next visit here.   Harriette Ohara, MD 06/14/17

## 2017-06-14 NOTE — Progress Notes (Signed)
06/14/17 End of Study Assessment visit for Z0258 Clinical Trial; Patient came into clinic by herself today.  Research nurse met her in the lobby and gave her study questionnaires to complete while waiting for her lab appointment.  I then accompanied patient on her visit with Dr. Lindi Adie.  The [place] nurse was not able to get blood from patients [facility] for any labs so he instilled some Cath-Flo per MD standing orders. Patient has had difficulty with blood flow from her PAC in the past on several occasions.  Patient returned questionnaires and research nurse checked them for completeness and accuracy.  Dr. Lindi Adie examined patient and reviewed toxicities.  Patient reports her cough and mouth sores are resolved.  She thinks her fatigue is not any worse than prior to starting on study, but also not improved.  She reports the 12 hr episode of diarrhea she had on Saturday.  She states feeling better and no further diarrhea.  She denies any further confusion/disorientation since Sunday morning. She thinks she is drinking enough fluids.  She reports lower back soreness and weakness which she says has been intermittent problem since prior to study and recurring again over the past few weeks since Christmas.  Blood pressure is elevated and patient states she forgot to take her lisinopril last night but she will remember to take it tonight.  Dr. Lindi Adie discussed patient's desire to go off study and stop treatment. She says it is not due to any side effects of the treatment. Patient says she does not want to come in every 3 weeks for appointments any more as she has a lot of other things in her life she needs to attend.  Patient is willing to allow the study to continue to collect information on follow up visits.  She is willing to come in every 6 months for research labs, questionnaires and to see Dr. Lindi Adie per the follow up time and events table in protocol.  After MD visit completed, this RN withdrew the Cath-Flo from her PAC  and was able to get enough blood return for the labs and collected blood as required by study.   The blood specimen for [facility] sub study was obtained after 10 am due to patient was already scheduled for lab this day after 10 am as it was previously planned to be a Week 7 visit (which did not require [facility] lab) and not End of Treatment Study (which does require [facility]).    New or Worsening AEs for Cycle 1; 05/03/17-06/14/17;  Event Grade Onset Date End Date Status Comment Attribution to Pembrolizumab  Mouth Sores 1 05/10/17  06/14/17 resolved  Possible  Dry Cough 1 05/14/17  06/14/17 resolved  Unlikely  Diarrhea 3 06/10/17 06/10/17 resolved  Probable  Confusion 1 06/11/17  06/11/17     resolved  Unlikely  Back Pain 1 05/30/17  ongoing Intermittent prior to starting on study Unlikely  Hypertension 3 06/14/17  ongoing Patient forgot to take Lisinopril the night before.  Grade 2 at baseline. Unlikely   Foye Spurling, BSN, RN Clinical Research Nurse 06/16/2017 11:39 AM

## 2017-06-16 ENCOUNTER — Telehealth: Payer: Self-pay | Admitting: Hematology and Oncology

## 2017-06-16 NOTE — Telephone Encounter (Signed)
Scheduled appt per 1/9 los - sent reminder letter in the mail - lab and f/u in 6 months.

## 2017-06-19 ENCOUNTER — Encounter: Payer: Self-pay | Admitting: *Deleted

## 2017-06-19 DIAGNOSIS — H2513 Age-related nuclear cataract, bilateral: Secondary | ICD-10-CM | POA: Diagnosis not present

## 2017-06-19 DIAGNOSIS — C50211 Malignant neoplasm of upper-inner quadrant of right female breast: Secondary | ICD-10-CM

## 2017-06-19 DIAGNOSIS — Z171 Estrogen receptor negative status [ER-]: Principal | ICD-10-CM

## 2017-06-19 DIAGNOSIS — H16223 Keratoconjunctivitis sicca, not specified as Sjogren's, bilateral: Secondary | ICD-10-CM | POA: Diagnosis not present

## 2017-06-19 NOTE — Progress Notes (Signed)
06/15/17 I spoke with Elfredia Nevins, [place] with SWOG by telephone on 06/15/17.  Informed her that patient wishes to go off treatment but is willing to stay on study for data collection.  Asked if patient should now be followed on the "Post Treatment Follow-Up Prior to Recurrence" schedule which involves every 6 month study visits.  I was informed by Jethro Bastos that since patient did not complete 2 cycles of treatment, she should be followed on the Observation schedule for Arm 1 instead, which involves every 3 months study visits until she has completed the year.  Patient would then be followed according the the "Post Treatment Follow-Up Prior to Recurrence" schedule after week 55.  Asked Geni Bers if patient is not willing to come in every 3 months can we still follow every 6 months or does she need to go off study completely?  Geni Bers said that if patient does not come in at 3 months, then research nurse can mark that visit as not done and continue to collect data when patient does come in for visits.  When asked if a missed visit would be considered a deviation for our site, she responded that it would not be considered a deviation from SWOG stand point.    06/19/17 Called patient to discuss study schedule for follow up visits.  Informed her that this research nurse was notified that since patient has not completed 2 cycles of treatment, the study would like to follow her every 3 months for the rest of the year instead of every 6 months as I had previously told patient. She would then be moved over to the every 6 month schedule.  Study visits would involve research labs, questionnaires and MD visits.  Patient states she is willing to come in for study visits every 3 months.  She voiced concern about lab being able to get blood from her veins since she will be having her PAC removed.  Suggested to patient to allow phlebotomist one stick and if they cannot get blood, then we  will not collect blood on that visit.  She agreed with this plan.  Thanked patient very much for continuing to stay on study and allowing [place] to collect her information.  Will request next visit in 3 months.  Encouraged patient to call us if she has any questions or concerns prior to next appointment in 3 months.  Patient verbalized understanding. Notified Dr.Gudena that the study requests every 3 months visits for the rest of the year and patient is in agreement.  Dr. Lindi Adie agrees to see patient again in 3 months time for study visit.  Request sent to scheduler.  Foye Spurling, BSN, RN Clinical Research Nurse 06/19/2017 10:43 AM

## 2017-06-20 ENCOUNTER — Telehealth: Payer: Self-pay | Admitting: Hematology and Oncology

## 2017-06-20 NOTE — Telephone Encounter (Signed)
Spoke to patient regarding upcoming April and June appointments per 1/14 sch message

## 2017-06-28 ENCOUNTER — Encounter: Payer: Self-pay | Admitting: *Deleted

## 2017-06-28 DIAGNOSIS — N6011 Diffuse cystic mastopathy of right breast: Secondary | ICD-10-CM | POA: Diagnosis not present

## 2017-06-28 DIAGNOSIS — C50911 Malignant neoplasm of unspecified site of right female breast: Secondary | ICD-10-CM | POA: Diagnosis not present

## 2017-06-28 NOTE — Progress Notes (Signed)
Notified Arn Medal, Director of Clinical Coordination at [facility] that [phone] patient withdrew consent for treatment but agreed to be followed for data collection.   I collected study blood for [facility] sub-study when patient was here on 06/14/17 for Cycle 2, Week 7 visit.   (I thought we were supposed to collect it as "End of Treatment assessment" since patient was going off treatment.)  We marked the specimen as "Week 55" because this corresponded to the "End of Treatment" time point.    After further review of the Protocol and communication with [employer] manager, Haze Boyden, it has been determined that the specimen should not have been collected on this visit as it does not fit into any of the specified time points.  Sula Soda YELYHTM states there is nothing that needs to be done by our site regarding the specimen that was sent at the Week 7 visit.  She said the patient was ending treatment, so technically the specimen is "End of Treatment" specimen.  She said it is not necessary for [place] to do anything else at this time.  Will continue to collect specimens per protocol time line and as patient is willing for the [place].  Foye Spurling, BSN, RN Clinical Research Nurse 06/28/2017 4:38 PM

## 2017-07-03 ENCOUNTER — Encounter: Payer: Self-pay | Admitting: Family Medicine

## 2017-07-03 DIAGNOSIS — Z452 Encounter for adjustment and management of vascular access device: Secondary | ICD-10-CM | POA: Diagnosis not present

## 2017-07-05 ENCOUNTER — Ambulatory Visit: Payer: Medicare Other

## 2017-07-07 DIAGNOSIS — H269 Unspecified cataract: Secondary | ICD-10-CM | POA: Insufficient documentation

## 2017-07-07 DIAGNOSIS — H2513 Age-related nuclear cataract, bilateral: Secondary | ICD-10-CM | POA: Diagnosis not present

## 2017-07-07 DIAGNOSIS — H16223 Keratoconjunctivitis sicca, not specified as Sjogren's, bilateral: Secondary | ICD-10-CM | POA: Diagnosis not present

## 2017-07-07 DIAGNOSIS — H2511 Age-related nuclear cataract, right eye: Secondary | ICD-10-CM | POA: Diagnosis not present

## 2017-07-07 HISTORY — PX: OTHER SURGICAL HISTORY: SHX169

## 2017-07-07 HISTORY — DX: Unspecified cataract: H26.9

## 2017-07-20 IMAGING — MR MR BREAST BX W/ LOC DEV 1ST LEASION IMAGE BX SPEC MR GUIDE*R*
10 of 13 series · 33 of 48 positions shown · IV contrast (17 ML Multihance)
Comparison: Previous exams from [REDACTED] [facility] and bilateral
breast MRI 03/04/2016

ADDENDUM:
Pathology revealed grade III invasive ductal carcinoma in the right
breast. This was found to be concordant by Dr. Kayu Hirayama.
Pathology results were discussed with Dr. Sairam Genova. The patient
is scheduled for porta-catheter insertion on March 15, 2016. Dr.
Lucidio Bass discuss the results with the patient prior to her
surgery. She is to follow her outlined treatment plan for known
right breast cancer.

Pathology results reported by Maria Noelia Bonzano RN, BSN on 03/16/2016.
CLINICAL DATA: Recent diagnosis of right breast cancer following
biopsy of a palpable suspicious mass in the upper inner quadrant. A
breast MRI was subsequently performed, demonstrating approximately 3
cm of linear enhancement extending inferior and inferior to the
biopsied mass. MRI guided biopsy is requested of the linear
enhancement.
EXAM:
MRI GUIDED CORE NEEDLE BIOPSY OF THE RIGHT BREAST
TECHNIQUE: Multiplanar, multisequence MR imaging of the right breast was
performed both before and after administration of intravenous
contrast.
CONTRAST:  17mL MULTIHANCE GADOBENATE DIMEGLUMINE 529 MG/ML IV SOLN

[Series 3: axial pre-cm · axial · non-contrast · 1.3mm · 0.73mm/px · z∈[-69,+117]mm · 3 of 144 slices shown]
[im 1/144]
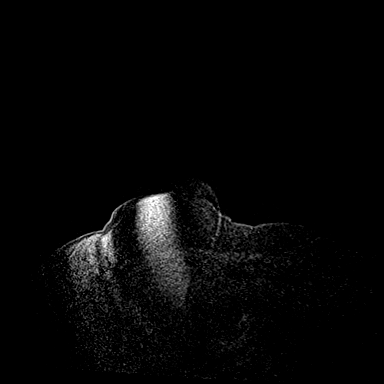
[im 72/144]
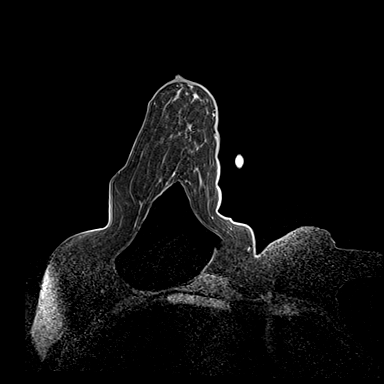
[im 144/144]
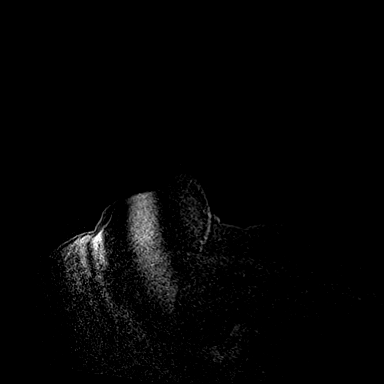

[Series 4: axial pre-cm_s3_nd · axial · non-contrast · 1.3mm · 0.73mm/px · z∈[-69,+117]mm · 3 of 144 slices shown]
[im 1/144]
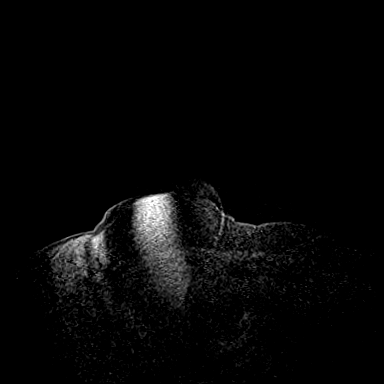
[im 72/144]
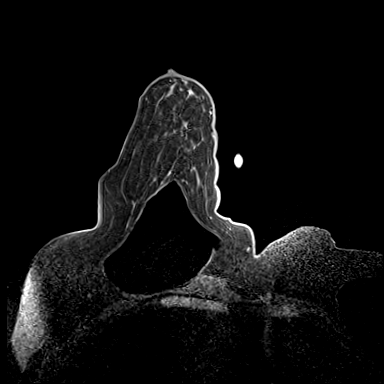
[im 144/144]
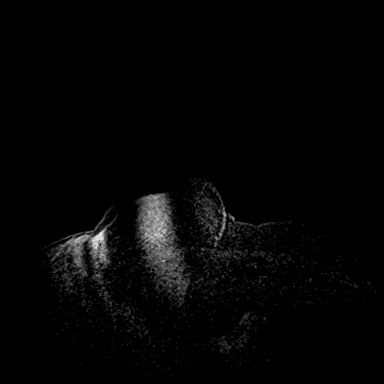

[Series 5: axial pre-cm_s3_nd_s4_(id) · axial · non-contrast · 1.3mm · 0.73mm/px · z∈[-69,+117]mm · 3 of 144 slices shown]
[im 1/144]
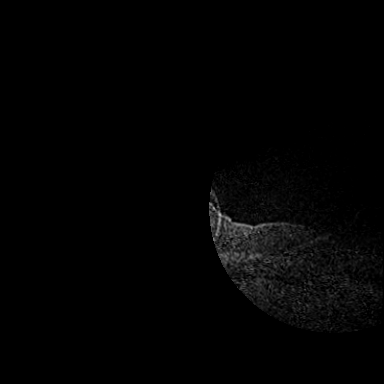
[im 72/144]
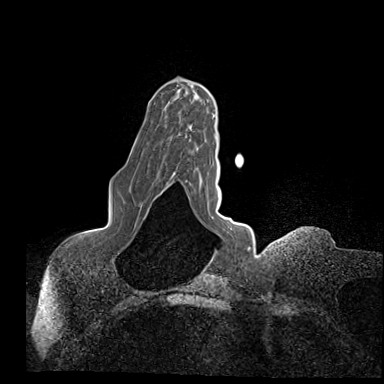
[im 144/144]
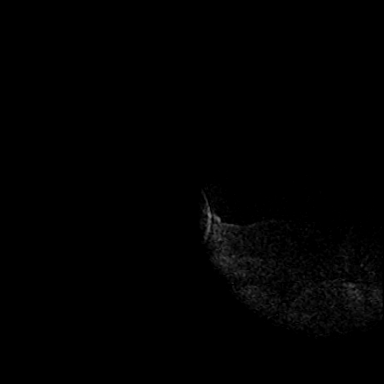

[Series 6: axial post 20 · axial · 1.3mm · 0.73mm/px · z∈[-69,+117]mm · 3 of 144 slices shown (1 of 4)]
[im 1/144]
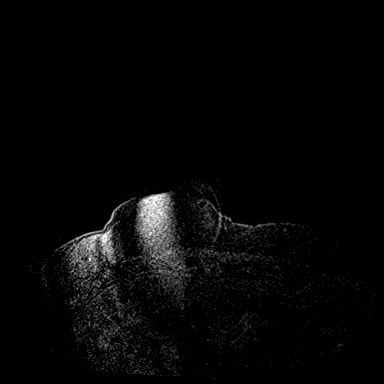
[im 72/144]
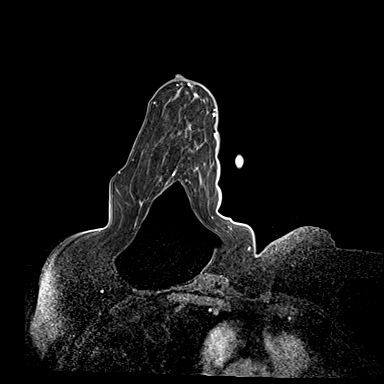
[im 144/144]
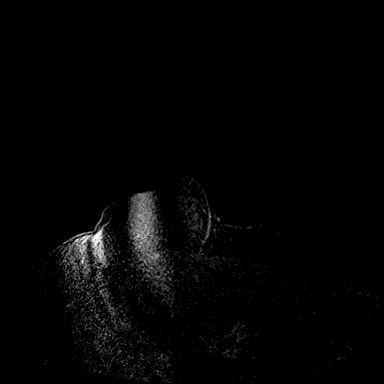

[Series 7: axial post 20 · axial · 1.3mm · 0.73mm/px · z∈[-69,+117]mm · 4 of 144 slices shown (2 of 4)]
[im 1/144]
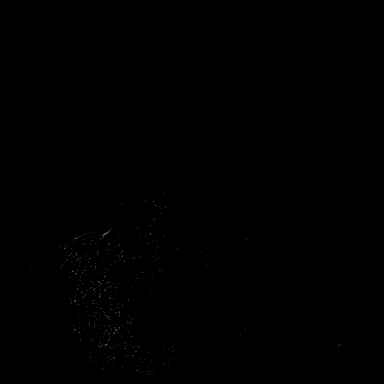
[im 48/144]
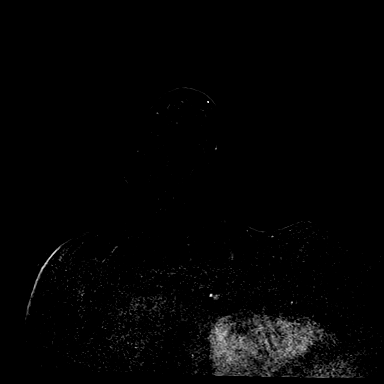
[im 96/144]
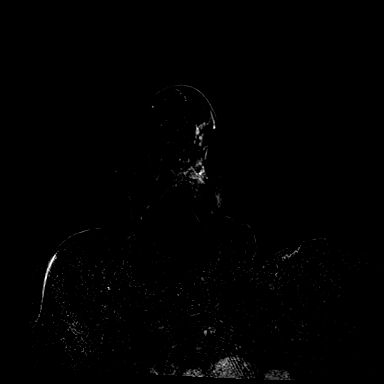
[im 144/144]
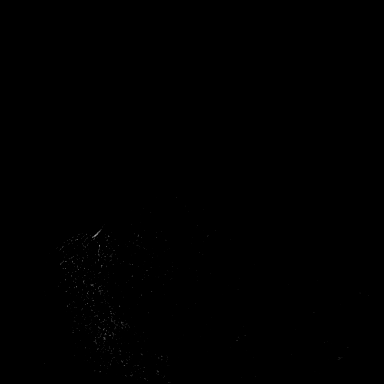

[Series 8: axial post 20 · axial · 1.3mm · 0.73mm/px · z∈[-69,+117]mm · 4 of 144 slices shown (3 of 4)]
[im 1/144]
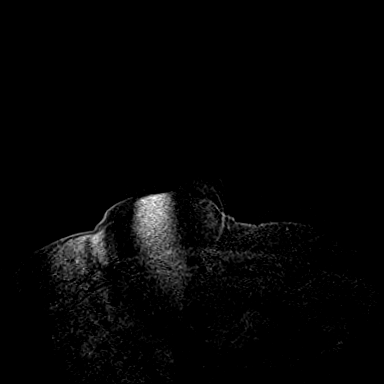
[im 48/144]
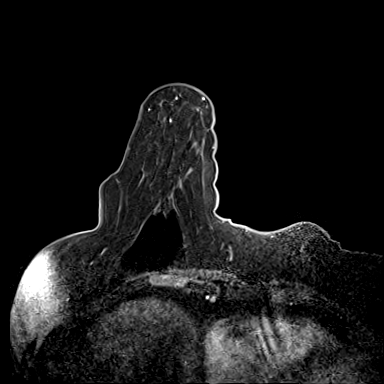
[im 96/144]
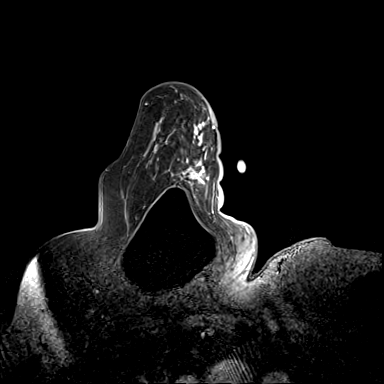
[im 144/144]
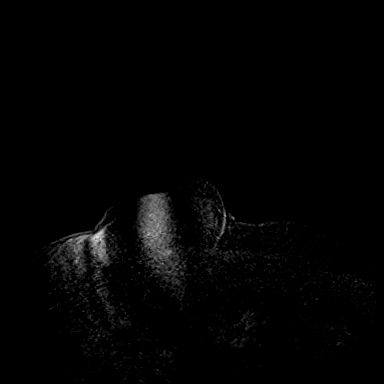

[Series 9: axial post 20 · axial · 1.3mm · 0.73mm/px · z∈[-69,+117]mm · 4 of 144 slices shown (4 of 4)]
[im 1/144]
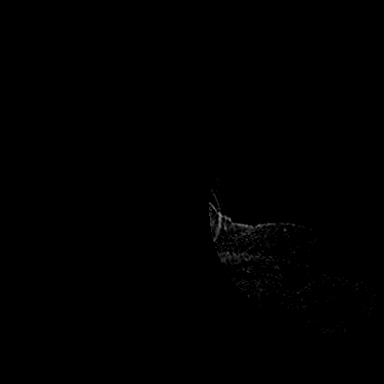
[im 48/144]
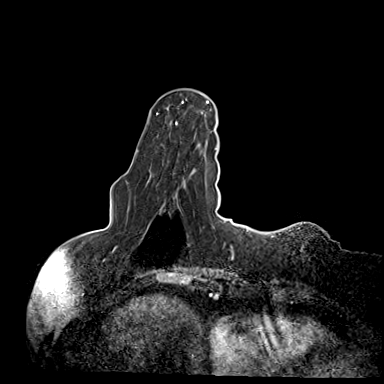
[im 96/144]
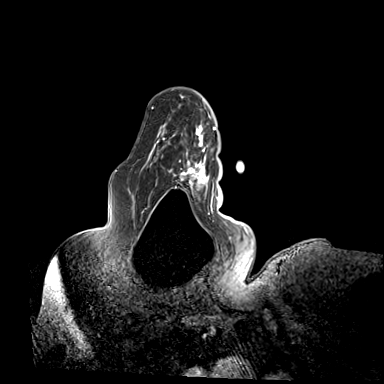
[im 144/144]
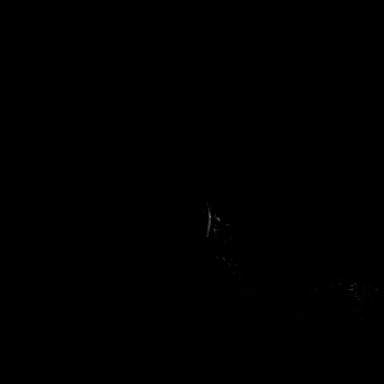

[Series 10: axial confirmation · axial · 1.3mm · 0.73mm/px · z∈[-69,+117]mm · 4 of 144 slices shown (1 of 2)]
[im 1/144]
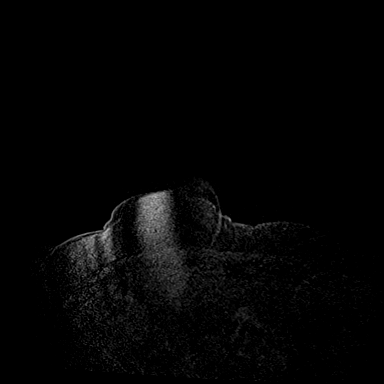
[im 48/144]
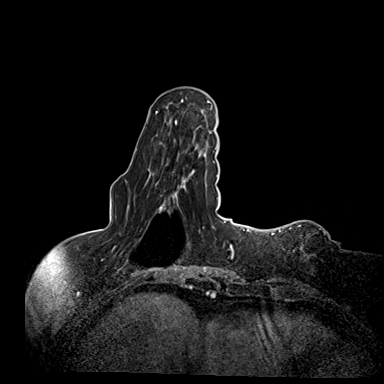
[im 96/144]
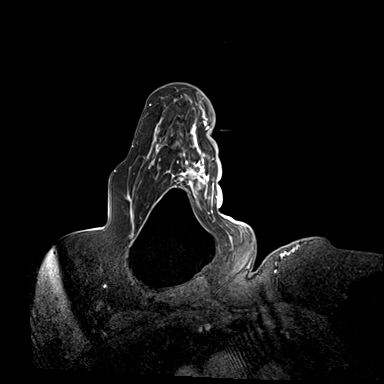
[im 144/144]
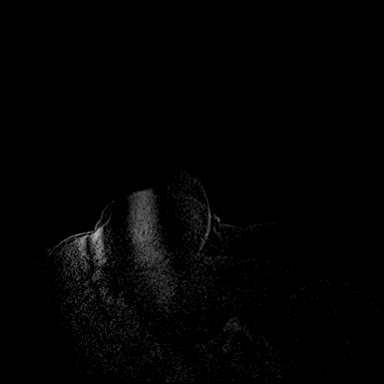

[Series 11: axial confirmation_sub · axial · 1.3mm · 0.73mm/px · z∈[-69,+117]mm · 4 of 144 slices shown]
[im 1/144]
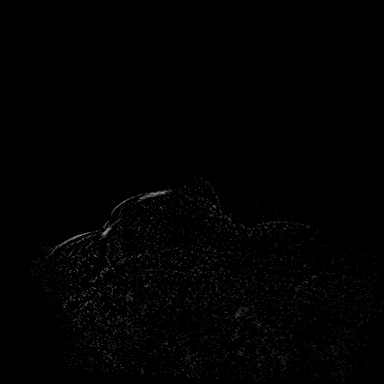
[im 48/144]
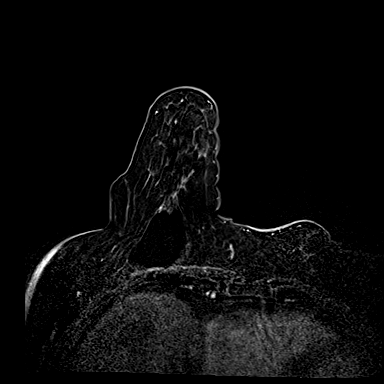
[im 96/144]
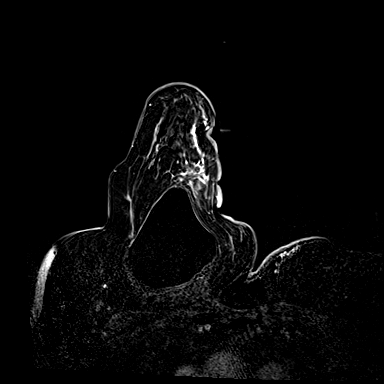
[im 144/144]
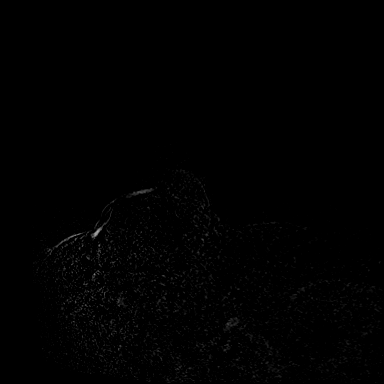

[Series 12: axial confirmation · axial · 1.3mm · 0.73mm/px · 1 of 144 slices shown (2 of 2)]
[im 1/144]
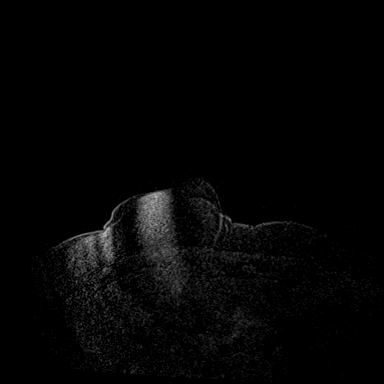

[33 of 48 positions shown; findings below may reference images not displayed]

FINDINGS: I met with the patient, and we discussed the procedure of MRI guided
biopsy, including risks, benefits, and alternatives. Specifically,
we discussed the risks of infection, bleeding, tissue injury, clip
migration, and inadequate sampling. Informed, written consent was
given. The usual time out protocol was performed immediately prior
to the procedure.

Using sterile technique, 1% Lidocaine, MRI guidance, and a 9 gauge
vacuum assisted device, biopsy was performed of linear enhancement
in the upper inner quadrant of the right breast using a medial
approach. At the conclusion of the procedure, a tissue marker clip
was deployed into the biopsy cavity. Follow-up 2-view mammogram was
performed and dictated separately.
IMPRESSION: MRI guided biopsy of the right breast. No apparent complications.

## 2017-07-21 IMAGING — DX DG CHEST 1V
1 series · 1 of 1 positions shown · non-contrast
Comparison: 09/03/2011

CLINICAL DATA: Postop Port-A-Cath placement.

EXAM:
CHEST 1 VIEW

[chest ap]
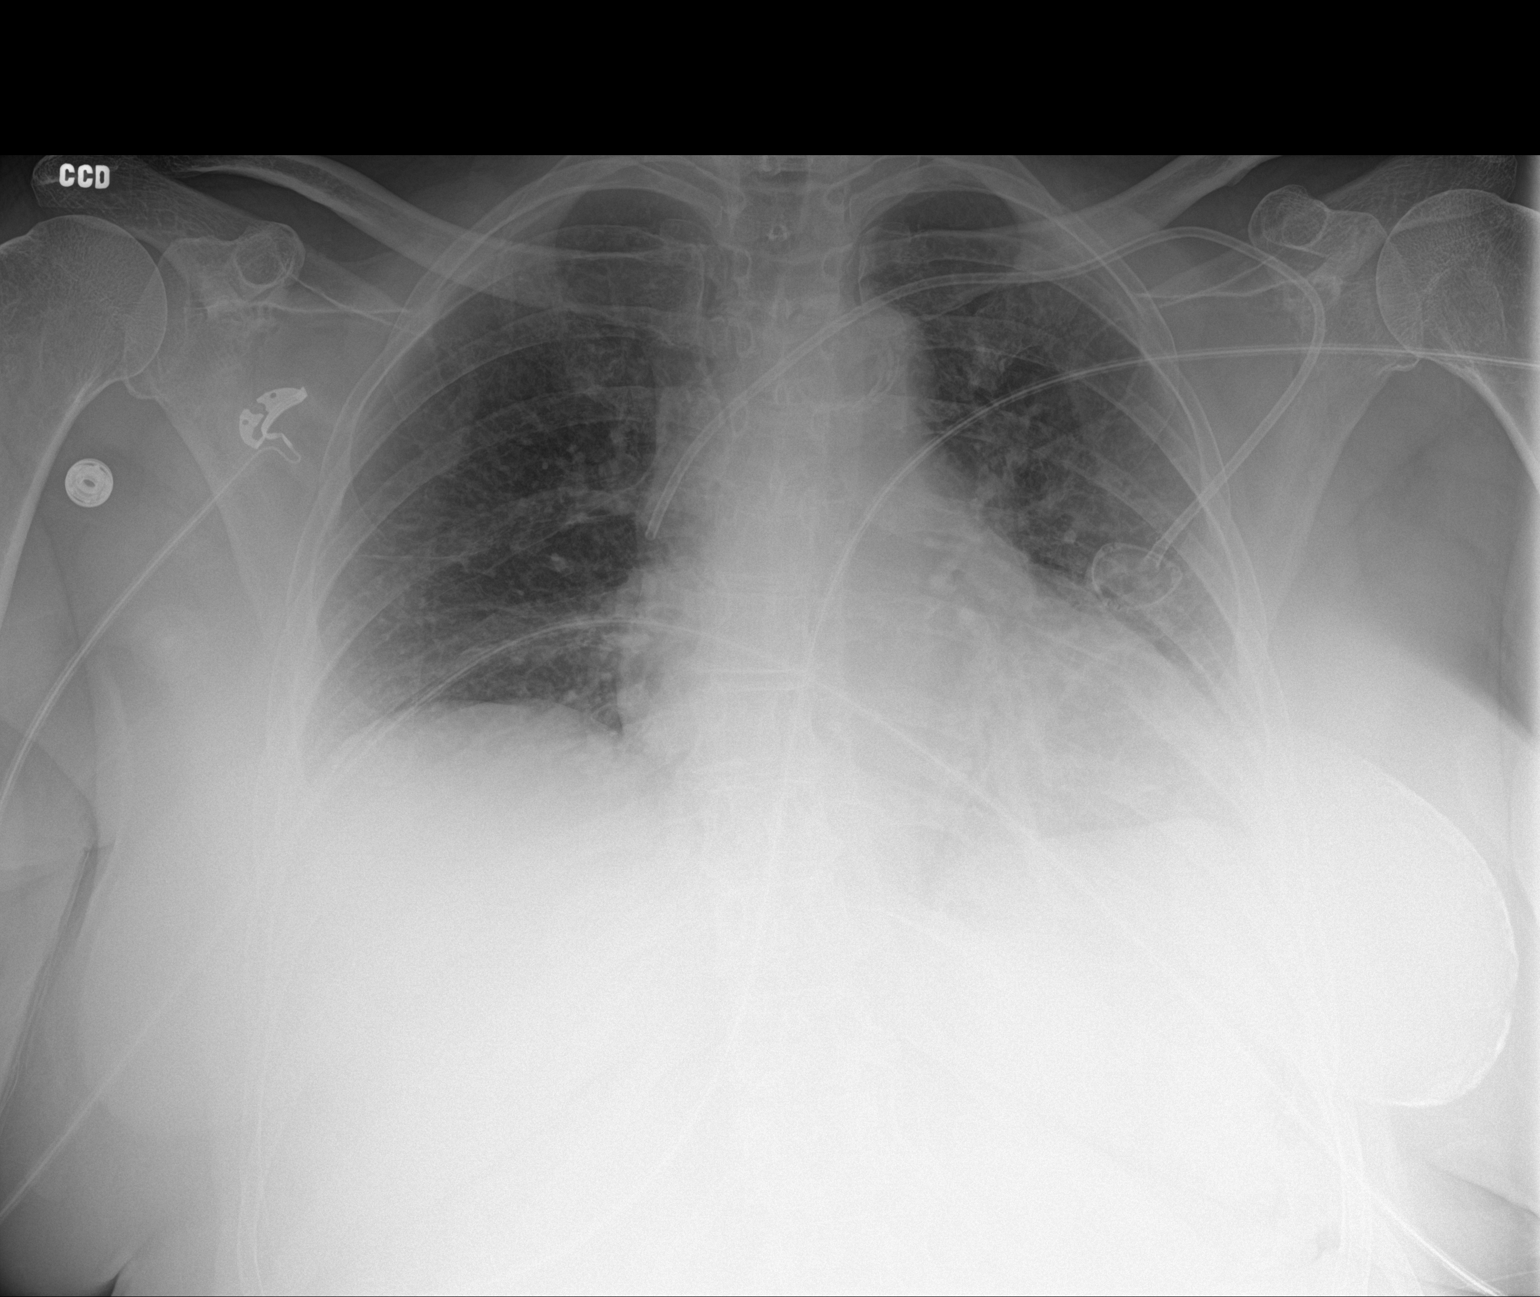

[1 of 1 positions shown; findings below may reference images not displayed]

FINDINGS: Left Port-A-Cath is in place with the tip in the SVC. No
pneumothorax. Low lung volumes with bibasilar atelectasis and
accentuation of heart size. No effusions or acute bony abnormality.
IMPRESSION: Left Port-A-Cath placement with the tip in the SVC. No pneumothorax.

Bibasilar atelectasis.

## 2017-07-26 ENCOUNTER — Encounter: Payer: Self-pay | Admitting: *Deleted

## 2017-07-26 DIAGNOSIS — Z171 Estrogen receptor negative status [ER-]: Principal | ICD-10-CM

## 2017-07-26 DIAGNOSIS — C50211 Malignant neoplasm of upper-inner quadrant of right female breast: Secondary | ICD-10-CM

## 2017-07-26 NOTE — Progress Notes (Signed)
Called patient to ask if she would be willing to complete questionnaires for Week 13 on the [place] which are due at this time point.  Patient states she is willing to complete questionnaires but is unwilling to come into the clinic before her next scheduled appointment in April. Offered to either mail questionnaires to patient or complete over the phone with her and patient opted to have them mailed.  Informed patient I will mail the questionnaires to her home along with a return addressed pre paid envelope to mail them back to research nurse. Thanked patient very much for taking the time to complete these questionnaires at home.  Reminded her of next appointment on 09/06/17.  Asked her to call research nurse if any problems or questions before our next contact. She verbalized understanding.  Placed Questionnaires form QOL and [facility] in outgoing mail to patient's home address along with return paid envelope this morning.  Foye Spurling, BSN, RN Clinical Research Nurse 07/26/2017 2:26 PM

## 2017-07-27 ENCOUNTER — Encounter: Payer: Self-pay | Admitting: Obstetrics & Gynecology

## 2017-08-10 DIAGNOSIS — H2511 Age-related nuclear cataract, right eye: Secondary | ICD-10-CM | POA: Diagnosis not present

## 2017-08-11 DIAGNOSIS — H2512 Age-related nuclear cataract, left eye: Secondary | ICD-10-CM | POA: Diagnosis not present

## 2017-08-17 ENCOUNTER — Encounter: Payer: Self-pay | Admitting: *Deleted

## 2017-08-17 DIAGNOSIS — C50211 Malignant neoplasm of upper-inner quadrant of right female breast: Secondary | ICD-10-CM

## 2017-08-17 DIAGNOSIS — Z171 Estrogen receptor negative status [ER-]: Principal | ICD-10-CM

## 2017-08-17 NOTE — Progress Notes (Signed)
Called patient to follow up on [place] for Week 13.   Patient says she did receive them in the mail but has not had a chance to do them yet.  She will try to work on them this weekend and mail them back into Nutritional therapist.  Thanked patient for her time and willingness to complete this study activity.   Foye Spurling, BSN, RN Clinical Research Nurse 08/17/2017 11:19 AM

## 2017-08-28 DIAGNOSIS — E1169 Type 2 diabetes mellitus with other specified complication: Secondary | ICD-10-CM | POA: Diagnosis not present

## 2017-08-28 DIAGNOSIS — E1143 Type 2 diabetes mellitus with diabetic autonomic (poly)neuropathy: Secondary | ICD-10-CM | POA: Diagnosis not present

## 2017-08-28 DIAGNOSIS — E785 Hyperlipidemia, unspecified: Secondary | ICD-10-CM | POA: Diagnosis not present

## 2017-08-28 DIAGNOSIS — E039 Hypothyroidism, unspecified: Secondary | ICD-10-CM | POA: Diagnosis not present

## 2017-08-31 DIAGNOSIS — H2512 Age-related nuclear cataract, left eye: Secondary | ICD-10-CM | POA: Diagnosis not present

## 2017-09-05 ENCOUNTER — Telehealth: Payer: Self-pay | Admitting: *Deleted

## 2017-09-05 ENCOUNTER — Other Ambulatory Visit: Payer: Self-pay | Admitting: *Deleted

## 2017-09-05 DIAGNOSIS — Z171 Estrogen receptor negative status [ER-]: Principal | ICD-10-CM

## 2017-09-05 DIAGNOSIS — C50211 Malignant neoplasm of upper-inner quadrant of right female breast: Secondary | ICD-10-CM

## 2017-09-05 NOTE — Telephone Encounter (Signed)
Left Vm for patient to remind her of appointments scheduled for tomorrow.  Asked her to return call if any questions or if she is unable to make this appointment.  Foye Spurling, BSN, RN Clinical Research Nurse 09/05/2017 10:46 AM

## 2017-09-06 ENCOUNTER — Inpatient Hospital Stay (HOSPITAL_BASED_OUTPATIENT_CLINIC_OR_DEPARTMENT_OTHER): Payer: Medicare Other | Admitting: Hematology and Oncology

## 2017-09-06 ENCOUNTER — Inpatient Hospital Stay: Payer: Medicare Other | Attending: Radiation Oncology

## 2017-09-06 ENCOUNTER — Encounter: Payer: Self-pay | Admitting: *Deleted

## 2017-09-06 DIAGNOSIS — Z923 Personal history of irradiation: Secondary | ICD-10-CM

## 2017-09-06 DIAGNOSIS — Z171 Estrogen receptor negative status [ER-]: Principal | ICD-10-CM

## 2017-09-06 DIAGNOSIS — Z006 Encounter for examination for normal comparison and control in clinical research program: Secondary | ICD-10-CM | POA: Diagnosis not present

## 2017-09-06 DIAGNOSIS — C50211 Malignant neoplasm of upper-inner quadrant of right female breast: Secondary | ICD-10-CM

## 2017-09-06 DIAGNOSIS — Z9221 Personal history of antineoplastic chemotherapy: Secondary | ICD-10-CM | POA: Insufficient documentation

## 2017-09-06 LAB — CMP (CANCER CENTER ONLY)
ALT: 48 U/L (ref 0–55)
AST: 62 U/L — AB (ref 5–34)
Albumin: 3.7 g/dL (ref 3.5–5.0)
Alkaline Phosphatase: 73 U/L (ref 40–150)
Anion gap: 7 (ref 3–11)
BILIRUBIN TOTAL: 0.9 mg/dL (ref 0.2–1.2)
BUN: 19 mg/dL (ref 7–26)
CO2: 26 mmol/L (ref 22–29)
CREATININE: 0.83 mg/dL (ref 0.60–1.10)
Calcium: 9.3 mg/dL (ref 8.4–10.4)
Chloride: 102 mmol/L (ref 98–109)
GFR, Est AFR Am: >60 mL/min (ref >60)
Glucose, Bld: 177 mg/dL — ABNORMAL HIGH (ref 70–140)
POTASSIUM: 4.3 mmol/L (ref 3.5–5.1)
Sodium: 135 mmol/L — ABNORMAL LOW (ref 136–145)
TOTAL PROTEIN: 6.7 g/dL (ref 6.4–8.3)

## 2017-09-06 LAB — CBC WITH DIFFERENTIAL (CANCER CENTER ONLY)
BASOS ABS: 0 10*3/uL (ref 0.0–0.1)
BASOS PCT: 1 %
EOS ABS: 0.1 10*3/uL (ref 0.0–0.5)
Eosinophils Relative: 2 %
HCT: 38.5 % (ref 34.8–46.6)
Hemoglobin: 12.8 g/dL (ref 11.6–15.9)
Lymphocytes Relative: 23 %
Lymphs Abs: 1 10*3/uL (ref 0.9–3.3)
MCH: 32.6 pg (ref 25.1–34.0)
MCHC: 33.3 g/dL (ref 31.5–36.0)
MCV: 98.1 fL (ref 79.5–101.0)
Monocytes Absolute: 0.4 10*3/uL (ref 0.1–0.9)
Monocytes Relative: 9 %
Neutro Abs: 2.7 10*3/uL (ref 1.5–6.5)
Neutrophils Relative %: 65 %
PLATELETS: 143 10*3/uL — AB (ref 145–400)
RBC: 3.92 MIL/uL (ref 3.70–5.45)
RDW: 13.9 % (ref 11.2–14.5)
WBC: 4.2 10*3/uL (ref 3.9–10.3)

## 2017-09-06 NOTE — Progress Notes (Signed)
E3212 Cycle 3 visit for Off Early Cycles; Patient into clinic by herself today for study visit including Lab and MD visit.  Met patient in lobby after she registered and accompanied her to lab.  She said she was nervous to have her blood drawn peripherally since she has had difficulty in the past.  She had her PAC removed in February.  Her labs were drawn on one stick and without difficulty.  Patient tolerated well.  Accompanied patient on her visit with Dr. Lindi Adie.  He examined patient and assessed her toxicities.  Patient denies any worsening adverse events on today's visit. She states her back pain is about the same and does not limit her from doing her usual activities.  She states mild fatigue is ongoing and neither better or worse since last visit.  It is worse in the morning. She continues to work part time and travels occasionally.  She states her numbness and tingling in her feet bother her sometimes, but she also states this has not gotten any worse and she continues to take the Cymbalta as directed.  She says the neuropathy does not interfere with her function or ability to walk.  She had cataract surgery recently and states her vision has improved greatly.  Patient reports she was diagnosed with Cataracts in February this year and had surgery on right eye 08/10/17 and then left eye 08/31/17.  She does report some intermittent mild to moderate headaches which started 2 weeks ago and she feels might be related to "sinuses" and "allergies".  She has been taking ibuprofen as needed for the headaches.  She denies any other new symptoms.  She agrees to return in 12 weeks for next study visit with lab and MD.  Patient returned completed questionnaires that research nurse mailed to her on 07/26/17.  States she just completed them this past weekend on 3/30 and 3/31 due to being busy and stressed. These were for the week [place] which was due within 2 weeks of 07/26/17.  This is outside the window for collection of  PROs. Will marked as missed on electronic data capture form for [facility] sub-study.   Patient signed the Withdrawal of Treatment Consent form IRB approved 05/15/17.  She agrees to continue on "observation" and allow study to collect her information.  Thanked patient very much for her ongoing participation in this clinical trial.  Asked her to call research nurse if any questions or problems prior to next appointment on 11/29/17.   New or Worsening AEs for Cycle 2; 06/14/17-09/06/17  Event Grade Onset Date End Date Status Comment Attribution to Pembrolizumab Immune Related Radiation Related  Back Pain 1 05/30/17  ongoing Intermittent prior to starting on study Unlikely    No    No  Hypertension 3 06/14/17 09/06/17 decreased to grade 2 Grade 2 at baseline. Unlikely    No     No  Fatigue  1 06/14/17  ongoing At baseline, resolved, returned Unlikey    No     No  Cataracts, bilateral 3 07/2017 08/31/17 resolved Cataracts removed Unlikely    No     No  Headaches 2 08/23/17  Ongoing, intermittent  Unlikely    No      No   Foye Spurling, BSN, RN Clinical Research Nurse 09/06/2017 4:32 PM

## 2017-09-06 NOTE — Assessment & Plan Note (Signed)
02/29/2016: Right breast palpable mass (with silicone implants 7127), 3.5 cm on MRI, additional 3 cm anterior linear enhancement (biopsy 03/14/2016 IDC grade 3); 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  T2 N0 stage 2A clinical stage  Treatment summary: 1. Neoadjuvant chemotherapy with dose dense Adriamycin and Cytoxan 4 followed by Abraxane weekly 5(stopped early due to neuropathy) 2. 09/13/2016: Right lumpectomy: IDC grade 3, 2 foci, 2 cm and 1.1 cm, 0/3 lymph nodes negative margins negative, ER 0%, PR 0%, HER-2 negative ratio 1.02, Ki-67 60%, RCB-II; ypT2ypN0 Stage 2A (with plastic surgery removing the ruptured implant) 3. Followed by radiation therapy completed 12/06/2016 4.Adjuvant Xeloda 1000 mg by mouth twice a day 2 weeks on one week off 12/30/2016-Oct 2018 5. SWOG 1418 clinical trial Pembrolizumab cycle 1 given 05/03/2017 (patient decided to discontinue clinical trial) ----------------------------------------------------------------------------------------------------------------------------------------- Current treatment:  SWOG 1418 clinical trial Pembrolizumab versus observation;patient was randomized to Pembrolizumab She decided to stop therapy due to loss of interest rather than toxicities.  Surveillance: 1.  Breast exam 09/06/2017 2. mammogram 03/24/2017 at [facility]: No evidence of malignancy.  Breast density category 8

## 2017-09-06 NOTE — Progress Notes (Signed)
Patient Care Team: Tammi Sou, MD as PCP - General (Family Medicine) Alphonsa Overall, MD as Consulting Physician (General Surgery) Nicholas Lose, MD as Consulting Physician (Hematology and Oncology) Kyung Rudd, MD as Consulting Physician (Radiation Oncology) Irene Limbo, MD as Consulting Physician (Plastic Surgery) Gerome Apley, MD as Consulting Physician (Endocrinology) Delice Bison Charlestine Massed, NP as Nurse Practitioner (Hematology and Oncology)  DIAGNOSIS:  Encounter Diagnosis  Name Primary?  . Malignant neoplasm of upper-inner quadrant of right breast in female, estrogen receptor negative ([place])     SUMMARY OF ONCOLOGIC HISTORY:   Breast cancer of upper-inner quadrant of right female breast ([place])   02/29/2016 Initial Diagnosis    Right breast palpable mass (with silicone implants 9678), 3.5 cm on MRI, additional 3 cm anterior linear enhancement? 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 Ki-67 60%, T2 N0 stage 2A clinical stage      03/14/2016 Procedure    Right breast biopsy upper inner quadrant: IDC grade 3      03/28/2016 -  Neo-Adjuvant Chemotherapy    Neoadjuvant chemotherapy with dose dense Adriamycin and Cytoxan followed by Abraxane weekly 5 ( patient is diabetic and cannot take steroids)      07/15/2016 Breast MRI    Right breast: Spiculated mass 1.5 cm significantly smaller compared to prior,NME previously seen is not noted, no abnormal lymph nodes      09/13/2016 Surgery    Removal of the silicone implant due to intracapsular rupture and capsulectomy (Dr.Thimappa)      09/13/2016 Surgery    Right lumpectomy: IDC grade 3, 2 foci, 2 cm and 1.1 cm, 0/3 lymph nodes negative margins negative, ER 0%, PR 0%, HER-2 negative ratio 1.02, Ki-67 60%, RCB-II; ypT2ypN0 Stage 2A       10/20/2016 - 12/06/2016 Radiation Therapy    Adjuvant radiation therapy      12/30/2016 - 04/05/2017 Chemotherapy    Xeloda 1000 mg by mouth twice a day adjuvant therapy x 4  cycles       05/03/2017 - 05/24/2017 Chemotherapy    SWOG S 1418 Pembrolizumab on clinical trial stopped after 2 doses by patient preference (not due to toxicities .)       CHIEF COMPLIANT: Follow-up after stopping pembrolizumab  INTERVAL HISTORY: Marissa Hall is a 77 year old with above-mentioned history of right breast cancer treated with neoadjuvant chemotherapy followed by lumpectomy and radiation.  Because she had residual disease she received adjuvant Xeloda and then randomized on pembrolizumab immunotherapy.  She decided to stop the pembrolizumab and come off the clinical trial.  She reports no major problems or concerns currently.  Denies any fevers chills night sweats or weight loss.  REVIEW OF SYSTEMS:   Constitutional: Denies fevers, chills or abnormal weight loss Eyes: Denies blurriness of vision Ears, nose, mouth, throat, and face: Denies mucositis or sore throat Respiratory: Denies cough, dyspnea or wheezes Cardiovascular: Denies palpitation, chest discomfort Gastrointestinal:  Denies nausea, heartburn or change in bowel habits Skin: Denies abnormal skin rashes Lymphatics: Denies new lymphadenopathy or easy bruising Neurological:Denies numbness, tingling or new weaknesses Behavioral/Psych: Mood is stable, no new changes  Extremities: No lower extremity edema  All other systems were reviewed with the patient and are negative.  I have reviewed the past medical history, past surgical history, social history and family history with the patient and they are unchanged from previous note.  ALLERGIES:  is allergic to other and penicillins.  MEDICATIONS:  Current Outpatient Medications  Medication Sig Dispense Refill  .  atorvastatin (LIPITOR) 80 MG tablet Take 80 mg by mouth every evening.     [name] Kitchen azelastine (ASTELIN) 0.1 % nasal spray Place 2 sprays into both nostrils daily as needed for rhinitis or allergies. Use in each nostril as directed    . DULoxetine (CYMBALTA) 30  MG capsule Take 1 capsule (30 mg total) by mouth daily. 90 capsule 1  . levothyroxine (SYNTHROID, LEVOTHROID) 112 MCG tablet Take 1 tablet by mouth daily.    [name] Kitchen lidocaine-prilocaine (EMLA) cream Apply 1 application topically as needed. 30 g 1  . lisinopril (PRINIVIL,ZESTRIL) 20 MG tablet Take 20 mg by mouth every evening.     . magnesium oxide (MAG-OX) 400 (241.3 Mg) MG tablet Take 1.5 tablets (600 mg total) by mouth daily. (Patient taking differently: Take 600 mg as needed by mouth (Leg Cramps). ) 30 tablet 3  . metFORMIN (GLUCOPHAGE-XR) 500 MG 24 hr tablet Take 1,000 mg by mouth daily with supper.     [name] Kitchen omeprazole (PRILOSEC) 40 MG capsule Take 1 capsule (40 mg total) by mouth daily. (Patient taking differently: Take 40 mg as needed by mouth (Heart Burn). ) 30 capsule 2  . pyridoxine (B-6) 100 MG tablet Take 100 mg by mouth daily.     . traZODone (DESYREL) 50 MG tablet 1-3 tabs po qhs prn anxiety (Patient taking differently: Take 25 mg at bedtime as needed by mouth for sleep (1/2 tablet as needed for sleep). 1-3 tabs po qhs prn anxiety) 60 tablet 3  . Vitamin D, Ergocalciferol, (DRISDOL) 50000 units CAPS capsule Take 50,000 Units by mouth every 7 (seven) days. Saturdays     No current facility-administered medications for this visit.     PHYSICAL EXAMINATION: ECOG PERFORMANCE STATUS: 1 - Symptomatic but completely ambulatory  Vitals:   09/06/17 1505  BP: (!) 156/66  Pulse: 65  Resp: 18  Temp: 98.7 F (37.1 C)  SpO2: 97%   Filed Weights   09/06/17 1505  Weight: 175 lb 11.2 oz (79.7 kg)    GENERAL:alert, no distress and comfortable SKIN: skin color, texture, turgor are normal, no rashes or significant lesions EYES: normal, Conjunctiva are pink and non-injected, sclera clear OROPHARYNX:no exudate, no erythema and lips, buccal mucosa, and tongue normal  NECK: supple, thyroid normal size, non-tender, without nodularity LYMPH:  no palpable lymphadenopathy in the cervical, axillary or  inguinal LUNGS: clear to auscultation and percussion with normal breathing effort HEART: regular rate & rhythm and no murmurs and no lower extremity edema ABDOMEN:abdomen soft, non-tender and normal bowel sounds MUSCULOSKELETAL:no cyanosis of digits and no clubbing  NEURO: alert & oriented x 3 with fluent speech, no focal motor/sensory deficits EXTREMITIES: No lower extremity edema BREAST: No palpable masses or nodules in either right or left breasts. No palpable axillary supraclavicular or infraclavicular adenopathy no breast tenderness or nipple discharge. (exam performed in the presence of a chaperone)  LABORATORY DATA:  I have reviewed the data as listed CMP Latest Ref Rng & Units 09/06/2017 06/14/2017 05/01/2017  Glucose 70 - 140 mg/dL 177(H) 181(H) 156(H)  BUN 7 - 26 mg/dL 19 15 14.9  Creatinine 0.60 - 1.10 mg/dL 0.83 0.76 0.8  Sodium 136 - 145 mmol/L 135(L) 137 136  Potassium 3.5 - 5.1 mmol/L 4.3 4.2 4.5  Chloride 98 - 109 mmol/L 102 105 -  CO2 22 - 29 mmol/L '26 24 24  ' Calcium 8.4 - 10.4 mg/dL 9.3 9.1 9.6  Total Protein 6.4 - 8.3 g/dL 6.7 6.9 6.9  Total Bilirubin 0.2 -  1.2 mg/dL 0.9 1.1 1.30(H)  Alkaline Phos 40 - 150 U/L 73 71 75  AST 5 - 34 U/L 62(H) 44(H) 51(H)  ALT 0 - 55 U/L 48 52 62(H)    Lab Results  Component Value Date   WBC 4.2 09/06/2017   HGB 12.9 06/14/2017   HCT 38.5 09/06/2017   MCV 98.1 09/06/2017   PLT 143 (L) 09/06/2017   NEUTROABS 2.7 09/06/2017    ASSESSMENT & PLAN:  Breast cancer of upper-inner quadrant of right female breast ([place]) 02/29/2016: Right breast palpable mass (with silicone implants 6015), 3.5 cm on MRI, additional 3 cm anterior linear enhancement (biopsy 03/14/2016 IDC grade 3); 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  T2 N0 stage 2A clinical stage  Treatment summary: 1. Neoadjuvant chemotherapy with dose dense Adriamycin and Cytoxan 4 followed by Abraxane weekly 5(stopped early due to neuropathy) 2. 09/13/2016: Right lumpectomy:  IDC grade 3, 2 foci, 2 cm and 1.1 cm, 0/3 lymph nodes negative margins negative, ER 0%, PR 0%, HER-2 negative ratio 1.02, Ki-67 60%, RCB-II; ypT2ypN0 Stage 2A (with plastic surgery removing the ruptured implant) 3. Followed by radiation therapy completed 12/06/2016 4.Adjuvant Xeloda 1000 mg by mouth twice a day 2 weeks on one week off 12/30/2016-Oct 2018 5. SWOG 1418 clinical trial Pembrolizumab cycle 1 given 05/03/2017 (patient decided to discontinue clinical trial) ----------------------------------------------------------------------------------------------------------------------------------------- Current treatment:  SWOG 1418 clinical trial Pembrolizumab versus observation;patient was randomized to Pembrolizumab She decided to stop therapy due to loss of interest rather than toxicities.  Surveillance: 1.  Breast exam 09/06/2017 2. mammogram [facility]/24/2017 at [facility]: No evidence of malignancy.  Breast density category 8 Return to clinic in 6 months for follow-up. No orders of the defined types were placed in this encounter.  The patient has a good understanding of the overall plan. she agrees with it. she will call with any problems that may develop before the next visit here.   Harriette Ohara, MD 09/06/17

## 2017-09-11 ENCOUNTER — Encounter: Payer: Self-pay | Admitting: *Deleted

## 2017-09-12 ENCOUNTER — Encounter: Payer: Self-pay | Admitting: *Deleted

## 2017-09-12 NOTE — Progress Notes (Signed)
Addendum to Research Note/ AE Table 09/06/17-  Correction to Attribution to Pembrolizumab is "Unrelated" and not "Unlikely" as previously documented.  New or WorseningAEsfor Cycle 2; 06/14/17-09/06/17  Event Grade Onset Date End Date Status Comment Attribution toPembrolizumab Immune Related Radiation Related  Back Pain 1 05/30/17  ongoing Intermittent prior to starting on study Unrelated    No    No  Hypertension 3 06/14/17 09/06/17 decreased to grade 2 Grade 2 at baseline. Unrelated    No     No  Fatigue  1 06/14/17  ongoing At baseline, resolved, returned Unrelated    No     No  Cataracts, bilateral 3 07/2017 08/31/17 resolved Cataracts removed Unrelated    No     No  Headaches 2 08/23/17  Ongoing, intermittent  Unrelated    No      No   Foye Spurling, BSN, RN Clinical Research Nurse 09/12/17

## 2017-10-10 DIAGNOSIS — E785 Hyperlipidemia, unspecified: Secondary | ICD-10-CM | POA: Diagnosis not present

## 2017-10-10 DIAGNOSIS — E1140 Type 2 diabetes mellitus with diabetic neuropathy, unspecified: Secondary | ICD-10-CM | POA: Diagnosis not present

## 2017-10-10 DIAGNOSIS — I10 Essential (primary) hypertension: Secondary | ICD-10-CM | POA: Diagnosis not present

## 2017-10-10 DIAGNOSIS — E039 Hypothyroidism, unspecified: Secondary | ICD-10-CM | POA: Diagnosis not present

## 2017-10-10 DIAGNOSIS — E1165 Type 2 diabetes mellitus with hyperglycemia: Secondary | ICD-10-CM | POA: Diagnosis not present

## 2017-10-10 DIAGNOSIS — E1169 Type 2 diabetes mellitus with other specified complication: Secondary | ICD-10-CM | POA: Diagnosis not present

## 2017-10-10 DIAGNOSIS — E1159 Type 2 diabetes mellitus with other circulatory complications: Secondary | ICD-10-CM | POA: Diagnosis not present

## 2017-11-14 ENCOUNTER — Encounter: Payer: Self-pay | Admitting: Family Medicine

## 2017-11-28 ENCOUNTER — Other Ambulatory Visit: Payer: Self-pay | Admitting: *Deleted

## 2017-11-28 DIAGNOSIS — Z171 Estrogen receptor negative status [ER-]: Principal | ICD-10-CM

## 2017-11-28 DIAGNOSIS — C50211 Malignant neoplasm of upper-inner quadrant of right female breast: Secondary | ICD-10-CM

## 2017-11-29 ENCOUNTER — Inpatient Hospital Stay: Payer: Medicare Other | Admitting: Hematology and Oncology

## 2017-11-29 ENCOUNTER — Inpatient Hospital Stay: Payer: Medicare Other

## 2017-11-29 ENCOUNTER — Telehealth: Payer: Self-pay | Admitting: *Deleted

## 2017-11-29 ENCOUNTER — Inpatient Hospital Stay: Payer: Medicare Other | Attending: Radiation Oncology

## 2017-11-29 DIAGNOSIS — Z006 Encounter for examination for normal comparison and control in clinical research program: Secondary | ICD-10-CM | POA: Insufficient documentation

## 2017-11-29 DIAGNOSIS — Z923 Personal history of irradiation: Secondary | ICD-10-CM | POA: Insufficient documentation

## 2017-11-29 DIAGNOSIS — Z853 Personal history of malignant neoplasm of breast: Secondary | ICD-10-CM | POA: Insufficient documentation

## 2017-11-29 DIAGNOSIS — Z9221 Personal history of antineoplastic chemotherapy: Secondary | ICD-10-CM | POA: Insufficient documentation

## 2017-11-29 NOTE — Progress Notes (Deleted)
Patient Care Team: Tammi Sou, MD as PCP - General (Family Medicine) Alphonsa Overall, MD as Consulting Physician (General Surgery) Nicholas Lose, MD as Consulting Physician (Hematology and Oncology) Kyung Rudd, MD as Consulting Physician (Radiation Oncology) Irene Limbo, MD as Consulting Physician (Plastic Surgery) Gerome Apley, MD as Consulting Physician (Endocrinology) Delice Bison Charlestine Massed, NP as Nurse Practitioner (Hematology and Oncology)  DIAGNOSIS:  Encounter Diagnosis  Name Primary?  . Malignant neoplasm of upper-inner quadrant of right breast in female, estrogen receptor negative ([place])     SUMMARY OF ONCOLOGIC HISTORY:   Breast cancer of upper-inner quadrant of right female breast ([place])   02/29/2016 Initial Diagnosis    Right breast palpable mass (with silicone implants 6160), 3.5 cm on MRI, additional 3 cm anterior linear enhancement? 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 Ki-67 60%, T2 N0 stage 2A clinical stage      03/14/2016 Procedure    Right breast biopsy upper inner quadrant: IDC grade 3      03/28/2016 -  Neo-Adjuvant Chemotherapy    Neoadjuvant chemotherapy with dose dense Adriamycin and Cytoxan followed by Abraxane weekly 5 ( patient is diabetic and cannot take steroids)      07/15/2016 Breast MRI    Right breast: Spiculated mass 1.5 cm significantly smaller compared to prior,NME previously seen is not noted, no abnormal lymph nodes      09/13/2016 Surgery    Removal of the silicone implant due to intracapsular rupture and capsulectomy (Dr.Thimappa)      09/13/2016 Surgery    Right lumpectomy: IDC grade 3, 2 foci, 2 cm and 1.1 cm, 0/3 lymph nodes negative margins negative, ER 0%, PR 0%, HER-2 negative ratio 1.02, Ki-67 60%, RCB-II; ypT2ypN0 Stage 2A       10/20/2016 - 12/06/2016 Radiation Therapy    Adjuvant radiation therapy      12/30/2016 - 04/05/2017 Chemotherapy    Xeloda 1000 mg by mouth twice a day adjuvant therapy x 4  cycles       05/03/2017 - 05/24/2017 Chemotherapy    SWOG S 1418 Pembrolizumab on clinical trial stopped after 2 doses by patient preference (not due to toxicities .)       CHIEF COMPLIANT: Follow-up in surveillance of breast cancer on clinical trial  INTERVAL HISTORY: Marissa Hall is a 77 year old with above-mentioned history of right breast cancer treated with neoadjuvant chemotherapy followed by surgery radiation and took Xeloda for starting pembrolizumab immunotherapy clinical trial.  She did not continue the trial after 2 doses.  She appears to be doing extremely well.  REVIEW OF SYSTEMS:   Constitutional: Denies fevers, chills or abnormal weight loss Eyes: Denies blurriness of vision Ears, nose, mouth, throat, and face: Denies mucositis or sore throat Respiratory: Denies cough, dyspnea or wheezes Cardiovascular: Denies palpitation, chest discomfort Gastrointestinal:  Denies nausea, heartburn or change in bowel habits Skin: Denies abnormal skin rashes Lymphatics: Denies new lymphadenopathy or easy bruising Neurological:Denies numbness, tingling or new weaknesses Behavioral/Psych: Mood is stable, no new changes  Extremities: No lower extremity edema Breast:  denies any pain or lumps or nodules in either breasts All other systems were reviewed with the patient and are negative.  I have reviewed the past medical history, past surgical history, social history and family history with the patient and they are unchanged from previous note.  ALLERGIES:  is allergic to other and penicillins.  MEDICATIONS:  Current Outpatient Medications  Medication Sig Dispense Refill  . atorvastatin (LIPITOR) 80 MG tablet Take  80 mg by mouth every evening.     [name] Kitchen azelastine (ASTELIN) 0.1 % nasal spray Place 2 sprays into both nostrils daily as needed for rhinitis or allergies. Use in each nostril as directed    . DULoxetine (CYMBALTA) 30 MG capsule Take 1 capsule (30 mg total) by mouth daily. 90  capsule 1  . levothyroxine (SYNTHROID, LEVOTHROID) 112 MCG tablet Take 1 tablet by mouth daily.    [name] Kitchen lisinopril (PRINIVIL,ZESTRIL) 20 MG tablet Take 20 mg by mouth every evening.     . magnesium oxide (MAG-OX) 400 (241.3 Mg) MG tablet Take 1.5 tablets (600 mg total) by mouth daily. (Patient taking differently: Take 600 mg as needed by mouth (Leg Cramps). ) 30 tablet 3  . metFORMIN (GLUCOPHAGE-XR) 500 MG 24 hr tablet Take 1,000 mg by mouth daily with supper.     [name] Kitchen omeprazole (PRILOSEC) 40 MG capsule Take 1 capsule (40 mg total) by mouth daily. (Patient taking differently: Take 40 mg as needed by mouth (Heart Burn). ) 30 capsule 2  . pyridoxine (B-6) 100 MG tablet Take 100 mg by mouth daily.     . traZODone (DESYREL) 50 MG tablet 1-3 tabs po qhs prn anxiety (Patient taking differently: Take 25 mg at bedtime as needed by mouth for sleep (1/2 tablet as needed for sleep). 1-3 tabs po qhs prn anxiety) 60 tablet 3   No current facility-administered medications for this visit.     PHYSICAL EXAMINATION: ECOG PERFORMANCE STATUS: 1 - Symptomatic but completely ambulatory  There were no vitals filed for this visit. There were no vitals filed for this visit.  GENERAL:alert, no distress and comfortable SKIN: skin color, texture, turgor are normal, no rashes or significant lesions EYES: normal, Conjunctiva are pink and non-injected, sclera clear OROPHARYNX:no exudate, no erythema and lips, buccal mucosa, and tongue normal  NECK: supple, thyroid normal size, non-tender, without nodularity LYMPH:  no palpable lymphadenopathy in the cervical, axillary or inguinal LUNGS: clear to auscultation and percussion with normal breathing effort HEART: regular rate & rhythm and no murmurs and no lower extremity edema ABDOMEN:abdomen soft, non-tender and normal bowel sounds MUSCULOSKELETAL:no cyanosis of digits and no clubbing  NEURO: alert & oriented x 3 with fluent speech, no focal motor/sensory  deficits EXTREMITIES: No lower extremity edema BREAST: No palpable masses or nodules in either right or left breasts. No palpable axillary supraclavicular or infraclavicular adenopathy no breast tenderness or nipple discharge. (exam performed in the presence of a chaperone)  LABORATORY DATA:  I have reviewed the data as listed CMP Latest Ref Rng & Units 09/06/2017 06/14/2017 05/01/2017  Glucose 70 - 140 mg/dL 177(H) 181(H) 156(H)  BUN 7 - 26 mg/dL 19 15 14.9  Creatinine 0.60 - 1.10 mg/dL 0.83 0.76 0.8  Sodium 136 - 145 mmol/L 135(L) 137 136  Potassium 3.5 - 5.1 mmol/L 4.3 4.2 4.5  Chloride 98 - 109 mmol/L 102 105 -  CO2 22 - 29 mmol/L _0 Calcium 8.4 - 10.4 mg/dL 9.3 9.1 9.6  Total Protein 6.4 - 8.3 g/dL 6.7 6.9 6.9  Total Bilirubin 0.2 - 1.2 mg/dL 0.9 1.1 1.30(H)  Alkaline Phos 40 - 150 U/L 73 71 75  AST 5 - 34 U/L 62(H) 44(H) 51(H)  ALT 0 - 55 U/L 48 52 62(H)    Lab Results  Component Value Date   WBC 4.2 09/06/2017   HGB 12.8 09/06/2017   HCT 38.5 09/06/2017   MCV 98.1 09/06/2017   PLT 143 (L) 09/06/2017  NEUTROABS 2.7 09/06/2017    ASSESSMENT & PLAN:  Breast cancer of upper-inner quadrant of right female breast ([place]) 02/29/2016: Right breast palpable mass (with silicone implants 5427), 3.5 cm on MRI, additional 3 cm anterior linear enhancement (biopsy 03/14/2016 IDC grade 3); 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  T2 N0 stage 2A clinical stage  Treatment summary: 1. Neoadjuvant chemotherapy with dose dense Adriamycin and Cytoxan 4 followed by Abraxane weekly 5(stopped early due to neuropathy) 2. 09/13/2016: Right lumpectomy: IDC grade 3, 2 foci, 2 cm and 1.1 cm, 0/3 lymph nodes negative margins negative, ER 0%, PR 0%, HER-2 negative ratio 1.02, Ki-67 60%, RCB-II; ypT2ypN0 Stage 2A (with plastic surgery removing the ruptured implant) 3. Followed by radiation therapy completed 12/06/2016 4.Adjuvant Xeloda 1000 mg by mouth twice a day 2 weeks on one week off  12/30/2016-Oct 2018 5.SWOG 1418 clinical trial Pembrolizumabcycle 1 given 05/03/2017(patient decided to discontinue clinical trial) -----------------------------------------------------------------------------------------------------------------------------------------  Surveillance: 1.  Breast exam 09/06/2017: Benign 2. mammogram 03/24/2017 at [facility]: No evidence of malignancy.  Breast density category 8 Return to clinic in 1 year for follow-up.      No orders of the defined types were placed in this encounter.  The patient has a good understanding of the overall plan. she agrees with it. she will call with any problems that may develop before the next visit here.   Harriette Ohara, MD 11/29/17

## 2017-11-29 NOTE — Assessment & Plan Note (Deleted)
02/29/2016: Right breast palpable mass (with silicone implants 5300), 3.5 cm on MRI, additional 3 cm anterior linear enhancement (biopsy 03/14/2016 IDC grade 3); 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  T2 N0 stage 2A clinical stage  Treatment summary: 1. Neoadjuvant chemotherapy with dose dense Adriamycin and Cytoxan 4 followed by Abraxane weekly 5(stopped early due to neuropathy) 2. 09/13/2016: Right lumpectomy: IDC grade 3, 2 foci, 2 cm and 1.1 cm, 0/3 lymph nodes negative margins negative, ER 0%, PR 0%, HER-2 negative ratio 1.02, Ki-67 60%, RCB-II; ypT2ypN0 Stage 2A (with plastic surgery removing the ruptured implant) 3. Followed by radiation therapy completed 12/06/2016 4.Adjuvant Xeloda 1000 mg by mouth twice a day 2 weeks on one week off 12/30/2016-Oct 2018 5.SWOG 1418 clinical trial Pembrolizumabcycle 1 given 05/03/2017(patient decided to discontinue clinical trial) -----------------------------------------------------------------------------------------------------------------------------------------  Surveillance: 1.  Breast exam 09/06/2017: Benign 2. mammogram [facility]/24/2017 [facility] No evidence of malignancy.  Breast density category 8 Return to clinic in 1 year for follow-up.

## 2017-11-29 NOTE — Telephone Encounter (Signed)
Patient did not show up for her appointments this afternoon.  Called patient to check on her and she states she is ok and she forgot about the appointment.  She says she is in the process of moving, closing on a new house next week, and has been very busy.  Asked patient if she is willing to come within the next 7 days since study protocol allows 7 day window for visits and she agreed stating this Friday would work best.  Damaris Schooner with Dr. Lindi Adie and he said he can see patient this Friday at 1 pm.  Called patient back and informed her of appointment this Friday 6/28 at 12:30 pm for Lab and 1 pm for Dr. Lindi Adie.  Patient verbalized understanding and she apologized for missing today's appointments. Urgent scheduling request sent to reschedule patients appointments to Friday.  Foye Spurling, BSN, RN Clinical Research Nurse 11/29/2017 3:58 PM

## 2017-12-01 ENCOUNTER — Inpatient Hospital Stay: Payer: Medicare Other

## 2017-12-01 ENCOUNTER — Encounter: Payer: Self-pay | Admitting: *Deleted

## 2017-12-01 ENCOUNTER — Inpatient Hospital Stay (HOSPITAL_BASED_OUTPATIENT_CLINIC_OR_DEPARTMENT_OTHER): Payer: Medicare Other | Admitting: Hematology and Oncology

## 2017-12-01 DIAGNOSIS — Z923 Personal history of irradiation: Secondary | ICD-10-CM | POA: Diagnosis not present

## 2017-12-01 DIAGNOSIS — Z171 Estrogen receptor negative status [ER-]: Principal | ICD-10-CM

## 2017-12-01 DIAGNOSIS — Z9221 Personal history of antineoplastic chemotherapy: Secondary | ICD-10-CM | POA: Diagnosis not present

## 2017-12-01 DIAGNOSIS — C50211 Malignant neoplasm of upper-inner quadrant of right female breast: Secondary | ICD-10-CM

## 2017-12-01 DIAGNOSIS — Z853 Personal history of malignant neoplasm of breast: Secondary | ICD-10-CM | POA: Diagnosis not present

## 2017-12-01 DIAGNOSIS — Z006 Encounter for examination for normal comparison and control in clinical research program: Secondary | ICD-10-CM | POA: Diagnosis not present

## 2017-12-01 LAB — COMPREHENSIVE METABOLIC PANEL
ALBUMIN: 4.1 g/dL (ref 3.5–5.0)
ALK PHOS: 63 U/L (ref 38–126)
ALT: 54 U/L — AB (ref 0–44)
AST: 56 U/L — AB (ref 15–41)
Anion gap: 7 (ref 5–15)
BUN: 15 mg/dL (ref 8–23)
CALCIUM: 9.9 mg/dL (ref 8.9–10.3)
CO2: 28 mmol/L (ref 22–32)
Chloride: 105 mmol/L (ref 98–111)
Creatinine, Ser: 0.77 mg/dL (ref 0.44–1.00)
GFR calc Af Amer: >60 mL/min (ref >60)
GFR calc non Af Amer: >60 mL/min (ref >60)
GLUCOSE: 163 mg/dL — AB (ref 70–99)
Potassium: 4.8 mmol/L (ref 3.5–5.1)
Sodium: 140 mmol/L (ref 135–145)
TOTAL PROTEIN: 7.2 g/dL (ref 6.5–8.1)
Total Bilirubin: 1.3 mg/dL — ABNORMAL HIGH (ref 0.3–1.2)

## 2017-12-01 LAB — CBC WITH DIFFERENTIAL (CANCER CENTER ONLY)
BASOS PCT: 1 %
Basophils Absolute: 0 10*3/uL (ref 0.0–0.1)
Eosinophils Absolute: 0.1 10*3/uL (ref 0.0–0.5)
Eosinophils Relative: 3 %
HEMATOCRIT: 39.2 % (ref 34.8–46.6)
HEMOGLOBIN: 13.1 g/dL (ref 11.6–15.9)
Lymphocytes Relative: 31 %
Lymphs Abs: 1.5 10*3/uL (ref 0.9–3.3)
MCH: 33.4 pg (ref 25.1–34.0)
MCHC: 33.4 g/dL (ref 31.5–36.0)
MCV: 100 fL (ref 79.5–101.0)
Monocytes Absolute: 0.4 10*3/uL (ref 0.1–0.9)
Monocytes Relative: 8 %
NEUTROS ABS: 2.8 10*3/uL (ref 1.5–6.5)
NEUTROS PCT: 57 %
Platelet Count: 161 10*3/uL (ref 145–400)
RBC: 3.92 MIL/uL (ref 3.70–5.45)
RDW: 13.5 % (ref 11.2–14.5)
WBC Count: 4.8 10*3/uL (ref 3.9–10.3)

## 2017-12-01 NOTE — Assessment & Plan Note (Addendum)
02/29/2016: Right breast palpable mass (with silicone implants 3462), 3.5 cm on MRI, additional 3 cm anterior linear enhancement (biopsy 03/14/2016 IDC grade 3); 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  T2 N0 stage 2A clinical stage  Treatment summary: 1. Neoadjuvant chemotherapy with dose dense Adriamycin and Cytoxan 4 followed by Abraxane weekly 5(stopped early due to neuropathy) 2. 09/13/2016: Right lumpectomy: IDC grade 3, 2 foci, 2 cm and 1.1 cm, 0/3 lymph nodes negative margins negative, ER 0%, PR 0%, HER-2 negative ratio 1.02, Ki-67 60%, RCB-II; ypT2ypN0 Stage 2A (with plastic surgery removing the ruptured implant) 3. Followed by radiation therapy completed 12/06/2016 4.Adjuvant Xeloda 1000 mg by mouth twice a day 2 weeks on one week off 12/30/2016-Oct 2018 5.SWOG 1418 clinical trial Pembrolizumabcycle 1 given 05/03/2017(patient decided to discontinue clinical trial) -----------------------------------------------------------------------------------------------------------------------------------------  Surveillance: 1.  Breast exam 09/06/2017: Benign 2. mammogram 03/24/2017 at [facility]: No evidence of malignancy.  Breast density category 8 Return to clinic .

## 2017-12-01 NOTE — Progress Notes (Signed)
OFF EARLY CYCLE 4 VISIT FOR B1517 CLINICAL TRIAL. Research nurse greeted patient in lobby and accompanied her to lab and then exam room for MD visit.  Concomitant medications reviewed with patient;  Patient reports stopped taking Cymbalta 3 weeks ago because she felt it was making her feel tired.  She also stopped taking Trazodone for same reason.  She is not taking Magnesium any more because she ran out 2 months ago and has not missed it.  Patient also reports Endocrinologist increased her dose of Metformin from once a day to twice daily.  No other changes or new medications per patient report.  AEs reviewed with patient; Patient denies any new or worsening AEs on today's visit.  Previous AEs reviewed; 1. Back pain is ongoing and intermittent depending on activity level. She is not taking any medication for this.  2. Phylliss Blakes feels it has improved due to stopping above medications and also with the change in the seasons and more sunlight.  Patient continues to work part time, volunteers in the community and is closing on a new house next week.  She also takes care of 3 dogs at home so she is staying active.   3. Neuropathy is unchanged, does not cause pain or impair any function or ADLs. She says it is not any worse since stopping Cymbalta. 4. Headaches are improved from last visit, patient reports the occasional mild headache, but has not taken anything for them in 2 months.  H&P, PS and Toxicity Review completed by Dr. Lindi Adie. Thanked patient very much for her time today and participation in this study. Her next study visit is due 9/18/19for cycle 5. She verbalized understanding. She agrees to return in 12 weeks for next study visit with lab and MD. Thanked patient very much for her ongoing participation in this clinical trial.  Asked her to call research nurse if any questions or problems prior to next appointment on 11/29/17.   AEsfor Cycle 3; 09/06/17-12/01/17  Event Grade Onset Date End  Date Status Comment Attribution toPembrolizumab Immune Related Radiation Related  Back Pain 1 05/30/17  ongoing Intermittent prior to starting on study Unlikely    No    No  Fatigue  1 06/14/17 11/10/17 resolved At baseline, resolved, returned Unlikey    No     No  Headaches 2 08/23/17 11/10/17 ongoing occasional Unlikely    No      No   Foye Spurling, BSN, RN Clinical Research Nurse 12/01/2017 2:00 PM

## 2017-12-01 NOTE — Progress Notes (Signed)
Patient Care Team: Tammi Sou, MD as PCP - General (Family Medicine) Alphonsa Overall, MD as Consulting Physician (General Surgery) Nicholas Lose, MD as Consulting Physician (Hematology and Oncology) Kyung Rudd, MD as Consulting Physician (Radiation Oncology) Irene Limbo, MD as Consulting Physician (Plastic Surgery) Gerome Apley, MD as Consulting Physician (Endocrinology) Delice Bison Charlestine Massed, NP as Nurse Practitioner (Hematology and Oncology)  DIAGNOSIS:  Encounter Diagnosis  Name Primary?  . Malignant neoplasm of upper-inner quadrant of right breast in female, estrogen receptor negative ([place])     SUMMARY OF ONCOLOGIC HISTORY:   Breast cancer of upper-inner quadrant of right female breast ([place])   02/29/2016 Initial Diagnosis    Right breast palpable mass (with silicone implants 1017), 3.5 cm on MRI, additional 3 cm anterior linear enhancement? 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 Ki-67 60%, T2 N0 stage 2A clinical stage      03/14/2016 Procedure    Right breast biopsy upper inner quadrant: IDC grade 3      03/28/2016 -  Neo-Adjuvant Chemotherapy    Neoadjuvant chemotherapy with dose dense Adriamycin and Cytoxan followed by Abraxane weekly 5 ( patient is diabetic and cannot take steroids)      07/15/2016 Breast MRI    Right breast: Spiculated mass 1.5 cm significantly smaller compared to prior,NME previously seen is not noted, no abnormal lymph nodes      09/13/2016 Surgery    Removal of the silicone implant due to intracapsular rupture and capsulectomy (Dr.Thimappa)      09/13/2016 Surgery    Right lumpectomy: IDC grade 3, 2 foci, 2 cm and 1.1 cm, 0/3 lymph nodes negative margins negative, ER 0%, PR 0%, HER-2 negative ratio 1.02, Ki-67 60%, RCB-II; ypT2ypN0 Stage 2A       10/20/2016 - 12/06/2016 Radiation Therapy    Adjuvant radiation therapy      12/30/2016 - 04/05/2017 Chemotherapy    Xeloda 1000 mg by mouth twice a day adjuvant therapy x 4  cycles       05/03/2017 - 05/24/2017 Chemotherapy    SWOG S 1418 Pembrolizumab on clinical trial stopped after 2 doses by patient preference (not due to toxicities .)       CHIEF COMPLIANT: Follow-up of labs after discontinuing immunotherapy  INTERVAL HISTORY: Marissa Hall is a 77 year old with above-mentioned history of triple negative breast cancer who is currently on observation.  She went on immunotherapy clinical trial briefly but could not continue it because of patient preference and not because of toxicities.  She is here for lab count check and she was willing to continue to provide data for the clinical trial.  She reports no new symptoms or concerns.  REVIEW OF SYSTEMS:   Constitutional: Denies fevers, chills or abnormal weight loss Eyes: Denies blurriness of vision Ears, nose, mouth, throat, and face: Denies mucositis or sore throat Respiratory: Denies cough, dyspnea or wheezes Cardiovascular: Denies palpitation, chest discomfort Gastrointestinal:  Denies nausea, heartburn or change in bowel habits Skin: Denies abnormal skin rashes Lymphatics: Denies new lymphadenopathy or easy bruising Neurological:Denies numbness, tingling or new weaknesses Behavioral/Psych: Mood is stable, no new changes  Extremities: No lower extremity edema  All other systems were reviewed with the patient and are negative.  I have reviewed the past medical history, past surgical history, social history and family history with the patient and they are unchanged from previous note.  ALLERGIES:  is allergic to other and penicillins.  MEDICATIONS:  Current Outpatient Medications  Medication Sig Dispense Refill  .  atorvastatin (LIPITOR) 80 MG tablet Take 80 mg by mouth every evening.     [name] Kitchen azelastine (ASTELIN) 0.1 % nasal spray Place 2 sprays into both nostrils daily as needed for rhinitis or allergies. Use in each nostril as directed    . lisinopril (PRINIVIL,ZESTRIL) 20 MG tablet Take 20 mg by  mouth every evening.     [name] Kitchen omeprazole (PRILOSEC) 40 MG capsule Take 1 capsule (40 mg total) by mouth daily. (Patient taking differently: Take 40 mg as needed by mouth (Heart Burn). ) 30 capsule 2  . pyridoxine (B-6) 100 MG tablet Take 100 mg by mouth daily.     [name] Kitchen levothyroxine (SYNTHROID, LEVOTHROID) 112 MCG tablet Take 1 tablet by mouth daily.    . magnesium oxide (MAG-OX) 400 (241.3 Mg) MG tablet Take 1.5 tablets (600 mg total) by mouth daily. (Patient not taking: Reported on 12/01/2017) 30 tablet 3  . metFORMIN (GLUCOPHAGE-XR) 500 MG 24 hr tablet Take 1,000 mg by mouth daily with supper.     . traZODone (DESYREL) 50 MG tablet 1-3 tabs po qhs prn anxiety (Patient not taking: Reported on 12/01/2017) 60 tablet 3   No current facility-administered medications for this visit.     PHYSICAL EXAMINATION: ECOG PERFORMANCE STATUS: 1 - Symptomatic but completely ambulatory  Vitals:   12/01/17 1321  BP: 138/70  Pulse: 68  Resp: 18  Temp: 98 F (36.7 C)  SpO2: 100%   Filed Weights   12/01/17 1321  Weight: 171 lb (77.6 kg)    GENERAL:alert, no distress and comfortable SKIN: skin color, texture, turgor are normal, no rashes or significant lesions EYES: normal, Conjunctiva are pink and non-injected, sclera clear OROPHARYNX:no exudate, no erythema and lips, buccal mucosa, and tongue normal  NECK: supple, thyroid normal size, non-tender, without nodularity LYMPH:  no palpable lymphadenopathy in the cervical, axillary or inguinal LUNGS: clear to auscultation and percussion with normal breathing effort HEART: regular rate & rhythm and no murmurs and no lower extremity edema ABDOMEN:abdomen soft, non-tender and normal bowel sounds MUSCULOSKELETAL:no cyanosis of digits and no clubbing  NEURO: alert & oriented x 3 with fluent speech, no focal motor/sensory deficits EXTREMITIES: No lower extremity edema   LABORATORY DATA:  I have reviewed the data as listed CMP Latest Ref Rng & Units 09/06/2017  06/14/2017 05/01/2017  Glucose 70 - 140 mg/dL 177(H) 181(H) 156(H)  BUN 7 - 26 mg/dL 19 15 14.9  Creatinine 0.60 - 1.10 mg/dL 0.83 0.76 0.8  Sodium 136 - 145 mmol/L 135(L) 137 136  Potassium 3.5 - 5.1 mmol/L 4.3 4.2 4.5  Chloride 98 - 109 mmol/L 102 105 -  CO2 22 - 29 mmol/L _0 Calcium 8.4 - 10.4 mg/dL 9.3 9.1 9.6  Total Protein 6.4 - 8.3 g/dL 6.7 6.9 6.9  Total Bilirubin 0.2 - 1.2 mg/dL 0.9 1.1 1.30(H)  Alkaline Phos 40 - 150 U/L 73 71 75  AST 5 - 34 U/L 62(H) 44(H) 51(H)  ALT 0 - 55 U/L 48 52 62(H)    Lab Results  Component Value Date   WBC 4.8 12/01/2017   HGB 13.1 12/01/2017   HCT 39.2 12/01/2017   MCV 100.0 12/01/2017   PLT 161 12/01/2017   NEUTROABS 2.8 12/01/2017    ASSESSMENT & PLAN:  Breast cancer of upper-inner quadrant of right female breast ([place]) 02/29/2016: Right breast palpable mass (with silicone implants 6945), 3.5 cm on MRI, additional 3 cm anterior linear enhancement (biopsy 03/14/2016 IDC grade 3); grade 3 IDC triple  negative Ki-67 60%.  T2 N0 stage 2A clinical stage  Treatment summary: 1. Neoadjuvant chemotherapy with dose dense Adriamycin and Cytoxan 4 followed by Abraxane weekly 5(stopped early due to neuropathy) 2. 09/13/2016: Right lumpectomy: IDC grade 3, 2 foci, 2 cm and 1.1 cm, 0/3 lymph nodes negative margins negative, ER 0%, PR 0%, HER-2 negative ratio 1.02, Ki-67 60%, RCB-II; ypT2ypN0 Stage 2A (with plastic surgery removing the ruptured implant) 3. Followed by radiation therapy completed 12/06/2016 4.Adjuvant Xeloda 1000 mg by mouth twice a day 2 weeks on one week off 12/30/2016-Oct 2018 5.SWOG 1418 clinical trial Pembrolizumabcycle 1 given 05/03/2017(patient decided to discontinue clinical trial) -----------------------------------------------------------------------------------------------------------------------------------------  Surveillance: 1.  Breast exam 09/06/2017: Benign 2. mammogram 03/24/2017 at [facility]: No evidence of  malignancy.  Breast density category B Return to clinic in 3 months for follow-up and labs.    No orders of the defined types were placed in this encounter.  The patient has a good understanding of the overall plan. she agrees with it. she will call with any problems that may develop before the next visit here.   Harriette Ohara, MD 12/01/17

## 2017-12-03 ENCOUNTER — Encounter: Payer: Self-pay | Admitting: Family Medicine

## 2017-12-04 ENCOUNTER — Telehealth: Payer: Self-pay | Admitting: Hematology and Oncology

## 2017-12-04 NOTE — Telephone Encounter (Signed)
Mailed patient calendar of upcoming September appointments.  °

## 2017-12-14 ENCOUNTER — Other Ambulatory Visit: Payer: Medicare Other

## 2017-12-14 ENCOUNTER — Ambulatory Visit: Payer: Medicare Other | Admitting: Hematology and Oncology

## 2018-01-05 ENCOUNTER — Other Ambulatory Visit: Payer: Self-pay

## 2018-02-07 ENCOUNTER — Telehealth: Payer: Self-pay | Admitting: *Deleted

## 2018-02-07 NOTE — Telephone Encounter (Signed)
Patient called to report increase in neuropathy pain and numbness moving up her legs and increase in joint and muscle pain especially in her lower back and hips.  Advised patient to see her PCP for the hip/back pain and neuropathy.  But since she has diabetes she should also see her endocrinologist to make sure her diabetes is managed as uncontrolled diabetes can cause neuropathy.  She has appointment for study visit to see Dr. Lindi Adie in 2 weeks on 02/21/18 and suggested patient also inform Dr. Lindi Adie of these symptoms.  Patient aware of this appointment and verbalized understanding.  She says she will contact PCP to start with for her pain and neuropathy.  Patient also asks if Dr. Lindi Adie can provide her a letter for court stating she is still being followed for breast cancer every 3 months.  She needs this letter for legal reasons. She was unsure of all the details to include in the letter but will ask her lawyer and then email research nurse with the details.  Informed patient I will look for her email and will share her request with Dr. Lindi Adie.  Instructed patient to call if any other questions or concerns prior to next appointment on 02/21/18.  She verbalized understanding.  Foye Spurling, BSN, RN Clinical Research Nurse 02/07/2018 1:44 PM

## 2018-02-08 ENCOUNTER — Telehealth: Payer: Self-pay | Admitting: Family Medicine

## 2018-02-08 NOTE — Telephone Encounter (Signed)
OK with me.

## 2018-02-08 NOTE — Telephone Encounter (Signed)
Copied from [place] [id number]. Topic: Quick Communication - See Telephone Encounter >> Feb 08, 2018 12:05 PM Gardiner Ramus wrote: CRM for notification. See Telephone encounter for: 02/08/18. Pt called and stated that she [employer] and works in [place] and would like to know if she could transfer care to [facility]. Pt states [place] location would be more convenient. Please advise

## 2018-02-09 ENCOUNTER — Ambulatory Visit (INDEPENDENT_AMBULATORY_CARE_PROVIDER_SITE_OTHER): Payer: Medicare Other | Admitting: Family Medicine

## 2018-02-09 ENCOUNTER — Encounter: Payer: Self-pay | Admitting: Family Medicine

## 2018-02-09 VITALS — BP 143/80 | HR 58 | Temp 98.1°F | Resp 16 | Ht 68.5 in | Wt 170.4 lb

## 2018-02-09 DIAGNOSIS — M533 Sacrococcygeal disorders, not elsewhere classified: Secondary | ICD-10-CM | POA: Diagnosis not present

## 2018-02-09 DIAGNOSIS — F5101 Primary insomnia: Secondary | ICD-10-CM

## 2018-02-09 MED ORDER — TRAZODONE HCL 50 MG PO TABS: ORAL_TABLET | ORAL | 1 refills | Status: DC

## 2018-02-09 NOTE — Patient Instructions (Signed)
Take THREE over the counter ibuprofen tabs TWICE a day with food for the next 10-14 days.  Apply heat to your areas of pain for 20 minutes daily.

## 2018-02-09 NOTE — Telephone Encounter (Signed)
Ok with me 

## 2018-02-09 NOTE — Progress Notes (Signed)
OFFICE VISIT  02/11/2018   CC:  Chief Complaint  Patient presents with  . Back Pain  . Hip Pain    HPI:    Patient is a 77 y.o. Caucasian female who presents for back and hip pain. About 6-8 weeks ago started having generalized low back pain, extends into R glut > L glut region. Worse with bending over, prolonged standing, or prolonged sitting.  Better when gets up and walks around. No preceding trauma or strain.  Taking advil most days, 1 tab tid to qid and this helps.  No pain radiating from back or hips down legs.  She does have chronic lower leg/feet neuropathic pain chronically.  Some mild generalized fatigue---which she seems to have chronically. No fevers, no unexplained wt loss. No abd pain.  No dizziness. Pt having trouble sleeping still, asks if I can restart her on the trazodone that had been somewhat helpful in the past.  Past Medical History:  Diagnosis Date  . Anxiety and depression 1964   oncologist started duloxetine 12/2016  . Breast cancer ([place]) 03/14/2016   Clinical stage 2A: (triple neg): Right breast, upper inner quadrant, 03/2016.  Neoadjuvant chemo x 5 cycles, then lumpectomy 4 mo later, then radiation started 10/2016.  Adjuvant Xeloda Aug through october 2018.  SWOG research trial pt 04/2017--pt randomized to pembrolizumab immunotherapy.  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  . Cataracts, bilateral 07/2017  . 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 Depression 1964   Patient states since age 7  . Diabetes ([place]) 2008   managed by endocrinology.  [name] Kitchen GERD (gastroesophageal reflux disease) 2013  . Hyperlipidemia 1986  . Hypertension 2008  . Hypothyroidism 1988   Diagnosed in her 9s.  Managed by Endocrinologist  . Osteoporosis 2015   pt states "osteopenia", but then says that she refused to take the rx med for this condition, so I suspect she  had osteoporosis.  . Peripheral neuropathy 2017   Patient states diabetic neuropathy in feet prior to starting chemotherapy and then worsened by chemo.   [name] Kitchen TIA (transient ischemic attack) 03/26/2011    Past Surgical History:  Procedure Laterality Date  . ABDOMINAL HYSTERECTOMY  1972  . APPENDECTOMY  1972  . BREAST ENHANCEMENT SURGERY  1982  . BREAST IMPLANT REMOVAL Right 09/13/2016   Procedure: REMOVAL RIGHT BREAST IMPLANT;  Surgeon: Irene Limbo, MD;  Location: [facility];  Service: Plastics;  Laterality: Right;  . BREAST LUMPECTOMY WITH RADIOACTIVE SEED AND SENTINEL LYMPH NODE BIOPSY Right 09/13/2016   Procedure: RIGHT BREAST LUMPECTOMY WITH RADIOACTIVE SEED X 2 AND SENTINEL LYMPH NODE BIOPSY;  Surgeon: Alphonsa Overall, MD;  Location: [facility];  Service: General;  Laterality: Right;  . BREAST SURGERY Right 03/14/2016   Biopsy  . CAPSULECTOMY Right 09/13/2016   Procedure: RIGHT CAPSULECTOMY;  Surgeon: Irene Limbo, MD;  Location: [facility];  Service: Plastics;  Laterality: Right;  . CATARACT EXTRACTION, BILATERAL Bilateral 08/10/17 right eye, 08/31/17 left eye  . PORTACATH PLACEMENT N/A 03/15/2016   Procedure: INSERTION PORT-A-CATH WITH [place];  Surgeon: Alphonsa Overall, MD;  Location: [facility];  Service: General;  Laterality: N/A;  . PORTACATH REMOVAL  07/2017  . surgical repair left ankle Left 2009   s/p Fall   . TONSILLECTOMY AND ADENOIDECTOMY  1948   Age 89    Outpatient Medications Prior to Visit  Medication  Sig Dispense Refill  . atorvastatin (LIPITOR) 80 MG tablet Take 80 mg by mouth every evening.     [name] Kitchen azelastine (ASTELIN) 0.1 % nasal spray Place 2 sprays into both nostrils daily as needed for rhinitis or allergies. Use in each nostril as directed    . levothyroxine (SYNTHROID, LEVOTHROID) 112 MCG tablet Take 1 tablet by mouth daily.    [name] Kitchen lisinopril (PRINIVIL,ZESTRIL) 20 MG tablet Take 20 mg by mouth every evening.     . metFORMIN  (GLUCOPHAGE-XR) 500 MG 24 hr tablet Take 1,000 mg by mouth daily.     [name] Kitchen omeprazole (PRILOSEC) 40 MG capsule Take 1 capsule (40 mg total) by mouth daily. (Patient taking differently: Take 40 mg as needed by mouth (Heart Burn). ) 30 capsule 2  . pyridoxine (B-6) 100 MG tablet Take 100 mg by mouth daily.      No facility-administered medications prior to visit.     Allergies  Allergen Reactions  . Other Other (See Comments)    STEROIDS- emotional  . Penicillins Other (See Comments)    Unsure of reaction, was 77 years old    ROS As per HPI  PE: Blood pressure (!) 143/80, pulse (!) 58, temperature 98.1 F (36.7 C), temperature source Oral, resp. rate 16, height 5' 8.5" (1.74 m), weight 170 lb 6 oz (77.3 kg), SpO2 98 %. Body mass index is 25.53 kg/m.  Gen: Alert, well appearing.  Patient is oriented to person, place, time, and situation. Cervical, thoracic and lumbar spine exam is normal without tenderness, masses or kyphoscoliosis. Full range of motion without pain is noted.  She has a mild amount of tenderness to deep palpation at the proximal SI region R>L. FABER neg bilat for any pain.  LABS:    Chemistry      Component Value Date/Time   NA 140 12/01/2017 1244   NA 136 05/01/2017 0914   K 4.8 12/01/2017 1244   K 4.5 05/01/2017 0914   CL 105 12/01/2017 1244   CO2 28 12/01/2017 1244   CO2 24 05/01/2017 0914   BUN 15 12/01/2017 1244   BUN 14.9 05/01/2017 0914   CREATININE 0.77 12/01/2017 1244   CREATININE 0.83 09/06/2017 1440   CREATININE 0.8 05/01/2017 0914      Component Value Date/Time   CALCIUM 9.9 12/01/2017 1244   CALCIUM 9.6 05/01/2017 0914   ALKPHOS 63 12/01/2017 1244   ALKPHOS 75 05/01/2017 0914   AST 56 (H) 12/01/2017 1244   AST 62 (H) 09/06/2017 1440   AST 51 (H) 05/01/2017 0914   ALT 54 (H) 12/01/2017 1244   ALT 48 09/06/2017 1440   ALT 62 (H) 05/01/2017 0914   BILITOT 1.3 (H) 12/01/2017 1244   BILITOT 0.9 09/06/2017 1440   BILITOT 1.30 (H) 05/01/2017  0914     Lab Results  Component Value Date   WBC 4.8 12/01/2017   HGB 13.1 12/01/2017   HCT 39.2 12/01/2017   MCV 100.0 12/01/2017   PLT 161 12/01/2017    IMPRESSION AND PLAN:  1) Low back pain, suspect SI joint pain, diffuse musculoskeletal low back strain. Recommended ibup 600mg  bid with food x 10-14d. Heat application 20 min qd-bid discussed. Referred pt for PT today.  Pt living in [place] now, will f/u with me one more time and if all going ok she will transfer to a [place] primary care more convenient to her home.  2) Insomnia: restart trazodone 50mg , 1-3 qhs prn.  Spent 25 min with pt today,  with >50% of this time spent in counseling and care coordination regarding the above problems.  An After Visit Summary was printed and given to the patient.  FOLLOW UP: Return in about 6 weeks (around 03/23/2018) for f/u back pain/mood/insomnia (30 min).  Signed:  Crissie Sickles, MD           02/11/2018

## 2018-02-09 NOTE — Telephone Encounter (Signed)
Called patient and left message informing of both responses and asked her to call back to schedule an appointment. Please schedule as TOC visit.

## 2018-02-09 NOTE — Telephone Encounter (Signed)
Marissa Hall,  Is is it okay for the patient to transfer to see you? Please advise.

## 2018-02-13 ENCOUNTER — Ambulatory Visit: Payer: Self-pay | Admitting: Surgery

## 2018-02-13 DIAGNOSIS — D1724 Benign lipomatous neoplasm of skin and subcutaneous tissue of left leg: Secondary | ICD-10-CM | POA: Diagnosis not present

## 2018-02-13 NOTE — H&P (View-Only) (Signed)
Grayling Congress Documented: 02/13/2018 9:58 AM Location: [facility] Patient #: [id number] DOB: [birth year] Widowed / Language: Cleophus Molt / Race: White Female  History of Present Illness Marcello Moores A. Lusia Greis MD; 02/13/2018 10:07 AM) Patient words: Patient presents for evaluation of a left medial proximal thigh mass. This is located almost in her left groin. It's been present for many years beginning larger causing more discomfort. There's been no redness nor drainage. The mass is sore with contact and is removing her clothing which is new for her.  The patient is a 77 year old female.   Allergies (Tanisha A. Owens Shark, [place]; 02/13/2018 9:59 AM) Penicillins Allergies Reconciled  Medication History (Tanisha A. Brown, RMA; 02/13/2018 10:00 AM) MetFORMIN HCl ER ('500MG'$  Tablet ER 24HR, Oral daily) Active. Gabapentin ('100MG'$  Capsule, Oral daily) Active. Illusions C Breast Prosthesis Active. (L8001 Uni-lateral PermaForm - Integrated prosthesis in bra for ease of use or for compression; QTY:2; Length of Use: 6 months; Replacement: Comopression due to tenderness; L8000 Post-surgical bras - to hold breast prosthesis ; QTY: 6; Length of use:3 Months 3 Refills ; L8020Non-silicone breast prosthesis - for temporary use during healing or for comfort at end of day, lighter weight, cooler for hot weather.; QTY: 2 for single; Length of use: 6 months Up to 1 refill; Replacement ; F0932 Silicone Breast Prosthesis - to restore balance and symmetry after breast surgery; QTY: 1 for single; Length of use: 2 years No refills) FLUoxetine HCl ('10MG'$  Tablet, Oral) Active. Ibuprofen ('200MG'$  Tablet, Oral) Active. LORazepam (0.'5MG'$  Tablet, Oral) Active. Levothyroxine Sodium (100MCG Tablet, Oral) Active. Lisinopril ('20MG'$  Tablet, Oral) Active. Accu-Chek Aviva Plus (w/Device Kit,) Active. Atorvastatin Calcium ('80MG'$  Tablet, Oral) Active. Vitamin D (Ergocalciferol) (50000UNIT Capsule, Oral) Active. Medications  Reconciled    Vitals (Tanisha A. Brown RMA; 02/13/2018 9:59 AM) 02/13/2018 9:59 AM Weight: 170.2 lb Height: 69in Body Surface Area: 1.93 m Body Mass Index: 25.13 kg/m  Temp.: 98.52F  Pulse: 73 (Regular)  BP: 134/84 (Sitting, Left Arm, Standard)      Physical Exam (Zuria Fosdick A. Aniesha Haughn MD; 02/13/2018 10:08 AM)  General Mental Status-Alert. General Appearance-Consistent with stated age. Hydration-Well hydrated. Voice-Normal.  Integumentary Note: Proximal medial left thigh is a 5 cm x 4 cm mobile tender mass consistent with lipoma.  Head and Neck Head-normocephalic, atraumatic with no lesions or palpable masses. Trachea-midline. Thyroid Gland Characteristics - normal size and consistency.  Eye Eyeball - Bilateral-Extraocular movements intact. Sclera/Conjunctiva - Bilateral-No scleral icterus.  Neurologic Neurologic evaluation reveals -alert and oriented x 3 with no impairment of recent or remote memory. Mental Status-Normal.  Musculoskeletal Normal Exam - Left-Upper Extremity Strength Normal and Lower Extremity Strength Normal. Normal Exam - Right-Upper Extremity Strength Normal and Lower Extremity Strength Normal.    Assessment & Plan (Steadman Prosperi A. 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 Risks, benefits mother options including nonoperative options discussed. Given the increasing size increased pain she desires excision. Risk of bleeding, infection, recurrence, poor wound healing, injury to neighboring structures of blood vessels, nerves and veins discussed. She agrees to proceed.  Current Plans Pt Education - CCS Free Text Education/Instructions: discussed with patient and provided information. Pt Education - CCS General Post-op HCI The anatomy and the physiology was discussed. The pathophysiology and natural history of the disease was  discussed. Options were discussed and recommendations were made. Technique, risks, benefits, & alternatives were discussed. Risks such as stroke, heart attack, bleeding, indection, death, and other  risks discussed. Questions answered. The patient agrees to proceed.

## 2018-02-13 NOTE — H&P (Signed)
Grayling Congress Documented: 02/13/2018 9:58 AM Location: [facility] Patient #: [id number] DOB: Mar 12, 1941 Widowed / Language: Cleophus Molt / Race: White Female  History of Present Illness Marcello Moores A. Marx Doig MD; 02/13/2018 10:07 AM) Patient words: Patient presents for evaluation of a left medial proximal thigh mass. This is located almost in her left groin. It's been present for many years beginning larger causing more discomfort. There's been no redness nor drainage. The mass is sore with contact and is removing her clothing which is new for her.  The patient is a 77 year old female.   Allergies (Tanisha A. Owens Shark, [place]; 02/13/2018 9:59 AM) Penicillins Allergies Reconciled  Medication History (Tanisha A. Brown, RMA; 02/13/2018 10:00 AM) MetFORMIN HCl ER ('500MG'$  Tablet ER 24HR, Oral daily) Active. Gabapentin ('100MG'$  Capsule, Oral daily) Active. Illusions C Breast Prosthesis Active. (L8001 Uni-lateral PermaForm - Integrated prosthesis in bra for ease of use or for compression; QTY:2; Length of Use: 6 months; Replacement: Comopression due to tenderness; L8000 Post-surgical bras - to hold breast prosthesis ; QTY: 6; Length of use:3 Months 3 Refills ; L8020Non-silicone breast prosthesis - for temporary use during healing or for comfort at end of day, lighter weight, cooler for hot weather.; QTY: 2 for single; Length of use: 6 months Up to 1 refill; Replacement ; W9675 Silicone Breast Prosthesis - to restore balance and symmetry after breast surgery; QTY: 1 for single; Length of use: 2 years No refills) FLUoxetine HCl ('10MG'$  Tablet, Oral) Active. Ibuprofen ('200MG'$  Tablet, Oral) Active. LORazepam (0.'5MG'$  Tablet, Oral) Active. Levothyroxine Sodium (100MCG Tablet, Oral) Active. Lisinopril ('20MG'$  Tablet, Oral) Active. Accu-Chek Aviva Plus (w/Device Kit,) Active. Atorvastatin Calcium ('80MG'$  Tablet, Oral) Active. Vitamin D (Ergocalciferol) (50000UNIT Capsule, Oral) Active. Medications  Reconciled    Vitals (Tanisha A. Brown RMA; 02/13/2018 9:59 AM) 02/13/2018 9:59 AM Weight: 170.2 lb Height: 69in Body Surface Area: 1.93 m Body Mass Index: 25.13 kg/m  Temp.: 98.69F  Pulse: 73 (Regular)  BP: 134/84 (Sitting, Left Arm, Standard)      Physical Exam (Dana Dorner A. Asta Corbridge MD; 02/13/2018 10:08 AM)  General Mental Status-Alert. General Appearance-Consistent with stated age. Hydration-Well hydrated. Voice-Normal.  Integumentary Note: Proximal medial left thigh is a 5 cm x 4 cm mobile tender mass consistent with lipoma.  Head and Neck Head-normocephalic, atraumatic with no lesions or palpable masses. Trachea-midline. Thyroid Gland Characteristics - normal size and consistency.  Eye Eyeball - Bilateral-Extraocular movements intact. Sclera/Conjunctiva - Bilateral-No scleral icterus.  Neurologic Neurologic evaluation reveals -alert and oriented x 3 with no impairment of recent or remote memory. Mental Status-Normal.  Musculoskeletal Normal Exam - Left-Upper Extremity Strength Normal and Lower Extremity Strength Normal. Normal Exam - Right-Upper Extremity Strength Normal and Lower Extremity Strength Normal.    Assessment & Plan (Skyley Grandmaison A. 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 Risks, benefits mother options including nonoperative options discussed. Given the increasing size increased pain she desires excision. Risk of bleeding, infection, recurrence, poor wound healing, injury to neighboring structures of blood vessels, nerves and veins discussed. She agrees to proceed.  Current Plans Pt Education - CCS Free Text Education/Instructions: discussed with patient and provided information. Pt Education - CCS General Post-op HCI The anatomy and the physiology was discussed. The pathophysiology and natural history of the disease was  discussed. Options were discussed and recommendations were made. Technique, risks, benefits, & alternatives were discussed. Risks such as stroke, heart attack, bleeding, indection, death, and other  risks discussed. Questions answered. The patient agrees to proceed.

## 2018-02-20 ENCOUNTER — Other Ambulatory Visit: Payer: Self-pay | Admitting: *Deleted

## 2018-02-20 DIAGNOSIS — Z006 Encounter for examination for normal comparison and control in clinical research program: Secondary | ICD-10-CM

## 2018-02-21 ENCOUNTER — Encounter (HOSPITAL_BASED_OUTPATIENT_CLINIC_OR_DEPARTMENT_OTHER): Payer: Self-pay | Admitting: *Deleted

## 2018-02-21 ENCOUNTER — Other Ambulatory Visit: Payer: Self-pay

## 2018-02-21 ENCOUNTER — Inpatient Hospital Stay: Payer: Medicare Other | Admitting: Hematology and Oncology

## 2018-02-21 ENCOUNTER — Telehealth: Payer: Self-pay | Admitting: *Deleted

## 2018-02-21 ENCOUNTER — Telehealth: Payer: Self-pay | Admitting: Hematology and Oncology

## 2018-02-21 ENCOUNTER — Inpatient Hospital Stay: Payer: Medicare Other

## 2018-02-21 NOTE — Assessment & Plan Note (Deleted)
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  T2 N0 stage 2A clinical stage  Treatment summary: 1. Neoadjuvant chemotherapy with dose dense Adriamycin and Cytoxan 4 followed by Abraxane weekly 5(stopped early due to neuropathy) 2. 09/13/2016: Right lumpectomy: IDC grade 3, 2 foci, 2 cm and 1.1 cm, 0/3 lymph nodes negative margins negative, ER 0%, PR 0%, HER-2 negative ratio 1.02, Ki-67 60%, RCB-II; ypT2ypN0 Stage 2A (with plastic surgery removing the ruptured implant) 3. Followed by radiation therapy completed 12/06/2016 4.Adjuvant Xeloda 1000 mg by mouth twice a day 2 weeks on one week off 12/30/2016-Oct 2018 5.SWOG 1418 clinical trial Pembrolizumabcycle 1 given 05/03/2017(patient decided to discontinue clinical trial) -----------------------------------------------------------------------------------------------------------------------------------------  Surveillance: 1.Breast exam 02/21/2018: Benign 2.mammogram 03/24/2017 at [facility]: No evidence of malignancy. Breast density category B Return to clinic in 6 months for follow-up and labs. 

## 2018-02-21 NOTE — Telephone Encounter (Signed)
Patient called to cancel °

## 2018-02-21 NOTE — Telephone Encounter (Signed)
Patient did not show up for her appointment this morning.  I called patient to check on her and she states she called and spoke with someone named Sharyn Lull earlier this morning to cancel her appointment. She states she has had a lot of personal problems that have prevented her from keeping her appointment this morning.  Patient states afternoons work better for her since she now lives about an hour away from our clinic.  Informed Dr. Lindi Adie and he says he can see her tomorrow at 3:30 pm.  Called patient back and informed her of appointment rescheduled to tomorrow at 3 pm for lab and 3:30 pm MD.  Instructed patient to arrive 15 minutes early to check in.  She verbalized understanding.  Urgent scheduling request sent.  Foye Spurling, BSN, RN Clinical Research Nurse 02/21/2018 9:16 AM

## 2018-02-22 ENCOUNTER — Inpatient Hospital Stay (HOSPITAL_BASED_OUTPATIENT_CLINIC_OR_DEPARTMENT_OTHER): Payer: Medicare Other | Admitting: Hematology and Oncology

## 2018-02-22 ENCOUNTER — Inpatient Hospital Stay: Payer: Medicare Other | Attending: Radiation Oncology

## 2018-02-22 ENCOUNTER — Encounter: Payer: Self-pay | Admitting: *Deleted

## 2018-02-22 DIAGNOSIS — Z006 Encounter for examination for normal comparison and control in clinical research program: Secondary | ICD-10-CM | POA: Insufficient documentation

## 2018-02-22 DIAGNOSIS — Z171 Estrogen receptor negative status [ER-]: Secondary | ICD-10-CM

## 2018-02-22 DIAGNOSIS — Z9221 Personal history of antineoplastic chemotherapy: Secondary | ICD-10-CM

## 2018-02-22 DIAGNOSIS — Z630 Problems in relationship with spouse or partner: Secondary | ICD-10-CM | POA: Insufficient documentation

## 2018-02-22 DIAGNOSIS — Z923 Personal history of irradiation: Secondary | ICD-10-CM | POA: Diagnosis not present

## 2018-02-22 DIAGNOSIS — C50211 Malignant neoplasm of upper-inner quadrant of right female breast: Secondary | ICD-10-CM

## 2018-02-22 DIAGNOSIS — Z853 Personal history of malignant neoplasm of breast: Secondary | ICD-10-CM | POA: Diagnosis not present

## 2018-02-22 LAB — CMP (CANCER CENTER ONLY)
ALT: 36 U/L (ref 0–44)
AST: 28 U/L (ref 15–41)
Albumin: 4.1 g/dL (ref 3.5–5.0)
Alkaline Phosphatase: 74 U/L (ref 38–126)
Anion gap: 7 (ref 5–15)
BILIRUBIN TOTAL: 1 mg/dL (ref 0.3–1.2)
BUN: 15 mg/dL (ref 8–23)
CHLORIDE: 105 mmol/L (ref 98–111)
CO2: 28 mmol/L (ref 22–32)
Calcium: 9.5 mg/dL (ref 8.9–10.3)
Creatinine: 0.79 mg/dL (ref 0.44–1.00)
GFR, Est AFR Am: >60 mL/min (ref >60)
GLUCOSE: 153 mg/dL — AB (ref 70–99)
POTASSIUM: 4.6 mmol/L (ref 3.5–5.1)
Sodium: 140 mmol/L (ref 135–145)
TOTAL PROTEIN: 7.2 g/dL (ref 6.5–8.1)

## 2018-02-22 LAB — CBC WITH DIFFERENTIAL (CANCER CENTER ONLY)
Basophils Absolute: 0 10*3/uL (ref 0.0–0.1)
Basophils Relative: 1 %
EOS PCT: 3 %
Eosinophils Absolute: 0.1 10*3/uL (ref 0.0–0.5)
HCT: 37.8 % (ref 34.8–46.6)
Hemoglobin: 12.7 g/dL (ref 11.6–15.9)
LYMPHS ABS: 1.2 10*3/uL (ref 0.9–3.3)
LYMPHS PCT: 33 %
MCH: 33.5 pg (ref 25.1–34.0)
MCHC: 33.7 g/dL (ref 31.5–36.0)
MCV: 99.4 fL (ref 79.5–101.0)
MONO ABS: 0.4 10*3/uL (ref 0.1–0.9)
Monocytes Relative: 10 %
Neutro Abs: 2 10*3/uL (ref 1.5–6.5)
Neutrophils Relative %: 53 %
PLATELETS: 141 10*3/uL — AB (ref 145–400)
RBC: 3.8 MIL/uL (ref 3.70–5.45)
RDW: 13.1 % (ref 11.2–14.5)
WBC Count: 3.7 10*3/uL — ABNORMAL LOW (ref 3.9–10.3)

## 2018-02-22 NOTE — Assessment & Plan Note (Signed)
02/29/2016: Right breast palpable mass (with silicone implants 2376), 3.5 cm on MRI, additional 3 cm anterior linear enhancement (biopsy 03/14/2016 IDC grade 3); 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  T2 N0 stage 2A clinical stage  Treatment summary: 1. Neoadjuvant chemotherapy with dose dense Adriamycin and Cytoxan 4 followed by Abraxane weekly 5(stopped early due to neuropathy) 2. 09/13/2016: Right lumpectomy: IDC grade 3, 2 foci, 2 cm and 1.1 cm, 0/3 lymph nodes negative margins negative, ER 0%, PR 0%, HER-2 negative ratio 1.02, Ki-67 60%, RCB-II; ypT2ypN0 Stage 2A (with plastic surgery removing the ruptured implant) 3. Followed by radiation therapy completed 12/06/2016 4.Adjuvant Xeloda 1000 mg by mouth twice a day 2 weeks on one week off 12/30/2016-Oct 2018 5.SWOG 1418 clinical trial Pembrolizumabcycle 1 given 05/03/2017(patient decided to discontinue clinical trial) -----------------------------------------------------------------------------------------------------------------------------------------  Surveillance: 1.Breast exam 02/21/2018: Benign 2.mammogram 03/24/2017 at [facility]: No evidence of malignancy. Breast density category B Return to clinic in 6 months for follow-up and labs.

## 2018-02-22 NOTE — Progress Notes (Signed)
Patient Care Team: Tammi Sou, MD as PCP - General (Family Medicine) Alphonsa Overall, MD as Consulting Physician (General Surgery) Nicholas Lose, MD as Consulting Physician (Hematology and Oncology) Kyung Rudd, MD as Consulting Physician (Radiation Oncology) Irene Limbo, MD as Consulting Physician (Plastic Surgery) Gerome Apley, MD as Consulting Physician (Endocrinology) Delice Bison Charlestine Massed, NP as Nurse Practitioner (Hematology and Oncology)  DIAGNOSIS:  Encounter Diagnosis  Name Primary?  . Malignant neoplasm of upper-inner quadrant of right breast in female, estrogen receptor negative ([place])     SUMMARY OF ONCOLOGIC HISTORY:   Breast cancer of upper-inner quadrant of right female breast ([place])   02/29/2016 Initial Diagnosis    Right breast palpable mass (with silicone implants 0626), 3.5 cm on MRI, additional 3 cm anterior linear enhancement? 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 Ki-67 60%, T2 N0 stage 2A clinical stage    03/14/2016 Procedure    Right breast biopsy upper inner quadrant: IDC grade 3    03/28/2016 -  Neo-Adjuvant Chemotherapy    Neoadjuvant chemotherapy with dose dense Adriamycin and Cytoxan followed by Abraxane weekly 5 ( patient is diabetic and cannot take steroids)    07/15/2016 Breast MRI    Right breast: Spiculated mass 1.5 cm significantly smaller compared to prior,NME previously seen is not noted, no abnormal lymph nodes    09/13/2016 Surgery    Removal of the silicone implant due to intracapsular rupture and capsulectomy (Dr.Thimappa)    09/13/2016 Surgery    Right lumpectomy: IDC grade 3, 2 foci, 2 cm and 1.1 cm, 0/3 lymph nodes negative margins negative, ER 0%, PR 0%, HER-2 negative ratio 1.02, Ki-67 60%, RCB-II; ypT2ypN0 Stage 2A     10/20/2016 - 12/06/2016 Radiation Therapy    Adjuvant radiation therapy    12/30/2016 - 04/05/2017 Chemotherapy    Xeloda 1000 mg by mouth twice a day adjuvant therapy x 4 cycles     05/03/2017 - 05/24/2017 Chemotherapy    SWOG S 1418 Pembrolizumab on clinical trial stopped after 2 doses by patient preference (not due to toxicities .)     CHIEF COMPLIANT: Follow-up on clinical trial SWOG S14 18  INTERVAL HISTORY: Marissa Hall is a 77 year old with above-mentioned history of triple negative breast cancer is currently on clinical trials SWOG S 1418.  She is currently on observation.  She reports ongoing problems with aches and pains.  She is going through severe financial distress and is going through a lawsuit with her ex-husband.  Because of these aches and pains she has missed last appointment.  She continues to have neuropathy sometimes it is worse sometimes it is better.  REVIEW OF SYSTEMS:   Constitutional: Denies fevers, chills or abnormal weight loss Eyes: Denies blurriness of vision Ears, nose, mouth, throat, and face: Denies mucositis or sore throat Respiratory: Denies cough, dyspnea or wheezes Cardiovascular: Denies palpitation, chest discomfort Gastrointestinal:  Denies nausea, heartburn or change in bowel habits Skin: Denies abnormal skin rashes Lymphatics: Denies new lymphadenopathy or easy bruising Neurological:Denies numbness, tingling or new weaknesses Behavioral/Psych: Mood is stable, no new changes  Extremities: No lower extremity edema All other systems were reviewed with the patient and are negative.  I have reviewed the past medical history, past surgical history, social history and family history with the patient and they are unchanged from previous note.  ALLERGIES:  is allergic to other and penicillins.  MEDICATIONS:  Current Outpatient Medications  Medication Sig Dispense Refill  . atorvastatin (LIPITOR) 80 MG tablet Take 80  mg by mouth every evening.     [name] Kitchen azelastine (ASTELIN) 0.1 % nasal spray Place 2 sprays into both nostrils daily as needed for rhinitis or allergies. Use in each nostril as directed    . levothyroxine (SYNTHROID,  LEVOTHROID) 112 MCG tablet Take 1 tablet by mouth daily.    [name] Kitchen lisinopril (PRINIVIL,ZESTRIL) 20 MG tablet Take 20 mg by mouth every evening.     . metFORMIN (GLUCOPHAGE-XR) 500 MG 24 hr tablet Take 1,000 mg by mouth daily.     [name] Kitchen omeprazole (PRILOSEC) 40 MG capsule Take 1 capsule (40 mg total) by mouth daily. (Patient taking differently: Take 40 mg as needed by mouth (Heart Burn). ) 30 capsule 2  . pyridoxine (B-6) 100 MG tablet Take 100 mg by mouth daily.     . traZODone (DESYREL) 50 MG tablet 1-3 tabs po qhs prn insomnia 60 tablet 1   No current facility-administered medications for this visit.     PHYSICAL EXAMINATION: ECOG PERFORMANCE STATUS: 1 - Symptomatic but completely ambulatory  Vitals:   02/22/18 1510  BP: (!) 172/82  Pulse: 60  Resp: 19  Temp: 98.4 F (36.9 C)  SpO2: 100%   Filed Weights   02/22/18 1510  Weight: 175 lb 8 oz (79.6 kg)    GENERAL:alert, no distress and comfortable SKIN: skin color, texture, turgor are normal, no rashes or significant lesions EYES: normal, Conjunctiva are pink and non-injected, sclera clear OROPHARYNX:no exudate, no erythema and lips, buccal mucosa, and tongue normal  NECK: supple, thyroid normal size, non-tender, without nodularity LYMPH:  no palpable lymphadenopathy in the cervical, axillary or inguinal LUNGS: clear to auscultation and percussion with normal breathing effort HEART: regular rate & rhythm and no murmurs and no lower extremity edema ABDOMEN:abdomen soft, non-tender and normal bowel sounds MUSCULOSKELETAL:no cyanosis of digits and no clubbing  NEURO: alert & oriented x 3 with fluent speech, no focal motor/sensory deficits EXTREMITIES: No lower extremity edema   LABORATORY DATA:  I have reviewed the data as listed CMP Latest Ref Rng & Units 02/22/2018 12/01/2017 09/06/2017  Glucose 70 - 99 mg/dL 153(H) 163(H) 177(H)  BUN 8 - 23 mg/dL '15 15 19  ' Creatinine 0.44 - 1.00 mg/dL 0.79 0.77 0.83  Sodium 135 - 145 mmol/L 140 140  135(L)  Potassium 3.5 - 5.1 mmol/L 4.6 4.8 4.3  Chloride 98 - 111 mmol/L 105 105 102  CO2 22 - 32 mmol/L '28 28 26  ' Calcium 8.9 - 10.3 mg/dL 9.5 9.9 9.3  Total Protein 6.5 - 8.1 g/dL 7.2 7.2 6.7  Total Bilirubin 0.3 - 1.2 mg/dL 1.0 1.3(H) 0.9  Alkaline Phos 38 - 126 U/L 74 63 73  AST 15 - 41 U/L 28 56(H) 62(H)  ALT 0 - 44 U/L 36 54(H) 48    Lab Results  Component Value Date   WBC 3.7 (L) 02/22/2018   HGB 12.7 02/22/2018   HCT 37.8 02/22/2018   MCV 99.4 02/22/2018   PLT 141 (L) 02/22/2018   NEUTROABS 2.0 02/22/2018    ASSESSMENT & PLAN:  Breast cancer of upper-inner quadrant of right female breast ([place]) 02/29/2016: Right breast palpable mass (with silicone implants 0459), 3.5 cm on MRI, additional 3 cm anterior linear enhancement (biopsy 03/14/2016 IDC grade 3); 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  T2 N0 stage 2A clinical stage  Treatment summary: 1. Neoadjuvant chemotherapy with dose dense Adriamycin and Cytoxan 4 followed by Abraxane weekly 5(stopped early due to neuropathy) 2. 09/13/2016: Right lumpectomy: IDC grade  3, 2 foci, 2 cm and 1.1 cm, 0/3 lymph nodes negative margins negative, ER 0%, PR 0%, HER-2 negative ratio 1.02, Ki-67 60%, RCB-II; ypT2ypN0 Stage 2A (with plastic surgery removing the ruptured implant) 3. Followed by radiation therapy completed 12/06/2016 4.Adjuvant Xeloda 1000 mg by mouth twice a day 2 weeks on one week off 12/30/2016-Oct 2018 5.SWOG 1418 clinical trial Pembrolizumabcycle 1 given 05/03/2017(patient decided to discontinue clinical trial) -----------------------------------------------------------------------------------------------------------------------------------------  Surveillance: 1.Breast exam 02/21/2018: Benign 2.mammogram 03/24/2017 at [facility]: No evidence of malignancy. Breast density category B Return to clinic in 6 months for follow-up and labs.  Severe financial distress: Due to patient going through a lawsuit with  her ex-husband who has not met his financial obligations to her.  No orders of the defined types were placed in this encounter.  The patient has a good understanding of the overall plan. she agrees with it. she will call with any problems that may develop before the next visit here.   Harriette Ohara, MD 02/22/18

## 2018-02-23 ENCOUNTER — Telehealth: Payer: Self-pay | Admitting: Hematology and Oncology

## 2018-02-23 NOTE — Progress Notes (Signed)
OFF EARLY CYCLE 5 VISIT FOR E4235 CLINICAL TRIAL. Research nurse greeted patient in lobby and accompanied her to lab and then exam room for MD visit. Concomitant medicationsreviewedwith patient;  Patient reports she restarted Trazodone to help her sleep.  No other changes or new medications per patient report.   AEs reviewed with patient; 1.Back pain is worse than last visit.  Patient has seen PCP for this and also taking Advil PRN once or twice a day.  She has been referred to physical therapy and has appointment to start next week.   2. Neuropathy at baseline, feels worse at times with accompanying back pain , but patient doesn't want to take any medication for this. It does not impair any functionor ADLs. 3. Headaches are intermittent, patient states she feels due to stress.  She does not take any medication for them.  4. Patient complain of insomnia due to stress.  5. Patient reports she has had a left groin mass for over 50 years. It has slowly been growing and over the past 2 months has become bothersome and sore so she is scheduled to have it surgically removed next week. She says she has been told it is a Lipoma.  5. Fatigue is ongoing and does not interfere with any personal or instrumental ADLs. H&P, PS, Toxicity and Recurrence assessment completed by Dr. Lindi Adie. Thanked patient very much for her time today andparticipation in this study. Her next study visit is due for End of Treatment visit on 05/21/18.  She will then be scheduled every 6 months for the next 4 years per protocol.  Thanked patient very much for her ongoing participation in this clinical trial. Asked her to call research nurse if any questions or problems prior to next appointment on 05/21/18.  She verbalized understanding.   AEsCycle4; 12/01/17-02/22/18 Event Grade Onset Date End Date Status Comment Attribution toPembrolizumab Immune Related Radiation Related  Back Pain 1 05/30/17 12/15/17 increased  Intermittent prior to starting on study Unlikely No No  Back Pain 2 12/15/17  ongoing increased unrelated no no  Insomnia  2 02/09/18  ongoing new unrelated no no  HTN 3 02/22/18  ongoing increased unrelated no no  Decreased WBC 1 02/22/18  ongoing At baseline- recurred unrelated no no  Decreased Platelets 1 02/22/18  ongoing At baseline - recurred unrelated no no  Left Groin mass 2 12/24/17  ongoing At baseline, worse in past 2 months unrelated no no  Fatigue 1 02/09/18  ongoing Baseline, intermittent unrelated no no   Foye Spurling, BSN, RN Clinical Research Nurse 02/22/18 4:00 PM

## 2018-02-23 NOTE — Telephone Encounter (Signed)
Mailed pt calendar of upcoming appts

## 2018-02-27 ENCOUNTER — Other Ambulatory Visit: Payer: Self-pay

## 2018-02-27 ENCOUNTER — Encounter (HOSPITAL_BASED_OUTPATIENT_CLINIC_OR_DEPARTMENT_OTHER)
Admission: RE | Admit: 2018-02-27 | Discharge: 2018-02-27 | Disposition: A | Discharge status: Home / Self Care | Payer: Medicare Other | Source: Ambulatory Visit | Attending: Surgery | Admitting: Surgery

## 2018-02-27 ENCOUNTER — Ambulatory Visit: Payer: Medicare Other | Attending: Family Medicine | Admitting: Physical Therapy

## 2018-02-27 DIAGNOSIS — Z87891 Personal history of nicotine dependence: Secondary | ICD-10-CM | POA: Diagnosis not present

## 2018-02-27 DIAGNOSIS — I10 Essential (primary) hypertension: Secondary | ICD-10-CM | POA: Diagnosis not present

## 2018-02-27 DIAGNOSIS — R293 Abnormal posture: Secondary | ICD-10-CM | POA: Diagnosis not present

## 2018-02-27 DIAGNOSIS — M25551 Pain in right hip: Secondary | ICD-10-CM | POA: Insufficient documentation

## 2018-02-27 DIAGNOSIS — Z7984 Long term (current) use of oral hypoglycemic drugs: Secondary | ICD-10-CM | POA: Diagnosis not present

## 2018-02-27 DIAGNOSIS — F419 Anxiety disorder, unspecified: Secondary | ICD-10-CM | POA: Diagnosis not present

## 2018-02-27 DIAGNOSIS — M6281 Muscle weakness (generalized): Secondary | ICD-10-CM | POA: Insufficient documentation

## 2018-02-27 DIAGNOSIS — M533 Sacrococcygeal disorders, not elsewhere classified: Secondary | ICD-10-CM | POA: Diagnosis not present

## 2018-02-27 DIAGNOSIS — E039 Hypothyroidism, unspecified: Secondary | ICD-10-CM | POA: Diagnosis not present

## 2018-02-27 DIAGNOSIS — R2242 Localized swelling, mass and lump, left lower limb: Secondary | ICD-10-CM | POA: Diagnosis present

## 2018-02-27 DIAGNOSIS — Z791 Long term (current) use of non-steroidal anti-inflammatories (NSAID): Secondary | ICD-10-CM | POA: Diagnosis not present

## 2018-02-27 DIAGNOSIS — Z8673 Personal history of transient ischemic attack (TIA), and cerebral infarction without residual deficits: Secondary | ICD-10-CM | POA: Diagnosis not present

## 2018-02-27 DIAGNOSIS — Z79899 Other long term (current) drug therapy: Secondary | ICD-10-CM | POA: Diagnosis not present

## 2018-02-27 DIAGNOSIS — Z79890 Hormone replacement therapy (postmenopausal): Secondary | ICD-10-CM | POA: Diagnosis not present

## 2018-02-27 DIAGNOSIS — F329 Major depressive disorder, single episode, unspecified: Secondary | ICD-10-CM | POA: Diagnosis not present

## 2018-02-27 DIAGNOSIS — L720 Epidermal cyst: Secondary | ICD-10-CM | POA: Diagnosis not present

## 2018-02-27 DIAGNOSIS — E119 Type 2 diabetes mellitus without complications: Secondary | ICD-10-CM | POA: Diagnosis not present

## 2018-02-27 NOTE — Therapy (Signed)
[place], [place], [address] Phone: [phone]   Fax:  [phone]  Physical Therapy Evaluation  Patient Details  Name: Marissa Hall MRN: [id number] Date of Birth: 01-24-1941 Referring Provider: Dr. Shawnie Dapper   Encounter Date: 02/27/2018  PT End of Session - 02/27/18 1242    Visit Number  1    Number of Visits  12    Date for PT Re-Evaluation  04/10/18    PT Start Time  1150    PT Stop Time  1248    PT Time Calculation (min)  58 min    Activity Tolerance  Patient tolerated treatment well    Behavior During Therapy  [facility] for tasks assessed/performed       Past Medical History:  Diagnosis Date  . Anxiety and depression 1964   oncologist started duloxetine 12/2016  . Breast cancer ([place]) 03/14/2016   Clinical stage 2A: (triple neg): Right breast, upper inner quadrant, 03/2016.  Neoadjuvant chemo x 5 cycles, then lumpectomy 4 mo later, then radiation started 10/2016.  Adjuvant Xeloda Aug through october 2018.  SWOG research trial pt 04/2017--pt randomized to pembrolizumab immunotherapy.  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  . Cataracts, bilateral 07/2017  . 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 Depression 1964   Patient states since age 37  . Diabetes ([place]) 2008   managed by endocrinology.  [name] Kitchen GERD (gastroesophageal reflux disease) 2013  . Hyperlipidemia 1986  . Hypertension 2008  . Hypothyroidism 1988   Diagnosed in her 50s.  Managed by Endocrinologist  . Osteoporosis 2015   pt states "osteopenia", but then says that she refused to take the rx med for this condition, so I suspect she had osteoporosis.  . Peripheral neuropathy 2017   Patient states diabetic neuropathy in feet prior to starting chemotherapy and then worsened by chemo.   [name] Kitchen TIA (transient ischemic attack)  03/26/2011    Past Surgical History:  Procedure Laterality Date  . ABDOMINAL HYSTERECTOMY  1972  . APPENDECTOMY  1972  . BREAST ENHANCEMENT SURGERY  1982  . BREAST IMPLANT REMOVAL Right 09/13/2016   Procedure: REMOVAL RIGHT BREAST IMPLANT;  Surgeon: Irene Limbo, MD;  Location: [place];  Service: Plastics;  Laterality: Right;  . BREAST LUMPECTOMY WITH RADIOACTIVE SEED AND SENTINEL LYMPH NODE BIOPSY Right 09/13/2016   Procedure: RIGHT BREAST LUMPECTOMY WITH RADIOACTIVE SEED X 2 AND SENTINEL LYMPH NODE BIOPSY;  Surgeon: Alphonsa Overall, MD;  Location: [place];  Service: General;  Laterality: Right;  . BREAST SURGERY Right 03/14/2016   Biopsy  . CAPSULECTOMY Right 09/13/2016   Procedure: RIGHT CAPSULECTOMY;  Surgeon: Irene Limbo, MD;  Location: [place];  Service: Plastics;  Laterality: Right;  . CATARACT EXTRACTION, BILATERAL Bilateral 08/10/17 right eye, 08/31/17 left eye  . PORTACATH PLACEMENT N/A 03/15/2016   Procedure: INSERTION PORT-A-CATH WITH [place];  Surgeon: Alphonsa Overall, MD;  Location: [facility];  Service: General;  Laterality: N/A;  . PORTACATH REMOVAL  07/2017  . surgical repair left ankle Left 2009   s/p Fall   . TONSILLECTOMY AND ADENOIDECTOMY  1948   Age 83    There were no vitals filed for this visit.   Subjective Assessment - 02/27/18 1154    Subjective  This has been going on since July 2019.  Some days pain is worse than others.  Pain is constant, points to Rt buttock.  She does not feel like she is as steady as she would like to be. Feels less confident with balance.  She reports back weakness, hard to stand up tall and walks forward.  She has 3 dogs and walks them every morning, this has limited her abilty maintain control of the dogs and her balance.  She sometimes feels her Rt foot catch and stumble.     Pertinent History  having lipoma removed tomorrow,  LLE ankle surgery, diabetes , breast cancer, osteopenia, diabetes  and neuropathy     Limitations  Walking;Standing;Lifting;Sitting;Other (comment);House hold activities   sleeping    How long can you sit comfortably?  30 min     How long can you stand comfortably?  cooked for about 3-4 hours and felt OK, but incr pain the next day     How long can you walk comfortably?  30 min (start and stop)    Diagnostic tests  none     Patient Stated Goals  Patient wants to be able to feel a little more comfortable.     Currently in Pain?  Yes    Pain Score  6     Pain Location  Hip    Pain Orientation  Right    Pain Descriptors / Indicators  Aching;Squeezing    Pain Type  Acute pain    Pain Onset  More than a month ago    Pain Frequency  Intermittent    Aggravating Factors   sitting , sleeping    Pain Relieving Factors  change position, Advil, stretching     Effect of Pain on Daily Activities  limits her mobility          [facility] PT Assessment - 02/27/18 0001      Assessment   Medical Diagnosis  sacroiliac pain     Referring Provider  Dr. Shawnie Dapper    Onset Date/Surgical Date  --   July 2019    Next MD Visit  not sure     Prior Therapy  Yes but not for this issue       Precautions   Precautions  None      Restrictions   Weight Bearing Restrictions  No      Balance Screen   Has the patient fallen in the past 6 months  No    Has the patient had a decrease in activity level because of a fear of falling?   Yes   due to pain    Is the patient reluctant to leave their home because of a fear of falling?   No      Home Environment   Living Environment  Private residence    Living Arrangements  Alone    Type of [place] to enter    Entrance Stairs-Number of Steps  3     Entrance Stairs-Rails  Right    Home Layout  One level;Laundry or work area in basement      Prior Function   Level of Independence  Independent    Vocation  Part time employment    Herbalist     Leisure  dogs, used to do more  with them, quilting, shopping       Cognition   Overall Cognitive Status  Within Functional Limits for tasks assessed      Observation/Other Assessments   Focus on Therapeutic Outcomes (FOTO)  44%      Sensation   Light Touch  Impaired by gross assessment    Additional Comments  neuropathy      Coordination   Gross Motor Movements are Fluid and Coordinated  Not tested      Functional Tests   Functional tests  Squat;Single leg stance      Squat   Comments  good form , no pain       Single Leg Stance   Comments  WFL with 2-3 tries, moved quickly at first       Posture/Postural Control   Posture/Postural Control  Postural limitations    Postural Limitations  Rounded Shoulders;Forward head;Increased thoracic kyphosis;Right pelvic obliquity    Posture Comments  high Rt hip       AROM   Overall AROM Comments  WFL      PROM   Overall PROM Comments  hip WFL       Strength   Overall Strength Comments  WFL bilateral knees     Right Hip Extension  4/5    Right Hip ABduction  4/5    Left Hip Extension  4+/5    Left Hip ABduction  4+/5      Flexibility   Soft Tissue Assessment /Muscle Length  yes    Hamstrings  tight R>L     Quadriceps  tight bilateral and hip flexors    Piriformis  tight R>L.       Palpation   Palpation comment  TTP, pain along R lateral border of SIJ and into Piriformis , tight bands , pain along Rt greater trochanter.  Not tender in lumbar spine or glute medius       Transfers   Comments  moves easily and somewhat quickly                 Objective measurements completed on examination: See above findings.      [facility] Adult PT Treatment/Exercise - 02/27/18 0001      Self-Care   Self-Care  Lifting;Posture;Heat/Ice Application;Other Self-Care Comments    Other Self-Care Comments   HEP, POC       Lumbar Exercises: Stretches   Active Hamstring Stretch  1 rep    Active Hamstring Stretch Limitations  HEP    Lower Trunk Rotation  5 reps     Lower Trunk Rotation Limitations  HEP     Piriformis Stretch  1 rep    Piriformis Stretch Limitations  HEP supine     Figure 4 Stretch  1 rep    Figure 4 Stretch Limitations  seated       Modalities   Modalities  Moist Heat      Moist Heat Therapy   Number Minutes Moist Heat  10 Minutes    Moist Heat Location  Lumbar Spine;Hip             PT Education - 02/27/18 1311    Education Details  PT/POC, HEP, anatomy, dry needling     Person(s) Educated  Patient    Methods  Explanation;Demonstration;Handout    Comprehension  Verbalized understanding;Returned demonstration          PT Long Term Goals - 02/27/18 1312      PT LONG TERM GOAL #1   Title  Pt will be I with HEP for flexibility and strength     Time  6    Period  Weeks    Status  New    Target Date  04/10/18  PT LONG TERM GOAL #2   Title  Pt will complete a balance screen and have a goal set for balance if warranted.     Time  6    Period  Weeks    Status  New    Target Date  04/10/18      PT LONG TERM GOAL #3   Title  Pt will be able to sit for 45 min for meals, rest without increasing pain in Rt buttocks.     Time  6    Period  Weeks    Status  New    Target Date  04/10/18      PT LONG TERM GOAL #4   Title  Pt will become more aware of posture, lifting as she completes ADLs, work tasks (sitting less) and walks    Time  6    Period  Weeks    Status  New    Target Date  04/10/18      PT LONG TERM GOAL #5   Title  Pt will be able to sleep with no more than min disturbance of pain from low back.     Time  6    Period  Weeks    Status  New    Target Date  04/10/18             Plan - 02/27/18 1244    Clinical Impression Statement  Pt presents for low complexity eval of bilateral sacral pain which has been an issue for <3 mos.  She may have strained her back as she was moving from another city at that time (lifting, packing  boxes)  She has pain, tightness in bilateral piriformis (R>L) and  min weakness in hips.  She also complains of feeling unsteady and off balance as she walks her dogs, walks in the community.  She wiull benefit from skilled PT to help prevent further issues with mobility.     Clinical Presentation  Stable    Clinical Decision Making  Low    Rehab Potential  Excellent    PT Frequency  2x / week    PT Duration  6 weeks    PT Treatment/Interventions  ADLs/Self Care Home Management;Cryotherapy;Ultrasound;Moist Heat;Balance training;Dry needling;Therapeutic exercise;Patient/family education;Manual techniques;Passive range of motion;Therapeutic activities;Iontophoresis 4mg /ml Dexamethasone;Electrical Stimulation;Neuromuscular re-education;Taping    PT Next Visit Plan  check HEP,show tennis ball , manual/DN to piriformis, core , balance screen     PT Home Exercise Plan  LTR, hamstring and piriformis 2 ways     Consulted and Agree with Plan of Care  Patient       Patient will benefit from skilled therapeutic intervention in order to improve the following deficits and impairments:  Decreased balance, Decreased mobility, Impaired sensation, Improper body mechanics, Pain, Postural dysfunction, Impaired flexibility, Impaired UE functional use, Increased fascial restricitons, Decreased strength, Decreased range of motion  Visit Diagnosis: Pain in right hip  Sacroiliac joint pain  Muscle weakness (generalized)  Abnormal posture     Problem List Patient Active Problem List   Diagnosis Date Noted  . Other long term (current) drug therapy 06/14/2017  . Anxiety and depression   . Breast pain, right 10/03/2016  . Port catheter in place 05/09/2016  . Peripheral neuropathy 05/09/2016  . Constipation 04/19/2016  . Breast cancer of upper-inner quadrant of right female breast ([place]) 03/09/2016    Brennin Durfee 02/27/2018, 1:21 PM  [facility] [address] [place], [place], [address] Phone: [phone]  Fax:   [phone]  Name: Marissa Hall MRN: [id number] Date of Birth: 06-11-1940   Raeford Razor, PT 02/27/18 1:21 PM Phone: [phone] Fax: [phone]

## 2018-02-27 NOTE — Progress Notes (Signed)
Ensure pre surgery drink given with instructions to complete by [place], pt verbalized understanding.  EKG and Echo reviewed by Dr. Lanetta Inch, will proceed with surgery as scheduled.

## 2018-02-28 ENCOUNTER — Ambulatory Visit (HOSPITAL_BASED_OUTPATIENT_CLINIC_OR_DEPARTMENT_OTHER): Payer: Medicare Other | Admitting: Anesthesiology

## 2018-02-28 ENCOUNTER — Ambulatory Visit (HOSPITAL_BASED_OUTPATIENT_CLINIC_OR_DEPARTMENT_OTHER)
Admission: RE | Admit: 2018-02-28 | Discharge: 2018-02-28 | Disposition: A | Discharge status: Home / Self Care | Payer: Medicare Other | Source: Ambulatory Visit | Attending: Surgery | Admitting: Surgery

## 2018-02-28 ENCOUNTER — Other Ambulatory Visit: Payer: Self-pay

## 2018-02-28 ENCOUNTER — Encounter (HOSPITAL_BASED_OUTPATIENT_CLINIC_OR_DEPARTMENT_OTHER)
Admission: RE | Disposition: A | Discharge status: Home / Self Care | Payer: Self-pay | Source: Ambulatory Visit | Attending: Surgery

## 2018-02-28 ENCOUNTER — Encounter (HOSPITAL_BASED_OUTPATIENT_CLINIC_OR_DEPARTMENT_OTHER): Payer: Self-pay | Admitting: Anesthesiology

## 2018-02-28 DIAGNOSIS — L723 Sebaceous cyst: Secondary | ICD-10-CM | POA: Diagnosis not present

## 2018-02-28 DIAGNOSIS — Z79890 Hormone replacement therapy (postmenopausal): Secondary | ICD-10-CM | POA: Insufficient documentation

## 2018-02-28 DIAGNOSIS — L720 Epidermal cyst: Secondary | ICD-10-CM | POA: Diagnosis not present

## 2018-02-28 DIAGNOSIS — Z87891 Personal history of nicotine dependence: Secondary | ICD-10-CM | POA: Diagnosis not present

## 2018-02-28 DIAGNOSIS — Z791 Long term (current) use of non-steroidal anti-inflammatories (NSAID): Secondary | ICD-10-CM | POA: Insufficient documentation

## 2018-02-28 DIAGNOSIS — Z7984 Long term (current) use of oral hypoglycemic drugs: Secondary | ICD-10-CM | POA: Insufficient documentation

## 2018-02-28 DIAGNOSIS — L728 Other follicular cysts of the skin and subcutaneous tissue: Secondary | ICD-10-CM | POA: Diagnosis not present

## 2018-02-28 DIAGNOSIS — Z8673 Personal history of transient ischemic attack (TIA), and cerebral infarction without residual deficits: Secondary | ICD-10-CM | POA: Insufficient documentation

## 2018-02-28 DIAGNOSIS — M60252 Foreign body granuloma of soft tissue, not elsewhere classified, left thigh: Secondary | ICD-10-CM | POA: Diagnosis not present

## 2018-02-28 DIAGNOSIS — E039 Hypothyroidism, unspecified: Secondary | ICD-10-CM | POA: Diagnosis not present

## 2018-02-28 DIAGNOSIS — Z79899 Other long term (current) drug therapy: Secondary | ICD-10-CM | POA: Insufficient documentation

## 2018-02-28 DIAGNOSIS — F419 Anxiety disorder, unspecified: Secondary | ICD-10-CM | POA: Insufficient documentation

## 2018-02-28 DIAGNOSIS — E119 Type 2 diabetes mellitus without complications: Secondary | ICD-10-CM | POA: Insufficient documentation

## 2018-02-28 DIAGNOSIS — F329 Major depressive disorder, single episode, unspecified: Secondary | ICD-10-CM | POA: Insufficient documentation

## 2018-02-28 DIAGNOSIS — I10 Essential (primary) hypertension: Secondary | ICD-10-CM | POA: Insufficient documentation

## 2018-02-28 HISTORY — PX: MASS EXCISION: SHX2000

## 2018-02-28 LAB — GLUCOSE, CAPILLARY
GLUCOSE-CAPILLARY: 116 mg/dL — AB (ref 70–99)
Glucose-Capillary: 180 mg/dL — ABNORMAL HIGH (ref 70–99)

## 2018-02-28 SURGERY — EXCISION MASS
Anesthesia: General | Site: Thigh | Laterality: Left

## 2018-02-28 MED ORDER — OXYCODONE HCL 5 MG PO TABS: 5.0000 mg | ORAL_TABLET | Freq: Once | ORAL | Status: DC | PRN

## 2018-02-28 MED ORDER — LIDOCAINE HCL (CARDIAC) PF 100 MG/5ML IV SOSY: PREFILLED_SYRINGE | INTRAVENOUS | Status: DC | PRN

## 2018-02-28 MED ORDER — BUPIVACAINE-EPINEPHRINE 0.25% -1:200000 IJ SOLN
INTRAMUSCULAR | Status: DC | PRN
  Administered 2018-02-28: 10 mL

## 2018-02-28 MED ORDER — ONDANSETRON HCL 4 MG/2ML IJ SOLN
INTRAMUSCULAR | Status: AC
  Filled 2018-02-28: qty 2

## 2018-02-28 MED ORDER — MEPERIDINE HCL 25 MG/ML IJ SOLN: 6.2500 mg | INTRAMUSCULAR | Status: DC | PRN

## 2018-02-28 MED ORDER — DEXAMETHASONE SODIUM PHOSPHATE 10 MG/ML IJ SOLN
INTRAMUSCULAR | Status: AC
  Filled 2018-02-28: qty 1

## 2018-02-28 MED ORDER — FENTANYL CITRATE (PF) 100 MCG/2ML IJ SOLN
INTRAMUSCULAR | Status: DC | PRN
  Administered 2018-02-28: 50 ug via INTRAVENOUS

## 2018-02-28 MED ORDER — CLINDAMYCIN PHOSPHATE 900 MG/50ML IV SOLN
900.0000 mg | INTRAVENOUS | Status: AC
  Administered 2018-02-28: 900 mg via INTRAVENOUS

## 2018-02-28 MED ORDER — LACTATED RINGERS IV SOLN
INTRAVENOUS | Status: DC
  Administered 2018-02-28 (×2): via INTRAVENOUS

## 2018-02-28 MED ORDER — ACETAMINOPHEN 500 MG PO TABS
ORAL_TABLET | ORAL | Status: AC
  Filled 2018-02-28: qty 2

## 2018-02-28 MED ORDER — PROPOFOL 10 MG/ML IV BOLUS
INTRAVENOUS | Status: DC | PRN
  Administered 2018-02-28: 120 mg via INTRAVENOUS
  Administered 2018-02-28: 50 mg via INTRAVENOUS

## 2018-02-28 MED ORDER — CELECOXIB 200 MG PO CAPS
ORAL_CAPSULE | ORAL | Status: AC
  Filled 2018-02-28: qty 1

## 2018-02-28 MED ORDER — FENTANYL CITRATE (PF) 100 MCG/2ML IJ SOLN
INTRAMUSCULAR | Status: AC
  Filled 2018-02-28: qty 2

## 2018-02-28 MED ORDER — IBUPROFEN 800 MG PO TABS: 800.0000 mg | ORAL_TABLET | Freq: Three times a day (TID) | ORAL | 0 refills | Status: DC | PRN

## 2018-02-28 MED ORDER — CELECOXIB 200 MG PO CAPS
200.0000 mg | ORAL_CAPSULE | ORAL | Status: AC
  Administered 2018-02-28: 200 mg via ORAL

## 2018-02-28 MED ORDER — SCOPOLAMINE 1 MG/3DAYS TD PT72: 1.0000 | MEDICATED_PATCH | Freq: Once | TRANSDERMAL | Status: DC | PRN

## 2018-02-28 MED ORDER — GABAPENTIN 300 MG PO CAPS
ORAL_CAPSULE | ORAL | Status: AC
  Filled 2018-02-28: qty 1

## 2018-02-28 MED ORDER — OXYCODONE HCL 5 MG/5ML PO SOLN: 5.0000 mg | Freq: Once | ORAL | Status: DC | PRN

## 2018-02-28 MED ORDER — FENTANYL CITRATE (PF) 100 MCG/2ML IJ SOLN: 25.0000 ug | INTRAMUSCULAR | Status: DC | PRN

## 2018-02-28 MED ORDER — MIDAZOLAM HCL 2 MG/2ML IJ SOLN: 1.0000 mg | INTRAMUSCULAR | Status: DC | PRN

## 2018-02-28 MED ORDER — EPHEDRINE SULFATE 50 MG/ML IJ SOLN
INTRAMUSCULAR | Status: DC | PRN
  Administered 2018-02-28: 10 mg via INTRAVENOUS

## 2018-02-28 MED ORDER — ACETAMINOPHEN 500 MG PO TABS
1000.0000 mg | ORAL_TABLET | ORAL | Status: AC
  Administered 2018-02-28: 1000 mg via ORAL

## 2018-02-28 MED ORDER — PROPOFOL 500 MG/50ML IV EMUL
INTRAVENOUS | Status: AC
  Filled 2018-02-28: qty 50

## 2018-02-28 MED ORDER — FENTANYL CITRATE (PF) 100 MCG/2ML IJ SOLN: 50.0000 ug | INTRAMUSCULAR | Status: DC | PRN

## 2018-02-28 MED ORDER — CHLORHEXIDINE GLUCONATE CLOTH 2 % EX PADS: 6.0000 | MEDICATED_PAD | Freq: Once | CUTANEOUS | Status: DC

## 2018-02-28 MED ORDER — EPHEDRINE 5 MG/ML INJ
INTRAVENOUS | Status: AC
  Filled 2018-02-28: qty 10

## 2018-02-28 MED ORDER — LIDOCAINE HCL (CARDIAC) PF 100 MG/5ML IV SOSY
PREFILLED_SYRINGE | INTRAVENOUS | Status: DC | PRN
  Administered 2018-02-28: 40 mg via INTRAVENOUS

## 2018-02-28 MED ORDER — ONDANSETRON HCL 4 MG/2ML IJ SOLN
INTRAMUSCULAR | Status: DC | PRN
  Administered 2018-02-28: 4 mg via INTRAVENOUS

## 2018-02-28 MED ORDER — CLINDAMYCIN PHOSPHATE 900 MG/50ML IV SOLN
INTRAVENOUS | Status: AC
  Filled 2018-02-28: qty 50

## 2018-02-28 MED ORDER — LIDOCAINE 2% (20 MG/ML) 5 ML SYRINGE
INTRAMUSCULAR | Status: AC
  Filled 2018-02-28: qty 5

## 2018-02-28 MED ORDER — DEXAMETHASONE SODIUM PHOSPHATE 4 MG/ML IJ SOLN
INTRAMUSCULAR | Status: DC | PRN
  Administered 2018-02-28: 10 mg via INTRAVENOUS

## 2018-02-28 MED ORDER — GABAPENTIN 300 MG PO CAPS
300.0000 mg | ORAL_CAPSULE | ORAL | Status: AC
  Administered 2018-02-28: 300 mg via ORAL

## 2018-02-28 SURGICAL SUPPLY — 41 items
BENZOIN TINCTURE PRP APPL 2/3 (GAUZE/BANDAGES/DRESSINGS) IMPLANT
BLADE SURG 10 STRL SS (BLADE) IMPLANT
BLADE SURG 15 STRL LF DISP TIS (BLADE) ×1 IMPLANT
BLADE SURG 15 STRL SS (BLADE) ×1
CANISTER SUCT 1200ML W/VALVE (MISCELLANEOUS) IMPLANT
CHLORAPREP W/TINT 26ML (MISCELLANEOUS) ×2 IMPLANT
COVER BACK TABLE 60X90IN (DRAPES) ×2 IMPLANT
COVER MAYO STAND STRL (DRAPES) ×2 IMPLANT
DECANTER SPIKE VIAL GLASS SM (MISCELLANEOUS) IMPLANT
DERMABOND ADVANCED (GAUZE/BANDAGES/DRESSINGS) ×1
DERMABOND ADVANCED .7 DNX12 (GAUZE/BANDAGES/DRESSINGS) ×1 IMPLANT
DRAPE LAPAROTOMY 100X72 PEDS (DRAPES) ×2 IMPLANT
DRAPE UTILITY XL STRL (DRAPES) ×2 IMPLANT
ELECT COATED BLADE 2.86 ST (ELECTRODE) ×2 IMPLANT
ELECT REM PT RETURN 9FT ADLT (ELECTROSURGICAL) ×2
ELECTRODE REM PT RTRN 9FT ADLT (ELECTROSURGICAL) ×1 IMPLANT
GLOVE BIO SURGEON STRL SZ 6.5 (GLOVE) ×2 IMPLANT
GLOVE BIOGEL PI IND STRL 7.0 (GLOVE) ×1 IMPLANT
GLOVE BIOGEL PI IND STRL 8 (GLOVE) ×2 IMPLANT
GLOVE BIOGEL PI INDICATOR 7.0 (GLOVE) ×1
GLOVE BIOGEL PI INDICATOR 8 (GLOVE) ×2
GLOVE ECLIPSE 8.0 STRL XLNG CF (GLOVE) ×2 IMPLANT
GOWN STRL REUS W/ TWL LRG LVL3 (GOWN DISPOSABLE) ×2 IMPLANT
GOWN STRL REUS W/TWL LRG LVL3 (GOWN DISPOSABLE) ×2
NEEDLE HYPO 25X1 1.5 SAFETY (NEEDLE) ×2 IMPLANT
NS IRRIG 1000ML POUR BTL (IV SOLUTION) IMPLANT
PACK BASIN DAY SURGERY FS (CUSTOM PROCEDURE TRAY) ×2 IMPLANT
PACK UNIVERSAL I (CUSTOM PROCEDURE TRAY) ×2 IMPLANT
PENCIL BUTTON HOLSTER BLD 10FT (ELECTRODE) ×2 IMPLANT
SLEEVE SCD COMPRESS KNEE MED (MISCELLANEOUS) ×2 IMPLANT
SPONGE LAP 4X18 RFD (DISPOSABLE) ×2 IMPLANT
STAPLER VISISTAT 35W (STAPLE) IMPLANT
STRIP CLOSURE SKIN 1/2X4 (GAUZE/BANDAGES/DRESSINGS) IMPLANT
SUT MON AB 4-0 PC3 18 (SUTURE) ×2 IMPLANT
SUT VICRYL 3-0 CR8 SH (SUTURE) ×2 IMPLANT
SUT VICRYL AB 3 0 TIES (SUTURE) IMPLANT
SYR CONTROL 10ML LL (SYRINGE) ×2 IMPLANT
TOWEL GREEN STERILE FF (TOWEL DISPOSABLE) ×4 IMPLANT
TOWEL OR NON WOVEN STRL DISP B (DISPOSABLE) ×2 IMPLANT
TUBE CONNECTING 20X1/4 (TUBING) ×2 IMPLANT
YANKAUER SUCT BULB TIP NO VENT (SUCTIONS) ×2 IMPLANT

## 2018-02-28 NOTE — Discharge Instructions (Signed)
GENERAL SURGERY: POST OP INSTRUCTIONS ° °###################################################################### ° °EAT °Gradually transition to a high fiber diet with a fiber supplement over the next few weeks after discharge.  Start with a pureed / full liquid diet (see below) ° °WALK °Walk an hour a day.  Control your pain to do that.   ° °CONTROL PAIN °Control pain so that you can walk, sleep, tolerate sneezing/coughing, go up/down stairs. ° °HAVE A BOWEL MOVEMENT DAILY °Keep your bowels regular to avoid problems.  OK to try a laxative to override constipation.  OK to use an antidairrheal to slow down diarrhea.  Call if not better after 2 tries ° °CALL IF YOU HAVE PROBLEMS/CONCERNS °Call if you are still struggling despite following these instructions. °Call if you have concerns not answered by these instructions ° °###################################################################### ° ° ° °1. DIET: Follow a light bland diet the first 24 hours after arrival home, such as soup, liquids, crackers, etc.  Be sure to include lots of fluids daily.  Avoid fast food or heavy meals as your are more likely to get nauseated.   °2. Take your usually prescribed home medications unless otherwise directed. °3. PAIN CONTROL: °a. Pain is best controlled by a usual combination of three different methods TOGETHER: °i. Ice/Heat °ii. Over the counter pain medication °iii. Prescription pain medication °b. Most patients will experience some swelling and bruising around the incisions.  Ice packs or heating pads (30-60 minutes up to 6 times a day) will help. Use ice for the first few days to help decrease swelling and bruising, then switch to heat to help relax tight/sore spots and speed recovery.  Some people prefer to use ice alone, heat alone, alternating between ice & heat.  Experiment to what works for you.  Swelling and bruising can take several weeks to resolve.   °c. It is helpful to take an over-the-counter pain medication  regularly for the first few weeks.  Choose one of the following that works best for you: °i. Naproxen (Aleve, etc)  Two 220mg tabs twice a day °ii. Ibuprofen (Advil, etc) Three 200mg tabs four times a day (every meal & bedtime) °iii. Acetaminophen (Tylenol, etc) 500-650mg four times a day (every meal & bedtime) °d. A  prescription for pain medication (such as oxycodone, hydrocodone, etc) should be given to you upon discharge.  Take your pain medication as prescribed.  °i. If you are having problems/concerns with the prescription medicine (does not control pain, nausea, vomiting, rash, itching, etc), please call us [phone] to see if we need to switch you to a different pain medicine that will work better for you and/or control your side effect better. °ii. If you need a refill on your pain medication, please contact your pharmacy.  They will contact our office to request authorization. Prescriptions will not be filled after 5 pm or on week-ends. °4. Avoid getting constipated.  Between the surgery and the pain medications, it is common to experience some constipation.  Increasing fluid intake and taking a fiber supplement (such as Metamucil, Citrucel, FiberCon, MiraLax, etc) 1-2 times a day regularly will usually help prevent this problem from occurring.  A mild laxative (prune juice, Milk of Magnesia, MiraLax, etc) should be taken according to package directions if there are no bowel movements after 48 hours.   °5. Wash / shower every day.  You may shower over the dressings as they are waterproof.  Continue to shower over incision(s) after the dressing is off. °6. Remove your waterproof bandages   5 days after surgery.  You may leave the incision open to air.  You may have skin tapes (Steri Strips) covering the incision(s).  Leave them on until one week, then remove.  You may replace a dressing/Band-Aid to cover the incision for comfort if you wish.  ° ° ° ° °7. ACTIVITIES as tolerated:   °a. You may resume  regular (light) daily activities beginning the next day--such as daily self-care, walking, climbing stairs--gradually increasing activities as tolerated.  If you can walk 30 minutes without difficulty, it is safe to try more intense activity such as jogging, treadmill, bicycling, low-impact aerobics, swimming, etc. °b. Save the most intensive and strenuous activity for last such as sit-ups, heavy lifting, contact sports, etc  Refrain from any heavy lifting or straining until you are off narcotics for pain control.   °c. DO NOT PUSH THROUGH PAIN.  Let pain be your guide: If it hurts to do something, don't do it.  Pain is your body warning you to avoid that activity for another week until the pain goes down. °d. You may drive when you are no longer taking prescription pain medication, you can comfortably wear a seatbelt, and you can safely maneuver your car and apply brakes. °e. You may have sexual intercourse when it is comfortable.  °8. FOLLOW UP in our office °a. Please call [facility] at [phone] to set up an appointment to see your surgeon in the office for a follow-up appointment approximately 2-3 weeks after your surgery. °b. Make sure that you call for this appointment the day you arrive home to insure a convenient appointment time. °9. IF YOU HAVE DISABILITY OR FAMILY LEAVE FORMS, BRING THEM TO THE OFFICE FOR PROCESSING.  DO NOT GIVE THEM TO YOUR DOCTOR. ° ° °WHEN TO CALL US [phone]: °1. Poor pain control °2. Reactions / problems with new medications (rash/itching, nausea, etc)  °3. Fever over 101.5 F (38.5 C) °4. Worsening swelling or bruising °5. Continued bleeding from incision. °6. Increased pain, redness, or drainage from the incision °7. Difficulty breathing / swallowing ° ° The clinic staff is available to answer your questions during regular business hours (8:30am-5pm).  Please don’t hesitate to call and ask to speak to one of our nurses for clinical concerns.  ° If you have a medical emergency,  go to the nearest emergency room or call 911. ° A surgeon from [facility] is always on call at the hospitals ° ° °[facility] °[address], [address], [place], [place]  [address] ? °MAIN: [phone] ? TOLL FREE: [phone] ?  °FAX [phone] °[email or web address] ° ° °Post Anesthesia Home Care Instructions ° °Activity: °Get plenty of rest for the remainder of the day. A responsible individual must stay with you for 24 hours following the procedure.  °For the next 24 hours, DO NOT: °-Drive a car °-Operate machinery °-Drink alcoholic beverages °-Take any medication unless instructed by your physician °-Make any legal decisions or sign important papers. ° °Meals: °Start with liquid foods such as gelatin or soup. Progress to regular foods as tolerated. Avoid greasy, spicy, heavy foods. If nausea and/or vomiting occur, drink only clear liquids until the nausea and/or vomiting subsides. Call your physician if vomiting continues. ° °Special Instructions/Symptoms: °Your throat may feel dry or sore from the anesthesia or the breathing tube placed in your throat during surgery. If this causes discomfort, gargle with warm salt water. The discomfort should disappear within 24 hours. ° °  If you had a scopolamine patch placed behind your ear for the management of post- operative nausea and/or vomiting: ° °1. The medication in the patch is effective for 72 hours, after which it should be removed.  Wrap patch in a tissue and discard in the trash. Wash hands thoroughly with soap and water. °2. You may remove the patch earlier than 72 hours if you experience unpleasant side effects which may include dry mouth, dizziness or visual disturbances. °3. Avoid touching the patch. Wash your hands with soap and water after contact with the patch. °  ° ° °

## 2018-02-28 NOTE — Anesthesia Preprocedure Evaluation (Addendum)
Anesthesia Evaluation  Patient identified by MRN, date of birth, ID band Patient awake    Reviewed: Allergy & Precautions, NPO status , Patient's Chart, lab work & pertinent test results  Airway Mallampati: II  TM Distance: >3 FB Neck ROM: Full    Dental no notable dental hx.    Pulmonary neg pulmonary ROS, former smoker,    Pulmonary exam normal breath sounds clear to auscultation       Cardiovascular hypertension, negative cardio ROS Normal cardiovascular exam Rhythm:Regular Rate:Normal     Neuro/Psych PSYCHIATRIC DISORDERS Anxiety Depression Schizophrenia TIAnegative neurological ROS  negative psych ROS   GI/Hepatic negative GI ROS, Neg liver ROS, GERD  ,  Endo/Other  negative endocrine ROSdiabetes, Type 2, Oral Hypoglycemic AgentsHypothyroidism   Renal/GU negative Renal ROS     Musculoskeletal negative musculoskeletal ROS (+)   Abdominal   Peds  Hematology negative hematology ROS (+)   Anesthesia Other Findings   Reproductive/Obstetrics negative OB ROS                             Anesthesia Physical  Anesthesia Plan  ASA: III  Anesthesia Plan: General   Post-op Pain Management:    Induction: Intravenous  PONV Risk Score and Plan: 3 and Ondansetron, Dexamethasone, Propofol infusion and Treatment may vary due to age or medical condition  Airway Management Planned: LMA  Additional Equipment: None  Intra-op Plan:   Post-operative Plan: Extubation in OR  Informed Consent: I have reviewed the patients History and Physical, chart, labs and discussed the procedure including the risks, benefits and alternatives for the proposed anesthesia with the patient or authorized representative who has indicated his/her understanding and acceptance.   Dental advisory given  Plan Discussed with: CRNA  Anesthesia Plan Comments:        Anesthesia Quick Evaluation

## 2018-02-28 NOTE — Anesthesia Procedure Notes (Signed)
Procedure Name: LMA Insertion Date/Time: 02/28/2018 9:30 AM Performed by: Marrianne Mood, CRNA Pre-anesthesia Checklist: Patient identified, Emergency Drugs available, Suction available, Patient being monitored and Timeout performed Patient Re-evaluated:Patient Re-evaluated prior to induction Oxygen Delivery Method: Circle system utilized Preoxygenation: Pre-oxygenation with 100% oxygen Induction Type: IV induction Ventilation: Mask ventilation without difficulty LMA: LMA inserted LMA Size: 4.0 Number of attempts: 1 Airway Equipment and Method: Bite block Placement Confirmation: positive ETCO2 Tube secured with: Tape Dental Injury: Teeth and Oropharynx as per pre-operative assessment

## 2018-02-28 NOTE — Transfer of Care (Signed)
Immediate Anesthesia Transfer of Care Note  Patient: Marissa Hall  Procedure(s) Performed: EXCISIONLEFT MEDIAL THIGH MASS ERAS PATHWAY (Left Thigh)  Patient Location: PACU  Anesthesia Type:General  Level of Consciousness: awake and patient cooperative  Airway & Oxygen Therapy: Patient Spontanous Breathing and Patient connected to face mask oxygen  Post-op Assessment: Report given to RN and Post -op Vital signs reviewed and stable  Post vital signs: Reviewed and stable  Last Vitals:  Vitals Value Taken Time  BP 129/55 02/28/2018 10:04 AM  Temp    Pulse 73 02/28/2018 10:06 AM  Resp 11 02/28/2018 10:06 AM  SpO2 100 % 02/28/2018 10:06 AM  Vitals shown include unvalidated device data.  Last Pain:  Vitals:   02/28/18 0843  TempSrc: Oral  PainSc: 0-No pain      Patients Stated Pain Goal: 4 (71/95/97 4718)  Complications: No apparent anesthesia complications

## 2018-02-28 NOTE — Op Note (Signed)
Preoperative diagnosis: Medial left thigh cyst measuring 4 cm x 4 cm subcutaneous  Postoperative diagnosis: Same  Procedure excision of medial left thigh cyst  Surgeon: Erroll Luna, MD  Anesthesia: LMA with local  EBL: Minimal  Specimen: Cyst to pathology  Drains: None  IV fluids: Per anesthesia record.  Indications for procedure: The patient presents with a painful left medial thigh cyst near the crease of her left leg.  This was best exposed in lithotomy.  She desired excision due to pain.  Risk, benefits and other options of surgery discussed.The procedure has been discussed with the patient.  Alternative therapies have been discussed with the patient.  Operative risks include bleeding,  Infection,  Organ injury,  Nerve injury,  Blood vessel injury,  DVT,  Pulmonary embolism,  Death,  And possible reoperation.  Medical management risks include worsening of present situation.  The success of the procedure is 50 -90 % at treating patients symptoms.  The patient understands and agrees to proceed.   Description of procedure: The patient was brought to the operative room after marking the spot in the holding area.  Questions were answered.  She is placed supine.  After induction of LMA anesthesia she is placed in lithotomy and the cyst was exposed in the medial left thigh.  This was prepped and draped in sterile fashion timeout was done and she received preoperative antibiotic's.  A 3 cm incision was made over the mass.  This was a large epidermal inclusion cyst and was excised in its entirety without signs of infection.  Wound was irrigated and closed with 3-0 Vicryl and 4-0 Monocryl.  The mass measured approximately 4 cm in maximal diameter.  Right: Dermabond applied.  All final counts found to be correct.  She was taken out of lithotomy extubated taken recovery in satisfactory condition.

## 2018-02-28 NOTE — Interval H&P Note (Signed)
History and Physical Interval Note:  02/28/2018 8:47 AM  Marissa Hall  has presented today for surgery, with the diagnosis of mass  The various methods of treatment have been discussed with the patient and family. After consideration of risks, benefits and other options for treatment, the patient has consented to  Procedure(s) with comments: [place] (Left) - LOCAL as a surgical intervention .  The patient's history has been reviewed, patient examined, no change in status, stable for surgery.  I have reviewed the patient's chart and labs.  Questions were answered to the patient's satisfaction.     [place]

## 2018-03-01 ENCOUNTER — Encounter (HOSPITAL_BASED_OUTPATIENT_CLINIC_OR_DEPARTMENT_OTHER): Payer: Self-pay | Admitting: Surgery

## 2018-03-01 NOTE — Anesthesia Postprocedure Evaluation (Signed)
Anesthesia Post Note  Patient: Marissa Hall  Procedure(s) Performed: EXCISIONLEFT MEDIAL THIGH MASS ERAS PATHWAY (Left Thigh)     Patient location during evaluation: PACU Anesthesia Type: General Level of consciousness: sedated and patient cooperative Pain management: pain level controlled Vital Signs Assessment: post-procedure vital signs reviewed and stable Respiratory status: spontaneous breathing Cardiovascular status: stable Anesthetic complications: no    Last Vitals:  Vitals:   02/28/18 1030 02/28/18 1100  BP: (!) 114/56 (!) 137/55  Pulse: 70 62  Resp: 10 18  Temp:  36.6 C  SpO2: 96% 98%    Last Pain:  Vitals:   02/28/18 1100  TempSrc:   PainSc: 0-No pain                 Nolon Nations

## 2018-03-01 NOTE — Addendum Note (Signed)
Addendum  created 03/01/18 5701 by Tawni Millers, CRNA   Charge Capture section accepted

## 2018-03-07 ENCOUNTER — Encounter: Payer: Self-pay | Admitting: Family Medicine

## 2018-03-12 ENCOUNTER — Encounter: Payer: Self-pay | Admitting: Family Medicine

## 2018-03-12 ENCOUNTER — Encounter: Payer: Self-pay | Admitting: *Deleted

## 2018-03-12 DIAGNOSIS — I10 Essential (primary) hypertension: Secondary | ICD-10-CM | POA: Diagnosis not present

## 2018-03-12 DIAGNOSIS — E039 Hypothyroidism, unspecified: Secondary | ICD-10-CM | POA: Diagnosis not present

## 2018-03-12 DIAGNOSIS — E1140 Type 2 diabetes mellitus with diabetic neuropathy, unspecified: Secondary | ICD-10-CM | POA: Diagnosis not present

## 2018-03-12 DIAGNOSIS — E1165 Type 2 diabetes mellitus with hyperglycemia: Secondary | ICD-10-CM | POA: Diagnosis not present

## 2018-03-12 DIAGNOSIS — E1159 Type 2 diabetes mellitus with other circulatory complications: Secondary | ICD-10-CM | POA: Diagnosis not present

## 2018-03-12 DIAGNOSIS — E1169 Type 2 diabetes mellitus with other specified complication: Secondary | ICD-10-CM | POA: Diagnosis not present

## 2018-03-12 DIAGNOSIS — E785 Hyperlipidemia, unspecified: Secondary | ICD-10-CM | POA: Diagnosis not present

## 2018-03-12 LAB — HEMOGLOBIN A1C: Hemoglobin A1C: 7

## 2018-03-13 ENCOUNTER — Encounter: Payer: Self-pay | Admitting: Physical Therapy

## 2018-03-13 ENCOUNTER — Ambulatory Visit: Payer: Medicare Other | Attending: Family Medicine | Admitting: Physical Therapy

## 2018-03-13 DIAGNOSIS — M25551 Pain in right hip: Secondary | ICD-10-CM | POA: Diagnosis not present

## 2018-03-13 DIAGNOSIS — M6281 Muscle weakness (generalized): Secondary | ICD-10-CM | POA: Diagnosis not present

## 2018-03-13 DIAGNOSIS — M533 Sacrococcygeal disorders, not elsewhere classified: Secondary | ICD-10-CM | POA: Diagnosis not present

## 2018-03-13 DIAGNOSIS — R293 Abnormal posture: Secondary | ICD-10-CM

## 2018-03-13 NOTE — Therapy (Signed)
[place], [place], [address] Phone: [phone]   Fax:  [phone]  Physical Therapy Treatment  Patient Details  Name: Marissa Hall MRN: [id number] Date of Birth: 1941/01/05 Referring Provider (PT): Dr. Shawnie Dapper   Encounter Date: 03/13/2018  PT End of Session - 03/13/18 1600    Visit Number  2    Number of Visits  12    Date for PT Re-Evaluation  04/10/18    PT Start Time  1550    PT Stop Time  1640    PT Time Calculation (min)  50 min    Activity Tolerance  Patient tolerated treatment well    Behavior During Therapy  [facility] for tasks assessed/performed       Past Medical History:  Diagnosis Date  . Anxiety and depression 1964   oncologist started duloxetine 12/2016  . Breast cancer ([place]) 03/14/2016   Clinical stage 2A: (triple neg): Right breast, upper inner quadrant, 03/2016.  Neoadjuvant chemo x 5 cycles, then lumpectomy 4 mo later, then radiation started 10/2016.  Adjuvant Xeloda Aug through october 2018.  SWOG research trial pt 04/2017--pt randomized to pembrolizumab immunotherapy.  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  . Cataracts, bilateral 07/2017  . 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 Depression 1964   Patient states since age 44  . Diabetes ([place]) 2008   managed by endocrinology.  A1c Mar 12, 2018 was 7.0% at Dr. Shirlyn Goltz.  [name] Kitchen GERD (gastroesophageal reflux disease) 2013  . Hyperlipidemia 1986  . Hypertension 2008  . Hypothyroidism 1988   Diagnosed in her 35s.  Managed by Endocrinologist  . Osteoporosis 2015   pt states "osteopenia", but then says that she refused to take the rx med for this condition, so I suspect she had osteoporosis.  . Peripheral neuropathy 2017   Patient states diabetic neuropathy in feet prior to starting chemotherapy and then worsened by  chemo.   [name] Kitchen TIA (transient ischemic attack) 03/26/2011    Past Surgical History:  Procedure Laterality Date  . ABDOMINAL HYSTERECTOMY  1972  . APPENDECTOMY  1972  . BREAST ENHANCEMENT SURGERY  1982  . BREAST IMPLANT REMOVAL Right 09/13/2016   Procedure: REMOVAL RIGHT BREAST IMPLANT;  Surgeon: Irene Limbo, MD;  Location: [facility];  Service: Plastics;  Laterality: Right;  . BREAST LUMPECTOMY WITH RADIOACTIVE SEED AND SENTINEL LYMPH NODE BIOPSY Right 09/13/2016   Procedure: RIGHT BREAST LUMPECTOMY WITH RADIOACTIVE SEED X 2 AND SENTINEL LYMPH NODE BIOPSY;  Surgeon: Alphonsa Overall, MD;  Location: [facility];  Service: General;  Laterality: Right;  . BREAST SURGERY Right 03/14/2016   Biopsy  . CAPSULECTOMY Right 09/13/2016   Procedure: RIGHT CAPSULECTOMY;  Surgeon: Irene Limbo, MD;  Location: [facility];  Service: Plastics;  Laterality: Right;  . CATARACT EXTRACTION, BILATERAL Bilateral 08/10/17 right eye, 08/31/17 left eye  . MASS EXCISION Left 02/28/2018   Path: benign.  Procedure: EXCISIONLEFT MEDIAL THIGH MASS ERAS PATHWAY;  Surgeon: Erroll Luna, MD;  Location: [facility];  Service: General;  Laterality: Left;  . PORTACATH PLACEMENT N/A 03/15/2016   Procedure: INSERTION PORT-A-CATH WITH [place];  Surgeon: Alphonsa Overall, MD;  [facility];  Service: General;  Laterality: N/A;  . PORTACATH REMOVAL  07/2017  . surgical repair left ankle Left 2009   s/p Fall   . TONSILLECTOMY AND  ADENOIDECTOMY  1948   Age 77    There were no vitals filed for this visit.  Subjective Assessment - 03/13/18 1552    Subjective  Patient had surgery 9/25 to remove a cyst.  Pain has not changed much.  Yardwork Saturday really hurt it.     Currently in Pain?  Yes    Pain Location  Hip    Pain Orientation  Right    Pain Descriptors / Indicators  Aching    Pain Type  Chronic pain    Pain Onset  More than a month ago    Pain Frequency  Intermittent     Aggravating Factors   activity     Pain Relieving Factors  stretching, advil            [facility] Adult PT Treatment/Exercise - 03/13/18 0001      Self-Care   Other Self-Care Comments   tennis ball       Lumbar Exercises: Stretches   Active Hamstring Stretch  3 reps;30 seconds    Lower Trunk Rotation  10 seconds    Lower Trunk Rotation Limitations  x 10     Piriformis Stretch  Right;3 reps    Figure 4 Stretch  3 reps;30 seconds      Lumbar Exercises: Supine   Clam  10 reps    Bridge  10 reps      Lumbar Exercises: Sidelying   Clam  Both;10 reps      Modalities   Modalities  Moist Heat      Moist Heat Therapy   Number Minutes Moist Heat  10 Minutes    Moist Heat Location  Lumbar Spine;Hip      Manual Therapy   Manual Therapy  Soft tissue mobilization    Soft tissue mobilization  Rt piriformis and glutes    sore , pain lateral SI border             PT Education - 03/13/18 1600    Education Details  HEP reinforcement , clam, tennis ball     Person(s) Educated  Patient    Methods  Explanation    Comprehension  Verbalized understanding;Returned demonstration          PT Long Term Goals - 02/27/18 1312      PT LONG TERM GOAL #1   Title  Pt will be I with HEP for flexibility and strength     Time  6    Period  Weeks    Status  New    Target Date  04/10/18      PT LONG TERM GOAL #2   Title  Pt will complete a balance screen and have a goal set for balance if warranted.     Time  6    Period  Weeks    Status  New    Target Date  04/10/18      PT LONG TERM GOAL #3   Title  Pt will be able to sit for 45 min for meals, rest without increasing pain in Rt buttocks.     Time  6    Period  Weeks    Status  New    Target Date  04/10/18      PT LONG TERM GOAL #4   Title  Pt will become more aware of posture, lifting as she completes ADLs, work tasks (sitting less) and walks    Time  6    Period  Weeks  Status  New    Target Date  04/10/18       PT LONG TERM GOAL #5   Title  Pt will be able to sleep with no more than min disturbance of pain from low back.     Time  6    Period  Weeks    Status  New    Target Date  04/10/18            Plan - 03/13/18 1559    Clinical Impression Statement  Patient with pain in Rt piriformis, limited strength in hip ER and lack of pelvic stability.  1 st treatment, no goals met.      PT Treatment/Interventions  ADLs/Self Care Home Management;Cryotherapy;Ultrasound;Moist Heat;Balance training;Dry needling;Therapeutic exercise;Patient/family education;Manual techniques;Passive range of motion;Therapeutic activities;Iontophoresis 12m/ml Dexamethasone;Electrical Stimulation;Neuromuscular re-education;Taping    PT Next Visit Plan  check HEP, manual/DN to piriformis, core , balance screen     PT Home Exercise Plan  LTR, hamstring and piriformis 2 ways , bridge and clam     Consulted and Agree with Plan of Care  Patient       Patient will benefit from skilled therapeutic intervention in order to improve the following deficits and impairments:  Decreased balance, Decreased mobility, Impaired sensation, Improper body mechanics, Pain, Postural dysfunction, Impaired flexibility, Impaired UE functional use, Increased fascial restricitons, Decreased strength, Decreased range of motion  Visit Diagnosis: Pain in right hip  Sacroiliac joint pain  Muscle weakness (generalized)  Abnormal posture     Problem List Patient Active Problem List   Diagnosis Date Noted  . Other long term (current) drug therapy 06/14/2017  . Anxiety and depression   . History of breast cancer 03/02/2017  . History of therapeutic radiation 03/02/2017  . Breast pain, right 10/03/2016  . Type 2 diabetes mellitus ([place] 08/23/2016  . Hypertension associated with diabetes ([place] 08/23/2016  . Hypothyroidism 08/23/2016  . Port catheter in place 05/09/2016  . Peripheral neuropathy 05/09/2016  . Constipation 04/19/2016  . Breast  cancer of upper-inner quadrant of right female breast ([facility] 03/09/2016  . Major depressive disorder, recurrent ([facility] 02/27/2014  . Uncomplicated alcohol dependence ([place] 02/19/2014    Marissa Hall 03/13/2018, 4:34 PM  [facility]GWanda [place] [address]Phone: [phone]  Fax:  [phone] Name: BSamamtha TiegsMRN: [id number] of Birth: [birth year] JRaeford Razor PT 03/13/18 4:35 PM Phone: [phone]: [phone]

## 2018-03-15 ENCOUNTER — Encounter: Payer: Self-pay | Admitting: Physical Therapy

## 2018-03-15 ENCOUNTER — Ambulatory Visit: Payer: Medicare Other | Admitting: Physical Therapy

## 2018-03-15 DIAGNOSIS — M533 Sacrococcygeal disorders, not elsewhere classified: Secondary | ICD-10-CM | POA: Diagnosis not present

## 2018-03-15 DIAGNOSIS — M25551 Pain in right hip: Secondary | ICD-10-CM

## 2018-03-15 DIAGNOSIS — R293 Abnormal posture: Secondary | ICD-10-CM

## 2018-03-15 DIAGNOSIS — M6281 Muscle weakness (generalized): Secondary | ICD-10-CM

## 2018-03-15 NOTE — Therapy (Signed)
[place], [place], [address] Phone: [phone]   Fax:  [phone]  Physical Therapy Treatment  Patient Details  Name: Marissa Hall MRN: [id number] Date of Birth: [birth year] Referring Provider (PT): Dr. Shawnie Dapper   Encounter Date: 03/15/2018  PT End of Session - 03/15/18 1549    Visit Number  3    Number of Visits  12    Date for PT Re-Evaluation  04/10/18    PT Start Time  0160    PT Stop Time  1639    PT Time Calculation (min)  54 min    Activity Tolerance  Patient tolerated treatment well    Behavior During Therapy  [facility] for tasks assessed/performed       Past Medical History:  Diagnosis Date  . Anxiety and depression 1964   oncologist started duloxetine 12/2016  . Breast cancer ([place]) 03/14/2016   Clinical stage 2A: (triple neg): Right breast, upper inner quadrant, 03/2016.  Neoadjuvant chemo x 5 cycles, then lumpectomy 4 mo later, then radiation started 10/2016.  Adjuvant Xeloda Aug through october 2018.  SWOG research trial pt 04/2017--pt randomized to pembrolizumab immunotherapy.  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  . Cataracts, bilateral 07/2017  . 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 Depression 1964   Patient states since age 77  . Diabetes ([place]) 2008   managed by endocrinology.  A1c Mar 12, 2018 was 7.0% at Dr. Shirlyn Goltz.  [name] Kitchen GERD (gastroesophageal reflux disease) 2013  . Hyperlipidemia 1986  . Hypertension 2008  . Hypothyroidism 1988   Diagnosed in her 11s.  Managed by Endocrinologist  . Osteoporosis 2015   pt states "osteopenia", but then says that she refused to take the rx med for this condition, so I suspect she had osteoporosis.  . Peripheral neuropathy 2017   Patient states diabetic neuropathy in feet prior to starting chemotherapy and then worsened by  chemo.   [name] Kitchen TIA (transient ischemic attack) 03/26/2011    Past Surgical History:  Procedure Laterality Date  . ABDOMINAL HYSTERECTOMY  1972  . APPENDECTOMY  1972  . BREAST ENHANCEMENT SURGERY  1982  . BREAST IMPLANT REMOVAL Right 09/13/2016   Procedure: REMOVAL RIGHT BREAST IMPLANT;  Surgeon: Irene Limbo, MD;  Location: [facility];  Service: Plastics;  Laterality: Right;  . BREAST LUMPECTOMY WITH RADIOACTIVE SEED AND SENTINEL LYMPH NODE BIOPSY Right 09/13/2016   Procedure: RIGHT BREAST LUMPECTOMY WITH RADIOACTIVE SEED X 2 AND SENTINEL LYMPH NODE BIOPSY;  Surgeon: Alphonsa Overall, MD;  Location: [facility];  Service: General;  Laterality: Right;  . BREAST SURGERY Right 03/14/2016   Biopsy  . CAPSULECTOMY Right 09/13/2016   Procedure: RIGHT CAPSULECTOMY;  Surgeon: Irene Limbo, MD;  Location: [facility];  Service: Plastics;  Laterality: Right;  . CATARACT EXTRACTION, BILATERAL Bilateral 08/10/17 right eye, 08/31/17 left eye  . MASS EXCISION Left 02/28/2018   Path: benign.  Procedure: EXCISIONLEFT MEDIAL THIGH MASS ERAS PATHWAY;  Surgeon: Erroll Luna, MD;  Location: [facility];  Service: General;  Laterality: Left;  . PORTACATH PLACEMENT N/A 03/15/2016   Procedure: INSERTION PORT-A-CATH WITH [place];  Surgeon: Alphonsa Overall, MD;  [facility];  Service: General;  Laterality: N/A;  . PORTACATH REMOVAL  07/2017  . surgical repair left ankle Left 2009   s/p Fall   . TONSILLECTOMY AND  ADENOIDECTOMY  1948   Age 77    There were no vitals filed for this visit.  Subjective Assessment - 03/15/18 1546    Subjective  Patient was very sore from Tuesday.  Had to take a Tylenol yesterday.     Currently in Pain?  Yes    Pain Score  2          [facility] PT Assessment - 03/15/18 0001      Berg Balance Test   Sit to Stand  Able to stand without using hands and stabilize independently    Standing Unsupported  Able to stand safely 2 minutes     Sitting with Back Unsupported but Feet Supported on Floor or Stool  Able to sit safely and securely 2 minutes    Stand to Sit  Sits safely with minimal use of hands    Transfers  Able to transfer safely, minor use of hands    Standing Unsupported with Eyes Closed  Able to stand 10 seconds safely    Standing Ubsupported with Feet Together  Able to place feet together independently and stand 1 minute safely    From Standing, Reach Forward with Outstretched Arm  Can reach confidently >25 cm (10")    From Standing Position, Pick up Object from Floor  Able to pick up shoe safely and easily    From Standing Position, Turn to Look Behind Over each Shoulder  Looks behind from both sides and weight shifts well    Turn 360 Degrees  Able to turn 360 degrees safely in 4 seconds or less    Standing Unsupported, Alternately Place Feet on Step/Stool  Able to stand independently and safely and complete 8 steps in 20 seconds    Standing Unsupported, One Foot in Front  Able to plae foot ahead of the other independently and hold 30 seconds    Standing on One Leg  Able to lift leg independently and hold 5-10 seconds    Total Score  54      Dynamic Gait Index   Level Surface  Normal    Change in Gait Speed  Normal    Gait with Horizontal Head Turns  Mild Impairment    Gait with Vertical Head Turns  Normal    Gait and Pivot Turn  Normal    Step Over Obstacle  Normal    Step Around Obstacles  Normal    Steps  Normal    Total Score  23                   [facility] Adult PT Treatment/Exercise - 03/15/18 0001      Self-Care   Other Self-Care Comments   dry needling, balance results       Lumbar Exercises: Stretches   Lower Trunk Rotation  10 seconds    Lower Trunk Rotation Limitations  x 10     Piriformis Stretch  Right;Left;2 reps;30 seconds      Lumbar Exercises: Supine   Clam  20 reps   bi and unilateral    Bridge  10 reps   then bridge/clam combo x 10      Lumbar Exercises: Sidelying    Clam  Both;10 reps      Modalities   Modalities  Moist Heat      Moist Heat Therapy   Number Minutes Moist Heat  10 Minutes    Moist Heat Location  Lumbar Spine;Hip  PT Long Term Goals - 03/15/18 1553      PT LONG TERM GOAL #1   Title  Pt will be I with HEP for flexibility and strength     Status  On-going      PT LONG TERM GOAL #2   Title  Pt will complete a balance screen and have a goal set for balance if warranted.     Status  Achieved      PT LONG TERM GOAL #3   Title  Pt will be able to sit for 45 min for meals, rest without increasing pain in Rt buttocks.     Status  On-going      PT LONG TERM GOAL #4   Title  Pt will become more aware of posture, lifting as she completes ADLs, work tasks (sitting less) and walks    Status  On-going      PT LONG TERM GOAL #5   Title  Pt will be able to sleep with no more than min disturbance of pain from low back.     Status  On-going            Plan - 03/15/18 1622    Clinical Impression Statement  Balance screen was completed and she did well with both the Berg balance Test and DGI.  Atrribute her lack of confidence in balance to neuropathy and possible ankle issues.  Able to performing her HEP without cueing.  Was sore but encouraged to continue to stretch to relieve soreness.     PT Treatment/Interventions  ADLs/Self Care Home Management;Cryotherapy;Ultrasound;Moist Heat;Balance training;Dry needling;Therapeutic exercise;Patient/family education;Manual techniques;Passive range of motion;Therapeutic activities;Iontophoresis 4mg /ml Dexamethasone;Electrical Stimulation;Neuromuscular re-education;Taping    PT Next Visit Plan  Tr DN to Rt glute, piriformis , manual, hip and core strength     PT Home Exercise Plan  LTR, hamstring and piriformis 2 ways , bridge and clam     Consulted and Agree with Plan of Care  Patient       Patient will benefit from skilled therapeutic intervention in order to improve  the following deficits and impairments:  Decreased balance, Decreased mobility, Impaired sensation, Improper body mechanics, Pain, Postural dysfunction, Impaired flexibility, Impaired UE functional use, Increased fascial restricitons, Decreased strength, Decreased range of motion  Visit Diagnosis: Pain in right hip  Sacroiliac joint pain  Muscle weakness (generalized)  Abnormal posture     Problem List Patient Active Problem List   Diagnosis Date Noted  . Other long term (current) drug therapy 06/14/2017  . Anxiety and depression   . History of breast cancer 03/02/2017  . History of therapeutic radiation 03/02/2017  . Breast pain, right 10/03/2016  . Type 2 diabetes mellitus ([place]) 08/23/2016  . Hypertension associated with diabetes ([place]) 08/23/2016  . Hypothyroidism 08/23/2016  . Port catheter in place 05/09/2016  . Peripheral neuropathy 05/09/2016  . Constipation 04/19/2016  . Breast cancer of upper-inner quadrant of right female breast ([place]) 03/09/2016  . Major depressive disorder, recurrent ([place]) 02/27/2014  . Uncomplicated alcohol dependence ([place]) 02/19/2014    Cydney Alvarenga 03/15/2018, 4:37 PM  [facility] [address] [place], [place], [address] Phone: [phone]   Fax:  [phone]  Name: Marissa Hall MRN: [id number] Date of Birth: 1940/10/06  Raeford Razor, PT 03/15/18 4:38 PM Phone: [phone] Fax: [phone]

## 2018-03-19 ENCOUNTER — Ambulatory Visit: Payer: Medicare Other | Admitting: Physical Therapy

## 2018-03-19 ENCOUNTER — Encounter: Payer: Self-pay | Admitting: Physical Therapy

## 2018-03-19 DIAGNOSIS — M533 Sacrococcygeal disorders, not elsewhere classified: Secondary | ICD-10-CM | POA: Diagnosis not present

## 2018-03-19 DIAGNOSIS — M6281 Muscle weakness (generalized): Secondary | ICD-10-CM | POA: Diagnosis not present

## 2018-03-19 DIAGNOSIS — R293 Abnormal posture: Secondary | ICD-10-CM

## 2018-03-19 DIAGNOSIS — M25551 Pain in right hip: Secondary | ICD-10-CM | POA: Diagnosis not present

## 2018-03-19 NOTE — Therapy (Signed)
[place], [place], [address] Phone: [phone]   Fax:  [phone]  Physical Therapy Treatment  Patient Details  Name: Marissa Hall MRN: [id number] Date of Birth: Jan 07, 1941 Referring Provider (PT): Dr. Shawnie Dapper   Encounter Date: 03/19/2018  PT End of Session - 03/19/18 1215    Visit Number  4    Number of Visits  12    Date for PT Re-Evaluation  04/10/18    PT Start Time  1119    PT Stop Time  1218    PT Time Calculation (min)  59 min    Activity Tolerance  Patient tolerated treatment well    Behavior During Therapy  [facility] for tasks assessed/performed       Past Medical History:  Diagnosis Date  . Anxiety and depression 1964   oncologist started duloxetine 12/2016  . Breast cancer ([place]) 03/14/2016   Clinical stage 2A: (triple neg): Right breast, upper inner quadrant, 03/2016.  Neoadjuvant chemo x 5 cycles, then lumpectomy 4 mo later, then radiation started 10/2016.  Adjuvant Xeloda Aug through october 2018.  SWOG research trial pt 04/2017--pt randomized to pembrolizumab immunotherapy.  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  . Cataracts, bilateral 07/2017  . 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 Depression 1964   Patient states since age 41  . Diabetes ([place]) 2008   managed by endocrinology.  A1c Mar 12, 2018 was 7.0% at Dr. Shirlyn Goltz.  [name] Kitchen GERD (gastroesophageal reflux disease) 2013  . Hyperlipidemia 1986  . Hypertension 2008  . Hypothyroidism 1988   Diagnosed in her 41s.  Managed by Endocrinologist  . Osteoporosis 2015   pt states "osteopenia", but then says that she refused to take the rx med for this condition, so I suspect she had osteoporosis.  . Peripheral neuropathy 2017   Patient states diabetic neuropathy in feet prior to starting chemotherapy and then worsened by  chemo.   [name] Kitchen TIA (transient ischemic attack) 03/26/2011    Past Surgical History:  Procedure Laterality Date  . ABDOMINAL HYSTERECTOMY  1972  . APPENDECTOMY  1972  . BREAST ENHANCEMENT SURGERY  1982  . BREAST IMPLANT REMOVAL Right 09/13/2016   Procedure: REMOVAL RIGHT BREAST IMPLANT;  Surgeon: Irene Limbo, MD;  Location: [place];  Service: Plastics;  Laterality: Right;  . BREAST LUMPECTOMY WITH RADIOACTIVE SEED AND SENTINEL LYMPH NODE BIOPSY Right 09/13/2016   Procedure: RIGHT BREAST LUMPECTOMY WITH RADIOACTIVE SEED X 2 AND SENTINEL LYMPH NODE BIOPSY;  Surgeon: Alphonsa Overall, MD;  Location: [facility];  Service: General;  Laterality: Right;  . BREAST SURGERY Right 03/14/2016   Biopsy  . CAPSULECTOMY Right 09/13/2016   Procedure: RIGHT CAPSULECTOMY;  Surgeon: Irene Limbo, MD;  Location: [facility];  Service: Plastics;  Laterality: Right;  . CATARACT EXTRACTION, BILATERAL Bilateral 08/10/17 right eye, 08/31/17 left eye  . MASS EXCISION Left 02/28/2018   Path: benign.  Procedure: EXCISIONLEFT MEDIAL THIGH MASS ERAS PATHWAY;  Surgeon: Erroll Luna, MD;  Location: [place];  Service: General;  Laterality: Left;  . PORTACATH PLACEMENT N/A 03/15/2016   Procedure: INSERTION PORT-A-CATH WITH [place];  Surgeon: Alphonsa Overall, MD;  [facility];  Service: General;  Laterality: N/A;  . PORTACATH REMOVAL  07/2017  . surgical repair left ankle Left 2009   s/p Fall   . TONSILLECTOMY AND  ADENOIDECTOMY  1948   Age 8    There were no vitals filed for this visit.  Subjective Assessment - 03/19/18 1154    Subjective  Patient reports she has been sore today. 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 Reports pain is worse when she has to sit for prolonged periods, such as at work,      Pertinent History  having lipoma removed tomorrow,  LLE ankle  surgery, diabetes , breast cancer, osteopenia, diabetes and neuropathy     Limitations  Walking;Standing;Lifting;Sitting;Other (comment);House hold activities    How long can you sit comfortably?  30 min     How long can you stand comfortably?  cooked for about 3-4 hours and felt OK, but incr pain the next day     How long can you walk comfortably?  30 min (start and stop)    Diagnostic tests  none     Patient Stated Goals  Patient wants to be able to feel a little more comfortable.     Currently in Pain?  Yes    Pain Score  4     Pain Location  Hip    Pain Orientation  Right    Pain Descriptors / Indicators  Sore    Pain Type  Chronic pain    Pain Onset  More than a month ago    Pain Frequency  Intermittent    Aggravating Factors   actvity    Pain Relieving Factors  stretching, advil    Effect of Pain on Daily Activities  limits her mobility                        [facility] Adult PT Treatment/Exercise - 03/19/18 0001      Lumbar Exercises: Stretches   Single Knee to Chest Stretch Limitations  3x20sec     Piriformis Stretch Limitations  5x15 sec       Lumbar Exercises: Supine   Clam  20 reps   green t-band   Bridge  10 reps      Moist Heat Therapy   Number Minutes Moist Heat  10 Minutes    Moist Heat Location  Lumbar Spine;Hip      Manual Therapy   Manual Therapy  Manual Traction    Manual therapy comments  Rocktape blade and foam rolled over right gluts, piriformis, posterior hip ; skilled palpation of trigger points     Soft tissue mobilization  Rt piriformis and glutes     Manual Traction  Long axis distraction bil LE 2x20sec             PT Education - 03/19/18 1214    Education Details  Reviewed HEP; emphasized stretching and use of tennis ball with increased soreness following dry needling      Person(s) Educated  Patient    Methods  Explanation    Comprehension  Verbalized understanding;Returned demonstration          PT Long Term Goals -  03/15/18 1553      PT LONG TERM GOAL #1   Title  Pt will be I with HEP for flexibility and strength     Status  On-going      PT LONG TERM GOAL #2   Title  Pt will complete a balance screen and have a goal set for balance if warranted.  Status  Achieved      PT LONG TERM GOAL #3   Title  Pt will be able to sit for 45 min for meals, rest without increasing pain in Rt buttocks.     Status  On-going      PT LONG TERM GOAL #4   Title  Pt will become more aware of posture, lifting as she completes ADLs, work tasks (sitting less) and walks    Status  On-going      PT LONG TERM GOAL #5   Title  Pt will be able to sleep with no more than min disturbance of pain from low back.     Status  On-going            Plan - 03/19/18 1224    Clinical Impression Statement  Pt arrived late to appointment, but due to cancelation afterwards was still able to get in full session. Pt tolerated treatment well. Great twitch response ilicited on right piriformis and glut max muscle with dry needling. Utilized rock blade and foam rolling to right Gluts, piriformis, and posterior hip region afterwards with pt reporting that foam rolling felt "really good" over areas that had been needled. Pt demonstrated decreased tightness in right gluts and posterior hip musculature following manual therapy and stretches.     Clinical Presentation  Stable    Clinical Decision Making  Low    Rehab Potential  Excellent    PT Frequency  2x / week    PT Duration  6 weeks    PT Treatment/Interventions  ADLs/Self Care Home Management;Cryotherapy;Ultrasound;Moist Heat;Balance training;Dry needling;Therapeutic exercise;Patient/family education;Manual techniques;Passive range of motion;Therapeutic activities;Iontophoresis 4mg /ml Dexamethasone;Electrical Stimulation;Neuromuscular re-education;Taping    PT Next Visit Plan  STM over piriformis and glut, foam rolling and rock blade, sidelying clamshells, Ab sets with marching,   bridges, standing 4 way hip    PT Home Exercise Plan  LTR, hamstring and piriformis 2 ways , bridge and clam     Consulted and Agree with Plan of Care  Patient       Patient will benefit from skilled therapeutic intervention in order to improve the following deficits and impairments:  Decreased balance, Decreased mobility, Impaired sensation, Improper body mechanics, Pain, Postural dysfunction, Impaired flexibility, Impaired UE functional use, Increased fascial restricitons, Decreased strength, Decreased range of motion  Visit Diagnosis: Pain in right hip  Sacroiliac joint pain  Muscle weakness (generalized)  Abnormal posture     Problem List Patient Active Problem List   Diagnosis Date Noted  . Other long term (current) drug therapy 06/14/2017  . Anxiety and depression   . History of breast cancer 03/02/2017  . History of therapeutic radiation 03/02/2017  . Breast pain, right 10/03/2016  . Type 2 diabetes mellitus ([place]) 08/23/2016  . Hypertension associated with diabetes ([place]) 08/23/2016  . Hypothyroidism 08/23/2016  . Port catheter in place 05/09/2016  . Peripheral neuropathy 05/09/2016  . Constipation 04/19/2016  . Breast cancer of upper-inner quadrant of right female breast ([place]) 03/09/2016  . Major depressive disorder, recurrent ([place]) 02/27/2014  . Uncomplicated alcohol dependence ([place]) 02/19/2014   Carolyne Littles PT DPT  03/19/2018    Einar Crow SPT 03/19/2018, 12:51 PM   During this treatment session, the therapist was present, participating in and directing the treatment.  [place], [place], [address] Phone: [phone]   Fax:  [phone]  Name: Marissa Hall MRN: [id number] Date of Birth: 03-21-1941

## 2018-03-21 ENCOUNTER — Encounter: Payer: Self-pay | Admitting: Family Medicine

## 2018-03-21 DIAGNOSIS — R928 Other abnormal and inconclusive findings on diagnostic imaging of breast: Secondary | ICD-10-CM | POA: Diagnosis not present

## 2018-03-21 DIAGNOSIS — Z853 Personal history of malignant neoplasm of breast: Secondary | ICD-10-CM | POA: Diagnosis not present

## 2018-03-21 LAB — HM MAMMOGRAPHY

## 2018-03-22 ENCOUNTER — Encounter: Payer: Self-pay | Admitting: Physical Therapy

## 2018-03-22 ENCOUNTER — Ambulatory Visit: Payer: Medicare Other | Admitting: Family Medicine

## 2018-03-22 ENCOUNTER — Ambulatory Visit: Payer: Medicare Other | Admitting: Physical Therapy

## 2018-03-22 DIAGNOSIS — M533 Sacrococcygeal disorders, not elsewhere classified: Secondary | ICD-10-CM | POA: Diagnosis not present

## 2018-03-22 DIAGNOSIS — M25551 Pain in right hip: Secondary | ICD-10-CM

## 2018-03-22 DIAGNOSIS — R293 Abnormal posture: Secondary | ICD-10-CM | POA: Diagnosis not present

## 2018-03-22 DIAGNOSIS — M6281 Muscle weakness (generalized): Secondary | ICD-10-CM

## 2018-03-22 NOTE — Therapy (Signed)
Marissa Hall, [place], [address] Phone: [phone]   Fax:  [phone]  Physical Therapy Treatment  Patient Details  Name: Marissa Hall MRN: [id number] Date of Birth: [birth year] Referring Provider (PT): Dr. Shawnie Dapper   Encounter Date: 03/22/2018  PT End of Session - 03/22/18 1112    Visit Number  5    Number of Visits  12    Date for PT Re-Evaluation  04/10/18    PT Start Time  1100    PT Stop Time  1158    PT Time Calculation (min)  58 min    Activity Tolerance  Patient tolerated treatment well    Behavior During Therapy  [facility] for tasks assessed/performed       Past Medical History:  Diagnosis Date  . Anxiety and depression 1964   oncologist started duloxetine 12/2016  . Breast cancer ([place]) 03/14/2016   Clinical stage 2A: (triple neg): Right breast, upper inner quadrant, 03/2016.  Neoadjuvant chemo x 5 cycles, then lumpectomy 4 mo later, then radiation started 10/2016.  Adjuvant Xeloda Aug through october 2018.  SWOG research trial pt 04/2017--pt randomized to pembrolizumab immunotherapy.  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  . Cataracts, bilateral 07/2017  . 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 Depression 1964   Patient states since age 42  . Diabetes ([place]) 2008   managed by endocrinology.  A1c Mar 12, 2018 was 7.0% at Dr. Shirlyn Goltz.  [name] Kitchen GERD (gastroesophageal reflux disease) 2013  . Hyperlipidemia 1986  . Hypertension 2008  . Hypothyroidism 1988   Diagnosed in her 70s.  Managed by Endocrinologist  . Osteoporosis 2015   pt states "osteopenia", but then says that she refused to take the rx med for this condition, so I suspect she had osteoporosis.  . Peripheral neuropathy 2017   Patient states diabetic neuropathy in feet prior to starting chemotherapy and then worsened by  chemo.   [name] Kitchen TIA (transient ischemic attack) 03/26/2011    Past Surgical History:  Procedure Laterality Date  . ABDOMINAL HYSTERECTOMY  1972  . APPENDECTOMY  1972  . BREAST ENHANCEMENT SURGERY  1982  . BREAST IMPLANT REMOVAL Right 09/13/2016   Procedure: REMOVAL RIGHT BREAST IMPLANT;  Surgeon: Irene Limbo, MD;  Location: [facility];  Service: Plastics;  Laterality: Right;  . BREAST LUMPECTOMY WITH RADIOACTIVE SEED AND SENTINEL LYMPH NODE BIOPSY Right 09/13/2016   Procedure: RIGHT BREAST LUMPECTOMY WITH RADIOACTIVE SEED X 2 AND SENTINEL LYMPH NODE BIOPSY;  Surgeon: Alphonsa Overall, MD;  Location: [place];  Service: General;  Laterality: Right;  . BREAST SURGERY Right 03/14/2016   Biopsy  . CAPSULECTOMY Right 09/13/2016   Procedure: RIGHT CAPSULECTOMY;  Surgeon: Irene Limbo, MD;  Location: [facility];  Service: Plastics;  Laterality: Right;  . CATARACT EXTRACTION, BILATERAL Bilateral 08/10/17 right eye, 08/31/17 left eye  . MASS EXCISION Left 02/28/2018   Path: benign.  Procedure: EXCISIONLEFT MEDIAL THIGH MASS ERAS PATHWAY;  Surgeon: Erroll Luna, MD;  Location: [facility];  Service: General;  Laterality: Left;  . PORTACATH PLACEMENT N/A 03/15/2016   Procedure: INSERTION PORT-A-CATH WITH [place];  Surgeon: Alphonsa Overall, MD;  [facility];  Service: General;  Laterality: N/A;  . PORTACATH REMOVAL  07/2017  . surgical repair left ankle Left 2009   s/p Fall   . TONSILLECTOMY AND  ADENOIDECTOMY  1948   Age 77    There were no vitals filed for this visit.  Subjective Assessment - 03/22/18 1059    Subjective  The needling really helped.  Im a bit sore.      Currently in Pain?  Yes    Pain Score  1     Pain Location  Hip    Pain Orientation  Right    Pain Descriptors / Indicators  Sore    Pain Type  Chronic pain    Pain Onset  More than a month ago    Pain Frequency  Intermittent    Aggravating Factors   sitting     Pain  Relieving Factors  stretching         [facility] Adult PT Treatment/Exercise - 03/22/18 0001      Lumbar Exercises: Stretches   Active Hamstring Stretch  3 reps;30 seconds    Lower Trunk Rotation  10 seconds    Lower Trunk Rotation Limitations  x 10 in figure 4     Figure 4 Stretch  2 reps;30 seconds      Lumbar Exercises: Supine   Ab Set  10 reps    Bridge  10 reps    Basic Lumbar Stabilization  10 reps    Advanced Lumbar Stabilization Limitations  UE flexion alternating, marching and clam x  10     Straight Leg Raise  10 reps    Other Supine Lumbar Exercises  used ball under pelvis to destabilize       Moist Heat Therapy   Number Minutes Moist Heat  10 Minutes    Moist Heat Location  Lumbar Spine;Hip      Manual Therapy   Soft tissue mobilization  Rt piriformis and glutes    L5-S1             PT Education - 03/22/18 1111    Education Details  stabilization concepts     Person(s) Educated  Patient    Methods  Explanation;Tactile cues;Verbal cues    Comprehension  Verbalized understanding          PT Long Term Goals - 03/15/18 1553      PT LONG TERM GOAL #1   Title  Pt will be I with HEP for flexibility and strength     Status  On-going      PT LONG TERM GOAL #2   Title  Pt will complete a balance screen and have a goal set for balance if warranted.     Status  Achieved      PT LONG TERM GOAL #3   Title  Pt will be able to sit for 45 min for meals, rest without increasing pain in Rt buttocks.     Status  On-going      PT LONG TERM GOAL #4   Title  Pt will become more aware of posture, lifting as she completes ADLs, work tasks (sitting less) and walks    Status  On-going      PT LONG TERM GOAL #5   Title  Pt will be able to sleep with no more than min disturbance of pain from low back.     Status  On-going            Plan - 03/22/18 1113    Clinical Impression Statement  Patient noted a reduction in pain in her Rt piriformis.  Began lower  abdominal exercises without difficulty.  Less stiffness post session.  PT Treatment/Interventions  ADLs/Self Care Home Management;Cryotherapy;Ultrasound;Moist Heat;Balance training;Dry needling;Therapeutic exercise;Patient/family education;Manual techniques;Passive range of motion;Therapeutic activities;Iontophoresis 4mg /ml Dexamethasone;Electrical Stimulation;Neuromuscular re-education;Taping    PT Next Visit Plan  cont core, manual as needed , try4 way SLR standing     PT Home Exercise Plan  LTR, hamstring and piriformis 2 ways , bridge and clam        Patient will benefit from skilled therapeutic intervention in order to improve the following deficits and impairments:  Decreased balance, Decreased mobility, Impaired sensation, Improper body mechanics, Pain, Postural dysfunction, Impaired flexibility, Impaired UE functional use, Increased fascial restricitons, Decreased strength, Decreased range of motion  Visit Diagnosis: Pain in right hip  Sacroiliac joint pain  Muscle weakness (generalized)  Abnormal posture     Problem List Patient Active Problem List   Diagnosis Date Noted  . Other long term (current) drug therapy 06/14/2017  . Anxiety and depression   . History of breast cancer 03/02/2017  . History of therapeutic radiation 03/02/2017  . Breast pain, right 10/03/2016  . Type 2 diabetes mellitus ([place]) 08/23/2016  . Hypertension associated with diabetes ([place]) 08/23/2016  . Hypothyroidism 08/23/2016  . Port catheter in place 05/09/2016  . Peripheral neuropathy 05/09/2016  . Constipation 04/19/2016  . Breast cancer of upper-inner quadrant of right female breast ([place]) 03/09/2016  . Major depressive disorder, recurrent ([place]) 02/27/2014  . Uncomplicated alcohol dependence ([place]) 02/19/2014    Tadan Shill 03/22/2018, 12:27 PM  [facility] [address] [place], [place], [address] Phone: [phone]   Fax:   [phone]  Name: Bijou Easler MRN: [id number] Date of Birth: [birth year]  Raeford Razor, PT 03/22/18 12:28 PM Phone: [phone] Fax: [phone]

## 2018-03-23 ENCOUNTER — Encounter: Payer: Self-pay | Admitting: Family Medicine

## 2018-03-27 ENCOUNTER — Ambulatory Visit: Payer: Medicare Other | Admitting: Physical Therapy

## 2018-03-27 DIAGNOSIS — M533 Sacrococcygeal disorders, not elsewhere classified: Secondary | ICD-10-CM

## 2018-03-27 DIAGNOSIS — M6281 Muscle weakness (generalized): Secondary | ICD-10-CM | POA: Diagnosis not present

## 2018-03-27 DIAGNOSIS — M25551 Pain in right hip: Secondary | ICD-10-CM | POA: Diagnosis not present

## 2018-03-27 DIAGNOSIS — R293 Abnormal posture: Secondary | ICD-10-CM

## 2018-03-27 NOTE — Therapy (Signed)
[place], [place], [address] Phone: [phone]   Fax:  [phone]  Physical Therapy Treatment  Patient Details  Name: Marissa Hall MRN: [id number] Date of Birth: [birth year] Referring Provider (PT): Dr. Shawnie Dapper   Encounter Date: 03/27/2018  PT End of Session - 03/27/18 1621    Visit Number  6    Number of Visits  12    Date for PT Re-Evaluation  04/10/18    PT Start Time  1548    PT Stop Time  1644    PT Time Calculation (min)  56 min    Activity Tolerance  Patient tolerated treatment well    Behavior During Therapy  [facility] for tasks assessed/performed       Past Medical History:  Diagnosis Date  . Anxiety and depression 1964   oncologist started duloxetine 12/2016  . Breast cancer ([place]) 03/14/2016   Clinical stage 2A: (triple neg): Right breast, upper inner quadrant, 03/2016.  Neoadjuvant chemo x 5 cycles, then lumpectomy 4 mo later, then radiation started 10/2016.  Adjuvant Xeloda Aug through october 2018.  SWOG research trial pt 04/2017--pt randomized to pembrolizumab immunotherapy.  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  . Cataracts, bilateral 07/2017  . 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 Depression 1964   Patient states since age 44  . Diabetes ([place]) 2008   managed by endocrinology.  A1c Mar 12, 2018 was 7.0% at Dr. Shirlyn Goltz.  [name] Kitchen GERD (gastroesophageal reflux disease) 2013  . Hyperlipidemia 1986  . Hypertension 2008  . Hypothyroidism 1988   Diagnosed in her 66s.  Managed by Endocrinologist  . Osteoporosis 2015   pt states "osteopenia", but then says that she refused to take the rx med for this condition, so I suspect she had osteoporosis.  . Peripheral neuropathy 2017   Patient states diabetic neuropathy in feet prior to starting chemotherapy and then worsened by  chemo.   [name] Kitchen TIA (transient ischemic attack) 03/26/2011    Past Surgical History:  Procedure Laterality Date  . ABDOMINAL HYSTERECTOMY  1972  . APPENDECTOMY  1972  . BREAST ENHANCEMENT SURGERY  1982  . BREAST IMPLANT REMOVAL Right 09/13/2016   Procedure: REMOVAL RIGHT BREAST IMPLANT;  Surgeon: Irene Limbo, MD;  Location: [place];  Service: Plastics;  Laterality: Right;  . BREAST LUMPECTOMY WITH RADIOACTIVE SEED AND SENTINEL LYMPH NODE BIOPSY Right 09/13/2016   Procedure: RIGHT BREAST LUMPECTOMY WITH RADIOACTIVE SEED X 2 AND SENTINEL LYMPH NODE BIOPSY;  Surgeon: Alphonsa Overall, MD;  Location: [place];  Service: General;  Laterality: Right;  . BREAST SURGERY Right 03/14/2016   Biopsy  . CAPSULECTOMY Right 09/13/2016   Procedure: RIGHT CAPSULECTOMY;  Surgeon: Irene Limbo, MD;  Location: [place];  Service: Plastics;  Laterality: Right;  . CATARACT EXTRACTION, BILATERAL Bilateral 08/10/17 right eye, 08/31/17 left eye  . MASS EXCISION Left 02/28/2018   Path: benign.  Procedure: EXCISIONLEFT MEDIAL THIGH MASS ERAS PATHWAY;  Surgeon: Erroll Luna, MD;  Location: [place];  Service: General;  Laterality: Left;  . PORTACATH PLACEMENT N/A 03/15/2016   Procedure: INSERTION PORT-A-CATH WITH [place];  Surgeon: Alphonsa Overall, MD;  [facility];  Service: General;  Laterality: N/A;  . PORTACATH REMOVAL  07/2017  . surgical repair left ankle Left 2009   s/p Fall   . TONSILLECTOMY AND  ADENOIDECTOMY  1948   Age 77    There were no vitals filed for this visit.  Subjective Assessment - 03/27/18 1553    Subjective  I have a little soreness today.      Currently in Pain?  Yes    Pain Score  2     Pain Location  Hip    Pain Orientation  Right    Pain Descriptors / Indicators  Sore    Pain Type  Chronic pain    Pain Onset  More than a month ago    Pain Frequency  Intermittent    Aggravating Factors   sitting    Pain Relieving  Factors  stretching          [facility] Adult PT Treatment/Exercise - 03/27/18 0001      Knee/Hip Exercises: Stretches   Piriformis Stretch  Right;3 reps;30 seconds      Knee/Hip Exercises: Aerobic   Tread Mill  8 min at 2.0 mph , no incr pain       Knee/Hip Exercises: Standing   Hip Flexion  Stengthening;Right;Left;1 set;10 reps    Forward Lunges Limitations       Hip Abduction  Stengthening;Both;1 set;15 reps    Hip Extension  Stengthening;Both;1 set;15 reps      Knee/Hip Exercises: Sidelying   Hip ABduction  Strengthening;Both;1 set;10 reps    Clams  x 20       Knee/Hip Exercises: Prone   Hip Extension  Strengthening;Both;1 set;10 reps    Hip Extension Limitations  SLR     Other Prone Exercises  quad stretch with strap       Moist Heat Therapy   Number Minutes Moist Heat  10 Minutes    Moist Heat Location  Lumbar Spine      Manual Therapy   Joint Mobilization  ant capsule stretch , compression with hip ER and IR     Soft tissue mobilization  LAD Rt LE    L5-S1             PT Education - 03/27/18 1620    Education Details  HEP reprint , standing hip     Person(s) Educated  Patient    Methods  Explanation    Comprehension  Verbalized understanding          PT Long Term Goals - 03/27/18 1622      PT LONG TERM GOAL #1   Title  Pt will be I with HEP for flexibility and strength     Baseline  up to date     Status  On-going      PT LONG TERM GOAL #2   Title  Pt will complete a balance screen and have a goal set for balance if warranted.     Status  Achieved      PT LONG TERM GOAL #3   Title  Pt will be able to sit for 45 min for meals, rest without increasing pain in Rt buttocks.     Baseline  a bit better     Status  On-going      PT LONG TERM GOAL #4   Title  Pt will become more aware of posture, lifting as she completes ADLs, work tasks (sitting less) and walks    Status  On-going      PT LONG TERM GOAL #5   Title  Pt will be able to sleep with  no more than min disturbance of pain from low back/hip.  Baseline  waker her if she lies on her side             Plan - 03/27/18 1550    Clinical Impression Statement  Pt making consistent progress.  Still has pain with any amount of sitting.  Anterior hip tightness noted with hip extension activities.      PT Treatment/Interventions  ADLs/Self Care Home Management;Cryotherapy;Ultrasound;Moist Heat;Balance training;Dry needling;Therapeutic exercise;Patient/family education;Manual techniques;Passive range of motion;Therapeutic activities;Iontophoresis 4mg /ml Dexamethasone;Electrical Stimulation;Neuromuscular re-education;Taping    PT Next Visit Plan  cont core, DN/manual as needed , hip flexibility,     PT Home Exercise Plan  LTR, hamstring and piriformis 2 ways , bridge and clam        Patient will benefit from skilled therapeutic intervention in order to improve the following deficits and impairments:  Decreased balance, Decreased mobility, Impaired sensation, Improper body mechanics, Pain, Postural dysfunction, Impaired flexibility, Impaired UE functional use, Increased fascial restricitons, Decreased strength, Decreased range of motion  Visit Diagnosis: Pain in right hip  Sacroiliac joint pain  Muscle weakness (generalized)  Abnormal posture     Problem List Patient Active Problem List   Diagnosis Date Noted  . Other long term (current) drug therapy 06/14/2017  . Anxiety and depression   . History of breast cancer 03/02/2017  . History of therapeutic radiation 03/02/2017  . Breast pain, right 10/03/2016  . Type 2 diabetes mellitus ([place]) 08/23/2016  . Hypertension associated with diabetes ([place]) 08/23/2016  . Hypothyroidism 08/23/2016  . Port catheter in place 05/09/2016  . Peripheral neuropathy 05/09/2016  . Constipation 04/19/2016  . Breast cancer of upper-inner quadrant of right female breast ([place]) 03/09/2016  . Major depressive disorder, recurrent ([place])  02/27/2014  . Uncomplicated alcohol dependence ([place]) 02/19/2014    Apolinar Bero 03/27/2018, 4:38 PM  [facility] [address] [place], [place], [address] Phone: [phone]   Fax:  [phone]  Name: Marissa Hall MRN: [id number] Date of Birth: [birth year]   Raeford Razor, PT 03/27/18 4:38 PM Phone: [phone] Fax: [phone]

## 2018-03-29 ENCOUNTER — Ambulatory Visit: Payer: Medicare Other | Admitting: Physical Therapy

## 2018-03-30 DIAGNOSIS — C50911 Malignant neoplasm of unspecified site of right female breast: Secondary | ICD-10-CM | POA: Diagnosis not present

## 2018-03-30 DIAGNOSIS — L989 Disorder of the skin and subcutaneous tissue, unspecified: Secondary | ICD-10-CM | POA: Diagnosis not present

## 2018-03-30 DIAGNOSIS — N6011 Diffuse cystic mastopathy of right breast: Secondary | ICD-10-CM | POA: Diagnosis not present

## 2018-04-02 ENCOUNTER — Encounter: Payer: Self-pay | Admitting: Physical Therapy

## 2018-04-02 ENCOUNTER — Ambulatory Visit: Payer: Medicare Other | Admitting: Physical Therapy

## 2018-04-02 DIAGNOSIS — R293 Abnormal posture: Secondary | ICD-10-CM | POA: Diagnosis not present

## 2018-04-02 DIAGNOSIS — M6281 Muscle weakness (generalized): Secondary | ICD-10-CM | POA: Diagnosis not present

## 2018-04-02 DIAGNOSIS — M533 Sacrococcygeal disorders, not elsewhere classified: Secondary | ICD-10-CM

## 2018-04-02 DIAGNOSIS — M25551 Pain in right hip: Secondary | ICD-10-CM

## 2018-04-02 NOTE — Therapy (Signed)
[place], [place], [address] Phone: [phone]   Fax:  [phone]  Physical Therapy Treatment  Patient Details  Name: Marissa Hall MRN: [id number] Date of Birth: 12-Mar-1941 Referring Provider (PT): Dr. Shawnie Dapper   Encounter Date: 04/02/2018  PT End of Session - 04/02/18 1030    Visit Number  7    Number of Visits  12    Date for PT Re-Evaluation  04/10/18    PT Start Time  1022    PT Stop Time  1113    PT Time Calculation (min)  51 min    Activity Tolerance  Patient tolerated treatment well    Behavior During Therapy  [facility] for tasks assessed/performed       Past Medical History:  Diagnosis Date  . Anxiety and depression 1964   oncologist started duloxetine 12/2016  . Breast cancer ([place]) 03/14/2016   Clinical stage 2A: (triple neg): Right breast, upper inner quadrant, 03/2016.  Neoadjuvant chemo x 5 cycles, then lumpectomy 4 mo later, then radiation started 10/2016.  Adjuvant Xeloda Aug through october 2018.  SWOG research trial pt 04/2017--pt randomized to pembrolizumab immunotherapy.  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  . Cataracts, bilateral 07/2017  . 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 Depression 1964   Patient states since age 27  . Diabetes ([place]) 2008   managed by endocrinology.  A1c Mar 12, 2018 was 7.0% at Dr. Shirlyn Goltz.  [name] Kitchen GERD (gastroesophageal reflux disease) 2013  . Hyperlipidemia 1986  . Hypertension 2008  . Hypothyroidism 1988   Diagnosed in her 58s.  Managed by Endocrinologist  . Osteoporosis 2015   pt states "osteopenia", but then says that she refused to take the rx med for this condition, so I suspect she had osteoporosis.  . Peripheral neuropathy 2017   Patient states diabetic neuropathy in feet prior to starting chemotherapy and then worsened by  chemo.   [name] Kitchen TIA (transient ischemic attack) 03/26/2011    Past Surgical History:  Procedure Laterality Date  . ABDOMINAL HYSTERECTOMY  1972  . APPENDECTOMY  1972  . BREAST ENHANCEMENT SURGERY  1982  . BREAST IMPLANT REMOVAL Right 09/13/2016   Procedure: REMOVAL RIGHT BREAST IMPLANT;  Surgeon: Irene Limbo, MD;  Location: [place];  Service: Plastics;  Laterality: Right;  . BREAST LUMPECTOMY WITH RADIOACTIVE SEED AND SENTINEL LYMPH NODE BIOPSY Right 09/13/2016   Procedure: RIGHT BREAST LUMPECTOMY WITH RADIOACTIVE SEED X 2 AND SENTINEL LYMPH NODE BIOPSY;  Surgeon: Alphonsa Overall, MD;  Location: [place];  Service: General;  Laterality: Right;  . BREAST SURGERY Right 03/14/2016   Biopsy  . CAPSULECTOMY Right 09/13/2016   Procedure: RIGHT CAPSULECTOMY;  Surgeon: Irene Limbo, MD;  Location: [place];  Service: Plastics;  Laterality: Right;  . CATARACT EXTRACTION, BILATERAL Bilateral 08/10/17 right eye, 08/31/17 left eye  . MASS EXCISION Left 02/28/2018   Path: benign.  Procedure: EXCISIONLEFT MEDIAL THIGH MASS ERAS PATHWAY;  Surgeon: Erroll Luna, MD;  Location: [place];  Service: General;  Laterality: Left;  . PORTACATH PLACEMENT N/A 03/15/2016   Procedure: INSERTION PORT-A-CATH WITH [place];  Surgeon: Alphonsa Overall, MD;  [facility];  Service: General;  Laterality: N/A;  . PORTACATH REMOVAL  07/2017  . surgical repair left ankle Left 2009   s/p Fall   . TONSILLECTOMY AND  ADENOIDECTOMY  1948   Age 44    There were no vitals filed for this visit.  Subjective Assessment - 04/02/18 1024    Subjective  Hip pain is improving. Little tightness and pain in the right low back and buttock region today.     Pertinent History  having lipoma removed tomorrow,  LLE ankle surgery, diabetes , breast cancer, osteopenia, diabetes and neuropathy     Limitations  Walking;Standing;Lifting;Sitting;Other (comment);House hold activities     How long can you sit comfortably?  30 min     How long can you stand comfortably?  cooked for about 3-4 hours and felt OK, but incr pain the next day     How long can you walk comfortably?  30 min (start and stop)    Diagnostic tests  none     Patient Stated Goals  Patient wants to be able to feel a little more comfortable.     Currently in Pain?  Yes    Pain Score  1     Pain Location  Hip    Pain Orientation  Right    Pain Descriptors / Indicators  Sore;Tightness    Pain Type  Chronic pain    Pain Onset  More than a month ago    Pain Frequency  Intermittent    Aggravating Factors   sitting    Pain Relieving Factors  stretching     Effect of Pain on Daily Activities  limits her mobility                        [facility] Adult PT Treatment/Exercise - 04/02/18 0001      Lumbar Exercises: Stretches   Figure 4 Stretch  2 reps;30 seconds      Lumbar Exercises: Supine   Clam  Limitations    Clam Limitations  2x10 green     Bridge  Limitations    Bridge Limitations  2x10       Knee/Hip Exercises: Standing   Hip Flexion  Stengthening;Right;Left;1 set;10 reps    Hip Abduction  Stengthening;Both;1 set;15 reps    Hip Extension  Stengthening;Both;1 set;15 reps      Moist Heat Therapy   Number Minutes Moist Heat  10 Minutes    Moist Heat Location  Lumbar Spine      Manual Therapy   Manual therapy comments  skilled palpation of trigger points     Soft tissue mobilization  roll out to the glute and IT band;    L5-S1    Manual Traction  inferior glide right        Trigger Point Dry Needling - 04/02/18 1046    Consent Given?  Yes    Education Handout Provided  No    Gluteus Maximus Response  Twitch response elicited    Piriformis Response  Twitch response elicited           PT Education - 04/02/18 1102    Education Details  Reviewed HIP with emphasis on stretches if sore post needling     Person(s) Educated  Patient    Methods   Explanation;Demonstration;Handout;Verbal cues    Comprehension  Verbalized understanding;Returned demonstration          PT Long Term Goals - 03/27/18 1622      PT LONG TERM GOAL #1   Title  Pt will be I with HEP for flexibility and strength     Baseline  up to date  Status  On-going      PT LONG TERM GOAL #2   Title  Pt will complete a balance screen and have a goal set for balance if warranted.     Status  Achieved      PT LONG TERM GOAL #3   Title  Pt will be able to sit for 45 min for meals, rest without increasing pain in Rt buttocks.     Baseline  a bit better     Status  On-going      PT LONG TERM GOAL #4   Title  Pt will become more aware of posture, lifting as she completes ADLs, work tasks (sitting less) and walks    Status  On-going      PT LONG TERM GOAL #5   Title  Pt will be able to sleep with no more than min disturbance of pain from low back/hip.     Baseline  waker her if she lies on her side             Plan - 04/02/18 1031    Clinical Impression Statement  Pt arrived late to appointment. Pt reports improvements in hip pain. Pt with good twitch response to R gluts and piriformis muscle. She was needled in 2 spots in the glut medius/maxims and 1 spot in the pirifromis. She reported feeling cramping after but hen it got better.  Therapy continued with hip strengthening in standing a    Clinical Presentation  Stable    Clinical Decision Making  Low    Rehab Potential  Excellent    PT Frequency  2x / week    PT Duration  6 weeks    PT Treatment/Interventions  ADLs/Self Care Home Management;Cryotherapy;Ultrasound;Moist Heat;Balance training;Dry needling;Therapeutic exercise;Patient/family education;Manual techniques;Passive range of motion;Therapeutic activities;Iontophoresis 4mg /ml Dexamethasone;Electrical Stimulation;Neuromuscular re-education;Taping    PT Next Visit Plan  cont core, DN/manual as needed , hip flexibility,     PT Home Exercise Plan   LTR, hamstring and piriformis 2 ways , bridge and clam     Consulted and Agree with Plan of Care  Patient       Patient will benefit from skilled therapeutic intervention in order to improve the following deficits and impairments:  Decreased balance, Decreased mobility, Impaired sensation, Improper body mechanics, Pain, Postural dysfunction, Impaired flexibility, Impaired UE functional use, Increased fascial restricitons, Decreased strength, Decreased range of motion  Visit Diagnosis: Pain in right hip  Sacroiliac joint pain  Muscle weakness (generalized)  Abnormal posture     Problem List Patient Active Problem List   Diagnosis Date Noted  . Other long term (current) drug therapy 06/14/2017  . Anxiety and depression   . History of breast cancer 03/02/2017  . History of therapeutic radiation 03/02/2017  . Breast pain, right 10/03/2016  . Type 2 diabetes mellitus ([place]) 08/23/2016  . Hypertension associated with diabetes ([place]) 08/23/2016  . Hypothyroidism 08/23/2016  . Port catheter in place 05/09/2016  . Peripheral neuropathy 05/09/2016  . Constipation 04/19/2016  . Breast cancer of upper-inner quadrant of right female breast ([place]) 03/09/2016  . Major depressive disorder, recurrent ([place]) 02/27/2014  . Uncomplicated alcohol dependence ([place]) 02/19/2014    Carney Living PT DPT 04/02/2018, 1:13 PM   Einar Crow SPT  04/02/2018   During this treatment session, the therapist was present, participating in and directing the treatment.    [place], [place], [address] Phone: [phone]   Fax:  [phone]  Name: Marissa Hall MRN: [id number] Date of Birth: [birth year]

## 2018-04-03 ENCOUNTER — Encounter: Payer: Medicare Other | Admitting: Physical Therapy

## 2018-04-10 ENCOUNTER — Ambulatory Visit: Payer: Medicare Other | Attending: Family Medicine | Admitting: Physical Therapy

## 2018-04-10 ENCOUNTER — Encounter: Payer: Self-pay | Admitting: Physical Therapy

## 2018-04-10 DIAGNOSIS — M25551 Pain in right hip: Secondary | ICD-10-CM

## 2018-04-10 DIAGNOSIS — R293 Abnormal posture: Secondary | ICD-10-CM | POA: Insufficient documentation

## 2018-04-10 DIAGNOSIS — M533 Sacrococcygeal disorders, not elsewhere classified: Secondary | ICD-10-CM | POA: Diagnosis not present

## 2018-04-10 DIAGNOSIS — M6281 Muscle weakness (generalized): Secondary | ICD-10-CM | POA: Diagnosis not present

## 2018-04-10 NOTE — Therapy (Addendum)
[place], [place], [address] Phone: [phone]   Fax:  [phone]  Physical Therapy Treatment/Discharge  Patient Details  Name: Marissa Hall MRN: [id number] Date of Birth: 10/06/1940 Referring Provider (PT): Dr. Shawnie Dapper   Encounter Date: 04/10/2018  PT End of Session - 04/10/18 1434    Visit Number  8    Number of Visits  12    Date for PT Re-Evaluation  04/10/18    PT Start Time  1430    PT Stop Time  1513    PT Time Calculation (min)  43 min    Activity Tolerance  Patient tolerated treatment well    Behavior During Therapy  [facility] for tasks assessed/performed       Past Medical History:  Diagnosis Date  . Anxiety and depression 1964   oncologist started duloxetine 12/2016  . Breast cancer ([place]) 03/14/2016   Clinical stage 2A: (triple neg): Right breast, upper inner quadrant, 03/2016.  Neoadjuvant chemo x 5 cycles, then lumpectomy 4 mo later, then radiation started 10/2016.  Adjuvant Xeloda Aug through october 2018.  SWOG research trial pt 04/2017--pt randomized to pembrolizumab immunotherapy.  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  . Cataracts, bilateral 07/2017  . Chemotherapy-induced neuropathy ([place]) 07/04/2016   feet; responding well to cymbalta  . Depression 1964   Patient states since age 77  . Diabetes ([place]) 2008   managed by endocrinology.  A1c Mar 12, 2018 was 7.0% at Dr. Shirlyn Goltz.  [name] Kitchen GERD (gastroesophageal reflux disease) 2013  . Hyperlipidemia 1986  . Hypertension 2008  . Hypothyroidism 1988   Diagnosed in her 77s.  Managed by Endocrinologist  . Osteoporosis 2015   pt states "osteopenia", but then says that she refused to take the rx med for this condition, so I suspect she had osteoporosis.  . Peripheral neuropathy 2017   Patient states diabetic neuropathy in feet prior to starting chemotherapy and then  worsened by chemo.   [name] Kitchen TIA (transient ischemic attack) 03/26/2011    Past Surgical History:  Procedure Laterality Date  . ABDOMINAL HYSTERECTOMY  1972  . APPENDECTOMY  1972  . BREAST ENHANCEMENT SURGERY  1982  . BREAST IMPLANT REMOVAL Right 09/13/2016   Procedure: REMOVAL RIGHT BREAST IMPLANT;  Surgeon: Irene Limbo, MD;  Location: [place];  Service: Plastics;  Laterality: Right;  . BREAST LUMPECTOMY WITH RADIOACTIVE SEED AND SENTINEL LYMPH NODE BIOPSY Right 09/13/2016   Procedure: RIGHT BREAST LUMPECTOMY WITH RADIOACTIVE SEED X 2 AND SENTINEL LYMPH NODE BIOPSY;  Surgeon: Alphonsa Overall, MD;  Location: [place];  Service: General;  Laterality: Right;  . BREAST SURGERY Right 03/14/2016   Biopsy  . CAPSULECTOMY Right 09/13/2016   Procedure: RIGHT CAPSULECTOMY;  Surgeon: Irene Limbo, MD;  Location: [place];  Service: Plastics;  Laterality: Right;  . CATARACT EXTRACTION, BILATERAL Bilateral 08/10/17 right eye, 08/31/17 left eye  . MASS EXCISION Left 02/28/2018   Path: benign.  Procedure: EXCISIONLEFT MEDIAL THIGH MASS ERAS PATHWAY;  Surgeon: Erroll Luna, MD;  Location: [place];  Service: General;  Laterality: Left;  . PORTACATH PLACEMENT N/A 03/15/2016   Procedure: INSERTION PORT-A-CATH WITH [place];  Surgeon: Alphonsa Overall, MD;  Location: [facility];  Service: General;  Laterality: N/A;  . PORTACATH REMOVAL  07/2017  . surgical repair left ankle Left 2009   s/p Fall   . TONSILLECTOMY AND  ADENOIDECTOMY  1948   Age 77    There were no vitals filed for this visit.  Subjective Assessment - 04/10/18 1431    Subjective  This only issue I have is when i get up it is stiff.  Pt late today.      Currently in Pain?  No/denies       MHP 10 min Rt hip , lumbar   [facility] Adult PT Treatment/Exercise - 04/10/18 0001      Lumbar Exercises: Supine   Clam  10 reps    Clam Limitations  unilateral and bilateral each leg     Bridge  10  reps    Bridge Limitations  articulating     Basic Lumbar Stabilization  10 reps    Advanced Lumbar Stabilization Limitations  march, reverse march,, knee extension,8-10 cues for form and technique      Knee/Hip Exercises: Stretches   Piriformis Stretch  Right;3 reps;30 seconds      Manual Therapy   Soft tissue mobilization  Rt glute med, piriformis                   PT Long Term Goals - 04/10/18 1511      PT LONG TERM GOAL #1   Title  Pt will be I with HEP for flexibility and strength     Baseline  given core today     Status  Partially Met      PT LONG TERM GOAL #2   Title  Pt will complete a balance screen and have a goal set for balance if warranted.     Status  Achieved      PT LONG TERM GOAL #3   Title  Pt will be able to sit for 45 min for meals, rest without increasing pain in Rt buttocks.     Baseline  50% better     Status  Partially Met      PT LONG TERM GOAL #4   Title  Pt will become more aware of posture, lifting as she completes ADLs, work tasks (sitting less) and walks    Status  Partially Met      PT LONG TERM GOAL #5   Title  Pt will be able to sleep with no more than min disturbance of pain from low back/hip.     Status  Achieved            Plan - 04/10/18 1433    Clinical Impression Statement  Patient would like to go on hold to see how she does.  She may consider Pilates mat class in 2020 when her finances improve.  Goals partially met. FOTO 2 % improved but does notice less pain overall.      PT Treatment/Interventions  ADLs/Self Care Home Management;Cryotherapy;Ultrasound;Moist Heat;Balance training;Dry needling;Therapeutic exercise;Patient/family education;Manual techniques;Passive range of motion;Therapeutic activities;Iontophoresis 3m/ml Dexamethasone;Electrical Stimulation;Neuromuscular re-education;Taping    PT Next Visit Plan  renew? resume DN/core and flexibility     PT Home Exercise Plan  LTR, hamstring and piriformis 2 ways ,  bridge and clam , butterfly bridge, bent knee fall out, march/scissor     Consulted and Agree with Plan of Care  Patient       Patient will benefit from skilled therapeutic intervention in order to improve the following deficits and impairments:  Decreased balance, Decreased mobility, Impaired sensation, Improper body mechanics, Pain, Postural dysfunction, Impaired flexibility, Impaired UE functional use, Increased fascial restricitons, Decreased strength, Decreased range of motion  Visit  Diagnosis: Pain in right hip  Sacroiliac joint pain  Muscle weakness (generalized)  Abnormal posture     Problem List Patient Active Problem List   Diagnosis Date Noted  . Other long term (current) drug therapy 06/14/2017  . Anxiety and depression   . History of breast cancer 03/02/2017  . History of therapeutic radiation 03/02/2017  . Breast pain, right 10/03/2016  . Type 2 diabetes mellitus ([place]) 08/23/2016  . Hypertension associated with diabetes ([place]) 08/23/2016  . Hypothyroidism 08/23/2016  . Port catheter in place 05/09/2016  . Peripheral neuropathy 05/09/2016  . Constipation 04/19/2016  . Breast cancer of upper-inner quadrant of right female breast ([place]) 03/09/2016  . Major depressive disorder, recurrent ([place]) 02/27/2014  . Uncomplicated alcohol dependence ([place]) 02/19/2014    Siana Panameno 04/10/2018, 3:26 PM  [facility] [address] [place], [place], [address] Phone: [phone]   Fax:  [phone]  Name: Marissa Hall MRN: [id number] Date of Birth: 07-25-1940  Raeford Razor, PT 04/10/18 3:26 PM Phone: [phone] Fax: [phone]   PHYSICAL THERAPY DISCHARGE SUMMARY  Visits from Start of Care: 8  Current functional level related to goals / functional outcomes: See above    Remaining deficits: None limiting function   Education / Equipment: HEP, trigger points, posture, core  Plan: Patient agrees to  discharge.  Patient goals were partially met. Patient is being discharged due to meeting the stated rehab goals.  ?????  Was on HOLD.  Did not call back.   Raeford Razor, PT 05/14/18 2:20 PM Phone: [phone] Fax: [phone]

## 2018-04-19 ENCOUNTER — Ambulatory Visit: Payer: Medicare Other | Admitting: Family Medicine

## 2018-04-20 ENCOUNTER — Other Ambulatory Visit: Payer: Self-pay

## 2018-05-17 ENCOUNTER — Other Ambulatory Visit: Payer: Self-pay

## 2018-05-17 DIAGNOSIS — Z171 Estrogen receptor negative status [ER-]: Principal | ICD-10-CM

## 2018-05-17 DIAGNOSIS — C50211 Malignant neoplasm of upper-inner quadrant of right female breast: Secondary | ICD-10-CM

## 2018-05-18 ENCOUNTER — Telehealth: Payer: Self-pay | Admitting: *Deleted

## 2018-05-18 ENCOUNTER — Other Ambulatory Visit: Payer: Self-pay | Admitting: *Deleted

## 2018-05-18 DIAGNOSIS — Z006 Encounter for examination for normal comparison and control in clinical research program: Secondary | ICD-10-CM

## 2018-05-18 NOTE — Telephone Encounter (Signed)
Called patient to remind her of study visit for lab/MD on Monday 12/16 at 9 am.  Patient confirmed appointment d/t.  Informed patient there will be study questionnaires to complete and research labs on this visit. She verbalized understanding. Thanked patient for her participation and look forward to seeing her on Monday.  Foye Spurling, BSN, RN Clinical Research Nurse 05/18/2018 10:26 AM

## 2018-05-20 NOTE — Assessment & Plan Note (Signed)
02/29/2016: Right breast palpable mass (with silicone implants 2637), 3.5 cm on MRI, additional 3 cm anterior linear enhancement (biopsy 03/14/2016 IDC grade 3); 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  T2 N0 stage 2A clinical stage  Treatment summary: 1. Neoadjuvant chemotherapy with dose dense Adriamycin and Cytoxan 4 followed by Abraxane weekly 5(stopped early due to neuropathy) 2. 09/13/2016: Right lumpectomy: IDC grade 3, 2 foci, 2 cm and 1.1 cm, 0/3 lymph nodes negative margins negative, ER 0%, PR 0%, HER-2 negative ratio 1.02, Ki-67 60%, RCB-II; ypT2ypN0 Stage 2A (with plastic surgery removing the ruptured implant) 3. Followed by radiation therapy completed 12/06/2016 4.Adjuvant Xeloda 1000 mg by mouth twice a day 2 weeks on one week off 12/30/2016-Oct 2018 5.SWOG 1418 clinical trial Pembrolizumabcycle 1 given 05/03/2017(patient decided to discontinue clinical trial) -----------------------------------------------------------------------------------------------------------------------------------------  Surveillance: 1.Breast exam 02/21/2018: Benign 2.mammogram 03/24/2017 at [facility]: No evidence of malignancy. Breast density categoryB Return to clinicin 1 year for follow up  Severe financial distress: Due to patient going through a lawsuit with her ex-husband who has not met his financial obligations to her.

## 2018-05-21 ENCOUNTER — Inpatient Hospital Stay (HOSPITAL_BASED_OUTPATIENT_CLINIC_OR_DEPARTMENT_OTHER): Payer: Medicare Other | Admitting: Hematology and Oncology

## 2018-05-21 ENCOUNTER — Encounter: Payer: Self-pay | Admitting: *Deleted

## 2018-05-21 ENCOUNTER — Telehealth: Payer: Self-pay | Admitting: Hematology and Oncology

## 2018-05-21 ENCOUNTER — Other Ambulatory Visit: Payer: Self-pay | Admitting: Hematology and Oncology

## 2018-05-21 ENCOUNTER — Inpatient Hospital Stay: Payer: Medicare Other | Attending: Radiation Oncology

## 2018-05-21 DIAGNOSIS — Z923 Personal history of irradiation: Secondary | ICD-10-CM | POA: Insufficient documentation

## 2018-05-21 DIAGNOSIS — C50211 Malignant neoplasm of upper-inner quadrant of right female breast: Secondary | ICD-10-CM

## 2018-05-21 DIAGNOSIS — E039 Hypothyroidism, unspecified: Secondary | ICD-10-CM

## 2018-05-21 DIAGNOSIS — Z006 Encounter for examination for normal comparison and control in clinical research program: Secondary | ICD-10-CM | POA: Diagnosis not present

## 2018-05-21 DIAGNOSIS — Z9886 Personal history of breast implant removal: Secondary | ICD-10-CM | POA: Insufficient documentation

## 2018-05-21 DIAGNOSIS — R5383 Other fatigue: Secondary | ICD-10-CM

## 2018-05-21 DIAGNOSIS — Z9221 Personal history of antineoplastic chemotherapy: Secondary | ICD-10-CM

## 2018-05-21 DIAGNOSIS — Z598 Other problems related to housing and economic circumstances: Secondary | ICD-10-CM

## 2018-05-21 DIAGNOSIS — Z171 Estrogen receptor negative status [ER-]: Secondary | ICD-10-CM | POA: Diagnosis not present

## 2018-05-21 LAB — CBC WITH DIFFERENTIAL (CANCER CENTER ONLY)
Abs Immature Granulocytes: 0 10*3/uL (ref 0.00–0.07)
BASOS PCT: 1 %
Basophils Absolute: 0 10*3/uL (ref 0.0–0.1)
Eosinophils Absolute: 0.1 10*3/uL (ref 0.0–0.5)
Eosinophils Relative: 3 %
HEMATOCRIT: 42.1 % (ref 36.0–46.0)
Hemoglobin: 13.8 g/dL (ref 12.0–15.0)
IMMATURE GRANULOCYTES: 0 %
LYMPHS ABS: 1.3 10*3/uL (ref 0.7–4.0)
LYMPHS PCT: 35 %
MCH: 32.5 pg (ref 26.0–34.0)
MCHC: 32.8 g/dL (ref 30.0–36.0)
MCV: 99.3 fL (ref 80.0–100.0)
MONOS PCT: 8 %
Monocytes Absolute: 0.3 10*3/uL (ref 0.1–1.0)
NEUTROS ABS: 1.9 10*3/uL (ref 1.7–7.7)
NEUTROS PCT: 53 %
PLATELETS: 153 10*3/uL (ref 150–400)
RBC: 4.24 MIL/uL (ref 3.87–5.11)
RDW: 13.2 % (ref 11.5–15.5)
WBC Count: 3.6 10*3/uL — ABNORMAL LOW (ref 4.0–10.5)
nRBC: 0 % (ref 0.0–0.2)

## 2018-05-21 LAB — CMP (CANCER CENTER ONLY)
ALT: 45 U/L — AB (ref 0–44)
AST: 38 U/L (ref 15–41)
Albumin: 4.6 g/dL (ref 3.5–5.0)
Alkaline Phosphatase: 74 U/L (ref 38–126)
Anion gap: 10 (ref 5–15)
BUN: 18 mg/dL (ref 8–23)
CHLORIDE: 103 mmol/L (ref 98–111)
CO2: 26 mmol/L (ref 22–32)
CREATININE: 0.92 mg/dL (ref 0.44–1.00)
Calcium: 9.9 mg/dL (ref 8.9–10.3)
GFR, Est AFR Am: >60 mL/min (ref >60)
Glucose, Bld: 133 mg/dL — ABNORMAL HIGH (ref 70–99)
Potassium: 4.7 mmol/L (ref 3.5–5.1)
Sodium: 139 mmol/L (ref 135–145)
Total Bilirubin: 1.4 mg/dL — ABNORMAL HIGH (ref 0.3–1.2)
Total Protein: 7.8 g/dL (ref 6.5–8.1)

## 2018-05-21 LAB — RESEARCH LABS

## 2018-05-21 NOTE — Telephone Encounter (Signed)
Per 12/16 los.  Mailed patient calendar.

## 2018-05-21 NOTE — Research (Signed)
End of Treatment Week/ 22 Visit for [phone] Clinical Trial: PROs; Research nurse greeted patient inlobby and gave her PROs to complete while waiting to check in.  Collected and checked for completeness and accuracy.  Labs;Completed per protocol including research labs for [facility] sub study. Patient also requested thyroid panel to be done for Dr. Hartford Poli (Endocrinologist).  Dr. Lindi Adie agreed to order and research nurse will fax results to Dr. Hartford Poli when complete. Patient was appreciative.  Concomitant medicationsreviewedwith patient;Medication list updated accordingly.  AEs reviewed with patient; Patient denies any new or worsening AEs since last study visit. See AE table below.  H&P, PS, Toxicity and Recurrence assessment;  Completed by Dr. Lindi Adie. Thanked patient very much for her time today andparticipation in this study.  Patient has completed one year on this study and now moves into every 6 month follow up visits. Her next study visit is due 11/20/18. Asked her to call research nurse if any questions or problems prior to next appointment on 11/20/18.  She verbalized understanding.   AEsCycle5- 02/22/18-05/21/18 Event Grade Onset Date End Date Status Comment Attribution toPembrolizumab Immune Related Radiation Related  Back Pain 1 04/10/18  ongoing  unrelated no no  Back Pain 2 12/15/17 04/10/18 resolved Decreased to grade 1 unrelated no no  Insomnia  2 02/09/18  ongoing  unrelated no no  HTN 3 02/22/18 05/21/18 resolved Decreased to grade 1 (below baseline) unrelated no no  Decreased WBC 1 02/22/18  ongoing  unrelated no no  Decreased Platelets 1 02/22/18 05/21/18 resolved  unrelated no no  Left Groin mass 2 12/24/17 02/28/18 resolved Cyst surgically removed unrelated no no  Fatigue 1 02/09/18  ongoing Baseline, intermittent unrelated no no  Increased ALT 1 05/21/18  ongoing Present at Baseline and now recurring unlikely no no  Increased Bilirubin 1 05/21/18  ongoing Present at Baseline and  now recurring unlikely no no  Increased Blirubin 1 12/01/17 02/22/18 resolved Present at Baseline and now recurring unlikely no no   Foye Spurling, BSN, RN Clinical Research Nurse 05/21/2018 10:30 AM

## 2018-05-21 NOTE — Progress Notes (Signed)
 Patient Care Team: McGowen, Philip H, MD as PCP - General (Family Medicine) Newman, David, MD as Consulting Physician (General Surgery) Gudena, Vinay, MD as Consulting Physician (Hematology and Oncology) Moody, John, MD as Consulting Physician (Radiation Oncology) Thimmappa, Brinda, MD as Consulting Physician (Plastic Surgery) Levy, Matthew E, MD as Consulting Physician (Endocrinology) Causey, Lindsey Cornetto, NP as Nurse Practitioner (Hematology and Oncology)  DIAGNOSIS:  Encounter Diagnosis  Name Primary?  . Malignant neoplasm of upper-inner quadrant of right breast in female, estrogen receptor negative (HCC)     SUMMARY OF ONCOLOGIC HISTORY:   Breast cancer of upper-inner quadrant of right female breast (HCC)   02/29/2016 Initial Diagnosis    Right breast palpable mass (with silicone implants 1982), 3.5 cm on MRI, additional 3 cm anterior linear enhancement? 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 Ki-67 60%, T2 N0 stage 2A clinical stage    03/14/2016 Procedure    Right breast biopsy upper inner quadrant: IDC grade 3    03/28/2016 -  Neo-Adjuvant Chemotherapy    Neoadjuvant chemotherapy with dose dense Adriamycin and Cytoxan followed by Abraxane weekly 5 ( patient is diabetic and cannot take steroids)    07/15/2016 Breast MRI    Right breast: Spiculated mass 1.5 cm significantly smaller compared to prior,NME previously seen is not noted, no abnormal lymph nodes    09/13/2016 Surgery    Removal of the silicone implant due to intracapsular rupture and capsulectomy (Dr.Thimappa)    09/13/2016 Surgery    Right lumpectomy: IDC grade 3, 2 foci, 2 cm and 1.1 cm, 0/3 lymph nodes negative margins negative, ER 0%, PR 0%, HER-2 negative ratio 1.02, Ki-67 60%, RCB-II; ypT2ypN0 Stage 2A     10/20/2016 - 12/06/2016 Radiation Therapy    Adjuvant radiation therapy    12/30/2016 - 04/05/2017 Chemotherapy    Xeloda 1000 mg by mouth twice a day adjuvant therapy x 4 cycles     05/03/2017 - 05/24/2017 Chemotherapy    SWOG S 1418 Pembrolizumab on clinical trial stopped after 2 doses by patient preference (not due to toxicities .)     CHIEF COMPLIANT: Follow-up to assess symptoms after stopping SWOG S1418 clinical trial  INTERVAL HISTORY: Marissa Hall is a 77-year-old with above-mentioned history of triple negative breast cancer who finished neoadjuvant chemotherapy followed by lumpectomy radiation and Xeloda.  She took pembrolizumab for 2 cycles and discontinued it.  She is here for surveillance checkup.  She reports no new problems or concerns.  She has chronic fatigue but it has not affected her activities.  She had dry needling to the back which helped her back pain tremendously.  She also had a sebaceous cyst removed from the inner side of her thigh.  REVIEW OF SYSTEMS:   Constitutional: Denies fevers, chills or abnormal weight loss Eyes: Denies blurriness of vision Ears, nose, mouth, throat, and face: Denies mucositis or sore throat Respiratory: Denies cough, dyspnea or wheezes Cardiovascular: Denies palpitation, chest discomfort Gastrointestinal:  Denies nausea, heartburn or change in bowel habits Skin: Denies abnormal skin rashes Lymphatics: Denies new lymphadenopathy or easy bruising Neurological:Denies numbness, tingling or new weaknesses Behavioral/Psych: Mood is stable, no new changes  Extremities: No lower extremity edema Breast:  denies any pain or lumps or nodules in either breasts All other systems were reviewed with the patient and are negative.  I have reviewed the past medical history, past surgical history, social history and family history with the patient and they are unchanged from previous note.  ALLERGIES:  is   allergic to other and penicillins.  MEDICATIONS:  Current Outpatient Medications  Medication Sig Dispense Refill  . atorvastatin (LIPITOR) 80 MG tablet Take 80 mg by mouth every evening.     [name] Kitchen azelastine (ASTELIN) 0.1 % nasal  spray Place 2 sprays into both nostrils daily as needed for rhinitis or allergies. Use in each nostril as directed    . ibuprofen (ADVIL,MOTRIN) 100 MG tablet Take 600 mg by mouth as needed for fever.    [name] Kitchen ibuprofen (ADVIL,MOTRIN) 800 MG tablet Take 1 tablet (800 mg total) by mouth every 8 (eight) hours as needed. 30 tablet 0  . levothyroxine (SYNTHROID, LEVOTHROID) 112 MCG tablet Take 1 tablet by mouth daily.    [name] Kitchen lisinopril (PRINIVIL,ZESTRIL) 20 MG tablet Take 20 mg by mouth every evening.     . metFORMIN (GLUCOPHAGE-XR) 500 MG 24 hr tablet Take 1,000 mg by mouth 2 (two) times daily at 10 AM and 5 PM.     . omeprazole (PRILOSEC) 40 MG capsule Take 1 capsule (40 mg total) by mouth daily. (Patient taking differently: Take 40 mg as needed by mouth (Heart Burn). ) 30 capsule 2  . pyridoxine (B-6) 100 MG tablet Take 100 mg by mouth daily.     . traZODone (DESYREL) 50 MG tablet 1-3 tabs po qhs prn insomnia 60 tablet 1   No current facility-administered medications for this visit.     PHYSICAL EXAMINATION: ECOG PERFORMANCE STATUS: 1 - Symptomatic but completely ambulatory  Vitals:   05/21/18 0936  BP: 138/69  Pulse: (!) 58  Resp: 18  Temp: 98 F (36.7 C)  SpO2: 100%   Filed Weights   05/21/18 0936  Weight: 171 lb (77.6 kg)    GENERAL:alert, no distress and comfortable SKIN: skin color, texture, turgor are normal, no rashes or significant lesions EYES: normal, Conjunctiva are pink and non-injected, sclera clear OROPHARYNX:no exudate, no erythema and lips, buccal mucosa, and tongue normal  NECK: supple, thyroid normal size, non-tender, without nodularity LYMPH:  no palpable lymphadenopathy in the cervical, axillary or inguinal LUNGS: clear to auscultation and percussion with normal breathing effort HEART: regular rate & rhythm and no murmurs and no lower extremity edema ABDOMEN:abdomen soft, non-tender and normal bowel sounds MUSCULOSKELETAL:no cyanosis of digits and no clubbing    NEURO: alert & oriented x 3 with fluent speech, no focal motor/sensory deficits EXTREMITIES: No lower extremity edema BREAST: No palpable masses or nodules in either right or left breasts. No palpable axillary supraclavicular or infraclavicular adenopathy no breast tenderness or nipple discharge. (exam performed in the presence of a chaperone)  LABORATORY DATA:  I have reviewed the data as listed CMP Latest Ref Rng & Units 02/22/2018 12/01/2017 09/06/2017  Glucose 70 - 99 mg/dL 153(H) 163(H) 177(H)  BUN 8 - 23 mg/dL _0 Creatinine 0.44 - 1.00 mg/dL 0.79 0.77 0.83  Sodium 135 - 145 mmol/L 140 140 135(L)  Potassium 3.5 - 5.1 mmol/L 4.6 4.8 4.3  Chloride 98 - 111 mmol/L 105 105 102  CO2 22 - 32 mmol/L _1 Calcium 8.9 - 10.3 mg/dL 9.5 9.9 9.3  Total Protein 6.5 - 8.1 g/dL 7.2 7.2 6.7  Total Bilirubin 0.3 - 1.2 mg/dL 1.0 1.3(H) 0.9  Alkaline Phos 38 - 126 U/L 74 63 73  AST 15 - 41 U/L 28 56(H) 62(H)  ALT 0 - 44 U/L 36 54(H) 48    Lab Results  Component Value Date   WBC 3.6 (L) 05/21/2018  HGB 13.8 05/21/2018   HCT 42.1 05/21/2018   MCV 99.3 05/21/2018   PLT 153 05/21/2018   NEUTROABS 1.9 05/21/2018    ASSESSMENT & PLAN:  Breast cancer of upper-inner quadrant of right female breast ([place]) 02/29/2016: Right breast palpable mass (with silicone implants 0071), 3.5 cm on MRI, additional 3 cm anterior linear enhancement (biopsy 03/14/2016 IDC grade 3); 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  T2 N0 stage 2A clinical stage  Treatment summary: 1. Neoadjuvant chemotherapy with dose dense Adriamycin and Cytoxan 4 followed by Abraxane weekly 5(stopped early due to neuropathy) 2. 09/13/2016: Right lumpectomy: IDC grade 3, 2 foci, 2 cm and 1.1 cm, 0/3 lymph nodes negative margins negative, ER 0%, PR 0%, HER-2 negative ratio 1.02, Ki-67 60%, RCB-II; ypT2ypN0 Stage 2A (with plastic surgery removing the ruptured implant) 3. Followed by radiation therapy completed  12/06/2016 4.Adjuvant Xeloda 1000 mg by mouth twice a day 2 weeks on one week off 12/30/2016-Oct 2018 5.SWOG 1418 clinical trial Pembrolizumabcycle 1 given 05/03/2017(patient decided to discontinue clinical trial) -----------------------------------------------------------------------------------------------------------------------------------------  Surveillance: 1.Breast exam 02/21/2018: Benign 2.mammogram 04/12/2018 at [facility]: No evidence of malignancy. Breast density categoryB Return to clinicin 1 year for follow up  Symptoms: Fatigue but it is not affecting her activities. No evidence of breast cancer recurrence. Severe financial distress: She is trying to make ends meet. Return to clinic in 6 months for follow-up.   No orders of the defined types were placed in this encounter.  The patient has a good understanding of the overall plan. she agrees with it. she will call with any problems that may develop before the next visit here.   Harriette Ohara, MD 05/21/18

## 2018-05-22 ENCOUNTER — Telehealth: Payer: Self-pay | Admitting: Hematology and Oncology

## 2018-05-22 ENCOUNTER — Telehealth: Payer: Self-pay | Admitting: *Deleted

## 2018-05-22 LAB — THYROID PANEL WITH TSH
Free Thyroxine Index: 1.6 (ref 1.2–4.9)
T3 UPTAKE RATIO: 24 % (ref 24–39)
T4 TOTAL: 6.6 ug/dL (ref 4.5–12.0)
TSH: 16.53 u[IU]/mL — ABNORMAL HIGH (ref 0.450–4.500)

## 2018-05-22 NOTE — Telephone Encounter (Signed)
TSH results 16.530 and faxed copy of Thyroid panel to Dr. Shirlyn Goltz office.  Called patient to inform her of elevated TSH and instructed her to follow up with Dr. Shirlyn Goltz office today for possible dose change in her Levothyroxine.  Patient verbalized understanding and will contact Dr. Hartford Poli for further instructions.  Foye Spurling, BSN, RN Clinical Research Nurse 05/22/2018 9:06 AM

## 2018-05-22 NOTE — Telephone Encounter (Signed)
I called the patient to inform her of her TSH levels.  It appears that she already received phone calls from our research nurse as well as Dr. Oren Binet and they are figuring out how to best take care of her thyroid dysfunction.

## 2018-06-03 ENCOUNTER — Encounter: Payer: Self-pay | Admitting: Family Medicine

## 2018-06-04 ENCOUNTER — Encounter: Payer: Self-pay | Admitting: Family Medicine

## 2018-07-17 ENCOUNTER — Telehealth: Payer: Self-pay | Admitting: *Deleted

## 2018-07-17 DIAGNOSIS — G8929 Other chronic pain: Secondary | ICD-10-CM

## 2018-07-17 DIAGNOSIS — M25551 Pain in right hip: Secondary | ICD-10-CM

## 2018-07-17 DIAGNOSIS — M533 Sacrococcygeal disorders, not elsewhere classified: Principal | ICD-10-CM

## 2018-07-17 NOTE — Telephone Encounter (Signed)
OK, PT referral ordered. 

## 2018-07-17 NOTE — Telephone Encounter (Signed)
Pt advised and voiced understanding.   

## 2018-07-17 NOTE — Telephone Encounter (Signed)
Please advise. Thanks.  

## 2018-07-17 NOTE — Telephone Encounter (Signed)
Copied from [place]. Topic: Referral - Question >> Jul 16, 2018  4:16 PM Ahmed Prima L wrote: Reason for CRM: patient would like to know could she have another referral for physical therapy over on [facility]. She said her issue was fine in her hip and now its come back. She called them and they told her to call Dr Anitra Lauth

## 2018-07-30 ENCOUNTER — Ambulatory Visit: Payer: Medicare Other | Attending: Family Medicine | Admitting: Physical Therapy

## 2018-07-30 ENCOUNTER — Encounter: Payer: Self-pay | Admitting: Physical Therapy

## 2018-07-30 ENCOUNTER — Other Ambulatory Visit: Payer: Self-pay

## 2018-07-30 DIAGNOSIS — R2689 Other abnormalities of gait and mobility: Secondary | ICD-10-CM | POA: Diagnosis not present

## 2018-07-30 DIAGNOSIS — M6281 Muscle weakness (generalized): Secondary | ICD-10-CM | POA: Diagnosis not present

## 2018-07-30 DIAGNOSIS — R293 Abnormal posture: Secondary | ICD-10-CM | POA: Diagnosis not present

## 2018-07-30 DIAGNOSIS — M25551 Pain in right hip: Secondary | ICD-10-CM | POA: Insufficient documentation

## 2018-07-31 NOTE — Patient Instructions (Addendum)
Patient advised to do stretches from before. She is doing a single knee to chest stretch, a poiriformis stretch and a hamstring stretch at home. Patient also shown how to use a rolling pin on her IT band

## 2018-07-31 NOTE — Therapy (Deleted)
Marissa Hall, [place], [address] Phone: [phone]   Fax:  [phone]  Physical Therapy Treatment  Patient Details  Name: Marissa Hall MRN: [id number] Date of Birth: 1940/10/29 Referring Provider (PT): Dr Ricardo Jericho    Encounter Date: 07/30/2018  PT End of Session - 07/30/18 1614    Visit Number  1    Number of Visits  12    Date for PT Re-Evaluation  09/10/18    Authorization Type  Medicare BCBS     PT Start Time  1603    PT Stop Time  1630    PT Time Calculation (min)  27 min    Activity Tolerance  Patient tolerated treatment well    Behavior During Therapy  [facility] for tasks assessed/performed       Past Medical History:  Diagnosis Date  . Anxiety and depression 1964   oncologist started duloxetine 12/2016  . Breast cancer ([place]) 03/14/2016   Clinical stage 2A: (triple neg): Right breast, upper inner quadrant, 03/2016.  Neoadjuvant chemo x 5 cycles,lumpectomy 4 mo later, then RT started 10/2016.  Adjuvant Xeloda [id number].  SWOG research trial pt 04/2017--pt randomized to pembrolizumab immunotherapy.  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  [name] Kitchen Cataracts, bilateral 07/2017  . 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 Depression 1964   Patient states since age 39  . Diabetes ([place]) 2008   managed by endocrinology.  A1c Mar 12, 2018 was 7.0% at Dr. Shirlyn Goltz.  [name] Kitchen GERD (gastroesophageal reflux disease) 2013  . Hyperlipidemia 1986  . Hypertension 2008  . Hypothyroidism 1988   Diagnosed in her 30s.  Managed by Endocrinologist  . Osteoporosis 2015   pt states "osteopenia", but then says that she refused to take the rx med for this condition, so I suspect she had osteoporosis.  . Peripheral neuropathy 2017   Patient states diabetic neuropathy in feet prior to starting  chemotherapy and then worsened by chemo.   [name] Kitchen TIA (transient ischemic attack) 03/26/2011    Past Surgical History:  Procedure Laterality Date  . ABDOMINAL HYSTERECTOMY  1972  . APPENDECTOMY  1972  . BREAST ENHANCEMENT SURGERY  1982  . BREAST IMPLANT REMOVAL Right 09/13/2016   Procedure: REMOVAL RIGHT BREAST IMPLANT;  Surgeon: Irene Limbo, MD;  Location: [place];  Service: Plastics;  Laterality: Right;  . BREAST LUMPECTOMY WITH RADIOACTIVE SEED AND SENTINEL LYMPH NODE BIOPSY Right 09/13/2016   Procedure: RIGHT BREAST LUMPECTOMY WITH RADIOACTIVE SEED X 2 AND SENTINEL LYMPH NODE BIOPSY;  Surgeon: Alphonsa Overall, MD;  Location: [place];  Service: General;  Laterality: Right;  . BREAST SURGERY Right 03/14/2016   Biopsy  . CAPSULECTOMY Right 09/13/2016   Procedure: RIGHT CAPSULECTOMY;  Surgeon: Irene Limbo, MD;  Location: [place];  Service: Plastics;  Laterality: Right;  . CATARACT EXTRACTION, BILATERAL Bilateral 08/10/17 right eye, 08/31/17 left eye  . MASS EXCISION Left 02/28/2018   Path: benign.  Procedure: EXCISIONLEFT MEDIAL THIGH MASS ERAS PATHWAY;  Surgeon: Erroll Luna, MD;  Location: [place];  Service: General;  Laterality: Left;  . PORTACATH PLACEMENT N/A 03/15/2016   Procedure: INSERTION PORT-A-CATH WITH [place];  Surgeon: Alphonsa Overall, MD;  [facility];  Service: General;  Laterality: N/A;  . PORTACATH REMOVAL  07/2017  . surgical repair left ankle Left 2009  s/p Fall   . TONSILLECTOMY AND ADENOIDECTOMY  1948   Age 14    There were no vitals filed for this visit.  Subjective Assessment - 07/30/18 1606    Subjective  About 3 weeks ago the patient begn having right hip pain that at times radiates down her leg. The patiens pain is worse in the mornings and at night. She has a deep aching pain. Her pain is also worse when sh e is up and standing.     Pertinent History  having lipoma removed tomorrow,  LLE  ankle surgery, diabetes , breast cancer, osteopenia, diabetes and neuropathy     Limitations  Walking;Standing;Lifting;Sitting;Other (comment);House hold activities    How long can you stand comfortably?  cooked for about 3-4 hours and felt OK, but incr pain the next day     How long can you walk comfortably?  30 min (start and stop)    Diagnostic tests  none     Currently in Pain?  Yes    Pain Score  6     Pain Location  Hip    Pain Orientation  Right;Left    Pain Descriptors / Indicators  Aching    Pain Type  Chronic pain    Pain Onset  More than a month ago    Pain Frequency  Intermittent    Aggravating Factors   sitting    Pain Relieving Factors  stretching     Effect of Pain on Daily Activities  limits her mobility          [facility] PT Assessment - 07/31/18 0001      Assessment   Medical Diagnosis  Left Hip Pain     Referring Provider (PT)  Dr Ricardo Jericho     Onset Date/Surgical Date  --   3 weeks prior    Hand Dominance  Right    Next MD Visit  Nothing scheduled at this time.     Prior Therapy  Had therapy in the fall of 2019 which improved her pain       Precautions   Precaution Comments  is on a clinical trial for cancer       Restrictions   Weight Bearing Restrictions  No      Balance Screen   Has the patient fallen in the past 6 months  No    Has the patient had a decrease in activity level because of a fear of falling?   No    Is the patient reluctant to leave their home because of a fear of falling?   No      Home Environment   Living Environment  Private residence    Living Arrangements  Alone    Type of [place] to enter    Entrance Stairs-Number of Steps  3     Entrance Stairs-Rails  Right    Home Layout  One level;Laundry or work area in basement    Additional Comments  has stairs to go down into the basement       Prior Function   Level of Independence  Independent    Vocation  Part time employment    Curator   Overall Cognitive Status  Within Functional Limits for tasks assessed    Attention  Focused    Focused Attention  Appears intact    Memory  Appears intact  Awareness  Appears intact    Problem Solving  Appears intact      Observation/Other Assessments   Observations  right PSIS elevated       Sensation   Light Touch  Impaired by gross assessment      Coordination   Gross Motor Movements are Fluid and Coordinated  Yes    Fine Motor Movements are Fluid and Coordinated  Yes      Posture/Postural Control   Posture Comments  forward head       ROM / Strength   AROM / PROM / Strength  AROM;PROM;Strength      AROM   Overall AROM Comments  Pain with active hip flexion       PROM   Overall PROM Comments  Pain with end range hip flexion on the right      Strength   Strength Assessment Site  Hip    Right Hip Flexion  3/5    Right Hip ABduction  3+/5    Right Hip ADduction  4+/5    Left Hip Flexion  5/5    Left Hip ABduction  5/5    Left Hip ADduction  5/5      Flexibility   Soft Tissue Assessment /Muscle Length  yes    ITB  trigger points don her IT band     Piriformis  tighter R> L       Palpation   Palpation comment  tenderness to palpation from her lateral gluteals into her IT band and around trochanter       Special Tests   Other special tests  Hip Scour test (-) FABER test (+)       Ambulation/Gait   Gait Comments  lateral movement of right LE in swing phase; raight trendelenburgh; decreased right single leg stance                   [facility] Adult PT Treatment/Exercise - 07/31/18 0001      Exercises   Exercises  --   reviewed self soft tissue mobilization with roller            PT Education - 07/31/18 1001    Education Details  reviewed HEP, symptom mangement     Person(s) Educated  Patient    Methods  Explanation;Demonstration;Tactile cues;Verbal cues    Comprehension  Verbalized  understanding;Returned demonstration;Verbal cues required;Tactile cues required       PT Short Term Goals - 07/30/18 1647      PT SHORT TERM GOAL #1   Title  Patient will increase right hip strength to 4/5     Time  3    Period  Weeks    Status  New    Target Date  08/20/18      PT SHORT TERM GOAL #2   Title  Patient will demonstrate full pain free right hip ROM    Time  3    Period  Weeks    Status  New    Target Date  08/21/18      PT SHORT TERM GOAL #3   Title  Patient will be independent with basic stretching and strengthening program     Time  3    Period  Weeks    Status  New    Target Date  08/21/18        PT Long Term Goals - 07/30/18 1645      PT LONG TERM GOAL #1   Title  Patient  will sleep through the night without increased pain     Time  6    Period  Weeks    Status  New      PT LONG TERM GOAL #2   Title  Patient will ambualte 3000' without increased pain     Time  6    Period  Weeks    Status  New      PT LONG TERM GOAL #3   Title  Patieatient witll demonstrate a 40% limitation on FOTO     Time  6    Period  Weeks    Status  New    Target Date  08/20/18            Plan - 07/30/18 1616    Clinical Impression Statement  Patient is a 78 year old female with incidious onset of right hip pain starting 3 weeks ago. Sings and symptoms are consitent with piriformis syndrome and trochanteric bursitis. The pain at times radiates down her right lateral hip towards the right knee. She has increased pain in the morning and night. She has a significant strength difference between the left and right. She has decreased righthip flexion and decreased single leg stance time on the right with ambualtion. She feels like she is scuffing her right foot when she walks. She would benefit from skilled therapy to reduce trigger points in her hip and IT band, and to improve gait mechaincs.     History and Personal Factors relevant to plan of care:  active breast cancer      Clinical Presentation  Evolving    Clinical Presentation due to:  increasing pain pover the past 2 weeks     Clinical Decision Making  Moderate    Rehab Potential  Excellent    PT Frequency  2x / week    PT Duration  6 weeks    PT Treatment/Interventions  ADLs/Self Care Home Management;Cryotherapy;Ultrasound;Moist Heat;Balance training;Dry needling;Therapeutic exercise;Patient/family education;Manual techniques;Passive range of motion;Therapeutic activities;Iontophoresis 4mg /ml Dexamethasone;Electrical Stimulation;Neuromuscular re-education;Taping    PT Next Visit Plan  assess leg length, add in light hip stability strengthening; consider supine march ; supine clam; quad set with glut set; LA; progress to standing when able. Manual therapy to IT band and hip. Consider TPDN; Consdier manul LAD;     PT Home Exercise Plan  reviewed rollewr to IT band and glute. Limited HEP 2nd to patient being 15 minutes late for her appointment    Consulted and Agree with Plan of Care  Patient       Patient will benefit from skilled therapeutic intervention in order to improve the following deficits and impairments:  Decreased balance, Decreased mobility, Impaired sensation, Improper body mechanics, Pain, Postural dysfunction, Impaired flexibility, Impaired UE functional use, Increased fascial restricitons, Decreased strength, Decreased range of motion, Decreased activity tolerance, Decreased endurance  Visit Diagnosis: Pain in right hip  Other abnormalities of gait and mobility  Muscle weakness (generalized)  Abnormal posture     Problem List Patient Active Problem List   Diagnosis Date Noted  . Other long term (current) drug therapy 06/14/2017  . Anxiety and depression   . History of breast cancer 03/02/2017  . History of therapeutic radiation 03/02/2017  . Breast pain, right 10/03/2016  . Type 2 diabetes mellitus ([place]) 08/23/2016  . Hypertension associated with diabetes ([place]) 08/23/2016  .  Hypothyroidism 08/23/2016  . Port catheter in place 05/09/2016  . Peripheral neuropathy 05/09/2016  . Constipation 04/19/2016  . Breast cancer  of upper-inner quadrant of right female breast ([place]) 03/09/2016  . Major depressive disorder, recurrent ([place]) 02/27/2014  . Uncomplicated alcohol dependence ([place]) 02/19/2014    Carney Living 07/31/2018, 1:29 PM  [facility] [address] [place], [place], [address] Phone: [phone]   Fax:  [phone]  Name: Marissa Hall MRN: [id number] Date of Birth: Oct 20, 1940

## 2018-07-31 NOTE — Therapy (Signed)
[place], [place], [address] Phone: [phone]   Fax:  [phone]  Physical Therapy Evaluation  Patient Details  Name: Marissa Hall MRN: [id number] Date of Birth: 04-19-1941 Referring Provider (PT): Dr Ricardo Jericho    Encounter Date: 07/30/2018  PT End of Session - 07/30/18 1614    Visit Number  1    Number of Visits  12    Date for PT Re-Evaluation  09/10/18    Authorization Type  Medicare BCBS     PT Start Time  1603    PT Stop Time  1630    PT Time Calculation (min)  27 min    Activity Tolerance  Patient tolerated treatment well    Behavior During Therapy  [facility] for tasks assessed/performed       Past Medical History:  Diagnosis Date  . Anxiety and depression 1964   oncologist started duloxetine 12/2016  . Breast cancer ([place]) 03/14/2016   Clinical stage 2A: (triple neg): Right breast, upper inner quadrant, 03/2016.  Neoadjuvant chemo x 5 cycles,lumpectomy 4 mo later, then RT started 10/2016.  Adjuvant Xeloda [id number].  SWOG research trial pt 04/2017--pt randomized to pembrolizumab immunotherapy.  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  [name] Kitchen Cataracts, bilateral 07/2017  . 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 Depression 1964   Patient states since age 59  . Diabetes ([place]) 2008   managed by endocrinology.  A1c Mar 12, 2018 was 7.0% at Dr. Shirlyn Goltz.  [name] Kitchen GERD (gastroesophageal reflux disease) 2013  . Hyperlipidemia 1986  . Hypertension 2008  . Hypothyroidism 1988   Diagnosed in her 30s.  Managed by Endocrinologist  . Osteoporosis 2015   pt states "osteopenia", but then says that she refused to take the rx med for this condition, so I suspect she had osteoporosis.  . Peripheral neuropathy 2017   Patient states diabetic neuropathy in feet prior to starting  chemotherapy and then worsened by chemo.   [name] Kitchen TIA (transient ischemic attack) 03/26/2011    Past Surgical History:  Procedure Laterality Date  . ABDOMINAL HYSTERECTOMY  1972  . APPENDECTOMY  1972  . BREAST ENHANCEMENT SURGERY  1982  . BREAST IMPLANT REMOVAL Right 09/13/2016   Procedure: REMOVAL RIGHT BREAST IMPLANT;  Surgeon: Irene Limbo, MD;  Location: [place];  Service: Plastics;  Laterality: Right;  . BREAST LUMPECTOMY WITH RADIOACTIVE SEED AND SENTINEL LYMPH NODE BIOPSY Right 09/13/2016   Procedure: RIGHT BREAST LUMPECTOMY WITH RADIOACTIVE SEED X 2 AND SENTINEL LYMPH NODE BIOPSY;  Surgeon: Alphonsa Overall, MD;  Location: [place];  Service: General;  Laterality: Right;  . BREAST SURGERY Right 03/14/2016   Biopsy  . CAPSULECTOMY Right 09/13/2016   Procedure: RIGHT CAPSULECTOMY;  Surgeon: Irene Limbo, MD;  Location: [place];  Service: Plastics;  Laterality: Right;  . CATARACT EXTRACTION, BILATERAL Bilateral 08/10/17 right eye, 08/31/17 left eye  . MASS EXCISION Left 02/28/2018   Path: benign.  Procedure: EXCISIONLEFT MEDIAL THIGH MASS ERAS PATHWAY;  Surgeon: Erroll Luna, MD;  Location: [place];  Service: General;  Laterality: Left;  . PORTACATH PLACEMENT N/A 03/15/2016   Procedure: INSERTION PORT-A-CATH WITH [place];  Surgeon: Alphonsa Overall, MD;  [facility];  Service: General;  Laterality: N/A;  . PORTACATH REMOVAL  07/2017  . surgical repair left ankle Left 2009  s/p Fall   . TONSILLECTOMY AND ADENOIDECTOMY  1948   Age 78    There were no vitals filed for this visit.   Subjective Assessment - 07/30/18 1606    Subjective  About 3 weeks ago the patient begn having right hip pain that at times radiates down her leg. The patiens pain is worse in the mornings and at night. She has a deep aching pain. Her pain is also worse when sh e is up and standing.     Pertinent History  having lipoma removed tomorrow,  LLE  ankle surgery, diabetes , breast cancer, osteopenia, diabetes and neuropathy     Limitations  Walking;Standing;Lifting;Sitting;Other (comment);House hold activities    How long can you stand comfortably?  cooked for about 3-4 hours and felt OK, but incr pain the next day     How long can you walk comfortably?  30 min (start and stop)    Diagnostic tests  none     Currently in Pain?  Yes    Pain Score  6     Pain Location  Hip    Pain Orientation  Right;Left    Pain Descriptors / Indicators  Aching    Pain Type  Chronic pain    Pain Onset  More than a month ago    Pain Frequency  Intermittent    Aggravating Factors   sitting    Pain Relieving Factors  stretching     Effect of Pain on Daily Activities  limits her mobility          [facility] PT Assessment - 07/31/18 0001      Assessment   Medical Diagnosis  Left Hip Pain     Referring Provider (PT)  Dr Ricardo Jericho     Onset Date/Surgical Date  --   3 weeks prior    Hand Dominance  Right    Next MD Visit  Nothing scheduled at this time.     Prior Therapy  Had therapy in the fall of 2019 which improved her pain       Precautions   Precaution Comments  is on a clinical trial for cancer       Restrictions   Weight Bearing Restrictions  No      Balance Screen   Has the patient fallen in the past 6 months  No    Has the patient had a decrease in activity level because of a fear of falling?   No    Is the patient reluctant to leave their home because of a fear of falling?   No      Home Environment   Living Environment  Private residence    Living Arrangements  Alone    Type of [place] to enter    Entrance Stairs-Number of Steps  3     Entrance Stairs-Rails  Right    Home Layout  One level;Laundry or work area in basement    Additional Comments  has stairs to go down into the basement       Prior Function   Level of Independence  Independent    Vocation  Part time employment    Curator   Overall Cognitive Status  Within Functional Limits for tasks assessed    Attention  Focused    Focused Attention  Appears intact    Memory  Appears intact  Awareness  Appears intact    Problem Solving  Appears intact      Observation/Other Assessments   Observations  right PSIS elevated       Sensation   Light Touch  Impaired by gross assessment      Coordination   Gross Motor Movements are Fluid and Coordinated  Yes    Fine Motor Movements are Fluid and Coordinated  Yes      Posture/Postural Control   Posture Comments  forward head       ROM / Strength   AROM / PROM / Strength  AROM;PROM;Strength      AROM   Overall AROM Comments  Pain with active hip flexion       PROM   Overall PROM Comments  Pain with end range hip flexion on the right      Strength   Strength Assessment Site  Hip    Right Hip Flexion  3/5    Right Hip ABduction  3+/5    Right Hip ADduction  4+/5    Left Hip Flexion  5/5    Left Hip ABduction  5/5    Left Hip ADduction  5/5      Flexibility   Soft Tissue Assessment /Muscle Length  yes    ITB  trigger points don her IT band     Piriformis  tighter R> L       Palpation   Palpation comment  tenderness to palpation from her lateral gluteals into her IT band and around trochanter       Special Tests   Other special tests  Hip Scour test (-) FABER test (+)       Ambulation/Gait   Gait Comments  lateral movement of right LE in swing phase; raight trendelenburgh; decreased right single leg stance                Objective measurements completed on examination: See above findings.      [place] Adult PT Treatment/Exercise - 07/31/18 0001      Exercises   Exercises  --   reviewed self soft tissue mobilization with roller            PT Education - 07/31/18 1001    Education Details  reviewed HEP, symptom mangement     Person(s) Educated  Patient    Methods   Explanation;Demonstration;Tactile cues;Verbal cues    Comprehension  Verbalized understanding;Returned demonstration;Verbal cues required;Tactile cues required       PT Short Term Goals - 07/30/18 1647      PT SHORT TERM GOAL #1   Title  Patient will increase right hip strength to 4/5     Time  3    Period  Weeks    Status  New    Target Date  08/20/18      PT SHORT TERM GOAL #2   Title  Patient will demonstrate full pain free right hip ROM    Time  3    Period  Weeks    Status  New    Target Date  08/21/18      PT SHORT TERM GOAL #3   Title  Patient will be independent with basic stretching and strengthening program     Time  3    Period  Weeks    Status  New    Target Date  08/21/18        PT Long Term Goals - 07/30/18 1645  PT LONG TERM GOAL #1   Title  Patient will sleep through the night without increased pain     Time  6    Period  Weeks    Status  New      PT LONG TERM GOAL #2   Title  Patient will ambualte 3000' without increased pain     Time  6    Period  Weeks    Status  New      PT LONG TERM GOAL #3   Title  Patieatient witll demonstrate a 40% limitation on FOTO     Time  6    Period  Weeks    Status  New    Target Date  08/20/18             Plan - 07/30/18 1616    Clinical Impression Statement  Patient is a 78 year old female with incidious onset of right hip pain starting 3 weeks ago. Sings and symptoms are consitent with piriformis syndrome and trochanteric bursitis. The pain at times radiates down her right lateral hip towards the right knee. She has increased pain in the morning and night. She has a significant strength difference between the left and right. She has decreased righthip flexion and decreased single leg stance time on the right with ambualtion. She feels like she is scuffing her right foot when she walks. She would benefit from skilled therapy to reduce trigger points in her hip and IT band, and to improve gait mechaincs.      History and Personal Factors relevant to plan of care:  active breast cancer     Clinical Presentation  Evolving    Clinical Presentation due to:  increasing pain pover the past 2 weeks     Clinical Decision Making  Moderate    Rehab Potential  Excellent    PT Frequency  2x / week    PT Duration  6 weeks    PT Treatment/Interventions  ADLs/Self Care Home Management;Cryotherapy;Ultrasound;Moist Heat;Balance training;Dry needling;Therapeutic exercise;Patient/family education;Manual techniques;Passive range of motion;Therapeutic activities;Iontophoresis 4mg /ml Dexamethasone;Electrical Stimulation;Neuromuscular re-education;Taping    PT Next Visit Plan  assess leg length, add in light hip stability strengthening; consider supine march ; supine clam; quad set with glut set; LA; progress to standing when able. Manual therapy to IT band and hip. Consider TPDN; Consdier manul LAD;     PT Home Exercise Plan  reviewed rollewr to IT band and glute. Limited HEP 2nd to patient being 15 minutes late for her appointment    Consulted and Agree with Plan of Care  Patient       Patient will benefit from skilled therapeutic intervention in order to improve the following deficits and impairments:  Decreased balance, Decreased mobility, Impaired sensation, Improper body mechanics, Pain, Postural dysfunction, Impaired flexibility, Impaired UE functional use, Increased fascial restricitons, Decreased strength, Decreased range of motion, Decreased activity tolerance, Decreased endurance  Visit Diagnosis: Pain in right hip  Other abnormalities of gait and mobility  Muscle weakness (generalized)  Abnormal posture     Problem List Patient Active Problem List   Diagnosis Date Noted  . Other long term (current) drug therapy 06/14/2017  . Anxiety and depression   . History of breast cancer 03/02/2017  . History of therapeutic radiation 03/02/2017  . Breast pain, right 10/03/2016  . Type 2 diabetes  mellitus ([place]) 08/23/2016  . Hypertension associated with diabetes ([place]) 08/23/2016  . Hypothyroidism 08/23/2016  . Port catheter in place 05/09/2016  .  Peripheral neuropathy 05/09/2016  . Constipation 04/19/2016  . Breast cancer of upper-inner quadrant of right female breast ([place]) 03/09/2016  . Major depressive disorder, recurrent ([place]) 02/27/2014  . Uncomplicated alcohol dependence ([place]) 02/19/2014    Carney Living PT DPT  07/31/2018, 1:29 PM  [facility] [address] [place], [place], [address] Phone: [phone]   Fax:  [phone]  Name: Rhiannon Sassaman MRN: [id number] Date of Birth: 13-Aug-1940

## 2018-08-06 ENCOUNTER — Ambulatory Visit: Payer: Medicare Other | Attending: Family Medicine | Admitting: Physical Therapy

## 2018-08-06 ENCOUNTER — Encounter: Payer: Self-pay | Admitting: Physical Therapy

## 2018-08-06 DIAGNOSIS — R293 Abnormal posture: Secondary | ICD-10-CM

## 2018-08-06 DIAGNOSIS — M6281 Muscle weakness (generalized): Secondary | ICD-10-CM | POA: Diagnosis not present

## 2018-08-06 DIAGNOSIS — M533 Sacrococcygeal disorders, not elsewhere classified: Secondary | ICD-10-CM | POA: Diagnosis not present

## 2018-08-06 DIAGNOSIS — R2689 Other abnormalities of gait and mobility: Secondary | ICD-10-CM | POA: Diagnosis not present

## 2018-08-06 DIAGNOSIS — M25551 Pain in right hip: Secondary | ICD-10-CM | POA: Insufficient documentation

## 2018-08-06 NOTE — Therapy (Signed)
[place], [place], [address] Phone: [phone]   Fax:  [phone]  Physical Therapy Treatment  Patient Details  Name: Marissa Hall MRN: [id number] Date of Birth: 07-21-1940 Referring Provider (PT): Dr Ricardo Jericho    Encounter Date: 08/06/2018  PT End of Session - 08/06/18 1018    Visit Number  2    Number of Visits  12    Date for PT Re-Evaluation  09/10/18    Authorization Type  Medicare BCBS     PT Start Time  1015    PT Stop Time  1115    PT Time Calculation (min)  60 min    Activity Tolerance  Patient tolerated treatment well    Behavior During Therapy  [facility] for tasks assessed/performed       Past Medical History:  Diagnosis Date  . Anxiety and depression 1964   oncologist started duloxetine 12/2016  . Breast cancer ([place]) 03/14/2016   Clinical stage 2A: (triple neg): Right breast, upper inner quadrant, 03/2016.  Neoadjuvant chemo x 5 cycles,lumpectomy 4 mo later, then RT started 10/2016.  Adjuvant Xeloda [id number].  SWOG research trial pt 04/2017--pt randomized to pembrolizumab immunotherapy.  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  [name] Kitchen Cataracts, bilateral 07/2017  . 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 Depression 1964   Patient states since age 48  . Diabetes ([place]) 2008   managed by endocrinology.  A1c Mar 12, 2018 was 7.0% at Dr. Shirlyn Goltz.  [name] Kitchen GERD (gastroesophageal reflux disease) 2013  . Hyperlipidemia 1986  . Hypertension 2008  . Hypothyroidism 1988   Diagnosed in her 15s.  Managed by Endocrinologist  . Osteoporosis 2015   pt states "osteopenia", but then says that she refused to take the rx med for this condition, so I suspect she had osteoporosis.  . Peripheral neuropathy 2017   Patient states diabetic neuropathy in feet prior to starting  chemotherapy and then worsened by chemo.   [name] Kitchen TIA (transient ischemic attack) 03/26/2011    Past Surgical History:  Procedure Laterality Date  . ABDOMINAL HYSTERECTOMY  1972  . APPENDECTOMY  1972  . BREAST ENHANCEMENT SURGERY  1982  . BREAST IMPLANT REMOVAL Right 09/13/2016   Procedure: REMOVAL RIGHT BREAST IMPLANT;  Surgeon: Irene Limbo, MD;  Location: [place];  Service: Plastics;  Laterality: Right;  . BREAST LUMPECTOMY WITH RADIOACTIVE SEED AND SENTINEL LYMPH NODE BIOPSY Right 09/13/2016   Procedure: RIGHT BREAST LUMPECTOMY WITH RADIOACTIVE SEED X 2 AND SENTINEL LYMPH NODE BIOPSY;  Surgeon: Alphonsa Overall, MD;  Location: [place];  Service: General;  Laterality: Right;  . BREAST SURGERY Right 03/14/2016   Biopsy  . CAPSULECTOMY Right 09/13/2016   Procedure: RIGHT CAPSULECTOMY;  Surgeon: Irene Limbo, MD;  Location: [place];  Service: Plastics;  Laterality: Right;  . CATARACT EXTRACTION, BILATERAL Bilateral 08/10/17 right eye, 08/31/17 left eye  . MASS EXCISION Left 02/28/2018   Path: benign.  Procedure: EXCISIONLEFT MEDIAL THIGH MASS ERAS PATHWAY;  Surgeon: Erroll Luna, MD;  Location: [place];  Service: General;  Laterality: Left;  . PORTACATH PLACEMENT N/A 03/15/2016   Procedure: INSERTION PORT-A-CATH WITH [place];  Surgeon: Alphonsa Overall, MD;  [facility];  Service: General;  Laterality: N/A;  . PORTACATH REMOVAL  07/2017  . surgical repair left ankle Left 2009  s/p Fall   . TONSILLECTOMY AND ADENOIDECTOMY  1948   Age 78    There were no vitals filed for this visit.  Subjective Assessment - 08/06/18 1016    Subjective  Was on my feet for about 4-5 hours Friday and it hurt really bad. Has weak feeling in low back when she tries to stand back up from bending.      Currently in Pain?  Yes    Pain Score  1     Pain Location  Hip    Pain Orientation  Right    Pain Descriptors / Indicators  Aching    Pain  Type  Chronic pain    Pain Onset  More than a month ago         [facility] PT Assessment - 08/06/18 0001      Precautions   Precautions  Other (comment)    Precaution Comments  Feb 2018 finished Rx (rad and Chemo) only 2 yrs    avoid modalties other than MHP to hip, back         [facility] Adult PT Treatment/Exercise - 08/06/18 0001      Lumbar Exercises: Stretches   Active Hamstring Stretch  Right;3 reps;30 seconds    Single Knee to Chest Stretch  Right;1 rep;30 seconds    Lower Trunk Rotation Limitations  knees wide     Hip Flexor Stretch  Right;1 rep;30 seconds      Knee/Hip Exercises: Stretches   ITB Stretch  Right;3 reps;30 seconds    Piriformis Stretch  Right;1 rep;30 seconds    Piriformis Stretch Limitations  3 ways       Knee/Hip Exercises: Aerobic   Nustep  L5 UE and LE for 6 min       Knee/Hip Exercises: Sidelying   Clams  x 15     Other Sidelying Knee/Hip Exercises  reverse clam x 15       Moist Heat Therapy   Number Minutes Moist Heat  10 Minutes    Moist Heat Location  Hip      Manual Therapy   Soft tissue mobilization  Rt hip surrounding Gr Troch, Rolling with Tiger Tail to Rt ITB , piriformis and gluteals in prone , very sore and tender, Compression to piriformis with Rt hip ER/IR passive     Manual Traction  Rt LE , LAD x 5 , 20 to 30 sec hold with slight ER of hip                PT Short Term Goals - 07/30/18 1647      PT SHORT TERM GOAL #1   Title  Patient will increase right hip strength to 4/5     Time  3    Period  Weeks    Status  New    Target Date  08/20/18      PT SHORT TERM GOAL #2   Title  Patient will demonstrate full pain free right hip ROM    Time  3    Period  Weeks    Status  New    Target Date  08/21/18      PT SHORT TERM GOAL #3   Title  Patient will be independent with basic stretching and strengthening program     Time  3    Period  Weeks    Status  New    Target Date  08/21/18        PT Long Term Goals -  07/30/18 1645      PT LONG TERM GOAL #1   Title  Patient will sleep through the night without increased pain     Time  6    Period  Weeks    Status  New      PT LONG TERM GOAL #2   Title  Patient will ambualte 3000' without increased pain     Time  6    Period  Weeks    Status  New      PT LONG TERM GOAL #3   Title  Patieatient witll demonstrate a 40% limitation on FOTO     Time  6    Period  Weeks    Status  New    Target Date  08/20/18            Plan - 08/06/18 1251    Clinical Impression Statement  Tiffiany has been using Ibuprofen at night and that has helped her.  She continues to do hip stretches each day before she gets out of bed . Reissued them today.  Tightness and pain in Rt piriformis and glute medius noted.  Sore throughout ITB as well.  No pain post session.     PT Treatment/Interventions  ADLs/Self Care Home Management;Cryotherapy;Ultrasound;Moist Heat;Balance training;Dry needling;Therapeutic exercise;Patient/family education;Manual techniques;Passive range of motion;Therapeutic activities;Iontophoresis 4mg /ml Dexamethasone;Electrical Stimulation;Neuromuscular re-education;Taping    PT Next Visit Plan  assess leg length, add in light hip stability strengthening; consider supine march ; supine clam; quad set with glut set; LA; progress to standing when able. Manual therapy to IT band and hip. Consider TPDN; Consdier manul LAD;     PT Home Exercise Plan  Hamstring, ITB, knee to opp shoulder (piriformis)    Consulted and Agree with Plan of Care  Patient       Patient will benefit from skilled therapeutic intervention in order to improve the following deficits and impairments:  Decreased balance, Decreased mobility, Impaired sensation, Improper body mechanics, Pain, Postural dysfunction, Impaired flexibility, Impaired UE functional use, Increased fascial restricitons, Decreased strength, Decreased range of motion, Decreased activity tolerance, Decreased  endurance  Visit Diagnosis: Pain in right hip  Other abnormalities of gait and mobility  Muscle weakness (generalized)  Abnormal posture  Sacroiliac joint pain     Problem List Patient Active Problem List   Diagnosis Date Noted  . Other long term (current) drug therapy 06/14/2017  . Anxiety and depression   . History of breast cancer 03/02/2017  . History of therapeutic radiation 03/02/2017  . Breast pain, right 10/03/2016  . Type 2 diabetes mellitus ([place]) 08/23/2016  . Hypertension associated with diabetes ([place]) 08/23/2016  . Hypothyroidism 08/23/2016  . Port catheter in place 05/09/2016  . Peripheral neuropathy 05/09/2016  . Constipation 04/19/2016  . Breast cancer of upper-inner quadrant of right female breast ([place]) 03/09/2016  . Major depressive disorder, recurrent ([place]) 02/27/2014  . Uncomplicated alcohol dependence ([place]) 02/19/2014    Gavriela Cashin 08/06/2018, 12:56 PM  [facility] [address] [place], [place], [address] Phone: [phone]   Fax:  [phone]  Name: Toula Miyasaki MRN: [id number] Date of Birth: May 21, 1941  Raeford Razor, PT 08/06/18 12:56 PM Phone: [phone] Fax: [phone]

## 2018-08-08 ENCOUNTER — Ambulatory Visit: Payer: Medicare Other | Admitting: Physical Therapy

## 2018-08-08 ENCOUNTER — Encounter: Payer: Self-pay | Admitting: Physical Therapy

## 2018-08-08 DIAGNOSIS — M6281 Muscle weakness (generalized): Secondary | ICD-10-CM

## 2018-08-08 DIAGNOSIS — M533 Sacrococcygeal disorders, not elsewhere classified: Secondary | ICD-10-CM

## 2018-08-08 DIAGNOSIS — R2689 Other abnormalities of gait and mobility: Secondary | ICD-10-CM | POA: Diagnosis not present

## 2018-08-08 DIAGNOSIS — R293 Abnormal posture: Secondary | ICD-10-CM | POA: Diagnosis not present

## 2018-08-08 DIAGNOSIS — M25551 Pain in right hip: Secondary | ICD-10-CM | POA: Diagnosis not present

## 2018-08-09 ENCOUNTER — Encounter: Payer: Self-pay | Admitting: Physical Therapy

## 2018-08-09 NOTE — Therapy (Signed)
[place], [place], [address] Phone: [phone]   Fax:  [phone]  Physical Therapy Treatment  Patient Details  Name: Marissa Hall MRN: [id number] Date of Birth: 1941/04/10 Referring Provider (PT): Dr Ricardo Jericho    Encounter Date: 08/08/2018  PT End of Session - 08/08/18 2128    Visit Number  3    Number of Visits  12    Date for PT Re-Evaluation  09/10/18    Authorization Type  Medicare BCBS     PT Start Time  1100    PT Stop Time  1150    PT Time Calculation (min)  50 min    Activity Tolerance  Patient tolerated treatment well    Behavior During Therapy  [facility] for tasks assessed/performed       Past Medical History:  Diagnosis Date  . Anxiety and depression 1964   oncologist started duloxetine 12/2016  . Breast cancer ([place]) 03/14/2016   Clinical stage 2A: (triple neg): Right breast, upper inner quadrant, 03/2016.  Neoadjuvant chemo x 5 cycles,lumpectomy 4 mo later, then RT started 10/2016.  Adjuvant Xeloda [id number].  SWOG research trial pt 04/2017--pt randomized to pembrolizumab immunotherapy.  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  [name] Kitchen Cataracts, bilateral 07/2017  . 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 Depression 1964   Patient states since age 7  . Diabetes ([place]) 2008   managed by endocrinology.  A1c Mar 12, 2018 was 7.0% at Dr. Shirlyn Goltz.  [name] Kitchen GERD (gastroesophageal reflux disease) 2013  . Hyperlipidemia 1986  . Hypertension 2008  . Hypothyroidism 1988   Diagnosed in her 38s.  Managed by Endocrinologist  . Osteoporosis 2015   pt states "osteopenia", but then says that she refused to take the rx med for this condition, so I suspect she had osteoporosis.  . Peripheral neuropathy 2017   Patient states diabetic neuropathy in feet prior to starting  chemotherapy and then worsened by chemo.   [name] Kitchen TIA (transient ischemic attack) 03/26/2011    Past Surgical History:  Procedure Laterality Date  . ABDOMINAL HYSTERECTOMY  1972  . APPENDECTOMY  1972  . BREAST ENHANCEMENT SURGERY  1982  . BREAST IMPLANT REMOVAL Right 09/13/2016   Procedure: REMOVAL RIGHT BREAST IMPLANT;  Surgeon: Irene Limbo, MD;  Location: [place];  Service: Plastics;  Laterality: Right;  . BREAST LUMPECTOMY WITH RADIOACTIVE SEED AND SENTINEL LYMPH NODE BIOPSY Right 09/13/2016   Procedure: RIGHT BREAST LUMPECTOMY WITH RADIOACTIVE SEED X 2 AND SENTINEL LYMPH NODE BIOPSY;  Surgeon: Alphonsa Overall, MD;  Location: [place];  Service: General;  Laterality: Right;  . BREAST SURGERY Right 03/14/2016   Biopsy  . CAPSULECTOMY Right 09/13/2016   Procedure: RIGHT CAPSULECTOMY;  Surgeon: Irene Limbo, MD;  Location: [place];  Service: Plastics;  Laterality: Right;  . CATARACT EXTRACTION, BILATERAL Bilateral 08/10/17 right eye, 08/31/17 left eye  . MASS EXCISION Left 02/28/2018   Path: benign.  Procedure: EXCISIONLEFT MEDIAL THIGH MASS ERAS PATHWAY;  Surgeon: Erroll Luna, MD;  Location: [place];  Service: General;  Laterality: Left;  . PORTACATH PLACEMENT N/A 03/15/2016   Procedure: INSERTION PORT-A-CATH WITH [place];  Surgeon: Alphonsa Overall, MD;  [facility];  Service: General;  Laterality: N/A;  . PORTACATH REMOVAL  07/2017  . surgical repair left ankle Left 2009  s/p Fall   . TONSILLECTOMY AND ADENOIDECTOMY  1948   Age 48    There were no vitals filed for this visit.  Subjective Assessment - 08/08/18 2125    Subjective  Patient reports she is sore today. She reports it flairs up at times. She cotinues to feel like she is having weakness.     Pertinent History  having lipoma removed tomorrow,  LLE ankle surgery, diabetes , breast cancer, osteopenia, diabetes and neuropathy     Limitations   Walking;Standing;Lifting;Sitting;Other (comment);House hold activities    How long can you sit comfortably?  30 min     How long can you stand comfortably?  cooked for about 3-4 hours and felt OK, but incr pain the next day     How long can you walk comfortably?  30 min (start and stop)    Diagnostic tests  none     Patient Stated Goals  Patient wants to be able to feel a little more comfortable.     Currently in Pain?  Yes    Pain Score  4     Pain Location  Hip    Pain Orientation  Right    Pain Descriptors / Indicators  Aching    Pain Type  Chronic pain    Pain Onset  More than a month ago    Pain Frequency  Intermittent    Aggravating Factors   sitting     Pain Relieving Factors  stretching     Effect of Pain on Daily Activities  limites her mobility                        [facility] Adult PT Treatment/Exercise - 08/09/18 0001      Lumbar Exercises: Stretches   Active Hamstring Stretch  Right;3 reps;30 seconds    Single Knee to Chest Stretch  Right;1 rep;30 seconds    Lower Trunk Rotation Limitations  knees wide     Hip Flexor Stretch  Right;1 rep;30 seconds      Knee/Hip Exercises: Supine   Bridges Limitations  2x10     Other Supine Knee/Hip Exercises  supine clam shell x20       Manual Therapy   Manual Therapy  Soft tissue mobilization    Soft tissue mobilization  roll out to the glute and IT band; triggger point release to the hip and lumbar spine    L5-S1    Manual Traction  Rt LE , LAD x 5 , 20 to 30 sec hold with slight ER of hip        Trigger Point Dry Needling - 08/09/18 0001    Consent Given?  Yes    Education Handout Provided  Previously provided    Muscles Treated Back/Hip  Gluteus medius;Gluteus maximus    Gluteus Maximus Response  Twitch response elicited    Piriformis Response  Twitch response elicited           PT Education - 08/08/18 2128    Education Details  benefits and risks of TPDN     Person(s) Educated  Patient    Methods   Explanation;Demonstration;Tactile cues;Verbal cues    Comprehension  Verbalized understanding;Returned demonstration;Verbal cues required;Tactile cues required;Need further instruction       PT Short Term Goals - 07/30/18 1647      PT SHORT TERM GOAL #1   Title  Patient will increase right hip strength to 4/5     Time  3  Period  Weeks    Status  New    Target Date  08/20/18      PT SHORT TERM GOAL #2   Title  Patient will demonstrate full pain free right hip ROM    Time  3    Period  Weeks    Status  New    Target Date  08/21/18      PT SHORT TERM GOAL #3   Title  Patient will be independent with basic stretching and strengthening program     Time  3    Period  Weeks    Status  New    Target Date  08/21/18        PT Long Term Goals - 07/30/18 1645      PT LONG TERM GOAL #1   Title  Patient will sleep through the night without increased pain     Time  6    Period  Weeks    Status  New      PT LONG TERM GOAL #2   Title  Patient will ambualte 3000' without increased pain     Time  6    Period  Weeks    Status  New      PT LONG TERM GOAL #3   Title  Patieatient witll demonstrate a 40% limitation on FOTO     Time  6    Period  Weeks    Status  New    Target Date  08/20/18            Plan - 08/08/18 2129    Clinical Impression Statement  Patient tolerated trigger point dry needling well. She had a good twtich respose with both needles to the glut medius and maximu. After the second needle she declined further needleding. Therapy focused on manual therapy and reviewed strengthening exercises. She was strongly enocurgaed to be consitent with exercises and stretching at home.     Clinical Decision Making  Moderate    Rehab Potential  Good    PT Frequency  2x / week    PT Duration  6 weeks    PT Treatment/Interventions  ADLs/Self Care Home Management;Cryotherapy;Ultrasound;Moist Heat;Balance training;Dry needling;Therapeutic exercise;Patient/family  education;Manual techniques;Passive range of motion;Therapeutic activities;Iontophoresis 4mg /ml Dexamethasone;Electrical Stimulation;Neuromuscular re-education;Taping    PT Next Visit Plan  assess leg length, add in light hip stability strengthening; consider supine march ; supine clam; quad set with glut set; LA; progress to standing when able. Manual therapy to IT band and hip. Consider TPDN; Consdier manul LAD;     PT Home Exercise Plan  Hamstring, ITB, knee to opp shoulder (piriformis)    Consulted and Agree with Plan of Care  Patient       Patient will benefit from skilled therapeutic intervention in order to improve the following deficits and impairments:  Decreased balance, Decreased mobility, Impaired sensation, Improper body mechanics, Pain, Postural dysfunction, Impaired flexibility, Impaired UE functional use, Increased fascial restricitons, Decreased strength, Decreased range of motion, Decreased activity tolerance, Decreased endurance  Visit Diagnosis: Pain in right hip  Other abnormalities of gait and mobility  Muscle weakness (generalized)  Abnormal posture  Sacroiliac joint pain     Problem List Patient Active Problem List   Diagnosis Date Noted  . Other long term (current) drug therapy 06/14/2017  . Anxiety and depression   . History of breast cancer 03/02/2017  . History of therapeutic radiation 03/02/2017  . Breast pain, right 10/03/2016  . Type 2 diabetes mellitus (  [place]) 08/23/2016  . Hypertension associated with diabetes ([place]) 08/23/2016  . Hypothyroidism 08/23/2016  . Port catheter in place 05/09/2016  . Peripheral neuropathy 05/09/2016  . Constipation 04/19/2016  . Breast cancer of upper-inner quadrant of right female breast ([place]) 03/09/2016  . Major depressive disorder, recurrent ([place]) 02/27/2014  . Uncomplicated alcohol dependence ([place]) 02/19/2014    Carney Living PT DPT  08/09/2018, 9:51 AM  [facility] [address] [name], [place], [address] Phone: [phone]   Fax:  [phone]  Name: Malini Flemings MRN: [id number] Date of Birth: 1940/09/15

## 2018-08-14 ENCOUNTER — Ambulatory Visit: Payer: Medicare Other | Admitting: Physical Therapy

## 2018-08-14 ENCOUNTER — Encounter: Payer: Self-pay | Admitting: Physical Therapy

## 2018-08-14 DIAGNOSIS — M6281 Muscle weakness (generalized): Secondary | ICD-10-CM

## 2018-08-14 DIAGNOSIS — M25551 Pain in right hip: Secondary | ICD-10-CM | POA: Diagnosis not present

## 2018-08-14 DIAGNOSIS — R293 Abnormal posture: Secondary | ICD-10-CM | POA: Diagnosis not present

## 2018-08-14 DIAGNOSIS — M533 Sacrococcygeal disorders, not elsewhere classified: Secondary | ICD-10-CM

## 2018-08-14 DIAGNOSIS — R2689 Other abnormalities of gait and mobility: Secondary | ICD-10-CM | POA: Diagnosis not present

## 2018-08-15 NOTE — Therapy (Signed)
[place], [place], [address] Phone: [phone]   Fax:  [phone]  Physical Therapy Treatment  Patient Details  Name: Marissa Hall MRN: [id number] Date of Birth: [birth year] Referring Provider (PT): Dr Ricardo Jericho    Encounter Date: 08/14/2018  PT End of Session - 08/14/18 1548    Visit Number  4    Number of Visits  12    Date for PT Re-Evaluation  09/10/18    Authorization Type  Medicare BCBS     PT Start Time  9767    PT Stop Time  1645    PT Time Calculation (min)  60 min    Activity Tolerance  Patient tolerated treatment well    Behavior During Therapy  [facility] for tasks assessed/performed       Past Medical History:  Diagnosis Date  . Anxiety and depression 1964   oncologist started duloxetine 12/2016  . Breast cancer ([place]) 03/14/2016   Clinical stage 2A: (triple neg): Right breast, upper inner quadrant, 03/2016.  Neoadjuvant chemo x 5 cycles,lumpectomy 4 mo later, then RT started 10/2016.  Adjuvant Xeloda [id number].  SWOG research trial pt 04/2017--pt randomized to pembrolizumab immunotherapy.  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  [name] Kitchen Cataracts, bilateral 07/2017  . 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 Depression 1964   Patient states since age 32  . Diabetes ([place]) 2008   managed by endocrinology.  A1c Mar 12, 2018 was 7.0% at Dr. Shirlyn Goltz.  [name] Kitchen GERD (gastroesophageal reflux disease) 2013  . Hyperlipidemia 1986  . Hypertension 2008  . Hypothyroidism 1988   Diagnosed in her 48s.  Managed by Endocrinologist  . Osteoporosis 2015   pt states "osteopenia", but then says that she refused to take the rx med for this condition, so I suspect she had osteoporosis.  . Peripheral neuropathy 2017   Patient states diabetic neuropathy in feet prior to starting  chemotherapy and then worsened by chemo.   [name] Kitchen TIA (transient ischemic attack) 03/26/2011    Past Surgical History:  Procedure Laterality Date  . ABDOMINAL HYSTERECTOMY  1972  . APPENDECTOMY  1972  . BREAST ENHANCEMENT SURGERY  1982  . BREAST IMPLANT REMOVAL Right 09/13/2016   Procedure: REMOVAL RIGHT BREAST IMPLANT;  Surgeon: Irene Limbo, MD;  Location: [place];  Service: Plastics;  Laterality: Right;  . BREAST LUMPECTOMY WITH RADIOACTIVE SEED AND SENTINEL LYMPH NODE BIOPSY Right 09/13/2016   Procedure: RIGHT BREAST LUMPECTOMY WITH RADIOACTIVE SEED X 2 AND SENTINEL LYMPH NODE BIOPSY;  Surgeon: Alphonsa Overall, MD;  Location: [place];  Service: General;  Laterality: Right;  . BREAST SURGERY Right 03/14/2016   Biopsy  . CAPSULECTOMY Right 09/13/2016   Procedure: RIGHT CAPSULECTOMY;  Surgeon: Irene Limbo, MD;  Location: [place];  Service: Plastics;  Laterality: Right;  . CATARACT EXTRACTION, BILATERAL Bilateral 08/10/17 right eye, 08/31/17 left eye  . MASS EXCISION Left 02/28/2018   Path: benign.  Procedure: EXCISIONLEFT MEDIAL THIGH MASS ERAS PATHWAY;  Surgeon: Erroll [place], MD;  Location: [place];  Service: General;  Laterality: Left;  . PORTACATH PLACEMENT N/A 03/15/2016   Procedure: INSERTION PORT-A-CATH WITH [place];  Surgeon: Alphonsa Overall, MD;  [facility];  Service: General;  Laterality: N/A;  . PORTACATH REMOVAL  07/2017  . surgical repair left ankle Left 2009  s/p Fall   . TONSILLECTOMY AND ADENOIDECTOMY  1948   Age 78    There were no vitals filed for this visit.  Subjective Assessment - 08/14/18 1554    Subjective  Sleepy as she started Cymbalta and it has made her so tired.  No pain but feel weak and sore when I go to get up, takes me a min to straighten up.     Currently in Pain?  No/denies         [facility] PT Assessment - 08/14/18 0001      Assessment   Medical Diagnosis  Rt hip pain      Referring Provider (PT)  Dr Ricardo Jericho     Hand Dominance  Right         [facility] Adult PT Treatment/Exercise - 08/15/18 0001      Self-Care   Other Self-Care Comments   sleep hygiene, encouraged a structured plan for exercise, done during exercise and manual       Lumbar Exercises: Stretches   Piriformis Stretch  3 reps;30 seconds    Piriformis Stretch Limitations  seated     Figure 4 Stretch  3 reps;30 seconds    Figure 4 Stretch Limitations  seated       Lumbar Exercises: Machines for Strengthening   Leg Press  1 plate x 15 used ball for alignment     Other Lumbar Machine Exercise  1 plate x 15 with wide stance slight turnout      Lumbar Exercises: Prone   Straight Leg Raise  10 reps    Straight Leg Raises Limitations  knee bent x 10 hip extension       Moist Heat Therapy   Number Minutes Moist Heat  15 Minutes    Moist Heat Location  Hip      Manual Therapy   Joint Mobilization  anterior capsule in prone, post capsule in supine , no pain     Soft tissue mobilization  glute medius, piriformis     Manual Traction  Rt LE LAD x 5, 2 min oscillations               PT Short Term Goals - 08/14/18 1548      PT SHORT TERM GOAL #1   Title  Patient will increase right hip strength to 4/5     Status  On-going      PT SHORT TERM GOAL #2   Title  Patient will demonstrate full pain free right hip ROM    Baseline  improving with AROM and PROM , min pain with FABER and FADIR (anterior)     Status  On-going      PT SHORT TERM GOAL #3   Title  Patient will be independent with basic stretching and strengthening program     Baseline  basic , done in bed     Status  Achieved        PT Long Term Goals - 08/15/18 0555      PT LONG TERM GOAL #1   Title  Patient will sleep through the night without increased pain     Status  Unable to assess      PT LONG TERM GOAL #2   Title  Patient will ambualte 3000' without increased pain     Status  Unable to assess      PT  LONG TERM GOAL #3   Title  Patieatient witll demonstrate a 40% limitation on FOTO  Status  Unable to assess      PT LONG TERM GOAL #4   Title  Pt will become more aware of posture, lifting as she completes ADLs, work tasks (sitting less) and walks    Status  Partially Met            Plan - 08/14/18 1556    Clinical Impression Statement  Patient having increased somnelence with addition of a new medication  She has since taken herself off the medication.  She does her stretching daily but could benefit from increased physcial activity, stress mgmt and we talked about this today.  With work she finds it hard to have the time and energy. She had increased tightness in Rt superior glute, increased mobility post manual in all planes.     PT Treatment/Interventions  ADLs/Self Care Home Management;Cryotherapy;Ultrasound;Moist Heat;Balance training;Dry needling;Therapeutic exercise;Patient/family education;Manual techniques;Passive range of motion;Therapeutic activities;Iontophoresis 1m/ml Dexamethasone;Electrical Stimulation;Neuromuscular re-education;Taping    PT Next Visit Plan  push cardio to begin.  add in more standing if able to strengthen, load.  Cont manual/LAD  and DN if needed.     PT Home Exercise Plan  Hamstring, ITB, knee to opp shoulder (piriformis)    Consulted and Agree with Plan of Care  Patient       Patient will benefit from skilled therapeutic intervention in order to improve the following deficits and impairments:  Decreased balance, Decreased mobility, Impaired sensation, Improper body mechanics, Pain, Postural dysfunction, Impaired flexibility, Impaired UE functional use, Increased fascial restricitons, Decreased strength, Decreased range of motion, Decreased activity tolerance, Decreased endurance  Visit Diagnosis: Pain in right hip  Other abnormalities of gait and mobility  Muscle weakness (generalized)  Abnormal posture  Sacroiliac joint pain     Problem  List Patient Active Problem List   Diagnosis Date Noted  . Other long term (current) drug therapy 06/14/2017  . Anxiety and depression   . History of breast cancer 03/02/2017  . History of therapeutic radiation 03/02/2017  . Breast pain, right 10/03/2016  . Type 2 diabetes mellitus ([place] 08/23/2016  . Hypertension associated with diabetes ([place] 08/23/2016  . Hypothyroidism 08/23/2016  . Port catheter in place 05/09/2016  . Peripheral neuropathy 05/09/2016  . Constipation 04/19/2016  . Breast cancer of upper-inner quadrant of right female breast ([place] 03/09/2016  . Major depressive disorder, recurrent ([place] 02/27/2014  . Uncomplicated alcohol dependence ([place] 02/19/2014    Rolena Knutson 08/15/2018, 5:56 AM  [facility] [address]Phone: [phone]  Fax:  [phone] Name: BCherrise OcchipintiMRN: [id number] of Birth: 1April 26, 1942 JRaeford Razor PT 08/15/18 5:57 AM Phone: [phone]: [phone]

## 2018-08-16 ENCOUNTER — Encounter: Payer: Self-pay | Admitting: Physical Therapy

## 2018-08-16 ENCOUNTER — Ambulatory Visit: Payer: Medicare Other | Admitting: Physical Therapy

## 2018-08-16 ENCOUNTER — Other Ambulatory Visit: Payer: Self-pay

## 2018-08-16 DIAGNOSIS — E039 Hypothyroidism, unspecified: Secondary | ICD-10-CM | POA: Diagnosis not present

## 2018-08-16 DIAGNOSIS — R2689 Other abnormalities of gait and mobility: Secondary | ICD-10-CM | POA: Diagnosis not present

## 2018-08-16 DIAGNOSIS — M25551 Pain in right hip: Secondary | ICD-10-CM | POA: Diagnosis not present

## 2018-08-16 DIAGNOSIS — M6281 Muscle weakness (generalized): Secondary | ICD-10-CM

## 2018-08-16 DIAGNOSIS — M533 Sacrococcygeal disorders, not elsewhere classified: Secondary | ICD-10-CM

## 2018-08-16 DIAGNOSIS — R293 Abnormal posture: Secondary | ICD-10-CM | POA: Diagnosis not present

## 2018-08-17 NOTE — Therapy (Signed)
[place], [place], [address] Phone: [phone]   Fax:  [phone]  Physical Therapy Treatment  Patient Details  Name: Marissa Hall MRN: [id number] Date of Birth: [birth year] Referring Provider (PT): Dr Marissa Hall    Encounter Date: 08/16/2018  PT End of Session - 08/16/18 1633    Visit Number  5    Number of Visits  12    Date for PT Re-Evaluation  09/10/18    Authorization Type  Medicare BCBS     PT Start Time  0737    PT Stop Time  1640    PT Time Calculation (min)  55 min    Activity Tolerance  Patient tolerated treatment well    Behavior During Therapy  Marissa Hall for tasks assessed/performed       Past Medical History:  Diagnosis Date  . Anxiety and depression 1964   oncologist started duloxetine 12/2016  . Breast cancer ([place]) 03/14/2016   Clinical stage 2A: (triple neg): Right breast, upper inner quadrant, 03/2016.  Neoadjuvant chemo x 5 cycles,lumpectomy 4 mo later, then RT started 10/2016.  Adjuvant Xeloda [id number].  SWOG research trial pt 04/2017--pt randomized to pembrolizumab immunotherapy.  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  [name] Kitchen Cataracts, bilateral 07/2017  . 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 Depression 1964   Patient states since age 78  . Diabetes ([place]) 2008   managed by endocrinology.  A1c Mar 12, 2018 was 7.0% at Dr. Shirlyn Goltz.  [name] Kitchen GERD (gastroesophageal reflux disease) 2013  . Hyperlipidemia 1986  . Hypertension 2008  . Hypothyroidism 1988   Diagnosed in her 43s.  Managed by Endocrinologist  . Osteoporosis 2015   pt states "osteopenia", but then says that she refused to take the rx med for this condition, so I suspect she had osteoporosis.  . Peripheral neuropathy 2017   Patient states diabetic neuropathy in feet prior to starting  chemotherapy and then worsened by chemo.   [name] Kitchen TIA (transient ischemic attack) 03/26/2011    Past Surgical History:  Procedure Laterality Date  . ABDOMINAL HYSTERECTOMY  1972  . APPENDECTOMY  1972  . BREAST ENHANCEMENT SURGERY  1982  . BREAST IMPLANT REMOVAL Right 09/13/2016   Procedure: REMOVAL RIGHT BREAST IMPLANT;  Surgeon: Irene Limbo, MD;  Location: [place];  Service: Plastics;  Laterality: Right;  . BREAST LUMPECTOMY WITH RADIOACTIVE SEED AND SENTINEL LYMPH NODE BIOPSY Right 09/13/2016   Procedure: RIGHT BREAST LUMPECTOMY WITH RADIOACTIVE SEED X 2 AND SENTINEL LYMPH NODE BIOPSY;  Surgeon: Alphonsa Overall, MD;  Location: [place];  Service: General;  Laterality: Right;  . BREAST SURGERY Right 03/14/2016   Biopsy  . CAPSULECTOMY Right 09/13/2016   Procedure: RIGHT CAPSULECTOMY;  Surgeon: Irene Limbo, MD;  Location: [place];  Service: Plastics;  Laterality: Right;  . CATARACT EXTRACTION, BILATERAL Bilateral 08/10/17 right eye, 08/31/17 left eye  . MASS EXCISION Left 02/28/2018   Path: benign.  Procedure: EXCISIONLEFT MEDIAL THIGH MASS ERAS PATHWAY;  Surgeon: Erroll [place], MD;  Location: [place];  Service: General;  Laterality: Left;  . PORTACATH PLACEMENT N/A 03/15/2016   Procedure: INSERTION PORT-A-CATH WITH [place];  Surgeon: Alphonsa Overall, MD;  [facility];  Service: General;  Laterality: N/A;  . PORTACATH REMOVAL  07/2017  . surgical repair left ankle Left 2009  s/p Fall   . TONSILLECTOMY AND ADENOIDECTOMY  1948   Age 78    There were no vitals filed for this visit.  Subjective Assessment - 08/16/18 1555    Subjective  Not really hurting right now.  I have pain in the AM.      Currently in Pain?  No/denies          [facility] Adult PT Treatment/Exercise - 08/17/18 0001      Self-Care   Other Self-Care Comments   possible sources of pain , OA vs tendinopathy vs lumbar       Lumbar Exercises: Stretches    Piriformis Stretch  3 reps;30 seconds      Lumbar Exercises: Aerobic   Nustep  6 min L5 UE and LE       Knee/Hip Exercises: Supine   Bridges  Strengthening;Both;1 set;20 reps    Bridges Limitations  green band     Bridges with Clamshell  Strengthening;Both;2 sets;10 reps      Knee/Hip Exercises: Sidelying   Hip ABduction  Strengthening;Both;1 set;20 reps    Clams  x 20 green       Moist Heat Therapy   Number Minutes Moist Heat  10 Minutes    Moist Heat Location  Hip      Manual Therapy   Joint Mobilization  post capsule stretch R hip , scouring     Soft tissue mobilization  Rt QL, Rt TFL, gluteals, caudal distraction to Rt LE     Manual Traction  Rt LE LAD x 5, 2 min oscillations               PT Short Term Goals - 08/14/18 1548      PT SHORT TERM GOAL #1   Title  Patient will increase right hip strength to 4/5     Status  On-going      PT SHORT TERM GOAL #2   Title  Patient will demonstrate full pain free right hip ROM    Baseline  improving with AROM and PROM , min pain with FABER and FADIR (anterior)     Status  On-going      PT SHORT TERM GOAL #3   Title  Patient will be independent with basic stretching and strengthening program     Baseline  basic , done in bed     Status  Achieved        PT Long Term Goals - 08/15/18 0555      PT LONG TERM GOAL #1   Title  Patient will sleep through the night without increased pain     Status  Unable to assess      PT LONG TERM GOAL #2   Title  Patient will ambualte 3000' without increased pain     Status  Unable to assess      PT LONG TERM GOAL #3   Title  Patieatient witll demonstrate a 40% limitation on FOTO     Status  Unable to assess      PT LONG TERM GOAL #4   Title  Pt will become more aware of posture, lifting as she completes ADLs, work tasks (sitting less) and walks    Status  Partially Met            Plan - 08/16/18 1636    Clinical Impression Statement  Patient seemed better today,  she has looked into yoga classes near her.  Her HEP was all based in stretching, added  in variations for hip strengthening.  Hip not as painful today with manual work, will  ensure she is on track to meet goals specifically next visit. She did have some pain in Rt quadratus lumborum as well as TFL, lateral hip in sidelying.     PT Next Visit Plan  push cardio to begin.  add in more standing if able to strengthen, load.  Cont manual/LAD  and DN if needed.     PT Home Exercise Plan  Hamstring, ITB, knee to opp shoulder (piriformis), bridge with band, clam, sidelying clam and QL stretch if needed.        Patient will benefit from skilled therapeutic intervention in order to improve the following deficits and impairments:  Decreased balance, Decreased mobility, Impaired sensation, Improper body mechanics, Pain, Postural dysfunction, Impaired flexibility, Impaired UE functional use, Increased fascial restricitons, Decreased strength, Decreased range of motion, Decreased activity tolerance, Decreased endurance  Visit Diagnosis: Pain in right hip  Other abnormalities of gait and mobility  Muscle weakness (generalized)  Abnormal posture  Sacroiliac joint pain     Problem List Patient Active Problem List   Diagnosis Date Noted  . Other long term (current) drug therapy 06/14/2017  . Anxiety and depression   . History of breast cancer 03/02/2017  . History of therapeutic radiation 03/02/2017  . Breast pain, right 10/03/2016  . Type 2 diabetes mellitus ([place]) 08/23/2016  . Hypertension associated with diabetes ([place]) 08/23/2016  . Hypothyroidism 08/23/2016  . Port catheter in place 05/09/2016  . Peripheral neuropathy 05/09/2016  . Constipation 04/19/2016  . Breast cancer of upper-inner quadrant of right female breast ([place]) 03/09/2016  . Major depressive disorder, recurrent ([place]) 02/27/2014  . Uncomplicated alcohol dependence ([place]) 02/19/2014    PAA,JENNIFER 08/17/2018, 6:08 AM  [facility] [address] [place], [place], [address] Phone: [phone]   Fax:  [phone]  Name: Marissa Hall MRN: [id number] Date of Birth: [birth year]  Raeford Razor, PT 08/17/18 6:08 AM Phone: [phone] Fax: [phone]

## 2018-08-21 ENCOUNTER — Ambulatory Visit: Payer: Medicare Other | Admitting: Physical Therapy

## 2018-08-23 ENCOUNTER — Ambulatory Visit: Payer: Medicare Other | Admitting: Physical Therapy

## 2018-08-27 ENCOUNTER — Ambulatory Visit: Payer: Medicare Other | Admitting: Physical Therapy

## 2018-08-29 ENCOUNTER — Ambulatory Visit: Payer: Medicare Other | Admitting: Physical Therapy

## 2018-09-21 ENCOUNTER — Ambulatory Visit: Payer: Medicare Other | Admitting: Physical Therapy

## 2018-09-24 ENCOUNTER — Encounter: Payer: Self-pay | Admitting: Family Medicine

## 2018-09-25 ENCOUNTER — Ambulatory Visit: Payer: Medicare Other | Attending: Family Medicine | Admitting: Physical Therapy

## 2018-09-25 ENCOUNTER — Other Ambulatory Visit: Payer: Self-pay

## 2018-09-25 DIAGNOSIS — R2689 Other abnormalities of gait and mobility: Secondary | ICD-10-CM

## 2018-09-25 DIAGNOSIS — M6281 Muscle weakness (generalized): Secondary | ICD-10-CM | POA: Diagnosis not present

## 2018-09-25 DIAGNOSIS — R293 Abnormal posture: Secondary | ICD-10-CM | POA: Diagnosis not present

## 2018-09-25 DIAGNOSIS — M533 Sacrococcygeal disorders, not elsewhere classified: Secondary | ICD-10-CM | POA: Diagnosis not present

## 2018-09-25 DIAGNOSIS — M25551 Pain in right hip: Secondary | ICD-10-CM | POA: Insufficient documentation

## 2018-09-26 ENCOUNTER — Encounter: Payer: Self-pay | Admitting: Physical Therapy

## 2018-09-26 NOTE — Therapy (Signed)
[place], [place], [address] Phone: [phone]   Fax:  [phone]  Physical Therapy Treatment/ Re-eval     Patient Details  Name: Marissa Hall MRN: [id number] Date of Birth: 1940/11/18 Referring Provider (PT): Dr Ricardo Jericho    Encounter Date: 09/25/2018   Progress Note Reporting Period 07/30/2018 to 09/25/2018  See note below for Objective Data and Assessment of Progress/Goals.      .     PT End of Session - 09/26/18 1134    Visit Number  6    Number of Visits  18    Date for PT Re-Evaluation  11/07/18    Authorization Type  Medicare BCBS   Progrss note at visit 16 Kx at 10     PT Start Time  1530    PT Stop Time  1611    PT Time Calculation (min)  41 min    Activity Tolerance  Patient tolerated treatment well    Behavior During Therapy  [facility] for tasks assessed/performed       Past Medical History:  Diagnosis Date  . Anxiety and depression 1964   oncologist started duloxetine 12/2016  . Breast cancer ([place]) 03/14/2016   Clinical stage 2A: (triple neg): Right breast, upper inner quadrant, 03/2016.  Neoadjuvant chemo x 5 cycles,lumpectomy 4 mo later, then RT started 10/2016.  Adjuvant Xeloda [id number].  SWOG research trial pt 04/2017--pt randomized to pembrolizumab immunotherapy.  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  [name] Kitchen Cataracts, bilateral 07/2017  . 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 Depression 1964   Patient states since age 70  . Diabetes mellitus with complication ([place]) 3428   managed by endocrinology.  A1c Mar 12, 2018 was 7.0% at Dr. Shirlyn Goltz.   [name] Kitchen GERD (gastroesophageal reflux disease) 2013  . Hyperlipidemia 1986  . Hypertension 2008  . Hypothyroidism 1988   Diagnosed in her 64s.  Managed by Endocrinologist  . Osteoporosis 2015   pt  states "osteopenia", but then says that she refused to take the rx med for this condition, so I suspect she had osteoporosis.  . Peripheral neuropathy 2017   Patient states diabetic neuropathy in feet prior to starting chemotherapy and then worsened by chemo.   [name] Kitchen TIA (transient ischemic attack) 03/26/2011    Past Surgical History:  Procedure Laterality Date  . ABDOMINAL HYSTERECTOMY  1972  . APPENDECTOMY  1972  . BREAST ENHANCEMENT SURGERY  1982  . BREAST IMPLANT REMOVAL Right 09/13/2016   Procedure: REMOVAL RIGHT BREAST IMPLANT;  Surgeon: Irene Limbo, MD;  Location: [facility];  Service: Plastics;  Laterality: Right;  . BREAST LUMPECTOMY WITH RADIOACTIVE SEED AND SENTINEL LYMPH NODE BIOPSY Right 09/13/2016   Procedure: RIGHT BREAST LUMPECTOMY WITH RADIOACTIVE SEED X 2 AND SENTINEL LYMPH NODE BIOPSY;  Surgeon: Alphonsa Overall, MD;  Location: [facility];  Service: General;  Laterality: Right;  . BREAST SURGERY Right 03/14/2016   Biopsy  . CAPSULECTOMY Right 09/13/2016   Procedure: RIGHT CAPSULECTOMY;  Surgeon: Irene Limbo, MD;  Location: [facility];  Service: Plastics;  Laterality: Right;  . CATARACT EXTRACTION, BILATERAL Bilateral 08/10/17 right eye, 08/31/17 left eye  . MASS EXCISION Left 02/28/2018   Path: benign.  Procedure: EXCISIONLEFT MEDIAL THIGH MASS ERAS PATHWAY;  Surgeon: Erroll Luna, MD;  Location: [facility];  Service: General;  Laterality: Left;  . PORTACATH PLACEMENT N/A 03/15/2016   Procedure: INSERTION PORT-A-CATH WITH [place];  Surgeon: Alphonsa Overall, MD;  [facility];  Service: General;  Laterality: N/A;  . PORTACATH REMOVAL  07/2017  . surgical repair left ankle Left 2009   s/p Fall   . TONSILLECTOMY AND ADENOIDECTOMY  1948   Age 78    There were no vitals filed for this visit.  Subjective Assessment - 09/26/18 1125    Subjective  Patient reports his hip has been very sore. She fell a few weeks ago. She  hit her knees mostly. She has increased soreness when she lies on that side. She also has increased pain when she walks.                                               Pertinent History  having lipoma removed tomorrow,  LLE ankle surgery, diabetes , breast cancer, osteopenia, diabetes and neuropathy     Limitations  Walking;Standing;Lifting;Sitting;Other (comment);House hold activities    How long can you sit comfortably?  30 min     How long can you stand comfortably?  cooked for about 3-4 hours and felt OK, but incr pain the next day     How long can you walk comfortably?  30 min (start and stop)    Diagnostic tests  none     Patient Stated Goals  Patient wants to be able to feel a little more comfortable.     Currently in Pain?  Yes    Pain Score  6     Pain Location  Hip    Pain Orientation  Right    Pain Descriptors / Indicators  Aching    Pain Type  Chronic pain    Pain Onset  More than a month ago    Pain Frequency  Intermittent    Aggravating Factors   sitting    Pain Relieving Factors  stretching     Effect of Pain on Daily Activities  limits mobility                        [facility] Adult PT Treatment/Exercise - 09/26/18 0001      Lumbar Exercises: Stretches   Lower Trunk Rotation Limitations  x10    Piriformis Stretch  3 reps;30 seconds      Lumbar Exercises: Supine   Bridge Limitations  2x10    Other Supine Lumbar Exercises  clamshell red 2x10       Manual Therapy   Joint Mobilization  post capsule stretch R hip ,      Soft tissue mobilization  Rt QL, Rt TFL, gluteals, roller to gluteals     Manual Traction  Rt LE LAD x 5, 2 min oscillations             PT Education - 09/26/18 1134    Education Details  reviewed HEp and symptom mangement     Person(s) Educated  Patient    Methods  Explanation;Demonstration;Tactile cues;Verbal cues    Comprehension  Verbalized understanding;Returned demonstration;Verbal cues required;Tactile cues required        PT Short Term Goals - 09/26/18 1422      PT SHORT TERM GOAL #1   Title  Patient will increase right hip strength to 4/5     Baseline  has not yet met goal     Time  3    Period  Weeks    Status  On-going    Target Date  10/17/18      PT SHORT TERM GOAL #2   Title  Patient will demonstrate full pain free right hip ROM    Baseline  continues to have pain with end ranges     Time  3    Period  Weeks    Status  New    Target Date  10/17/18      PT SHORT TERM GOAL #3   Title  Patient will be independent with basic stretching and strengthening program     Baseline  basic , done in bed     Time  3    Period  Weeks    Status  Achieved    Target Date  10/17/18        PT Long Term Goals - 09/26/18 1423      PT LONG TERM GOAL #1   Title  Patient will sleep through the night without increased pain     Baseline  continues to have significant pain at night     Time  6    Period  Weeks    Status  On-going    Target Date  11/07/18      PT LONG TERM GOAL #2   Title  Patient will ambualte 3000' without increased pain     Baseline  continues to have difficulty ambulating     Time  6    Period  Weeks    Status  On-going      PT LONG TERM GOAL #3   Title  Patieatient witll demonstrate a 40% limitation on FOTO     Baseline  50% better     Time  6    Period  Weeks    Status  On-going      PT LONG TERM GOAL #4   Title  Pt will become more aware of posture, lifting as she completes ADLs, work tasks (sitting less) and walks    Time  6    Period  Weeks    Status  On-going      PT LONG TERM GOAL #5   Title  Pt will be able to sleep with no more than min disturbance of pain from low back/hip.     Baseline  waker her if she lies on her side     Time  6    Period  Weeks    Status  On-going            Plan - 09/26/18 1418    Clinical Impression Statement  The patient has not been seen for a few weeks. In that time she has fallen and is now feeling increased pain in her  hip and into her lower back. She continues to have significant weeakness in her hip and decreased passive range of motion compared to the left side. She admits to not being as consitent with her exercises as she should. She continues to do some light stretching. She would benefit from further skilled therapy to improve her strength and decrease her trigger points. She has trigger points in her gluteal and lower back. She also has tenderness  into the IT band.     Stability/Clinical Decision Making  Evolving/Moderate complexity    Clinical Decision Making  Moderate    Rehab Potential  Good    PT  Frequency  2x / week    PT Duration  6 weeks    PT Treatment/Interventions  ADLs/Self Care Home Management;Cryotherapy;Ultrasound;Moist Heat;Balance training;Dry needling;Therapeutic exercise;Patient/family education;Manual techniques;Passive range of motion;Therapeutic activities;Iontophoresis 71m/ml Dexamethasone;Electrical Stimulation;Neuromuscular re-education;Taping    PT Next Visit Plan  push cardio to begin.  add in more standing if able to strengthen, load.  Cont manual/LAD  and DN if needed.     PT Home Exercise Plan  Hamstring, ITB, knee to opp shoulder (piriformis), bridge with band, clam, sidelying clam and QL stretch if needed.     Consulted and Agree with Plan of Care  Patient       Patient will benefit from skilled therapeutic intervention in order to improve the following deficits and impairments:  Decreased balance, Decreased mobility, Impaired sensation, Improper body mechanics, Pain, Postural dysfunction, Impaired flexibility, Impaired UE functional use, Increased fascial restricitons, Decreased strength, Decreased range of motion, Decreased activity tolerance, Decreased endurance  Visit Diagnosis: Pain in right hip  Other abnormalities of gait and mobility  Muscle weakness (generalized)  Abnormal posture  Sacroiliac joint pain     Problem List Patient Active Problem List    Diagnosis Date Noted  . Other long term (current) drug therapy 06/14/2017  . Anxiety and depression   . History of breast cancer 03/02/2017  . History of therapeutic radiation 03/02/2017  . Breast pain, right 10/03/2016  . Type 2 diabetes mellitus ([place] 08/23/2016  . Hypertension associated with diabetes ([place] 08/23/2016  . Hypothyroidism 08/23/2016  . Port catheter in place 05/09/2016  . Peripheral neuropathy 05/09/2016  . Constipation 04/19/2016  . Breast cancer of upper-inner quadrant of right female breast ([place] 03/09/2016  . Major depressive disorder, recurrent ([place] 02/27/2014  . Uncomplicated alcohol dependence ([place] 02/19/2014    DCarney Living4/22/2020, 2:27 PM  [facility] [address]Phone: [phone]  Fax:  [phone] Name: BLonita DebesMRN: [id number] of Birth: 106-24-1942

## 2018-10-02 ENCOUNTER — Ambulatory Visit: Payer: Medicare Other | Admitting: Physical Therapy

## 2018-10-02 ENCOUNTER — Encounter: Payer: Self-pay | Admitting: Physical Therapy

## 2018-10-02 ENCOUNTER — Other Ambulatory Visit: Payer: Self-pay

## 2018-10-02 DIAGNOSIS — M533 Sacrococcygeal disorders, not elsewhere classified: Secondary | ICD-10-CM | POA: Diagnosis not present

## 2018-10-02 DIAGNOSIS — R293 Abnormal posture: Secondary | ICD-10-CM | POA: Diagnosis not present

## 2018-10-02 DIAGNOSIS — M25551 Pain in right hip: Secondary | ICD-10-CM

## 2018-10-02 DIAGNOSIS — R2689 Other abnormalities of gait and mobility: Secondary | ICD-10-CM | POA: Diagnosis not present

## 2018-10-02 DIAGNOSIS — M6281 Muscle weakness (generalized): Secondary | ICD-10-CM | POA: Diagnosis not present

## 2018-10-03 ENCOUNTER — Encounter: Payer: Self-pay | Admitting: Physical Therapy

## 2018-10-03 NOTE — Therapy (Signed)
[place], [place], [address] Phone: [phone]   Fax:  [phone]  Physical Therapy Treatment  Patient Details  Name: Marissa Hall MRN: [id number] Date of Birth: Nov 11, 1940 Referring Provider (PT): Dr Ricardo Jericho    Encounter Date: 10/02/2018  PT End of Session - 10/02/18 1542    Visit Number  7    Number of Visits  18    Date for PT Re-Evaluation  11/07/18    Authorization Type  Medicare BCBS   Progrss note at visit 16 Kx at 10     PT Start Time  1530    PT Stop Time  1620    PT Time Calculation (min)  50 min    Activity Tolerance  Patient tolerated treatment well    Behavior During Therapy  [facility] for tasks assessed/performed       Past Medical History:  Diagnosis Date  . Anxiety and depression 1964   oncologist started duloxetine 12/2016  . Breast cancer ([place]) 03/14/2016   Clinical stage 2A: (triple neg): Right breast, upper inner quadrant, 03/2016.  Neoadjuvant chemo x 5 cycles,lumpectomy 4 mo later, then RT started 10/2016.  Adjuvant Xeloda [id number].  SWOG research trial pt 04/2017--pt randomized to pembrolizumab immunotherapy.  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  [name] Kitchen Cataracts, bilateral 07/2017  . 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 Depression 1964   Patient states since age 34  . Diabetes mellitus with complication ([place]) 3435   managed by endocrinology.  A1c Mar 12, 2018 was 7.0% at Dr. Shirlyn Goltz.   [name] Kitchen GERD (gastroesophageal reflux disease) 2013  . Hyperlipidemia 1986  . Hypertension 2008  . Hypothyroidism 1988   Diagnosed in her 61s.  Managed by Endocrinologist  . Osteoporosis 2015   pt states "osteopenia", but then says that she refused to take the rx med for this condition, so I suspect she had osteoporosis.  . Peripheral neuropathy 2017   Patient states diabetic neuropathy in feet prior to starting chemotherapy and then worsened by chemo.   [name] Kitchen TIA (transient ischemic attack) 03/26/2011    Past Surgical History:  Procedure Laterality Date  . ABDOMINAL HYSTERECTOMY  1972  . APPENDECTOMY  1972  . BREAST ENHANCEMENT SURGERY  1982  . BREAST IMPLANT REMOVAL Right 09/13/2016   Procedure: REMOVAL RIGHT BREAST IMPLANT;  Surgeon: Irene Limbo, MD;  Location: [facility];  Service: Plastics;  Laterality: Right;  . BREAST LUMPECTOMY WITH RADIOACTIVE SEED AND SENTINEL LYMPH NODE BIOPSY Right 09/13/2016   Procedure: RIGHT BREAST LUMPECTOMY WITH RADIOACTIVE SEED X 2 AND SENTINEL LYMPH NODE BIOPSY;  Surgeon: Alphonsa Overall, MD;  Location: [facility];  Service: General;  Laterality: Right;  . BREAST SURGERY Right 03/14/2016   Biopsy  . CAPSULECTOMY Right 09/13/2016   Procedure: RIGHT CAPSULECTOMY;  Surgeon: Irene Limbo, MD;  Location: [facility];  Service: Plastics;  Laterality: Right;  . CATARACT EXTRACTION, BILATERAL Bilateral 08/10/17 right eye, 08/31/17 left eye  . MASS EXCISION Left 02/28/2018   Path: benign.  Procedure: EXCISIONLEFT MEDIAL THIGH MASS ERAS PATHWAY;  Surgeon: Erroll Luna, MD;  Location: [facility];  Service: General;  Laterality: Left;  . PORTACATH PLACEMENT N/A 03/15/2016   Procedure: INSERTION PORT-A-CATH WITH [place];  Surgeon: Alphonsa Overall, MD;  [facility];  Service: General;  Laterality: N/A;  .  PORTACATH REMOVAL  07/2017  . surgical repair left ankle Left 2009   s/p Fall   . TONSILLECTOMY AND ADENOIDECTOMY  1948   Age 78    There were no vitals filed for this visit.  Subjective Assessment - 10/02/18 1540    Subjective  Patient reports his hip has been very sore. She fell a few weeks ago. She hit her knees mostly. She has increased soreness when she lies on that side. She also has increased pain when she walks.                                                Pertinent History  having lipoma removed tomorrow,  LLE ankle surgery, diabetes , breast cancer, osteopenia, diabetes and neuropathy     Limitations  Walking;Standing;Lifting;Sitting;Other (comment);House hold activities    How long can you sit comfortably?  30 min     How long can you stand comfortably?  cooked for about 3-4 hours and felt OK, but incr pain the next day     Currently in Pain?  Yes    Pain Score  3     Pain Location  Hip    Pain Orientation  Right    Pain Descriptors / Indicators  Aching    Pain Type  Chronic pain    Pain Onset  More than a month ago    Pain Frequency  Intermittent    Aggravating Factors   sitting     Pain Relieving Factors  Stretching     Effect of Pain on Daily Activities  limits mobility                        [facility] Adult PT Treatment/Exercise - 10/03/18 0001      Lumbar Exercises: Stretches   Lower Trunk Rotation Limitations  x10    Piriformis Stretch  3 reps;30 seconds      Lumbar Exercises: Aerobic   Nustep  6 min L5 UE and LE       Lumbar Exercises: Standing   Other Standing Lumbar Exercises  marching x20; Hip abduction x20 bilateral; hoip extension x20 bilateral       Lumbar Exercises: Supine   Bridge Limitations  2x10    Other Supine Lumbar Exercises  clamshell red 2x10       Moist Heat Therapy   Number Minutes Moist Heat  10 Minutes    Moist Heat Location  Hip      Manual Therapy   Joint Mobilization  post capsule stretch R hip ,      Soft tissue mobilization  trigger point release to gluteals and IT band     Manual Traction  Rt LE LAD x 5, 2 min oscillations             PT Education - 10/02/18 1541    Education Details  reviewed HEp and symptom mangement    Person(s) Educated  Patient    Methods  Explanation;Demonstration;Verbal cues    Comprehension  Verbalized understanding;Returned demonstration;Verbal cues required;Tactile cues required;Other (comment)       PT Short Term Goals -  09/26/18 1422      PT SHORT TERM GOAL #1   Title  Patient will increase right hip strength to 4/5     Baseline  has not yet met goal  Time  3    Period  Weeks    Status  On-going    Target Date  10/17/18      PT SHORT TERM GOAL #2   Title  Patient will demonstrate full pain free right hip ROM    Baseline  continues to have pain with end ranges     Time  3    Period  Weeks    Status  New    Target Date  10/17/18      PT SHORT TERM GOAL #3   Title  Patient will be independent with basic stretching and strengthening program     Baseline  basic , done in bed     Time  3    Period  Weeks    Status  Achieved    Target Date  10/17/18        PT Long Term Goals - 09/26/18 1423      PT LONG TERM GOAL #1   Title  Patient will sleep through the night without increased pain     Baseline  continues to have significant pain at night     Time  6    Period  Weeks    Status  On-going    Target Date  11/07/18      PT LONG TERM GOAL #2   Title  Patient will ambualte 3000' without increased pain     Baseline  continues to have difficulty ambulating     Time  6    Period  Weeks    Status  On-going      PT LONG TERM GOAL #3   Title  Patieatient witll demonstrate a 40% limitation on FOTO     Baseline  50% better     Time  6    Period  Weeks    Status  On-going      PT LONG TERM GOAL #4   Title  Pt will become more aware of posture, lifting as she completes ADLs, work tasks (sitting less) and walks    Time  6    Period  Weeks    Status  On-going      PT LONG TERM GOAL #5   Title  Pt will be able to sleep with no more than min disturbance of pain from low back/hip.     Baseline  waker her if she lies on her side     Time  6    Period  Weeks    Status  On-going            Plan - 10/03/18 1050    Clinical Impression Statement  Patient is doing better. Therapy continues to foucs on manual therapy but the patient perfromed more ther-ex today. She was advised to perfrom  standing activity at least 1x per day. She had less spasming but still has significant spasming. She declined trogger point dry needling.     Stability/Clinical Decision Making  Evolving/Moderate complexity    Clinical Decision Making  Moderate    Rehab Potential  Good    PT Frequency  2x / week    PT Duration  6 weeks    PT Treatment/Interventions  ADLs/Self Care Home Management;Cryotherapy;Ultrasound;Moist Heat;Balance training;Dry needling;Therapeutic exercise;Patient/family education;Manual techniques;Passive range of motion;Therapeutic activities;Iontophoresis 38m/ml Dexamethasone;Electrical Stimulation;Neuromuscular re-education;Taping    PT Next Visit Plan  push cardio to begin.  add in more standing if able to strengthen, load.  Cont manual/LAD  and DN if needed.  PT Home Exercise Plan  Hamstring, ITB, knee to opp shoulder (piriformis), bridge with band, clam, sidelying clam and QL stretch if needed.     Consulted and Agree with Plan of Care  Patient       Patient will benefit from skilled therapeutic intervention in order to improve the following deficits and impairments:  Decreased balance, Decreased mobility, Impaired sensation, Improper body mechanics, Pain, Postural dysfunction, Impaired flexibility, Impaired UE functional use, Increased fascial restricitons, Decreased strength, Decreased range of motion, Decreased activity tolerance, Decreased endurance  Visit Diagnosis: Pain in right hip  Other abnormalities of gait and mobility  Muscle weakness (generalized)  Abnormal posture  Sacroiliac joint pain     Problem List Patient Active Problem List   Diagnosis Date Noted  . Other long term (current) drug therapy 06/14/2017  . Anxiety and depression   . History of breast cancer 03/02/2017  . History of therapeutic radiation 03/02/2017  . Breast pain, right 10/03/2016  . Type 2 diabetes mellitus ([place]) 08/23/2016  . Hypertension associated with diabetes ([place])  08/23/2016  . Hypothyroidism 08/23/2016  . Port catheter in place 05/09/2016  . Peripheral neuropathy 05/09/2016  . Constipation 04/19/2016  . Breast cancer of upper-inner quadrant of right female breast ([place]) 03/09/2016  . Major depressive disorder, recurrent ([place]) 02/27/2014  . Uncomplicated alcohol dependence ([place]) 02/19/2014    Carney Living PT DPT  10/03/2018, 10:56 AM  [facility] [address] [place], [place], [address] Phone: [phone]   Fax:  [phone]  Name: Marissa Hall MRN: [id number] Date of Birth: August 19, 1940

## 2018-10-09 ENCOUNTER — Other Ambulatory Visit: Payer: Self-pay

## 2018-10-09 ENCOUNTER — Encounter: Payer: Self-pay | Admitting: Physical Therapy

## 2018-10-09 ENCOUNTER — Ambulatory Visit: Payer: Medicare Other | Attending: Family Medicine | Admitting: Physical Therapy

## 2018-10-09 DIAGNOSIS — M533 Sacrococcygeal disorders, not elsewhere classified: Secondary | ICD-10-CM

## 2018-10-09 DIAGNOSIS — R2689 Other abnormalities of gait and mobility: Secondary | ICD-10-CM | POA: Diagnosis not present

## 2018-10-09 DIAGNOSIS — M25551 Pain in right hip: Secondary | ICD-10-CM | POA: Diagnosis not present

## 2018-10-09 DIAGNOSIS — M6281 Muscle weakness (generalized): Secondary | ICD-10-CM

## 2018-10-09 DIAGNOSIS — R293 Abnormal posture: Secondary | ICD-10-CM | POA: Insufficient documentation

## 2018-10-10 ENCOUNTER — Encounter: Payer: Self-pay | Admitting: Physical Therapy

## 2018-10-10 NOTE — Therapy (Signed)
[place], [place], [address] Phone: [phone]   Fax:  [phone]  Physical Therapy Treatment  Patient Details  Name: Marissa Hall MRN: [id number] Date of Birth: Mar 09, 1941 Referring Provider (PT): Dr Ricardo Jericho    Encounter Date: 10/09/2018  PT End of Session - 10/09/18 1609    Visit Number  8    Number of Visits  18    Date for PT Re-Evaluation  11/07/18    Authorization Type  Medicare BCBS   Progrss note at visit 16 Kx at 10     PT Start Time  1545   Patient out in the parking lot    PT Stop Time  1634    PT Time Calculation (min)  49 min    Activity Tolerance  Patient tolerated treatment well    Behavior During Therapy  [facility] for tasks assessed/performed       Past Medical History:  Diagnosis Date  . Anxiety and depression 1964   oncologist started duloxetine 12/2016  . Breast cancer ([place]) 03/14/2016   Clinical stage 2A: (triple neg): Right breast, upper inner quadrant, 03/2016.  Neoadjuvant chemo x 5 cycles,lumpectomy 4 mo later, then RT started 10/2016.  Adjuvant Xeloda [id number].  SWOG research trial pt 04/2017--pt randomized to pembrolizumab immunotherapy.  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  [name] Kitchen Cataracts, bilateral 07/2017  . 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 Depression 1964   Patient states since age 7  . Diabetes mellitus with complication ([place]) 9323   managed by endocrinology.  A1c Mar 12, 2018 was 7.0% at Dr. Shirlyn Goltz.   [name] Kitchen GERD (gastroesophageal reflux disease) 2013  . Hyperlipidemia 1986  . Hypertension 2008  . Hypothyroidism 1988   Diagnosed in her 98s.  Managed by Endocrinologist  . Osteoporosis 2015   pt states "osteopenia", but then says that she refused to take the rx med for this condition, so I suspect she had osteoporosis.   . Peripheral neuropathy 2017   Patient states diabetic neuropathy in feet prior to starting chemotherapy and then worsened by chemo.   [name] Kitchen TIA (transient ischemic attack) 03/26/2011    Past Surgical History:  Procedure Laterality Date  . ABDOMINAL HYSTERECTOMY  1972  . APPENDECTOMY  1972  . BREAST ENHANCEMENT SURGERY  1982  . BREAST IMPLANT REMOVAL Right 09/13/2016   Procedure: REMOVAL RIGHT BREAST IMPLANT;  Surgeon: Irene Limbo, MD;  Location: [facility];  Service: Plastics;  Laterality: Right;  . BREAST LUMPECTOMY WITH RADIOACTIVE SEED AND SENTINEL LYMPH NODE BIOPSY Right 09/13/2016   Procedure: RIGHT BREAST LUMPECTOMY WITH RADIOACTIVE SEED X 2 AND SENTINEL LYMPH NODE BIOPSY;  Surgeon: Alphonsa Overall, MD;  Location: [facility];  Service: General;  Laterality: Right;  . BREAST SURGERY Right 03/14/2016   Biopsy  . CAPSULECTOMY Right 09/13/2016   Procedure: RIGHT CAPSULECTOMY;  Surgeon: Irene Limbo, MD;  Location: [facility];  Service: Plastics;  Laterality: Right;  . CATARACT EXTRACTION, BILATERAL Bilateral 08/10/17 right eye, 08/31/17 left eye  . MASS EXCISION Left 02/28/2018   Path: benign.  Procedure: EXCISIONLEFT MEDIAL THIGH MASS ERAS PATHWAY;  Surgeon: Erroll Luna, MD;  Location: [facility];  Service: General;  Laterality: Left;  . PORTACATH PLACEMENT N/A 03/15/2016   Procedure: INSERTION PORT-A-CATH WITH [place];  Surgeon: Alphonsa Overall, MD;  Location:  [facility];  Service: General;  Laterality: N/A;  . PORTACATH REMOVAL  07/2017  . surgical repair left ankle Left 2009   s/p Fall   . TONSILLECTOMY AND ADENOIDECTOMY  1948   Age 78    There were no vitals filed for this visit.  Subjective Assessment - 10/09/18 1552    Subjective  Patient reports her hip has been doing pretty well. She has not had much pain today.     Pertinent History  having lipoma removed tomorrow,  LLE ankle surgery, diabetes , breast cancer, osteopenia,  diabetes and neuropathy     Limitations  Walking;Standing;Lifting;Sitting;Other (comment);House hold activities    How long can you sit comfortably?  30 min     How long can you stand comfortably?  cooked for about 3-4 hours and felt OK, but incr pain the next day     How long can you walk comfortably?  30 min (start and stop)    Diagnostic tests  none     Patient Stated Goals  Patient wants to be able to feel a little more comfortable.     Currently in Pain?  No/denies                       [facility] Adult PT Treatment/Exercise - 10/10/18 0001      Lumbar Exercises: Standing   Functional Squats Limitations  2x10      Lumbar Exercises: Supine   Bent Knee Raise Limitations  2x10    Bridge Limitations  2x10    Other Supine Lumbar Exercises  clamshell Green  2x10       Knee/Hip Exercises: Standing   Hip Flexion Limitations  standing march 2x10 1lb on right     Abduction Limitations  1lb on right 2x10 each     Extension Limitations  1x10 right 2x10 each     Forward Step Up  2 sets;10 reps;Step Height: 4"      Moist Heat Therapy   Number Minutes Moist Heat  10 Minutes      Manual Therapy   Joint Mobilization  post capsule stretch R hip ,      Soft tissue mobilization  trigger point release to gluteals and IT band     Manual Traction  Rt LE LAD x 5, 2 min oscillations             PT Education - 10/09/18 1553    Education Details  reviewed HEP and symptom mangement     Person(s) Educated  Patient    Methods  Explanation;Demonstration;Tactile cues;Verbal cues    Comprehension  Verbalized understanding;Returned demonstration;Verbal cues required;Tactile cues required       PT Short Term Goals - 10/10/18 1245      PT SHORT TERM GOAL #1   Title  Patient will increase right hip strength to 4/5     Baseline  has not yet met goal     Time  3    Period  Weeks    Status  On-going      PT SHORT TERM GOAL #2   Title  Patient will demonstrate full pain free right  hip ROM    Baseline  continues to have pain with end ranges     Time  3    Period  Weeks    Status  On-going    Target Date  10/17/18      PT SHORT TERM GOAL #3   Title  Patient will  be independent with basic stretching and strengthening program     Baseline  basic , done in bed     Time  3    Period  Weeks    Status  On-going        PT Long Term Goals - 09/26/18 1423      PT LONG TERM GOAL #1   Title  Patient will sleep through the night without increased pain     Baseline  continues to have significant pain at night     Time  6    Period  Weeks    Status  On-going    Target Date  11/07/18      PT LONG TERM GOAL #2   Title  Patient will ambualte 3000' without increased pain     Baseline  continues to have difficulty ambulating     Time  6    Period  Weeks    Status  On-going      PT LONG TERM GOAL #3   Title  Patieatient witll demonstrate a 40% limitation on FOTO     Baseline  50% better     Time  6    Period  Weeks    Status  On-going      PT LONG TERM GOAL #4   Title  Pt will become more aware of posture, lifting as she completes ADLs, work tasks (sitting less) and walks    Time  6    Period  Weeks    Status  On-going      PT LONG TERM GOAL #5   Title  Pt will be able to sleep with no more than min disturbance of pain from low back/hip.     Baseline  waker her if she lies on her side     Time  6    Period  Weeks    Status  On-going            Plan - 10/09/18 1614    Clinical Impression Statement  Therapy focused more on strengthening today. She was advised to work on stretching if the exercises made her sore. She had no increase in pain with ther-ex. Therapy will continue to progress ther-ex as tolerated. If she is sore we will focus more on manual therapy and needling. She has a significant reducation in spasming in the gluteal.     Stability/Clinical Decision Making  Evolving/Moderate complexity    Clinical Decision Making  Moderate    Rehab  Potential  Good    PT Frequency  2x / week    PT Duration  6 weeks    PT Treatment/Interventions  ADLs/Self Care Home Management;Cryotherapy;Ultrasound;Moist Heat;Balance training;Dry needling;Therapeutic exercise;Patient/family education;Manual techniques;Passive range of motion;Therapeutic activities;Iontophoresis '4mg'$ /ml Dexamethasone;Electrical Stimulation;Neuromuscular re-education;Taping    PT Next Visit Plan  push cardio to begin.  add in more standing if able to strengthen, load.  Cont manual/LAD  and DN if needed.     PT Home Exercise Plan  Hamstring, ITB, knee to opp shoulder (piriformis), bridge with band, clam, sidelying clam and QL stretch if needed.     Consulted and Agree with Plan of Care  Patient       Patient will benefit from skilled therapeutic intervention in order to improve the following deficits and impairments:  Decreased balance, Decreased mobility, Impaired sensation, Improper body mechanics, Pain, Postural dysfunction, Impaired flexibility, Impaired UE functional use, Increased fascial restricitons, Decreased strength, Decreased range of motion, Decreased activity tolerance, Decreased endurance  Visit Diagnosis: Pain in right hip  Other abnormalities of gait and mobility  Muscle weakness (generalized)  Abnormal posture  Sacroiliac joint pain     Problem List Patient Active Problem List   Diagnosis Date Noted  . Other long term (current) drug therapy 06/14/2017  . Anxiety and depression   . History of breast cancer 03/02/2017  . History of therapeutic radiation 03/02/2017  . Breast pain, right 10/03/2016  . Type 2 diabetes mellitus ([place]) 08/23/2016  . Hypertension associated with diabetes ([place]) 08/23/2016  . Hypothyroidism 08/23/2016  . Port catheter in place 05/09/2016  . Peripheral neuropathy 05/09/2016  . Constipation 04/19/2016  . 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 Major depressive disorder, recurrent  ([place]) 02/27/2014  . Uncomplicated alcohol dependence ([place]) 02/19/2014    Carney Living PT DPT  10/10/2018, 12:52 PM  [facility] [address] [place], [place], [address] Phone: [phone]   Fax:  [phone]  Name: Marissa Hall MRN: [id number] Date of Birth: [birth year]

## 2018-10-16 ENCOUNTER — Other Ambulatory Visit: Payer: Self-pay

## 2018-10-16 ENCOUNTER — Ambulatory Visit: Payer: Medicare Other | Admitting: Physical Therapy

## 2018-10-16 DIAGNOSIS — R293 Abnormal posture: Secondary | ICD-10-CM | POA: Diagnosis not present

## 2018-10-16 DIAGNOSIS — M533 Sacrococcygeal disorders, not elsewhere classified: Secondary | ICD-10-CM | POA: Diagnosis not present

## 2018-10-16 DIAGNOSIS — M25551 Pain in right hip: Secondary | ICD-10-CM | POA: Diagnosis not present

## 2018-10-16 DIAGNOSIS — R2689 Other abnormalities of gait and mobility: Secondary | ICD-10-CM

## 2018-10-16 DIAGNOSIS — M6281 Muscle weakness (generalized): Secondary | ICD-10-CM

## 2018-10-16 NOTE — Therapy (Addendum)
[place], [place], [address] Phone: [phone]   Fax:  [phone]  Physical Therapy Treatment/Discharge   Patient Details  Name: Marissa Hall MRN: [id number] Date of Birth: 1940-12-16 Referring Provider (PT): Dr Ricardo Jericho    Encounter Date: 10/16/2018  PT End of Session - 10/16/18 1635    Visit Number  9    Number of Visits  18    Date for PT Re-Evaluation  11/07/18    Authorization Type  Medicare BCBS   Progrss note at visit 16 Kx at 10     PT Start Time  1535    PT Stop Time  1625   last 10 min on heat   PT Time Calculation (min)  50 min    Activity Tolerance  Patient tolerated treatment well    Behavior During Therapy  [facility] for tasks assessed/performed       Past Medical History:  Diagnosis Date  . Anxiety and depression 1964   oncologist started duloxetine 12/2016  . Breast cancer ([place]) 03/14/2016   Clinical stage 2A: (triple neg): Right breast, upper inner quadrant, 03/2016.  Neoadjuvant chemo x 5 cycles,lumpectomy 4 mo later, then RT started 10/2016.  Adjuvant Xeloda [id number].  SWOG research trial pt 04/2017--pt randomized to pembrolizumab immunotherapy.  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  [name] Kitchen Cataracts, bilateral 07/2017  . 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 Depression 1964   Patient states since age 66  . Diabetes mellitus with complication ([place]) 6568   managed by endocrinology.  A1c Mar 12, 2018 was 7.0% at Dr. Shirlyn Goltz.   [name] Kitchen GERD (gastroesophageal reflux disease) 2013  . Hyperlipidemia 1986  . Hypertension 2008  . Hypothyroidism 1988   Diagnosed in her 34s.  Managed by Endocrinologist  . Osteoporosis 2015   pt states "osteopenia", but then says that she refused to take the rx med for this condition, so I suspect she had osteoporosis.   . Peripheral neuropathy 2017   Patient states diabetic neuropathy in feet prior to starting chemotherapy and then worsened by chemo.   [name] Kitchen TIA (transient ischemic attack) 03/26/2011    Past Surgical History:  Procedure Laterality Date  . ABDOMINAL HYSTERECTOMY  1972  . APPENDECTOMY  1972  . BREAST ENHANCEMENT SURGERY  1982  . BREAST IMPLANT REMOVAL Right 09/13/2016   Procedure: REMOVAL RIGHT BREAST IMPLANT;  Surgeon: Irene Limbo, MD;  Location: [facility];  Service: Plastics;  Laterality: Right;  . BREAST LUMPECTOMY WITH RADIOACTIVE SEED AND SENTINEL LYMPH NODE BIOPSY Right 09/13/2016   Procedure: RIGHT BREAST LUMPECTOMY WITH RADIOACTIVE SEED X 2 AND SENTINEL LYMPH NODE BIOPSY;  Surgeon: Alphonsa Overall, MD;  Location: [facility];  Service: General;  Laterality: Right;  . BREAST SURGERY Right 03/14/2016   Biopsy  . CAPSULECTOMY Right 09/13/2016   Procedure: RIGHT CAPSULECTOMY;  Surgeon: Irene Limbo, MD;  Location: [place] [facility];  Service: Plastics;  Laterality: Right;  . CATARACT EXTRACTION, BILATERAL Bilateral 08/10/17 right eye, 08/31/17 left eye  . MASS EXCISION Left 02/28/2018   Path: benign.  Procedure: EXCISIONLEFT MEDIAL THIGH MASS ERAS PATHWAY;  Surgeon: Erroll Luna, MD;  Location: [place];  Service: General;  Laterality: Left;  . PORTACATH PLACEMENT N/A 03/15/2016   Procedure: INSERTION PORT-A-CATH WITH [place];  Surgeon: Alphonsa Overall, MD;  Location: [facility];  Service: General;  Laterality: N/A;  . PORTACATH REMOVAL  07/2017  . surgical repair left ankle Left 2009   s/p Fall   . TONSILLECTOMY AND ADENOIDECTOMY  1948   Age 78    There were no vitals filed for this visit.  Subjective Assessment - 10/16/18 1628    Subjective  Pt reports not much pain, still some trouble with stairs    Currently in Pain?  No/denies                       [facility] Adult PT Treatment/Exercise - 10/16/18 0001      Knee/Hip  Exercises: Stretches   Passive Hamstring Stretch  Right;2 reps;30 seconds    ITB Stretch  Right;3 reps;30 seconds    Piriformis Stretch  Right;3 reps;30 seconds      Knee/Hip Exercises: Aerobic   Nustep  L5 UE and LE for 6 min       Knee/Hip Exercises: Standing   Hip Flexion Limitations  standing march 2x10 2lb on right     Abduction Limitations  2lb on right 2x10 each     Extension Limitations  2 lb on right 2x10 each     Lateral Step Up Limitations  step up and over lateral X 10 bilat 6 inch step holding on to counter    Forward Step Up  15 reps;Right;Step Height: 6"    SLS  30 sec X 2 bilat with intermittent UE support needed      Knee/Hip Exercises: Seated   Other Seated Knee/Hip Exercises  hip IR and ER with greeen band for Rt leg X 20 ea      Knee/Hip Exercises: Supine   Bridges  Strengthening;Both;1 set;20 reps    Other Supine Knee/Hip Exercises  supine clam shell x20 green      Knee/Hip Exercises: Sidelying   Hip ABduction  Strengthening;Both;1 set;20 reps   green     Moist Heat Therapy   Number Minutes Moist Heat  10 Minutes   post tx     Manual Therapy   Joint Mobilization  post capsule stretch R hip ,      Soft tissue mobilization  trigger point release to gluteals and IT band     Manual Traction  Rt LE LAD x 5, 2 min oscillations               PT Short Term Goals - 10/10/18 1245      PT SHORT TERM GOAL #1   Title  Patient will increase right hip strength to 4/5     Baseline  has not yet met goal     Time  3    Period  Weeks    Status  On-going      PT SHORT TERM GOAL #2   Title  Patient will demonstrate full pain free right hip ROM    Baseline  continues to have pain with end ranges     Time  3    Period  Weeks    Status  On-going    Target Date  10/17/18      PT SHORT TERM GOAL #3   Title  Patient will be independent with basic stretching and strengthening program     Baseline  basic , done in bed     Time  3    Period  Weeks    Status   On-going        PT Long Term  Goals - 09/26/18 1423      PT LONG TERM GOAL #1   Title  Patient will sleep through the night without increased pain     Baseline  continues to have significant pain at night     Time  6    Period  Weeks    Status  On-going    Target Date  11/07/18      PT LONG TERM GOAL #2   Title  Patient will ambualte 3000' without increased pain     Baseline  continues to have difficulty ambulating     Time  6    Period  Weeks    Status  On-going      PT LONG TERM GOAL #3   Title  Patieatient witll demonstrate a 40% limitation on FOTO     Baseline  50% better     Time  6    Period  Weeks    Status  On-going      PT LONG TERM GOAL #4   Title  Pt will become more aware of posture, lifting as she completes ADLs, work tasks (sitting less) and walks    Time  6    Period  Weeks    Status  On-going      PT LONG TERM GOAL #5   Title  Pt will be able to sleep with no more than min disturbance of pain from low back/hip.     Baseline  waker her if she lies on her side     Time  6    Period  Weeks    Status  On-going            Plan - 10/16/18 1636    Clinical Impression Statement  Pt had good tolerance to progression of strenghtening today. She does still have some difficulty with SLS and stairs. She will continue to benefit from PT.     Stability/Clinical Decision Making  Evolving/Moderate complexity    Rehab Potential  Good    PT Frequency  2x / week    PT Duration  6 weeks    PT Treatment/Interventions  ADLs/Self Care Home Management;Cryotherapy;Ultrasound;Moist Heat;Balance training;Dry needling;Therapeutic exercise;Patient/family education;Manual techniques;Passive range of motion;Therapeutic activities;Iontophoresis 28m/ml Dexamethasone;Electrical Stimulation;Neuromuscular re-education;Taping    PT Next Visit Plan  push cardio to begin.  add in more standing if able to strengthen, load.  Cont manual/LAD  and DN if needed.     PT Home Exercise Plan   Hamstring, ITB, knee to opp shoulder (piriformis), bridge with band, clam, sidelying clam and QL stretch if needed.     Consulted and Agree with Plan of Care  Patient       Patient will benefit from skilled therapeutic intervention in order to improve the following deficits and impairments:  Decreased balance, Decreased mobility, Impaired sensation, Improper body mechanics, Pain, Postural dysfunction, Impaired flexibility, Impaired UE functional use, Increased fascial restricitons, Decreased strength, Decreased range of motion, Decreased activity tolerance, Decreased endurance  Visit Diagnosis: Pain in right hip  Other abnormalities of gait and mobility  Muscle weakness (generalized)  Abnormal posture  Sacroiliac joint pain  PHYSICAL THERAPY DISCHARGE SUMMARY  Visits from Start of Care: 9  Current functional level related to goals / functional outcomes: Did not return for follow up    Remaining deficits: Unknown    Education / Equipment: Unknown   Plan: Patient agrees to discharge.  Patient goals were not met. Patient is being discharged due to not returning since  the last visit.  ?????       Problem List Patient Active Problem List   Diagnosis Date Noted  . Other long term (current) drug therapy 06/14/2017  . Anxiety and depression   . History of breast cancer 03/02/2017  . History of therapeutic radiation 03/02/2017  . Breast pain, right 10/03/2016  . Type 2 diabetes mellitus ([place]) 08/23/2016  . Hypertension associated with diabetes ([place]) 08/23/2016  . Hypothyroidism 08/23/2016  . Port catheter in place 05/09/2016  . Peripheral neuropathy 05/09/2016  . Constipation 04/19/2016  . Breast cancer of upper-inner quadrant of right female breast ([place]) 03/09/2016  . Major depressive disorder, recurrent ([place]) 02/27/2014  . Uncomplicated alcohol dependence ([place]) 02/19/2014    Marissa Hall 10/16/2018, 4:37 PM  [facility] [address] [place], [place], [address] Phone: [phone]   Fax:  [phone]  Name: Marissa Hall MRN: [id number] Date of Birth: 1940/08/31

## 2018-11-08 ENCOUNTER — Other Ambulatory Visit: Payer: Self-pay

## 2018-11-08 ENCOUNTER — Ambulatory Visit (INDEPENDENT_AMBULATORY_CARE_PROVIDER_SITE_OTHER): Payer: Medicare Other | Admitting: Family Medicine

## 2018-11-08 ENCOUNTER — Encounter: Payer: Self-pay | Admitting: Family Medicine

## 2018-11-08 VITALS — Temp 97.6°F

## 2018-11-08 DIAGNOSIS — G6289 Other specified polyneuropathies: Secondary | ICD-10-CM

## 2018-11-08 DIAGNOSIS — M545 Low back pain, unspecified: Secondary | ICD-10-CM

## 2018-11-08 DIAGNOSIS — F331 Major depressive disorder, recurrent, moderate: Secondary | ICD-10-CM | POA: Diagnosis not present

## 2018-11-08 DIAGNOSIS — G8929 Other chronic pain: Secondary | ICD-10-CM

## 2018-11-08 MED ORDER — PREGABALIN 50 MG PO CAPS: 50.0000 mg | ORAL_CAPSULE | Freq: Two times a day (BID) | ORAL | 0 refills | Status: DC

## 2018-11-08 MED ORDER — MELOXICAM 15 MG PO TABS: 15.0000 mg | ORAL_TABLET | Freq: Every day | ORAL | 0 refills | Status: DC

## 2018-11-08 MED ORDER — FLUOXETINE HCL 20 MG PO TABS: 20.0000 mg | ORAL_TABLET | Freq: Every day | ORAL | 0 refills | Status: DC

## 2018-11-08 NOTE — Progress Notes (Signed)
Virtual Visit via Video Note  I connected with pt on 11/08/18 at 11:20 AM EDT by a video enabled telemedicine application and verified that I am speaking with the correct person using two identifiers.  Location patient: home Location provider:work or home office Persons participating in the virtual visit: patient, provider  I discussed the limitations of evaluation and management by telemedicine and the availability of in person appointments. The patient expressed understanding and agreed to proceed.   HPI: Marissa Hall being seen today for  She has DM and hypothyroidism managed by endocrinologist.   ROS: See pertinent positives and negatives per HPI.  Past Medical History:  Diagnosis Date  . Anxiety and depression 1964   oncologist started duloxetine 12/2016  . Breast cancer ([place]) 03/14/2016   Clinical stage 2A: (triple neg): Right breast, upper inner quadrant, 03/2016.  Neoadjuvant chemo x 5 cycles,lumpectomy 4 mo later, then RT started 10/2016.  Adjuvant Xeloda [id number].  SWOG research trial pt 04/2017--pt randomized to pembrolizumab immunotherapy.  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  [name] Kitchen Cataracts, bilateral 07/2017  . 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 Depression 1964   Patient states since age 32  . Diabetes mellitus with complication ([place]) 2376   managed by endocrinology.  A1c Mar 12, 2018 was 7.0% at Dr. Shirlyn Goltz.   [name] Kitchen GERD (gastroesophageal reflux disease) 2013  . Hyperlipidemia 1986  . Hypertension 2008  . Hypothyroidism 1988   Diagnosed in her 69s.  Managed by Endocrinologist  . Osteoporosis 2015   pt states "osteopenia", but then says that she refused to take the rx med for this condition, so I suspect she had osteoporosis.  . Peripheral neuropathy 2017   Patient states diabetic neuropathy in feet prior to starting chemotherapy and  then worsened by chemo.   [name] Kitchen TIA (transient ischemic attack) 03/26/2011    Past Surgical History:  Procedure Laterality Date  . ABDOMINAL HYSTERECTOMY  1972  . APPENDECTOMY  1972  . BREAST ENHANCEMENT SURGERY  1982  . BREAST IMPLANT REMOVAL Right 09/13/2016   Procedure: REMOVAL RIGHT BREAST IMPLANT;  Surgeon: Irene Limbo, MD;  Location: [place];  Service: Plastics;  Laterality: Right;  . BREAST LUMPECTOMY WITH RADIOACTIVE SEED AND SENTINEL LYMPH NODE BIOPSY Right 09/13/2016   Procedure: RIGHT BREAST LUMPECTOMY WITH RADIOACTIVE SEED X 2 AND SENTINEL LYMPH NODE BIOPSY;  Surgeon: Alphonsa Overall, MD;  Location: [place];  Service: General;  Laterality: Right;  . BREAST SURGERY Right 03/14/2016   Biopsy  . CAPSULECTOMY Right 09/13/2016   Procedure: RIGHT CAPSULECTOMY;  Surgeon: Irene Limbo, MD;  Location: [place];  Service: Plastics;  Laterality: Right;  . CATARACT EXTRACTION, BILATERAL Bilateral 08/10/17 right eye, 08/31/17 left eye  . MASS EXCISION Left 02/28/2018   Path: benign.  Procedure: EXCISIONLEFT MEDIAL THIGH MASS ERAS PATHWAY;  Surgeon: Erroll Luna, MD;  Location: [facility];  Service: General;  Laterality: Left;  . PORTACATH PLACEMENT N/A 03/15/2016   Procedure: INSERTION PORT-A-CATH WITH [place];  Surgeon: Alphonsa Overall, MD;  [facility];  Service: General;  Laterality: N/A;  . PORTACATH REMOVAL  07/2017  . surgical repair left ankle Left 2009   s/p Fall   . TONSILLECTOMY AND ADENOIDECTOMY  1948   Age 75    Family History  Problem Relation Age of Onset  . Stroke Mother   .  Suicidality Father   . Stroke Brother   . 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  No signif alc.   Current Outpatient Medications:  .  atorvastatin (LIPITOR) 80 MG tablet, Take 80 mg by mouth every evening. , Disp: , Rfl:  .  ibuprofen (ADVIL,MOTRIN) 200 MG tablet, Take 400 mg by mouth every  6 (six) hours as needed for mild pain or moderate pain., Disp: , Rfl:  .  levothyroxine (SYNTHROID, LEVOTHROID) 112 MCG tablet, Take 1 tablet by mouth daily., Disp: , Rfl:  .  lisinopril (PRINIVIL,ZESTRIL) 20 MG tablet, Take 20 mg by mouth every evening. , Disp: , Rfl:  .  metFORMIN (GLUCOPHAGE-XR) 500 MG 24 hr tablet, Take 1,000 mg by mouth daily before supper. , Disp: , Rfl:  .  omeprazole (PRILOSEC) 40 MG capsule, Take 1 capsule (40 mg total) by mouth daily. (Patient taking differently: Take 40 mg as needed by mouth (Heart Burn). ), Disp: 30 capsule, Rfl: 2 .  pyridoxine (B-6) 100 MG tablet, Take 100 mg by mouth daily. , Disp: , Rfl:  .  traZODone (DESYREL) 50 MG tablet, 1-3 tabs po qhs prn insomnia, Disp: 60 tablet, Rfl: 1  EXAM:  VITALS per patient if applicable:  GENERAL: alert, oriented, appears well and in no acute distress  HEENT: atraumatic, conjunttiva clear, no obvious abnormalities on inspection of external nose and ears  NECK: normal movements of the head and neck  LUNGS: on inspection no signs of respiratory distress, breathing rate appears normal, no obvious gross SOB, gasping or wheezing  CV: no obvious cyanosis  MS: moves all visible extremities without noticeable abnormality  PSYCH/NEURO: pleasant and cooperative, no obvious depression or anxiety, speech and thought processing grossly intact  LABS: none    Chemistry      Component Value Date/Time   NA 139 05/21/2018 0918   NA 136 05/01/2017 0914   K 4.7 05/21/2018 0918   K 4.5 05/01/2017 0914   CL 103 05/21/2018 0918   CO2 26 05/21/2018 0918   CO2 24 05/01/2017 0914   BUN 18 05/21/2018 0918   BUN 14.9 05/01/2017 0914   CREATININE 0.92 05/21/2018 0918   CREATININE 0.8 05/01/2017 0914      Component Value Date/Time   CALCIUM 9.9 05/21/2018 0918   CALCIUM 9.6 05/01/2017 0914   ALKPHOS 74 05/21/2018 0918   ALKPHOS 75 05/01/2017 0914   AST 38 05/21/2018 0918   AST 51 (H) 05/01/2017 0914   ALT 45 (H)  05/21/2018 0918   ALT 62 (H) 05/01/2017 0914   BILITOT 1.4 (H) 05/21/2018 0918   BILITOT 1.30 (H) 05/01/2017 0914     Lab Results  Component Value Date   HGBA1C 7.0 03/12/2018   Lab Results  Component Value Date   TSH 16.530 (H) 05/21/2018    ASSESSMENT AND PLAN:  Discussed the following assessment and plan:  No diagnosis found.     I discussed the assessment and treatment plan with the patient. The patient was provided an opportunity to ask questions and all were answered. The patient agreed with the plan and demonstrated an understanding of the instructions.   The patient was advised to call back or seek an in-person evaluation if the symptoms worsen or if the condition fails to improve as anticipated.  F/u:   Signed:  Crissie Sickles, MD           11/08/2018

## 2018-11-08 NOTE — Progress Notes (Signed)
Virtual Visit via Video Note  I connected with pt on 11/08/18 at 11:20 AM EDT by a video enabled telemedicine application and verified that I am speaking with the correct person using two identifiers.  Location patient: home Location provider:work or home office Persons participating in the virtual visit: patient, provider  I discussed the limitations of evaluation and management by telemedicine and the availability of in person appointments. The patient expressed understanding and agreed to proceed.  Telemedicine visit is a necessity given the COVID-19 restrictions in place at the current time.  HPI: 78 y/o WF being seen today for pain complaints. (Of note, she has DM and hypothyroidism managed by endocrinologist).  Has chronic low back pain, mild/mod intensity, says back feels weak. Feeling unsteady on feet, a little off balance, clumsy. Fingers sore and stiff.  No swelling or redness of joints. Neuropathy on feet and legs really bad lately.  Chronic bilat feet tingling, decreased sensation in both feet, some nagging pain in same distribution-"it stays with me 24/7". Takes ibup 400mg  once a day but only occasionally.  Depressed a lot lately.  Hx of recurrent MDD for decades. 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 Has emotional blunting.  Isolating from others.  Poor motivation, poor focus.   Lives by herself.  No suicidal ideation but sometimes has thoughts like "what's the use".  No HI. No signif anxiety, no panic.  She is self medicating with alcohol.  Drinks scotch--about 1 fifth or more per week. Says effexor xr was what worked best for her. She says cymbalta caused too much sedation.  ROS: ROS: no CP, no SOB, no wheezing, no cough, no dizziness, no HAs, no rashes, no melena/hematochezia.  No polyuria or polydipsia.  No fevers.  Past Medical History:  Diagnosis Date  . Anxiety and depression 1964   oncologist  started duloxetine 12/2016  . Breast cancer ([place]) 03/14/2016   Clinical stage 2A: (triple neg): Right breast, upper inner quadrant, 03/2016.  Neoadjuvant chemo x 5 cycles,lumpectomy 4 mo later, then RT started 10/2016.  Adjuvant Xeloda [id number].  SWOG research trial pt 04/2017--pt randomized to pembrolizumab immunotherapy.  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  [name] Kitchen Cataracts, bilateral 07/2017  . 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 Depression 1964   Patient states since age 30  . Diabetes mellitus with complication ([place]) 1607   managed by endocrinology.  A1c Mar 12, 2018 was 7.0% at Dr. Shirlyn Goltz.   [name] Kitchen GERD (gastroesophageal reflux disease) 2013  . Hyperlipidemia 1986  . Hypertension 2008  . Hypothyroidism 1988   Diagnosed in her 39s.  Managed by Endocrinologist  . Osteoporosis 2015   pt states "osteopenia", but then says that she refused to take the rx med for this condition, so I suspect she had osteoporosis.  . Peripheral neuropathy 2017   Patient states diabetic neuropathy in feet prior to starting chemotherapy and then worsened by chemo.   [name] Kitchen TIA (transient ischemic attack) 03/26/2011    Past Surgical History:  Procedure Laterality Date  . ABDOMINAL HYSTERECTOMY  1972  . APPENDECTOMY  1972  . BREAST ENHANCEMENT SURGERY  1982  . BREAST IMPLANT REMOVAL Right 09/13/2016   Procedure: REMOVAL RIGHT BREAST IMPLANT;  Surgeon: Irene Limbo, MD;  Location: [facility];  Service: Plastics;  Laterality: Right;  . BREAST LUMPECTOMY WITH RADIOACTIVE SEED AND SENTINEL LYMPH NODE BIOPSY Right 09/13/2016   Procedure: RIGHT BREAST LUMPECTOMY WITH RADIOACTIVE SEED X 2 AND SENTINEL LYMPH NODE BIOPSY;  Surgeon: Alphonsa Overall, MD;  Location: [facility];  Service: General;  Laterality: Right;  . BREAST SURGERY Right 03/14/2016   Biopsy   . CAPSULECTOMY Right 09/13/2016   Procedure: RIGHT CAPSULECTOMY;  Surgeon: Irene Limbo, MD;  Location: [facility];  Service: Plastics;  Laterality: Right;  . CATARACT EXTRACTION, BILATERAL Bilateral 08/10/17 right eye, 08/31/17 left eye  . MASS EXCISION Left 02/28/2018   Path: benign.  Procedure: EXCISIONLEFT MEDIAL THIGH MASS ERAS PATHWAY;  Surgeon: Erroll Luna, MD;  Location: [facility];  Service: General;  Laterality: Left;  . PORTACATH PLACEMENT N/A 03/15/2016   Procedure: INSERTION PORT-A-CATH WITH [place];  Surgeon: Alphonsa Overall, MD;  [facility];  Service: General;  Laterality: N/A;  . PORTACATH REMOVAL  07/2017  . surgical repair left ankle Left 2009   s/p Fall   . TONSILLECTOMY AND ADENOIDECTOMY  1948   Age 69    Family History  Problem Relation Age of Onset  . Stroke Mother   . Suicidality Father   . Stroke Brother   . 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  No signif alc.   Current Outpatient Medications:  .  atorvastatin (LIPITOR) 80 MG tablet, Take 80 mg by mouth every evening. , Disp: , Rfl:  .  ibuprofen (ADVIL,MOTRIN) 200 MG tablet, Take 400 mg by mouth every 6 (six) hours as needed for mild pain or moderate pain., Disp: , Rfl:  .  levothyroxine (SYNTHROID, LEVOTHROID) 112 MCG tablet, Take 1 tablet by mouth daily., Disp: , Rfl:  .  lisinopril (PRINIVIL,ZESTRIL) 20 MG tablet, Take 20 mg by mouth every evening. , Disp: , Rfl:  .  metFORMIN (GLUCOPHAGE-XR) 500 MG 24 hr tablet, Take 1,000 mg by mouth daily before supper. , Disp: , Rfl:  .  omeprazole (PRILOSEC) 40 MG capsule, Take 1 capsule (40 mg total) by mouth daily. (Patient taking differently: Take 40 mg as needed by mouth (Heart Burn). ), Disp: 30 capsule, Rfl: 2 .  pyridoxine (B-6) 100 MG tablet, Take 100 mg by mouth daily. , Disp: , Rfl:  .  traZODone (DESYREL) 50 MG tablet, 1-3 tabs po qhs prn insomnia, Disp: 60 tablet, Rfl:  1  EXAM:  VITALS per patient if applicable: Temp 59.1 F (36.4 C) (Oral)    GENERAL: alert, oriented, appears well and in no acute distress  HEENT: atraumatic, conjunttiva clear, no obvious abnormalities on inspection of external nose and ears  NECK: normal movements of the head and neck  LUNGS: on inspection no signs of respiratory distress, breathing rate appears normal, no obvious gross SOB, gasping or wheezing  CV: no obvious cyanosis  MS: moves all visible extremities without noticeable abnormality  PSYCH/NEURO: pleasant and cooperative, no obvious depression or anxiety, speech and thought processing grossly intact  LABS: none    Chemistry      Component Value Date/Time   NA 139 05/21/2018 0918   NA 136 05/01/2017 0914   K 4.7 05/21/2018 0918   K 4.5 05/01/2017 0914   CL 103 05/21/2018 0918   CO2 26 05/21/2018 0918   CO2 24 05/01/2017 0914   BUN 18 05/21/2018 0918   BUN 14.9 05/01/2017 0914   CREATININE 0.92 05/21/2018 6384  CREATININE 0.8 05/01/2017 0914      Component Value Date/Time   CALCIUM 9.9 05/21/2018 0918   CALCIUM 9.6 05/01/2017 0914   ALKPHOS 74 05/21/2018 0918   ALKPHOS 75 05/01/2017 0914   AST 38 05/21/2018 0918   AST 51 (H) 05/01/2017 0914   ALT 45 (H) 05/21/2018 0918   ALT 62 (H) 05/01/2017 0914   BILITOT 1.4 (H) 05/21/2018 0918   BILITOT 1.30 (H) 05/01/2017 0914     Lab Results  Component Value Date   HGBA1C 7.0 03/12/2018   Lab Results  Component Value Date   TSH 16.530 (H) 05/21/2018    ASSESSMENT AND PLAN:  Discussed the following assessment and plan:  1) MDD: start fluoxetine 20mg  qd.  Therapeutic expectations and side effect profile of medication discussed today.  Patient's questions answered.  2) Chronic LBP, suspected DDD + musculo-ligamentous etiology.  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  NO sign of inflammatory arthropathy. Stop prn use of ibup. Start daily dose of meloxicam 15mg  with food.  3) Chronic bilat LL  neuropathic pain and paresthesias: diabetic PN + hx of chemotherapy. Failed gabapentin.  Intol to sedative effect of cymbalta. Start trial of lyrica->start low with 50mg  bid and likely titrate up at next f/u in 2 wks.    I discussed the assessment and treatment plan with the patient. The patient was provided an opportunity to ask questions and all were answered. The patient agreed with the plan and demonstrated an understanding of the instructions.   The patient was advised to call back or seek an in-person evaluation if the symptoms worsen or if the condition fails to improve as anticipated.  F/u: 2 wks  Signed:  Crissie Sickles, MD           11/08/2018

## 2018-11-12 ENCOUNTER — Telehealth: Payer: Self-pay | Admitting: Hematology and Oncology

## 2018-11-12 ENCOUNTER — Telehealth: Payer: Self-pay | Admitting: Family Medicine

## 2018-11-12 NOTE — Telephone Encounter (Signed)
Pt was called and states taking the Meloxicam has helped and she no longer is having the pain. She is going to be starting Vit B6 this week and hopes that will make it even better. Pt does not want any other medication right now if the meloxicam is working and Dr Anitra Lauth is okay with plan.   Pt was told I would send message to Dr Anitra Lauth and call her back next week to make sure that she continues to be having no pain, as well as to whether Dr Anitra Lauth agreed with plan.

## 2018-11-12 NOTE — Assessment & Plan Note (Signed)
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  T2 N0 stage 2A clinical stage  Treatment summary: 1. Neoadjuvant chemotherapy with dose dense Adriamycin and Cytoxan 4 followed by Abraxane weekly 5(stopped early due to neuropathy) 2. 09/13/2016: Right lumpectomy: IDC grade 3, 2 foci, 2 cm and 1.1 cm, 0/3 lymph nodes negative margins negative, ER 0%, PR 0%, HER-2 negative ratio 1.02, Ki-67 60%, RCB-II; [id number] Stage 2A (with plastic surgery removing the ruptured implant) 3. Followed by radiation therapy completed 12/06/2016 4.Adjuvant Xeloda 1000 mg by mouth twice a day 2 weeks on one week off 12/30/2016-Oct 2018 5.SWOG 1418 clinical trial Pembrolizumabcycle 1 given 05/03/2017(patient decided to discontinue clinical trial) -----------------------------------------------------------------------------------------------------------------------------------------  Surveillance: 1.Breast exam 02/21/2018: Benign 2.mammogram 04/12/2018 at [facility]: No evidence of malignancy. Breast density categoryB  Symptoms: Fatigue but it is not affecting her activities. No evidence of breast cancer recurrence. Severe financial distress: She is trying to make ends meet.  Return to clinicin 1 year for follow up

## 2018-11-12 NOTE — Telephone Encounter (Signed)
Patient was seen by Dr. Anitra Lauth last week and he prescribed Lyrica. Because of the cost ($160 with insurance) patient declined picking up meds  Patient requesting for another medication [facility] in [place]

## 2018-11-12 NOTE — Telephone Encounter (Signed)
Talk with patient regarding video visit

## 2018-11-13 NOTE — Telephone Encounter (Signed)
Taking meloxicam every day is fine with me. No further medicines needed.

## 2018-11-14 ENCOUNTER — Telehealth: Payer: Self-pay | Admitting: *Deleted

## 2018-11-14 NOTE — Telephone Encounter (Signed)
Patient was advised of PCP recommendations.

## 2018-11-14 NOTE — Telephone Encounter (Signed)
LVM for patient to confirm her virtual visit for next week.  Asked patient to call when she has a chance to review her medications and discuss any new symptoms over the phone. Thanked patient for her time.  Foye Spurling, BSN, RN Clinical Research Nurse 11/14/2018 10:26 AM

## 2018-11-19 ENCOUNTER — Telehealth: Payer: Self-pay | Admitting: Hematology and Oncology

## 2018-11-19 ENCOUNTER — Telehealth: Payer: Self-pay | Admitting: *Deleted

## 2018-11-19 NOTE — Telephone Encounter (Signed)
Foye Spurling, BSN, RN Clinical Research Nurse 11/19/2018 11:00 AM

## 2018-11-19 NOTE — Telephone Encounter (Addendum)
18 Month Follow UpVisit for [id number] Clinical Trial: This visit completed virtually by phone and video due to [place] Pandemic safety precautions limiting in clinic visits.  PROs; Research nurse mailed PROs to patient along with pre-paid return addressed envelopes to return to research nurse after completion.  Patient confirmed she received them and will complete and return sometime this week.  Labs; Unable to complete at this timepoint due to [place] restrictions.  Concomitant medicationsreviewedwith patient;Reviewed current medication list by phone 11/19/18,. Patient recently had a virtual visit with her PCP, Dr. Anitra Lauth, and was prescribed Meloxicam, Pregabalin and Fluoxetine. She started taking Meloxicam for arthritis. She has not started Pregabalin due to not approved by [facility] yet. She also has not started the Fluoxetine yet as she thinks Dr. Anitra Lauth wanted her to wait until after she started the Meloxiam for a few weeks. She stopped Ibuprofen due to taking Meloxicam.  She also reports she has not taken Omeprazole in almost 6 months due to not needing it anymore. Medication list updated accordingly.  AEs reviewed with patient; Research nurse reviewed with patient by phone and Dr. Lindi Adie by video.  See AE table below.  H&P, PS,Toxicityand Recurrence assessment; Completed by Dr. Lindi Adie via video 11/20/18.[name] KitchenPhysical exam not completed due to video visit. See MD encounter note dated 11/20/2018.   AEs12/16/19-6/16/20 Event Grade Onset Date End Date Status Comment Attribution toPembrolizumab Immune Related Radiation Related  Back Pain 1 04/10/18  ongoing  unrelated no no  Insomnia  2 02/09/18  ongoing  unrelated no no  Decreased WBC 1 02/22/18  ongoing  unrelated no no  Fatigue 1 02/09/18  ongoing Baseline, intermittent unrelated no no  Increased ALT 1 05/21/18  ongoing Present at Baseline and now recurring unlikely no no  Increased Bilirubin 1 05/21/18  ongoing Present  at Baseline and now recurring unlikely no no  Neuropathy 2 6 11/08/18 ongoing Increased from baseline X X X  Depression 2  11/08/18 ongoing Increased from baseline X X X  Right Hip Pain 2 07/09/18 11/19/18  Decreased with PHT X X X  Right Hip Pain 1 11/19/18  ongoing  X X X   Foye Spurling, BSN, RN Clinical Research Nurse 11/20/2018 11:51 AM

## 2018-11-19 NOTE — Telephone Encounter (Signed)
Confirmed appt and verified info. °

## 2018-11-19 NOTE — Progress Notes (Addendum)
HEMATOLOGY-ONCOLOGY [place] VISIT PROGRESS NOTE  I connected with Marissa Hall on 11/20/2018 at  9:30 AM EDT by Webex video conference and verified that I am speaking with the correct person using two identifiers.  I discussed the limitations, risks, security and privacy concerns of performing an evaluation and management service by Doximity and the availability of in person appointments.  I also discussed with the patient that there may be a patient responsible charge related to this service. The patient expressed understanding and agreed to proceed.  Patient's Location: Home Physician Location: Clinic  CHIEF COMPLIANT: Surveillance of triple negative breast cancer  INTERVAL HISTORY: Marissa Hall is a 78 y.o. female with above-mentioned history of triple negative breast cancer treated with neoadjuvant chemotherapy, lumpectomy, radiation, and adjuvant Xeloda. She was a participant in the SWOG [id number] clinical trial, and completed 2 cycles of pembrolizumab before discontinuing treatment. I last saw her 6 months ago. She presents over WebEx today for surveillance follow-up.   Oncology History  Breast cancer of upper-inner quadrant of right female breast ([place])  02/29/2016 Initial Diagnosis   Right breast palpable mass (with silicone implants 3491), 3.5 cm on MRI, additional 3 cm anterior linear enhancement? 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 Ki-67 60%, T2 N0 stage 2A clinical stage   03/14/2016 Procedure   Right breast biopsy upper inner quadrant: IDC grade 3   03/28/2016 -  Neo-Adjuvant Chemotherapy   Neoadjuvant chemotherapy with dose dense Adriamycin and Cytoxan followed by Abraxane weekly 5 ( patient is diabetic and cannot take steroids)   07/15/2016 Breast MRI   Right breast: Spiculated mass 1.5 cm significantly smaller compared to prior,NME previously seen is not noted, no abnormal lymph nodes   09/13/2016 Surgery   Removal of the silicone implant due to intracapsular  rupture and capsulectomy (Dr.Thimappa)   09/13/2016 Surgery   Right lumpectomy: IDC grade 3, 2 foci, 2 cm and 1.1 cm, 0/3 lymph nodes negative margins negative, ER 0%, PR 0%, HER-2 negative ratio 1.02, Ki-67 60%, RCB-II; ypT2ypN0 Stage 2A    10/20/2016 - 12/06/2016 Radiation Therapy   Adjuvant radiation therapy   12/30/2016 - 04/05/2017 Chemotherapy   Xeloda 1000 mg by mouth twice a day adjuvant therapy x 4 cycles    05/03/2017 - 05/24/2017 Chemotherapy   SWOG S 1418 Pembrolizumab on clinical trial stopped after 2 doses by patient preference (not due to toxicities .)     REVIEW OF SYSTEMS:   Constitutional: Denies fevers, chills or abnormal weight loss Eyes: Denies blurriness of vision Ears, nose, mouth, throat, and face: Denies mucositis or sore throat Respiratory: Denies cough, dyspnea or wheezes Cardiovascular: Denies palpitation, chest discomfort Gastrointestinal:  Denies nausea, heartburn or change in bowel habits Skin: Denies abnormal skin rashes Lymphatics: Denies new lymphadenopathy or easy bruising Neurological:Denies numbness, tingling or new weaknesses Behavioral/Psych: Mood is stable, no new changes  Extremities: No lower extremity edema Breast: denies any pain or lumps or nodules in either breasts All other systems were reviewed with the patient and are negative.  Observations/Objective:  ECOG: 1 There were no vitals filed for this visit. There is no height or weight on file to calculate BMI.   I have reviewed the data as listed CMP Latest Ref Rng & Units 05/21/2018 02/22/2018 12/01/2017  Glucose 70 - 99 mg/dL 133(H) 153(H) 163(H)  BUN 8 - 23 mg/dL '18 15 15  ' Creatinine 0.44 - 1.00 mg/dL 0.92 0.79 0.77  Sodium 135 - 145 mmol/L 139 140 140  Potassium 3.5 -  5.1 mmol/L 4.7 4.6 4.8  Chloride 98 - 111 mmol/L 103 105 105  CO2 22 - 32 mmol/L '26 28 28  ' Calcium 8.9 - 10.3 mg/dL 9.9 9.5 9.9  Total Protein 6.5 - 8.1 g/dL 7.8 7.2 7.2  Total Bilirubin 0.3 - 1.2 mg/dL 1.4(H)  1.0 1.3(H)  Alkaline Phos 38 - 126 U/L 74 74 63  AST 15 - 41 U/L 38 28 56(H)  ALT 0 - 44 U/L 45(H) 36 54(H)    Lab Results  Component Value Date   WBC 3.6 (L) 05/21/2018   HGB 13.8 05/21/2018   HCT 42.1 05/21/2018   MCV 99.3 05/21/2018   PLT 153 05/21/2018   NEUTROABS 1.9 05/21/2018      Assessment Plan:  Breast cancer of upper-inner quadrant of right female breast ([place]) 02/29/2016: Right breast palpable mass (with silicone implants 0312), 3.5 cm on MRI, additional 3 cm anterior linear enhancement (biopsy 03/14/2016 IDC grade 3); 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  T2 N0 stage 2A clinical stage  Treatment summary: 1. Neoadjuvant chemotherapy with dose dense Adriamycin and Cytoxan 4 followed by Abraxane weekly 5(stopped early due to neuropathy) 2. 09/13/2016: Right lumpectomy: IDC grade 3, 2 foci, 2 cm and 1.1 cm, 0/3 lymph nodes negative margins negative, ER 0%, PR 0%, HER-2 negative ratio 1.02, Ki-67 60%, RCB-II; ypT2ypN0 Stage 2A (with plastic surgery removing the ruptured implant) 3. Followed by radiation therapy completed 12/06/2016 4.Adjuvant Xeloda 1000 mg by mouth twice a day 2 weeks on one week off 12/30/2016-Oct 2018 5.SWOG 1418 clinical trial Pembrolizumabcycle 1 given 05/03/2017(patient decided to discontinue clinical trial) ----------------------------------------------------------------------------------------------------------------------------------------- Surveillance: 1.Breast exam 02/21/2018: Benign 2.mammogram 04/12/2018 at [facility]: No evidence of malignancy. Breast density categoryB  Symptoms: Fatigue but it is not affecting her activities. No evidence of breast cancer recurrence. Severe financial distress: She is trying to make ends meet. Chemo induced neuropathy: moderately severe.  Muscle pains and Jt pains: on Meloxicam Trouble walking down steps  Seeing Dr.McGowen.  Return to clinicin 1 year for follow up.  I discussed the  assessment and treatment plan with the patient. The patient was provided an opportunity to ask questions and all were answered. The patient agreed with the plan and demonstrated an understanding of the instructions. The patient was advised to call back or seek an in-person evaluation if the symptoms worsen or if the condition fails to improve as anticipated.   I provided 15 minutes of face-to-face Doximity time during this encounter.    Rulon Eisenmenger, MD 11/20/2018   I, Molly Dorshimer, am acting as scribe for Nicholas Lose, MD.  I have reviewed the above documentation for accuracy and completeness, and I agree with the above.

## 2018-11-20 ENCOUNTER — Other Ambulatory Visit: Payer: Medicare Other

## 2018-11-20 ENCOUNTER — Ambulatory Visit: Payer: Medicare Other | Admitting: Hematology and Oncology

## 2018-11-20 ENCOUNTER — Inpatient Hospital Stay: Payer: Medicare Other | Attending: Hematology and Oncology | Admitting: Hematology and Oncology

## 2018-11-20 ENCOUNTER — Other Ambulatory Visit: Payer: Self-pay | Admitting: *Deleted

## 2018-11-20 DIAGNOSIS — C50211 Malignant neoplasm of upper-inner quadrant of right female breast: Secondary | ICD-10-CM | POA: Diagnosis not present

## 2018-11-20 DIAGNOSIS — Z9221 Personal history of antineoplastic chemotherapy: Secondary | ICD-10-CM

## 2018-11-20 DIAGNOSIS — Z171 Estrogen receptor negative status [ER-]: Secondary | ICD-10-CM | POA: Diagnosis not present

## 2018-11-20 DIAGNOSIS — Z923 Personal history of irradiation: Secondary | ICD-10-CM

## 2018-11-20 DIAGNOSIS — Z006 Encounter for examination for normal comparison and control in clinical research program: Secondary | ICD-10-CM | POA: Diagnosis not present

## 2018-11-22 ENCOUNTER — Other Ambulatory Visit: Payer: Self-pay

## 2018-11-22 ENCOUNTER — Ambulatory Visit (INDEPENDENT_AMBULATORY_CARE_PROVIDER_SITE_OTHER): Payer: Medicare Other | Admitting: Family Medicine

## 2018-11-22 ENCOUNTER — Encounter: Payer: Self-pay | Admitting: Family Medicine

## 2018-11-22 DIAGNOSIS — G6289 Other specified polyneuropathies: Secondary | ICD-10-CM | POA: Diagnosis not present

## 2018-11-22 DIAGNOSIS — M545 Low back pain, unspecified: Secondary | ICD-10-CM

## 2018-11-22 DIAGNOSIS — G8929 Other chronic pain: Secondary | ICD-10-CM

## 2018-11-22 DIAGNOSIS — F331 Major depressive disorder, recurrent, moderate: Secondary | ICD-10-CM | POA: Diagnosis not present

## 2018-11-22 MED ORDER — GABAPENTIN 100 MG PO CAPS: ORAL_CAPSULE | ORAL | 0 refills | Status: DC

## 2018-11-22 NOTE — Progress Notes (Signed)
Virtual Visit via Video Note  I connected with Marissa Hall on 11/22/18 at 10:30 AM EDT by a video enabled telemedicine application and verified that I am speaking with the correct person using two identifiers.  Location patient: home Location provider:work or home office Persons participating in the virtual visit: patient, provider  I discussed the limitations of evaluation and management by telemedicine and the availability of in person appointments. The patient expressed understanding and agreed to proceed.  Telemedicine visit is a necessity given the COVID-19 restrictions in place at the current time.  HPI: 78 y/o WF being seen today for 2 week f/u MDD, recurrent as well as for worsening of chronic LBP and bilat LL painful neuropathy.  I rx'd fluoxetine for her but she has not picked this up yet.  Depression unchanged.  No SI or HI.  Functioning fine, but does want to restart seeing a counselor.  No SI or HI.  Neuropathy pain: Her insurer would not cover lyrica for her.  She is started on meloxicam and it is helping her back. Neuropathy is still constant.  She recalls trial of 300 mg gabapentin in the past but this dose was the starting dose and caused some mild impairment.   ROS: See pertinent positives and negatives per HPI.  Past Medical History:  Diagnosis Date  . Anxiety and depression 1964   oncologist started duloxetine 12/2016  . Breast cancer ([place]) 03/14/2016   Clinical stage 2A: (triple neg): Right breast, upper inner quadrant, 03/2016.  Neoadjuvant chemo x 5 cycles,lumpectomy 4 mo later, then RT started 10/2016.  Adjuvant Xeloda [id number].  SWOG research trial Marissa Hall 04/2017--Marissa Hall randomized to pembrolizumab immunotherapy.  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 Cancer-free at 05/2018 onc f/u.  [name] Kitchen Cataracts, bilateral 07/2017  . 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 Depression 1964    Patient states since age 78  . Diabetes mellitus with complication ([place]) 1025   managed by endocrinology.  A1c Mar 12, 2018 was 7.0% at Dr. Shirlyn Goltz.   [name] Kitchen GERD (gastroesophageal reflux disease) 2013  . Hyperlipidemia 1986  . Hypertension 2008  . Hypothyroidism 1988   Diagnosed in her 45s.  Managed by Endocrinologist  . Osteoporosis 2015   Marissa Hall states "osteopenia", but then says that she refused to take the rx med for this condition, so I suspect she had osteoporosis.  . Peripheral neuropathy 2017   Patient states diabetic neuropathy in feet prior to starting chemotherapy and then worsened by chemo.   [name] Kitchen TIA (transient ischemic attack) 03/26/2011    Past Surgical History:  Procedure Laterality Date  . ABDOMINAL HYSTERECTOMY  1972  . APPENDECTOMY  1972  . BREAST ENHANCEMENT SURGERY  1982  . BREAST IMPLANT REMOVAL Right 09/13/2016   Procedure: REMOVAL RIGHT BREAST IMPLANT;  Surgeon: Irene Limbo, MD;  Location: [facility];  Service: Plastics;  Laterality: Right;  . BREAST LUMPECTOMY WITH RADIOACTIVE SEED AND SENTINEL LYMPH NODE BIOPSY Right 09/13/2016   Procedure: RIGHT BREAST LUMPECTOMY WITH RADIOACTIVE SEED X 2 AND SENTINEL LYMPH NODE BIOPSY;  Surgeon: Alphonsa Overall, MD;  Location: [facility];  Service: General;  Laterality: Right;  . BREAST SURGERY Right 03/14/2016   Biopsy  . CAPSULECTOMY Right 09/13/2016   Procedure: RIGHT CAPSULECTOMY;  Surgeon: Irene Limbo, MD;  Location: [facility];  Service: Plastics;  Laterality: Right;  . CATARACT EXTRACTION, BILATERAL Bilateral 08/10/17 right  eye, 08/31/17 left eye  . MASS EXCISION Left 02/28/2018   Path: benign.  Procedure: EXCISIONLEFT MEDIAL THIGH MASS ERAS PATHWAY;  Surgeon: Erroll Luna, MD;  Location: [facility];  Service: General;  Laterality: Left;  . PORTACATH PLACEMENT N/A 03/15/2016   Procedure: INSERTION PORT-A-CATH WITH [place];  Surgeon: Alphonsa Overall, MD;  Location: [facility];   Service: General;  Laterality: N/A;  . PORTACATH REMOVAL  07/2017  . surgical repair left ankle Left 2009   s/p Fall   . TONSILLECTOMY AND ADENOIDECTOMY  1948   Age 78    Family History  Problem Relation Age of Onset  . Stroke Mother   . Suicidality Father   . Stroke Brother   . Stroke Son     SOCIAL HX: lives by herself.   Current Outpatient Medications:  .  atorvastatin (LIPITOR) 80 MG tablet, Take 80 mg by mouth every evening. , Disp: , Rfl:  .  FLUoxetine (PROZAC) 20 MG tablet, Take 1 tablet (20 mg total) by mouth daily., Disp: 30 tablet, Rfl: 0 .  levothyroxine (SYNTHROID, LEVOTHROID) 112 MCG tablet, Take 1 tablet by mouth daily., Disp: , Rfl:  .  lisinopril (PRINIVIL,ZESTRIL) 20 MG tablet, Take 20 mg by mouth every evening. , Disp: , Rfl:  .  meloxicam (MOBIC) 15 MG tablet, Take 1 tablet (15 mg total) by mouth daily., Disp: 30 tablet, Rfl: 0 .  metFORMIN (GLUCOPHAGE-XR) 500 MG 24 hr tablet, Take 1,000 mg by mouth daily before supper. , Disp: , Rfl:  .  pyridoxine (B-6) 100 MG tablet, Take 100 mg by mouth daily. , Disp: , Rfl:  .  traZODone (DESYREL) 50 MG tablet, 1-3 tabs po qhs prn insomnia, Disp: 60 tablet, Rfl: 1  EXAM:  VITALS per patient if applicable: There were no vitals taken for this visit.   GENERAL: alert, oriented, appears well and in no acute distress  HEENT: atraumatic, conjunttiva clear, no obvious abnormalities on inspection of external nose and ears  NECK: normal movements of the head and neck  LUNGS: on inspection no signs of respiratory distress, breathing rate appears normal, no obvious gross SOB, gasping or wheezing  CV: no obvious cyanosis  MS: moves all visible extremities without noticeable abnormality  PSYCH/NEURO: pleasant and cooperative, no obvious depression or anxiety, speech and thought processing grossly intact  LABS: none today  Lab Results  Component Value Date   TSH 16.530 (H) 05/21/2018   Lab Results  Component Value Date    WBC 3.6 (L) 05/21/2018   HGB 13.8 05/21/2018   HCT 42.1 05/21/2018   MCV 99.3 05/21/2018   PLT 153 05/21/2018   Lab Results  Component Value Date   CREATININE 0.92 05/21/2018   BUN 18 05/21/2018   NA 139 05/21/2018   K 4.7 05/21/2018   CL 103 05/21/2018   CO2 26 05/21/2018   Lab Results  Component Value Date   ALT 45 (H) 05/21/2018   AST 38 05/21/2018   ALKPHOS 74 05/21/2018   BILITOT 1.4 (H) 05/21/2018   Lab Results  Component Value Date   HGBA1C 7.0 03/12/2018     ASSESSMENT AND PLAN:  Discussed the following assessment and plan:  1) MDD/dysthymia: start the fluoxetine I rx'd last visit 3 wks ago that she has not picked up yet.  Referral to psychologist ordered today.  2) Chronic LBP/DDD + musculo-ligamentous etiology. Mobic helping.  3) Chronc bilat LL neuropathic pain and paresthesias: lyrica denied by insurer. Will retry gabapentin at  low dose and slowly titrate to try to avoid oversedation. 100mg  qhs, increase by 1 tab per day every 3 days to desired effect or intol side effect. If this is not helpful at all at the 600 mg tid dosing or if still too oversedating, will retry cymbalta.    I discussed the assessment and treatment plan with the patient. The patient was provided an opportunity to ask questions and all were answered. The patient agreed with the plan and demonstrated an understanding of the instructions.   The patient was advised to call back or seek an in-person evaluation if the symptoms worsen or if the condition fails to improve as anticipated.  F/u: 1 mo in office  Signed:  Crissie Sickles, MD           11/22/2018

## 2018-11-27 ENCOUNTER — Telehealth: Payer: Self-pay | Admitting: Hematology and Oncology

## 2018-11-27 NOTE — Telephone Encounter (Signed)
Scheduled appt per 6/22 sch message - mailed letter with appt date and time

## 2018-12-11 ENCOUNTER — Other Ambulatory Visit: Payer: Self-pay | Admitting: Family Medicine

## 2018-12-11 NOTE — Telephone Encounter (Signed)
RF request for meloxicam Last OV 11/08/2018 Next OV 12/26/2018 Last RX 11/08/2018 # 30 x No refills.  Please advise.

## 2018-12-19 ENCOUNTER — Ambulatory Visit (INDEPENDENT_AMBULATORY_CARE_PROVIDER_SITE_OTHER): Payer: Medicare Other | Admitting: Psychology

## 2018-12-19 DIAGNOSIS — F331 Major depressive disorder, recurrent, moderate: Secondary | ICD-10-CM

## 2018-12-26 ENCOUNTER — Encounter: Payer: Self-pay | Admitting: Family Medicine

## 2018-12-26 ENCOUNTER — Ambulatory Visit (INDEPENDENT_AMBULATORY_CARE_PROVIDER_SITE_OTHER): Payer: Medicare Other | Admitting: Family Medicine

## 2018-12-26 ENCOUNTER — Other Ambulatory Visit: Payer: Self-pay

## 2018-12-26 VITALS — BP 135/76 | HR 60 | Temp 98.0°F | Resp 18 | Ht 68.5 in | Wt 170.4 lb

## 2018-12-26 DIAGNOSIS — R300 Dysuria: Secondary | ICD-10-CM

## 2018-12-26 DIAGNOSIS — G8929 Other chronic pain: Secondary | ICD-10-CM

## 2018-12-26 DIAGNOSIS — H6123 Impacted cerumen, bilateral: Secondary | ICD-10-CM

## 2018-12-26 DIAGNOSIS — M545 Low back pain: Secondary | ICD-10-CM

## 2018-12-26 DIAGNOSIS — G6289 Other specified polyneuropathies: Secondary | ICD-10-CM

## 2018-12-26 DIAGNOSIS — F330 Major depressive disorder, recurrent, mild: Secondary | ICD-10-CM

## 2018-12-26 LAB — POCT URINALYSIS DIPSTICK
Bilirubin, UA: NEGATIVE
Blood, UA: NEGATIVE
Glucose, UA: NEGATIVE
Ketones, UA: NEGATIVE
Leukocytes, UA: NEGATIVE
Nitrite, UA: NEGATIVE
Protein, UA: NEGATIVE
Spec Grav, UA: 1.02 (ref 1.010–1.025)
Urobilinogen, UA: 0.2 E.U./dL
pH, UA: 6 (ref 5.0–8.0)

## 2018-12-26 MED ORDER — FLUOXETINE HCL 20 MG PO TABS: 20.0000 mg | ORAL_TABLET | Freq: Every day | ORAL | 1 refills | Status: DC

## 2018-12-26 MED ORDER — GABAPENTIN 300 MG PO CAPS: 300.0000 mg | ORAL_CAPSULE | Freq: Three times a day (TID) | ORAL | 3 refills | Status: DC

## 2018-12-26 NOTE — Progress Notes (Signed)
OFFICE VISIT  12/26/2018   CC:  Chief Complaint  Patient presents with  . Follow-up    neuropathy, back pain, depression   HPI:    Patient is a 78 y.o. Caucasian female who presents for 1 mo f/u neuropathy, back pain, and MDD. At the time of last visit she had not started her fluoxetine yet and I referred her for counseling. Her back pain was responding some to mobic. Insurer had declined lyrica so we decided on a retry of gabapentin starting at a lower dose and plan for slow up-titration (past trial not tolerated, possibly due to starting at 300mg  dosing).  (Of note, she has DM, HLD, and is on lisinopril and has hypothyroidism managed by endocrinologist).  Labs done by her endo q 51mo and also gets f/u labs with oncology).  Interim hx:  Doing better, esp regarding her back pain.  Only occ RLB spasm. No radiating pain.  Neuropathy: tingly/numb feeling but no pain.  Gabapentin 300mg  bid helping. Forgets to take mid day dose most days.  No adverse side effects from med at this time.  She had a virtual visit with a counselor and pt was referred to the [facility] b/c of that counselor's concern about pt's alcohol drinking.  She has gone to one visit and plans on returning but she is skeptical about 12 step program. She is drinking less now: 2 glasses of wine most days.  Gabapentin helpful for this somewhat. She never got the fluoxetine--? Pharmacy or rx mix up. Says mood is a little better than when I talked to her last but she still wants trial of this med.  No SI or HI.  Last couple days with some dysuria, urgency, frequency, mild abnl odor but this is not new.  No prob with nausea or abd pain or flank pain or fever.  ROS: no CP, no SOB, no wheezing, no cough, no dizziness, no HAs, no rashes, no melena/hematochezia.  No polyuria or polydipsia.   EARS FEEL FULL X 1 mo.  +itchy in canals but no pain.  Past Medical History:  Diagnosis Date  . Anxiety and depression 1964    oncologist started duloxetine 12/2016  . Breast cancer ([place]) 03/14/2016   Clinical stage 2A: (triple neg): Right breast, upper inner quadrant, 03/2016.  Neoadjuvant chemo x 5 cycles,lumpectomy 4 mo later, then RT started 10/2016.  Adjuvant Xeloda [id number].  SWOG research trial pt 04/2017--pt randomized to pembrolizumab immunotherapy.  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  [name] Kitchen Cataracts, bilateral 07/2017  . 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 Depression 1964   Patient states since age 37  . Diabetes mellitus with complication ([place]) 8299   managed by endocrinology.  A1c Mar 12, 2018 was 7.0% at Dr. Shirlyn Goltz.   [name] Kitchen GERD (gastroesophageal reflux disease) 2013  . Hyperlipidemia 1986  . Hypertension 2008  . Hypothyroidism 1988   Diagnosed in her 59s.  Managed by Endocrinologist  . Osteoporosis 2015   pt states "osteopenia", but then says that she refused to take the rx med for this condition, so I suspect she had osteoporosis.  . Peripheral neuropathy 2017   Patient states diabetic neuropathy in feet prior to starting chemotherapy and then worsened by chemo.   [name] Kitchen TIA (transient ischemic attack) 03/26/2011    Past Surgical History:  Procedure Laterality Date  . ABDOMINAL HYSTERECTOMY  [place]  . BREAST ENHANCEMENT SURGERY  1982  . BREAST IMPLANT REMOVAL Right 09/13/2016   Procedure: REMOVAL RIGHT BREAST IMPLANT;  Surgeon: Irene Limbo, MD;  Location: [facility];  Service: Plastics;  Laterality: Right;  . BREAST LUMPECTOMY WITH RADIOACTIVE SEED AND SENTINEL LYMPH NODE BIOPSY Right 09/13/2016   Procedure: RIGHT BREAST LUMPECTOMY WITH RADIOACTIVE SEED X 2 AND SENTINEL LYMPH NODE BIOPSY;  Surgeon: Alphonsa Overall, MD;  Location: [facility];  Service: General;  Laterality: Right;  . BREAST SURGERY Right 03/14/2016    Biopsy  . CAPSULECTOMY Right 09/13/2016   Procedure: RIGHT CAPSULECTOMY;  Surgeon: Irene Limbo, MD;  Location: [facility];  Service: Plastics;  Laterality: Right;  . CATARACT EXTRACTION, BILATERAL Bilateral 08/10/17 right eye, 08/31/17 left eye  . MASS EXCISION Left 02/28/2018   Path: benign.  Procedure: EXCISIONLEFT MEDIAL THIGH MASS ERAS PATHWAY;  Surgeon: Erroll Luna, MD;  Location: [facility];  Service: General;  Laterality: Left;  . PORTACATH PLACEMENT N/A 03/15/2016   Procedure: INSERTION PORT-A-CATH WITH [place];  Surgeon: Alphonsa Overall, MD;  [facility];  Service: General;  Laterality: N/A;  . PORTACATH REMOVAL  07/2017  . surgical repair left ankle Left 2009   s/p Fall   . TONSILLECTOMY AND ADENOIDECTOMY  1948   Age 67    Outpatient Medications Prior to Visit  Medication Sig Dispense Refill  . atorvastatin (LIPITOR) 80 MG tablet Take 80 mg by mouth every evening.     [name] Kitchen levothyroxine (SYNTHROID, LEVOTHROID) 112 MCG tablet Take 1 tablet by mouth daily.    [name] Kitchen lisinopril (PRINIVIL,ZESTRIL) 20 MG tablet Take 20 mg by mouth every evening.     . meloxicam (MOBIC) 15 MG tablet Take 1 tablet (15 mg total) by mouth daily. 30 tablet 1  . metFORMIN (GLUCOPHAGE-XR) 500 MG 24 hr tablet Take 1,000 mg by mouth daily before supper.     . pyridoxine (B-6) 100 MG tablet Take 100 mg by mouth daily.     [name] Kitchen FLUoxetine (PROZAC) 20 MG tablet Take 1 tablet (20 mg total) by mouth daily. 30 tablet 0  . gabapentin (NEURONTIN) 100 MG capsule 1 tab po qhs.  Increase by 1 tab every 3 days up to max of 3 tabs tid 90 capsule 0  . traZODone (DESYREL) 50 MG tablet 1-3 tabs po qhs prn insomnia (Patient not taking: Reported on 12/26/2018) 60 tablet 1   No facility-administered medications prior to visit.     Allergies  Allergen Reactions  . Other Other (See Comments)    STEROIDS- emotional  . Penicillins Other (See Comments)    Unsure of reaction, was 78 years old    ROS As  per HPI  PE: Blood pressure 135/76, pulse 60, temperature 98 F (36.7 C), temperature source Temporal, resp. rate 18, height 5' 8.5" (1.74 m), weight 170 lb 6.4 oz (77.3 kg). Body mass index is 25.53 kg/m.  Gen: Alert, well appearing.  Patient is oriented to person, place, time, and situation. AFFECT: pleasant, lucid thought and speech. EARS: R EAC 75% blocked with hard cerumen that looks adherent to walls of canal.  TM is partially visualized and looks normal. L EAC 100% occluded with dry cerumen.  LABS:  Lab Results  Component Value Date   TSH 16.530 (H) 05/21/2018   Lab Results  Component Value Date   WBC 3.6 (L) 05/21/2018   HGB 13.8 05/21/2018   HCT 42.1 05/21/2018  MCV 99.3 05/21/2018   PLT 153 05/21/2018   Lab Results  Component Value Date   CREATININE 0.92 05/21/2018   BUN 18 05/21/2018   NA 139 05/21/2018   K 4.7 05/21/2018   CL 103 05/21/2018   CO2 26 05/21/2018   Lab Results  Component Value Date   ALT 45 (H) 05/21/2018   AST 38 05/21/2018   ALKPHOS 74 05/21/2018   BILITOT 1.4 (H) 05/21/2018   No results found for: CHOL No results found for: HDL No results found for: LDLCALC No results found for: TRIG No results found for: CHOLHDL  Lab Results  Component Value Date   HGBA1C 7.0 03/12/2018   POC dipstick UA today: ALL NORMAL  IMPRESSION AND PLAN:  1) Chronic low back pain w/out radiculopathy: improved. Taking meloxicam prn.  2) Peripheral neuropathy: improving on gabapentin 300mg  bid and she is tolerating med well.  She will focus on adding the mid day dose of 300 mg gabapentin.  3) MDD, recurrent. Alcohol abuse. She is seeing counselor at [facility], hopefully she'll continue to work on alc abuse. Will send fluoxetine 20mg  in again and verify with pharmacy that they received the rx.  4) Bilat cerumen impaction: Irrigated by nurse today: ->Left completely cleared, TM normal. Right partially cleared, with a good bit of cerumen remaining  adherent to canal walls. She'll continue debrox drops in R ear at home and we'll see if she needs irrigation again at f/u visit in 1 mo.  5) DM/HLD/hypoth: all managed by endo, pt gets adequate f/u labs per her report today. Will ask endo for their latest lab work.  6) Dysuria, mild.  Urgency and frequency mild. Urinalysis normal today. Will send for c/s.  Treat only if culture returns positive OR if she calls back saying sx's are worsening. No abx at this time, though.  An After Visit Summary was printed and given to the patient.  FOLLOW UP: Return in about 4 weeks (around 01/23/2019) for f/u mood.  Signed:  Crissie Sickles, MD           12/26/2018

## 2018-12-27 LAB — URINE CULTURE
MICRO NUMBER:: 692978
SPECIMEN QUALITY:: ADEQUATE

## 2019-01-16 ENCOUNTER — Other Ambulatory Visit: Payer: Self-pay

## 2019-01-17 ENCOUNTER — Encounter: Payer: Self-pay | Admitting: Obstetrics & Gynecology

## 2019-01-17 ENCOUNTER — Ambulatory Visit (INDEPENDENT_AMBULATORY_CARE_PROVIDER_SITE_OTHER): Payer: Medicare Other | Admitting: Obstetrics & Gynecology

## 2019-01-17 VITALS — BP 150/84 | Ht 67.0 in | Wt 169.0 lb

## 2019-01-17 DIAGNOSIS — Z853 Personal history of malignant neoplasm of breast: Secondary | ICD-10-CM

## 2019-01-17 DIAGNOSIS — Z780 Asymptomatic menopausal state: Secondary | ICD-10-CM

## 2019-01-17 DIAGNOSIS — Z9289 Personal history of other medical treatment: Secondary | ICD-10-CM | POA: Diagnosis not present

## 2019-01-17 DIAGNOSIS — Z90710 Acquired absence of both cervix and uterus: Secondary | ICD-10-CM

## 2019-01-17 DIAGNOSIS — Z13820 Encounter for screening for osteoporosis: Secondary | ICD-10-CM

## 2019-01-17 DIAGNOSIS — N952 Postmenopausal atrophic vaginitis: Secondary | ICD-10-CM | POA: Diagnosis not present

## 2019-01-17 DIAGNOSIS — Z01419 Encounter for gynecological examination (general) (routine) without abnormal findings: Secondary | ICD-10-CM

## 2019-01-17 DIAGNOSIS — Z171 Estrogen receptor negative status [ER-]: Secondary | ICD-10-CM | POA: Diagnosis not present

## 2019-01-17 DIAGNOSIS — N9089 Other specified noninflammatory disorders of vulva and perineum: Secondary | ICD-10-CM

## 2019-01-17 DIAGNOSIS — Z1272 Encounter for screening for malignant neoplasm of vagina: Secondary | ICD-10-CM

## 2019-01-17 DIAGNOSIS — C50211 Malignant neoplasm of upper-inner quadrant of right female breast: Secondary | ICD-10-CM

## 2019-01-17 NOTE — Progress Notes (Signed)
Marissa Hall [birth year] [id number]   History:    78 y.o. G1P1L1   RP: Established patient presenting for annual gyn exam   HPI: Status post total hysterectomy.  Postmenopausal, well on no hormone replacement therapy.  Complains of vaginal dryness with intercourse.  Persistent right vulvar lesion associated with burning.  No pelvic pain.  History of right breast cancer.  Body mass index 26.47.  Regular physical activities.  Health labs with family physician.  Past medical history,surgical history, family history and social history were all reviewed and documented in the EPIC chart.  Gynecologic History No LMP recorded. Patient has had a hysterectomy. Contraception: status post hysterectomy Last Pap: 2017. Results were: normal per patient Last mammogram: 03/2018. Results were: Benign Bone Density: Never Colonoscopy: 2011  Obstetric History OB History  Gravida Para Term Preterm AB Living  1 1       1   SAB TAB Ectopic Multiple Live Births               # Outcome Date GA Lbr Len/2nd Weight Sex Delivery Anes PTL Lv  1 Para              ROS: A ROS was performed and pertinent positives and negatives are included in the history.  GENERAL: No fevers or chills. HEENT: No change in vision, no earache, sore throat or sinus congestion. NECK: No pain or stiffness. CARDIOVASCULAR: No chest pain or pressure. No palpitations. PULMONARY: No shortness of breath, cough or wheeze. GASTROINTESTINAL: No abdominal pain, nausea, vomiting or diarrhea, melena or bright red blood per rectum. GENITOURINARY: No urinary frequency, urgency, hesitancy or dysuria. MUSCULOSKELETAL: No joint or muscle pain, no back pain, no recent trauma. DERMATOLOGIC: No rash, no itching, no lesions. ENDOCRINE: No polyuria, polydipsia, no heat or cold intolerance. No recent change in weight. HEMATOLOGICAL: No anemia or easy bruising or bleeding. NEUROLOGIC: No headache, seizures, numbness, tingling or weakness. PSYCHIATRIC: No  depression, no loss of interest in normal activity or change in sleep pattern.     Exam:   BP (!) 150/84   Ht 5\' 7"  (1.702 m)   Wt 169 lb (76.7 kg)   BMI 26.47 kg/m   Body mass index is 26.47 kg/m.  General appearance : Well developed well nourished female. No acute distress HEENT: Eyes: no retinal hemorrhage or exudates,  Neck supple, trachea midline, no carotid bruits, no thyroidmegaly Lungs: Clear to auscultation, no rhonchi or wheezes, or rib retractions  Heart: Regular rate and rhythm, no murmurs or gallops Breast:Examined in sitting and supine position were symmetrical in appearance, no palpable masses or tenderness,  no skin retraction, no nipple inversion, no nipple discharge, no skin discoloration, no axillary or supraclavicular lymphadenopathy Abdomen: no palpable masses or tenderness, no rebound or guarding Extremities: no edema or skin discoloration or tenderness  Pelvic: Vulva: Normal             Vagina: No gross lesions or discharge.  Pap reflex done.  Cervix/Uterus absent  Adnexa  Without masses or tenderness  Anus: Normal   Assessment/Plan:  78 y.o. female for annual exam   1. Encounter for Papanicolaou smear of vagina as part of routine gynecological examination Gynecologic exam status post total hysterectomy and menopause.  Pap reflex done on the vaginal vault.  Breast exam normal.  Screening mammogram October 2019 was benign.  Health labs with family physician.  Body mass index 26.47.  Continue with fitness and healthy nutrition.  2. S/P total hysterectomy  3.  Postmenopause Well on no hormone replacement therapy.  4. Postmenopausal atrophic vaginitis Given breast cancer, will use Cocconut oil with intercourse.  Can also use for vaginal dryness in small quantities when not sexually active.  5. Screening for osteoporosis Schedule bone density here now.  Vitamin D supplements, calcium intake of 1200 mg daily and regular weightbearing physical activities. -  DG Bone Density; Future  6. Vulval lesion Mild ulcer with exudate.  Rule out genital herpes, sure swab HSV done.  F/U Excision of vulvar lesion if not herpes. -Sureswab HSV  7. Malignant neoplasm of upper-inner quadrant of right breast in female, estrogen receptor negative ([place])  Counseling on above issues and coordination of care more than 50% for 20 minutes.  Princess Bruins MD, 9:23 AM 01/17/2019

## 2019-01-18 LAB — PAP THINPREP ASCUS RFLX HPV RFLX TYPE
C. trachomatis RNA, TMA: NOT DETECTED
N. gonorrhoeae RNA, TMA: NOT DETECTED

## 2019-01-20 ENCOUNTER — Encounter: Payer: Self-pay | Admitting: Obstetrics & Gynecology

## 2019-01-20 NOTE — Patient Instructions (Signed)
1. Encounter for Papanicolaou smear of vagina as part of routine gynecological examination Gynecologic exam status post total hysterectomy and menopause.  Pap reflex done on the vaginal vault.  Breast exam normal.  Screening mammogram October 2019 was benign.  Health labs with family physician.  Body mass index 26.47.  Continue with fitness and healthy nutrition.  2. S/P total hysterectomy  3. Postmenopause Well on no hormone replacement therapy.  4. Postmenopausal atrophic vaginitis Given breast cancer, will use Cocconut oil with intercourse.  Can also use for vaginal dryness in small quantities when not sexually active.  5. Screening for osteoporosis Schedule bone density here now.  Vitamin D supplements, calcium intake of 1200 mg daily and regular weightbearing physical activities. - DG Bone Density; Future  6. Vulval lesion Mild ulcer with exudate.  Rule out genital herpes, sure swab HSV done.  F/U Excision of vulvar lesion if not herpes. -Sureswab HSV  7. Malignant neoplasm of upper-inner quadrant of right breast in female, estrogen receptor negative ([place])  Patricia, it was a pleasure seeing you today!  I will inform you of your results as soon as they are available.

## 2019-01-21 ENCOUNTER — Encounter: Payer: Self-pay | Admitting: *Deleted

## 2019-01-21 LAB — SURESWAB HSV, TYPE 1/2 DNA, PCR
HSV 1 DNA: NOT DETECTED
HSV 2 DNA: NOT DETECTED

## 2019-01-23 ENCOUNTER — Other Ambulatory Visit: Payer: Self-pay

## 2019-01-23 ENCOUNTER — Ambulatory Visit (INDEPENDENT_AMBULATORY_CARE_PROVIDER_SITE_OTHER): Payer: Medicare Other | Admitting: Family Medicine

## 2019-01-23 ENCOUNTER — Encounter: Payer: Self-pay | Admitting: Family Medicine

## 2019-01-23 VITALS — Wt 169.0 lb

## 2019-01-23 DIAGNOSIS — F339 Major depressive disorder, recurrent, unspecified: Secondary | ICD-10-CM

## 2019-01-23 DIAGNOSIS — E1142 Type 2 diabetes mellitus with diabetic polyneuropathy: Secondary | ICD-10-CM | POA: Diagnosis not present

## 2019-01-23 NOTE — Progress Notes (Signed)
Virtual Visit via Video Note  I connected with pt on 01/23/19 at  3:30 PM EDT by a video enabled telemedicine application and verified that I am speaking with the correct person using two identifiers.  Location patient: home Location provider:work or home office Persons participating in the virtual visit: patient, provider  I discussed the limitations of evaluation and management by telemedicine and the availability of in person appointments. The patient expressed understanding and agreed to proceed.  Telemedicine visit is a necessity given the COVID-19 restrictions in place at the current time.  HPI: 78 y/o WF being seen today for 1 mo f/u recurrent MDD. Started fluoxetine 20mg  last visit.  She had just started seeing a counselor at that time. Also, [facility] for her alc abuse. Her periph neurop tingling/numbness (no pain) was improving on 300mg  neurontin bid.  She was going to try to get on tid schedule of same dose.  (Of note, she has DM, HLD, and is on lisinopril and has hypothyroidism managed by endocrinologist).  Labs done by her endo q 28mo and also gets f/u labs with oncology).  Interim hx:  Feeling better from psych standpoint, didn't start fluox b/c of package insert warnings about possible hyperglycemia. She is going to hold off on starting it but knows she can start it in future if persistent low/dep mood returns. She states that financial worries are her biggest extraneous factor making her anxious and depressed in the last year. She sees her counselor at [facility] this afternoon. Pt states she has cut back on alcohol a lot.  No more wine at all.  Has 1-2 mixed drinks on some nights. Not self medicating with alc anymore.  Says gabapentin tid caused blurry vision, so she cut back to her 300mg  bid dosing and blurry vision not occurring anymore.  Says sx's of LL numbness/tingling is improved on this dosing so she is happy staying at this dose.  ROS: no CP, no SOB, no  wheezing, no cough, no dizziness, no HAs, no rashes, no melena/hematochezia.  No polyuria or polydipsia.    Past Medical History:  Diagnosis Date  . Anxiety and depression 1964   oncologist started duloxetine 12/2016  . Breast cancer ([place]) 03/14/2016   Clinical stage 2A: (triple neg): Right breast, upper inner quadrant, 03/2016.  Neoadjuvant chemo x 5 cycles,lumpectomy 4 mo later, then RT started 10/2016.  Adjuvant Xeloda [id number].  SWOG research trial pt 04/2017--pt randomized to pembrolizumab immunotherapy.  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  [name] Kitchen Cataracts, bilateral 07/2017  . 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 Depression 1964   Patient states since age 65  . Diabetes mellitus with complication ([place]) 2423   managed by endocrinology.  A1c Mar 12, 2018 was 7.0% at Dr. Shirlyn Goltz.   [name] Kitchen GERD (gastroesophageal reflux disease) 2013  . Hyperlipidemia 1986  . Hypertension 2008  . Hypothyroidism 1988   Diagnosed in her 55s.  Managed by Endocrinologist  . Osteoporosis 2015   pt states "osteopenia", but then says that she refused to take the rx med for this condition, so I suspect she had osteoporosis.  . Peripheral neuropathy 2017   Patient states diabetic neuropathy in feet prior to starting chemotherapy and then worsened by chemo.   [name] Kitchen TIA (transient ischemic attack) 03/26/2011    Past Surgical History:  Procedure Laterality Date  . ABDOMINAL  HYSTERECTOMY  1972  . APPENDECTOMY  1972  . BREAST ENHANCEMENT SURGERY  1982  . BREAST IMPLANT REMOVAL Right 09/13/2016   Procedure: REMOVAL RIGHT BREAST IMPLANT;  Surgeon: Irene Limbo, MD;  Location: [facility];  Service: Plastics;  Laterality: Right;  . BREAST LUMPECTOMY WITH RADIOACTIVE SEED AND SENTINEL LYMPH NODE BIOPSY Right 09/13/2016   Procedure: RIGHT BREAST LUMPECTOMY WITH RADIOACTIVE  SEED X 2 AND SENTINEL LYMPH NODE BIOPSY;  Surgeon: Alphonsa Overall, MD;  Location: [facility];  Service: General;  Laterality: Right;  . BREAST SURGERY Right 03/14/2016   Biopsy  . CAPSULECTOMY Right 09/13/2016   Procedure: RIGHT CAPSULECTOMY;  Surgeon: Irene Limbo, MD;  Location: [facility];  Service: Plastics;  Laterality: Right;  . CATARACT EXTRACTION, BILATERAL Bilateral 08/10/17 right eye, 08/31/17 left eye  . MASS EXCISION Left 02/28/2018   Path: benign.  Procedure: EXCISIONLEFT MEDIAL THIGH MASS ERAS PATHWAY;  Surgeon: Erroll Luna, MD;  Location: [facility];  Service: General;  Laterality: Left;  . PORTACATH PLACEMENT N/A 03/15/2016   Procedure: INSERTION PORT-A-CATH WITH [place];  Surgeon: Alphonsa Overall, MD;  Location: [facility];  Service: General;  Laterality: N/A;  . PORTACATH REMOVAL  07/2017  . surgical repair left ankle Left 2009   s/p Fall   . TONSILLECTOMY AND ADENOIDECTOMY  1948   Age 67    Family History  Problem Relation Age of Onset  . Stroke Mother   . Suicidality Father   . Stroke Brother   . Stroke Son     SOCIAL HX:  Social History   Socioeconomic History  . Marital status: Divorced    Spouse name: Not on file  . Number of children: Not on file  . Years of education: Not on file  . Highest education level: Not on file  Occupational History  . Not on file  Social Needs  . Financial resource strain: Not on file  . Food insecurity    Worry: Not on file    Inability: Not on file  . Transportation needs    Medical: Not on file    Non-medical: Not on file  Tobacco Use  . Smoking status: Former Smoker    Packs/day: 1.00    Types: Cigarettes    Quit date: 03/08/1999    Years since quitting: 19.8  . Smokeless tobacco: Never Used  Substance and Sexual Activity  . Alcohol use: Yes    Alcohol/week: 7.0 standard drinks    Types: 7 Shots of liquor per week    Comment:  daily  . Drug use: No  . Sexual activity: Yes     Partners: Male    Comment: 1st intercourse- 43, partners- 10+, current partner- 8 yrs  Lifestyle  . Physical activity    Days per week: Not on file    Minutes per session: Not on file  . Stress: Not on file  Relationships  . Social Herbalist on phone: Not on file    Gets together: Not on file    Attends religious service: Not on file    Active member of club or organization: Not on file    Attends meetings of clubs or organizations: Not on file    Relationship status: Not on file  Other Topics Concern  . Not on file  Social History Narrative   Divorced, has a son who lives in [place].   Educ: college   Occup: Optometrist, still working part time.  Tob: quit 2000.     Alc: several times a week--1-2 glasses wine.   No drugs.      Current Outpatient Medications:  .  atorvastatin (LIPITOR) 80 MG tablet, Take 80 mg by mouth every evening. , Disp: , Rfl:  .  gabapentin (NEURONTIN) 300 MG capsule, Take 1 capsule (300 mg total) by mouth 3 (three) times daily., Disp: 90 capsule, Rfl: 3 .  levothyroxine (SYNTHROID, LEVOTHROID) 112 MCG tablet, Take 1 tablet by mouth daily., Disp: , Rfl:  .  lisinopril (PRINIVIL,ZESTRIL) 20 MG tablet, Take 20 mg by mouth every evening. , Disp: , Rfl:  .  metFORMIN (GLUCOPHAGE-XR) 500 MG 24 hr tablet, Take 1,000 mg by mouth daily before supper. , Disp: , Rfl:  .  pyridoxine (B-6) 100 MG tablet, Take 100 mg by mouth daily. , Disp: , Rfl:  .  FLUoxetine (PROZAC) 20 MG tablet, Take 1 tablet (20 mg total) by mouth daily. (Patient not taking: Reported on 01/17/2019), Disp: 30 tablet, Rfl: 1  EXAM:  VITALS per patient if applicable: Wt 169 lb (76.7 kg)   BMI 26.47 kg/m    GENERAL: alert, oriented, appears well and in no acute distress  HEENT: atraumatic, conjunttiva clear, no obvious abnormalities on inspection of external nose and ears  NECK: normal movements of the head and neck  LUNGS: on inspection no signs of respiratory distress,  breathing rate appears normal, no obvious gross SOB, gasping or wheezing  CV: no obvious cyanosis  MS: moves all visible extremities without noticeable abnormality  PSYCH/NEURO: pleasant and cooperative, no obvious depression or anxiety, speech and thought processing grossly intact  LABS: none today    Chemistry      Component Value Date/Time   NA 139 05/21/2018 0918   NA 136 05/01/2017 0914   K 4.7 05/21/2018 0918   K 4.5 05/01/2017 0914   CL 103 05/21/2018 0918   CO2 26 05/21/2018 0918   CO2 24 05/01/2017 0914   BUN 18 05/21/2018 0918   BUN 14.9 05/01/2017 0914   CREATININE 0.92 05/21/2018 0918   CREATININE 0.8 05/01/2017 0914      Component Value Date/Time   CALCIUM 9.9 05/21/2018 0918   CALCIUM 9.6 05/01/2017 0914   ALKPHOS 74 05/21/2018 0918   ALKPHOS 75 05/01/2017 0914   AST 38 05/21/2018 0918   AST 51 (H) 05/01/2017 0914   ALT 45 (H) 05/21/2018 0918   ALT 62 (H) 05/01/2017 0914   BILITOT 1.4 (H) 05/21/2018 0918   BILITOT 1.30 (H) 05/01/2017 0914      ASSESSMENT AND PLAN:  Discussed the following assessment and plan:  1) Recurrent MDD: seems to be coming out of it spontaneously. She does have fluoxetine on hand to start if needed.  She will call and let me know if she does end up having to start it.  Reassured her that it should not cause any significant hyperglycemia.  Continue cutting back on alcohol and continue counseling with [facility].  2) Periph neuropathy (DPN worsened/complicated by chemotherapy for breast ca). Doing fine on gabapentin 300 mg bid (blurry vision on 300mg  tid dosing).  I discussed the assessment and treatment plan with the patient. The patient was provided an opportunity to ask questions and all were answered. The patient agreed with the plan and demonstrated an understanding of the instructions.   The patient was advised to call back or seek an in-person evaluation if the symptoms worsen or if the condition fails to improve as  anticipated.  F/u: 3 mo  Signed:  Crissie Sickles, MD           01/23/2019

## 2019-01-29 ENCOUNTER — Other Ambulatory Visit: Payer: Self-pay

## 2019-01-30 ENCOUNTER — Ambulatory Visit (INDEPENDENT_AMBULATORY_CARE_PROVIDER_SITE_OTHER): Payer: Medicare Other

## 2019-01-30 ENCOUNTER — Other Ambulatory Visit: Payer: Self-pay | Admitting: Obstetrics & Gynecology

## 2019-01-30 DIAGNOSIS — Z13820 Encounter for screening for osteoporosis: Secondary | ICD-10-CM

## 2019-01-30 DIAGNOSIS — M8589 Other specified disorders of bone density and structure, multiple sites: Secondary | ICD-10-CM | POA: Diagnosis not present

## 2019-01-30 DIAGNOSIS — Z780 Asymptomatic menopausal state: Secondary | ICD-10-CM | POA: Diagnosis not present

## 2019-02-05 ENCOUNTER — Other Ambulatory Visit: Payer: Self-pay

## 2019-02-06 ENCOUNTER — Ambulatory Visit (INDEPENDENT_AMBULATORY_CARE_PROVIDER_SITE_OTHER): Payer: Medicare Other | Admitting: Obstetrics & Gynecology

## 2019-02-06 ENCOUNTER — Encounter: Payer: Self-pay | Admitting: Obstetrics & Gynecology

## 2019-02-06 VITALS — BP 126/78

## 2019-02-06 DIAGNOSIS — N907 Vulvar cyst: Secondary | ICD-10-CM | POA: Diagnosis not present

## 2019-02-06 NOTE — Patient Instructions (Signed)
1. Epidermoid cyst of skin of vulva Chronic epidermoid cyst of the vulva.  Excision done.  Specimen sent to pathology.  Postprocedure precautions reviewed with patient.  Marissa Hall, it was a pleasure seeing you today!  I will inform you of your results as soon as they are available.

## 2019-02-06 NOTE — Progress Notes (Signed)
    Marissa Hall January 12, 1941 [id number]        78 y.o.  G1P1L1  RP: Right vulvar lesion  HPI: Right vulvar lesion x many years.  No change, no drainage.  Uncomfortable because it is right in the intertriginous area between the leg and the vulva.  HSV 1-2 negative last visit.   OB History  Gravida Para Term Preterm AB Living  1 1       1   SAB TAB Ectopic Multiple Live Births               # Outcome Date GA Lbr Len/2nd Weight Sex Delivery Anes PTL Lv  1 Para             Past medical history,surgical history, problem list, medications, allergies, family history and social history were all reviewed and documented in the EPIC chart.   Directed ROS with pertinent positives and negatives documented in the history of present illness/assessment and plan.  Exam:  Vitals:   02/06/19 1126  BP: 126/78   General appearance:  Normal  Gynecologic exam: Vulva:  Right vulvar lesion in the intertriginous area between the right thigh and the vulva.  1 cm in diameter with a black head, probable epidermoid cyst.  Betadine prep.  Local anesthesia with Lidocaine 1%.  Attempted drainage without success.  Therefore, excision of lesion with scalpel.  Closed with 3 separate stitches of Vicryl 4-0.  Good hemostasis.   Assessment/Plan:  78 y.o. G1P1   1. Epidermoid cyst of skin of vulva Chronic epidermoid cyst of the vulva.  Excision done.  Specimen sent to pathology.  Postprocedure precautions reviewed with patient.  Princess Bruins MD, 12:18 PM 02/06/2019

## 2019-02-08 LAB — TISSUE SPECIMEN

## 2019-02-08 LAB — PATHOLOGY REPORT

## 2019-02-19 ENCOUNTER — Encounter: Payer: Self-pay | Admitting: Obstetrics & Gynecology

## 2019-03-01 ENCOUNTER — Other Ambulatory Visit: Payer: Self-pay

## 2019-03-01 ENCOUNTER — Telehealth: Payer: Self-pay | Admitting: Family Medicine

## 2019-03-01 NOTE — Telephone Encounter (Signed)
Patient reports her back is hurting, feel pain all the wall down to her leg  Have not gone to PT in awhile.  Refill of meds? Mobic?  Or does Dr. Anitra Lauth need to see her again to reaccess her?   Please look at schedule next week to see if we can work her in on his schedule, if appt is needed. Thank you/dnh

## 2019-03-01 NOTE — Telephone Encounter (Signed)
Pt was taking Meloxicam but not prescribed you. Her last follow up with you regarding this issue was 12/26/18. She is also taking Gabapentin at a lower dose, 300mg  tid qd.   Please advise, thanks.

## 2019-03-03 MED ORDER — MELOXICAM 15 MG PO TBDP: 1.0000 | ORAL_TABLET | Freq: Every day | ORAL | 1 refills | Status: DC | PRN

## 2019-03-03 NOTE — Telephone Encounter (Signed)
I eRx'd mobic. Needs o/v for eval of back pain. I cannot work her in.

## 2019-03-04 ENCOUNTER — Telehealth: Payer: Self-pay | Admitting: Family Medicine

## 2019-03-04 NOTE — Telephone Encounter (Signed)
Patient advised, she states Mobic is not helping.  Patient scheduled to discuss back pain with DR McGowen 03/08/19.

## 2019-03-04 NOTE — Telephone Encounter (Signed)
The Meloxican Rx that was sent is not available. Please call [facility] at [phone].

## 2019-03-04 NOTE — Telephone Encounter (Signed)
Pharmacy states that the RX was showing ODT, confirmed with Dr Anitra Lauth that it was supposed to be regular Meloxicam.  Pharmacy advised and will change.

## 2019-03-08 ENCOUNTER — Ambulatory Visit: Payer: Medicare Other | Admitting: Family Medicine

## 2019-03-08 DIAGNOSIS — E1165 Type 2 diabetes mellitus with hyperglycemia: Secondary | ICD-10-CM | POA: Diagnosis not present

## 2019-03-08 DIAGNOSIS — E1140 Type 2 diabetes mellitus with diabetic neuropathy, unspecified: Secondary | ICD-10-CM | POA: Diagnosis not present

## 2019-03-10 ENCOUNTER — Encounter: Payer: Self-pay | Admitting: Family Medicine

## 2019-03-13 ENCOUNTER — Encounter: Payer: Self-pay | Admitting: Family Medicine

## 2019-03-13 ENCOUNTER — Ambulatory Visit (INDEPENDENT_AMBULATORY_CARE_PROVIDER_SITE_OTHER): Payer: Medicare Other | Admitting: Family Medicine

## 2019-03-13 ENCOUNTER — Other Ambulatory Visit: Payer: Self-pay

## 2019-03-13 VITALS — BP 166/87 | HR 62 | Temp 98.3°F | Resp 16 | Ht 67.0 in | Wt 159.5 lb

## 2019-03-13 DIAGNOSIS — M545 Low back pain, unspecified: Secondary | ICD-10-CM

## 2019-03-13 DIAGNOSIS — E1140 Type 2 diabetes mellitus with diabetic neuropathy, unspecified: Secondary | ICD-10-CM | POA: Diagnosis not present

## 2019-03-13 DIAGNOSIS — E1159 Type 2 diabetes mellitus with other circulatory complications: Secondary | ICD-10-CM | POA: Diagnosis not present

## 2019-03-13 DIAGNOSIS — E039 Hypothyroidism, unspecified: Secondary | ICD-10-CM | POA: Diagnosis not present

## 2019-03-13 DIAGNOSIS — Z23 Encounter for immunization: Secondary | ICD-10-CM

## 2019-03-13 DIAGNOSIS — M7918 Myalgia, other site: Secondary | ICD-10-CM

## 2019-03-13 DIAGNOSIS — I10 Essential (primary) hypertension: Secondary | ICD-10-CM | POA: Diagnosis not present

## 2019-03-13 DIAGNOSIS — M25551 Pain in right hip: Secondary | ICD-10-CM

## 2019-03-13 DIAGNOSIS — G8929 Other chronic pain: Secondary | ICD-10-CM

## 2019-03-13 DIAGNOSIS — E1169 Type 2 diabetes mellitus with other specified complication: Secondary | ICD-10-CM | POA: Diagnosis not present

## 2019-03-13 DIAGNOSIS — E785 Hyperlipidemia, unspecified: Secondary | ICD-10-CM | POA: Diagnosis not present

## 2019-03-13 DIAGNOSIS — E1165 Type 2 diabetes mellitus with hyperglycemia: Secondary | ICD-10-CM | POA: Diagnosis not present

## 2019-03-13 MED ORDER — LISINOPRIL 30 MG PO TABS: 30.0000 mg | ORAL_TABLET | Freq: Every day | ORAL | 0 refills | Status: DC

## 2019-03-13 NOTE — Progress Notes (Signed)
OFFICE VISIT  03/13/2019   CC:  Chief Complaint  Patient presents with  . Back Pain    comes and goes. not able to sleep. she cannot sit very long. right lower hip and back that hurts. goes down into the knee   HPI:    Patient is a 78 y.o. Caucasian female who presents for right hip, ? R low back pain. Off and on for the last 1 yr, R LB pain and RIGHT GLUT pain involving varying areas of R lateral thigh that is constant but waxes and wanes in intensity.  Takes 2 motrin qd to bid, helps a little.  Hurts sitting and lying on the right side.  R glut ache/pain, extends down lateral R thigh to the level of the knee.  No tingling or numbness in this region. Has gone through PT x 2, dry needling as well.  No back or hip x-ray has been done.  She does not have a bp cuff for home bp monitoring. Feeling fatigued, but says this is "always how I feel".  ROS: no CP, no SOB, no wheezing, no cough, no dizziness, no HAs, no rashes, no melena/hematochezia.  No polyuria or polydipsia.  No myalgias or arthralgias other than the ones mentioned in HPI.   Past Medical History:  Diagnosis Date  . Anxiety and depression 1964   oncologist started duloxetine 12/2016  . Breast cancer ([facility]) 03/14/2016   Clinical stage 2A: (triple neg): Right breast, upper inner quadrant, 03/2016.  Neoadjuvant chemo x 5 cycles,lumpectomy 4 mo later, then RT started 10/2016.  Adjuvant Xeloda [id number].  SWOG research trial pt 04/2017--pt randomized to pembrolizumab immunotherapy.  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  [name] Kitchen Cataracts, bilateral 07/2017  . Chemotherapy-induced neuropathy ([facility]) 07/04/2016   feet; responding well to cymbalta  . Depression 1964   Patient states since age 78  . Diabetes mellitus with complication ([facility]) [id number]   managed by endocrinology.  A1c Mar 12, 2018 was 7.0% at Dr. Shirlyn Goltz.   [name] Kitchen Epidermoid cyst of vulva     Chronic epidermoid cyst of the vulva.  Excision done 02/2019  . GERD (gastroesophageal reflux disease) 2013  . Hyperlipidemia 1986  . Hypertension 2008  . Hypothyroidism 1988   Diagnosed in her 22s.  Managed by Endocrinologist  . Osteoporosis 2015   pt states "osteopenia", but then says that she refused to take the rx med for this condition, so I suspect she had osteoporosis.  . Peripheral neuropathy 2017   Patient states diabetic neuropathy in feet prior to starting chemotherapy and then worsened by chemo.   [name] Kitchen TIA (transient ischemic attack) 03/26/2011    Past Surgical History:  Procedure Laterality Date  . ABDOMINAL HYSTERECTOMY  1972  . APPENDECTOMY  1972  . BREAST ENHANCEMENT SURGERY  1982  . BREAST IMPLANT REMOVAL Right 09/13/2016   Procedure: REMOVAL RIGHT BREAST IMPLANT;  Surgeon: Irene Limbo, MD;  Location: [facility];  Service: Plastics;  Laterality: Right;  . BREAST LUMPECTOMY WITH RADIOACTIVE SEED AND SENTINEL LYMPH NODE BIOPSY Right 09/13/2016   Procedure: RIGHT BREAST LUMPECTOMY WITH RADIOACTIVE SEED X 2 AND SENTINEL LYMPH NODE BIOPSY;  Surgeon: Alphonsa Overall, MD;  Location: [facility];  Service: General;  Laterality: Right;  . BREAST SURGERY Right 03/14/2016   Biopsy  . CAPSULECTOMY Right 09/13/2016   Procedure: RIGHT CAPSULECTOMY;  Surgeon: Irene Limbo, MD;  Location:  [facility];  Service: Plastics;  Laterality: Right;  . CATARACT EXTRACTION, BILATERAL Bilateral 08/10/17 right eye, 08/31/17 left eye  . MASS EXCISION Left 02/28/2018   Path: benign.  Procedure: EXCISIONLEFT MEDIAL THIGH MASS ERAS PATHWAY;  Surgeon: Erroll Luna, MD;  Location: [facility];  Service: General;  Laterality: Left;  . PORTACATH PLACEMENT N/A 03/15/2016   Procedure: INSERTION PORT-A-CATH WITH [place];  Surgeon: Alphonsa Overall, MD;  Location: [facility];  Service: General;  Laterality: N/A;  . PORTACATH REMOVAL  07/2017  . surgical repair left  ankle Left 2009   s/p Fall   . TONSILLECTOMY AND ADENOIDECTOMY  1948   Age 67    Outpatient Medications Prior to Visit  Medication Sig Dispense Refill  . atorvastatin (LIPITOR) 80 MG tablet Take 80 mg by mouth every evening.     . Cholecalciferol (VITAMIN D3) 125 MCG (5000 UT) TABS Take by mouth.    . gabapentin (NEURONTIN) 300 MG capsule Take 1 capsule (300 mg total) by mouth 3 (three) times daily. 90 capsule 3  . levothyroxine (SYNTHROID, LEVOTHROID) 112 MCG tablet Take 1 tablet by mouth daily.    . metFORMIN (GLUCOPHAGE-XR) 500 MG 24 hr tablet Take 1,000 mg by mouth daily before supper.     . pyridoxine (B-6) 100 MG tablet Take 100 mg by mouth daily.     [name] Kitchen lisinopril (PRINIVIL,ZESTRIL) 20 MG tablet Take 20 mg by mouth every evening.     . Meloxicam 15 MG TBDP Take 1 tablet by mouth daily as needed. (Patient not taking: Reported on 03/13/2019) 30 tablet 1   No facility-administered medications prior to visit.     Allergies  Allergen Reactions  . Other Other (See Comments)    STEROIDS- emotional  . Penicillins Other (See Comments)    Unsure of reaction, was 78 years old    ROS As per HPI  PE: Initial bp here today was 166/87 with automated cuff.  Repeat bp with manual bp cuff was 172/86. Blood pressure (!) 166/87, pulse 62, temperature 98.3 F (36.8 C), temperature source Temporal, resp. rate 16, height 5\' 7"  (1.702 m), weight 159 lb 8 oz (72.3 kg), SpO2 97 %. Body mass index is 24.98 kg/m.  Exam chaperoned by Deveron Furlong, CMA. Gets out of chair fine, gait is normal. Gets up onto exam table fine.  She has no LB tenderness.  Some limitation of forward flexion due to pain in R glut. She has pretty focal tenderness in posterolateral R glut but not over or near the ischial tuberosity.   No tenderness over greater troch or thigh.  LE strength 5/5 prox/dist bilat.  Patellar DTRs 2+ bilat, trace achilles DTRs bilat.  SLR neg bilat.  FABER maneuver did not elicit any glut or hip  pain. Resisted hip extension and flexion did not elicit pain.  LABS:    Chemistry      Component Value Date/Time   NA 139 05/21/2018 0918   NA 136 05/01/2017 0914   K 4.7 05/21/2018 0918   K 4.5 05/01/2017 0914   CL 103 05/21/2018 0918   CO2 26 05/21/2018 0918   CO2 24 05/01/2017 0914   BUN 18 05/21/2018 0918   BUN 14.9 05/01/2017 0914   CREATININE 0.92 05/21/2018 0918   CREATININE 0.8 05/01/2017 0914      Component Value Date/Time   CALCIUM 9.9 05/21/2018 0918   CALCIUM 9.6 05/01/2017 0914   ALKPHOS 74 05/21/2018 0918   ALKPHOS 75 05/01/2017 0914  AST 38 05/21/2018 0918   AST 51 (H) 05/01/2017 0914   ALT 45 (H) 05/21/2018 0918   ALT 62 (H) 05/01/2017 0914   BILITOT 1.4 (H) 05/21/2018 0918   BILITOT 1.30 (H) 05/01/2017 0914     Lab Results  Component Value Date   WBC 3.6 (L) 05/21/2018   HGB 13.8 05/21/2018   HCT 42.1 05/21/2018   MCV 99.3 05/21/2018   PLT 153 05/21/2018    IMPRESSION AND PLAN:  1) Chronic right LB, R glut, R thigh pain. Everything seems to point to gluteal pain, likely tendonitis. Recommended continue 2 ibup qd to bid with food. Home stretching. Ordered plain films of L spine and R hip. Refer to sports med for further evaluation and management.  2) HTN, poor control. Increase lisinopril to 30mg  qd today. Buy bp cuff for home monitoring. Write down daily bp and hr for the next 2 wks and we'll review them at telemed f/u in 2 wks.  I neglected to get labs on her today, so I'll ask her to return at her convenience for these (CMET, TSH, FLP). She sees Dr. Hartford Poli, endocrinologist, for her DM/Hypoth but he has not checked TSH in a while per his recent note.  An After Visit Summary was printed and given to the patient.  FOLLOW UP: Return in about 2 weeks (around 03/27/2019) for telemedicine f/u HTN.  Signed:  Crissie Sickles, MD           03/13/2019

## 2019-03-27 ENCOUNTER — Ambulatory Visit: Payer: Medicare Other | Admitting: Family Medicine

## 2019-04-02 ENCOUNTER — Ambulatory Visit (INDEPENDENT_AMBULATORY_CARE_PROVIDER_SITE_OTHER): Payer: Medicare Other | Admitting: Family Medicine

## 2019-04-02 ENCOUNTER — Other Ambulatory Visit: Payer: Self-pay

## 2019-04-02 ENCOUNTER — Ambulatory Visit (INDEPENDENT_AMBULATORY_CARE_PROVIDER_SITE_OTHER)
Admission: RE | Admit: 2019-04-02 | Discharge: 2019-04-02 | Disposition: A | Discharge status: Home / Self Care | Payer: Medicare Other | Source: Ambulatory Visit | Attending: Family Medicine | Admitting: Family Medicine

## 2019-04-02 VITALS — BP 118/84 | HR 62

## 2019-04-02 DIAGNOSIS — G8929 Other chronic pain: Secondary | ICD-10-CM | POA: Diagnosis not present

## 2019-04-02 DIAGNOSIS — M47816 Spondylosis without myelopathy or radiculopathy, lumbar region: Secondary | ICD-10-CM | POA: Diagnosis not present

## 2019-04-02 DIAGNOSIS — M25561 Pain in right knee: Secondary | ICD-10-CM

## 2019-04-02 DIAGNOSIS — M5416 Radiculopathy, lumbar region: Secondary | ICD-10-CM | POA: Diagnosis not present

## 2019-04-02 DIAGNOSIS — M545 Low back pain, unspecified: Secondary | ICD-10-CM

## 2019-04-02 MED ORDER — NAPROXEN 500 MG PO TABS: 500.0000 mg | ORAL_TABLET | Freq: Two times a day (BID) | ORAL | 3 refills | Status: DC

## 2019-04-02 NOTE — Assessment & Plan Note (Signed)
Patient has more of a lumbar radiculopathy.  We discussed with patient in great length about icing regimen, home exercise, what activities to doing which wants to avoid.  Discussed with patient that I do feel that there is a chance also secondary to patient's history of cancer we will get x-rays.  Patient has had this pain for greater than 1 year intermittently.  Seems to be worsening over the course of time and advanced imaging may be warranted.  Change patient's anti-inflammatory and warned of potential side effects such as GI disturbances.  Patient will follow-up with me again 4 to 8 weeks if worsening symptoms secondary to patient's history of cancer will consider MRI

## 2019-04-02 NOTE — Patient Instructions (Addendum)
Good to see you  Ice 20 minutes 2 times daily. Usually after activity and before bed. Exercises 3 times a week.  Naproxen 2 times a day stp aleve Continue the other vitamins Vitamin D increase to 2000IU daily  Turmeric 500mg  daily  Tart cherry extract 1200mg  at night Puritan pride or Natures made are good brands See me again in 4 weeks but if not better in 2 weeks then give me a call and we will consider MRI

## 2019-04-02 NOTE — Progress Notes (Signed)
[facility] [address], [place] [address] Phone: [phone] Subjective:   [name], am serving as a scribe for Dr. Hulan Saas.  CC: Back pain  [id number]  Marissa Hall is a 78 y.o. female coming in with complaint of back pain. Patient states that she is having right hip in her glute that is achy and sharp knee pain as well. Pain increases with lumbar flexion. Has not been able to sleep due to pain. Did try meloxicam. Uses Advil instead. Pain has been occurring for one year following a move. Has tried PT last year which did help improve her pain. Has been doing some stretches. Is using 300 mg of gabapentin at night.      Past Medical History:  Diagnosis Date   Anxiety and depression 1964   oncologist started duloxetine 12/2016   Breast cancer ([facility]) 03/14/2016   Clinical stage 2A: (triple neg): Right breast, upper inner quadrant, 03/2016.  Neoadjuvant chemo x 5 cycles,lumpectomy 4 mo later, then RT started 10/2016.  Adjuvant Xeloda [id number].  SWOG research trial pt 04/2017--pt randomized to pembrolizumab immunotherapy.  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  A1c Mar 12, 2018 was 7.0% at Dr. Shirlyn Goltz.    Epidermoid cyst of vulva    Chronic epidermoid cyst of the vulva.  Excision done 02/2019   GERD (gastroesophageal reflux disease) 2013   Hyperlipidemia 1986   Hypertension 2008   Hypothyroidism 1988   Diagnosed in her 51s.  Managed by Endocrinologist   Osteoporosis 2015   pt states "osteopenia", but then says that she refused to take the rx med for this condition, so I suspect she had  osteoporosis.   Peripheral neuropathy 2017   Patient states diabetic neuropathy in feet prior to starting chemotherapy and then worsened by chemo.    TIA (transient ischemic attack) 03/26/2011   Past Surgical History:  Procedure Laterality Date   ABDOMINAL HYSTERECTOMY  1972   APPENDECTOMY  1972   BREAST ENHANCEMENT SURGERY  1982   BREAST IMPLANT REMOVAL Right 09/13/2016   Procedure: REMOVAL RIGHT BREAST IMPLANT;  Surgeon: Irene Limbo, MD;  Location: [facility];  Service: Plastics;  Laterality: Right;   BREAST LUMPECTOMY WITH RADIOACTIVE SEED AND SENTINEL LYMPH NODE BIOPSY Right 09/13/2016   Procedure: RIGHT BREAST LUMPECTOMY WITH RADIOACTIVE SEED X 2 AND SENTINEL LYMPH NODE BIOPSY;  Surgeon: Alphonsa Overall, MD;  Location: [facility];  Service: General;  Laterality: Right;   BREAST SURGERY Right 03/14/2016   Biopsy   CAPSULECTOMY Right 09/13/2016   Procedure: RIGHT CAPSULECTOMY;  Surgeon: Irene Limbo, MD;  Location: [facility];  Service: Plastics;  Laterality: Right;   CATARACT EXTRACTION, BILATERAL Bilateral 08/10/17 right eye, 08/31/17 left eye   MASS EXCISION Left 02/28/2018   Path: benign.  Procedure: EXCISIONLEFT MEDIAL THIGH MASS ERAS PATHWAY;  Surgeon: Erroll Luna, MD;  Location: [facility];  Service: General;  Laterality: Left;   PORTACATH PLACEMENT N/A 03/15/2016   Procedure: INSERTION PORT-A-CATH WITH [place];  Surgeon: Alphonsa Overall, MD;  [facility];  Service: General;  Laterality: N/A;   PORTACATH REMOVAL  07/2017   surgical repair left ankle Left 2009   s/p Fall    TONSILLECTOMY AND ADENOIDECTOMY  1948   Age 3   Social History   Socioeconomic History   Marital status: Divorced    Spouse name: Not on file   Number of children: Not on file   Years of education: Not on file   Highest education level: Not on file  Occupational History   Not on file  Social Needs   Financial resource  strain: Not on file   Food insecurity    Worry: Not on file    Inability: Not on file   Transportation needs    Medical: Not on file    Non-medical: Not on file  Tobacco Use   Smoking status: Former Smoker    Packs/day: 1.00    Types: Cigarettes    Quit date: 03/08/1999    Years since quitting: 20.0   Smokeless tobacco: Never Used  Substance and Sexual Activity   Alcohol use: Yes    Alcohol/week: 7.0 standard drinks    Types: 7 Shots of liquor per week    Comment:  daily   Drug use: No   Sexual activity: Yes    Partners: Male    Comment: 1st intercourse- 9, partners- 10+, current partner- 8 yrs  Lifestyle   Physical activity    Days per week: Not on file    Minutes per session: Not on file   Stress: Not on file  Relationships   Social connections    Talks on phone: Not on file    Gets together: Not on file    Attends religious service: Not on file    Active member of club or organization: Not on file    Attends meetings of clubs or organizations: Not on file    Relationship status: Not on file  Other Topics Concern   Not on file  Social History Narrative   Divorced, has a son who lives in [place].   Educ: college   Occup: Optometrist, still working part time.   Tob: quit 2000.     Alc: several times a week--1-2 glasses wine.   No drugs.   Allergies  Allergen Reactions   Other Other (See Comments)    STEROIDS- emotional   Penicillins Other (See Comments)    Unsure of reaction, was 78 years old   Family History  Problem Relation Age of Onset   Stroke Mother    Suicidality Father    Stroke Brother    Stroke Son     Current Outpatient Medications (Endocrine & Metabolic):    levothyroxine (SYNTHROID, LEVOTHROID) 112 MCG tablet, Take 1 tablet by mouth daily.   metFORMIN (GLUCOPHAGE-XR) 500 MG 24 hr tablet, Take 1,000 mg by mouth daily before supper.   Current Outpatient Medications (Cardiovascular):    atorvastatin (LIPITOR) 80 MG tablet,  Take 80 mg by mouth every evening.    lisinopril (ZESTRIL) 30 MG tablet, Take 1 tablet (30 mg total) by mouth daily.   Current Outpatient Medications (Analgesics):    Meloxicam 15 MG TBDP, Take 1 tablet by mouth daily as needed. (Patient not taking: Reported on 03/13/2019)   naproxen (NAPROSYN) 500 MG tablet, Take 1 tablet (500 mg total) by mouth 2 (two) times daily with a meal.   Current Outpatient Medications (Other):    Cholecalciferol (VITAMIN D3) 125 MCG (5000 UT) TABS, Take by mouth.   gabapentin (NEURONTIN) 300 MG capsule, Take 1 capsule (300 mg total) by  mouth 3 (three) times daily.   pyridoxine (B-6) 100 MG tablet, Take 100 mg by mouth daily.     Past medical history, social, surgical and family history all reviewed in electronic medical record.  No pertanent information unless stated regarding to the chief complaint.   Review of Systems:  No headache, visual changes, nausea, vomiting, diarrhea, constipation, dizziness, abdominal pain, skin rash, fevers, chills, night sweats, weight loss, swollen lymph nodes, body aches, joint swelling,  chest pain, shortness of breath, mood changes.  Positive muscle aches  Objective  Blood pressure 118/84, pulse 62.    General: No apparent distress alert and oriented x3 mood and affect normal, dressed appropriately.  HEENT: Pupils equal, extraocular movements intact  Respiratory: Patient's speak in full sentences and does not appear short of breath  Cardiovascular: No lower extremity edema, non tender, no erythema  Skin: Warm dry intact with no signs of infection or rash on extremities or on axial skeleton.  Abdomen: Soft nontender  Neuro: Cranial nerves II through XII are intact, neurovascularly intact in all extremities with 2+ DTRs and 2+ pulses.  Lymph: No lymphadenopathy of posterior or anterior cervical chain or axillae bilaterally.  Gait mild antalgic.  MSK:  tender with full range of motion and good stability and symmetric  strength and tone of shoulders, elbows, wrist,  and ankles bilaterally.  Back exam shows some loss of lordosis.  Tender to palpation around the sacroiliac joint in the paraspinal musculature lumbar spine right greater than left.  Pain over the right piriformis area.  Positive straight leg test and 35 degrees of forward flexion.  Negative FABER test.  Decent range of motion of patient's hip.  Knee mild crepitus but no instability.  Neurovascularly intact distally but only 4 out of 5 strength dorsi flexion on the right compared to the left.  Deep tendon reflexes are intact  [id number]; 15 additional minutes spent for Therapeutic exercises as stated in above notes.  This included exercises focusing on stretching, strengthening, with significant focus on eccentric aspects.   Long term goals include an improvement in range of motion, strength, endurance as well as avoiding reinjury. Patient's frequency would include in 1-2 times a day, 3-5 times a week for a duration of 6-12 weeks. Low back exercises that included:  Pelvic tilt/bracing instruction to focus on control of the pelvic girdle and lower abdominal muscles  Glute strengthening exercises, focusing on proper firing of the glutes without engaging the low back muscles Proper stretching techniques for maximum relief for the hamstrings, hip flexors, low back and some rotation where tolerated   Proper technique shown and discussed handout in great detail with ATC.  All questions were discussed and answered.     Impression and Recommendations:     This case required medical decision making of moderate complexity. The above documentation has been reviewed and is accurate and complete Lyndal Pulley, DO       Note: This dictation was prepared with Dragon dictation along with smaller phrase technology. Any transcriptional errors that result from this process are unintentional.

## 2019-04-03 ENCOUNTER — Encounter: Payer: Self-pay | Admitting: Family Medicine

## 2019-04-07 DIAGNOSIS — M5416 Radiculopathy, lumbar region: Secondary | ICD-10-CM

## 2019-04-07 HISTORY — DX: Radiculopathy, lumbar region: M54.16

## 2019-04-08 ENCOUNTER — Encounter: Payer: Self-pay | Admitting: Family Medicine

## 2019-04-09 DIAGNOSIS — Z853 Personal history of malignant neoplasm of breast: Secondary | ICD-10-CM | POA: Diagnosis not present

## 2019-04-09 LAB — HM MAMMOGRAPHY

## 2019-04-10 ENCOUNTER — Telehealth: Payer: Self-pay

## 2019-04-10 ENCOUNTER — Encounter: Payer: Self-pay | Admitting: Family Medicine

## 2019-04-10 NOTE — Telephone Encounter (Signed)
Received results from [facility] mammography, given to PCP for review.

## 2019-04-10 NOTE — Telephone Encounter (Signed)
Noted mammo NEG. [facility] document states "these findings were given to the patient in writing at the time of the examination". Signed:  Crissie Sickles, MD           04/10/2019

## 2019-04-19 DIAGNOSIS — N6011 Diffuse cystic mastopathy of right breast: Secondary | ICD-10-CM | POA: Diagnosis not present

## 2019-04-19 DIAGNOSIS — C50911 Malignant neoplasm of unspecified site of right female breast: Secondary | ICD-10-CM | POA: Diagnosis not present

## 2019-04-22 ENCOUNTER — Telehealth: Payer: Self-pay | Admitting: *Deleted

## 2019-04-22 NOTE — Telephone Encounter (Signed)
Pt left msg stating that her knee is worse since her last OV and would like a call back to discuss what she needs to do.

## 2019-04-22 NOTE — Telephone Encounter (Signed)
Will need to see patient again for the knee to get approval

## 2019-04-23 ENCOUNTER — Other Ambulatory Visit: Payer: Self-pay

## 2019-04-23 ENCOUNTER — Ambulatory Visit (INDEPENDENT_AMBULATORY_CARE_PROVIDER_SITE_OTHER): Payer: Medicare Other | Admitting: Family Medicine

## 2019-04-23 ENCOUNTER — Encounter: Payer: Self-pay | Admitting: Hematology and Oncology

## 2019-04-23 ENCOUNTER — Ambulatory Visit: Payer: Self-pay

## 2019-04-23 ENCOUNTER — Telehealth: Payer: Self-pay | Admitting: *Deleted

## 2019-04-23 ENCOUNTER — Encounter: Payer: Self-pay | Admitting: Family Medicine

## 2019-04-23 ENCOUNTER — Ambulatory Visit (HOSPITAL_COMMUNITY)
Admission: RE | Admit: 2019-04-23 | Discharge: 2019-04-23 | Disposition: A | Discharge status: Home / Self Care | Payer: Medicare Other | Source: Ambulatory Visit | Attending: Family Medicine | Admitting: Family Medicine

## 2019-04-23 VITALS — BP 128/80 | HR 70 | Ht 67.0 in | Wt 159.0 lb

## 2019-04-23 DIAGNOSIS — M25561 Pain in right knee: Secondary | ICD-10-CM

## 2019-04-23 DIAGNOSIS — G8929 Other chronic pain: Secondary | ICD-10-CM

## 2019-04-23 DIAGNOSIS — M79604 Pain in right leg: Secondary | ICD-10-CM | POA: Insufficient documentation

## 2019-04-23 DIAGNOSIS — I82409 Acute embolism and thrombosis of unspecified deep veins of unspecified lower extremity: Secondary | ICD-10-CM

## 2019-04-23 DIAGNOSIS — I82451 Acute embolism and thrombosis of right peroneal vein: Secondary | ICD-10-CM

## 2019-04-23 DIAGNOSIS — I82461 Acute embolism and thrombosis of right calf muscular vein: Secondary | ICD-10-CM

## 2019-04-23 HISTORY — DX: Acute embolism and thrombosis of unspecified deep veins of unspecified lower extremity: I82.409

## 2019-04-23 MED ORDER — RIVAROXABAN (XARELTO) VTE STARTER PACK (15 & 20 MG): ORAL_TABLET | ORAL | 0 refills | Status: DC

## 2019-04-23 NOTE — Telephone Encounter (Signed)
Called patient and she is scheduled today.

## 2019-04-23 NOTE — Assessment & Plan Note (Signed)
Patient has lower extremity edema noted positive squeeze test of the calf because significant increase in pain.  Some mild ultrasound showed some mild noncompressible veins and make it worrisome for potential deep venous thrombosis.  Patient's past medical history significant for breast cancer.  Patient will be sent for a Doppler.  If normal then we would further evaluate patient's right knee that did show a small effusion and a very small Baker's cyst.  Patient's pain now seems to be more posterior.  Will await further imaging to discuss treatment.

## 2019-04-23 NOTE — Progress Notes (Signed)
Right lower extremity venous duplex completed. Refer to "CV Proc" under chart review to view preliminary results.  Critical results discussed with Dr. Tamala Julian.  04/23/2019 2:12 PM Kelby Aline., MHA, RVT, RDCS, RDMS

## 2019-04-23 NOTE — Patient Instructions (Addendum)
[facility]  [address], [place], [place] [address] [phone]  Will do knee injection if the doppler comes back negative We will be in touch

## 2019-04-23 NOTE — Progress Notes (Signed)
[facility] [place], [place] [address] Phone: [phone] Subjective:   [name], am serving as a scribe for Dr. Hulan Saas.  CC: Right leg  [id number]  Marissa Hall is a 78 y.o. female coming in with complaint of right calf, knee and foot pain. Noticed sharp pain on Friday night. Went to the restroom while out to eat on Friday and was unable to get up from toilet without holding handles on side of restroom wall. Constant ache in her calf. Also having sharp pain over the 1st metatarsal. Patient notes a fall in March      Past Medical History:  Diagnosis Date  . Anxiety and depression 1964   oncologist started duloxetine 12/2016  . Breast cancer ([place]) 03/14/2016   Clinical stage 2A: (triple neg): Right breast, upper inner quadrant, 03/2016.  Neoadjuvant chemo x 5 cycles,lumpectomy 4 mo later, then RT started 10/2016.  Adjuvant Xeloda [id number].  SWOG research trial pt 04/2017--pt randomized to pembrolizumab immunotherapy.  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  [name] Kitchen Cataracts, bilateral 07/2017  . Chemotherapy-induced neuropathy ([place]) 07/04/2016   feet; responding well to cymbalta  . Depression 1964   Patient states since age 53  . Diabetes mellitus with complication ([place]) [id number]   managed by endocrinology.  A1c Mar 12, 2018 was 7.0% at Dr. Shirlyn Goltz.   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  Excision done 02/2019  . GERD (gastroesophageal reflux disease) 2013  . Hyperlipidemia 1986  . Hypertension 2008  . Hypothyroidism 1988   Diagnosed in her 88s.  Managed by Endocrinologist  . Lumbar radiculopathy 04/2019   Dr. Tamala Julian to get plain films of LB and hip (considering MRI due to her hx of cancer)  . Osteoporosis 2015   pt states "osteopenia", but then says that she refused to take the rx med for this condition, so I  suspect she had osteoporosis.  . Peripheral neuropathy 2017   Patient states diabetic neuropathy in feet prior to starting chemotherapy and then worsened by chemo.   [name] Kitchen TIA (transient ischemic attack) 03/26/2011   Past Surgical History:  Procedure Laterality Date  . ABDOMINAL HYSTERECTOMY  1972  . APPENDECTOMY  1972  . BREAST ENHANCEMENT SURGERY  1982  . BREAST IMPLANT REMOVAL Right 09/13/2016   Procedure: REMOVAL RIGHT BREAST IMPLANT;  Surgeon: Irene Limbo, MD;  Location: [facility];  Service: Plastics;  Laterality: Right;  . BREAST LUMPECTOMY WITH RADIOACTIVE SEED AND SENTINEL LYMPH NODE BIOPSY Right 09/13/2016   Procedure: RIGHT BREAST LUMPECTOMY WITH RADIOACTIVE SEED X 2 AND SENTINEL LYMPH NODE BIOPSY;  Surgeon: Alphonsa Overall, MD;  Location: [facility];  Service: General;  Laterality: Right;  . BREAST SURGERY Right 03/14/2016   Biopsy  . CAPSULECTOMY Right 09/13/2016   Procedure: RIGHT CAPSULECTOMY;  Surgeon: Irene Limbo, MD;  Location: [facility];  Service: Plastics;  Laterality: Right;  . CATARACT EXTRACTION, BILATERAL Bilateral 08/10/17 right eye, 08/31/17 left eye  . MASS EXCISION Left 02/28/2018   Path: benign.  Procedure: EXCISIONLEFT MEDIAL THIGH MASS ERAS PATHWAY;  Surgeon: Erroll Luna, MD;  Location: [facility];  Service: General;  Laterality: Left;  . PORTACATH PLACEMENT N/A 03/15/2016   Procedure: INSERTION PORT-A-CATH WITH [place];  Surgeon: Alphonsa Overall, MD;  Location: [facility];  Service: General;  Laterality: N/A;  . PORTACATH REMOVAL  07/2017  . surgical repair left ankle Left 2009   s/p Fall   . TONSILLECTOMY AND ADENOIDECTOMY  1948   Age 38   Social History   Socioeconomic History  . Marital status: Divorced    Spouse name: Not on file  . Number of children: Not on file  . Years of education: Not on file  . Highest education level: Not on file  Occupational History  . Not on file  Social Needs  .  Financial resource strain: Not on file  . Food insecurity    Worry: Not on file    Inability: Not on file  . Transportation needs    Medical: Not on file    Non-medical: Not on file  Tobacco Use  . Smoking status: Former Smoker    Packs/day: 1.00    Types: Cigarettes    Quit date: 03/08/1999    Years since quitting: 20.1  . Smokeless tobacco: Never Used  Substance and Sexual Activity  . Alcohol use: Yes    Alcohol/week: 7.0 standard drinks    Types: 7 Shots of liquor per week    Comment:  daily  . Drug use: No  . Sexual activity: Yes    Partners: Male    Comment: 1st intercourse- 38, partners- 10+, current partner- 8 yrs  Lifestyle  . Physical activity    Days per week: Not on file    Minutes per session: Not on file  . Stress: Not on file  Relationships  . Social Herbalist on phone: Not on file    Gets together: Not on file    Attends religious service: Not on file    Active member of club or organization: Not on file    Attends meetings of clubs or organizations: Not on file    Relationship status: Not on file  Other Topics Concern  . Not on file  Social History Narrative   Divorced, has a son who lives in [place].   Educ: college   Occup: Optometrist, still working part time.   Tob: quit 2000.     Alc: several times a week--1-2 glasses wine.   No drugs.   Allergies  Allergen Reactions  . Other Other (See Comments)    STEROIDS- emotional  . Penicillins Other (See Comments)    Unsure of reaction, was 78 years old   Family History  Problem Relation Age of Onset  . Stroke Mother   . Suicidality Father   . Stroke Brother   . Stroke Son     Current Outpatient Medications (Endocrine & Metabolic):  .  levothyroxine (SYNTHROID, LEVOTHROID) 112 MCG tablet, Take 1 tablet by mouth daily. .  metFORMIN (GLUCOPHAGE-XR) 500 MG 24 hr tablet, Take 1,000 mg by mouth daily before supper.   Current Outpatient Medications (Cardiovascular):  .  atorvastatin  (LIPITOR) 80 MG tablet, Take 80 mg by mouth every evening.  [name] Kitchen  lisinopril (ZESTRIL) 30 MG tablet, Take 1 tablet (30 mg total) by mouth daily.   Current Outpatient Medications (Analgesics):  [name] Kitchen  Meloxicam 15 MG TBDP, Take 1 tablet by mouth daily as needed. .  naproxen (NAPROSYN) 500 MG tablet, Take 1 tablet (500 mg total) by mouth 2 (two) times daily with a meal.   Current Outpatient Medications (Other):  [name] Kitchen  Cholecalciferol (VITAMIN D3) 125 MCG (5000 UT) TABS, Take by mouth. .  gabapentin (NEURONTIN) 300 MG capsule, Take 1 capsule (300 mg total)  by mouth 3 (three) times daily. [name] Kitchen  pyridoxine (B-6) 100 MG tablet, Take 100 mg by mouth daily.     Past medical history, social, surgical and family history all reviewed in electronic medical record.  No pertanent information unless stated regarding to the chief complaint.   Review of Systems:  No headache, visual changes, nausea, vomiting, diarrhea, constipation, dizziness, abdominal pain, skin rash, fevers, chills, night sweats, weight loss, swollen lymph nodes, body aches, joint swelling, chest pain, shortness of breath, mood changes.  Positive muscle aches  Objective  Blood pressure 128/80, pulse 70, height 5\' 7"  (1.702 m), weight 159 lb (72.1 kg), SpO2 98 %.    General: No apparent distress alert and oriented x3 mood and affect normal, dressed appropriately.  HEENT: Pupils equal, extraocular movements intact  Respiratory: Patient's speak in full sentences and does not appear short of breath  Cardiovascular: No lower extremity edema, non tender, no erythema  Skin: Warm dry intact with no signs of infection or rash on extremities or on axial skeleton.  Abdomen: Soft nontender  Neuro: Cranial nerves II through XII are intact, neurovascularly intact in all extremities with 2+ DTRs and 2+ pulses.  Lymph: No lymphadenopathy of posterior or anterior cervical chain or axillae bilaterally.  Gait antalgic favoring right leg MSK:  tender with mild  limited range of motion and good stability and symmetric strength and tone of shoulders, elbows, wrist, hip, and ankles bilaterally.  Moderate arthritic changes of multiple joints Right knee exam shows a positive patella grind.  Patient does not have any significant instability noted at the moment.  Patient does have tender to palpation over the medial joint line but more over the calf itself. Positive squeeze test of the calf.  Limited musculoskeletal ultrasound was performed and interpreted by Lyndal Pulley  Limited musculoskeletal ultrasound of patient's right knee shows a very small effusion noted to the patellofemoral joint and moderate narrowing.  Patient does have a very small Baker's cyst.  Unfortunately patient's popliteal artery is not fully compressible but no true clot noted.   Impression and Recommendations:     This case required medical decision making of moderate complexity. The above documentation has been reviewed and is accurate and complete Lyndal Pulley, DO       Note: This dictation was prepared with Dragon dictation along with smaller phrase technology. Any transcriptional errors that result from this process are unintentional.

## 2019-04-23 NOTE — Addendum Note (Signed)
Addended by: Lyndal Pulley on: 04/23/2019 02:02 PM   Modules accepted: Orders

## 2019-04-23 NOTE — Addendum Note (Signed)
Addended by: Lyndal Pulley on: 04/23/2019 04:56 PM   Modules accepted: Orders

## 2019-04-23 NOTE — Telephone Encounter (Signed)
Pt left msg stating she was positive for a DVT and Dr. Tamala Julian sent in a prescription to her pharmacy but before she picks up the prescription she would like a call back to discuss the results of the Doppler.

## 2019-04-23 NOTE — Assessment & Plan Note (Addendum)
Patient did have the Doppler done and did show that patient did have a DVT.  Patient will be started on Xarelto discussed with patient over the phone that she should follow-up with primary care provider as well we can see her for knee pain in the near future.  Second addendum to note.  Called patient at 4:30 PM.  Talk to patient about the importance of taking this medication.  Patient discussed other alternatives to the medication because it cost $94.  Discussed with her this is likely the cheapest option at the moment.  Discontinue anti-inflammatories and encourage patient to take this medication otherwise significant risk of pulmonary embolism.  Discussed with patient if any shortness of breath she needs to go to the emergency room immediately and call 911.  Patient will follow up with her oncologist who is also a hematologist to discuss further treatment options.  Patient is in agreement with the plan and we will follow up with her for other modalities and treatment of her other musculoskeletal complaints in the near future.

## 2019-04-24 ENCOUNTER — Telehealth: Payer: Self-pay | Admitting: *Deleted

## 2019-04-24 ENCOUNTER — Inpatient Hospital Stay: Payer: Medicare Other

## 2019-04-24 ENCOUNTER — Encounter: Payer: Self-pay | Admitting: *Deleted

## 2019-04-24 ENCOUNTER — Other Ambulatory Visit: Payer: Self-pay | Admitting: *Deleted

## 2019-04-24 ENCOUNTER — Encounter: Payer: Self-pay | Admitting: Family Medicine

## 2019-04-24 ENCOUNTER — Other Ambulatory Visit: Payer: Self-pay

## 2019-04-24 ENCOUNTER — Inpatient Hospital Stay: Payer: Medicare Other | Attending: Hematology and Oncology

## 2019-04-24 ENCOUNTER — Inpatient Hospital Stay (HOSPITAL_BASED_OUTPATIENT_CLINIC_OR_DEPARTMENT_OTHER): Payer: Medicare Other | Admitting: Hematology and Oncology

## 2019-04-24 ENCOUNTER — Telehealth: Payer: Self-pay | Admitting: Hematology and Oncology

## 2019-04-24 VITALS — BP 169/69 | HR 70 | Temp 98.2°F | Resp 19 | Ht 67.0 in | Wt 171.2 lb

## 2019-04-24 DIAGNOSIS — C50211 Malignant neoplasm of upper-inner quadrant of right female breast: Secondary | ICD-10-CM

## 2019-04-24 DIAGNOSIS — Z7901 Long term (current) use of anticoagulants: Secondary | ICD-10-CM | POA: Diagnosis not present

## 2019-04-24 DIAGNOSIS — I82459 Acute embolism and thrombosis of unspecified peroneal vein: Secondary | ICD-10-CM | POA: Insufficient documentation

## 2019-04-24 DIAGNOSIS — I82451 Acute embolism and thrombosis of right peroneal vein: Secondary | ICD-10-CM

## 2019-04-24 DIAGNOSIS — Z923 Personal history of irradiation: Secondary | ICD-10-CM | POA: Diagnosis not present

## 2019-04-24 DIAGNOSIS — Z79899 Other long term (current) drug therapy: Secondary | ICD-10-CM | POA: Diagnosis not present

## 2019-04-24 DIAGNOSIS — Z006 Encounter for examination for normal comparison and control in clinical research program: Secondary | ICD-10-CM | POA: Diagnosis not present

## 2019-04-24 DIAGNOSIS — Z171 Estrogen receptor negative status [ER-]: Secondary | ICD-10-CM | POA: Insufficient documentation

## 2019-04-24 LAB — CMP (CANCER CENTER ONLY)
ALT: 40 U/L (ref 0–44)
AST: 35 U/L (ref 15–41)
Albumin: 4.5 g/dL (ref 3.5–5.0)
Alkaline Phosphatase: 62 U/L (ref 38–126)
Anion gap: 13 (ref 5–15)
BUN: 20 mg/dL (ref 8–23)
CO2: 25 mmol/L (ref 22–32)
Calcium: 9.7 mg/dL (ref 8.9–10.3)
Chloride: 101 mmol/L (ref 98–111)
Creatinine: 0.9 mg/dL (ref 0.44–1.00)
GFR, Est AFR Am: >60 mL/min (ref >60)
GFR, Estimated: >60 mL/min (ref >60)
Glucose, Bld: 197 mg/dL — ABNORMAL HIGH (ref 70–99)
Potassium: 4.8 mmol/L (ref 3.5–5.1)
Sodium: 139 mmol/L (ref 135–145)
Total Bilirubin: 1.2 mg/dL (ref 0.3–1.2)
Total Protein: 7.9 g/dL (ref 6.5–8.1)

## 2019-04-24 LAB — CBC WITH DIFFERENTIAL (CANCER CENTER ONLY)
Abs Immature Granulocytes: 0.01 10*3/uL (ref 0.00–0.07)
Basophils Absolute: 0 10*3/uL (ref 0.0–0.1)
Basophils Relative: 1 %
Eosinophils Absolute: 0.1 10*3/uL (ref 0.0–0.5)
Eosinophils Relative: 3 %
HCT: 42.3 % (ref 36.0–46.0)
Hemoglobin: 14 g/dL (ref 12.0–15.0)
Immature Granulocytes: 0 %
Lymphocytes Relative: 30 %
Lymphs Abs: 1.4 10*3/uL (ref 0.7–4.0)
MCH: 32.5 pg (ref 26.0–34.0)
MCHC: 33.1 g/dL (ref 30.0–36.0)
MCV: 98.1 fL (ref 80.0–100.0)
Monocytes Absolute: 0.4 10*3/uL (ref 0.1–1.0)
Monocytes Relative: 9 %
Neutro Abs: 2.6 10*3/uL (ref 1.7–7.7)
Neutrophils Relative %: 57 %
Platelet Count: 148 10*3/uL — ABNORMAL LOW (ref 150–400)
RBC: 4.31 MIL/uL (ref 3.87–5.11)
RDW: 13.6 % (ref 11.5–15.5)
WBC Count: 4.6 10*3/uL (ref 4.0–10.5)
nRBC: 0 % (ref 0.0–0.2)

## 2019-04-24 MED ORDER — RIVAROXABAN (XARELTO) VTE STARTER PACK (15 & 20 MG): ORAL_TABLET | ORAL | 0 refills | Status: DC

## 2019-04-24 MED FILL — XARELTO STARTER PACK: 15 & 20 | 30 days supply | Qty: 51 | Fill #0

## 2019-04-24 NOTE — Assessment & Plan Note (Addendum)
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  T2 N0 stage 2A clinical stage  Treatment summary: 1. Neoadjuvant chemotherapy with dose dense Adriamycin and Cytoxan 4 followed by Abraxane weekly 5(stopped early due to neuropathy) 2. 09/13/2016: Right lumpectomy: IDC grade 3, 2 foci, 2 cm and 1.1 cm, 0/3 lymph nodes negative margins negative, ER 0%, PR 0%, HER-2 negative ratio 1.02, Ki-67 60%, RCB-II; ypT2ypN0 Stage 2A (with plastic surgery removing the ruptured implant) 3. Followed by radiation therapy completed 12/06/2016 4.Adjuvant Xeloda 1000 mg by mouth twice a day 2 weeks on one week off 12/30/2016-Oct 2018 5.SWOG 1418 clinical trial Pembrolizumabcycle 1 given 05/03/2017(patient decided to discontinue clinical trial) ----------------------------------------------------------------------------------------------------------------------------------------- Surveillance: 1.Breast exam 04/24/2019: Benign 2.mammogram11/3/2020at [facility]: No evidence of malignancy. Breast density categoryB  Chronic fatigue Chemo-induced peripheral neuropathy: Monitoring

## 2019-04-24 NOTE — Assessment & Plan Note (Signed)
Peroneal vein DVT: Unable to start on Xarelto because of insurance issues. I would like to start her on Lovenox injections.  She will come daily for the next 3 days until Xarelto is approved. I discussed the pros and cons of Xarelto and felt that it was the best treatment plan for her. If Xarelto does not get approved and we will consider Coumadin therapy.

## 2019-04-24 NOTE — Telephone Encounter (Signed)
Patient emailed research RN regarding newly diagnosed DVT. She would like to see Dr. Lindi Adie for this and needs help with getting Xarelto filled/ approved by her insurance company, if that is what Dr. Lindi Adie recommends.  Spoke with patient and she would appreciate help with getting appointment to see Dr. Lindi Adie as soon as possible.  Informed Dr. Lindi Adie of above and he will see patient today.  Informed patient that her 24 months study visit for S1418 is scheduled for 05/17/19 but we can go ahead and complete the study today since she is coming in. This will prevent her from having to return again in a few weeks. Patient agreed to study visit today along with being seen for DVT.   Foye Spurling, BSN, RN Clinical Research Nurse 04/24/2019 11:00 AM

## 2019-04-24 NOTE — Telephone Encounter (Signed)
I talk with patient regarding schedule  

## 2019-04-24 NOTE — Research (Signed)
24 Months Follow Up Visit for [id number] Clinical Trial:  Patient into clinic today to see Dr. Lindi Adie for newly diagnosed DVT.  24 month study visit will be completed today since it is within study window for this visit and to prevent patient from returning in a few weeks as previously scheduled.  PROs; Research nurse greeted patient inlobby and gave her PROs to complete while waiting for lab appointment.  Collected and checked for completeness and accuracy.  Labs;Completed per protocol. Concomitant medicationsreviewedwith patient;Medication list updated accordingly. Xarelto prescribed by MD yesterday but patient has not started taking it yet.   H&P, PS, Toxicity and Recurrence assessment;  Completed by Dr. Lindi Adie. See MD encounter note from today.  Thanked patient very much for her time today andparticipation in this study. Her next study visit is due 11/20/19 (+/- 4 weeks) Asked patient to call research nurse if any questions or problems prior to next study visit.  She verbalized understanding.  Foye Spurling, BSN, RN Clinical Research Nurse 04/24/2019 12:30 PM

## 2019-04-24 NOTE — Telephone Encounter (Signed)
Have discussed with patient on numerous occasions.  Discussed again the importance of taking the medication.  Patient wants to continue to wait for her hematologist patient states that she has a visit today.

## 2019-04-24 NOTE — Progress Notes (Signed)
Patient Care Team: Tammi Sou, MD as PCP - General (Family Medicine) Alphonsa Overall, MD as Consulting Physician (General Surgery) Nicholas Lose, MD as Consulting Physician (Hematology and Oncology) Kyung Rudd, MD as Consulting Physician (Radiation Oncology) Irene Limbo, MD as Consulting Physician (Plastic Surgery) Gerome Apley, MD as Consulting Physician (Endocrinology) Delice Bison, Charlestine Massed, NP as Nurse Practitioner (Hematology and Oncology) Princess Bruins, MD as Consulting Physician (Obstetrics and Gynecology)  DIAGNOSIS:  Encounter Diagnoses  Name Primary?  . Acute deep vein thrombosis (DVT) of right peroneal vein (HCC) Yes  . Malignant neoplasm of upper-inner quadrant of right breast in female, estrogen receptor negative ([place])     SUMMARY OF ONCOLOGIC HISTORY: Oncology History  Breast cancer of upper-inner quadrant of right female breast ([facility])  02/29/2016 Initial Diagnosis   Right breast palpable mass (with silicone implants 7322), 3.5 cm on MRI, additional 3 cm anterior linear enhancement? 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 Ki-67 60%, T2 N0 stage 2A clinical stage   03/14/2016 Procedure   Right breast biopsy upper inner quadrant: IDC grade 3   03/28/2016 -  Neo-Adjuvant Chemotherapy   Neoadjuvant chemotherapy with dose dense Adriamycin and Cytoxan followed by Abraxane weekly 5 ( patient is diabetic and cannot take steroids)   07/15/2016 Breast MRI   Right breast: Spiculated mass 1.5 cm significantly smaller compared to prior,NME previously seen is not noted, no abnormal lymph nodes   09/13/2016 Surgery   Removal of the silicone implant due to intracapsular rupture and capsulectomy (Dr.Thimappa)   09/13/2016 Surgery   Right lumpectomy: IDC grade 3, 2 foci, 2 cm and 1.1 cm, 0/3 lymph nodes negative margins negative, ER 0%, PR 0%, HER-2 negative ratio 1.02, Ki-67 60%, RCB-II; ypT2ypN0 Stage 2A    10/20/2016 - 12/06/2016 Radiation Therapy   Adjuvant radiation therapy   12/30/2016 - 04/05/2017 Chemotherapy   Xeloda 1000 mg by mouth twice a day adjuvant therapy x 4 cycles    05/03/2017 - 05/24/2017 Chemotherapy   SWOG S 1418 Pembrolizumab on clinical trial stopped after 2 doses by patient preference (not due to toxicities .)     CHIEF COMPLIANT: New diagnosis of DVT  INTERVAL HISTORY: Marissa Hall is a 78 year old with above-mentioned history of right breast cancer treated with neoadjuvant chemotherapy followed by lumpectomy and radiation followed by Xarelto.  She is currently on observation and surveillance.  She reports that recently she had pain in her right posterior knee and evaluation by her sports physician with ultrasound revealed peroneal vein DVT.  She was prescribed Xarelto but she could not fill it because her insurance was not covering it.  She came in today for her annual checkup of breast cancer but also to talk about options for treatment of DVT. She has had chronic pain in the right hip and right leg for which she takes intermittent naproxen.  She is also being applying topical ointments for pain relief.  She reports that NSAIDs do not seem to be helping her but the topical medications appear to be helping.  REVIEW OF SYSTEMS:   Constitutional: Denies fevers, chills or abnormal weight loss Eyes: Denies blurriness of vision Ears, nose, mouth, throat, and face: Denies mucositis or sore throat Respiratory: Denies cough, dyspnea or wheezes Cardiovascular: Denies palpitation, chest discomfort Gastrointestinal:  Denies nausea, heartburn or change in bowel habits Skin: Denies abnormal skin rashes Lymphatics: Denies new lymphadenopathy or easy bruising Neurological: Pain in the right hip and thigh Behavioral/Psych: Mood is stable, no new changes  Extremities: No lower extremity edema Breast:  denies any pain or lumps or nodules in either breasts All other systems were reviewed with the patient and are negative.   I have reviewed the past medical history, past surgical history, social history and family history with the patient and they are unchanged from previous note.  ALLERGIES:  is allergic to other and penicillins.  MEDICATIONS:  Current Outpatient Medications  Medication Sig Dispense Refill  . atorvastatin (LIPITOR) 80 MG tablet Take 80 mg by mouth every evening.     . Cholecalciferol (VITAMIN D3) 125 MCG (5000 UT) TABS Take by mouth.    . gabapentin (NEURONTIN) 300 MG capsule Take 1 capsule (300 mg total) by mouth 3 (three) times daily. 90 capsule 3  . levothyroxine (SYNTHROID, LEVOTHROID) 112 MCG tablet Take 1 tablet by mouth daily.    [name] Kitchen lisinopril (ZESTRIL) 30 MG tablet Take 1 tablet (30 mg total) by mouth daily. 30 tablet 0  . metFORMIN (GLUCOPHAGE-XR) 500 MG 24 hr tablet Take 1,000 mg by mouth daily before supper.     . pyridoxine (B-6) 100 MG tablet Take 100 mg by mouth daily.     . Rivaroxaban 15 & 20 MG TBPK Follow package directions: Take one 90m tablet by mouth twice a day. On day 22, switch to one 25mtablet once a day. Take with food. 51 each 0   No current facility-administered medications for this visit.     PHYSICAL EXAMINATION: ECOG PERFORMANCE STATUS: 1 - Symptomatic but completely ambulatory  Vitals:   04/24/19 1201  BP: (!) 169/69  Pulse: 70  Resp: 19  Temp: 98.2 F (36.8 C)  SpO2: 98%   Filed Weights   04/24/19 1201  Weight: 171 lb 3.2 oz (77.7 kg)    GENERAL:alert, no distress and comfortable SKIN: skin color, texture, turgor are normal, no rashes or significant lesions EYES: normal, Conjunctiva are pink and non-injected, sclera clear OROPHARYNX:no exudate, no erythema and lips, buccal mucosa, and tongue normal  NECK: supple, thyroid normal size, non-tender, without nodularity LYMPH:  no palpable lymphadenopathy in the cervical, axillary or inguinal LUNGS: clear to auscultation and percussion with normal breathing effort HEART: regular rate & rhythm and  no murmurs and no lower extremity edema ABDOMEN:abdomen soft, non-tender and normal bowel sounds MUSCULOSKELETAL:no cyanosis of digits and no clubbing  NEURO: alert & oriented x 3 with fluent speech, no focal motor/sensory deficits EXTREMITIES: No lower extremity edema BREAST: No palpable masses or nodules in either right or left breasts. No palpable axillary supraclavicular or infraclavicular adenopathy no breast tenderness or nipple discharge. (exam performed in the presence of a chaperone)  LABORATORY DATA:  I have reviewed the data as listed CMP Latest Ref Rng & Units 04/24/2019 05/21/2018 02/22/2018  Glucose 70 - 99 mg/dL 197(H) 133(H) 153(H)  BUN 8 - 23 mg/dL '20 18 15  ' Creatinine 0.44 - 1.00 mg/dL 0.90 0.92 0.79  Sodium 135 - 145 mmol/L 139 139 140  Potassium 3.5 - 5.1 mmol/L 4.8 4.7 4.6  Chloride 98 - 111 mmol/L 101 103 105  CO2 22 - 32 mmol/L '25 26 28  ' Calcium 8.9 - 10.3 mg/dL 9.7 9.9 9.5  Total Protein 6.5 - 8.1 g/dL 7.9 7.8 7.2  Total Bilirubin 0.3 - 1.2 mg/dL 1.2 1.4(H) 1.0  Alkaline Phos 38 - 126 U/L 62 74 74  AST 15 - 41 U/L 35 38 28  ALT 0 - 44 U/L 40 45(H) 36    Lab Results  Component Value  Date   WBC 4.6 04/24/2019   HGB 14.0 04/24/2019   HCT 42.3 04/24/2019   MCV 98.1 04/24/2019   PLT 148 (L) 04/24/2019   NEUTROABS 2.6 04/24/2019    ASSESSMENT & PLAN:  DVT (deep venous thrombosis) (HCC) Peroneal vein DVT: Unable to start on Xarelto because of insurance issues. We were able to obtain a first month voucher for Xarelto to be provided at the [facility]. She will hear from her insurance company tomorrow. After the first 3 weeks, she can go to 20 mg daily.  Breast cancer of upper-inner quadrant of right female breast ([place]) 02/29/2016: Right breast palpable mass (with silicone implants 1021), 3.5 cm on MRI, additional 3 cm anterior linear enhancement (biopsy 03/14/2016 IDC grade 3); 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  T2 N0 stage 2A clinical stage   Treatment summary: 1. Neoadjuvant chemotherapy with dose dense Adriamycin and Cytoxan 4 followed by Abraxane weekly 5(stopped early due to neuropathy) 2. 09/13/2016: Right lumpectomy: IDC grade 3, 2 foci, 2 cm and 1.1 cm, 0/3 lymph nodes negative margins negative, ER 0%, PR 0%, HER-2 negative ratio 1.02, Ki-67 60%, RCB-II; ypT2ypN0 Stage 2A (with plastic surgery removing the ruptured implant) 3. Followed by radiation therapy completed 12/06/2016 4.Adjuvant Xeloda 1000 mg by mouth twice a day 2 weeks on one week off 12/30/2016-Oct 2018 5.SWOG 1418 clinical trial Pembrolizumabcycle 1 given 05/03/2017(patient decided to discontinue clinical trial) ----------------------------------------------------------------------------------------------------------------------------------------- Surveillance: 1.Breast exam 04/24/2019: Benign 2.mammogram11/3/2020at [facility]: No evidence of malignancy. Breast density categoryB  Chronic fatigue Chemo-induced peripheral neuropathy: Monitoring Return to clinic in 6 months with labs and follow-up No orders of the defined types were placed in this encounter.  The patient has a good understanding of the overall plan. she agrees with it. she will call with any problems that may develop before the next visit here.   Harriette Ohara, MD 04/24/19

## 2019-04-25 ENCOUNTER — Ambulatory Visit (INDEPENDENT_AMBULATORY_CARE_PROVIDER_SITE_OTHER): Payer: Medicare Other | Admitting: Family Medicine

## 2019-04-25 ENCOUNTER — Encounter: Payer: Self-pay | Admitting: Family Medicine

## 2019-04-25 VITALS — Temp 98.2°F | Resp 16 | Ht 67.0 in | Wt 171.0 lb

## 2019-04-25 DIAGNOSIS — F329 Major depressive disorder, single episode, unspecified: Secondary | ICD-10-CM

## 2019-04-25 DIAGNOSIS — I82431 Acute embolism and thrombosis of right popliteal vein: Secondary | ICD-10-CM

## 2019-04-25 DIAGNOSIS — G620 Drug-induced polyneuropathy: Secondary | ICD-10-CM

## 2019-04-25 DIAGNOSIS — F419 Anxiety disorder, unspecified: Secondary | ICD-10-CM | POA: Diagnosis not present

## 2019-04-25 DIAGNOSIS — G894 Chronic pain syndrome: Secondary | ICD-10-CM | POA: Diagnosis not present

## 2019-04-25 DIAGNOSIS — T451X5A Adverse effect of antineoplastic and immunosuppressive drugs, initial encounter: Secondary | ICD-10-CM | POA: Diagnosis not present

## 2019-04-25 DIAGNOSIS — F32A Depression, unspecified: Secondary | ICD-10-CM

## 2019-04-25 NOTE — Progress Notes (Signed)
Virtual Visit via Video Note  I connected with pt on 04/25/19 at 10:00 AM EST by a video enabled telemedicine application and verified that I am speaking with the correct person using two identifiers.  Location patient: home Location provider:work or home office Persons participating in the virtual visit: patient, provider  I discussed the limitations of evaluation and management by telemedicine and the availability of in person appointments. The patient expressed understanding and agreed to proceed.  Telemedicine visit is a necessity given the COVID-19 restrictions in place at the current time.  HPI: 78 y/o WF being seen today for f/u uncontrolled HTN, chemo induced PN, recent R gluteal pain (tendonopathy?),and recent dx of R popliteal DVT.  She is in quite a bit of pain->R glut/thigh, chronic LL's PN pain, worried about her right leg DVT, insurance/money issues.  Can tolerate only 600 mg gabapentin b/c daytime dose caused some vision abnormalities.  Can't take NSAIDs now that she has DVT and is on xarelto, plus she says they didn't help. Tylenol has caused elevated liver enzymes in her in the past so she is afraid of it. She won't take opioids b/c hx of alc abuse and doesn't want to chance it.  Mood is about the same as it always is. She is coping well most days.  Didn't tolerate cymbalta. Has fluoxetine but has not wanted to start taking it but is going to reconsider. Still going to [facility] for alc abuse counseling.  HTN:  bp at home consistently <130/80.   DM2/HLD/Hypoth: stable lately per pt report, follwed by endo.  ROS: See pertinent positives and negatives per HPI.  Past Medical History:  Diagnosis Date  . Acute DVT (deep venous thrombosis) ([place]) 04/23/2019   Right popliteal  . Anxiety and depression 1964   oncologist started duloxetine 12/2016  . Breast cancer ([place]) 03/14/2016   Clinical stage 2A: (triple neg): Right breast, upper inner quadrant, 03/2016.   Neoadjuvant chemo x 5 cycles,lumpectomy 4 mo later, then RT started 10/2016.  Adjuvant Xeloda [id number].  SWOG research trial pt 04/2017--pt randomized to pembrolizumab immunotherapy.  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  [name] Kitchen Cataracts, bilateral 07/2017  . Chemotherapy-induced neuropathy ([place]) 07/04/2016   feet; responding well to cymbalta  . Depression 1964   Patient states since age 40  . Diabetes mellitus with complication ([place]) [id number]   managed by endocrinology.  A1c Mar 12, 2018 was 7.0% at Dr. Shirlyn Goltz.   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  Excision done 02/2019  . GERD (gastroesophageal reflux disease) 2013  . Hyperlipidemia 1986  . Hypertension 2008  . Hypothyroidism 1988   Diagnosed in her 44s.  Managed by Endocrinologist  . Lumbar radiculopathy 04/2019   Dr. Tamala Julian to get plain films of LB and hip (considering MRI due to her hx of cancer)  . Osteoporosis 2015   pt states "osteopenia", but then says that she refused to take the rx med for this condition, so I suspect she had osteoporosis.  . Peripheral neuropathy 2017   Patient states diabetic neuropathy in feet prior to starting chemotherapy and then worsened by chemo.   [name] Kitchen TIA (transient ischemic attack) 03/26/2011    Past Surgical History:  Procedure Laterality Date  . ABDOMINAL HYSTERECTOMY  1972  . APPENDECTOMY  1972  . BREAST ENHANCEMENT SURGERY  1982  . BREAST IMPLANT REMOVAL  Right 09/13/2016   Procedure: REMOVAL RIGHT BREAST IMPLANT;  Surgeon: Irene Limbo, MD;  Location: [facility];  Service: Plastics;  Laterality: Right;  . BREAST LUMPECTOMY WITH RADIOACTIVE SEED AND SENTINEL LYMPH NODE BIOPSY Right 09/13/2016   Procedure: RIGHT BREAST LUMPECTOMY WITH RADIOACTIVE SEED X 2 AND SENTINEL LYMPH NODE BIOPSY;  Surgeon: Alphonsa Overall, MD;  Location: [facility];  Service:  General;  Laterality: Right;  . BREAST SURGERY Right 03/14/2016   Biopsy  . CAPSULECTOMY Right 09/13/2016   Procedure: RIGHT CAPSULECTOMY;  Surgeon: Irene Limbo, MD;  Location: [facility];  Service: Plastics;  Laterality: Right;  . CATARACT EXTRACTION, BILATERAL Bilateral 08/10/17 right eye, 08/31/17 left eye  . MASS EXCISION Left 02/28/2018   Path: benign.  Procedure: EXCISIONLEFT MEDIAL THIGH MASS ERAS PATHWAY;  Surgeon: Erroll Luna, MD;  Location: [facility];  Service: General;  Laterality: Left;  . PORTACATH PLACEMENT N/A 03/15/2016   Procedure: INSERTION PORT-A-CATH WITH [place];  Surgeon: Alphonsa Overall, MD;  Location: [facility];  Service: General;  Laterality: N/A;  . PORTACATH REMOVAL  07/2017  . surgical repair left ankle Left 2009   s/p Fall   . TONSILLECTOMY AND ADENOIDECTOMY  1948   Age 24    Family History  Problem Relation Age of Onset  . Stroke Mother   . Suicidality Father   . Stroke Brother   . Stroke Son       Current Outpatient Medications:  .  atorvastatin (LIPITOR) 80 MG tablet, Take 80 mg by mouth every evening. , Disp: , Rfl:  .  Cholecalciferol (VITAMIN D3) 125 MCG (5000 UT) TABS, Take by mouth., Disp: , Rfl:  .  gabapentin (NEURONTIN) 300 MG capsule, Take 1 capsule (300 mg total) by mouth 3 (three) times daily., Disp: 90 capsule, Rfl: 3 .  levothyroxine (SYNTHROID, LEVOTHROID) 112 MCG tablet, Take 1 tablet by mouth daily., Disp: , Rfl:  .  lisinopril (ZESTRIL) 30 MG tablet, Take 1 tablet (30 mg total) by mouth daily., Disp: 30 tablet, Rfl: 0 .  Menthol, Topical Analgesic, (BIOFREEZE EX), Apply topically as needed., Disp: , Rfl:  .  metFORMIN (GLUCOPHAGE-XR) 500 MG 24 hr tablet, Take 1,000 mg by mouth daily before supper. , Disp: , Rfl:  .  pyridoxine (B-6) 100 MG tablet, Take 100 mg by mouth daily. , Disp: , Rfl:  .  Rivaroxaban 15 & 20 MG TBPK, Follow package directions: Take one 15mg  tablet by mouth twice a day. On day 22, switch  to one 20mg  tablet once a day. Take with food., Disp: 51 each, Rfl: 0  EXAM:  VITALS per patient if applicable: Temp [id number] F (36.8 C) (Temporal)   Resp 16   Ht 5\' 7"  (1.702 m)   Wt 171 lb (77.6 kg)   SpO2 98%   BMI 26.78 kg/m    GENERAL: alert, oriented, appears well and in no acute distress  HEENT: atraumatic, conjunttiva clear, no obvious abnormalities on inspection of external nose and ears  NECK: normal movements of the head and neck  LUNGS: on inspection no signs of respiratory distress, breathing rate appears normal, no obvious gross SOB, gasping or wheezing  CV: no obvious cyanosis  MS: moves all visible extremities without noticeable abnormality  PSYCH/NEURO: pleasant and cooperative, no obvious depression or anxiety, speech and thought processing grossly intact  LABS: none today    Chemistry      Component Value Date/Time   NA 139 04/24/2019 1129  NA 136 05/01/2017 0914   K 4.8 04/24/2019 1129   K 4.5 05/01/2017 0914   CL 101 04/24/2019 1129   CO2 25 04/24/2019 1129   CO2 24 05/01/2017 0914   BUN 20 04/24/2019 1129   BUN 14.9 05/01/2017 0914   CREATININE 0.90 04/24/2019 1129   CREATININE 0.8 05/01/2017 0914      Component Value Date/Time   CALCIUM 9.7 04/24/2019 1129   CALCIUM 9.6 05/01/2017 0914   ALKPHOS 62 04/24/2019 1129   ALKPHOS 75 05/01/2017 0914   AST 35 04/24/2019 1129   AST 51 (H) 05/01/2017 0914   ALT 40 04/24/2019 1129   ALT 62 (H) 05/01/2017 0914   BILITOT 1.2 04/24/2019 1129   BILITOT 1.30 (H) 05/01/2017 0914     Lab Results  Component Value Date   WBC 4.6 04/24/2019   HGB 14.0 04/24/2019   HCT 42.3 04/24/2019   MCV 98.1 04/24/2019   PLT 148 (L) 04/24/2019   Lab Results  Component Value Date   TSH 16.530 (H) 05/21/2018   No results found for: CHOL, HDL, LDLCALC, LDLDIRECT, TRIG, CHOLHDL  ASSESSMENT AND PLAN:  Discussed the following assessment and plan:  1) Acute R leg DVT: saw Dr. Lindi Adie yesterday, no sign of breast  ca recurrence. Plans xarelto x 6 mo.  Pt has f/u with him next summer.  2) Chronic peripheral neuropathy pain + right glut/thigh pain->continue gabapentin at 600 mg qhs which is all she can tolerate.  Continue with plan for f/u with Dr. Tamala Julian.  3) HTN: The current medical regimen is effective;  continue present plan and medications. Recent cr/lytes good.  4) Anx/dep: she is at baseline, pushing through. She reconsider start of the fluoxetine she has.  Continue counseling at [facility].  -we discussed possible serious and likely etiologies, options for evaluation and workup, limitations of telemedicine visit vs in person visit, treatment, treatment risks and precautions. Pt prefers to treat via telemedicine empirically rather then risking or undertaking an in person visit at this moment. Patient agrees to seek prompt in person care if worsening, new symptoms arise, or if is not improving with treatment.   I discussed the assessment and treatment plan with the patient. The patient was provided an opportunity to ask questions and all were answered. The patient agreed with the plan and demonstrated an understanding of the instructions.   The patient was advised to call back or seek an in-person evaluation if the symptoms worsen or if the condition fails to improve as anticipated.  F/u: 54mo  Signed:  Crissie Sickles, MD           04/25/2019

## 2019-04-26 ENCOUNTER — Other Ambulatory Visit: Payer: Self-pay | Admitting: Family Medicine

## 2019-05-06 ENCOUNTER — Telehealth: Payer: Medicare Other | Admitting: Physician Assistant

## 2019-05-06 DIAGNOSIS — Z20822 Contact with and (suspected) exposure to covid-19: Secondary | ICD-10-CM

## 2019-05-06 NOTE — Progress Notes (Signed)
E-Visit for Corona Virus Screening   Your current symptoms could be consistent with the coronavirus.  Many health care providers can now test patients at their office but not all are.  [facility] has multiple testing sites. For information on our COVID testing locations and hours go to [email or web address]  Please quarantine yourself while awaiting your test results.  We are enrolling you in our [facility] for [place] . Daily you will receive a questionnaire within the [facility] website. Our COVID 19 response team willl be monitoriing your responses daily. Please continue good preventive care measures, including:  frequent hand-washing, avoid touching your face, cover coughs/sneezes, stay out of crowds and keep a 6 foot distance from others.    COVID-19 is a respiratory illness with symptoms that are similar to the flu. Symptoms are typically mild to moderate, but there have been cases of severe illness and death due to the virus. The following symptoms may appear 2-14 days after exposure: . Fever . Cough . Shortness of breath or difficulty breathing . Chills . Repeated shaking with chills . Muscle pain . Headache . Sore throat . New loss of taste or smell . Fatigue . Congestion or runny nose . Nausea or vomiting . Diarrhea  If you develop fever/cough/breathlessness, please stay home for 10 days with improving symptoms and until you have had 24 hours of no fever (without taking a fever reducer).  Go to the nearest hospital ED for assessment if fever/cough/breathlessness are severe or illness seems like a threat to life.  It is vitally important that if you feel that you have an infection such as this virus or any other virus that you stay home and away from places where you may spread it to others.  You should avoid contact with people age 76 and older.   You should wear a mask or cloth face covering over your nose and mouth if you must be around other  people or animals, including pets (even at home). Try to stay at least 6 feet away from other people. This will protect the people around you.  There is no specific medication to cure covid. We only have prescriptions to help ease some of the symptoms such as cough or runny nose.  Rarely, severely ill patients who are in the hospital with covid are being given medications that we think might help shorten the course of the illness. These medications cannot be prescribed over an E-visit and are usually reserved only for those who are ill enough to be hospitalized. Most people recover from covid on their own at home with lots of rest and fluids  You may also take acetaminophen (Tylenol) as needed for fever.   Reduce your risk of any infection by using the same precautions used for avoiding the common cold or flu:  [name] Kitchen Wash your hands often with soap and warm water for at least 20 seconds.  If soap and water are not readily available, use an alcohol-based hand sanitizer with at least 60% alcohol.  . If coughing or sneezing, cover your mouth and nose by coughing or sneezing into the elbow areas of your shirt or coat, into a tissue or into your sleeve (not your hands). . Avoid shaking hands with others and consider head nods or verbal greetings only. . Avoid touching your eyes, nose, or mouth with unwashed hands.  . Avoid close contact with people who are sick. . Avoid places or events with large numbers of people in one location,  like concerts or sporting events. . Carefully consider travel plans you have or are making. . If you are planning any travel outside or inside the [place], visit the CDC's Travelers' Health webpage for the latest health notices. . If you have some symptoms but not all symptoms, continue to monitor at home and seek medical attention if your symptoms worsen. . If you are having a medical emergency, call 911.  HOME CARE . Only take medications as instructed by your medical  team. . Drink plenty of fluids and get plenty of rest. . A steam or ultrasonic humidifier can help if you have congestion.   GET HELP RIGHT AWAY IF YOU HAVE EMERGENCY WARNING SIGNS** FOR COVID-19. If you or someone is showing any of these signs seek emergency medical care immediately. Call 911 or proceed to your closest emergency facility if: . You develop worsening high fever. . Trouble breathing . Bluish lips or face . Persistent pain or pressure in the chest . New confusion . Inability to wake or stay awake . You cough up blood. . Your symptoms become more severe  **This list is not all possible symptoms. Contact your medical provider for any symptoms that are sever or concerning to you.   MAKE SURE YOU   Understand these instructions.  Will watch your condition.  Will get help right away if you are not doing well or get worse.  Your e-visit answers were reviewed by a board certified advanced clinical practitioner to complete your personal care plan.  Depending on the condition, your plan could have included both over the counter or prescription medications.  If there is a problem please reply once you have received a response from your provider.  Your safety is important to [place].  If you have drug allergies check your prescription carefully.    You can use MyChart to ask questions about today's visit, request a non-urgent call back, or ask for a work or school excuse for 24 hours related to this e-Visit. If it has been greater than 24 hours you will need to follow up with your provider, or enter a new e-Visit to address those concerns. You will get an e-mail in the next two days asking about your experience.  I hope that your e-visit has been valuable and will speed your recovery. Thank you for using e-visits.   7 minutes spent on this chart

## 2019-05-07 ENCOUNTER — Other Ambulatory Visit: Payer: Self-pay

## 2019-05-07 ENCOUNTER — Ambulatory Visit: Payer: Medicare Other | Admitting: Family Medicine

## 2019-05-07 ENCOUNTER — Telehealth: Payer: Self-pay

## 2019-05-07 MED ORDER — GABAPENTIN 400 MG PO CAPS: 400.0000 mg | ORAL_CAPSULE | Freq: Every day | ORAL | 0 refills | Status: DC

## 2019-05-07 NOTE — Telephone Encounter (Signed)
Called patient in regards to covid screening for appointment. Patient states that she had a fever and chills/body aches yesterday but is fine today. Recommended that she reschedule per Dr. Tamala Julian as he would like for her to be symptom free for 72 hours. Patient would like something for pain until next appointment. Per a verbal from Dr. Tamala Julian he will increase gabapentin to 400mg  at night and she can use Tylenol during the day for pain. Patient voices understanding. Patient rescheduled.

## 2019-05-09 ENCOUNTER — Encounter: Payer: Self-pay | Admitting: Family Medicine

## 2019-05-16 ENCOUNTER — Encounter: Payer: Self-pay | Admitting: Family Medicine

## 2019-05-16 ENCOUNTER — Other Ambulatory Visit: Payer: Self-pay

## 2019-05-16 ENCOUNTER — Ambulatory Visit (INDEPENDENT_AMBULATORY_CARE_PROVIDER_SITE_OTHER): Payer: Medicare Other | Admitting: Family Medicine

## 2019-05-16 VITALS — BP 130/78 | HR 71 | Ht 67.0 in | Wt 173.0 lb

## 2019-05-16 DIAGNOSIS — M25551 Pain in right hip: Secondary | ICD-10-CM | POA: Diagnosis not present

## 2019-05-16 DIAGNOSIS — G5701 Lesion of sciatic nerve, right lower limb: Secondary | ICD-10-CM | POA: Diagnosis not present

## 2019-05-16 MED ORDER — KETOROLAC TROMETHAMINE 60 MG/2ML IM SOLN
60.0000 mg | Freq: Once | INTRAMUSCULAR | Status: AC
  Administered 2019-05-16: 60 mg via INTRAMUSCULAR

## 2019-05-16 MED ORDER — METHYLPREDNISOLONE ACETATE 80 MG/ML IJ SUSP
80.0000 mg | Freq: Once | INTRAMUSCULAR | Status: AC
  Administered 2019-05-16: 80 mg via INTRAMUSCULAR

## 2019-05-16 NOTE — Patient Instructions (Addendum)
  [address], 1st [address] [place], [place] [address] Phone [phone]  Good to see you PT [address] See me again in 5-6 weeks for piriformis injection

## 2019-05-16 NOTE — Assessment & Plan Note (Addendum)
Toradol and Depo-Medrol given today, patient declined injection into the muscle itself.  I do feel that there is a good chance that some of this is more secondary to a lumbar radiculopathy.  Patient though wants to continue with conservative therapy and is fairly against different medications.  He is taking gabapentin nightly.  We discussed daytime dosing of gabapentin which patient declined.  We discussed icing regimen, home exercise, patient is going to try to do these.  We discussed the possibility of formal physical therapy but secondary to the coronavirus I would like her to hold.  Patient will follow up with me again in 5 to 6 weeks for further evaluation and treatment.  Spent  25 minutes with patient face-to-face and had greater than 50% of counseling including as described above in assessment and plan.

## 2019-05-16 NOTE — Progress Notes (Signed)
[facility] [place], [place] [address] Phone: [phone] Subjective:   [name], am serving as a scribe for Dr. Hulan Saas.  This visit occurred during the SARS-CoV-2 public health emergency.  Safety protocols were in place, including screening questions prior to the visit, additional usage of staff PPE, and extensive cleaning of exam room while observing appropriate contact time as indicated for disinfecting solutions.    CC: Low back pain, leg pain  [id number]    04/23/2019 Patient has lower extremity edema noted positive squeeze test of the calf because significant increase in pain.  Some mild ultrasound showed some mild noncompressible veins and make it worrisome for potential deep venous thrombosis.  Patient's past medical history significant for breast cancer.  Patient will be sent for a Doppler.  If normal then we would further evaluate patient's right knee that did show a small effusion and a very small Baker's cyst.  Patient's pain now seems to be more posterior.  Will await further imaging to discuss treatment.  Update 05/16/2019 Marissa Hall is a 78 y.o. female coming in with complaint of right sided hip and back pain. States that she is in constant 8/10 pain and cannot concentrate. Feels a knot in the glute.  Patient states that her pain is fairly severe  Also having pain in the left knee, posterior aspect. Pain is cramping in nature and occurs throughout the day.      Past Medical History:  Diagnosis Date  . Acute DVT (deep venous thrombosis) ([place]) 04/23/2019   Right popliteal  . Anxiety and depression 1964   oncologist started duloxetine 12/2016  . Breast cancer ([place]) 03/14/2016   Clinical stage 2A: (triple neg): Right breast, upper inner quadrant, 03/2016.  Neoadjuvant chemo x 5 cycles,lumpectomy 4 mo later, then RT started 10/2016.  Adjuvant Xeloda [id number].  SWOG research trial pt 04/2017--pt randomized to  pembrolizumab immunotherapy.  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  [name] Kitchen Cataracts, bilateral 07/2017  . 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 Depression 1964   Patient states since age 87  . Diabetes mellitus with complication ([place]) [id number]   managed by endocrinology.  A1c Mar 12, 2018 was 7.0% at Dr. Shirlyn Goltz.   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  Excision done 02/2019  . GERD (gastroesophageal reflux disease) 2013  . Hyperlipidemia 1986  . Hypertension 2008  . Hypothyroidism 1988   Diagnosed in her 3s.  Managed by Endocrinologist  . Lumbar radiculopathy 04/2019   Dr. Tamala Julian to get plain films of LB and hip (considering MRI due to her hx of cancer)  . Osteoporosis 2015   pt states "osteopenia", but then says that she refused to take the rx med for this condition, so I suspect she had osteoporosis.  . Peripheral neuropathy 2017   Patient states diabetic neuropathy in feet prior to starting chemotherapy and then worsened by chemo.   [name] Kitchen TIA (transient ischemic attack) 03/26/2011   Past Surgical History:  Procedure Laterality Date  . ABDOMINAL HYSTERECTOMY  1972  . APPENDECTOMY  1972  . BREAST ENHANCEMENT SURGERY  1982  . BREAST IMPLANT REMOVAL Right 09/13/2016   Procedure: REMOVAL RIGHT BREAST IMPLANT;  Surgeon: Irene Limbo, MD;  Location: [facility];  Service: Plastics;  Laterality: Right;  .  BREAST LUMPECTOMY WITH RADIOACTIVE SEED AND SENTINEL LYMPH NODE BIOPSY Right 09/13/2016   Procedure: RIGHT BREAST LUMPECTOMY WITH RADIOACTIVE SEED X 2 AND SENTINEL LYMPH NODE BIOPSY;  Surgeon: Alphonsa Overall, MD;  Location: [facility];  Service: General;  Laterality: Right;  . BREAST SURGERY Right 03/14/2016   Biopsy  . CAPSULECTOMY Right 09/13/2016   Procedure: RIGHT CAPSULECTOMY;  Surgeon: Irene Limbo, MD;  Location: [facility];  Service: Plastics;  Laterality: Right;  . CATARACT EXTRACTION, BILATERAL Bilateral 08/10/17 right eye, 08/31/17 left eye  . MASS EXCISION Left 02/28/2018   Path: benign.  Procedure: EXCISIONLEFT MEDIAL THIGH MASS ERAS PATHWAY;  Surgeon: Erroll Luna, MD;  Location: [facility];  Service: General;  Laterality: Left;  . PORTACATH PLACEMENT N/A 03/15/2016   Procedure: INSERTION PORT-A-CATH WITH [place];  Surgeon: Alphonsa Overall, MD;  [facility];  Service: General;  Laterality: N/A;  . PORTACATH REMOVAL  07/2017  . surgical repair left ankle Left 2009   s/p Fall   . TONSILLECTOMY AND ADENOIDECTOMY  1948   Age 53   Social History   Socioeconomic History  . Marital status: Divorced    Spouse name: Not on file  . Number of children: Not on file  . Years of education: Not on file  . Highest education level: Not on file  Occupational History  . Not on file  Tobacco Use  . Smoking status: Former Smoker    Packs/day: 1.00    Types: Cigarettes    Quit date: 03/08/1999    Years since quitting: 20.2  . Smokeless tobacco: Never Used  Substance and Sexual Activity  . Alcohol use: Yes    Alcohol/week: 7.0 standard drinks    Types: 7 Shots of liquor per week    Comment:  daily  . Drug use: No  . Sexual activity: Yes    Partners: Male    Comment: 1st intercourse- 16, partners- 10+, current partner- 8 yrs  Other Topics Concern  . Not on file  Social History Narrative   Divorced, has a son who lives in [place].   Educ: college   Occup: Optometrist, still working part time.   Tob: quit 2000.     Alc: several times a week--1-2 glasses wine.   No drugs.   Social Determinants of Health   Financial Resource Strain:   . Difficulty of Paying Living Expenses: Not on file  Food Insecurity:   . Worried About Charity fundraiser in the Last Year: Not on file  . Ran Out of Food in the Last Year: Not on file  Transportation Needs:   .  Lack of Transportation (Medical): Not on file  . Lack of Transportation (Non-Medical): Not on file  Physical Activity:   . Days of Exercise per Week: Not on file  . Minutes of Exercise per Session: Not on file  Stress:   . Feeling of Stress : Not on file  Social Connections:   . Frequency of Communication with Friends and Family: Not on file  . Frequency of Social Gatherings with Friends and Family: Not on file  . Attends Religious Services: Not on file  . Active Member of Clubs or Organizations: Not on file  . Attends Archivist Meetings: Not on file  . Marital Status: Not on file   Allergies  Allergen Reactions  . Other Other (See Comments)    STEROIDS- emotional  . Penicillins Other (See Comments)    Unsure of  reaction, was 78 years old   Family History  Problem Relation Age of Onset  . Stroke Mother   . Suicidality Father   . Stroke Brother   . Stroke Son     Current Outpatient Medications (Endocrine & Metabolic):  .  levothyroxine (SYNTHROID, LEVOTHROID) 112 MCG tablet, Take 1 tablet by mouth daily. .  metFORMIN (GLUCOPHAGE-XR) 500 MG 24 hr tablet, Take 1,000 mg by mouth daily before supper.   Current Outpatient Medications (Cardiovascular):  .  atorvastatin (LIPITOR) 80 MG tablet, Take 80 mg by mouth every evening.  [name] Kitchen  lisinopril (ZESTRIL) 30 MG tablet, Take 1 tablet (30 mg total) by mouth daily.    Current Outpatient Medications (Hematological):  [name] Kitchen  Rivaroxaban 15 & 20 MG TBPK, Follow package directions: Take one 15mg  tablet by mouth twice a day. On day 22, switch to one 20mg  tablet once a day. Take with food.  Current Outpatient Medications (Other):  [name] Kitchen  Cholecalciferol (VITAMIN D3) 125 MCG (5000 UT) TABS, Take by mouth. .  gabapentin (NEURONTIN) 400 MG capsule, Take 1 capsule (400 mg total) by mouth at bedtime. .  Menthol, Topical Analgesic, (BIOFREEZE EX), Apply topically as needed. .  pyridoxine (B-6) 100 MG tablet, Take 100 mg by mouth daily.      Past medical history, social, surgical and family history all reviewed in electronic medical record.  No pertanent information unless stated regarding to the chief complaint.   Review of Systems:  No headache, visual changes, nausea, vomiting, diarrhea, constipation, dizziness, abdominal pain, skin rash, fevers, chills, night sweats, weight loss, swollen lymph nodes, body aches, joint swelling, muscle aches, chest pain, shortness of breath, mood changes.   Objective  Blood pressure 130/78, pulse 71, height 5\' 7"  (1.702 m), weight 173 lb (78.5 kg), SpO2 98 %.    General: No apparent distress alert and oriented x3 mood and affect normal, dressed appropriately.  HEENT: Pupils equal, extraocular movements intact  Respiratory: Patient's speak in full sentences and does not appear short of breath  Cardiovascular: No lower extremity edema, non tender, no erythema  Skin: Warm dry intact with no signs of infection or rash on extremities or on axial skeleton.  Abdomen: Soft nontender  Neuro: Cranial nerves II through XII are intact, neurovascularly intact in all extremities with 2+ DTRs and 2+ pulses.  Lymph: No lymphadenopathy of posterior or anterior cervical chain or axillae bilaterally.  Gait antalgic MSK:  tender with mild limited range of motion and good stability and symmetric strength and tone of shoulders, elbows, wrist, hip, and ankles bilaterally.  Arthritic changes of multiple joints.  Patient's pain is out of proportion to the amount of palpation in multiple areas.  Back exam does have some degenerative scoliosis and loss of lordosis.  Tightness with Corky Sox on the right side.  Negative straight leg test.  Patient has no pain with Corky Sox on the right.  Knee: Right valgus deformity noted.  Abnormal thigh to calf ratio.  Tender to palpation over medial and PF joint line.  ROM full in flexion and extension and lower leg rotation. instability with valgus force.  painful patellar compression.  Patellar glide with moderate crepitus. Patellar and quadriceps tendons unremarkable. Hamstring and quadriceps strength is normal. Contralateral knee shows arthritic changes    Impression and Recommendations:     This case required medical decision making of moderate complexity. The above documentation has been reviewed and is accurate and complete Lyndal Pulley, DO       Note:  This dictation was prepared with Dragon dictation along with smaller phrase technology. Any transcriptional errors that result from this process are unintentional.

## 2019-05-17 ENCOUNTER — Other Ambulatory Visit: Payer: Self-pay | Admitting: *Deleted

## 2019-05-17 ENCOUNTER — Other Ambulatory Visit: Payer: Self-pay | Admitting: Hematology and Oncology

## 2019-05-17 ENCOUNTER — Ambulatory Visit: Payer: Medicare Other | Admitting: Hematology and Oncology

## 2019-05-17 ENCOUNTER — Other Ambulatory Visit: Payer: Medicare Other

## 2019-05-17 MED ORDER — RIVAROXABAN 20 MG PO TABS: 20.0000 mg | ORAL_TABLET | Freq: Every day | ORAL | 1 refills | Status: DC

## 2019-05-17 MED FILL — XARELTO 20 MG TABLET: 20 | 30 days supply | Qty: 30 | Fill #0

## 2019-05-27 MED FILL — XARELTO 20 MG TABLET: 20 | 30 days supply | Qty: 30 | Fill #0

## 2019-05-28 ENCOUNTER — Other Ambulatory Visit: Payer: Self-pay | Admitting: Family Medicine

## 2019-06-04 ENCOUNTER — Ambulatory Visit: Payer: Medicare Other | Attending: Family Medicine | Admitting: Physical Therapy

## 2019-06-04 ENCOUNTER — Other Ambulatory Visit: Payer: Self-pay

## 2019-06-04 ENCOUNTER — Encounter: Payer: Self-pay | Admitting: Physical Therapy

## 2019-06-04 DIAGNOSIS — R2689 Other abnormalities of gait and mobility: Secondary | ICD-10-CM | POA: Diagnosis present

## 2019-06-04 DIAGNOSIS — M533 Sacrococcygeal disorders, not elsewhere classified: Secondary | ICD-10-CM

## 2019-06-04 DIAGNOSIS — M6281 Muscle weakness (generalized): Secondary | ICD-10-CM | POA: Diagnosis present

## 2019-06-04 DIAGNOSIS — M25551 Pain in right hip: Secondary | ICD-10-CM | POA: Diagnosis not present

## 2019-06-04 DIAGNOSIS — R293 Abnormal posture: Secondary | ICD-10-CM

## 2019-06-05 NOTE — Therapy (Signed)
[facility] [place], [place], [address] Phone: [phone]   Fax:  [phone]  Physical Therapy Evaluation  Patient Details  Name: Marissa Hall MRN: [id number] Date of Birth: [birth year] Referring Provider (PT): Dr. Hulan Saas    Encounter Date: 06/04/2019  PT End of Session - 06/04/19 1659    Visit Number  1    Number of Visits  16    Date for PT Re-Evaluation  07/30/19    PT Start Time  1620    PT Stop Time  1708    PT Time Calculation (min)  48 min    Activity Tolerance  Patient tolerated treatment well    Behavior During Therapy  [facility] for tasks assessed/performed       Past Medical History:  Diagnosis Date  . Acute DVT (deep venous thrombosis) ([facility]) 04/23/2019   Right popliteal  . Anxiety and depression 1964   oncologist started duloxetine 12/2016  . Breast cancer ([place]) 03/14/2016   Clinical stage 2A: (triple neg): Right breast, upper inner quadrant, 03/2016.  Neoadjuvant chemo x 5 cycles,lumpectomy 4 mo later, then RT started 10/2016.  Adjuvant Xeloda [id number].  SWOG research trial pt 04/2017--pt randomized to pembrolizumab immunotherapy.  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  [name] Kitchen Cataracts, bilateral 07/2017  . 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 Depression 1964   Patient states since age 32  . Diabetes mellitus with complication ([place]) [id number]   managed by endocrinology.  A1c Mar 12, 2018 was 7.0% at Dr. Shirlyn Goltz.   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  Excision done 02/2019  . GERD (gastroesophageal reflux disease) 2013  . Hyperlipidemia 1986  . Hypertension 2008  . Hypothyroidism 1988   Diagnosed in her 30s.  Managed by Endocrinologist  . Lumbar radiculopathy 04/2019   Dr. Tamala Julian to get plain films of LB and hip (considering  MRI due to her hx of cancer)  . Osteoporosis 2015   pt states "osteopenia", but then says that she refused to take the rx med for this condition, so I suspect she had osteoporosis.  . Peripheral neuropathy 2017   Patient states diabetic neuropathy in feet prior to starting chemotherapy and then worsened by chemo.   [name] Kitchen TIA (transient ischemic attack) 03/26/2011    Past Surgical History:  Procedure Laterality Date  . ABDOMINAL HYSTERECTOMY  1972  . APPENDECTOMY  1972  . BREAST ENHANCEMENT SURGERY  1982  . BREAST IMPLANT REMOVAL Right 09/13/2016   Procedure: REMOVAL RIGHT BREAST IMPLANT;  Surgeon: Irene Limbo, MD;  Location: [facility];  Service: Plastics;  Laterality: Right;  . BREAST LUMPECTOMY WITH RADIOACTIVE SEED AND SENTINEL LYMPH NODE BIOPSY Right 09/13/2016   Procedure: RIGHT BREAST LUMPECTOMY WITH RADIOACTIVE SEED X 2 AND SENTINEL LYMPH NODE BIOPSY;  Surgeon: Alphonsa Overall, MD;  Location: [facility];  Service: General;  Laterality: Right;  . BREAST SURGERY Right 03/14/2016   Biopsy  . CAPSULECTOMY Right 09/13/2016   Procedure: RIGHT CAPSULECTOMY;  Surgeon: Irene Limbo, MD;  Location: [facility];  Service: Plastics;  Laterality: Right;  . CATARACT EXTRACTION, BILATERAL Bilateral 08/10/17 right eye, 08/31/17 left eye  . MASS EXCISION Left 02/28/2018   Path: benign.  Procedure: EXCISIONLEFT MEDIAL THIGH MASS ERAS PATHWAY;  Surgeon: Erroll Luna, MD;  Location:  [facility];  Service: General;  Laterality: Left;  . PORTACATH PLACEMENT N/A 03/15/2016   Procedure: INSERTION PORT-A-CATH WITH [place];  Surgeon: Alphonsa Overall, MD;  [facility];  Service: General;  Laterality: N/A;  . PORTACATH REMOVAL  07/2017  . surgical repair left ankle Left 2009   s/p Fall   . TONSILLECTOMY AND ADENOIDECTOMY  1948   Age 49    There were no vitals filed for this visit.   Subjective Assessment - 06/04/19 1628    Subjective  Pt reports she  is in pain 24/7 in her Rt hip, back.   She had a blood clot in her leg as she dropped a large window on her leg.  She is on blood thinners now but can't take Advil as a result.  She has Rt sided back and hip pain and Rt knee weakness, pain. She has difficulty sleeping, sitting, standing, walking and relies on her UE for changing positions.  Pain radiates to Rt thigh and Rt calf when pain is severe.  Gets hot shooting pain in Rt foot occaisonally.    Pertinent History  Neuropathy, R knee pain.    Limitations  Sitting    How long can you sit comfortably?  shifts frequently in chair    How long can you walk comfortably?  walking is better than sitting    Diagnostic tests  XR mild degenerative changes 10/20    Patient Stated Goals  Patient would like to get out of this pain.    Currently in Pain?  Yes    Pain Score  3     Pain Location  Hip    Pain Orientation  Right    Pain Descriptors / Indicators  Sore    Pain Type  Chronic pain    Pain Radiating Towards  Rt leg    Pain Onset  More than a month ago    Pain Frequency  Constant    Aggravating Factors   not sure, sitting mostly    Pain Relieving Factors  changing position, Tylenol , heating    Effect of Pain on Daily Activities  pt is miserable in pain most of the time, sleep is disturbed    Multiple Pain Sites  No         [facility] PT Assessment - 06/05/19 0001      Assessment   Medical Diagnosis  back and hip pain     Referring Provider (PT)  Dr. Hulan Saas     Onset Date/Surgical Date  --   chronic    Prior Therapy  Yes       Precautions   Precautions  Other (comment)    Precaution Comments  on Xarelto, history of CA      Restrictions   Weight Bearing Restrictions  No      Balance Screen   Has the patient fallen in the past 6 months  No   very cautious and stumbles at times   Has the patient had a decrease in activity level because of a fear of falling?   Yes    Is the patient reluctant to leave their home because of a fear  of falling?   No      Home Social worker  Private residence    Living Arrangements  Alone    Available Help at Discharge  Friend(s)    Type of [place] to enter  Entrance Stairs-Number of Steps  3    Entrance Stairs-Rails  Right    Home Layout  One level    Additional Comments  stairs to basement       Prior Function   Level of Independence  Independent    Vocation  Full time employment    Leisure  dogs, friends      Cognition   Overall Cognitive Status  Within Functional Limits for tasks assessed      Observation/Other Assessments   Focus on Therapeutic Outcomes (FOTO)   54%      Sensation   Light Touch  Appears Intact    Additional Comments  history of neuropathy      Posture/Postural Control   Posture/Postural Control  Postural limitations    Postural Limitations  Rounded Shoulders;Forward head    Posture Comments  high Rt hip/pelvis      AROM   Lumbar Flexion  pain, 25%    Lumbar Extension  pain 50%     Lumbar - Right Side Bend  50%    Lumbar - Left Side Bend  50%    Lumbar - Right Rotation  25%    Lumbar - Left Rotation  25%      Strength   Right Hip Flexion  4/5    Left Hip Flexion  4+/5    Right Knee Flexion  4+/5    Right Knee Extension  4+/5    Left Knee Flexion  4+/5    Left Knee Extension  4+/5      Flexibility   Hamstrings  90/90 Rt lacks 55, Lt. 45 deg       Palpation   Palpation comment  painful bilateral lumbar paraspinals (L5) and Rt piriformis, spasm       Special Tests   Other special tests  back pain at 50-55 deg SLR on Rt                 Objective measurements completed on examination: See above findings.      PT Education - 06/05/19 1108    Education Details  PT/POC, HEP stretching review    Person(s) Educated  Patient    Methods  Explanation;Demonstration;Handout    Comprehension  Verbalized understanding;Returned demonstration       PT Short Term Goals - 06/05/19  1108      PT SHORT TERM GOAL #1   Title  Pt will be I with HEP for hip and back flexibilty, strength    Time  4    Period  Weeks    Status  New    Target Date  07/02/19      PT SHORT TERM GOAL #2   Title  Patient will demonstrate full pain free right hip AROM    Time  4    Period  Weeks    Status  New    Target Date  07/02/19      PT SHORT TERM GOAL #3   Title  Pt will complete balance screen and set goal for improvement (DGI)    Time  4    Period  Weeks    Status  New    Target Date  07/02/19      PT SHORT TERM GOAL #4   Title  Pt will report pain becoming more intermittent with normal daily activities    Time  4    Period  Weeks    Status  New    Target Date  07/02/19  PT Long Term Goals - 06/05/19 1111      PT LONG TERM GOAL #1   Title  Patient will sleep through the night without increased pain , about 50% of the time    Time  8    Period  Weeks    Status  New    Target Date  07/30/19      PT LONG TERM GOAL #2   Title  Pt will be able to walk through [facility] for 45 min with pain in back, hip no more than 3/10.    Time  8    Period  Weeks    Status  New    Target Date  07/30/19      PT LONG TERM GOAL #3   Title  Patieatient witll demonstrate a 42% limitation on FOTO    Baseline  54% limited    Time  8    Period  Weeks    Status  New    Target Date  07/30/19      PT LONG TERM GOAL #4   Title  Pt will become more aware of posture, lifting as she completes ADLs, work tasks (sitting less) and walks    Time  8    Period  Weeks    Status  New    Target Date  07/30/19             Plan - 06/05/19 1113    Clinical Impression Statement  Patient is familiar to me from previous PT.  Her eval is of mod complexity, as her symptoms are more constant, more severe and more debilitating than ever.  She shows an abnormal pelvic symmetry, weakness and in particular, Rt piriformis is in spasm.  She reports a decline in her gait, balance.  She has recently  had in injury ton her Rt leg and so her Rt knee is painful as well.  She has done well with PT prior, so I hope than she can find some benefit again, even if it is to make her more comfortable for ADLs and work.    Personal Factors and Comorbidities  Age;Comorbidity 1;Comorbidity 2;Past/Current Experience;Time since onset of injury/illness/exacerbation    Comorbidities  DVT 04/23/2019, Breast cancer, neuropathy, osteoporosis, depression    Examination-Activity Limitations  Squat;Stairs;Lift;Bed Mobility;Bend;Locomotion Level;Stand;Transfers;Sleep;Sit    Examination-Participation Restrictions  Shop;Cleaning;Yard Work;Meal Prep;Community Activity;Interpersonal Relationship    Stability/Clinical Decision Making  Evolving/Moderate complexity    Clinical Decision Making  Moderate    Rehab Potential  Good    PT Frequency  2x / week    PT Duration  8 weeks    PT Treatment/Interventions  ADLs/Self Care Home Management;Electrical Stimulation;Therapeutic activities;Patient/family education;Therapeutic exercise;Moist Heat;Cryotherapy;Stair training;Neuromuscular re-education;Manual techniques;Dry needling;Balance training;Gait training;Taping;Functional mobility training    PT Next Visit Plan  check HEP, address RT QL/hip hike, LAD and manual to Rt hip /back    PT Home Exercise Plan  bridge, PPT, hip abd, hamstring and piriformis    Consulted and Agree with Plan of Care  Patient       Patient will benefit from skilled therapeutic intervention in order to improve the following deficits and impairments:  Increased fascial restricitons, Pain, Postural dysfunction, Decreased mobility, Decreased endurance, Decreased range of motion, Decreased strength, Impaired flexibility, Difficulty walking, Decreased balance  Visit Diagnosis: Pain in right hip  Other abnormalities of gait and mobility  Muscle weakness (generalized)  Abnormal posture  Sacroiliac joint pain     Problem List Patient Active Problem  List  Diagnosis Date Noted  . Piriformis syndrome of right side 05/16/2019  . Lower extremity pain, right 04/23/2019  . DVT (deep venous thrombosis) ([place]) 04/23/2019  . Lumbar radiculopathy, right 04/02/2019  . Other long term (current) drug therapy 06/14/2017  . Anxiety and depression   . History of breast cancer 03/02/2017  . History of therapeutic radiation 03/02/2017  . Breast pain, right 10/03/2016  . Type 2 diabetes mellitus ([place]) 08/23/2016  . Hypertension associated with diabetes ([place]) 08/23/2016  . Hypothyroidism 08/23/2016  . Port catheter in place 05/09/2016  . Peripheral neuropathy 05/09/2016  . Breast cancer of upper-inner quadrant of right female breast ([place]) 03/09/2016  . Major depressive disorder, recurrent ([place]) 02/27/2014  . Uncomplicated alcohol dependence ([place]) 02/19/2014    Marissa Hall 06/05/2019, 11:25 AM  [facility] [address] [place], [place], [address] Phone: [phone]   Fax:  [phone]  Name: Marissa Hall MRN: [id number] Date of Birth: 1940-10-09   Raeford Razor, PT 06/05/19 11:25 AM Phone: [phone] Fax: [phone]

## 2019-06-06 ENCOUNTER — Ambulatory Visit: Payer: Medicare Other | Admitting: Physical Therapy

## 2019-06-06 ENCOUNTER — Encounter: Payer: Self-pay | Admitting: Physical Therapy

## 2019-06-06 ENCOUNTER — Other Ambulatory Visit: Payer: Self-pay

## 2019-06-06 DIAGNOSIS — R293 Abnormal posture: Secondary | ICD-10-CM

## 2019-06-06 DIAGNOSIS — M25551 Pain in right hip: Secondary | ICD-10-CM | POA: Diagnosis not present

## 2019-06-06 DIAGNOSIS — M6281 Muscle weakness (generalized): Secondary | ICD-10-CM

## 2019-06-06 DIAGNOSIS — M533 Sacrococcygeal disorders, not elsewhere classified: Secondary | ICD-10-CM

## 2019-06-06 DIAGNOSIS — R2689 Other abnormalities of gait and mobility: Secondary | ICD-10-CM

## 2019-06-06 NOTE — Therapy (Signed)
[facility] [place], [place], [address] Phone: [phone]   Fax:  [phone]  Physical Therapy Treatment  Patient Details  Name: Marissa Hall MRN: [id number] Date of Birth: 1941/05/01 Referring Provider (PT): Dr. Hulan Saas    Encounter Date: 06/06/2019  PT End of Session - 06/06/19 1633    Visit Number  2    Number of Visits  16    Date for PT Re-Evaluation  07/30/19    Authorization Type  MCR, BCBS secondary, progress note by visit 10    PT Start Time  1545    PT Stop Time  1630   time spent dry needling not included in direct minutes   PT Time Calculation (min)  45 min    Activity Tolerance  Patient tolerated treatment well    Behavior During Therapy  [facility] for tasks assessed/performed       Past Medical History:  Diagnosis Date  . Acute DVT (deep venous thrombosis) ([place]) 04/23/2019   Right popliteal  . Anxiety and depression 1964   oncologist started duloxetine 12/2016  . Breast cancer ([place]) 03/14/2016   Clinical stage 2A: (triple neg): Right breast, upper inner quadrant, 03/2016.  Neoadjuvant chemo x 5 cycles,lumpectomy 4 mo later, then RT started 10/2016.  Adjuvant Xeloda [id number].  SWOG research trial pt 04/2017--pt randomized to pembrolizumab immunotherapy.  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  [name] Kitchen Cataracts, bilateral 07/2017  . 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 Depression 1964   Patient states since age 66  . Diabetes mellitus with complication ([place]) [id number]   managed by endocrinology.  A1c Mar 12, 2018 was 7.0% at Dr. Shirlyn Goltz.   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  Excision done 02/2019  . GERD (gastroesophageal reflux disease) 2013  . Hyperlipidemia 1986  . Hypertension 2008  . Hypothyroidism 1988   Diagnosed in her  65s.  Managed by Endocrinologist  . Lumbar radiculopathy 04/2019   Dr. Tamala Julian to get plain films of LB and hip (considering MRI due to her hx of cancer)  . Osteoporosis 2015   pt states "osteopenia", but then says that she refused to take the rx med for this condition, so I suspect she had osteoporosis.  . Peripheral neuropathy 2017   Patient states diabetic neuropathy in feet prior to starting chemotherapy and then worsened by chemo.   [name] Kitchen TIA (transient ischemic attack) 03/26/2011    Past Surgical History:  Procedure Laterality Date  . ABDOMINAL HYSTERECTOMY  1972  . APPENDECTOMY  1972  . BREAST ENHANCEMENT SURGERY  1982  . BREAST IMPLANT REMOVAL Right 09/13/2016   Procedure: REMOVAL RIGHT BREAST IMPLANT;  Surgeon: Irene Limbo, MD;  Location: [place];  Service: Plastics;  Laterality: Right;  . BREAST LUMPECTOMY WITH RADIOACTIVE SEED AND SENTINEL LYMPH NODE BIOPSY Right 09/13/2016   Procedure: RIGHT BREAST LUMPECTOMY WITH RADIOACTIVE SEED X 2 AND SENTINEL LYMPH NODE BIOPSY;  Surgeon: Alphonsa Overall, MD;  Location: [place];  Service: General;  Laterality: Right;  . BREAST SURGERY Right 03/14/2016   Biopsy  . CAPSULECTOMY Right 09/13/2016   Procedure: RIGHT CAPSULECTOMY;  Surgeon: Irene Limbo, MD;  Location: [place];  Service: Plastics;  Laterality: Right;  . CATARACT EXTRACTION, BILATERAL Bilateral 08/10/17 right eye, 08/31/17 left eye  .  MASS EXCISION Left 02/28/2018   Path: benign.  Procedure: EXCISIONLEFT MEDIAL THIGH MASS ERAS PATHWAY;  Surgeon: Erroll Luna, MD;  Location: [place];  Service: General;  Laterality: Left;  . PORTACATH PLACEMENT N/A 03/15/2016   Procedure: INSERTION PORT-A-CATH WITH [place];  Surgeon: Alphonsa Overall, MD;  [facility];  Service: General;  Laterality: N/A;  . PORTACATH REMOVAL  07/2017  . surgical repair left ankle Left 2009   s/p Fall   . TONSILLECTOMY AND ADENOIDECTOMY  1948   Age  75    There were no vitals filed for this visit.  Subjective Assessment - 06/06/19 1547    Subjective  Some days better than others. Pain not as bad today. Pt. got "foam topper" for bed which has helped with decreased sleep disturbance.    Currently in Pain?  Yes    Pain Score  2     Pain Location  Hip    Pain Orientation  Right    Pain Descriptors / Indicators  Sore;Aching    Pain Type  Chronic pain    Pain Radiating Towards  Right leg    Pain Onset  More than a month ago    Pain Frequency  Constant    Aggravating Factors   sitting    Pain Relieving Factors  change                       [facility] Adult PT Treatment/Exercise - 06/06/19 0001      Exercises   Exercises  Lumbar;Knee/Hip      Lumbar Exercises: Stretches   Other Lumbar Stretch Exercise  manual R QL stretch in left sidelying 3x30 sec      Lumbar Exercises: Supine   Pelvic Tilt  15 reps    Clam  15 reps    Clam Limitations  red band    Bridge  15 reps    Bridge Limitations  partial bridge      Knee/Hip Exercises: Hydrologist  Right;3 reps;30 seconds    Piriformis Stretch  Right;3 reps;30 seconds    Other Knee/Hip Stretches  Right glut stretch (single knee to chest with slight hip adduction) 3x30 sec      Manual Therapy   Manual Therapy  Joint mobilization;Soft tissue mobilization    Joint Mobilization  Right hip LAD grade I-III    Soft tissue mobilization  Right glut, posterolateral hip       Trigger Point Dry Needling - 06/06/19 0001    Consent Given?  Yes    Education Handout Provided  Yes    Muscles Treated Back/Hip  Gluteus medius;Gluteus maximus;Piriformis    Gluteus Medius Response  Twitch response elicited    Gluteus Maximus Response  Twitch response elicited    Piriformis Response  Twitch response elicited           PT Education - 06/06/19 1632    Education Details  dry needling, tennis ball release right glut/piriformis    Person(s) Educated   Patient    Methods  Explanation;Verbal cues;Handout    Comprehension  Verbalized understanding       PT Short Term Goals - 06/05/19 1108      PT SHORT TERM GOAL #1   Title  Pt will be I with HEP for hip and back flexibilty, strength    Time  4    Period  Weeks    Status  New    Target Date  07/02/19      PT SHORT TERM GOAL #2   Title  Patient will demonstrate full pain free right hip AROM    Time  4    Period  Weeks    Status  New    Target Date  07/02/19      PT SHORT TERM GOAL #3   Title  Pt will complete balance screen and set goal for improvement (DGI)    Time  4    Period  Weeks    Status  New    Target Date  07/02/19      PT SHORT TERM GOAL #4   Title  Pt will report pain becoming more intermittent with normal daily activities    Time  4    Period  Weeks    Status  New    Target Date  07/02/19        PT Long Term Goals - 06/05/19 1111      PT LONG TERM GOAL #1   Title  Patient will sleep through the night without increased pain , about 50% of the time    Time  8    Period  Weeks    Status  New    Target Date  07/30/19      PT LONG TERM GOAL #2   Title  Pt will be able to walk through [facility] for 45 min with pain in back, hip no more than 3/10.    Time  8    Period  Weeks    Status  New    Target Date  07/30/19      PT LONG TERM GOAL #3   Title  Patieatient witll demonstrate a 42% limitation on FOTO    Baseline  54% limited    Time  8    Period  Weeks    Status  New    Target Date  07/30/19      PT LONG TERM GOAL #4   Title  Pt will become more aware of posture, lifting as she completes ADLs, work tasks (sitting less) and walks    Time  8    Period  Weeks    Status  New    Target Date  07/30/19            Plan - 06/06/19 1634    Clinical Impression Statement  Trial dry needling to address gluteal region myofascial pain with good post-tx. response for decreased tightness. Tx. focus otherwise hip/QL and lumbar strengthening, stretches and  STM to hip region with good tolerance. Given symptom chronicity and comorbidities expect progress will be gradual so will continue to monitor status.    Personal Factors and Comorbidities  Age;Comorbidity 1;Comorbidity 2;Past/Current Experience;Time since onset of injury/illness/exacerbation    Comorbidities  DVT 04/23/2019, Breast cancer, neuropathy, osteoporosis, depression    Examination-Activity Limitations  Squat;Stairs;Lift;Bed Mobility;Bend;Locomotion Level;Stand;Transfers;Sleep;Sit    Examination-Participation Restrictions  Shop;Cleaning;Yard Work;Meal Prep;Community Activity;Interpersonal Relationship    Stability/Clinical Decision Making  Evolving/Moderate complexity    Clinical Decision Making  Moderate    Rehab Potential  Good    PT Frequency  2x / week    PT Duration  8 weeks    PT Treatment/Interventions  ADLs/Self Care Home Management;Electrical Stimulation;Therapeutic activities;Patient/family education;Therapeutic exercise;Moist Heat;Cryotherapy;Stair training;Neuromuscular re-education;Manual techniques;Dry needling;Balance training;Gait training;Taping;Functional mobility training    PT Next Visit Plan  check response dry needling, continue LAD, manual to hip, QL and hip/glut stretches, strengthening as tolerated    PT Home Exercise  Plan  bridge, PPT, hip abd, hamstring and piriformis    Consulted and Agree with Plan of Care  Patient       Patient will benefit from skilled therapeutic intervention in order to improve the following deficits and impairments:  Increased fascial restricitons, Pain, Postural dysfunction, Decreased mobility, Decreased endurance, Decreased range of motion, Decreased strength, Impaired flexibility, Difficulty walking, Decreased balance  Visit Diagnosis: Pain in right hip  Other abnormalities of gait and mobility  Muscle weakness (generalized)  Abnormal posture  Sacroiliac joint pain     Problem List Patient Active Problem List    Diagnosis Date Noted  . Piriformis syndrome of right side 05/16/2019  . Lower extremity pain, right 04/23/2019  . DVT (deep venous thrombosis) ([place]) 04/23/2019  . Lumbar radiculopathy, right 04/02/2019  . Other long term (current) drug therapy 06/14/2017  . Anxiety and depression   . History of breast cancer 03/02/2017  . History of therapeutic radiation 03/02/2017  . Breast pain, right 10/03/2016  . Type 2 diabetes mellitus ([place]) 08/23/2016  . Hypertension associated with diabetes ([place]) 08/23/2016  . Hypothyroidism 08/23/2016  . Port catheter in place 05/09/2016  . Peripheral neuropathy 05/09/2016  . Breast cancer of upper-inner quadrant of right female breast ([place]) 03/09/2016  . Major depressive disorder, recurrent ([place]) 02/27/2014  . Uncomplicated alcohol dependence ([place]) 02/19/2014    Beaulah Dinning, PT, DPT 06/06/19 4:40 PM  [facility] [address] [place], [place], [address] Phone: [phone]   Fax:  [phone]  Name: Marissa Hall MRN: [id number] Date of Birth: 03-Sep-1940

## 2019-06-10 ENCOUNTER — Other Ambulatory Visit: Payer: Self-pay

## 2019-06-10 ENCOUNTER — Ambulatory Visit: Payer: Medicare Other | Attending: Family Medicine | Admitting: Physical Therapy

## 2019-06-10 DIAGNOSIS — M6281 Muscle weakness (generalized): Secondary | ICD-10-CM

## 2019-06-10 DIAGNOSIS — R2689 Other abnormalities of gait and mobility: Secondary | ICD-10-CM | POA: Diagnosis not present

## 2019-06-10 DIAGNOSIS — M25551 Pain in right hip: Secondary | ICD-10-CM | POA: Diagnosis not present

## 2019-06-10 DIAGNOSIS — M533 Sacrococcygeal disorders, not elsewhere classified: Secondary | ICD-10-CM | POA: Diagnosis not present

## 2019-06-10 DIAGNOSIS — R293 Abnormal posture: Secondary | ICD-10-CM

## 2019-06-10 NOTE — Therapy (Signed)
[place], [place], [address] Phone: [phone]   Fax:  [phone]  Physical Therapy Treatment  Patient Details  Name: Marissa Hall MRN: [id number] Date of Birth: 10/14/1940 Referring Provider (PT): Dr. Hulan Saas    Encounter Date: 06/10/2019  PT End of Session - 06/10/19 1015    Visit Number  3    Number of Visits  16    Date for PT Re-Evaluation  07/30/19    Authorization Type  MCR, BCBS secondary, progress note by visit 10    PT Start Time  1005    PT Stop Time  1100    PT Time Calculation (min)  55 min    Activity Tolerance  Patient tolerated treatment well    Behavior During Therapy  [facility] for tasks assessed/performed       Past Medical History:  Diagnosis Date  . Acute DVT (deep venous thrombosis) ([place]) 04/23/2019   Right popliteal  . Anxiety and depression 1964   oncologist started duloxetine 12/2016  . Breast cancer ([place]) 03/14/2016   Clinical stage 2A: (triple neg): Right breast, upper inner quadrant, 03/2016.  Neoadjuvant chemo x 5 cycles,lumpectomy 4 mo later, then RT started 10/2016.  Adjuvant Xeloda [id number].  SWOG research trial pt 04/2017--pt randomized to pembrolizumab immunotherapy.  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  [name] Kitchen Cataracts, bilateral 07/2017  . 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 Depression 1964   Patient states since age 72  . Diabetes mellitus with complication ([place]) [id number]   managed by endocrinology.  A1c Mar 12, 2018 was 7.0% at Dr. Shirlyn Goltz.   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  Excision done 02/2019  . GERD (gastroesophageal reflux disease) 2013  . Hyperlipidemia 1986  . Hypertension 2008  . Hypothyroidism 1988   Diagnosed in her 47s.  Managed by Endocrinologist  . Lumbar radiculopathy  04/2019   Dr. Tamala Julian to get plain films of LB and hip (considering MRI due to her hx of cancer)  . Osteoporosis 2015   pt states "osteopenia", but then says that she refused to take the rx med for this condition, so I suspect she had osteoporosis.  . Peripheral neuropathy 2017   Patient states diabetic neuropathy in feet prior to starting chemotherapy and then worsened by chemo.   [name] Kitchen TIA (transient ischemic attack) 03/26/2011    Past Surgical History:  Procedure Laterality Date  . ABDOMINAL HYSTERECTOMY  1972  . APPENDECTOMY  1972  . BREAST ENHANCEMENT SURGERY  1982  . BREAST IMPLANT REMOVAL Right 09/13/2016   Procedure: REMOVAL RIGHT BREAST IMPLANT;  Surgeon: Irene Limbo, MD;  Location: [place];  Service: Plastics;  Laterality: Right;  . BREAST LUMPECTOMY WITH RADIOACTIVE SEED AND SENTINEL LYMPH NODE BIOPSY Right 09/13/2016   Procedure: RIGHT BREAST LUMPECTOMY WITH RADIOACTIVE SEED X 2 AND SENTINEL LYMPH NODE BIOPSY;  Surgeon: Alphonsa Overall, MD;  Location: [place];  Service: General;  Laterality: Right;  . BREAST SURGERY Right 03/14/2016   Biopsy  . CAPSULECTOMY Right 09/13/2016   Procedure: RIGHT CAPSULECTOMY;  Surgeon: Irene Limbo, MD;  Location: [place];  Service: Plastics;  Laterality: Right;  . CATARACT EXTRACTION, BILATERAL Bilateral 08/10/17 right eye, 08/31/17 left eye  . MASS EXCISION Left 02/28/2018   Path: benign.  Procedure: EXCISIONLEFT MEDIAL THIGH MASS ERAS PATHWAY;  Surgeon: Erroll Luna, MD;  Location: [place];  Service: General;  Laterality: Left;  . PORTACATH PLACEMENT N/A 03/15/2016   Procedure: INSERTION PORT-A-CATH WITH [place];  Surgeon: Alphonsa Overall, MD;  [facility];  Service: General;  Laterality: N/A;  . PORTACATH REMOVAL  07/2017  . surgical repair left ankle Left 2009   s/p Fall   . TONSILLECTOMY AND ADENOIDECTOMY  1948   Age 52    There were no vitals filed for this  visit.  Subjective Assessment - 06/10/19 1013    Subjective  Rt knee has a Baker's Cyst. I cant do my exercises on the floor. I use a pillow under my hips in my seat at work. Liked the dry needling.    Currently in Pain?  Yes    Pain Score  4     Pain Location  Knee    Pain Orientation  Right    Pain Descriptors / Indicators  Sore    Pain Type  Chronic pain    Pain Onset  More than a month ago         [facility] Adult PT Treatment/Exercise - 06/10/19 0001      Dynamic Gait Index   Level Surface  Normal    Change in Gait Speed  Mild Impairment    Gait with Horizontal Head Turns  Moderate Impairment    Gait with Vertical Head Turns  Moderate Impairment    Gait and Pivot Turn  Normal    Step Over Obstacle  Normal    Step Around Obstacles  Normal    Steps  Normal    Total Score  19      Knee/Hip Exercises: Stretches   Passive Hamstring Stretch  Right;3 reps;30 seconds    Passive Hamstring Stretch Limitations  seated     Piriformis Stretch  Right;3 reps;30 seconds    Gastroc Stretch  Both;3 reps      Knee/Hip Exercises: Aerobic   Nustep  L5 UE and LE 6 min       Modalities   Modalities  Moist Heat      Moist Heat Therapy   Number Minutes Moist Heat  10 Minutes    Moist Heat Location  Hip      Manual Therapy   Manual therapy comments  post capsule stretch Rt hip x 3     Joint Mobilization  Right hip LAD grade I-III        PT Short Term Goals - 06/10/19 1117      PT SHORT TERM GOAL #1   Title  Pt will be I with HEP for hip and back flexibilty, strength    Status  On-going      PT SHORT TERM GOAL #2   Title  Patient will demonstrate full pain free right hip AROM    Status  On-going      PT SHORT TERM GOAL #3   Title  Pt will complete balance screen and set goal for improvement (DGI)    Status  Achieved      PT SHORT TERM GOAL #4   Title  Pt will report pain becoming more intermittent with normal daily activities    Status  On-going        PT Long Term Goals  - 06/05/19 1111      PT LONG TERM GOAL #1   Title  Patient will sleep through the night without increased pain ,  about 50% of the time    Time  8    Period  Weeks    Status  New    Target Date  07/30/19      PT LONG TERM GOAL #2   Title  Pt will be able to walk through [facility] for 45 min with pain in back, hip no more than 3/10.    Time  8    Period  Weeks    Status  New    Target Date  07/30/19      PT LONG TERM GOAL #3   Title  Patieatient witll demonstrate a 42% limitation on FOTO    Baseline  54% limited    Time  8    Period  Weeks    Status  New    Target Date  07/30/19      PT LONG TERM GOAL #4   Title  Pt will become more aware of posture, lifting as she completes ADLs, work tasks (sitting less) and walks    Time  8    Period  Weeks    Status  New    Target Date  07/30/19            Plan - 06/10/19 1100    Clinical Impression Statement  addressed patient's concerns about her balance challenges today.  She definitely had difficulty with maintaining a straight path with head turns. I feel the issue could be a product of her neuropathy.  Post knee tightness due to Baker's cyst . No Rt hip pain at all during session.  Would like to repeat dry needling.    PT Treatment/Interventions  ADLs/Self Care Home Management;Electrical Stimulation;Therapeutic activities;Patient/family education;Therapeutic exercise;Moist Heat;Cryotherapy;Stair training;Neuromuscular re-education;Manual techniques;Dry needling;Balance training;Gait training;Taping;Functional mobility training    PT Next Visit Plan  repeat dry needling, continue LAD, manual to hip, QL and hip/glut stretches, strengthening as tolerated    PT Home Exercise Plan  bridge, PPT, hip abd, hamstring and piriformis, tandem head turns and calf stretch for Rt post knee    Consulted and Agree with Plan of Care  Patient       Patient will benefit from skilled therapeutic intervention in order to improve the following deficits  and impairments:  Increased fascial restricitons, Pain, Postural dysfunction, Decreased mobility, Decreased endurance, Decreased range of motion, Decreased strength, Impaired flexibility, Difficulty walking, Decreased balance  Visit Diagnosis: Pain in right hip  Other abnormalities of gait and mobility  Muscle weakness (generalized)  Abnormal posture  Sacroiliac joint pain     Problem List Patient Active Problem List   Diagnosis Date Noted  . Piriformis syndrome of right side 05/16/2019  . Lower extremity pain, right 04/23/2019  . DVT (deep venous thrombosis) ([place]) 04/23/2019  . Lumbar radiculopathy, right 04/02/2019  . Other long term (current) drug therapy 06/14/2017  . Anxiety and depression   . History of breast cancer 03/02/2017  . History of therapeutic radiation 03/02/2017  . Breast pain, right 10/03/2016  . Type 2 diabetes mellitus ([place]) 08/23/2016  . Hypertension associated with diabetes ([place]) 08/23/2016  . Hypothyroidism 08/23/2016  . Port catheter in place 05/09/2016  . Peripheral neuropathy 05/09/2016  . Breast cancer of upper-inner quadrant of right female breast ([place]) 03/09/2016  . Major depressive disorder, recurrent ([place]) 02/27/2014  . Uncomplicated alcohol dependence ([place]) 02/19/2014    Marissa Hall 06/10/2019, 11:21 AM  [facility] [address] [place], [place], [address] Phone: [phone]   Fax:  [phone]  Name: Marissa Hall MRN: [id number] Date of Birth: 1941-03-08  Raeford Razor, PT 06/10/19 11:21 AM Phone: [phone] Fax: [phone]

## 2019-06-12 ENCOUNTER — Other Ambulatory Visit: Payer: Self-pay

## 2019-06-12 ENCOUNTER — Encounter: Payer: Self-pay | Admitting: Physical Therapy

## 2019-06-12 ENCOUNTER — Ambulatory Visit: Payer: Medicare Other | Admitting: Physical Therapy

## 2019-06-12 DIAGNOSIS — M533 Sacrococcygeal disorders, not elsewhere classified: Secondary | ICD-10-CM

## 2019-06-12 DIAGNOSIS — R2689 Other abnormalities of gait and mobility: Secondary | ICD-10-CM

## 2019-06-12 DIAGNOSIS — M6281 Muscle weakness (generalized): Secondary | ICD-10-CM

## 2019-06-12 DIAGNOSIS — M25551 Pain in right hip: Secondary | ICD-10-CM | POA: Diagnosis not present

## 2019-06-12 DIAGNOSIS — R293 Abnormal posture: Secondary | ICD-10-CM | POA: Diagnosis not present

## 2019-06-12 NOTE — Therapy (Signed)
[place], [place], [address] Phone: [phone]   Fax:  [phone]  Physical Therapy Treatment  Patient Details  Name: Marissa Hall MRN: [id number] Date of Birth: [birth year] Referring Provider (PT): Dr. Hulan Saas    Encounter Date: 06/12/2019  PT End of Session - 06/12/19 1814    Visit Number  4    Number of Visits  16    Date for PT Re-Evaluation  07/30/19    Authorization Type  MCR, BCBS secondary, progress note by visit 10    PT Start Time  [id number]    PT Stop Time  [id number]   ended session early per pt. request, time spent dry needling not included in direct tx. minutes   PT Time Calculation (min)  38 min    Activity Tolerance  Patient tolerated treatment well    Behavior During Therapy  [facility] for tasks assessed/performed       Past Medical History:  Diagnosis Date  . Acute DVT (deep venous thrombosis) ([place]) 04/23/2019   Right popliteal  . Anxiety and depression 1964   oncologist started duloxetine 12/2016  . Breast cancer ([place]) 03/14/2016   Clinical stage 2A: (triple neg): Right breast, upper inner quadrant, 03/2016.  Neoadjuvant chemo x 5 cycles,lumpectomy 4 mo later, then RT started 10/2016.  Adjuvant Xeloda [id number].  SWOG research trial pt 04/2017--pt randomized to pembrolizumab immunotherapy.  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  [name] Kitchen Cataracts, bilateral 07/2017  . 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 Depression 1964   Patient states since age 47  . Diabetes mellitus with complication ([place]) [id number]   managed by endocrinology.  A1c Mar 12, 2018 was 7.0% at Dr. Shirlyn Goltz.   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  Excision done 02/2019  . GERD (gastroesophageal reflux disease) 2013  . Hyperlipidemia 1986  . Hypertension 2008  .  Hypothyroidism 1988   Diagnosed in her 72s.  Managed by Endocrinologist  . Lumbar radiculopathy 04/2019   Dr. Tamala Julian to get plain films of LB and hip (considering MRI due to her hx of cancer)  . Osteoporosis 2015   pt states "osteopenia", but then says that she refused to take the rx med for this condition, so I suspect she had osteoporosis.  . Peripheral neuropathy 2017   Patient states diabetic neuropathy in feet prior to starting chemotherapy and then worsened by chemo.   [name] Kitchen TIA (transient ischemic attack) 03/26/2011    Past Surgical History:  Procedure Laterality Date  . ABDOMINAL HYSTERECTOMY  1972  . APPENDECTOMY  1972  . BREAST ENHANCEMENT SURGERY  1982  . BREAST IMPLANT REMOVAL Right 09/13/2016   Procedure: REMOVAL RIGHT BREAST IMPLANT;  Surgeon: Irene Limbo, MD;  Location: [place];  Service: Plastics;  Laterality: Right;  . BREAST LUMPECTOMY WITH RADIOACTIVE SEED AND SENTINEL LYMPH NODE BIOPSY Right 09/13/2016   Procedure: RIGHT BREAST LUMPECTOMY WITH RADIOACTIVE SEED X 2 AND SENTINEL LYMPH NODE BIOPSY;  Surgeon: Alphonsa Overall, MD;  Location: [place];  Service: General;  Laterality: Right;  . BREAST SURGERY Right 03/14/2016   Biopsy  . CAPSULECTOMY Right 09/13/2016   Procedure: RIGHT CAPSULECTOMY;  Surgeon: Irene Limbo, MD;  Location: [place];  Service: Plastics;  Laterality: Right;  . CATARACT EXTRACTION, BILATERAL Bilateral  08/10/17 right eye, 08/31/17 left eye  . MASS EXCISION Left 02/28/2018   Path: benign.  Procedure: EXCISIONLEFT MEDIAL THIGH MASS ERAS PATHWAY;  Surgeon: Erroll Luna, MD;  Location: [place];  Service: General;  Laterality: Left;  . PORTACATH PLACEMENT N/A 03/15/2016   Procedure: INSERTION PORT-A-CATH WITH [place];  Surgeon: Alphonsa Overall, MD;  [facility];  Service: General;  Laterality: N/A;  . PORTACATH REMOVAL  07/2017  . surgical repair left ankle Left 2009   s/p Fall   .  TONSILLECTOMY AND ADENOIDECTOMY  1948   Age 79    There were no vitals filed for this visit.  Subjective Assessment - 06/12/19 1810    Subjective  Pt. reports right buttock pain exacerbated with prolonged sitting yesterday. She requests end tx. session early today due to having to make another appointment.    Pertinent History  Neuropathy, R knee pain.    Currently in Pain?  Yes    Pain Score  4     Pain Location  Buttocks    Pain Orientation  Right    Pain Descriptors / Indicators  Sore;Aching    Pain Type  Chronic pain    Pain Radiating Towards  right leg distal to knee    Pain Onset  More than a month ago    Pain Frequency  Constant    Aggravating Factors   sitting    Pain Relieving Factors  positional change    Effect of Pain on Daily Activities  limits positional tolerance and also causes sleep disturbance.                       [place] Adult PT Treatment/Exercise - 06/12/19 0001      Lumbar Exercises: Stretches   Other Lumbar Stretch Exercise  manual right QL stretch from left sidelying at edge of high low table 3x30 sec      Knee/Hip Exercises: Stretches   Passive Hamstring Stretch  Right;3 reps;30 seconds    Piriformis Stretch  Right;3 reps;30 seconds    Other Knee/Hip Stretches  R glut stretch 3x30 sec (manual stretch)      Manual Therapy   Joint Mobilization  Right hip LAD grade I-III    Soft tissue mobilization  STM and IASTM right glut, posterolateral hip inlcuding roller use       Trigger Point Dry Needling - 06/12/19 0001    Consent Given?  Yes    Muscles Treated Back/Hip  Gluteus medius;Gluteus maximus;Piriformis    Dry Needling Comments  needling performed in left sidelying with 32 gauge 60 mm needles    Gluteus Medius Response  Twitch response elicited    Gluteus Maximus Response  Twitch response elicited    Piriformis Response  Twitch response elicited           PT Education - 06/12/19 1813    Education Details  impact of neuropathy  on balance/proprioception    Person(s) Educated  Patient    Methods  Explanation    Comprehension  Verbalized understanding       PT Short Term Goals - 06/10/19 1117      PT SHORT TERM GOAL #1   Title  Pt will be I with HEP for hip and back flexibilty, strength    Status  On-going      PT SHORT TERM GOAL #2   Title  Patient will demonstrate full pain free right hip AROM    Status  On-going  PT SHORT TERM GOAL #3   Title  Pt will complete balance screen and set goal for improvement (DGI)    Status  Achieved      PT SHORT TERM GOAL #4   Title  Pt will report pain becoming more intermittent with normal daily activities    Status  On-going        PT Long Term Goals - 06/05/19 1111      PT LONG TERM GOAL #1   Title  Patient will sleep through the night without increased pain , about 50% of the time    Time  8    Period  Weeks    Status  New    Target Date  07/30/19      PT LONG TERM GOAL #2   Title  Pt will be able to walk through [facility] for 45 min with pain in back, hip no more than 3/10.    Time  8    Period  Weeks    Status  New    Target Date  07/30/19      PT LONG TERM GOAL #3   Title  Patieatient witll demonstrate a 42% limitation on FOTO    Baseline  54% limited    Time  8    Period  Weeks    Status  New    Target Date  07/30/19      PT LONG TERM GOAL #4   Title  Pt will become more aware of posture, lifting as she completes ADLs, work tasks (sitting less) and walks    Time  8    Period  Weeks    Status  New    Target Date  07/30/19            Plan - 06/12/19 1814    Clinical Impression Statement  Some setback with pain exacerbation as noted in subjective but good tx. response to decrease muscle tightness and pain in right glut region. Expect progress to address strength deficits will be more gradual and ongoing.    Personal Factors and Comorbidities  Age;Comorbidity 1;Comorbidity 2;Past/Current Experience;Time since onset of  injury/illness/exacerbation    Comorbidities  DVT 04/23/2019, Breast cancer, neuropathy, osteoporosis, depression    Examination-Activity Limitations  Squat;Stairs;Lift;Bed Mobility;Bend;Locomotion Level;Stand;Transfers;Sleep;Sit    Examination-Participation Restrictions  Shop;Cleaning;Yard Work;Meal Prep;Community Activity;Interpersonal Relationship    Stability/Clinical Decision Making  Evolving/Moderate complexity    Clinical Decision Making  Moderate    Rehab Potential  Good    PT Frequency  2x / week    PT Duration  8 weeks    PT Treatment/Interventions  ADLs/Self Care Home Management;Electrical Stimulation;Therapeutic activities;Patient/family education;Therapeutic exercise;Moist Heat;Cryotherapy;Stair training;Neuromuscular re-education;Manual techniques;Dry needling;Balance training;Gait training;Taping;Functional mobility training    PT Next Visit Plan  continue LAD, manual to hip, QL and hip/glut stretches, strengthening as tolerated    PT Home Exercise Plan  bridge, PPT, hip abd, hamstring and piriformis, tandem head turns and calf stretch for Rt post knee    Consulted and Agree with Plan of Care  Patient       Patient will benefit from skilled therapeutic intervention in order to improve the following deficits and impairments:  Increased fascial restricitons, Pain, Postural dysfunction, Decreased mobility, Decreased endurance, Decreased range of motion, Decreased strength, Impaired flexibility, Difficulty walking, Decreased balance  Visit Diagnosis: Pain in right hip  Other abnormalities of gait and mobility  Muscle weakness (generalized)  Abnormal posture  Sacroiliac joint pain     Problem List Patient Active  Problem List   Diagnosis Date Noted  . Piriformis syndrome of right side 05/16/2019  . Lower extremity pain, right 04/23/2019  . DVT (deep venous thrombosis) ([place]) 04/23/2019  . Lumbar radiculopathy, right 04/02/2019  . Other long term (current) drug therapy  06/14/2017  . Anxiety and depression   . History of breast cancer 03/02/2017  . History of therapeutic radiation 03/02/2017  . Breast pain, right 10/03/2016  . Type 2 diabetes mellitus ([place]) 08/23/2016  . Hypertension associated with diabetes ([place]) 08/23/2016  . Hypothyroidism 08/23/2016  . Port catheter in place 05/09/2016  . Peripheral neuropathy 05/09/2016  . Breast cancer of upper-inner quadrant of right female breast ([place]) 03/09/2016  . Major depressive disorder, recurrent ([place]) 02/27/2014  . Uncomplicated alcohol dependence ([place]) 02/19/2014    Beaulah Dinning, PT, DPT 06/12/19 6:17 PM  [facility] [address] [place], [place], [address] Phone: [phone]   Fax:  [phone]  Name: Marissa Hall MRN: [id number] Date of Birth: 27-Sep-1940

## 2019-06-19 ENCOUNTER — Ambulatory Visit: Payer: Medicare Other | Admitting: Physical Therapy

## 2019-06-19 ENCOUNTER — Other Ambulatory Visit: Payer: Self-pay

## 2019-06-19 ENCOUNTER — Encounter: Payer: Self-pay | Admitting: Physical Therapy

## 2019-06-19 VITALS — BP 143/74

## 2019-06-19 DIAGNOSIS — M533 Sacrococcygeal disorders, not elsewhere classified: Secondary | ICD-10-CM

## 2019-06-19 DIAGNOSIS — R293 Abnormal posture: Secondary | ICD-10-CM

## 2019-06-19 DIAGNOSIS — M6281 Muscle weakness (generalized): Secondary | ICD-10-CM | POA: Diagnosis not present

## 2019-06-19 DIAGNOSIS — M25551 Pain in right hip: Secondary | ICD-10-CM

## 2019-06-19 DIAGNOSIS — R2689 Other abnormalities of gait and mobility: Secondary | ICD-10-CM | POA: Diagnosis not present

## 2019-06-19 NOTE — Therapy (Signed)
[place], [place], [address] Phone: [phone]   Fax:  [phone]  Physical Therapy Treatment  Patient Details  Name: Marissa Hall MRN: [id number] Date of Birth: 1940/09/26 Referring Provider (PT): Dr. Hulan Saas    Encounter Date: 06/19/2019  PT End of Session - 06/19/19 1224    Visit Number  5    Number of Visits  16    Date for PT Re-Evaluation  07/30/19    Authorization Type  MCR, BCBS secondary, progress note by visit 10    PT Start Time  1216    PT Stop Time  1309    PT Time Calculation (min)  53 min    Activity Tolerance  Patient tolerated treatment well    Behavior During Therapy  [facility] for tasks assessed/performed       Past Medical History:  Diagnosis Date  . Acute DVT (deep venous thrombosis) ([place]) 04/23/2019   Right popliteal  . Anxiety and depression 1964   oncologist started duloxetine 12/2016  . Breast cancer ([place]) 03/14/2016   Clinical stage 2A: (triple neg): Right breast, upper inner quadrant, 03/2016.  Neoadjuvant chemo x 5 cycles,lumpectomy 4 mo later, then RT started 10/2016.  Adjuvant Xeloda [id number].  SWOG research trial pt 04/2017--pt randomized to pembrolizumab immunotherapy.  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  [name] Kitchen Cataracts, bilateral 07/2017  . 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 Depression 1964   Patient states since age 45  . Diabetes mellitus with complication ([place]) [id number]   managed by endocrinology.  A1c Mar 12, 2018 was 7.0% at Dr. Shirlyn Goltz.   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  Excision done 02/2019  . GERD (gastroesophageal reflux disease) 2013  . Hyperlipidemia 1986  . Hypertension 2008  . Hypothyroidism 1988   Diagnosed in her 81s.  Managed by Endocrinologist  . Lumbar radiculopathy  04/2019   Dr. Tamala Julian to get plain films of LB and hip (considering MRI due to her hx of cancer)  . Osteoporosis 2015   pt states "osteopenia", but then says that she refused to take the rx med for this condition, so I suspect she had osteoporosis.  . Peripheral neuropathy 2017   Patient states diabetic neuropathy in feet prior to starting chemotherapy and then worsened by chemo.   [name] Kitchen TIA (transient ischemic attack) 03/26/2011    Past Surgical History:  Procedure Laterality Date  . ABDOMINAL HYSTERECTOMY  1972  . APPENDECTOMY  1972  . BREAST ENHANCEMENT SURGERY  1982  . BREAST IMPLANT REMOVAL Right 09/13/2016   Procedure: REMOVAL RIGHT BREAST IMPLANT;  Surgeon: Irene Limbo, MD;  Location: [place];  Service: Plastics;  Laterality: Right;  . BREAST LUMPECTOMY WITH RADIOACTIVE SEED AND SENTINEL LYMPH NODE BIOPSY Right 09/13/2016   Procedure: RIGHT BREAST LUMPECTOMY WITH RADIOACTIVE SEED X 2 AND SENTINEL LYMPH NODE BIOPSY;  Surgeon: Alphonsa Overall, MD;  Location: [place];  Service: General;  Laterality: Right;  . BREAST SURGERY Right 03/14/2016   Biopsy  . CAPSULECTOMY Right 09/13/2016   Procedure: RIGHT CAPSULECTOMY;  Surgeon: Irene Limbo, MD;  Location: [place];  Service: Plastics;  Laterality: Right;  . CATARACT EXTRACTION, BILATERAL Bilateral 08/10/17 right eye, 08/31/17 left eye  . MASS EXCISION Left 02/28/2018   Path: benign.  Procedure: EXCISIONLEFT MEDIAL THIGH MASS ERAS PATHWAY;  Surgeon: Erroll Luna, MD;  Location: [place];  Service: General;  Laterality: Left;  . PORTACATH PLACEMENT N/A 03/15/2016   Procedure: INSERTION PORT-A-CATH WITH [place];  Surgeon: Alphonsa Overall, MD;  [facility];  Service: General;  Laterality: N/A;  . PORTACATH REMOVAL  07/2017  . surgical repair left ankle Left 2009   s/p Fall   . TONSILLECTOMY AND ADENOIDECTOMY  1948   Age 79    Vitals:   06/19/19 1220  BP: (!) 143/74     Subjective Assessment - 06/19/19 1220    Subjective  Benefitting from dry needling.  Got a lot of relief.  Sitting really makes it worse.    Currently in Pain?  Yes    Pain Score  2     Pain Location  Hip    Pain Orientation  Right    Pain Descriptors / Indicators  Aching    Pain Type  Chronic pain    Pain Onset  More than a month ago    Pain Frequency  Constant    Aggravating Factors   sitting    Pain Relieving Factors  PT         [facility] Adult PT Treatment/Exercise - 06/19/19 0001      Lumbar Exercises: Stretches   Active Hamstring Stretch  Right;3 reps    Active Hamstring Stretch Limitations  ant knee pain     Lower Trunk Rotation  3 reps;30 seconds    Lower Trunk Rotation Limitations  knees crossed       Knee/Hip Exercises: Stretches   Active Hamstring Stretch Limitations  better sitting than supine     Piriformis Stretch  Right;3 reps;30 seconds    Piriformis Stretch Limitations  seated and supine       Knee/Hip Exercises: Sidelying   Hip ABduction Limitations  knee bent x 10 green band     Clams  x 10  green band       Moist Heat Therapy   Number Minutes Moist Heat  10 Minutes    Moist Heat Location  Hip      Manual Therapy   Manual therapy comments  trigger points along SI border      Soft tissue mobilization  STM Right glut, posterolateral hip                PT Short Term Goals - 06/10/19 1117      PT SHORT TERM GOAL #1   Title  Pt will be I with HEP for hip and back flexibilty, strength    Status  On-going      PT SHORT TERM GOAL #2   Title  Patient will demonstrate full pain free right hip AROM    Status  On-going      PT SHORT TERM GOAL #3   Title  Pt will complete balance screen and set goal for improvement (DGI)    Status  Achieved      PT SHORT TERM GOAL #4   Title  Pt will report pain becoming more intermittent with normal daily activities    Status  On-going        PT Long Term Goals - 06/05/19 1111      PT LONG TERM GOAL  #1   Title  Patient will sleep through the night without increased pain , about 50% of the time    Time  8    Period  Weeks  Status  New    Target Date  07/30/19      PT LONG TERM GOAL #2   Title  Pt will be able to walk through [facility] for 45 min with pain in back, hip no more than 3/10.    Time  8    Period  Weeks    Status  New    Target Date  07/30/19      PT LONG TERM GOAL #3   Title  Patieatient witll demonstrate a 42% limitation on FOTO    Baseline  54% limited    Time  8    Period  Weeks    Status  New    Target Date  07/30/19      PT LONG TERM GOAL #4   Title  Pt will become more aware of posture, lifting as she completes ADLs, work tasks (sitting less) and walks    Time  8    Period  Weeks    Status  New    Target Date  07/30/19            Plan - 06/19/19 1224    Clinical Impression Statement  Pt complained of a pulsing sensation in Rt temple from time to time.  Not a headache.  She plans to call MD today.  BP typical and normal for patient. Worked on gentle mat strengthening exercises, limited by knee pain a bit. Tissues in Rt glute, piriformis are much less painful, tonic with manual work.  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    PT Treatment/Interventions  ADLs/Self Care Home Management;Electrical Stimulation;Therapeutic activities;Patient/family education;Therapeutic exercise;Moist Heat;Cryotherapy;Stair training;Neuromuscular re-education;Manual techniques;Dry needling;Balance training;Gait training;Taping;Functional mobility training    PT Next Visit Plan  continue LAD, manual to hip, QL and hip/glut stretches, strengthening as tolerated.  Needs core.    PT Home Exercise Plan  bridge, PPT, hip abd, hamstring and piriformis, tandem head turns and calf stretch for Rt post knee    Consulted and Agree with Plan of Care  Patient       Patient will benefit from skilled therapeutic intervention in order to improve the following deficits  and impairments:  Increased fascial restricitons, Pain, Postural dysfunction, Decreased mobility, Decreased endurance, Decreased range of motion, Decreased strength, Impaired flexibility, Difficulty walking, Decreased balance  Visit Diagnosis: Pain in right hip  Other abnormalities of gait and mobility  Muscle weakness (generalized)  Abnormal posture  Sacroiliac joint pain     Problem List Patient Active Problem List   Diagnosis Date Noted  . Piriformis syndrome of right side 05/16/2019  . Lower extremity pain, right 04/23/2019  . DVT (deep venous thrombosis) ([place]) 04/23/2019  . Lumbar radiculopathy, right 04/02/2019  . Other long term (current) drug therapy 06/14/2017  . Anxiety and depression   . History of breast cancer 03/02/2017  . History of therapeutic radiation 03/02/2017  . Breast pain, right 10/03/2016  . Type 2 diabetes mellitus ([place]) 08/23/2016  . Hypertension associated with diabetes ([place]) 08/23/2016  . Hypothyroidism 08/23/2016  . Port catheter in place 05/09/2016  . Peripheral neuropathy 05/09/2016  . Breast cancer of upper-inner quadrant of right female breast ([place]) 03/09/2016  . Major depressive disorder, recurrent ([place]) 02/27/2014  . Uncomplicated alcohol dependence ([place]) 02/19/2014    Marissa Hall 06/19/2019, 1:21 PM  [facility] [address] [place], [place], [address] Phone: [phone]   Fax:  [phone]  Name: Marissa Hall MRN: [id number] Date of Birth:  Sep 30, 1940  Raeford Razor, PT 06/19/19 1:21 PM Phone: [phone] Fax: [phone]

## 2019-06-21 ENCOUNTER — Encounter: Payer: Self-pay | Admitting: Physical Therapy

## 2019-06-21 ENCOUNTER — Ambulatory Visit: Payer: Medicare Other | Admitting: Physical Therapy

## 2019-06-21 ENCOUNTER — Other Ambulatory Visit: Payer: Self-pay

## 2019-06-21 DIAGNOSIS — M6281 Muscle weakness (generalized): Secondary | ICD-10-CM

## 2019-06-21 DIAGNOSIS — R2689 Other abnormalities of gait and mobility: Secondary | ICD-10-CM

## 2019-06-21 DIAGNOSIS — M533 Sacrococcygeal disorders, not elsewhere classified: Secondary | ICD-10-CM

## 2019-06-21 DIAGNOSIS — M25551 Pain in right hip: Secondary | ICD-10-CM | POA: Diagnosis not present

## 2019-06-21 DIAGNOSIS — R293 Abnormal posture: Secondary | ICD-10-CM

## 2019-06-21 NOTE — Therapy (Signed)
[place], [place], [address] Phone: [phone]   Fax:  [phone]  Physical Therapy Treatment  Patient Details  Name: Marissa Hall MRN: [id number] Date of Birth: 01-24-1941 Referring Provider (PT): Dr. Hulan Saas    Encounter Date: 06/21/2019  PT End of Session - 06/21/19 1152    Visit Number  6    Number of Visits  16    Date for PT Re-Evaluation  07/30/19    Authorization Type  MCR, BCBS secondary, progress note by visit 10    PT Start Time  1014    PT Stop Time  1059    PT Time Calculation (min)  45 min    Activity Tolerance  Patient tolerated treatment well    Behavior During Therapy  [facility] for tasks assessed/performed       Past Medical History:  Diagnosis Date  . Acute DVT (deep venous thrombosis) ([place]) 04/23/2019   Right popliteal  . Anxiety and depression 1964   oncologist started duloxetine 12/2016  . Breast cancer ([place]) 03/14/2016   Clinical stage 2A: (triple neg): Right breast, upper inner quadrant, 03/2016.  Neoadjuvant chemo x 5 cycles,lumpectomy 4 mo later, then RT started 10/2016.  Adjuvant Xeloda [id number].  SWOG research trial pt 04/2017--pt randomized to pembrolizumab immunotherapy.  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  [name] Kitchen Cataracts, bilateral 07/2017  . 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 Depression 1964   Patient states since age 53  . Diabetes mellitus with complication ([place]) [id number]   managed by endocrinology.  A1c Mar 12, 2018 was 7.0% at Dr. Shirlyn Goltz.   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  Excision done 02/2019  . GERD (gastroesophageal reflux disease) 2013  . Hyperlipidemia 1986  . Hypertension 2008  . Hypothyroidism 1988   Diagnosed in her 53s.  Managed by Endocrinologist  . Lumbar radiculopathy  04/2019   Dr. Tamala Julian to get plain films of LB and hip (considering MRI due to her hx of cancer)  . Osteoporosis 2015   pt states "osteopenia", but then says that she refused to take the rx med for this condition, so I suspect she had osteoporosis.  . Peripheral neuropathy 2017   Patient states diabetic neuropathy in feet prior to starting chemotherapy and then worsened by chemo.   [name] Kitchen TIA (transient ischemic attack) 03/26/2011    Past Surgical History:  Procedure Laterality Date  . ABDOMINAL HYSTERECTOMY  1972  . APPENDECTOMY  1972  . BREAST ENHANCEMENT SURGERY  1982  . BREAST IMPLANT REMOVAL Right 09/13/2016   Procedure: REMOVAL RIGHT BREAST IMPLANT;  Surgeon: Irene Limbo, MD;  Location: [place];  Service: Plastics;  Laterality: Right;  . BREAST LUMPECTOMY WITH RADIOACTIVE SEED AND SENTINEL LYMPH NODE BIOPSY Right 09/13/2016   Procedure: RIGHT BREAST LUMPECTOMY WITH RADIOACTIVE SEED X 2 AND SENTINEL LYMPH NODE BIOPSY;  Surgeon: Alphonsa Overall, MD;  Location: [place];  Service: General;  Laterality: Right;  . BREAST SURGERY Right 03/14/2016   Biopsy  . CAPSULECTOMY Right 09/13/2016   Procedure: RIGHT CAPSULECTOMY;  Surgeon: Irene Limbo, MD;  Location: [place];  Service: Plastics;  Laterality: Right;  . CATARACT EXTRACTION, BILATERAL Bilateral 08/10/17 right eye, 08/31/17 left eye  . MASS EXCISION Left 02/28/2018   Path: benign.  Procedure: EXCISIONLEFT MEDIAL THIGH MASS ERAS PATHWAY;  Surgeon: Erroll Luna, MD;  Location: [place];  Service: General;  Laterality: Left;  . PORTACATH PLACEMENT N/A 03/15/2016   Procedure: INSERTION PORT-A-CATH WITH [place];  Surgeon: Alphonsa Overall, MD;  [facility];  Service: General;  Laterality: N/A;  . PORTACATH REMOVAL  07/2017  . surgical repair left ankle Left 2009   s/p Fall   . TONSILLECTOMY AND ADENOIDECTOMY  1948   Age 24    There were no vitals filed for this  visit.  Subjective Assessment - 06/21/19 1044    Subjective  No pain pre-tx. and feels tx. helping but still having some limitations with prolonged sitting for right posterolateral hip pain. She reports issues with sleep disturbance but more due to anxiety than associated with hip/back. No issues reported today with previous "pulsing" sensation in temple region.    Currently in Pain?  No/denies         [facility] PT Assessment - 06/21/19 0001      Strength   Right/Left Hip  Right    Right Hip Extension  4/5    Right Hip ABduction  4+/5                   [facility] Adult PT Treatment/Exercise - 06/21/19 0001      Knee/Hip Exercises: Stretches   Passive Hamstring Stretch  Right;3 reps;30 seconds    ITB Stretch  Right;3 reps;30 seconds    ITB Stretch Limitations  knee flexed    Piriformis Stretch  Right;3 reps;30 seconds      Knee/Hip Exercises: Standing   Lateral Step Up  Left;1 set;10 reps;Hand Hold: 2;Step Height: 4"    Forward Step Up  Left;1 set;10 reps;Hand Hold: 2;Step Height: 4"    Functional Squat  15 reps    Functional Squat Limitations  partial squat at counter      Knee/Hip Exercises: Sidelying   Hip ABduction  AROM;Strengthening;Right;2 sets;10 reps    Clams  2x10 green band      Manual Therapy   Joint Mobilization  LAD right hip grade I-III    Soft tissue mobilization  STM and IASTM right glut and pitriformis region incl. roller use       Trigger Point Dry Needling - 06/21/19 0001    Consent Given?  Yes    Muscles Treated Back/Hip  Gluteus medius;Gluteus maximus;Piriformis    Dry Needling Comments  needling in left sidelying with pillow between knees with 32 gauge 60 and 50 mm needles, needling x 5 min total    Gluteus Medius Response  Twitch response elicited    Gluteus Maximus Response  Twitch response elicited    Piriformis Response  Twitch response elicited           PT Education - 06/21/19 1151    Education Details  POC, sleep hygiene     Person(s) Educated  Patient    Methods  Explanation    Comprehension  Verbalized understanding       PT Short Term Goals - 06/10/19 1117      PT SHORT TERM GOAL #1   Title  Pt will be I with HEP for hip and back flexibilty, strength    Status  On-going      PT SHORT TERM GOAL #2   Title  Patient will demonstrate full pain free right hip AROM    Status  On-going      PT SHORT TERM GOAL #3  Title  Pt will complete balance screen and set goal for improvement (DGI)    Status  Achieved      PT SHORT TERM GOAL #4   Title  Pt will report pain becoming more intermittent with normal daily activities    Status  On-going        PT Long Term Goals - 06/05/19 1111      PT LONG TERM GOAL #1   Title  Patient will sleep through the night without increased pain , about 50% of the time    Time  8    Period  Weeks    Status  New    Target Date  07/30/19      PT LONG TERM GOAL #2   Title  Pt will be able to walk through [facility] for 45 min with pain in back, hip no more than 3/10.    Time  8    Period  Weeks    Status  New    Target Date  07/30/19      PT LONG TERM GOAL #3   Title  Patieatient witll demonstrate a 42% limitation on FOTO    Baseline  54% limited    Time  8    Period  Weeks    Status  New    Target Date  07/30/19      PT LONG TERM GOAL #4   Title  Pt will become more aware of posture, lifting as she completes ADLs, work tasks (sitting less) and walks    Time  8    Period  Weeks    Status  New    Target Date  07/30/19            Plan - 06/21/19 1823    Clinical Impression Statement  Continued previous tx. with manual to right hip rotator region as well as brief dry needling and able to progress strengthening with closed chain activities for stepping and squat motions all with good tolerance. Will continue to monitor symptoms and progress further strengthening and functional activities as tolerated.    Personal Factors and Comorbidities  Age;Comorbidity  1;Comorbidity 2;Past/Current Experience;Time since onset of injury/illness/exacerbation    Comorbidities  DVT 04/23/2019, Breast cancer, neuropathy, osteoporosis, depression    Examination-Activity Limitations  Squat;Stairs;Lift;Bed Mobility;Bend;Locomotion Level;Stand;Transfers;Sleep;Sit    Examination-Participation Restrictions  Shop;Cleaning;Yard Work;Meal Prep;Community Activity;Interpersonal Relationship    Stability/Clinical Decision Making  Evolving/Moderate complexity    Clinical Decision Making  Moderate    Rehab Potential  Good    PT Frequency  2x / week    PT Duration  8 weeks    PT Treatment/Interventions  ADLs/Self Care Home Management;Electrical Stimulation;Therapeutic activities;Patient/family education;Therapeutic exercise;Moist Heat;Cryotherapy;Stair training;Neuromuscular re-education;Manual techniques;Dry needling;Balance training;Gait training;Taping;Functional mobility training    PT Next Visit Plan  continue LAD, manual to hip, QL and hip/glut stretches, strengthening as tolerated with mat-based and closed chain progression.  Continue work on core.    PT Home Exercise Plan  bridge, PPT, hip abd, hamstring and piriformis, tandem head turns and calf stretch for Rt post knee    Consulted and Agree with Plan of Care  Patient       Patient will benefit from skilled therapeutic intervention in order to improve the following deficits and impairments:  Increased fascial restricitons, Pain, Postural dysfunction, Decreased mobility, Decreased endurance, Decreased range of motion, Decreased strength, Impaired flexibility, Difficulty walking, Decreased balance  Visit Diagnosis: Pain in right hip  Other abnormalities of gait and mobility  Muscle  weakness (generalized)  Abnormal posture  Sacroiliac joint pain     Problem List Patient Active Problem List   Diagnosis Date Noted  . Piriformis syndrome of right side 05/16/2019  . Lower extremity pain, right 04/23/2019  . DVT  (deep venous thrombosis) ([place]) 04/23/2019  . Lumbar radiculopathy, right 04/02/2019  . Other long term (current) drug therapy 06/14/2017  . Anxiety and depression   . History of breast cancer 03/02/2017  . History of therapeutic radiation 03/02/2017  . Breast pain, right 10/03/2016  . Type 2 diabetes mellitus ([place]) 08/23/2016  . Hypertension associated with diabetes ([place]) 08/23/2016  . Hypothyroidism 08/23/2016  . Port catheter in place 05/09/2016  . Peripheral neuropathy 05/09/2016  . Breast cancer of upper-inner quadrant of right female breast ([place]) 03/09/2016  . Major depressive disorder, recurrent ([place]) 02/27/2014  . Uncomplicated alcohol dependence ([place]) 02/19/2014    Beaulah Dinning, PT, DPT 06/21/19 6:28 PM  [place] [facility] [address] [place], [place], [address] Phone: [phone]   Fax:  [phone]  Name: Diamantina Melillo MRN: [id number] Date of Birth: 09/22/1940

## 2019-06-24 ENCOUNTER — Other Ambulatory Visit: Payer: Self-pay

## 2019-06-24 ENCOUNTER — Ambulatory Visit: Payer: Medicare Other | Admitting: Physical Therapy

## 2019-06-24 ENCOUNTER — Encounter: Payer: Self-pay | Admitting: Physical Therapy

## 2019-06-24 DIAGNOSIS — M533 Sacrococcygeal disorders, not elsewhere classified: Secondary | ICD-10-CM

## 2019-06-24 DIAGNOSIS — M6281 Muscle weakness (generalized): Secondary | ICD-10-CM

## 2019-06-24 DIAGNOSIS — R293 Abnormal posture: Secondary | ICD-10-CM | POA: Diagnosis not present

## 2019-06-24 DIAGNOSIS — R2689 Other abnormalities of gait and mobility: Secondary | ICD-10-CM

## 2019-06-24 DIAGNOSIS — M25551 Pain in right hip: Secondary | ICD-10-CM | POA: Diagnosis not present

## 2019-06-24 NOTE — Therapy (Signed)
[place], [place], [address] Phone: [phone]   Fax:  [phone]  Physical Therapy Treatment  Patient Details  Name: Marissa Hall MRN: [id number] Date of Birth: 1941-05-04 Referring Provider (PT): Dr. Hulan Saas    Encounter Date: 06/24/2019  PT End of Session - 06/24/19 1619    Visit Number  7    Number of Visits  16    Date for PT Re-Evaluation  07/30/19    Authorization Type  MCR, BCBS secondary, progress note by visit 10    PT Start Time  [id number]   pt. arrived late   PT Stop Time  1628    PT Time Calculation (min)  34 min    Activity Tolerance  Patient tolerated treatment well    Behavior During Therapy  [facility] for tasks assessed/performed       Past Medical History:  Diagnosis Date  . Acute DVT (deep venous thrombosis) ([place]) 04/23/2019   Right popliteal  . Anxiety and depression 1964   oncologist started duloxetine 12/2016  . Breast cancer ([place]) 03/14/2016   Clinical stage 2A: (triple neg): Right breast, upper inner quadrant, 03/2016.  Neoadjuvant chemo x 5 cycles,lumpectomy 4 mo later, then RT started 10/2016.  Adjuvant Xeloda [id number].  SWOG research trial pt 04/2017--pt randomized to pembrolizumab immunotherapy.  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  [name] Kitchen Cataracts, bilateral 07/2017  . 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 Depression 1964   Patient states since age 79  . Diabetes mellitus with complication ([place]) [id number]   managed by endocrinology.  A1c Mar 12, 2018 was 7.0% at Dr. Shirlyn Goltz.   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  Excision done 02/2019  . GERD (gastroesophageal reflux disease) 2013  . Hyperlipidemia 1986  . Hypertension 2008  . Hypothyroidism 1988   Diagnosed in her 77s.  Managed by Endocrinologist  .  Lumbar radiculopathy 04/2019   Dr. Tamala Julian to get plain films of LB and hip (considering MRI due to her hx of cancer)  . Osteoporosis 2015   pt states "osteopenia", but then says that she refused to take the rx med for this condition, so I suspect she had osteoporosis.  . Peripheral neuropathy 2017   Patient states diabetic neuropathy in feet prior to starting chemotherapy and then worsened by chemo.   [name] Kitchen TIA (transient ischemic attack) 03/26/2011    Past Surgical History:  Procedure Laterality Date  . ABDOMINAL HYSTERECTOMY  1972  . APPENDECTOMY  1972  . BREAST ENHANCEMENT SURGERY  1982  . BREAST IMPLANT REMOVAL Right 09/13/2016   Procedure: REMOVAL RIGHT BREAST IMPLANT;  Surgeon: Irene Limbo, MD;  Location: [place];  Service: Plastics;  Laterality: Right;  . BREAST LUMPECTOMY WITH RADIOACTIVE SEED AND SENTINEL LYMPH NODE BIOPSY Right 09/13/2016   Procedure: RIGHT BREAST LUMPECTOMY WITH RADIOACTIVE SEED X 2 AND SENTINEL LYMPH NODE BIOPSY;  Surgeon: Alphonsa Overall, MD;  Location: [place];  Service: General;  Laterality: Right;  . BREAST SURGERY Right 03/14/2016   Biopsy  . CAPSULECTOMY Right 09/13/2016   Procedure: RIGHT CAPSULECTOMY;  Surgeon: Irene Limbo, MD;  Location: [place];  Service: Plastics;  Laterality: Right;  . CATARACT EXTRACTION, BILATERAL Bilateral 08/10/17 right eye, 08/31/17 left eye  . MASS EXCISION Left 02/28/2018  Path: benign.  Procedure: EXCISIONLEFT MEDIAL THIGH MASS ERAS PATHWAY;  Surgeon: Erroll Luna, MD;  Location: [place];  Service: General;  Laterality: Left;  . PORTACATH PLACEMENT N/A 03/15/2016   Procedure: INSERTION PORT-A-CATH WITH [place];  Surgeon: Alphonsa Overall, MD;  [facility];  Service: General;  Laterality: N/A;  . PORTACATH REMOVAL  07/2017  . surgical repair left ankle Left 2009   s/p Fall   . TONSILLECTOMY AND ADENOIDECTOMY  1948   Age 79    There were no vitals filed  for this visit.  Subjective Assessment - 06/24/19 1609    Subjective  Pt. was a few minutes late for appointment coming from work. Did OK after last session but had soreness starting this morning. She reports a lot of stress as work and defers dry needling today. She reports had "pulsing" sensation in temple region again yesterday-discussed inform MD when she speaks to him soon regarding medication.    Currently in Pain?  Yes    Pain Score  3    3-4   Pain Location  Hip    Pain Orientation  Right    Pain Descriptors / Indicators  Aching    Pain Type  Chronic pain    Pain Onset  More than a month ago    Pain Frequency  Constant    Aggravating Factors   sitting    Pain Relieving Factors  PT    Effect of Pain on Daily Activities  limits positional tolerance and also causes sleep disturbance                       [facility] Adult PT Treatment/Exercise - 06/24/19 0001      Knee/Hip Exercises: Stretches   Passive Hamstring Stretch  Right;3 reps;30 seconds    ITB Stretch  Right;3 reps;30 seconds    ITB Stretch Limitations  knee flexed    Piriformis Stretch  Right;3 reps;30 seconds    Other Knee/Hip Stretches  slant board stretch 20 sec x 3      Knee/Hip Exercises: Standing   Hip Abduction  AROM;Stengthening;Both;2 sets;10 reps    Abduction Limitations  2 lbs.    Lateral Step Up  Right;Left;1 set;15 reps;Step Height: 2";Step Height: 4"    Lateral Step Up Limitations  alternating side to side    Forward Step Up  Right;Left;2 sets;10 reps;Hand Hold: 2;Step Height: 4"    Functional Squat  15 reps    Functional Squat Limitations  partial squat at counter    Other Standing Knee Exercises  hip hike with RLE on 4 in. step x 10    Other Standing Knee Exercises  marches on [employer]x x 20 reps      Knee/Hip Exercises: Supine   Bridges with Diona Foley Squeeze  AROM;Strengthening;Both;15 reps    Other Supine Knee/Hip Exercises  alternating unilat. clamshell green band x20      Manual Therapy    Joint Mobilization  LAD right hip grade I-III    Soft tissue mobilization  STM and IASTM right glut and pitriformis region incl. roller use             PT Education - 06/24/19 1619    Education Details  POC, exercises, alert MD re: pulsing sensation as noted in subjective    Person(s) Educated  Patient    Methods  Explanation    Comprehension  Verbalized understanding       PT Short Term Goals - 06/10/19 1117  PT SHORT TERM GOAL #1   Title  Pt will be I with HEP for hip and back flexibilty, strength    Status  On-going      PT SHORT TERM GOAL #2   Title  Patient will demonstrate full pain free right hip AROM    Status  On-going      PT SHORT TERM GOAL #3   Title  Pt will complete balance screen and set goal for improvement (DGI)    Status  Achieved      PT SHORT TERM GOAL #4   Title  Pt will report pain becoming more intermittent with normal daily activities    Status  On-going        PT Long Term Goals - 06/05/19 1111      PT LONG TERM GOAL #1   Title  Patient will sleep through the night without increased pain , about 50% of the time    Time  8    Period  Weeks    Status  New    Target Date  07/30/19      PT LONG TERM GOAL #2   Title  Pt will be able to walk through [facility] for 45 min with pain in back, hip no more than 3/10.    Time  8    Period  Weeks    Status  New    Target Date  07/30/19      PT LONG TERM GOAL #3   Title  Patieatient witll demonstrate a 42% limitation on FOTO    Baseline  54% limited    Time  8    Period  Weeks    Status  New    Target Date  07/30/19      PT LONG TERM GOAL #4   Title  Pt will become more aware of posture, lifting as she completes ADLs, work tasks (sitting less) and walks    Time  8    Period  Weeks    Status  New    Target Date  07/30/19            Plan - 06/24/19 1621    Clinical Impression Statement  Mild setback with increased soreness today but continuing to respond well to tx. to decrease  glut region muscular pain as well as for closed chain/functional activitiy progression for strengthening. Unclear etiology pulsing sensation as noted in subjective-pt. reports plans on contacting MD re: medication concern so recommended alert to these symptoms as well.    Personal Factors and Comorbidities  Age;Comorbidity 1;Comorbidity 2;Past/Current Experience;Time since onset of injury/illness/exacerbation    Comorbidities  DVT 04/23/2019, Breast cancer, neuropathy, osteoporosis, depression    Examination-Activity Limitations  Squat;Stairs;Lift;Bed Mobility;Bend;Locomotion Level;Stand;Transfers;Sleep;Sit    Examination-Participation Restrictions  Shop;Cleaning;Yard Work;Meal Prep;Community Activity;Interpersonal Relationship    Stability/Clinical Decision Making  Evolving/Moderate complexity    Clinical Decision Making  Moderate    Rehab Potential  Good    PT Frequency  2x / week    PT Duration  8 weeks    PT Treatment/Interventions  ADLs/Self Care Home Management;Electrical Stimulation;Therapeutic activities;Patient/family education;Therapeutic exercise;Moist Heat;Cryotherapy;Stair training;Neuromuscular re-education;Manual techniques;Dry needling;Balance training;Gait training;Taping;Functional mobility training    PT Next Visit Plan  continue LAD, manual to hip, QL and hip/glut stretches, strengthening as tolerated with mat-based and closed chain progression.  Continue work on core. Further dry needling prn    PT Home Exercise Plan  bridge, PPT, hip abd, hamstring and piriformis, tandem head turns and calf stretch  for Rt post knee    Consulted and Agree with Plan of Care  Patient       Patient will benefit from skilled therapeutic intervention in order to improve the following deficits and impairments:  Increased fascial restricitons, Pain, Postural dysfunction, Decreased mobility, Decreased endurance, Decreased range of motion, Decreased strength, Impaired flexibility, Difficulty walking,  Decreased balance  Visit Diagnosis: Pain in right hip  Other abnormalities of gait and mobility  Muscle weakness (generalized)  Abnormal posture  Sacroiliac joint pain     Problem List Patient Active Problem List   Diagnosis Date Noted  . Piriformis syndrome of right side 05/16/2019  . Lower extremity pain, right 04/23/2019  . DVT (deep venous thrombosis) ([place]) 04/23/2019  . Lumbar radiculopathy, right 04/02/2019  . Other long term (current) drug therapy 06/14/2017  . Anxiety and depression   . History of breast cancer 03/02/2017  . History of therapeutic radiation 03/02/2017  . Breast pain, right 10/03/2016  . Type 2 diabetes mellitus ([place]) 08/23/2016  . Hypertension associated with diabetes ([place]) 08/23/2016  . Hypothyroidism 08/23/2016  . Port catheter in place 05/09/2016  . Peripheral neuropathy 05/09/2016  . Breast cancer of upper-inner quadrant of right female breast ([place]) 03/09/2016  . Major depressive disorder, recurrent ([place]) 02/27/2014  . Uncomplicated alcohol dependence ([place]) 02/19/2014    Beaulah Dinning, PT, DPT 06/24/19 4:29 PM  [facility] [address] [place], [place], [address] Phone: [phone]   Fax:  [phone]  Name: Marissa Hall MRN: [id number] Date of Birth: [birth year]

## 2019-06-26 ENCOUNTER — Ambulatory Visit: Payer: Medicare Other | Admitting: Physical Therapy

## 2019-06-27 ENCOUNTER — Other Ambulatory Visit: Payer: Self-pay

## 2019-06-27 ENCOUNTER — Encounter: Payer: Self-pay | Admitting: Family Medicine

## 2019-06-27 ENCOUNTER — Ambulatory Visit (INDEPENDENT_AMBULATORY_CARE_PROVIDER_SITE_OTHER): Payer: Medicare Other | Admitting: Family Medicine

## 2019-06-27 ENCOUNTER — Encounter: Payer: Self-pay | Admitting: Physical Therapy

## 2019-06-27 ENCOUNTER — Ambulatory Visit: Payer: Medicare Other | Admitting: Physical Therapy

## 2019-06-27 DIAGNOSIS — M792 Neuralgia and neuritis, unspecified: Secondary | ICD-10-CM

## 2019-06-27 DIAGNOSIS — G894 Chronic pain syndrome: Secondary | ICD-10-CM | POA: Diagnosis not present

## 2019-06-27 DIAGNOSIS — G4701 Insomnia due to medical condition: Secondary | ICD-10-CM

## 2019-06-27 DIAGNOSIS — M25551 Pain in right hip: Secondary | ICD-10-CM | POA: Diagnosis not present

## 2019-06-27 DIAGNOSIS — R2689 Other abnormalities of gait and mobility: Secondary | ICD-10-CM | POA: Diagnosis not present

## 2019-06-27 DIAGNOSIS — R293 Abnormal posture: Secondary | ICD-10-CM | POA: Diagnosis not present

## 2019-06-27 DIAGNOSIS — M533 Sacrococcygeal disorders, not elsewhere classified: Secondary | ICD-10-CM | POA: Diagnosis not present

## 2019-06-27 DIAGNOSIS — M6281 Muscle weakness (generalized): Secondary | ICD-10-CM

## 2019-06-27 NOTE — Therapy (Signed)
[place], [place], [address] Phone: [phone]   Fax:  [phone]  Physical Therapy Treatment  Patient Details  Name: Marissa Hall MRN: [id number] Date of Birth: 09/11/1940 Referring Provider (PT): Dr. Hulan Saas    Encounter Date: 06/27/2019  PT End of Session - 06/27/19 1542    Visit Number  8    Number of Visits  16    Date for PT Re-Evaluation  07/30/19    Authorization Type  MCR, BCBS secondary, progress note by visit 10    PT Start Time  1534    PT Stop Time  1615    PT Time Calculation (min)  41 min    Activity Tolerance  Patient tolerated treatment well    Behavior During Therapy  [facility] for tasks assessed/performed       Past Medical History:  Diagnosis Date  . Acute DVT (deep venous thrombosis) ([place]) 04/23/2019   Right popliteal  . Anxiety and depression 1964   oncologist started duloxetine 12/2016  . Breast cancer ([place]) 03/14/2016   Clinical stage 2A: (triple neg): Right breast, upper inner quadrant, 03/2016.  Neoadjuvant chemo x 5 cycles,lumpectomy 4 mo later, then RT started 10/2016.  Adjuvant Xeloda [id number].  SWOG research trial pt 04/2017--pt randomized to pembrolizumab immunotherapy.  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  [name] Kitchen Cataracts, bilateral 07/2017  . 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 Depression 1964   Patient states since age 66  . Diabetes mellitus with complication ([place]) [id number]   managed by endocrinology.  A1c Mar 12, 2018 was 7.0% at Dr. Shirlyn Goltz.   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  Excision done 02/2019  . GERD (gastroesophageal reflux disease) 2013  . Hyperlipidemia 1986  . Hypertension 2008  . Hypothyroidism 1988   Diagnosed in her 33s.  Managed by Endocrinologist  . Lumbar radiculopathy  04/2019   Dr. Tamala Julian to get plain films of LB and hip (considering MRI due to her hx of cancer)  . Osteoporosis 2015   pt states "osteopenia", but then says that she refused to take the rx med for this condition, so I suspect she had osteoporosis.  . Peripheral neuropathy 2017   Patient states diabetic neuropathy in feet prior to starting chemotherapy and then worsened by chemo.   [name] Kitchen TIA (transient ischemic attack) 03/26/2011    Past Surgical History:  Procedure Laterality Date  . ABDOMINAL HYSTERECTOMY  1972  . APPENDECTOMY  1972  . BREAST ENHANCEMENT SURGERY  1982  . BREAST IMPLANT REMOVAL Right 09/13/2016   Procedure: REMOVAL RIGHT BREAST IMPLANT;  Surgeon: Irene Limbo, MD;  Location: [place];  Service: Plastics;  Laterality: Right;  . BREAST LUMPECTOMY WITH RADIOACTIVE SEED AND SENTINEL LYMPH NODE BIOPSY Right 09/13/2016   Procedure: RIGHT BREAST LUMPECTOMY WITH RADIOACTIVE SEED X 2 AND SENTINEL LYMPH NODE BIOPSY;  Surgeon: Alphonsa Overall, MD;  Location: [place];  Service: General;  Laterality: Right;  . BREAST SURGERY Right 03/14/2016   Biopsy  . CAPSULECTOMY Right 09/13/2016   Procedure: RIGHT CAPSULECTOMY;  Surgeon: Irene Limbo, MD;  Location: [place];  Service: Plastics;  Laterality: Right;  . CATARACT EXTRACTION, BILATERAL Bilateral 08/10/17 right eye, 08/31/17 left eye  . MASS EXCISION Left 02/28/2018   Path: benign.  Procedure: EXCISIONLEFT MEDIAL THIGH MASS ERAS PATHWAY;  Surgeon: Erroll Luna, MD;  Location: [place];  Service: General;  Laterality: Left;  . PORTACATH PLACEMENT N/A 03/15/2016   Procedure: INSERTION PORT-A-CATH WITH [place];  Surgeon: Alphonsa Overall, MD;  [facility];  Service: General;  Laterality: N/A;  . PORTACATH REMOVAL  07/2017  . surgical repair left ankle Left 2009   s/p Fall   . TONSILLECTOMY AND ADENOIDECTOMY  1948   Age 80    There were no vitals filed for this  visit.  Subjective Assessment - 06/27/19 1541    Subjective  HIp is better than it has been.  Discussed some sleep issues and sleep/meditation.    Currently in Pain?  No/denies        [facility] Adult PT Treatment/Exercise - 06/27/19 0001      Pilates   Pilates Reformer  See note       Knee/Hip Exercises: Stretches   Piriformis Stretch  Right;3 reps;30 seconds      NuStep 6 min L5  Pilates Reformer used for LE/core strength, postural strength, lumbopelvic disassociation and core control.  Exercises included: Footwork 2 Red 1 blue parallel turnout heels and toes, wide and narrow  Heel raises (incr knee pain )  Single leg 2 Red cues for full knee extension  Bridging all springs with ball x 10 cues to articulate low lumbar   Single leg 1 Red hamstring and ITB stretching with PT guidance , opposite hip flexor as well X 3 each side        PT Education - 06/27/19 1555    Education Details  Pilates exercises    Person(s) Educated  Patient    Methods  Explanation;Demonstration;Verbal cues    Comprehension  Verbalized understanding       PT Short Term Goals - 06/10/19 1117      PT SHORT TERM GOAL #1   Title  Pt will be I with HEP for hip and back flexibilty, strength    Status  On-going      PT SHORT TERM GOAL #2   Title  Patient will demonstrate full pain free right hip AROM    Status  On-going      PT SHORT TERM GOAL #3   Title  Pt will complete balance screen and set goal for improvement (DGI)    Status  Achieved      PT SHORT TERM GOAL #4   Title  Pt will report pain becoming more intermittent with normal daily activities    Status  On-going        PT Long Term Goals - 06/05/19 1111      PT LONG TERM GOAL #1   Title  Patient will sleep through the night without increased pain , about 50% of the time    Time  8    Period  Weeks    Status  New    Target Date  07/30/19      PT LONG TERM GOAL #2   Title  Pt will be able to walk through [facility] for 45 min with  pain in back, hip no more than 3/10.    Time  8    Period  Weeks    Status  New    Target Date  07/30/19      PT LONG TERM GOAL #3   Title  Patieatient witll demonstrate a 42% limitation on FOTO    Baseline  54% limited  Time  8    Period  Weeks    Status  New    Target Date  07/30/19      PT LONG TERM GOAL #4   Title  Pt will become more aware of posture, lifting as she completes ADLs, work tasks (sitting less) and walks    Time  8    Period  Weeks    Status  New    Target Date  07/30/19            Plan - 06/27/19 1748    Clinical Impression Statement  No hip pain reported today, just Rt knee pain that is exacerbated by hamstring stretching.  Offered Pilates Reformer exercises to introduce low impact LE and core strengthening.  No pain increase. She was able to note sense of spine lengthening.  Cont POC.    PT Treatment/Interventions  ADLs/Self Care Home Management;Electrical Stimulation;Therapeutic activities;Patient/family education;Therapeutic exercise;Moist Heat;Cryotherapy;Stair training;Neuromuscular re-education;Manual techniques;Dry needling;Balance training;Gait training;Taping;Functional mobility training    PT Next Visit Plan  continue LAD, manual to hip, QL and hip/glut stretches, strengthening as tolerated with mat-based and closed chain progression.  Continue work on core. Further dry needling prn    PT Home Exercise Plan  bridge, PPT, hip abd, hamstring and piriformis, tandem head turns and calf stretch for Rt post knee    Consulted and Agree with Plan of Care  Patient       Patient will benefit from skilled therapeutic intervention in order to improve the following deficits and impairments:  Increased fascial restricitons, Pain, Postural dysfunction, Decreased mobility, Decreased endurance, Decreased range of motion, Decreased strength, Impaired flexibility, Difficulty walking, Decreased balance  Visit Diagnosis: Pain in right hip  Other abnormalities of  gait and mobility  Muscle weakness (generalized)  Abnormal posture  Sacroiliac joint pain     Problem List Patient Active Problem List   Diagnosis Date Noted  . Piriformis syndrome of right side 05/16/2019  . Lower extremity pain, right 04/23/2019  . DVT (deep venous thrombosis) ([place]) 04/23/2019  . Lumbar radiculopathy, right 04/02/2019  . Other long term (current) drug therapy 06/14/2017  . Anxiety and depression   . History of breast cancer 03/02/2017  . History of therapeutic radiation 03/02/2017  . Breast pain, right 10/03/2016  . Type 2 diabetes mellitus ([place]) 08/23/2016  . Hypertension associated with diabetes ([place]) 08/23/2016  . Hypothyroidism 08/23/2016  . Port catheter in place 05/09/2016  . Peripheral neuropathy 05/09/2016  . Breast cancer of upper-inner quadrant of right female breast ([place]) 03/09/2016  . Major depressive disorder, recurrent ([place]) 02/27/2014  . Uncomplicated alcohol dependence ([place]) 02/19/2014    Marissa Hall 06/27/2019, 5:50 PM  [facility] [address] [place], [place], [address] Phone: [phone]   Fax:  [phone]  Name: Marissa Hall MRN: [id number] Date of Birth: [birth year]  Raeford Razor, PT 06/27/19 5:52 PM Phone: [phone] Fax: [phone]

## 2019-06-27 NOTE — Progress Notes (Signed)
Virtual Visit via Video Note  I connected with pt on 06/27/19 at  1:00 PM EST by telephone (a video enabled telemedicine application was not available to the patient) and verified that I am speaking with the correct person using two identifiers.  Location patient: home Location provider:work or home office Persons participating in the virtual visit: patient, provider  I discussed the limitations of evaluation and management by telemedicine and the availability of in person appointments. The patient expressed understanding and agreed to proceed.  Telemedicine visit is a necessity given the COVID-19 restrictions in place at the current time.  HPI: 79 y/o WF with whom I am doing a telephone visit today (due to COVID-19 pandemic restrictions). This is a 2 mo f/u HTN, chemo induced PN, recent R gluteal pain (tendonopathy?),chronic depression, and recent dx of R popliteal DVT. A/P as of last visit: "1) Acute R leg DVT: saw Dr. Lindi Adie yesterday, no sign of breast ca recurrence. Plans xarelto x 6 mo.  Pt has f/u with him next summer.  2) Chronic peripheral neuropathy pain + right glut/thigh pain->continue gabapentin at 600 mg qhs which is all she can tolerate.  Continue with plan for f/u with Dr. Tamala Julian.  3) HTN: The current medical regimen is effective;  continue present plan and medications. Recent cr/lytes good.  4) Anx/dep: she is at baseline, pushing through. She reconsider start of the fluoxetine she has.  Continue counseling at [facility]."  Interim hx: Feeling better. Has been doing outpt rehab the last few weeks for her right hip pain, was seen by Dr. Tamala Julian 05/16/19 for working dx of piriformis syndrome and got depo medrol and toradol, and she declined an injection into the muscle itself. He feels that some of her pain is secondary to lumbar radiculopathy.  No med changes were done at that time. She has not done well on gabapentin. She changed over to trazodone that I had rx'd  for her in the remote past.  Taking 50 mg qhs. This seems to help her sleep and neuropathic pain.  Lasts only until middle of night.  Dry needling and PT helping with her back AND her neuropathic pain is a little better, too. Pt feeling lots of benefit from this.  Doing fine on the xarelto, no sign of bleeding.    DM2/HLD/Hypoth: stable lately per pt report, follwed by endo.    Past Medical History:  Diagnosis Date  . Acute DVT (deep venous thrombosis) ([place]) 04/23/2019   Right popliteal  . Anxiety and depression 1964   oncologist started duloxetine 12/2016  . Breast cancer ([place]) 03/14/2016   Clinical stage 2A: (triple neg): Right breast, upper inner quadrant, 03/2016.  Neoadjuvant chemo x 5 cycles,lumpectomy 4 mo later, then RT started 10/2016.  Adjuvant Xeloda [id number].  SWOG research trial pt 04/2017--pt randomized to pembrolizumab immunotherapy.  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  [name] Kitchen Cataracts, bilateral 07/2017  . 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 Depression 1964   Patient states since age 27  . Diabetes mellitus with complication ([place]) [id number]   managed by endocrinology.  A1c Mar 12, 2018 was 7.0% at Dr. Shirlyn Goltz.   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  Excision done 02/2019  . GERD (gastroesophageal reflux disease) 2013  . Hyperlipidemia 1986  . Hypertension 2008  . Hypothyroidism  1988   Diagnosed in her 25s.  Managed by Endocrinologist  . Lumbar radiculopathy 04/2019   Dr. Tamala Julian to get plain films of LB and hip (considering MRI due to her hx of cancer)  . Osteoporosis 2015   pt states "osteopenia", but then says that she refused to take the rx med for this condition, so I suspect she had osteoporosis.  . Peripheral neuropathy 2017   Patient states diabetic neuropathy in feet prior to starting  chemotherapy and then worsened by chemo.   [name] Kitchen TIA (transient ischemic attack) 03/26/2011    Past Surgical History:  Procedure Laterality Date  . ABDOMINAL HYSTERECTOMY  1972  . APPENDECTOMY  1972  . BREAST ENHANCEMENT SURGERY  1982  . BREAST IMPLANT REMOVAL Right 09/13/2016   Procedure: REMOVAL RIGHT BREAST IMPLANT;  Surgeon: Irene Limbo, MD;  Location: [place];  Service: Plastics;  Laterality: Right;  . BREAST LUMPECTOMY WITH RADIOACTIVE SEED AND SENTINEL LYMPH NODE BIOPSY Right 09/13/2016   Procedure: RIGHT BREAST LUMPECTOMY WITH RADIOACTIVE SEED X 2 AND SENTINEL LYMPH NODE BIOPSY;  Surgeon: Alphonsa Overall, MD;  Location: [place];  Service: General;  Laterality: Right;  . BREAST SURGERY Right 03/14/2016   Biopsy  . CAPSULECTOMY Right 09/13/2016   Procedure: RIGHT CAPSULECTOMY;  Surgeon: Irene Limbo, MD;  Location: [place];  Service: Plastics;  Laterality: Right;  . CATARACT EXTRACTION, BILATERAL Bilateral 08/10/17 right eye, 08/31/17 left eye  . MASS EXCISION Left 02/28/2018   Path: benign.  Procedure: EXCISIONLEFT MEDIAL THIGH MASS ERAS PATHWAY;  Surgeon: Erroll Luna, MD;  Location: [facility];  Service: General;  Laterality: Left;  . PORTACATH PLACEMENT N/A 03/15/2016   Procedure: INSERTION PORT-A-CATH WITH [place];  Surgeon: Alphonsa Overall, MD;  [facility];  Service: General;  Laterality: N/A;  . PORTACATH REMOVAL  07/2017  . surgical repair left ankle Left 2009   s/p Fall   . TONSILLECTOMY AND ADENOIDECTOMY  1948   Age 62    Family History  Problem Relation Age of Onset  . Stroke Mother   . Suicidality Father   . Stroke Brother   . Stroke Son     SOCIAL HX:  Social History   Socioeconomic History  . Marital status: Divorced    Spouse name: Not on file  . Number of children: Not on file  . Years of education: Not on file  . Highest education level: Not on file  Occupational History  . Not on  file  Tobacco Use  . Smoking status: Former Smoker    Packs/day: 1.00    Types: Cigarettes    Quit date: 03/08/1999    Years since quitting: 20.3  . Smokeless tobacco: Never Used  Substance and Sexual Activity  . Alcohol use: Yes    Alcohol/week: 7.0 standard drinks    Types: 7 Shots of liquor per week    Comment:  daily  . Drug use: No  . Sexual activity: Yes    Partners: Male    Comment: 1st intercourse- 28, partners- 10+, current partner- 8 yrs  Other Topics Concern  . Not on file  Social History Narrative   Divorced, has a son who lives in [place].   Educ: college   Occup: Optometrist, still working part time.   Tob: quit 2000.     Alc: several times a week--1-2 glasses wine.   No drugs.   Social Determinants of Health   Financial Resource Strain:   .  Difficulty of Paying Living Expenses: Not on file  Food Insecurity:   . Worried About Charity fundraiser in the Last Year: Not on file  . Ran Out of Food in the Last Year: Not on file  Transportation Needs:   . Lack of Transportation (Medical): Not on file  . Lack of Transportation (Non-Medical): Not on file  Physical Activity:   . Days of Exercise per Week: Not on file  . Minutes of Exercise per Session: Not on file  Stress:   . Feeling of Stress : Not on file  Social Connections:   . Frequency of Communication with Friends and Family: Not on file  . Frequency of Social Gatherings with Friends and Family: Not on file  . Attends Religious Services: Not on file  . Active Member of Clubs or Organizations: Not on file  . Attends Archivist Meetings: Not on file  . Marital Status: Not on file      Current Outpatient Medications:  .  atorvastatin (LIPITOR) 80 MG tablet, Take 80 mg by mouth every evening. , Disp: , Rfl:  .  Cholecalciferol (VITAMIN D3) 125 MCG (5000 UT) TABS, Take by mouth., Disp: , Rfl:  .  gabapentin (NEURONTIN) 400 MG capsule, Take 1 capsule (400 mg total) by mouth at bedtime., Disp: 30  capsule, Rfl: 0 .  levothyroxine (SYNTHROID) 112 MCG tablet, Take by mouth., Disp: , Rfl:  .  lisinopril (ZESTRIL) 30 MG tablet, Take 1 tablet (30 mg total) by mouth daily., Disp: 90 tablet, Rfl: 1 .  Menthol, Topical Analgesic, (BIOFREEZE EX), Apply topically as needed., Disp: , Rfl:  .  metFORMIN (GLUCOPHAGE-XR) 500 MG 24 hr tablet, Take 1,000 mg by mouth daily before supper. , Disp: , Rfl:  .  pyridoxine (B-6) 100 MG tablet, Take 100 mg by mouth daily. , Disp: , Rfl:  .  rivaroxaban (XARELTO) 20 MG TABS tablet, Take 1 tablet (20 mg total) by mouth daily with supper., Disp: 30 tablet, Rfl: 1  EXAM:  VITALS per patient if applicable: There were no vitals taken for this visit.   GENERAL: alert, oriented, sounds well and in no acute distress  No further exam b/c audio visit only.  LABS: none today    Chemistry      Component Value Date/Time   NA 139 04/24/2019 1129   NA 136 05/01/2017 0914   K 4.8 04/24/2019 1129   K 4.5 05/01/2017 0914   CL 101 04/24/2019 1129   CO2 25 04/24/2019 1129   CO2 24 05/01/2017 0914   BUN 20 04/24/2019 1129   BUN 14.9 05/01/2017 0914   CREATININE 0.90 04/24/2019 1129   CREATININE 0.8 05/01/2017 0914      Component Value Date/Time   CALCIUM 9.7 04/24/2019 1129   CALCIUM 9.6 05/01/2017 0914   ALKPHOS 62 04/24/2019 1129   ALKPHOS 75 05/01/2017 0914   AST 35 04/24/2019 1129   AST 51 (H) 05/01/2017 0914   ALT 40 04/24/2019 1129   ALT 62 (H) 05/01/2017 0914   BILITOT 1.2 04/24/2019 1129   BILITOT 1.30 (H) 05/01/2017 0914     Lab Results  Component Value Date   WBC 4.6 04/24/2019   HGB 14.0 04/24/2019   HCT 42.3 04/24/2019   MCV 98.1 04/24/2019   PLT 148 (L) 04/24/2019   Lab Results  Component Value Date   HGBA1C 7.0 03/12/2018    ASSESSMENT AND PLAN:  Discussed the following assessment and plan:  1) Chronic pain  syndrome: chemo-induced neuropathy/neuropathic pain, + superimposed R piriformis syndrome vs lumbar  radiculopathy-->improving signif with PT.   2) Insomnia: mainly secondary to her pain: trazodone 50mg  qhs helping better than gabapentin but not lasting long enough.  She can try to Increase to 2-3 of the 50mg  trazodone tabs qhs and we'll see if duration is extended.  I don't recommend we try any of this med in daytime b/c I suspect most of the benefit she is getting from it is drowsiness/sleep aid and not really tx of her neuropathy.  However, if she is not too sedated by it over time, it may be worth considering switch over to tricyclic and see if daily multi-dosing helps her neuropathy.  3) Acute DVT: she has about 4 more months of xarelto to go. No sign of any probs with this med.  -we discussed possible serious and likely etiologies, options for evaluation and workup, limitations of telemedicine visit vs in person visit, treatment, treatment risks and precautions. Pt prefers to treat via telemedicine empirically rather then risking or undertaking an in person visit at this moment. Patient agrees to seek prompt in person care if worsening, new symptoms arise, or if is not improving with treatment.   I discussed the assessment and treatment plan with the patient. The patient was provided an opportunity to ask questions and all were answered. The patient agreed with the plan and demonstrated an understanding of the instructions.   The patient was advised to call back or seek an in-person evaluation if the symptoms worsen or if the condition fails to improve as anticipated.  F/u: 6 mo  Signed:  Crissie Sickles, MD           06/27/2019

## 2019-06-28 ENCOUNTER — Telehealth: Payer: Self-pay | Admitting: *Deleted

## 2019-06-28 NOTE — Telephone Encounter (Signed)
Received call from pt stating she went to the pharmacy to pick up her Xarelto and was told it would be over $300.  Pt states she has a deductible to meet every year and has not met it as of yet.  Pt upset, stating she only has one day left of medication and will not be able to pay for a refill until next week.  RN placed call to pharmacy to see if pt would be able to use the Xarelto savings card.  Per pharmacist, pt has medicare part D which excludes her from being able to utilize the savings card.  Pharmacist states they can refill script for a 7 day supply at $121 and there were no other programs to help the pt.  RN placed call to pt to relay pharmacy information. Pt very upset and states she will figure something out so she can pick up her prescription.  RN apologized for the inconvenience and educated the pt to call the office if she has any more concerns. Pt verbalized understanding and appreciative of the help.

## 2019-06-29 MED FILL — XARELTO 20 MG TABLET: 20 | 7 days supply | Qty: 7 | Fill #1

## 2019-07-05 MED FILL — XARELTO 20 MG TABLET: 20 | 23 days supply | Qty: 23 | Fill #2

## 2019-07-08 ENCOUNTER — Ambulatory Visit: Payer: Medicare Other | Attending: Family Medicine | Admitting: Physical Therapy

## 2019-07-08 ENCOUNTER — Other Ambulatory Visit: Payer: Self-pay

## 2019-07-08 ENCOUNTER — Encounter: Payer: Self-pay | Admitting: Physical Therapy

## 2019-07-08 DIAGNOSIS — M25551 Pain in right hip: Secondary | ICD-10-CM

## 2019-07-08 DIAGNOSIS — R293 Abnormal posture: Secondary | ICD-10-CM | POA: Diagnosis not present

## 2019-07-08 DIAGNOSIS — R2689 Other abnormalities of gait and mobility: Secondary | ICD-10-CM | POA: Diagnosis not present

## 2019-07-08 DIAGNOSIS — M6281 Muscle weakness (generalized): Secondary | ICD-10-CM | POA: Insufficient documentation

## 2019-07-08 DIAGNOSIS — M533 Sacrococcygeal disorders, not elsewhere classified: Secondary | ICD-10-CM | POA: Diagnosis not present

## 2019-07-08 NOTE — Therapy (Signed)
[place], [place], [address] Phone: [phone]   Fax:  [phone]  Physical Therapy Treatment Progress Note Reporting Period 06/04/2019 to 07/08/2019  See note below for Objective Data and Assessment of Progress/Goals.       Patient Details  Name: Marissa Hall MRN: [id number] Date of Birth: 1940/06/19 Referring Provider (PT): Dr. Hulan Saas    Encounter Date: 07/08/2019  PT End of Session - 07/08/19 1059    Visit Number  9    Number of Visits  16    Date for PT Re-Evaluation  07/30/19    Authorization Type  MCR, BCBS secondary, next progress note by visit 19    PT Start Time  1016    PT Stop Time  1100    PT Time Calculation (min)  44 min    Activity Tolerance  Patient tolerated treatment well    Behavior During Therapy  WFL for tasks assessed/performed       Past Medical History:  Diagnosis Date  . Acute DVT (deep venous thrombosis) ([place]) 04/23/2019   Right popliteal  . Anxiety and depression 1964   oncologist started duloxetine 12/2016  . Breast cancer ([place]) 03/14/2016   Clinical stage 2A: (triple neg): Right breast, upper inner quadrant, 03/2016.  Neoadjuvant chemo x 5 cycles,lumpectomy 4 mo later, then RT started 10/2016.  Adjuvant Xeloda [id number].  SWOG research trial pt 04/2017--pt randomized to pembrolizumab immunotherapy.  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  [name] Kitchen Cataracts, bilateral 07/2017  . 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 Depression 1964   Patient states since age 87  . Diabetes mellitus with complication ([place]) 7741   managed by endocrinology.  A1c Mar 12, 2018 was 7.0% at Dr. Shirlyn Goltz.   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  Excision done 02/2019  . GERD (gastroesophageal reflux disease) 2013  .  Hyperlipidemia 1986  . Hypertension 2008  . Hypothyroidism 1988   Diagnosed in her 15s.  Managed by Endocrinologist  . Lumbar radiculopathy 04/2019   Dr. Tamala Julian to get plain films of LB and hip (considering MRI due to her hx of cancer)  . Osteoporosis 2015   pt states "osteopenia", but then says that she refused to take the rx med for this condition, so I suspect she had osteoporosis.  . Peripheral neuropathy 2017   Patient states diabetic neuropathy in feet prior to starting chemotherapy and then worsened by chemo.   [name] Kitchen TIA (transient ischemic attack) 03/26/2011    Past Surgical History:  Procedure Laterality Date  . ABDOMINAL HYSTERECTOMY  1972  . APPENDECTOMY  1972  . BREAST ENHANCEMENT SURGERY  1982  . BREAST IMPLANT REMOVAL Right 09/13/2016   Procedure: REMOVAL RIGHT BREAST IMPLANT;  Surgeon: Irene Limbo, MD;  Location: [facility];  Service: Plastics;  Laterality: Right;  . BREAST LUMPECTOMY WITH RADIOACTIVE SEED AND SENTINEL LYMPH NODE BIOPSY Right 09/13/2016   Procedure: RIGHT BREAST LUMPECTOMY WITH RADIOACTIVE SEED X 2 AND SENTINEL LYMPH NODE BIOPSY;  Surgeon: Alphonsa Overall, MD;  Location: [facility];  Service: General;  Laterality: Right;  . BREAST SURGERY Right 03/14/2016   Biopsy  . CAPSULECTOMY Right 09/13/2016   Procedure: RIGHT CAPSULECTOMY;  Surgeon: Irene Limbo, MD;  Location: [facility];  Service: Plastics;  Laterality:  Right;  [name] Kitchen CATARACT EXTRACTION, BILATERAL Bilateral 08/10/17 right eye, 08/31/17 left eye  . MASS EXCISION Left 02/28/2018   Path: benign.  Procedure: EXCISIONLEFT MEDIAL THIGH MASS ERAS PATHWAY;  Surgeon: Erroll Luna, MD;  Location: [facility];  Service: General;  Laterality: Left;  . PORTACATH PLACEMENT N/A 03/15/2016   Procedure: INSERTION PORT-A-CATH WITH [place];  Surgeon: Alphonsa Overall, MD;  [facility];  Service: General;  Laterality: N/A;  . PORTACATH REMOVAL  07/2017  . surgical  repair left ankle Left 2009   s/p Fall   . TONSILLECTOMY AND ADENOIDECTOMY  1948   Age 17    There were no vitals filed for this visit.  Subjective Assessment - 07/08/19 1037    Subjective  Pt. rates improvement since starting therpay at around 65-70% better from baseline. Still with functional limitations in particular for stair navigation especially with descending stairs and also with difficulty walking down declined. See plan.    Pertinent History  Neuropathy, R knee pain.    Diagnostic tests  XR mild degenerative changes 10/20    Patient Stated Goals  Patient would like to get out of this pain.    Currently in Pain?  Yes    Pain Score  2     Pain Location  Hip    Pain Orientation  Right    Pain Descriptors / Indicators  Aching;Cramping    Pain Type  Chronic pain    Pain Onset  More than a month ago    Pain Frequency  Intermittent    Aggravating Factors   sitting    Pain Relieving Factors  PT    Effect of Pain on Daily Activities  limits positional tolerance         [facility] PT Assessment - 07/08/19 0001      Observation/Other Assessments   Focus on Therapeutic Outcomes (FOTO)   43% limited      Strength   Right Hip Flexion  4+/5    Right Hip Extension  4+/5    Right Hip External Rotation   4+/5    Right Hip Internal Rotation  5/5    Right Hip ABduction  4+/5    Right Hip ADduction  5/5    Right Knee Flexion  5/5    Right Knee Extension  5/5                   [facility] Adult PT Treatment/Exercise - 07/08/19 0001      Knee/Hip Exercises: Stretches   ITB Stretch  Right;3 reps;30 seconds    Piriformis Stretch  Right;3 reps;30 seconds    Other Knee/Hip Stretches  slant board stretch 3x30 seconds      Knee/Hip Exercises: Aerobic   Nustep  L4 x 5 min UE/LE      Knee/Hip Exercises: Standing   Hip Abduction  AROM;Stengthening;Right;Left;2 sets;10 reps;Knee straight    Abduction Limitations  with 2 lb. ankle weights    Lateral Step Up  Right;2 sets;10 reps;Hand  Hold: 2;Step Height: 4"    Forward Step Up  Right;2 sets;10 reps;Hand Hold: 1;Step Height: 6"    Step Down  Right;2 sets;10 reps;Hand Hold: 1;Step Height: 2"    Step Down Limitations  left side step-down with right foot on step    Functional Squat  2 sets;10 reps      Knee/Hip Exercises: Supine   Bridges with Ball Squeeze  AROM;Strengthening;Both;15 reps    Other Supine Knee/Hip Exercises  alt. unilat. clamshell green  band 2x10      Manual Therapy   Joint Mobilization  LAD right hip grade oscillations grade I-III    Soft tissue mobilization  STM/IASTM right posterolateral hip region including roller use       Trigger Point Dry Needling - 07/08/19 0001    Consent Given?  Yes    Muscles Treated Back/Hip  Gluteus minimus;Piriformis    Dry Needling Comments  needling x 5 min in left sidelying with 32 gauge 50 mm needles    Gluteus Medius Response  Twitch response elicited    Piriformis Response  Twitch response elicited           PT Education - 07/08/19 1045    Education Details  POC    Person(s) Educated  Patient    Methods  Explanation    Comprehension  Verbalized understanding       PT Short Term Goals - 07/08/19 1107      PT SHORT TERM GOAL #1   Title  Pt will be I with HEP for hip and back flexibilty, strength    Baseline  met with initial HEP, will update prn    Time  4    Period  Weeks    Status  Achieved      PT SHORT TERM GOAL #2   Title  Patient will demonstrate full pain free right hip AROM    Baseline  continues to have pain with end ranges     Time  4    Period  Weeks    Status  On-going        PT Long Term Goals - 07/08/19 1047      PT LONG TERM GOAL #1   Title  Patient will sleep through the night without increased pain , about 50% of the time    Baseline  met    Time  8    Period  Weeks    Status  Achieved      PT LONG TERM GOAL #2   Title  Pt will be able to walk through [facility] for 45 min with pain in back, hip no more than 3/10.     Baseline  met    Time  8    Period  Weeks    Status  Achieved      PT LONG TERM GOAL #3   Title  Patieatient witll demonstrate a 42% limitation on FOTO    Baseline  43% limited    Time  8    Period  Weeks    Status  On-going      PT LONG TERM GOAL #4   Title  Pt will become more aware of posture, lifting as she completes ADLs, work tasks (sitting less) and walks    Baseline  improving but ongoing    Time  8    Period  Weeks    Status  On-going      PT LONG TERM GOAL #5   Baseline  ongoing            Plan - 07/08/19 1101    Clinical Impression Statement  Pt. is making gradual improvements in decreased right hip pain and improving positional tolerance from baseline with 11% improvement in FOTO score from baseline, improving right hip strength, decreased subjective pain report and improving tolerance from baseline for prolonged ambulation for activities such as shopping. Still with functional limitations for prolonged sitting and for stair navigation particularly descending as well as walking on  uneven surfaces such as decline ramps. Pt. would benefit from continued PT for further progress to address remaining limitations. Will determine need for PT past original certification dates pending progress over the next 2-3 weeks.    Personal Factors and Comorbidities  Age;Comorbidity 1;Comorbidity 2;Past/Current Experience;Time since onset of injury/illness/exacerbation    Comorbidities  DVT 04/23/2019, Breast cancer, neuropathy, osteoporosis, depression    Examination-Activity Limitations  Squat;Stairs;Lift;Bed Mobility;Bend;Locomotion Level;Stand;Transfers;Sleep;Sit    Examination-Participation Restrictions  Shop;Cleaning;Yard Work;Meal Prep;Community Activity;Interpersonal Relationship    Stability/Clinical Decision Making  Evolving/Moderate complexity    Clinical Decision Making  Moderate    Rehab Potential  Good    PT Frequency  2x / week    PT Duration  8 weeks    PT  Treatment/Interventions  ADLs/Self Care Home Management;Electrical Stimulation;Therapeutic activities;Patient/family education;Therapeutic exercise;Moist Heat;Cryotherapy;Stair training;Neuromuscular re-education;Manual techniques;Dry needling;Balance training;Gait training;Taping;Functional mobility training    PT Next Visit Plan  Progress CKC hip strengthening as tolerated, continue focus standing exercises, further manual therapy prn, continue hip stretches, further dry needling prn    PT Home Exercise Plan  bridge, PPT, hip abd, hamstring and piriformis, tandem head turns and calf stretch for Rt post knee    Consulted and Agree with Plan of Care  Patient       Patient will benefit from skilled therapeutic intervention in order to improve the following deficits and impairments:  Increased fascial restricitons, Pain, Postural dysfunction, Decreased mobility, Decreased endurance, Decreased range of motion, Decreased strength, Impaired flexibility, Difficulty walking, Decreased balance  Visit Diagnosis: Pain in right hip  Other abnormalities of gait and mobility  Muscle weakness (generalized)  Abnormal posture  Sacroiliac joint pain     Problem List Patient Active Problem List   Diagnosis Date Noted  . Piriformis syndrome of right side 05/16/2019  . Lower extremity pain, right 04/23/2019  . DVT (deep venous thrombosis) ([place]) 04/23/2019  . Lumbar radiculopathy, right 04/02/2019  . Other long term (current) drug therapy 06/14/2017  . Anxiety and depression   . History of breast cancer 03/02/2017  . History of therapeutic radiation 03/02/2017  . Breast pain, right 10/03/2016  . Type 2 diabetes mellitus ([place]) 08/23/2016  . Hypertension associated with diabetes ([place]) 08/23/2016  . Hypothyroidism 08/23/2016  . Port catheter in place 05/09/2016  . Peripheral neuropathy 05/09/2016  . Breast cancer of upper-inner quadrant of right female breast ([place]) 03/09/2016  . Major depressive  disorder, recurrent ([place]) 02/27/2014  . Uncomplicated alcohol dependence ([place]) 02/19/2014    Beaulah Dinning, PT, DPT 07/08/19 11:08 AM  [facility] [address] [place], [place], [address] Phone: [phone]   Fax:  [phone]  Name: Marissa Hall MRN: [id number] Date of Birth: [birth year]

## 2019-07-10 ENCOUNTER — Ambulatory Visit: Payer: Medicare Other | Admitting: Physical Therapy

## 2019-07-10 ENCOUNTER — Encounter: Payer: Self-pay | Admitting: Physical Therapy

## 2019-07-10 ENCOUNTER — Other Ambulatory Visit: Payer: Self-pay

## 2019-07-10 DIAGNOSIS — M533 Sacrococcygeal disorders, not elsewhere classified: Secondary | ICD-10-CM | POA: Diagnosis not present

## 2019-07-10 DIAGNOSIS — M6281 Muscle weakness (generalized): Secondary | ICD-10-CM

## 2019-07-10 DIAGNOSIS — R293 Abnormal posture: Secondary | ICD-10-CM

## 2019-07-10 DIAGNOSIS — M25551 Pain in right hip: Secondary | ICD-10-CM

## 2019-07-10 DIAGNOSIS — R2689 Other abnormalities of gait and mobility: Secondary | ICD-10-CM | POA: Diagnosis not present

## 2019-07-10 NOTE — Therapy (Signed)
[place], [place], [address] Phone: [phone]   Fax:  [phone]  Physical Therapy Treatment  Patient Details  Name: Marissa Hall MRN: [id number] Date of Birth: [birth year] Referring Provider (PT): Dr. Hulan Saas    Encounter Date: 07/10/2019  PT End of Session - 07/10/19 1424    Visit Number  10    Number of Visits  16    Date for PT Re-Evaluation  07/30/19    Authorization Type  MCR, BCBS secondary, next progress note by visit 19    PT Start Time  1022   pt. arrived a few minutes late   PT Stop Time  1100    PT Time Calculation (min)  38 min    Activity Tolerance  Patient tolerated treatment well    Behavior During Therapy  WFL for tasks assessed/performed       Past Medical History:  Diagnosis Date  . Acute DVT (deep venous thrombosis) ([place]) 04/23/2019   Right popliteal  . Anxiety and depression 1964   oncologist started duloxetine 12/2016  . Breast cancer ([place]) 03/14/2016   Clinical stage 2A: (triple neg): Right breast, upper inner quadrant, 03/2016.  Neoadjuvant chemo x 5 cycles,lumpectomy 4 mo later, then RT started 10/2016.  Adjuvant Xeloda [id number].  SWOG research trial pt 04/2017--pt randomized to pembrolizumab immunotherapy.  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  [name] Kitchen Cataracts, bilateral 07/2017  . 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 Depression 1964   Patient states since age 62  . Diabetes mellitus with complication ([place]) 6283   managed by endocrinology.  A1c Mar 12, 2018 was 7.0% at Dr. Shirlyn Goltz.   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  Excision done 02/2019  . GERD (gastroesophageal reflux disease) 2013  . Hyperlipidemia 1986  . Hypertension 2008  . Hypothyroidism 1988   Diagnosed in her 20s.  Managed by  Endocrinologist  . Lumbar radiculopathy 04/2019   Dr. Tamala Julian to get plain films of LB and hip (considering MRI due to her hx of cancer)  . Osteoporosis 2015   pt states "osteopenia", but then says that she refused to take the rx med for this condition, so I suspect she had osteoporosis.  . Peripheral neuropathy 2017   Patient states diabetic neuropathy in feet prior to starting chemotherapy and then worsened by chemo.   [name] Kitchen TIA (transient ischemic attack) 03/26/2011    Past Surgical History:  Procedure Laterality Date  . ABDOMINAL HYSTERECTOMY  1972  . APPENDECTOMY  1972  . BREAST ENHANCEMENT SURGERY  1982  . BREAST IMPLANT REMOVAL Right 09/13/2016   Procedure: REMOVAL RIGHT BREAST IMPLANT;  Surgeon: Irene Limbo, MD;  Location: [facility];  Service: Plastics;  Laterality: Right;  . BREAST LUMPECTOMY WITH RADIOACTIVE SEED AND SENTINEL LYMPH NODE BIOPSY Right 09/13/2016   Procedure: RIGHT BREAST LUMPECTOMY WITH RADIOACTIVE SEED X 2 AND SENTINEL LYMPH NODE BIOPSY;  Surgeon: Alphonsa Overall, MD;  Location: [facility];  Service: General;  Laterality: Right;  . BREAST SURGERY Right 03/14/2016   Biopsy  . CAPSULECTOMY Right 09/13/2016   Procedure: RIGHT CAPSULECTOMY;  Surgeon: Irene Limbo, MD;  Location: [facility];  Service: Plastics;  Laterality: Right;  . CATARACT EXTRACTION, BILATERAL Bilateral 08/10/17 right eye, 08/31/17 left eye  . MASS  EXCISION Left 02/28/2018   Path: benign.  Procedure: EXCISIONLEFT MEDIAL THIGH MASS ERAS PATHWAY;  Surgeon: Erroll Luna, MD;  Location: [facility];  Service: General;  Laterality: Left;  . PORTACATH PLACEMENT N/A 03/15/2016   Procedure: INSERTION PORT-A-CATH WITH [place];  Surgeon: Alphonsa Overall, MD;  [facility];  Service: General;  Laterality: N/A;  . PORTACATH REMOVAL  07/2017  . surgical repair left ankle Left 2009   s/p Fall   . TONSILLECTOMY AND ADENOIDECTOMY  1948   Age 89    There  were no vitals filed for this visit.  Subjective Assessment - 07/10/19 1424    Subjective  No hip pain pre-tx., weakness is primary factor causing functional limitations.    Pertinent History  Neuropathy, R knee pain.    Currently in Pain?  No/denies                       [facility] Adult PT Treatment/Exercise - 07/10/19 0001      Knee/Hip Exercises: Stretches   ITB Stretch  Right;3 reps;30 seconds    Piriformis Stretch  Right;3 reps;30 seconds    Other Knee/Hip Stretches  right glut stretch 30 sec x 3      Knee/Hip Exercises: Aerobic   Nustep  L5 x 5 min UE/LE      Knee/Hip Exercises: Standing   Hip Abduction  AROM;Stengthening;Right;Left;2 sets;10 reps;Knee straight    Abduction Limitations  with 2 lb. ankle weights    Lateral Step Up  Right;2 sets;10 reps;Hand Hold: 1;Step Height: 4"    Forward Step Up  Right;2 sets;10 reps;Hand Hold: 1;Step Height: 6"    Step Down  Right;2 sets;10 reps;Hand Hold: 1;Step Height: 2"   visual cues/demo for form   Step Down Limitations  left side step-down with right foot on step    Functional Squat  --    Functional Squat Limitations  squat at counter-13 reps stopped due to right lateral knee soreness    Other Standing Knee Exercises  hip hike with right foot on 4 in. step x 10 reps visual cues/demo for form      Manual Therapy   Joint Mobilization  LAD right hip grade oscillations grade I-III    Soft tissue mobilization  STM/IASTM right posterolateral hip region including roller use               PT Short Term Goals - 07/08/19 1107      PT SHORT TERM GOAL #1   Title  Pt will be I with HEP for hip and back flexibilty, strength    Baseline  met with initial HEP, will update prn    Time  4    Period  Weeks    Status  Achieved      PT SHORT TERM GOAL #2   Title  Patient will demonstrate full pain free right hip AROM    Baseline  continues to have pain with end ranges     Time  4    Period  Weeks    Status  On-going         PT Long Term Goals - 07/08/19 1047      PT LONG TERM GOAL #1   Title  Patient will sleep through the night without increased pain , about 50% of the time    Baseline  met    Time  8    Period  Weeks    Status  Achieved  PT LONG TERM GOAL #2   Title  Pt will be able to walk through [facility] for 45 min with pain in back, hip no more than 3/10.    Baseline  met    Time  8    Period  Weeks    Status  Achieved      PT LONG TERM GOAL #3   Title  Patieatient witll demonstrate a 42% limitation on FOTO    Baseline  43% limited    Time  8    Period  Weeks    Status  On-going      PT LONG TERM GOAL #4   Title  Pt will become more aware of posture, lifting as she completes ADLs, work tasks (sitting less) and walks    Baseline  improving but ongoing    Time  8    Period  Weeks    Status  On-going      PT LONG TERM GOAL #5   Baseline  ongoing            Plan - 07/10/19 1435    Clinical Impression Statement  Right quad weakness noted with stepping exercises/in closed-chain with tendency for knee to feel like it would buckle but overall continues to progress with improving hip pain, strength gains and walking + positional tolerance. Some pain in knee limiting ability for squats today otherwise session well-tolerated.    Personal Factors and Comorbidities  Age;Comorbidity 1;Comorbidity 2;Past/Current Experience;Time since onset of injury/illness/exacerbation    Comorbidities  DVT 04/23/2019, Breast cancer, neuropathy, osteoporosis, depression    Examination-Activity Limitations  Squat;Stairs;Lift;Bed Mobility;Bend;Locomotion Level;Stand;Transfers;Sleep;Sit    Examination-Participation Restrictions  Shop;Cleaning;Yard Work;Meal Prep;Community Activity;Interpersonal Relationship    Stability/Clinical Decision Making  Evolving/Moderate complexity    Clinical Decision Making  Moderate    Rehab Potential  Good    PT Frequency  2x / week    PT Duration  8 weeks    PT  Treatment/Interventions  ADLs/Self Care Home Management;Electrical Stimulation;Therapeutic activities;Patient/family education;Therapeutic exercise;Moist Heat;Cryotherapy;Stair training;Neuromuscular re-education;Manual techniques;Dry needling;Balance training;Gait training;Taping;Functional mobility training    PT Next Visit Plan  Progress CKC hip strengthening as tolerated, continue focus standing exercises, further manual therapy prn, continue hip stretches, further dry needling prn    PT Home Exercise Plan  bridge, PPT, hip abd, hamstring and piriformis, tandem head turns and calf stretch for Rt post knee    Consulted and Agree with Plan of Care  Patient       Patient will benefit from skilled therapeutic intervention in order to improve the following deficits and impairments:  Increased fascial restricitons, Pain, Postural dysfunction, Decreased mobility, Decreased endurance, Decreased range of motion, Decreased strength, Impaired flexibility, Difficulty walking, Decreased balance  Visit Diagnosis: Pain in right hip  Other abnormalities of gait and mobility  Muscle weakness (generalized)  Abnormal posture  Sacroiliac joint pain     Problem List Patient Active Problem List   Diagnosis Date Noted  . Piriformis syndrome of right side 05/16/2019  . Lower extremity pain, right 04/23/2019  . DVT (deep venous thrombosis) ([place]) 04/23/2019  . Lumbar radiculopathy, right 04/02/2019  . Other long term (current) drug therapy 06/14/2017  . Anxiety and depression   . History of breast cancer 03/02/2017  . History of therapeutic radiation 03/02/2017  . Breast pain, right 10/03/2016  . Type 2 diabetes mellitus ([place]) 08/23/2016  . Hypertension associated with diabetes ([place]) 08/23/2016  . Hypothyroidism 08/23/2016  . Port catheter in place 05/09/2016  . Peripheral neuropathy  05/09/2016  . Breast cancer of upper-inner quadrant of right female breast ([place]) 03/09/2016  . Major depressive  disorder, recurrent ([place]) 02/27/2014  . Uncomplicated alcohol dependence ([place]) 02/19/2014    Beaulah Dinning, PT, DPT 07/10/19 3:01 PM  [facility] [address] [place], [place], [address] Phone: [phone]   Fax:  [phone]  Name: Marissa Hall MRN: [id number] Date of Birth: 11/02/1940

## 2019-07-15 ENCOUNTER — Ambulatory Visit: Payer: Medicare Other | Admitting: Physical Therapy

## 2019-07-15 ENCOUNTER — Other Ambulatory Visit: Payer: Self-pay

## 2019-07-15 ENCOUNTER — Encounter: Payer: Self-pay | Admitting: Physical Therapy

## 2019-07-15 DIAGNOSIS — R2689 Other abnormalities of gait and mobility: Secondary | ICD-10-CM

## 2019-07-15 DIAGNOSIS — M25551 Pain in right hip: Secondary | ICD-10-CM

## 2019-07-15 DIAGNOSIS — R293 Abnormal posture: Secondary | ICD-10-CM | POA: Diagnosis not present

## 2019-07-15 DIAGNOSIS — M6281 Muscle weakness (generalized): Secondary | ICD-10-CM | POA: Diagnosis not present

## 2019-07-15 DIAGNOSIS — M533 Sacrococcygeal disorders, not elsewhere classified: Secondary | ICD-10-CM | POA: Diagnosis not present

## 2019-07-15 NOTE — Therapy (Signed)
[place], [place], [address] Phone: [phone]   Fax:  [phone]  Physical Therapy Treatment  Patient Details  Name: Marissa Hall MRN: [id number] Date of Birth: 14-Sep-1940 Referring Provider (PT): Dr. Hulan Saas    Encounter Date: 07/15/2019  PT End of Session - 07/15/19 1617    Visit Number  11    Number of Visits  16    Date for PT Re-Evaluation  07/30/19    Authorization Type  MCR, BCBS secondary, next progress note by visit 19    PT Start Time  1545    PT Stop Time  1629   6 min spent dry needling not included in direct minutes   PT Time Calculation (min)  44 min    Activity Tolerance  Patient tolerated treatment well    Behavior During Therapy  [facility] for tasks assessed/performed       Past Medical History:  Diagnosis Date  . Acute DVT (deep venous thrombosis) ([place]) 04/23/2019   Right popliteal  . Anxiety and depression 1964   oncologist started duloxetine 12/2016  . Breast cancer ([place]) 03/14/2016   Clinical stage 2A: (triple neg): Right breast, upper inner quadrant, 03/2016.  Neoadjuvant chemo x 5 cycles,lumpectomy 4 mo later, then RT started 10/2016.  Adjuvant Xeloda [id number].  SWOG research trial pt 04/2017--pt randomized to pembrolizumab immunotherapy.  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  [name] Kitchen Cataracts, bilateral 07/2017  . Chemotherapy-induced neuropathy ([place]) 07/04/2016   feet; responding well to cymbalta  . Depression 1964   Patient states since age 79  . Diabetes mellitus with complication ([place]) 8250   managed by endocrinology.  A1c Mar 12, 2018 was 7.0% at Dr. Shirlyn Goltz.   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  Excision done 02/2019  . GERD (gastroesophageal reflux disease) 2013  . Hyperlipidemia 1986  . Hypertension 2008  . Hypothyroidism 1988   Diagnosed in  her 42s.  Managed by Endocrinologist  . Lumbar radiculopathy 04/2019   Dr. Tamala Julian to get plain films of LB and hip (considering MRI due to her hx of cancer)  . Osteoporosis 2015   pt states "osteopenia", but then says that she refused to take the rx med for this condition, so I suspect she had osteoporosis.  . Peripheral neuropathy 2017   Patient states diabetic neuropathy in feet prior to starting chemotherapy and then worsened by chemo.   [name] Kitchen TIA (transient ischemic attack) 03/26/2011    Past Surgical History:  Procedure Laterality Date  . ABDOMINAL HYSTERECTOMY  1972  . APPENDECTOMY  1972  . BREAST ENHANCEMENT SURGERY  1982  . BREAST IMPLANT REMOVAL Right 09/13/2016   Procedure: REMOVAL RIGHT BREAST IMPLANT;  Surgeon: Irene Limbo, MD;  Location: [place];  Service: Plastics;  Laterality: Right;  . BREAST LUMPECTOMY WITH RADIOACTIVE SEED AND SENTINEL LYMPH NODE BIOPSY Right 09/13/2016   Procedure: RIGHT BREAST LUMPECTOMY WITH RADIOACTIVE SEED X 2 AND SENTINEL LYMPH NODE BIOPSY;  Surgeon: Alphonsa Overall, MD;  Location: [place];  Service: General;  Laterality: Right;  . BREAST SURGERY Right 03/14/2016   Biopsy  . CAPSULECTOMY Right 09/13/2016   Procedure: RIGHT CAPSULECTOMY;  Surgeon: Irene Limbo, MD;  Location: [place];  Service: Plastics;  Laterality: Right;  . CATARACT EXTRACTION, BILATERAL Bilateral 08/10/17 right eye, 08/31/17 left  eye  . MASS EXCISION Left 02/28/2018   Path: benign.  Procedure: EXCISIONLEFT MEDIAL THIGH MASS ERAS PATHWAY;  Surgeon: Erroll Luna, MD;  Location: [place];  Service: General;  Laterality: Left;  . PORTACATH PLACEMENT N/A 03/15/2016   Procedure: INSERTION PORT-A-CATH WITH [place];  Surgeon: Alphonsa Overall, MD;  [facility];  Service: General;  Laterality: N/A;  . PORTACATH REMOVAL  07/2017  . surgical repair left ankle Left 2009   s/p Fall   . TONSILLECTOMY AND ADENOIDECTOMY  1948    Age 79    There were no vitals filed for this visit.  Subjective Assessment - 07/15/19 1547    Subjective  Pt. reports right knee pain has been exacerbated since yesterday without specific cause. Knee feels weak. Pt. had been offered injection by Dr. Tamala Julian for knee and had been hesitant to get-discussed if continues to be limiting to follow up with Dr. Tamala Julian.    Currently in Pain?  Yes    Pain Score  4     Pain Location  Knee    Pain Orientation  Right    Pain Descriptors / Indicators  Sharp    Pain Type  Chronic pain    Pain Onset  In the past 7 days    Pain Frequency  Intermittent    Aggravating Factors   stepping    Pain Relieving Factors  rest    Effect of Pain on Daily Activities  limits standing and walking tolerance                       [facility] Adult PT Treatment/Exercise - 07/15/19 0001      Knee/Hip Exercises: Stretches   ITB Stretch  Right;3 reps;30 seconds    Piriformis Stretch  Right;3 reps;30 seconds      Knee/Hip Exercises: Supine   Quad Sets  AROM;Strengthening;Right;2 sets;10 reps    Quad Sets Limitations  with towel roll under knee    Short Arc Quad Sets  AROM;Right;2 sets;10 reps    Heel Slides  AROM;Right;20 reps    Bridges  AROM;Strengthening;Both;2 sets;10 reps    Bridges Limitations  legs on bolster roll    Other Supine Knee/Hip Exercises  hip adduction isometric in hooklying 2x10 3-5 sec holds      Manual Therapy   Joint Mobilization  LAD right hip grade oscillations grade I-III    Soft tissue mobilization  STM/IASTM right posterolateral hip region including roller use       Trigger Point Dry Needling - 07/15/19 0001    Consent Given?  Yes    Muscles Treated Back/Hip  Gluteus medius;Piriformis;Tensor fascia lata    Dry Needling Comments  needling x 6 min total in left sidelying with 32 gauge 50 mm needles    Gluteus Medius Response  Twitch response elicited    Piriformis Response  Twitch response elicited           PT  Education - 07/15/19 1620    Education Details  potential symptom etiology for knee, exercises, POC    Person(s) Educated  Patient    Methods  Explanation;Verbal cues    Comprehension  Verbalized understanding;Returned demonstration       PT Short Term Goals - 07/08/19 1107      PT SHORT TERM GOAL #1   Title  Pt will be I with HEP for hip and back flexibilty, strength    Baseline  met with initial HEP, will update prn  Time  4    Period  Weeks    Status  Achieved      PT SHORT TERM GOAL #2   Title  Patient will demonstrate full pain free right hip AROM    Baseline  continues to have pain with end ranges     Time  4    Period  Weeks    Status  On-going        PT Long Term Goals - 07/08/19 1047      PT LONG TERM GOAL #1   Title  Patient will sleep through the night without increased pain , about 50% of the time    Baseline  met    Time  8    Period  Weeks    Status  Achieved      PT LONG TERM GOAL #2   Title  Pt will be able to walk through [facility] for 45 min with pain in back, hip no more than 3/10.    Baseline  met    Time  8    Period  Weeks    Status  Achieved      PT LONG TERM GOAL #3   Title  Patieatient witll demonstrate a 42% limitation on FOTO    Baseline  43% limited    Time  8    Period  Weeks    Status  On-going      PT LONG TERM GOAL #4   Title  Pt will become more aware of posture, lifting as she completes ADLs, work tasks (sitting less) and walks    Baseline  improving but ongoing    Time  8    Period  Weeks    Status  On-going      PT LONG TERM GOAL #5   Baseline  ongoing            Plan - 07/15/19 1620    Clinical Impression Statement  Some setback today due to knee soreness-pt. has past imaging findings of multi-compartmental OA particularly in lateral compartment so would suspect pain OA related. Held standing/closed chain activities today due to knee pain and to avoid further exacerbation of symptoms but open chain/mat-based ROM  and strengthening was well-tolerated. If knee pain persisted recommend pt. to follow up with Dr. Tamala Julian for tx. options.    Personal Factors and Comorbidities  Age;Comorbidity 1;Comorbidity 2;Past/Current Experience;Time since onset of injury/illness/exacerbation    Comorbidities  DVT 04/23/2019, Breast cancer, neuropathy, osteoporosis, depression    Examination-Activity Limitations  Squat;Stairs;Lift;Bed Mobility;Bend;Locomotion Level;Stand;Transfers;Sleep;Sit    Examination-Participation Restrictions  Shop;Cleaning;Yard Work;Meal Prep;Community Activity;Interpersonal Relationship    Stability/Clinical Decision Making  Evolving/Moderate complexity    Clinical Decision Making  Moderate    Rehab Potential  Good    PT Frequency  2x / week    PT Duration  8 weeks    PT Treatment/Interventions  ADLs/Self Care Home Management;Electrical Stimulation;Therapeutic activities;Patient/family education;Therapeutic exercise;Moist Heat;Cryotherapy;Stair training;Neuromuscular re-education;Manual techniques;Dry needling;Balance training;Gait training;Taping;Functional mobility training    PT Next Visit Plan  Monitor knee pain, progress CKC hip strengthening as tolerated, continue focus standing exercises, further manual therapy prn, continue hip stretches, further dry needling prn    PT Home Exercise Plan  bridge, PPT, hip abd, hamstring and piriformis, tandem head turns and calf stretch for Rt post knee    Consulted and Agree with Plan of Care  Patient       Patient will benefit from skilled therapeutic intervention in order to improve the following  deficits and impairments:  Increased fascial restricitons, Pain, Postural dysfunction, Decreased mobility, Decreased endurance, Decreased range of motion, Decreased strength, Impaired flexibility, Difficulty walking, Decreased balance  Visit Diagnosis: Pain in right hip  Other abnormalities of gait and mobility  Muscle weakness (generalized)  Abnormal  posture  Sacroiliac joint pain     Problem List Patient Active Problem List   Diagnosis Date Noted  . Piriformis syndrome of right side 05/16/2019  . Lower extremity pain, right 04/23/2019  . DVT (deep venous thrombosis) ([place]) 04/23/2019  . Lumbar radiculopathy, right 04/02/2019  . Other long term (current) drug therapy 06/14/2017  . Anxiety and depression   . History of breast cancer 03/02/2017  . History of therapeutic radiation 03/02/2017  . Breast pain, right 10/03/2016  . Type 2 diabetes mellitus ([place]) 08/23/2016  . Hypertension associated with diabetes ([place]) 08/23/2016  . Hypothyroidism 08/23/2016  . Port catheter in place 05/09/2016  . Peripheral neuropathy 05/09/2016  . Breast cancer of upper-inner quadrant of right female breast ([place]) 03/09/2016  . Major depressive disorder, recurrent ([place]) 02/27/2014  . Uncomplicated alcohol dependence ([place]) 02/19/2014    Beaulah Dinning, PT, DPT 07/15/19 4:30 PM   [facility] [address] [place], [place], [address] Phone: [phone]   Fax:  [phone]  Name: Ulanda Tackett MRN: [id number] Date of Birth: Mar 19, 1941

## 2019-07-17 ENCOUNTER — Ambulatory Visit: Payer: Medicare Other | Admitting: Physical Therapy

## 2019-07-17 ENCOUNTER — Encounter: Payer: Self-pay | Admitting: Physical Therapy

## 2019-07-17 ENCOUNTER — Other Ambulatory Visit: Payer: Self-pay

## 2019-07-17 DIAGNOSIS — R2689 Other abnormalities of gait and mobility: Secondary | ICD-10-CM | POA: Diagnosis not present

## 2019-07-17 DIAGNOSIS — M533 Sacrococcygeal disorders, not elsewhere classified: Secondary | ICD-10-CM

## 2019-07-17 DIAGNOSIS — M25551 Pain in right hip: Secondary | ICD-10-CM

## 2019-07-17 DIAGNOSIS — R293 Abnormal posture: Secondary | ICD-10-CM

## 2019-07-17 DIAGNOSIS — M6281 Muscle weakness (generalized): Secondary | ICD-10-CM

## 2019-07-17 NOTE — Therapy (Signed)
[place], [place], [address] Phone: [phone]   Fax:  [phone]  Physical Therapy Treatment  Patient Details  Name: Marissa Hall MRN: [id number] Date of Birth: January 17, 1941 Referring Provider (PT): Dr. Hulan Saas    Encounter Date: 07/17/2019  PT End of Session - 07/17/19 2033    Visit Number  12    Number of Visits  16    Date for PT Re-Evaluation  07/30/19    Authorization Type  MCR, BCBS secondary, next progress note by visit 19    PT Start Time  1548    PT Stop Time  1628    PT Time Calculation (min)  40 min    Activity Tolerance  Patient tolerated treatment well    Behavior During Therapy  [facility] for tasks assessed/performed       Past Medical History:  Diagnosis Date  . Acute DVT (deep venous thrombosis) ([place]) 04/23/2019   Right popliteal  . Anxiety and depression 1964   oncologist started duloxetine 12/2016  . Breast cancer ([place]) 03/14/2016   Clinical stage 2A: (triple neg): Right breast, upper inner quadrant, 03/2016.  Neoadjuvant chemo x 5 cycles,lumpectomy 4 mo later, then RT started 10/2016.  Adjuvant Xeloda [id number].  SWOG research trial pt 04/2017--pt randomized to pembrolizumab immunotherapy.  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  [name] Kitchen Cataracts, bilateral 07/2017  . Chemotherapy-induced neuropathy ([place]) 07/04/2016   feet; responding well to cymbalta  . Depression 1964   Patient states since age 26  . Diabetes mellitus with complication ([place]) 1696   managed by endocrinology.  A1c Mar 12, 2018 was 7.0% at Dr. Shirlyn Goltz.   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  Excision done 02/2019  . GERD (gastroesophageal reflux disease) 2013  . Hyperlipidemia 1986  . Hypertension 2008  . Hypothyroidism 1988   Diagnosed in her 49s.  Managed by Endocrinologist  . Lumbar  radiculopathy 04/2019   Dr. Tamala Julian to get plain films of LB and hip (considering MRI due to her hx of cancer)  . Osteoporosis 2015   pt states "osteopenia", but then says that she refused to take the rx med for this condition, so I suspect she had osteoporosis.  . Peripheral neuropathy 2017   Patient states diabetic neuropathy in feet prior to starting chemotherapy and then worsened by chemo.   [name] Kitchen TIA (transient ischemic attack) 03/26/2011    Past Surgical History:  Procedure Laterality Date  . ABDOMINAL HYSTERECTOMY  1972  . APPENDECTOMY  1972  . BREAST ENHANCEMENT SURGERY  1982  . BREAST IMPLANT REMOVAL Right 09/13/2016   Procedure: REMOVAL RIGHT BREAST IMPLANT;  Surgeon: Irene Limbo, MD;  Location: [place];  Service: Plastics;  Laterality: Right;  . BREAST LUMPECTOMY WITH RADIOACTIVE SEED AND SENTINEL LYMPH NODE BIOPSY Right 09/13/2016   Procedure: RIGHT BREAST LUMPECTOMY WITH RADIOACTIVE SEED X 2 AND SENTINEL LYMPH NODE BIOPSY;  Surgeon: Alphonsa Overall, MD;  Location: [place];  Service: General;  Laterality: Right;  . BREAST SURGERY Right 03/14/2016   Biopsy  . CAPSULECTOMY Right 09/13/2016   Procedure: RIGHT CAPSULECTOMY;  Surgeon: Irene Limbo, MD;  Location: [place];  Service: Plastics;  Laterality: Right;  . CATARACT EXTRACTION, BILATERAL Bilateral 08/10/17 right eye, 08/31/17 left eye  . MASS EXCISION Left 02/28/2018   Path: benign.  Procedure: EXCISIONLEFT MEDIAL THIGH MASS ERAS PATHWAY;  Surgeon: Erroll Luna, MD;  Location: [place];  Service: General;  Laterality: Left;  . PORTACATH PLACEMENT N/A 03/15/2016   Procedure: INSERTION PORT-A-CATH WITH [place];  Surgeon: Alphonsa Overall, MD;  [facility];  Service: General;  Laterality: N/A;  . PORTACATH REMOVAL  07/2017  . surgical repair left ankle Left 2009   s/p Fall   . TONSILLECTOMY AND ADENOIDECTOMY  1948   Age 41    There were no vitals filed for this  visit.  Subjective Assessment - 07/17/19 1550    Subjective  Hip doing well but still having issues with right knee pain. Discussed status and tentatively plan hold further PT for now given hip improved and pt. will follow up with MD for further tx.options for knee (MD had previously offered injection but pt. had previously declined). See plan.    Pertinent History  Neuropathy, R knee pain.    Limitations  Sitting    Patient Stated Goals  Patient would like to get out of this pain.                       [facility] Adult PT Treatment/Exercise - 07/17/19 0001      Lumbar Exercises: Aerobic   Nustep  L4 x 5 min UE/LE      Knee/Hip Exercises: Stretches   ITB Stretch  Right;3 reps;30 seconds    Piriformis Stretch  Right;3 reps;30 seconds      Knee/Hip Exercises: Aerobic   Nustep  L4 x 5 min      Knee/Hip Exercises: Seated   Long Arc Quad  AROM;Strengthening;Right;2 sets;10 reps    Long Arc Quad Limitations  Green band      Knee/Hip Exercises: Supine   Short Arc [employer]  AROM;Strengthening;Right;2 sets;10 reps    Short Arc Quad Sets Limitations  2 lbs.    Bridges  AROM;Strengthening;Both;2 sets;10 reps    Bridges Limitations  legs on bolster roll    Other Supine Knee/Hip Exercises  Review HEP updates-see chart copy      Manual Therapy   Soft tissue mobilization  STM/IASTM right posterolateral hip region including roller use             PT Education - 07/17/19 2033    Education Details  POC, HEP updates    Person(s) Educated  Patient    Methods  Explanation;Demonstration;Verbal cues;Handout    Comprehension  Verbalized understanding;Returned demonstration       PT Short Term Goals - 07/08/19 1107      PT SHORT TERM GOAL #1   Title  Pt will be I with HEP for hip and back flexibilty, strength    Baseline  met with initial HEP, will update prn    Time  4    Period  Weeks    Status  Achieved      PT SHORT TERM GOAL #2   Title  Patient will demonstrate  full pain free right hip AROM    Baseline  continues to have pain with end ranges     Time  4    Period  Weeks    Status  On-going        PT Long Term Goals - 07/08/19 1047      PT LONG TERM GOAL #1   Title  Patient will sleep through the night without increased pain , about 50% of the time    Baseline  met    Time  8    Period  Weeks    Status  Achieved      PT LONG TERM GOAL #2   Title  Pt will be able to walk through [facility] for 45 min with pain in back, hip no more than 3/10.    Baseline  met    Time  8    Period  Weeks    Status  Achieved      PT LONG TERM GOAL #3   Title  Patieatient witll demonstrate a 42% limitation on FOTO    Baseline  43% limited    Time  8    Period  Weeks    Status  On-going      PT LONG TERM GOAL #4   Title  Pt will become more aware of posture, lifting as she completes ADLs, work tasks (sitting less) and walks    Baseline  improving but ongoing    Time  8    Period  Weeks    Status  On-going      PT LONG TERM GOAL #5   Baseline  ongoing            Plan - 07/17/19 2034    Clinical Impression Statement  Pt. doing well with regard to right hip and LBP. Her right knee continues to be a limiting factor intermittently for mobility with underlying multi-compartmental OA worst in lateral compartment. Given improvement in hip and discussing status with pt. plan is to try continuing with HEP for a few weeks with return prn for receritifcation. If her knee continues to be a limiting factor recommend follow up with Dr. Tamala Julian for further tx. option.    Personal Factors and Comorbidities  Age;Comorbidity 1;Comorbidity 2;Past/Current Experience;Time since onset of injury/illness/exacerbation    Comorbidities  DVT 04/23/2019, Breast cancer, neuropathy, osteoporosis, depression    Examination-Activity Limitations  Squat;Stairs;Lift;Bed Mobility;Bend;Locomotion Level;Stand;Transfers;Sleep;Sit    Examination-Participation Restrictions   Shop;Cleaning;Yard Work;Meal Prep;Community Activity;Interpersonal Relationship    Stability/Clinical Decision Making  Evolving/Moderate complexity    Clinical Decision Making  Moderate    Rehab Potential  Good    PT Frequency  2x / week    PT Duration  8 weeks    PT Treatment/Interventions  ADLs/Self Care Home Management;Electrical Stimulation;Therapeutic activities;Patient/family education;Therapeutic exercise;Moist Heat;Cryotherapy;Stair training;Neuromuscular re-education;Manual techniques;Dry needling;Balance training;Gait training;Taping;Functional mobility training    PT Next Visit Plan  If returning further tx. to hip as needed, hip extensor and abductor + core strengthening, monitor knee pain, further dry needling prn    PT Home Exercise Plan  hip bridge, PPT, quad sets, hip abd and supine SLRs, SAQ, heel slides, LAQ with Theraband, piriformis stretches prn, if tolerated with knee pain partial squat and step up    Consulted and Agree with Plan of Care  Patient       Patient will benefit from skilled therapeutic intervention in order to improve the following deficits and impairments:  Increased fascial restricitons, Pain, Postural dysfunction, Decreased mobility, Decreased endurance, Decreased range of motion, Decreased strength, Impaired flexibility, Difficulty walking, Decreased balance  Visit Diagnosis: Pain in right hip  Other abnormalities of gait and mobility  Muscle weakness (generalized)  Abnormal posture  Sacroiliac joint pain     Problem List Patient Active Problem List   Diagnosis Date Noted  . Piriformis syndrome of right side 05/16/2019  . Lower extremity pain, right 04/23/2019  . DVT (deep venous thrombosis) ([place]) 04/23/2019  . Lumbar radiculopathy, right 04/02/2019  .  Other long term (current) drug therapy 06/14/2017  . Anxiety and depression   . History of breast cancer 03/02/2017  . History of therapeutic radiation 03/02/2017  . Breast pain, right  10/03/2016  . Type 2 diabetes mellitus ([place]) 08/23/2016  . Hypertension associated with diabetes ([place]) 08/23/2016  . Hypothyroidism 08/23/2016  . Port catheter in place 05/09/2016  . Peripheral neuropathy 05/09/2016  . Breast cancer of upper-inner quadrant of right female breast ([place]) 03/09/2016  . Major depressive disorder, recurrent ([place]) 02/27/2014  . Uncomplicated alcohol dependence ([place]) 02/19/2014    Beaulah Dinning, PT, DPT 07/17/19 8:40 PM  [facility] [address] [place], [place], [address] Phone: [phone]   Fax:  [phone]  Name: Marissa Hall MRN: [id number] Date of Birth: 17-Jul-1940

## 2019-07-21 ENCOUNTER — Ambulatory Visit: Payer: Medicare Other | Attending: Internal Medicine

## 2019-07-21 DIAGNOSIS — Z23 Encounter for immunization: Secondary | ICD-10-CM

## 2019-07-21 NOTE — Progress Notes (Signed)
   Covid-19 Vaccination Clinic  Name:  Marissa Hall    MRN: [id number] DOB: 1941/02/27  07/21/2019  [name] was observed post Covid-19 immunization for 15 minutes without incidence. She was provided with Vaccine Information Sheet and instruction to access the V-Safe system.   [name] was instructed to call 911 with any severe reactions post vaccine: [name] Kitchen Difficulty breathing  . Swelling of your face and throat  . A fast heartbeat  . A bad rash all over your body  . Dizziness and weakness    Immunizations Administered    Name Date Dose VIS Date Route   Pfizer COVID-19 Vaccine 07/21/2019 11:22 AM 0.3 mL 05/17/2019 Intramuscular   Manufacturer: [place]   Lot: [id number]   [place]: [id number]

## 2019-07-31 ENCOUNTER — Other Ambulatory Visit: Payer: Self-pay | Admitting: Hematology and Oncology

## 2019-07-31 MED FILL — XARELTO 20 MG TABLET: 20 | 30 days supply | Qty: 30 | Fill #0

## 2019-08-01 DIAGNOSIS — F332 Major depressive disorder, recurrent severe without psychotic features: Secondary | ICD-10-CM | POA: Diagnosis not present

## 2019-08-13 ENCOUNTER — Ambulatory Visit: Payer: Medicare Other | Attending: Internal Medicine

## 2019-08-13 DIAGNOSIS — Z23 Encounter for immunization: Secondary | ICD-10-CM

## 2019-08-13 NOTE — Progress Notes (Signed)
   Covid-19 Vaccination Clinic  Name:  Marissa Hall    MRN: [id number] DOB: 02-10-1941  08/13/2019  Ms. [name] was observed post Covid-19 immunization for 15 minutes without incident. She was provided with Vaccine Information Sheet and instruction to access the V-Safe system.   Ms. [name] was instructed to call 911 with any severe reactions post vaccine: [name] Kitchen Difficulty breathing  . Swelling of face and throat  . A fast heartbeat  . A bad rash all over body  . Dizziness and weakness   Immunizations Administered    Name Date Dose VIS Date Route   Pfizer COVID-19 Vaccine 08/13/2019  2:11 PM 0.3 mL 05/17/2019 Intramuscular   Manufacturer: [place]   Lot: [id number]   [place]: [id number]

## 2019-08-19 NOTE — Therapy (Signed)
[place], [place], [address] Phone: [phone]   Fax:  [phone]  Physical Therapy Treatment/Discharge  Patient Details  Name: Marissa Hall MRN: [id number] Date of Birth: 1940-10-26 Referring Provider (PT): Dr. Hulan Saas    Encounter Date: 07/17/2019    Past Medical History:  Diagnosis Date  . Acute DVT (deep venous thrombosis) ([facility] [facility]) 04/23/2019   Right popliteal  . Anxiety and depression 1964   oncologist started duloxetine 12/2016  . Breast cancer ([place]) 03/14/2016   Clinical stage 2A: (triple neg): Right breast, upper inner quadrant, 03/2016.  Neoadjuvant chemo x 5 cycles,lumpectomy 4 mo later, then RT started 10/2016.  Adjuvant Xeloda [id number].  SWOG research trial pt 04/2017--pt randomized to pembrolizumab immunotherapy.  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  [name] Kitchen Cataracts, bilateral 07/2017  . Chemotherapy-induced neuropathy ([facility]) 07/04/2016   feet; responding well to cymbalta  . Depression 1964   Patient states since age 34  . Diabetes mellitus with complication ([facility]) 8832   managed by endocrinology.  A1c Mar 12, 2018 was 7.0% at Dr. Shirlyn Goltz.   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  Excision done 02/2019  . GERD (gastroesophageal reflux disease) 2013  . Hyperlipidemia 1986  . Hypertension 2008  . Hypothyroidism 1988   Diagnosed in her 77s.  Managed by Endocrinologist  . Lumbar radiculopathy 04/2019   Dr. Tamala Julian to get plain films of LB and hip (considering MRI due to her hx of cancer)  . Osteoporosis 2015   pt states "osteopenia", but then says that she refused to take the rx med for this condition, so I suspect she had osteoporosis.  . Peripheral neuropathy 2017   Patient states diabetic neuropathy in feet prior to starting chemotherapy and then worsened by chemo.   [name] Kitchen  TIA (transient ischemic attack) 03/26/2011    Past Surgical History:  Procedure Laterality Date  . ABDOMINAL HYSTERECTOMY  1972  . APPENDECTOMY  1972  . BREAST ENHANCEMENT SURGERY  1982  . BREAST IMPLANT REMOVAL Right 09/13/2016   Procedure: REMOVAL RIGHT BREAST IMPLANT;  Surgeon: Irene Limbo, MD;  Location: [facility];  Service: Plastics;  Laterality: Right;  . BREAST LUMPECTOMY WITH RADIOACTIVE SEED AND SENTINEL LYMPH NODE BIOPSY Right 09/13/2016   Procedure: RIGHT BREAST LUMPECTOMY WITH RADIOACTIVE SEED X 2 AND SENTINEL LYMPH NODE BIOPSY;  Surgeon: Alphonsa Overall, MD;  Location: [facility];  Service: General;  Laterality: Right;  . BREAST SURGERY Right 03/14/2016   Biopsy  . CAPSULECTOMY Right 09/13/2016   Procedure: RIGHT CAPSULECTOMY;  Surgeon: Irene Limbo, MD;  Location: [facility];  Service: Plastics;  Laterality: Right;  . CATARACT EXTRACTION, BILATERAL Bilateral 08/10/17 right eye, 08/31/17 left eye  . MASS EXCISION Left 02/28/2018   Path: benign.  Procedure: EXCISIONLEFT MEDIAL THIGH MASS ERAS PATHWAY;  Surgeon: Erroll Luna, MD;  Location: [facility];  Service: General;  Laterality: Left;  . PORTACATH PLACEMENT N/A 03/15/2016   Procedure: INSERTION PORT-A-CATH WITH [place];  Surgeon: Alphonsa Overall, MD;  Location: [facility];  Service: General;  Laterality: N/A;  . PORTACATH REMOVAL  07/2017  . surgical repair left ankle Left 2009   s/p Fall   . TONSILLECTOMY AND ADENOIDECTOMY  1948   Age 65    There were no vitals filed for this visit.  PT Short Term Goals - 07/08/19 1107      PT SHORT TERM GOAL #1   Title  Pt will be I with HEP for hip and back flexibilty, strength    Baseline  met with initial HEP, will update prn    Time  4    Period  Weeks    Status  Achieved      PT SHORT TERM GOAL #2   Title  Patient will demonstrate full pain free right hip AROM     Baseline  continues to have pain with end ranges     Time  4    Period  Weeks    Status  On-going        PT Long Term Goals - 07/08/19 1047      PT LONG TERM GOAL #1   Title  Patient will sleep through the night without increased pain , about 50% of the time    Baseline  met    Time  8    Period  Weeks    Status  Achieved      PT LONG TERM GOAL #2   Title  Pt will be able to walk through [facility] for 45 min with pain in back, hip no more than 3/10.    Baseline  met    Time  8    Period  Weeks    Status  Achieved      PT LONG TERM GOAL #3   Title  Patieatient witll demonstrate a 42% limitation on FOTO    Baseline  43% limited    Time  8    Period  Weeks    Status  On-going      PT LONG TERM GOAL #4   Title  Pt will become more aware of posture, lifting as she completes ADLs, work tasks (sitting less) and walks    Baseline  improving but ongoing    Time  8    Period  Weeks    Status  On-going      PT LONG TERM GOAL #5   Baseline  ongoing              Patient will benefit from skilled therapeutic intervention in order to improve the following deficits and impairments:  Increased fascial restricitons, Pain, Postural dysfunction, Decreased mobility, Decreased endurance, Decreased range of motion, Decreased strength, Impaired flexibility, Difficulty walking, Decreased balance  Visit Diagnosis: Pain in right hip  Other abnormalities of gait and mobility  Muscle weakness (generalized)  Abnormal posture  Sacroiliac joint pain     Problem List Patient Active Problem List   Diagnosis Date Noted  . Piriformis syndrome of right side 05/16/2019  . Lower extremity pain, right 04/23/2019  . DVT (deep venous thrombosis) ([facility]) 04/23/2019  . Lumbar radiculopathy, right 04/02/2019  . Other long term (current) drug therapy 06/14/2017  . Anxiety and depression   . History of breast cancer 03/02/2017  . History of therapeutic radiation 03/02/2017  . Breast pain,  right 10/03/2016  . Type 2 diabetes mellitus ([facility]) 08/23/2016  . Hypertension associated with diabetes ([facility]) 08/23/2016  . Hypothyroidism 08/23/2016  . Port catheter in place 05/09/2016  . Peripheral neuropathy 05/09/2016  . Breast cancer of upper-inner quadrant of right female breast ([facility]) 03/09/2016  . Major depressive disorder, recurrent ([facility]) 02/27/2014  . Uncomplicated alcohol dependence ([facility]) 02/19/2014         PHYSICAL THERAPY DISCHARGE SUMMARY  Visits from Start of Care: 12  Current functional level related to goals / functional outcomes: Patient last seen for therapy 07/17/19-at the time plan of care was left open in case of need for return while she tried continuing via HEP-no further PT visits scheduled/planned at this time. Patient to follow up with MD with any future changes in status.   Remaining deficits: Hip weakness   Education / Equipment: HEP, POC Plan: Patient agrees to discharge.  Patient goals were partially met. Patient is being discharged due to meeting the stated rehab goals.  ?????           Beaulah Dinning, PT, DPT 08/19/19 11:42 AM       [facility] [address] [place], [place], [address] Phone: [phone]   Fax:  [phone]  Name: Khilee Hendricksen MRN: [id number] Date of Birth: Sep 22, 1940

## 2019-08-21 DIAGNOSIS — F332 Major depressive disorder, recurrent severe without psychotic features: Secondary | ICD-10-CM | POA: Diagnosis not present

## 2019-09-02 ENCOUNTER — Other Ambulatory Visit: Payer: Self-pay

## 2019-09-02 ENCOUNTER — Telehealth: Payer: Self-pay | Admitting: Hematology and Oncology

## 2019-09-02 ENCOUNTER — Telehealth: Payer: Self-pay | Admitting: *Deleted

## 2019-09-02 NOTE — Telephone Encounter (Signed)
Patient reports a "gush of blood" from her vagina during sexual intercourse today.  She is taking Xarelto for a DVT since last November and has had "a lot of bruising" on her arms but states this is the first time she has had any vaginal bleeding.  The bleeding stopped within a few minutes. She says she thinks Dr. Lindi Adie told her she would only have to take Xarelto for 5 months and asks if she can stop taking it yet. Dr. Lindi Adie is out of the office this week. Above symptoms reported to Dr. Jana Hakim and he instructed for patient to hold Xarelto x 1 day only, call her Gynecologist and report this incident, then follow up with Dr. Lindi Adie next week when he returns to office.  Spoke with collaborative nurse, Marlon Pel, and she will request appointment for patient to see Dr. Lindi Adie.  Informed patient of Dr. Virgie Dad instructions. She takes Xarelto in the evening and will hold it tonight. She will call her gynecologist to report the bleeding and wait to hear about appointment with Dr. Lindi Adie. Informed patient she is scheduled to see him in May for a study required visit and we will still need to keep this appointment even if she sees him next week regarding the bleeding and Xarelto. She verbalized understanding.  Instructed her to call back if any further bleeding. Patient verbalized understanding.  Foye Spurling, BSN, RN Clinical Research Nurse 09/02/2019 2:15 PM

## 2019-09-02 NOTE — Telephone Encounter (Signed)
Scheduled apt per 3/29 sch message - pt aware of appt date and time

## 2019-09-04 ENCOUNTER — Other Ambulatory Visit: Payer: Self-pay

## 2019-09-04 ENCOUNTER — Ambulatory Visit (INDEPENDENT_AMBULATORY_CARE_PROVIDER_SITE_OTHER): Payer: Medicare Other | Admitting: Obstetrics & Gynecology

## 2019-09-04 ENCOUNTER — Encounter: Payer: Self-pay | Admitting: Obstetrics & Gynecology

## 2019-09-04 VITALS — BP 130/78

## 2019-09-04 DIAGNOSIS — Z853 Personal history of malignant neoplasm of breast: Secondary | ICD-10-CM | POA: Diagnosis not present

## 2019-09-04 DIAGNOSIS — N952 Postmenopausal atrophic vaginitis: Secondary | ICD-10-CM | POA: Diagnosis not present

## 2019-09-04 DIAGNOSIS — S3141XA Laceration without foreign body of vagina and vulva, initial encounter: Secondary | ICD-10-CM

## 2019-09-04 DIAGNOSIS — C50211 Malignant neoplasm of upper-inner quadrant of right female breast: Secondary | ICD-10-CM

## 2019-09-04 DIAGNOSIS — Z171 Estrogen receptor negative status [ER-]: Secondary | ICD-10-CM

## 2019-09-04 NOTE — Patient Instructions (Signed)
1. Laceration of vulva, initial encounter Rt anterior vulvar laceration associated with penetration.  Laceration healing well.  No sign of infection, no bleeding, closed.  Context of Postmenopausal atrophic vaginitis.  H/O Breast Ca, but ER Negative.  Decision to improve her skin quality with an Estrogen cream.  Compound Estrogen cream usage, risks and benefits reviewed.  Prescription sent to [facility].  Strongly recommended to restart with plenty of her lubricant on him and her at the entrance of the vagina.  Abstain from IC x 2 weeks to let the laceration heal well and give time to the Estrogen Cream to improve the quality of her skin.    2. Postmenopausal atrophic vaginitis Starting Compound Estrogen cream.  3. Malignant neoplasm of upper-inner quadrant of right breast in female, estrogen receptor negative (HCC) Breast Ca ER Negative.  Marissa Hall, it was a pleasure seeing you today!

## 2019-09-04 NOTE — Progress Notes (Signed)
    Marissa Hall Apr 22, 1941 [id number]        79 y.o.  G1P1L1  RP: Post-coital bleeding x 1  HPI: Postmenopause on no HRT.  S/P Total Hysterectomy.  Breast Ca in 2017, ER Negative.  Having sex once a week. Usually uses a lubricant, tried with coconut oil this last time on Sunday 3/28th when she had a large amount of red bleeding during and right after penetration.  Her 82 yo boyfriend didn't have any penile lesion or blood in urine afterward.  She stopped bleeding rapidly after.  No vaginal bleeding x 2 days now.  No vaginal or vulvar pain.     OB History  Gravida Para Term Preterm AB Living  1 1       1   SAB TAB Ectopic Multiple Live Births               # Outcome Date GA Lbr Len/2nd Weight Sex Delivery Anes PTL Lv  1 Para             Past medical history,surgical history, problem list, medications, allergies, family history and social history were all reviewed and documented in the EPIC chart.   Directed ROS with pertinent positives and negatives documented in the history of present illness/assessment and plan.  Exam:  Vitals:   09/04/19 1406  BP: 130/78   General appearance:  Normal   Gynecologic exam: Physical Exam Genitourinary:       Assessment/Plan:  79 y.o. G1P1   1. Laceration of vulva, initial encounter Rt anterior vulvar laceration associated with penetration.  Laceration healing well.  No sign of infection, no bleeding, closed.  Context of Postmenopausal atrophic vaginitis.  H/O Breast Ca, but ER Negative.  Decision to improve her skin quality with an Estrogen cream.  Compound Estrogen cream usage, risks and benefits reviewed.  Prescription sent to [facility].  Strongly recommended to restart with plenty of her lubricant on him and her at the entrance of the vagina.  Abstain from IC x 2 weeks to let the laceration heal well and give time to the Estrogen Cream to improve the quality of her skin.    2. Postmenopausal atrophic vaginitis Starting Compound  Estrogen cream.  3. Malignant neoplasm of upper-inner quadrant of right breast in female, estrogen receptor negative (HCC) Breast Ca ER Negative.  Princess Bruins MD, 2:28 PM 09/04/2019

## 2019-09-05 ENCOUNTER — Telehealth: Payer: Self-pay | Admitting: *Deleted

## 2019-09-05 MED ORDER — NONFORMULARY OR COMPOUNDED ITEM: 3 refills | Status: DC

## 2019-09-05 NOTE — Telephone Encounter (Signed)
-----   Message from Princess Bruins, MD sent at 09/04/2019  2:32 PM EDT ----- Regarding: Prescription of Compound Estrogen Cream to [facility] Compound Estrogen cream.  1/4 of an applicator vaginally and a thin layer on the vulva daily x 2 weeks.  Then 1/4 of an applicator vaginally and a thin layer on the vulva twice a week for long term.  Prescribe for 3 months, refill x 4.

## 2019-09-05 NOTE — Telephone Encounter (Signed)
Rx called in 

## 2019-09-09 ENCOUNTER — Other Ambulatory Visit: Payer: Self-pay | Admitting: *Deleted

## 2019-09-09 ENCOUNTER — Telehealth: Payer: Self-pay | Admitting: *Deleted

## 2019-09-09 DIAGNOSIS — C50211 Malignant neoplasm of upper-inner quadrant of right female breast: Secondary | ICD-10-CM

## 2019-09-09 NOTE — Telephone Encounter (Signed)
Per MD okay for pt to stop taking Xarelto.  Pt notified and verbalized understanding.

## 2019-09-10 ENCOUNTER — Ambulatory Visit: Payer: Medicare Other | Admitting: Hematology and Oncology

## 2019-09-16 DIAGNOSIS — E1165 Type 2 diabetes mellitus with hyperglycemia: Secondary | ICD-10-CM | POA: Diagnosis not present

## 2019-09-16 DIAGNOSIS — E1140 Type 2 diabetes mellitus with diabetic neuropathy, unspecified: Secondary | ICD-10-CM | POA: Diagnosis not present

## 2019-09-16 DIAGNOSIS — E039 Hypothyroidism, unspecified: Secondary | ICD-10-CM | POA: Diagnosis not present

## 2019-09-16 DIAGNOSIS — E785 Hyperlipidemia, unspecified: Secondary | ICD-10-CM | POA: Diagnosis not present

## 2019-09-16 DIAGNOSIS — E1169 Type 2 diabetes mellitus with other specified complication: Secondary | ICD-10-CM | POA: Diagnosis not present

## 2019-09-20 DIAGNOSIS — E1169 Type 2 diabetes mellitus with other specified complication: Secondary | ICD-10-CM | POA: Diagnosis not present

## 2019-09-20 DIAGNOSIS — E1140 Type 2 diabetes mellitus with diabetic neuropathy, unspecified: Secondary | ICD-10-CM | POA: Diagnosis not present

## 2019-09-20 DIAGNOSIS — E1165 Type 2 diabetes mellitus with hyperglycemia: Secondary | ICD-10-CM | POA: Diagnosis not present

## 2019-09-20 DIAGNOSIS — E039 Hypothyroidism, unspecified: Secondary | ICD-10-CM | POA: Diagnosis not present

## 2019-09-20 DIAGNOSIS — E785 Hyperlipidemia, unspecified: Secondary | ICD-10-CM | POA: Diagnosis not present

## 2019-09-20 LAB — TSH: TSH: 0.5 (ref 0.41–5.90)

## 2019-09-20 LAB — BASIC METABOLIC PANEL WITH GFR: Glucose: 181

## 2019-09-20 LAB — LIPID PANEL
Cholesterol: 185 (ref 0–200)
LDL Cholesterol: 98

## 2019-09-20 LAB — HEMOGLOBIN A1C: Hemoglobin A1C: 7.2

## 2019-09-26 ENCOUNTER — Encounter: Payer: Self-pay | Admitting: Family Medicine

## 2019-10-08 ENCOUNTER — Other Ambulatory Visit: Payer: Self-pay

## 2019-10-11 ENCOUNTER — Ambulatory Visit: Payer: Medicare Other | Admitting: Family Medicine

## 2019-10-11 NOTE — Progress Notes (Deleted)
OFFICE VISIT  10/11/2019   CC: No chief complaint on file.    HPI:    Patient is a 79 y.o. Caucasian female who presents for gastrointestinal complaints.  Past Medical History:  Diagnosis Date  . Acute DVT (deep venous thrombosis) ([facility]) 04/23/2019   Right popliteal  . Anxiety and depression 1964   oncologist started duloxetine 12/2016  . Breast cancer ([place]) 03/14/2016   Clinical stage 2A: (triple neg): Right breast, upper inner quadrant, 03/2016.  Neoadjuvant chemo x 5 cycles,lumpectomy 4 mo later, then RT started 10/2016.  Adjuvant Xeloda [id number].  SWOG research trial pt 04/2017--pt randomized to pembrolizumab immunotherapy.  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  [name] Kitchen Cataracts, bilateral 07/2017  . 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 Depression 1964   Patient states since age 65  . Diabetes mellitus with complication ([place]) [id number]   managed by endocrinology.  A1c Mar 12, 2018 was 7.0% at Dr. Shirlyn Goltz.   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  Excision done 02/2019  . GERD (gastroesophageal reflux disease) 2013  . Hyperlipidemia 1986  . Hypertension 2008  . Hypothyroidism 1988   Diagnosed in her 78s.  Managed by Endocrinologist  . Lumbar radiculopathy 04/2019   Dr. Tamala Julian to get plain films of LB and hip (considering MRI due to her hx of cancer)  . Osteoporosis 2015   pt states "osteopenia", but then says that she refused to take the rx med for this condition, so I suspect she had osteoporosis.  . Peripheral neuropathy 2017   Patient states diabetic neuropathy in feet prior to starting chemotherapy and then worsened by chemo.   [name] Kitchen TIA (transient ischemic attack) 03/26/2011    Past Surgical History:  Procedure Laterality Date  . ABDOMINAL HYSTERECTOMY  1972  . APPENDECTOMY  1972  . BREAST ENHANCEMENT SURGERY   1982  . BREAST IMPLANT REMOVAL Right 09/13/2016   Procedure: REMOVAL RIGHT BREAST IMPLANT;  Surgeon: Irene Limbo, MD;  Location: [facility];  Service: Plastics;  Laterality: Right;  . BREAST LUMPECTOMY WITH RADIOACTIVE SEED AND SENTINEL LYMPH NODE BIOPSY Right 09/13/2016   Procedure: RIGHT BREAST LUMPECTOMY WITH RADIOACTIVE SEED X 2 AND SENTINEL LYMPH NODE BIOPSY;  Surgeon: Alphonsa Overall, MD;  Location: [facility];  Service: General;  Laterality: Right;  . BREAST SURGERY Right 03/14/2016   Biopsy  . CAPSULECTOMY Right 09/13/2016   Procedure: RIGHT CAPSULECTOMY;  Surgeon: Irene Limbo, MD;  Location: [facility];  Service: Plastics;  Laterality: Right;  . CATARACT EXTRACTION, BILATERAL Bilateral 08/10/17 right eye, 08/31/17 left eye  . MASS EXCISION Left 02/28/2018   Path: benign.  Procedure: EXCISIONLEFT MEDIAL THIGH MASS ERAS PATHWAY;  Surgeon: Erroll Luna, MD;  Location: [facility];  Service: General;  Laterality: Left;  . PORTACATH PLACEMENT N/A 03/15/2016   Procedure: INSERTION PORT-A-CATH WITH [place];  Surgeon: Alphonsa Overall, MD;  [facility];  Service: General;  Laterality: N/A;  . PORTACATH REMOVAL  07/2017  . surgical repair left ankle Left 2009   s/p Fall   . TONSILLECTOMY AND ADENOIDECTOMY  1948   Age 4    Outpatient Medications Prior to Visit  Medication Sig Dispense Refill  . atorvastatin (LIPITOR) 80 MG tablet Take 80 mg by mouth every evening.     . Cholecalciferol (VITAMIN  D3) 125 MCG (5000 UT) TABS Take by mouth.    . gabapentin (NEURONTIN) 300 MG capsule TAKE ONE CAPSULE BY MOUTH EVERY MORNING AND AT NOON AND TAKE 2 CAPSULES BY MOUTH EVERY EVENING    . gabapentin (NEURONTIN) 400 MG capsule Take 1 capsule (400 mg total) by mouth at bedtime. 30 capsule 0  . levothyroxine (SYNTHROID) 112 MCG tablet Take by mouth.    [name] Kitchen lisinopril (ZESTRIL) 30 MG tablet Take 1 tablet (30 mg total) by mouth daily. 90 tablet 1  .  Menthol, Topical Analgesic, (BIOFREEZE EX) Apply topically as needed.    . metFORMIN (GLUCOPHAGE-XR) 500 MG 24 hr tablet Take 1,000 mg by mouth daily before supper.     . NONFORMULARY OR COMPOUNDED ITEM Estradiol vaginal cream 0.02% insert 1/4 of an applicator vaginally and thin layer vulva daily x 2 weeks.  Then 1/4 of an applicator vaginally and a thin layer on the vulva twice a week for long term 90 each 3  . XARELTO 20 MG TABS tablet TAKE 1 TABLET (20 MG TOTAL) BY MOUTH DAILY WITH SUPPER. 30 tablet 1  . zaleplon (SONATA) 10 MG capsule Take 10 mg by mouth at bedtime as needed.     No facility-administered medications prior to visit.    Allergies  Allergen Reactions  . Other Other (See Comments)    STEROIDS- emotional  . Penicillins Other (See Comments)    Unsure of reaction, was 79 years old    ROS As per HPI  PE: There were no vitals taken for this visit. ***  LABS:    Chemistry      Component Value Date/Time   NA 139 04/24/2019 1129   NA 136 05/01/2017 0914   K 4.8 04/24/2019 1129   K 4.5 05/01/2017 0914   CL 101 04/24/2019 1129   CO2 25 04/24/2019 1129   CO2 24 05/01/2017 0914   BUN 20 04/24/2019 1129   BUN 14.9 05/01/2017 0914   CREATININE 0.90 04/24/2019 1129   CREATININE 0.8 05/01/2017 0914   GLU 181 09/20/2019 0000      Component Value Date/Time   CALCIUM 9.7 04/24/2019 1129   CALCIUM 9.6 05/01/2017 0914   ALKPHOS 62 04/24/2019 1129   ALKPHOS 75 05/01/2017 0914   AST 35 04/24/2019 1129   AST 51 (H) 05/01/2017 0914   ALT 40 04/24/2019 1129   ALT 62 (H) 05/01/2017 0914   BILITOT 1.2 04/24/2019 1129   BILITOT 1.30 (H) 05/01/2017 0914     Lab Results  Component Value Date   WBC 4.6 04/24/2019   HGB 14.0 04/24/2019   HCT 42.3 04/24/2019   MCV 98.1 04/24/2019   PLT 148 (L) 04/24/2019   Lab Results  Component Value Date   HGBA1C 7.2 09/20/2019   Lab Results  Component Value Date   CHOL 185 09/20/2019   [place] 98 09/20/2019    IMPRESSION AND  PLAN:  No problem-specific Assessment & Plan notes found for this encounter.   An After Visit Summary was printed and given to the patient.  FOLLOW UP: No follow-ups on file.  Signed:  Crissie Sickles, MD           10/11/2019

## 2019-10-16 ENCOUNTER — Other Ambulatory Visit: Payer: Self-pay

## 2019-10-16 ENCOUNTER — Encounter: Payer: Self-pay | Admitting: Family Medicine

## 2019-10-16 ENCOUNTER — Telehealth (INDEPENDENT_AMBULATORY_CARE_PROVIDER_SITE_OTHER): Payer: Medicare Other | Admitting: Family Medicine

## 2019-10-16 DIAGNOSIS — R197 Diarrhea, unspecified: Secondary | ICD-10-CM

## 2019-10-16 NOTE — Progress Notes (Signed)
Virtual Visit via Video Note  I connected with pt on 10/16/19 at  2:00 PM EDT by a video enabled telemedicine application and verified that I am speaking with the correct person using two identifiers.  Location patient: home Location provider:work or home office Persons participating in the virtual visit: patient, provider  I discussed the limitations of evaluation and management by telemedicine and the availability of in person appointments. The patient expressed understanding and agreed to proceed.  Telemedicine visit is a necessity given the COVID-19 restrictions in place at the current time.  HPI: 79 y/o WF being seen today for diarrhea.  Onset 4-5 mo ago or so. Stools liquidy, sometimes looks like something like a carbonzo bean shaped globule of yellow substance. Intermittent, sometimes frequent, sometimes not, feels lack of control of bm's at times. Goes several days at a time w/out prob (normal brown, soft stools, then sometimes "just happens".  She is apprehensive b/c doesn't know when it is going to happen.  No clear association with any of her medications.  She wonders about drinking lots of water with citrus--if this could be causing the problem.  Has occ discomfort under rib cage, some mild cramping in lower abd sometimes but other times none.  Sometimes takes omeprazole but diarrhea not clearly assoc with this.  No fevers, no blood in stool, no malaise, no n/v.  No signif constipation problems.  Bladder function intact. Lots of stress lately but this problem was going on before this.  09/20/19 endo f/u for DM and hypoth: metformin increased and levoxyl decreased from 112 to 100 mcg qd. Pt thinks the problem has actually improved a bit since increasing metformin.  [facility] for hx of alcohol issues and she says also for following of her gabapentin but they asked her to do UDS and she was insulted and refused so they d/c'd her. She will be requesting  her gabapentin through me from here on out.  ROS: See pertinent positives and negatives per HPI.  Past Medical History:  Diagnosis Date  . Acute DVT (deep venous thrombosis) ([place]) 04/23/2019   Right popliteal  . Anxiety and depression 1964   oncologist started duloxetine 12/2016  . Breast cancer ([place]) 03/14/2016   Clinical stage 2A: (triple neg): Right breast, upper inner quadrant, 03/2016.  Neoadjuvant chemo x 5 cycles,lumpectomy 4 mo later, then RT started 10/2016.  Adjuvant Xeloda [id number].  SWOG research trial pt 04/2017--pt randomized to pembrolizumab immunotherapy.  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  [name] Kitchen Cataracts, bilateral 07/2017  . 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 Depression 1964   Patient states since age 67  . Diabetes mellitus with complication ([place]) [id number]   managed by endocrinology.  A1c Mar 12, 2018 was 7.0% at Dr. Shirlyn Goltz.   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  Excision done 02/2019  . GERD (gastroesophageal reflux disease) 2013  . Hyperlipidemia 1986  . Hypertension 2008  . Hypothyroidism 1988   Diagnosed in her 98s.  Managed by Endocrinologist  . Lumbar radiculopathy 04/2019   Dr. Tamala Julian to get plain films of LB and hip (considering MRI due to her hx of cancer)  . Osteoporosis 2015   pt states "osteopenia", but then says that she refused to take the rx med for this condition, so I suspect  she had osteoporosis.  . Peripheral neuropathy 2017   Patient states diabetic neuropathy in feet prior to starting chemotherapy and then worsened by chemo.   [name] Kitchen TIA (transient ischemic attack) 03/26/2011    Past Surgical History:  Procedure Laterality Date  . ABDOMINAL HYSTERECTOMY  1972  . APPENDECTOMY  1972  . BREAST ENHANCEMENT SURGERY  1982  . BREAST IMPLANT REMOVAL Right 09/13/2016   Procedure:  REMOVAL RIGHT BREAST IMPLANT;  Surgeon: Irene Limbo, MD;  Location: [facility];  Service: Plastics;  Laterality: Right;  . BREAST LUMPECTOMY WITH RADIOACTIVE SEED AND SENTINEL LYMPH NODE BIOPSY Right 09/13/2016   Procedure: RIGHT BREAST LUMPECTOMY WITH RADIOACTIVE SEED X 2 AND SENTINEL LYMPH NODE BIOPSY;  Surgeon: Alphonsa Overall, MD;  Location: [facility];  Service: General;  Laterality: Right;  . BREAST SURGERY Right 03/14/2016   Biopsy  . CAPSULECTOMY Right 09/13/2016   Procedure: RIGHT CAPSULECTOMY;  Surgeon: Irene Limbo, MD;  Location: [facility];  Service: Plastics;  Laterality: Right;  . CATARACT EXTRACTION, BILATERAL Bilateral 08/10/17 right eye, 08/31/17 left eye  . MASS EXCISION Left 02/28/2018   Path: benign.  Procedure: EXCISIONLEFT MEDIAL THIGH MASS ERAS PATHWAY;  Surgeon: Erroll Luna, MD;  Location: [facility];  Service: General;  Laterality: Left;  . PORTACATH PLACEMENT N/A 03/15/2016   Procedure: INSERTION PORT-A-CATH WITH [place];  Surgeon: Alphonsa Overall, MD;  Location: [facility];  Service: General;  Laterality: N/A;  . PORTACATH REMOVAL  07/2017  . surgical repair left ankle Left 2009   s/p Fall   . TONSILLECTOMY AND ADENOIDECTOMY  1948   Age 48    Family History  Problem Relation Age of Onset  . Stroke Mother   . Suicidality Father   . Stroke Brother   . Stroke Son     Social History   Socioeconomic History  . Marital status: Divorced    Spouse name: Not on file  . Number of children: Not on file  . Years of education: Not on file  . Highest education level: Not on file  Occupational History  . Not on file  Tobacco Use  . Smoking status: Former Smoker    Packs/day: 1.00    Types: Cigarettes    Quit date: 03/08/1999    Years since quitting: 20.6  . Smokeless tobacco: Never Used  Substance and Sexual Activity  . Alcohol use: Yes    Alcohol/week: 7.0 standard drinks    Types: 7 Shots of liquor per  week    Comment:  daily  . Drug use: No  . Sexual activity: Yes    Partners: Male    Comment: 1st intercourse- 16, partners- 10+, current partner- 8 yrs  Other Topics Concern  . Not on file  Social History Narrative   Divorced, has a son who lives in [place].   Educ: college   Occup: Optometrist, still working part time.   Tob: quit 2000.     Alc: several times a week--1-2 glasses wine.   No drugs.   Social Determinants of Health   Financial Resource Strain:   . Difficulty of Paying Living Expenses:   Food Insecurity:   . Worried About Charity fundraiser in the Last Year:   . Arboriculturist in the Last Year:   Transportation Needs:   . Film/video editor (Medical):   [name] Kitchen Lack of Transportation (Non-Medical):   Physical Activity:   . Days of Exercise per Week:   .  Minutes of Exercise per Session:   Stress:   . Feeling of Stress :   Social Connections:   . Frequency of Communication with Friends and Family:   . Frequency of Social Gatherings with Friends and Family:   . Attends Religious Services:   . Active Member of Clubs or Organizations:   . Attends Archivist Meetings:   [name] Kitchen Marital Status:       Current Outpatient Medications:  .  atorvastatin (LIPITOR) 80 MG tablet, Take 80 mg by mouth every evening. , Disp: , Rfl:  .  Cholecalciferol (VITAMIN D3) 125 MCG (5000 UT) TABS, Take by mouth., Disp: , Rfl:  .  gabapentin (NEURONTIN) 300 MG capsule, TAKE ONE CAPSULE BY MOUTH EVERY MORNING AND AT NOON AND TAKE 2 CAPSULES BY MOUTH EVERY EVENING, Disp: , Rfl:  .  gabapentin (NEURONTIN) 400 MG capsule, Take 1 capsule (400 mg total) by mouth at bedtime., Disp: 30 capsule, Rfl: 0 .  levothyroxine (SYNTHROID) 112 MCG tablet, Take by mouth., Disp: , Rfl:  .  lisinopril (ZESTRIL) 30 MG tablet, Take 1 tablet (30 mg total) by mouth daily., Disp: 90 tablet, Rfl: 1 .  Menthol, Topical Analgesic, (BIOFREEZE EX), Apply topically as needed., Disp: , Rfl:  .  metFORMIN  (GLUCOPHAGE-XR) 500 MG 24 hr tablet, Take 1,000 mg by mouth daily before supper. , Disp: , Rfl:  .  NONFORMULARY OR COMPOUNDED ITEM, Estradiol vaginal cream 0.02% insert 1/4 of an applicator vaginally and thin layer vulva daily x 2 weeks.  Then 1/4 of an applicator vaginally and a thin layer on the vulva twice a week for long term, Disp: 90 each, Rfl: 3 .  XARELTO 20 MG TABS tablet, TAKE 1 TABLET (20 MG TOTAL) BY MOUTH DAILY WITH SUPPER., Disp: 30 tablet, Rfl: 1 .  zaleplon (SONATA) 10 MG capsule, Take 10 mg by mouth at bedtime as needed., Disp: , Rfl:   EXAM:  VITALS per patient if applicable: There were no vitals taken for this visit.   GENERAL: alert, oriented, appears well and in no acute distress  HEENT: atraumatic, conjunttiva clear, no obvious abnormalities on inspection of external nose and ears  NECK: normal movements of the head and neck  LUNGS: on inspection no signs of respiratory distress, breathing rate appears normal, no obvious gross SOB, gasping or wheezing  CV: no obvious cyanosis  MS: moves all visible extremities without noticeable abnormality  PSYCH/NEURO: pleasant and cooperative, no obvious depression or anxiety, speech and thought processing grossly intact  LABS: none today    Chemistry      Component Value Date/Time   NA 139 04/24/2019 1129   NA 136 05/01/2017 0914   K 4.8 04/24/2019 1129   K 4.5 05/01/2017 0914   CL 101 04/24/2019 1129   CO2 25 04/24/2019 1129   CO2 24 05/01/2017 0914   BUN 20 04/24/2019 1129   BUN 14.9 05/01/2017 0914   CREATININE 0.90 04/24/2019 1129   CREATININE 0.8 05/01/2017 0914   GLU 181 09/20/2019 0000      Component Value Date/Time   CALCIUM 9.7 04/24/2019 1129   CALCIUM 9.6 05/01/2017 0914   ALKPHOS 62 04/24/2019 1129   ALKPHOS 75 05/01/2017 0914   AST 35 04/24/2019 1129   AST 51 (H) 05/01/2017 0914   ALT 40 04/24/2019 1129   ALT 62 (H) 05/01/2017 0914   BILITOT 1.2 04/24/2019 1129   BILITOT 1.30 (H) 05/01/2017  0914     Lab Results  Component  Value Date   WBC 4.6 04/24/2019   HGB 14.0 04/24/2019   HCT 42.3 04/24/2019   MCV 98.1 04/24/2019   PLT 148 (L) 04/24/2019   Lab Results  Component Value Date   TSH 0.50 09/20/2019   ASSESSMENT AND PLAN:  Discussed the following assessment and plan:  Intermittent diarrhea, sometimes no warning/no control. Not clearly related/secondary to any specific food or eating pattern or medication. 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 I recommended she start daily metamucil or benefiber supplement, keep diary of her daily food/drink intake and see if any pattern/connection with specific/general food. Monitor for new sx's or worsening/more persistent diarrhea. No GI referral or testing at this time but will consider in future if things change for the worse.  Spent 30 min with pt today, with >50% of this time spent in counseling and care coordination regarding the above problems.  -we discussed possible serious and likely etiologies, options for evaluation and workup, limitations of telemedicine visit vs in person visit, treatment, treatment risks and precautions. Pt prefers to treat via telemedicine empirically rather then risking or undertaking an in person visit at this moment. Patient agrees to seek prompt in person care if worsening, new symptoms arise, or if is not improving with treatment.   I discussed the assessment and treatment plan with the patient. The patient was provided an opportunity to ask questions and all were answered. The patient agreed with the plan and demonstrated an understanding of the instructions.   The patient was advised to call back or seek an in-person evaluation if the symptoms worsen or if the condition fails to improve as anticipated.  F/u: if not improving.  Signed:  Crissie Sickles, MD           10/16/2019

## 2019-10-21 NOTE — Progress Notes (Signed)
Patient Care Team: Tammi Sou, MD as PCP - General (Family Medicine) Alphonsa Overall, MD as Consulting Physician (General Surgery) Nicholas Lose, MD as Consulting Physician (Hematology and Oncology) Kyung Rudd, MD as Consulting Physician (Radiation Oncology) Irene Limbo, MD as Consulting Physician (Plastic Surgery) Gerome Apley, MD as Consulting Physician (Endocrinology) Gardenia Phlegm, NP as Nurse Practitioner (Hematology and Oncology) Princess Bruins, MD as Consulting Physician (Obstetrics and Gynecology)  DIAGNOSIS:    ICD-10-CM   1. Malignant neoplasm of upper-inner quadrant of right breast in female, estrogen receptor negative ([place])  C50.211    Z17.1     SUMMARY OF ONCOLOGIC HISTORY: Oncology History  Breast cancer of upper-inner quadrant of right female breast ([place])  02/29/2016 Initial Diagnosis   Right breast palpable mass (with silicone implants 7846), 3.5 cm on MRI, additional 3 cm anterior linear enhancement? 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 Ki-67 60%, T2 N0 stage 2A clinical stage   03/14/2016 Procedure   Right breast biopsy upper inner quadrant: IDC grade 3   03/28/2016 -  Neo-Adjuvant Chemotherapy   Neoadjuvant chemotherapy with dose dense Adriamycin and Cytoxan followed by Abraxane weekly 5 ( patient is diabetic and cannot take steroids)   07/15/2016 Breast MRI   Right breast: Spiculated mass 1.5 cm significantly smaller compared to prior,NME previously seen is not noted, no abnormal lymph nodes   09/13/2016 Surgery   Removal of the silicone implant due to intracapsular rupture and capsulectomy (Dr.Thimappa)   09/13/2016 Surgery   Right lumpectomy: IDC grade 3, 2 foci, 2 cm and 1.1 cm, 0/3 lymph nodes negative margins negative, ER 0%, PR 0%, HER-2 negative ratio 1.02, Ki-67 60%, RCB-II; ypT2ypN0 Stage 2A    10/20/2016 - 12/06/2016 Radiation Therapy   Adjuvant radiation therapy   12/30/2016 - 04/05/2017 Chemotherapy   Xeloda  1000 mg by mouth twice a day adjuvant therapy x 4 cycles    05/03/2017 - 05/24/2017 Chemotherapy   SWOG S 1418 Pembrolizumab on clinical trial stopped after 2 doses by patient preference (not due to toxicities .)     CHIEF COMPLIANT: Follow-up of right breast cancer and DVT on Xarelto   INTERVAL HISTORY: Marissa Hall is a 79 y.o. with above-mentioned history of right breast cancer treated with neoadjuvant chemotherapy, lumpectomy, radiation, and is currently on surveillance. 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 She presents to the clinic today for follow-up.   ALLERGIES:  is allergic to other and penicillins.  MEDICATIONS:  Current Outpatient Medications  Medication Sig Dispense Refill  . atorvastatin (LIPITOR) 80 MG tablet Take 80 mg by mouth every evening.     . Cholecalciferol (VITAMIN D3) 125 MCG (5000 UT) TABS Take by mouth.    . gabapentin (NEURONTIN) 300 MG capsule TAKE ONE CAPSULE BY MOUTH EVERY MORNING AND AT NOON AND TAKE 2 CAPSULES BY MOUTH EVERY EVENING    . levothyroxine (SYNTHROID) 112 MCG tablet Take by mouth.    [name] Kitchen lisinopril (ZESTRIL) 30 MG tablet Take 1 tablet (30 mg total) by mouth daily. 90 tablet 1  . Menthol, Topical Analgesic, (BIOFREEZE EX) Apply topically as needed.    . metFORMIN (GLUCOPHAGE-XR) 500 MG 24 hr tablet Take 1,000 mg by mouth daily before supper.     . NONFORMULARY OR COMPOUNDED ITEM Estradiol vaginal cream 0.02% insert 1/4 of an applicator vaginally and thin layer vulva daily x 2 weeks.  Then 1/4 of an applicator vaginally and a thin layer on the vulva twice a  week for long term 90 each 3  . zaleplon (SONATA) 10 MG capsule Take 10 mg by mouth at bedtime as needed.     No current facility-administered medications for this visit.    PHYSICAL EXAMINATION: ECOG PERFORMANCE STATUS: 1 - Symptomatic but completely ambulatory  Vitals:   10/22/19 1150  BP: (!) 155/79  Pulse: (!) 58  Resp: 18  Temp: 98.6 F (37 C)  SpO2: 100%    Filed Weights   10/22/19 1150  Weight: 172 lb 14.4 oz (78.4 kg)      LABORATORY DATA:  I have reviewed the data as listed CMP Latest Ref Rng & Units 04/24/2019 05/21/2018 02/22/2018  Glucose 70 - 99 mg/dL 197(H) 133(H) 153(H)  BUN 8 - 23 mg/dL '20 18 15  '$ Creatinine 0.44 - 1.00 mg/dL 0.90 0.92 0.79  Sodium 135 - 145 mmol/L 139 139 140  Potassium 3.5 - 5.1 mmol/L 4.8 4.7 4.6  Chloride 98 - 111 mmol/L 101 103 105  CO2 22 - 32 mmol/L '25 26 28  '$ Calcium 8.9 - 10.3 mg/dL 9.7 9.9 9.5  Total Protein 6.5 - 8.1 g/dL 7.9 7.8 7.2  Total Bilirubin 0.3 - 1.2 mg/dL 1.2 1.4(H) 1.0  Alkaline Phos 38 - 126 U/L 62 74 74  AST 15 - 41 U/L 35 38 28  ALT 0 - 44 U/L 40 45(H) 36    Lab Results  Component Value Date   WBC 4.3 10/22/2019   HGB 13.8 10/22/2019   HCT 40.7 10/22/2019   MCV 97.8 10/22/2019   PLT 137 (L) 10/22/2019   NEUTROABS 2.7 10/22/2019    ASSESSMENT & PLAN:  Breast cancer of upper-inner quadrant of right female breast ([place]) Peroneal vein DVT: Unable to start on Xarelto because of insurance issues. We were able to obtain a first month voucher for Xarelto to be provided at the [facility]. She will hear from her insurance company tomorrow. After the first 3 weeks, she can go to 20 mg daily.  Breast cancer of upper-inner quadrant of right female breast ([place]) 02/29/2016: Right breast palpable mass (with silicone implants 9678), 3.5 cm on MRI, additional 3 cm anterior linear enhancement (biopsy 03/14/2016 IDC grade 3); 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  T2 N0 stage 2A clinical stage  Treatment summary: 1. Neoadjuvant chemotherapy with dose dense Adriamycin and Cytoxan 4 followed by Abraxane weekly 5(stopped early due to neuropathy) 2. 09/13/2016: Right lumpectomy: IDC grade 3, 2 foci, 2 cm and 1.1 cm, 0/3 lymph nodes negative margins negative, ER 0%, PR 0%, HER-2 negative ratio 1.02, Ki-67 60%, RCB-II; ypT2ypN0 Stage 2A (with plastic surgery removing the  ruptured implant) 3. Followed by radiation therapy completed 12/06/2016 4.Adjuvant Xeloda 1000 mg by mouth twice a day 2 weeks on one week off 12/30/2016-Oct 2018 5.SWOG 1418 clinical trial Pembrolizumabcycle 1 given 05/03/2017(patient decided to discontinue clinical trial) ----------------------------------------------------------------------------------------------------------------------------------------- Surveillance: 1.Breast exam 04/24/2019: Benign 2.mammogram11/3/2020at [facility]: No evidence of malignancy. Breast density categoryB  Chronic fatigue Chemo-induced peripheral neuropathy: Monitoring Return to clinic in 6 months with labs and follow-up    No orders of the defined types were placed in this encounter.  The patient has a good understanding of the overall plan. she agrees with it. she will call with any problems that may develop before the next visit here.  Total time spent: 20 mins including face to face time and time spent for planning, charting and coordination of care  Nicholas Lose, MD 10/22/2019  I, Molly Dorshimer, am acting  as scribe for Dr. Nicholas Lose.  I have reviewed the above documentation for accuracy and completeness, and I agree with the above.

## 2019-10-22 ENCOUNTER — Inpatient Hospital Stay (HOSPITAL_BASED_OUTPATIENT_CLINIC_OR_DEPARTMENT_OTHER): Payer: Medicare Other | Admitting: Hematology and Oncology

## 2019-10-22 ENCOUNTER — Inpatient Hospital Stay: Payer: Medicare Other | Attending: Hematology and Oncology

## 2019-10-22 ENCOUNTER — Encounter: Payer: Self-pay | Admitting: *Deleted

## 2019-10-22 ENCOUNTER — Other Ambulatory Visit: Payer: Self-pay

## 2019-10-22 DIAGNOSIS — Z9221 Personal history of antineoplastic chemotherapy: Secondary | ICD-10-CM

## 2019-10-22 DIAGNOSIS — G620 Drug-induced polyneuropathy: Secondary | ICD-10-CM | POA: Insufficient documentation

## 2019-10-22 DIAGNOSIS — Z923 Personal history of irradiation: Secondary | ICD-10-CM | POA: Insufficient documentation

## 2019-10-22 DIAGNOSIS — Z006 Encounter for examination for normal comparison and control in clinical research program: Secondary | ICD-10-CM

## 2019-10-22 DIAGNOSIS — C50211 Malignant neoplasm of upper-inner quadrant of right female breast: Secondary | ICD-10-CM | POA: Diagnosis not present

## 2019-10-22 DIAGNOSIS — Z86718 Personal history of other venous thrombosis and embolism: Secondary | ICD-10-CM | POA: Diagnosis not present

## 2019-10-22 DIAGNOSIS — Z171 Estrogen receptor negative status [ER-]: Secondary | ICD-10-CM | POA: Insufficient documentation

## 2019-10-22 DIAGNOSIS — T451X5A Adverse effect of antineoplastic and immunosuppressive drugs, initial encounter: Secondary | ICD-10-CM | POA: Insufficient documentation

## 2019-10-22 DIAGNOSIS — Z7901 Long term (current) use of anticoagulants: Secondary | ICD-10-CM | POA: Insufficient documentation

## 2019-10-22 DIAGNOSIS — R5382 Chronic fatigue, unspecified: Secondary | ICD-10-CM | POA: Diagnosis not present

## 2019-10-22 LAB — CMP (CANCER CENTER ONLY)
ALT: 34 U/L (ref 0–44)
AST: 27 U/L (ref 15–41)
Albumin: 4 g/dL (ref 3.5–5.0)
Alkaline Phosphatase: 65 U/L (ref 38–126)
Anion gap: 13 (ref 5–15)
BUN: 14 mg/dL (ref 8–23)
CO2: 25 mmol/L (ref 22–32)
Calcium: 9.8 mg/dL (ref 8.9–10.3)
Chloride: 102 mmol/L (ref 98–111)
Creatinine: 1.01 mg/dL — ABNORMAL HIGH (ref 0.44–1.00)
GFR, Est AFR Am: >60 mL/min (ref >60)
GFR, Estimated: 54 mL/min — ABNORMAL LOW (ref >60)
Glucose, Bld: 194 mg/dL — ABNORMAL HIGH (ref 70–99)
Potassium: 4.3 mmol/L (ref 3.5–5.1)
Sodium: 140 mmol/L (ref 135–145)
Total Bilirubin: 1.6 mg/dL — ABNORMAL HIGH (ref 0.3–1.2)
Total Protein: 7.2 g/dL (ref 6.5–8.1)

## 2019-10-22 LAB — CBC WITH DIFFERENTIAL (CANCER CENTER ONLY)
Abs Immature Granulocytes: 0.01 10*3/uL (ref 0.00–0.07)
Basophils Absolute: 0 10*3/uL (ref 0.0–0.1)
Basophils Relative: 1 %
Eosinophils Absolute: 0.1 10*3/uL (ref 0.0–0.5)
Eosinophils Relative: 3 %
HCT: 40.7 % (ref 36.0–46.0)
Hemoglobin: 13.8 g/dL (ref 12.0–15.0)
Immature Granulocytes: 0 %
Lymphocytes Relative: 25 %
Lymphs Abs: 1.1 10*3/uL (ref 0.7–4.0)
MCH: 33.2 pg (ref 26.0–34.0)
MCHC: 33.9 g/dL (ref 30.0–36.0)
MCV: 97.8 fL (ref 80.0–100.0)
Monocytes Absolute: 0.4 10*3/uL (ref 0.1–1.0)
Monocytes Relative: 9 %
Neutro Abs: 2.7 10*3/uL (ref 1.7–7.7)
Neutrophils Relative %: 62 %
Platelet Count: 137 10*3/uL — ABNORMAL LOW (ref 150–400)
RBC: 4.16 MIL/uL (ref 3.87–5.11)
RDW: 12.5 % (ref 11.5–15.5)
WBC Count: 4.3 10*3/uL (ref 4.0–10.5)
nRBC: 0 % (ref 0.0–0.2)

## 2019-10-22 NOTE — Assessment & Plan Note (Signed)
Peroneal vein DVT: Unable to start on Xarelto because of insurance issues. We were able to obtain a first month voucher for Xarelto to be provided at the [facility] [employer]. She will hear from her insurance company tomorrow. After the first 3 weeks, she can go to 20 mg daily.  Breast cancer of upper-inner quadrant of right female breast ([place]) 02/29/2016: Right breast palpable mass (with silicone implants 9509), 3.5 cm on MRI, additional 3 cm anterior linear enhancement (biopsy 03/14/2016 IDC grade 3); 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  T2 N0 stage 2A clinical stage  Treatment summary: 1. Neoadjuvant chemotherapy with dose dense Adriamycin and Cytoxan 4 followed by Abraxane weekly 5(stopped early due to neuropathy) 2. 09/13/2016: Right lumpectomy: IDC grade 3, 2 foci, 2 cm and 1.1 cm, 0/3 lymph nodes negative margins negative, ER 0%, PR 0%, HER-2 negative ratio 1.02, Ki-67 60%, RCB-II; ypT2ypN0 Stage 2A (with plastic surgery removing the ruptured implant) 3. Followed by radiation therapy completed 12/06/2016 4.Adjuvant Xeloda 1000 mg by mouth twice a day 2 weeks on one week off 12/30/2016-Oct 2018 5.SWOG 1418 clinical trial Pembrolizumabcycle 1 given 05/03/2017(patient decided to discontinue clinical trial) ----------------------------------------------------------------------------------------------------------------------------------------- Surveillance: 1.Breast exam 04/24/2019: Benign 2.mammogram11/3/2020at [facility]: No evidence of malignancy. Breast density categoryB  Chronic fatigue Chemo-induced peripheral neuropathy: Monitoring Return to clinic in 6 months with labs and follow-up

## 2019-10-22 NOTE — Research (Signed)
30 Months Follow Up Visit for [id number] Clinical Trial:  Patient into clinic today to see Dr. Lindi Adie for 30 month study visit.  PROs; None required today. Labs;Completed per protocol and reviewed by Dr. Lindi Adie. Concomitant medicationsreviewedwith patient;Medication list is up to date.    H&P, PS, Toxicity and Recurrence assessment;  Completed by Dr. Lindi Adie. See MD encounter note from today.  Thanked patient very much for her time today and ongoingparticipation in this study. Her next study visit is due 05/01/20 (+/- 4 weeks) Asked patient to call research nurse if any questions or problems prior to next study visit.  She verbalized understanding.  Foye Spurling, BSN, RN Clinical Research Nurse 10/22/2019

## 2019-11-13 ENCOUNTER — Telehealth: Payer: Self-pay

## 2019-11-13 NOTE — Telephone Encounter (Signed)
Patient would need an appointment to discuss starting anti-depressant. Please assist with scheduling, thanks.

## 2019-11-13 NOTE — Telephone Encounter (Signed)
Patient would like to start on an anti depressant. She says that Gabapentin is "not cuttin' it".    Please call patient [phone]

## 2019-11-14 ENCOUNTER — Other Ambulatory Visit: Payer: Self-pay

## 2019-11-14 ENCOUNTER — Emergency Department (HOSPITAL_COMMUNITY)
Admission: EM | Admit: 2019-11-14 | Discharge: 2019-11-15 | Disposition: A | Discharge status: Home / Self Care | Payer: Medicare Other | Attending: Emergency Medicine | Admitting: Emergency Medicine

## 2019-11-14 DIAGNOSIS — Z79890 Hormone replacement therapy (postmenopausal): Secondary | ICD-10-CM | POA: Diagnosis not present

## 2019-11-14 DIAGNOSIS — Z7984 Long term (current) use of oral hypoglycemic drugs: Secondary | ICD-10-CM | POA: Insufficient documentation

## 2019-11-14 DIAGNOSIS — I10 Essential (primary) hypertension: Secondary | ICD-10-CM | POA: Diagnosis not present

## 2019-11-14 DIAGNOSIS — E119 Type 2 diabetes mellitus without complications: Secondary | ICD-10-CM | POA: Diagnosis not present

## 2019-11-14 DIAGNOSIS — Z20822 Contact with and (suspected) exposure to covid-19: Secondary | ICD-10-CM | POA: Insufficient documentation

## 2019-11-14 DIAGNOSIS — Z86718 Personal history of other venous thrombosis and embolism: Secondary | ICD-10-CM | POA: Diagnosis not present

## 2019-11-14 DIAGNOSIS — F322 Major depressive disorder, single episode, severe without psychotic features: Secondary | ICD-10-CM | POA: Insufficient documentation

## 2019-11-14 DIAGNOSIS — F339 Major depressive disorder, recurrent, unspecified: Secondary | ICD-10-CM | POA: Diagnosis present

## 2019-11-14 DIAGNOSIS — E039 Hypothyroidism, unspecified: Secondary | ICD-10-CM | POA: Insufficient documentation

## 2019-11-14 DIAGNOSIS — F32A Depression, unspecified: Secondary | ICD-10-CM

## 2019-11-14 DIAGNOSIS — Z79899 Other long term (current) drug therapy: Secondary | ICD-10-CM | POA: Diagnosis not present

## 2019-11-14 DIAGNOSIS — Z880 Allergy status to penicillin: Secondary | ICD-10-CM | POA: Insufficient documentation

## 2019-11-14 DIAGNOSIS — Z87891 Personal history of nicotine dependence: Secondary | ICD-10-CM | POA: Diagnosis not present

## 2019-11-14 DIAGNOSIS — F329 Major depressive disorder, single episode, unspecified: Secondary | ICD-10-CM | POA: Diagnosis present

## 2019-11-14 DIAGNOSIS — F1020 Alcohol dependence, uncomplicated: Secondary | ICD-10-CM | POA: Diagnosis not present

## 2019-11-14 LAB — COMPREHENSIVE METABOLIC PANEL
ALT: 37 U/L (ref 0–44)
AST: 30 U/L (ref 15–41)
Albumin: 4.5 g/dL (ref 3.5–5.0)
Alkaline Phosphatase: 58 U/L (ref 38–126)
Anion gap: 7 (ref 5–15)
BUN: 22 mg/dL (ref 8–23)
CO2: 25 mmol/L (ref 22–32)
Calcium: 9.2 mg/dL (ref 8.9–10.3)
Chloride: 106 mmol/L (ref 98–111)
Creatinine, Ser: 0.91 mg/dL (ref 0.44–1.00)
GFR calc Af Amer: >60 mL/min (ref >60)
GFR calc non Af Amer: >60 mL/min (ref >60)
Glucose, Bld: 189 mg/dL — ABNORMAL HIGH (ref 70–99)
Potassium: 4.2 mmol/L (ref 3.5–5.1)
Sodium: 138 mmol/L (ref 135–145)
Total Bilirubin: 1.4 mg/dL — ABNORMAL HIGH (ref 0.3–1.2)
Total Protein: 7.5 g/dL (ref 6.5–8.1)

## 2019-11-14 LAB — CBC WITH DIFFERENTIAL/PLATELET
Abs Immature Granulocytes: 0.01 10*3/uL (ref 0.00–0.07)
Basophils Absolute: 0 10*3/uL (ref 0.0–0.1)
Basophils Relative: 1 %
Eosinophils Absolute: 0.1 10*3/uL (ref 0.0–0.5)
Eosinophils Relative: 2 %
HCT: 38.3 % (ref 36.0–46.0)
Hemoglobin: 12.7 g/dL (ref 12.0–15.0)
Immature Granulocytes: 0 %
Lymphocytes Relative: 32 %
Lymphs Abs: 1.5 10*3/uL (ref 0.7–4.0)
MCH: 32.6 pg (ref 26.0–34.0)
MCHC: 33.2 g/dL (ref 30.0–36.0)
MCV: 98.5 fL (ref 80.0–100.0)
Monocytes Absolute: 0.4 10*3/uL (ref 0.1–1.0)
Monocytes Relative: 9 %
Neutro Abs: 2.5 10*3/uL (ref 1.7–7.7)
Neutrophils Relative %: 56 %
Platelets: 136 10*3/uL — ABNORMAL LOW (ref 150–400)
RBC: 3.89 MIL/uL (ref 3.87–5.11)
RDW: 12.8 % (ref 11.5–15.5)
WBC: 4.5 10*3/uL (ref 4.0–10.5)
nRBC: 0 % (ref 0.0–0.2)

## 2019-11-14 LAB — CBG MONITORING, ED: Glucose-Capillary: 145 mg/dL — ABNORMAL HIGH (ref 70–99)

## 2019-11-14 LAB — RAPID URINE DRUG SCREEN, HOSP PERFORMED
Amphetamines: NOT DETECTED
Barbiturates: NOT DETECTED
Benzodiazepines: NOT DETECTED
Cocaine: NOT DETECTED
Opiates: NOT DETECTED
Tetrahydrocannabinol: NOT DETECTED

## 2019-11-14 LAB — ACETAMINOPHEN LEVEL: Acetaminophen (Tylenol), Serum: <10 ug/mL — ABNORMAL LOW (ref 10–30)

## 2019-11-14 LAB — ETHANOL: Alcohol, Ethyl (B): <10 mg/dL (ref <10)

## 2019-11-14 LAB — SARS CORONAVIRUS 2 BY RT PCR (HOSPITAL ORDER, PERFORMED IN ~~LOC~~ HOSPITAL LAB): SARS Coronavirus 2: NEGATIVE

## 2019-11-14 LAB — SALICYLATE LEVEL: Salicylate Lvl: <7 mg/dL — ABNORMAL LOW (ref 7.0–30.0)

## 2019-11-14 MED ORDER — LORAZEPAM 1 MG PO TABS: 0.0000 mg | ORAL_TABLET | Freq: Four times a day (QID) | ORAL | Status: DC

## 2019-11-14 MED ORDER — LORAZEPAM 1 MG PO TABS: 0.0000 mg | ORAL_TABLET | Freq: Two times a day (BID) | ORAL | Status: DC

## 2019-11-14 MED ORDER — GABAPENTIN 300 MG PO CAPS
300.0000 mg | ORAL_CAPSULE | Freq: Three times a day (TID) | ORAL | Status: DC
  Administered 2019-11-14 – 2019-11-15 (×3): 300 mg via ORAL
  Filled 2019-11-14 (×3): qty 1

## 2019-11-14 MED ORDER — LORAZEPAM 2 MG/ML IJ SOLN: 0.0000 mg | Freq: Two times a day (BID) | INTRAMUSCULAR | Status: DC

## 2019-11-14 MED ORDER — METFORMIN HCL ER 500 MG PO TB24
1000.0000 mg | ORAL_TABLET | Freq: Every day | ORAL | Status: DC
  Administered 2019-11-14: 1000 mg via ORAL
  Filled 2019-11-14 (×2): qty 2

## 2019-11-14 MED ORDER — LEVOTHYROXINE SODIUM 112 MCG PO TABS
112.0000 ug | ORAL_TABLET | Freq: Every day | ORAL | Status: DC
  Administered 2019-11-15: 112 ug via ORAL
  Filled 2019-11-14: qty 1

## 2019-11-14 MED ORDER — THIAMINE HCL 100 MG PO TABS
100.0000 mg | ORAL_TABLET | Freq: Every day | ORAL | Status: DC
  Administered 2019-11-15: 100 mg via ORAL
  Filled 2019-11-14: qty 1

## 2019-11-14 MED ORDER — LORAZEPAM 2 MG/ML IJ SOLN: 0.0000 mg | Freq: Four times a day (QID) | INTRAMUSCULAR | Status: DC

## 2019-11-14 MED ORDER — THIAMINE HCL 100 MG/ML IJ SOLN: 100.0000 mg | Freq: Every day | INTRAMUSCULAR | Status: DC

## 2019-11-14 MED ORDER — ATORVASTATIN CALCIUM 80 MG PO TABS
80.0000 mg | ORAL_TABLET | Freq: Every evening | ORAL | Status: DC
  Administered 2019-11-14: 80 mg via ORAL
  Filled 2019-11-14 (×2): qty 1

## 2019-11-14 MED ORDER — ZOLPIDEM TARTRATE 5 MG PO TABS
5.0000 mg | ORAL_TABLET | Freq: Every evening | ORAL | Status: DC | PRN
  Administered 2019-11-14: 5 mg via ORAL
  Filled 2019-11-14: qty 1

## 2019-11-14 MED ORDER — LISINOPRIL 20 MG PO TABS
30.0000 mg | ORAL_TABLET | Freq: Every day | ORAL | Status: DC
  Administered 2019-11-14 – 2019-11-15 (×2): 30 mg via ORAL
  Filled 2019-11-14 (×2): qty 1
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
## 2019-11-14 NOTE — ED Triage Notes (Signed)
Pt came to [facility] d/t depression. Pt states she has been feeling stressors". Pt has hx of depression. Pt currently not on medication for depression. Pt calm, cooperative

## 2019-11-14 NOTE — ED Provider Notes (Signed)
[facility] DEPT Provider Note   CSN: [id number] Arrival date & time: 11/14/19  1543    History Chief Complaint  Patient presents with  . Depression   Marissa Hall is a 79 y.o. female with prior medical history of breast cancer, in remission, followed by Dr. Quintella Reichert, depression, diabetes, hypertension, hyperlipidemia who presents for evaluation of depression.  States she has had chronic depression however has become worse the past few weeks.  She notes 3 nights ago she was drinking alcohol which he does daily when she states she had thoughts of self-harm without plan.  Denies any current SI, HI, AVH. Patient is on any medication for depression.  [place].  Was seen by her therapist yesterday who recommended evaluation in the emergency department for possible inpatient treatment.  Notes increased stressors, lack of appetite, decrease sleep at home.  She does drink alcohol daily, 1 or 2 drinks daily.  States she can go days without any withdrawal symptoms.  She denies prior history of DTs or withdrawal seizures.  No illicit substances.  Denies fever, chills, nausea, vomiting, chest pain, shortness of breath abdominal pain, diarrhea, dysuria.  No recent Covid exposures.  Denies additional aggravating or relieving factors.  History obtained from patient and past medical records. No interpretor was used.  HPI     Past Medical History:  Diagnosis Date  . Acute DVT (deep venous thrombosis) ([place]) 04/23/2019   Right popliteal  . Anxiety and depression 1964   oncologist started duloxetine 12/2016  . Breast cancer ([place]) 03/14/2016   Clinical stage 2A: (triple neg): Right breast, upper inner quadrant, 03/2016.  Neoadjuvant chemo x 5 cycles,lumpectomy 4 mo later, then RT started 10/2016.  Adjuvant Xeloda [id number].  SWOG research trial pt 04/2017--pt randomized to pembrolizumab immunotherapy.  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 Cancer-free at 05/2018 onc f/u.  [name] Kitchen Cataracts, bilateral 07/2017  . 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 Depression 1964   Patient states since age 13  . Diabetes mellitus with complication ([place]) 2725   managed by endocrinology.  A1c Mar 12, 2018 was 7.0% at Dr. Shirlyn Goltz.   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  Excision done 02/2019  . GERD (gastroesophageal reflux disease) 2013  . Hyperlipidemia 1986  . Hypertension 2008  . Hypothyroidism 1988   Diagnosed in her 25s.  Managed by Endocrinologist  . Lumbar radiculopathy 04/2019   Dr. Tamala Julian to get plain films of LB and hip (considering MRI due to her hx of cancer)  . Osteoporosis 2015   pt states "osteopenia", but then says that she refused to take the rx med for this condition, so I suspect she had osteoporosis.  . Peripheral neuropathy 2017   Patient states diabetic neuropathy in feet prior to starting chemotherapy and then worsened by chemo.   [name] Kitchen TIA (transient ischemic attack) 03/26/2011    Patient Active Problem List   Diagnosis Date Noted  . Piriformis syndrome of right side 05/16/2019  . Lower extremity pain, right 04/23/2019  . DVT (deep venous thrombosis) ([place]) 04/23/2019  . Lumbar radiculopathy, right 04/02/2019  . Other long term (current) drug therapy 06/14/2017  . Anxiety and depression   . History of breast cancer 03/02/2017  . History of therapeutic radiation 03/02/2017  . Breast pain, right 10/03/2016  .  Type 2 diabetes mellitus ([place]) 08/23/2016  . Hypertension associated with diabetes ([place]) 08/23/2016  . Hypothyroidism 08/23/2016  . Port catheter in place 05/09/2016  . Peripheral neuropathy 05/09/2016  . Breast cancer of upper-inner quadrant of right female breast ([place]) 03/09/2016  . Major depressive disorder, recurrent ([place]) 02/27/2014  . Uncomplicated  alcohol dependence ([place]) 02/19/2014    Past Surgical History:  Procedure Laterality Date  . ABDOMINAL HYSTERECTOMY  1972  . APPENDECTOMY  1972  . BREAST ENHANCEMENT SURGERY  1982  . BREAST IMPLANT REMOVAL Right 09/13/2016   Procedure: REMOVAL RIGHT BREAST IMPLANT;  Surgeon: Irene Limbo, MD;  Location: [facility];  Service: Plastics;  Laterality: Right;  . BREAST LUMPECTOMY WITH RADIOACTIVE SEED AND SENTINEL LYMPH NODE BIOPSY Right 09/13/2016   Procedure: RIGHT BREAST LUMPECTOMY WITH RADIOACTIVE SEED X 2 AND SENTINEL LYMPH NODE BIOPSY;  Surgeon: Alphonsa Overall, MD;  Location: [facility];  Service: General;  Laterality: Right;  . BREAST SURGERY Right 03/14/2016   Biopsy  . CAPSULECTOMY Right 09/13/2016   Procedure: RIGHT CAPSULECTOMY;  Surgeon: Irene Limbo, MD;  Location: [facility];  Service: Plastics;  Laterality: Right;  . CATARACT EXTRACTION, BILATERAL Bilateral 08/10/17 right eye, 08/31/17 left eye  . MASS EXCISION Left 02/28/2018   Path: benign.  Procedure: EXCISIONLEFT MEDIAL THIGH MASS ERAS PATHWAY;  Surgeon: Erroll Luna, MD;  Location: [facility];  Service: General;  Laterality: Left;  . PORTACATH PLACEMENT N/A 03/15/2016   Procedure: INSERTION PORT-A-CATH WITH [place];  Surgeon: Alphonsa Overall, MD;  Location: [facility];  Service: General;  Laterality: N/A;  . PORTACATH REMOVAL  07/2017  . surgical repair left ankle Left 2009   s/p Fall   . TONSILLECTOMY AND ADENOIDECTOMY  1948   Age 51     OB History    Gravida  1   Para  1   Term      Preterm      AB      Living  1     SAB      TAB      Ectopic      Multiple      Live Births              Family History  Problem Relation Age of Onset  . Stroke Mother   . Suicidality Father   . Stroke Brother   . Stroke Son     Social History   Tobacco Use  . Smoking status: Former Smoker    Packs/day: 1.00    Types: Cigarettes    Quit date: 03/08/1999      Years since quitting: 20.7  . Smokeless tobacco: Never Used  Vaping Use  . Vaping Use: Never used  Substance Use Topics  . Alcohol use: Yes    Alcohol/week: 7.0 standard drinks    Types: 7 Shots of liquor per week    Comment:  daily  . Drug use: No    Home Medications Prior to Admission medications   Medication Sig Start Date End Date Taking? Authorizing Provider  atorvastatin (LIPITOR) 80 MG tablet Take 80 mg by mouth every evening.  02/12/16  Yes [provider]  cholecalciferol (VITAMIN D3) 25 MCG (1000 UNIT) tablet Take 1,000 Units by mouth daily.   Yes [provider]  gabapentin (NEURONTIN) 300 MG capsule Take 300 mg by mouth See admin instructions. Takes 300 mg in the morning and 600 mg at bedtime. 08/26/19  Yes [provider]  levothyroxine (SYNTHROID) 112 MCG tablet 112 mcg daily before breakfast.  06/20/19  Yes [provider]  lisinopril (ZESTRIL) 30 MG tablet Take 1 tablet (30 mg total) by mouth daily. 05/28/19  Yes McGowen, Adrian Blackwater, MD  metFORMIN (GLUCOPHAGE-XR) 500 MG 24 hr tablet Take 1,000 mg by mouth daily before supper.  10/10/17  Yes [provider]  NONFORMULARY OR COMPOUNDED ITEM Estradiol vaginal cream 0.02% insert 1/4 of an applicator vaginally and thin layer vulva daily x 2 weeks.  Then 1/4 of an applicator vaginally and a thin layer on the vulva twice a week for long term Patient taking differently: Apply 1 application topically See admin instructions. Use 1/4 of an applicator vaginally and a thin layer on the vulva twice a week. 09/05/19  Yes Princess Bruins, MD  Propylene Glycol (SYSTANE BALANCE OP) Place 1 drop into both eyes daily as needed (for dry eyes).   Yes [provider]  zaleplon (SONATA) 10 MG capsule Take 10 mg by mouth at bedtime as needed. Patient not taking: Reported on 11/14/2019 08/22/19   [provider]    Allergies    Other and Penicillins  Review of Systems   Review of Systems   Constitutional: Negative.   HENT: Negative.   Respiratory: Negative.   Cardiovascular: Negative.   Gastrointestinal: Negative.   Genitourinary: Negative.   Musculoskeletal: Negative.   Skin: Negative.   Neurological: Negative.   Psychiatric/Behavioral: Positive for sleep disturbance. The patient is nervous/anxious.   All other systems reviewed and are negative.   Physical Exam Updated Vital Signs BP (!) 154/66 (BP Location: Left Arm)   Pulse 67   Temp 98.5 F (36.9 C) (Oral)   Resp 18   SpO2 97%   Physical Exam Vitals and nursing note reviewed.  Constitutional:      General: She is not in acute distress.    Appearance: She is well-developed. She is not ill-appearing, toxic-appearing or diaphoretic.  HENT:     Head: Normocephalic and atraumatic.     Nose: Nose normal.     Mouth/Throat:     Mouth: Mucous membranes are moist.  Eyes:     Pupils: Pupils are equal, round, and reactive to light.  Cardiovascular:     Rate and Rhythm: Normal rate.     Pulses: Normal pulses.     Heart sounds: Normal heart sounds.  Pulmonary:     Effort: Pulmonary effort is normal. No respiratory distress.     Breath sounds: Normal breath sounds.  Abdominal:     General: Bowel sounds are normal. There is no distension.     Tenderness: There is no abdominal tenderness. There is no right CVA tenderness, left CVA tenderness, guarding or rebound.  Musculoskeletal:        General: No swelling, tenderness, deformity or signs of injury. Normal range of motion.     Cervical back: Normal range of motion.     Right lower leg: No edema.     Left lower leg: No edema.  Skin:    General: Skin is warm and dry.     Capillary Refill: Capillary refill takes less than 2 seconds.  Neurological:     General: No focal deficit present.     Mental Status: She is alert and oriented to person, place, and time.  Psychiatric:     Comments: Admits to depression and anxiety.  Thought process linear.  Denies SI, HI,  AVH.  Admits to decreased sleep and poor appetite.  ED Results / Procedures / Treatments   Labs (all labs ordered are listed, but only abnormal results are displayed) Labs Reviewed  COMPREHENSIVE METABOLIC PANEL - Abnormal; Notable for the following components:      Result Value   Glucose, Bld 189 (*)    Total Bilirubin 1.4 (*)    All other components within normal limits  CBC WITH DIFFERENTIAL/PLATELET - Abnormal; Notable for the following components:   Platelets 136 (*)    All other components within normal limits  SALICYLATE LEVEL - Abnormal; Notable for the following components:   Salicylate Lvl <5.8 (*)    All other components within normal limits  ACETAMINOPHEN LEVEL - Abnormal; Notable for the following components:   Acetaminophen (Tylenol), Serum <10 (*)    All other components within normal limits  CBG MONITORING, ED - Abnormal; Notable for the following components:   Glucose-Capillary 145 (*)    All other components within normal limits  SARS CORONAVIRUS 2 BY RT PCR (HOSPITAL ORDER, [facility])  ETHANOL  RAPID URINE DRUG SCREEN, HOSP PERFORMED    EKG None  Radiology No results found.  Procedures Procedures (including critical care time)  Medications Ordered in ED Medications  atorvastatin (LIPITOR) tablet 80 mg (80 mg Oral Given 11/14/19 2043)  gabapentin (NEURONTIN) capsule 300 mg (300 mg Oral Given 11/14/19 2041)  levothyroxine (SYNTHROID) tablet 112 mcg (has no administration in time range)  lisinopril (ZESTRIL) tablet 30 mg (30 mg Oral Given 11/14/19 2042)  metFORMIN (GLUCOPHAGE-XR) 24 hr tablet 1,000 mg (1,000 mg Oral Given 11/14/19 2042)  zolpidem (AMBIEN) tablet 5 mg (5 mg Oral Given 11/14/19 2043)  LORazepam (ATIVAN) injection 0-4 mg (has no administration in time range)    Or  LORazepam (ATIVAN) tablet 0-4 mg (has no administration in time range)  LORazepam (ATIVAN) injection 0-4 mg (has no administration in time range)     Or  LORazepam (ATIVAN) tablet 0-4 mg (has no administration in time range)  thiamine tablet 100 mg (has no administration in time range)    Or  thiamine (B-1) injection 100 mg (has no administration in time range)    ED Course  I have reviewed the triage vital signs and the nursing notes.  Pertinent labs & imaging results that were available during my care of the patient were reviewed by me and considered in my medical decision making (see chart for details).  79 year old female with history of depression not on any medication presents for evaluation of depression and anxiety.  Is having acute on chronic worsening depression.  Seen by therapist yesterday who recommended evaluation in the ED for possible inpatient management.  [place].  She is having decreased sleep and poor appetite.  States she has no social support.  Heart lungs clear.  Abdomen soft.  She is linear thought process and is goal oriented.  She denies SI, HI, AVH.    Labs and imaging personally reviewed and interpreted: CBC without leukocytosis, and about panel with mild hyperglycemia 29, T bili 1.4. She denies any abdominal pain. Salicylate <7, Acetaminophen <10, Ethanol <10  Patient medically cleared. Pending TTS Assessment. Home meds ordered. Psych hold orders placed.  Psychiatry with recommendations for overnight obs.  Due to chronic alcohol use CIWA orders placed.  She does not appear actively withdrawing at this time.    MDM Rules/Calculators/A&P  Final Clinical Impression(s) / ED Diagnoses Final diagnoses:  Depression, unspecified depression type    Rx / DC Orders ED Discharge Orders    None       Anapaola Kinsel A, PA-C 11/14/19 2204    Gareth Morgan, MD 11/15/19 1639

## 2019-11-14 NOTE — Telephone Encounter (Signed)
Called patient - left voicemail - patient needs to schedule appt to discuss starting medication (see Britt's message below)

## 2019-11-14 NOTE — ED Notes (Signed)
Patient wanded by security. 

## 2019-11-15 DIAGNOSIS — F322 Major depressive disorder, single episode, severe without psychotic features: Secondary | ICD-10-CM | POA: Diagnosis not present

## 2019-11-15 LAB — CBG MONITORING, ED: Glucose-Capillary: 164 mg/dL — ABNORMAL HIGH (ref 70–99)

## 2019-11-15 MED ORDER — TRAZODONE HCL 50 MG PO TABS: 50.0000 mg | ORAL_TABLET | Freq: Every day | ORAL | 0 refills | Status: DC

## 2019-11-15 MED ORDER — TRAZODONE HCL 50 MG PO TABS: 50.0000 mg | ORAL_TABLET | Freq: Every day | ORAL | Status: DC

## 2019-11-15 MED ORDER — DULOXETINE HCL 20 MG PO CPEP
20.0000 mg | ORAL_CAPSULE | Freq: Every day | ORAL | Status: DC
  Administered 2019-11-15: 20 mg via ORAL
  Filled 2019-11-15: qty 1

## 2019-11-15 MED ORDER — ACETAMINOPHEN 500 MG PO TABS
1000.0000 mg | ORAL_TABLET | Freq: Once | ORAL | Status: AC
  Administered 2019-11-15: 1000 mg via ORAL
  Filled 2019-11-15: qty 2

## 2019-11-15 MED ORDER — DULOXETINE HCL 20 MG PO CPEP: 20.0000 mg | ORAL_CAPSULE | Freq: Every day | ORAL | 0 refills | Status: DC

## 2019-11-15 NOTE — BH Assessment (Signed)
[facility] Assessment Progress Note  Per Marvia Pickles, NP, this pt does not require psychiatric hospitalization at this time.  Pt is to be discharged from [facility] with recommendation to continue treatment at the [facility].  This has been included in pt's discharge instructions.  Pt's nurse, Tilda Franco, has been notified.  Jalene Mullet, [facility] Triage Specialist [phone]

## 2019-11-15 NOTE — ED Provider Notes (Signed)
Emergency Medicine Observation Re-evaluation Note  Marissa Hall is a 79 y.o. female, seen on rounds today.  Pt initially presented to the ED for complaints of Depression Currently, the patient is sleeping.  Physical Exam  BP (!) 149/64 (BP Location: Right Arm)   Pulse (!) 50   Temp 97.8 F (36.6 C) (Oral)   Resp 16   SpO2 99%  Physical Exam Vitals and nursing note reviewed.  Constitutional:      General: She is not in acute distress.    Appearance: Normal appearance. She is not toxic-appearing.  HENT:     Head: Normocephalic.  Pulmonary:     Effort: Pulmonary effort is normal. No respiratory distress.  Musculoskeletal:        General: No deformity or signs of injury.     ED Course / MDM  EKG:    I have reviewed the labs performed to date as well as medications administered while in observation.  Recent changes in the last 24 hours include awaiting re eval. Plan  Current plan is for am re eval by psych. Patient is not under full IVC at this time.   Lucrezia Starch, MD 11/15/19 [phone]

## 2019-11-15 NOTE — ED Provider Notes (Signed)
RN indicates patient re-evaluated and discharged by [facility] team. bp is high, hx same.   Patient recently given dose of her normal bp med. Pt notes mild headache, gradual onset, similar to prior headaches. No acute, abrupt or severe headaches. No change in speech or vision. No numbness/weakness. No cp or sob. No edema. From [facility] standpoint, pt notes is feeling much improved and ready for discharged.   Acetaminophen po. Po fluids.  PCP f/u re htn, and overall health.   [place] f/u re [place] concerns/mood disorder/depression.  Return precautions provided.      Lajean Saver, MD 11/15/19 1547

## 2019-11-15 NOTE — Consult Note (Signed)
[facility] Psych ED Discharge  11/15/2019 10:10 AM Marissa Hall  MRN:  [id number] Principal Problem: Major depressive disorder, recurrent [facility]) Discharge Diagnoses: Principal Problem:   Major depressive disorder, recurrent [facility])   Subjective:  79 year old female that presented to the ED on 11/14/19 for worsening depression and suicidal thoughts. She states three nights ago she had suicidal thoughts while drinking alcohol. Before she was able to come to the hospital for treatment she had to make arrangements for her pets to be taken care of. Today, she does not endorse any suicidal thoughts. She believes the alcohol is making her depression symptoms worse. She states she has 1-2 alcoholic beverages a day, but 3 days ago drank a full bottle of wine. She denies having any withdrawal symptoms. She also states she feels isolated and "does not have good support." She is currently employed and works a full time job and enjoys her job. She is not currently on an anti-depressant although she has been on effexor in the past. She is currently taking gabapentin for neuropathy due to her diabetes and reports nerve pain due to a history of breast cancer treatment with chemo. She reports taht in 2010 she was on Effexor XR and Lamictal for depression and mood but that was stopped.  Patient agrees to start Cymbalta to assist with pain, depression, and anxiety. She also reports taking Trazodone in the past for sleep and would like to start it again. She denies nay suicidal or homicidal ideation and denies any hallucinations.   Total Time spent with patient: 30 minutes  Past Psychiatric History: MDD, alcohol dependency, hospitalization in 2010. Current with [facility].  Past Medical History:  Past Medical History:  Diagnosis Date   Acute DVT (deep venous thrombosis) ([place]) 04/23/2019   Right popliteal   Anxiety and depression 1964   oncologist started duloxetine 12/2016   Breast cancer ([place]) 03/14/2016    Clinical stage 2A: (triple neg): Right breast, upper inner quadrant, 03/2016.  Neoadjuvant chemo x 5 cycles,lumpectomy 4 mo later, then RT started 10/2016.  Adjuvant Xeloda [id number].  SWOG research trial pt 04/2017--pt randomized to pembrolizumab immunotherapy.  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  A1c Mar 12, 2018 was 7.0% at Dr. Shirlyn Goltz.    Epidermoid cyst of vulva    Chronic epidermoid cyst of the vulva.  Excision done 02/2019   GERD (gastroesophageal reflux disease) 2013   Hyperlipidemia 1986   Hypertension 2008   Hypothyroidism 1988   Diagnosed in her 45s.  Managed by Endocrinologist   Lumbar radiculopathy 04/2019   Dr. Tamala Julian to get plain films of LB and hip (considering MRI due to her hx of cancer)   Osteoporosis 2015   pt states "osteopenia", but then says that she refused to take the rx med for this condition, so I suspect she had osteoporosis.   Peripheral neuropathy 2017   Patient states diabetic neuropathy in feet prior to starting chemotherapy and then worsened by chemo.    TIA (transient ischemic attack) 03/26/2011    Past Surgical History:  Procedure Laterality Date   [place]  SURGERY  1982   BREAST IMPLANT REMOVAL Right 09/13/2016   Procedure: REMOVAL RIGHT BREAST IMPLANT;  Surgeon: Irene Limbo, MD;  Location: [place];  Service: Plastics;  Laterality: Right;   BREAST LUMPECTOMY WITH RADIOACTIVE SEED AND SENTINEL LYMPH NODE BIOPSY Right 09/13/2016   Procedure: RIGHT BREAST LUMPECTOMY WITH RADIOACTIVE SEED X 2 AND SENTINEL LYMPH NODE BIOPSY;   Surgeon: Alphonsa Overall, MD;  Location: [place];  Service: General;  Laterality: Right;   BREAST SURGERY Right 03/14/2016   Biopsy   CAPSULECTOMY Right 09/13/2016   Procedure: RIGHT CAPSULECTOMY;  Surgeon: Irene Limbo, MD;  Location: [place];  Service: Plastics;  Laterality: Right;   CATARACT EXTRACTION, BILATERAL Bilateral 08/10/17 right eye, 08/31/17 left eye   MASS EXCISION Left 02/28/2018   Path: benign.  Procedure: EXCISIONLEFT MEDIAL THIGH MASS ERAS PATHWAY;  Surgeon: Erroll Luna, MD;  Location: [place];  Service: General;  Laterality: Left;   PORTACATH PLACEMENT N/A 03/15/2016   Procedure: INSERTION PORT-A-CATH WITH [place];  Surgeon: Alphonsa Overall, MD;  [facility];  Service: General;  Laterality: N/A;   PORTACATH REMOVAL  07/2017   surgical repair left ankle Left 2009   s/p Fall    TONSILLECTOMY AND ADENOIDECTOMY  1948   Age 26   Family History:  Family History  Problem Relation Age of Onset   Stroke Mother    Suicidality Father    Stroke Brother    Stroke Son    Family Psychiatric  History: Father-suicide Social History:  Social History   Substance and Sexual Activity  Alcohol Use Yes   Alcohol/week: 7.0 standard drinks   Types: 7 Shots of liquor per week   Comment:  daily     Social History   Substance and Sexual Activity  Drug Use No    Social History   Socioeconomic History   Marital status: Divorced    Spouse name: Not on file   Number of children: Not on file   Years of education: Not on file   Highest education level: Not on file  Occupational History   Not on file  Tobacco Use   Smoking status: Former Smoker    Packs/day: 1.00    Types: Cigarettes    Quit date: 03/08/1999    Years since quitting: 20.7   Smokeless tobacco: Never Used  Vaping Use   Vaping Use: Never used  Substance and Sexual Activity   Alcohol use: Yes    Alcohol/week: 7.0 standard drinks    Types:  7 Shots of liquor per week    Comment:  daily   Drug use: No   Sexual activity: Yes    Partners: Male    Comment: 1st intercourse- 42, partners- 10+, current partner- 8 yrs  Other Topics Concern   Not on file  Social History Narrative   Divorced, has a son who lives in [place].   Educ: college   Occup: Optometrist, still working part time.   Tob: quit 2000.     Alc: several times a week--1-2 glasses wine.   No drugs.   Social Determinants of Health   Financial Resource Strain:    Difficulty of Paying Living Expenses:   Food Insecurity:    Worried About Charity fundraiser in the Last Year:    Arboriculturist in the Last Year:   Transportation Needs:    Film/video editor (Medical):    Lack of Transportation (Non-Medical):   Physical  Activity:    Days of Exercise per Week:    Minutes of Exercise per Session:   Stress:    Feeling of Stress :   Social Connections:    Frequency of Communication with Friends and Family:    Frequency of Social Gatherings with Friends and Family:    Attends Religious Services:    Active Member of Clubs or Organizations:    Attends Music therapist:    Marital Status:     Has this patient used any form of tobacco in the last 30 days? (Cigarettes, Smokeless Tobacco, Cigars, and/or Pipes) A prescription for an FDA-approved tobacco cessation medication was offered at discharge and the patient refused  Current Medications: Current Facility-Administered Medications  Medication Dose Route Frequency Provider Last Rate Last Admin   atorvastatin (LIPITOR) tablet 80 mg  80 mg Oral QPM Henderly, Britni A, PA-C   80 mg at 11/14/19 2043   DULoxetine (CYMBALTA) DR capsule 20 mg  20 mg Oral Daily Dola Lunsford, Lowry Ram, FNP   20 mg at 11/15/19 0939   gabapentin (NEURONTIN) capsule 300 mg  300 mg Oral TID Henderly, Britni A, PA-C   300 mg at 11/15/19 0900   levothyroxine (SYNTHROID) tablet 112 mcg  112 mcg Oral Q0600 Henderly,  Britni A, PA-C   112 mcg at 11/15/19 0808   lisinopril (ZESTRIL) tablet 30 mg  30 mg Oral Daily Henderly, Britni A, PA-C   30 mg at 11/15/19 0900   LORazepam (ATIVAN) injection 0-4 mg  0-4 mg Intravenous Q6H Henderly, Britni A, PA-C       Or   LORazepam (ATIVAN) tablet 0-4 mg  0-4 mg Oral Q6H Henderly, Britni A, PA-C       [START ON 11/17/2019] LORazepam (ATIVAN) injection 0-4 mg  0-4 mg Intravenous Q12H Henderly, Britni A, PA-C       Or   [START ON 11/17/2019] LORazepam (ATIVAN) tablet 0-4 mg  0-4 mg Oral Q12H Henderly, Britni A, PA-C       metFORMIN (GLUCOPHAGE-XR) 24 hr tablet 1,000 mg  1,000 mg Oral QAC supper Henderly, Britni A, PA-C   1,000 mg at 11/14/19 2042   thiamine tablet 100 mg  100 mg Oral Daily Henderly, Britni A, PA-C   100 mg at 11/15/19 0900   Or   thiamine (B-1) injection 100 mg  100 mg Intravenous Daily Henderly, Britni A, PA-C       traZODone (DESYREL) tablet 50 mg  50 mg Oral QHS Lesean Woolverton, Darnelle Maffucci B, FNP       zolpidem (AMBIEN) tablet 5 mg  5 mg Oral QHS PRN Henderly, Britni A, PA-C   5 mg at 11/14/19 2043   Current Outpatient Medications  Medication Sig Dispense Refill   atorvastatin (LIPITOR) 80 MG tablet Take 80 mg by mouth every evening.      cholecalciferol (VITAMIN D3) 25 MCG (1000 UNIT) tablet Take 1,000 Units by mouth daily.     gabapentin (NEURONTIN) 300 MG capsule Take 300 mg by mouth See admin instructions. Takes 300 mg in the morning and 600 mg at bedtime.     levothyroxine (SYNTHROID) 112 MCG tablet 112 mcg daily before breakfast.      lisinopril (ZESTRIL) 30 MG tablet Take 1 tablet (30 mg total) by mouth daily. 90 tablet 1   metFORMIN (GLUCOPHAGE-XR) 500 MG 24 hr tablet Take 1,000 mg by mouth daily before supper.      NONFORMULARY OR COMPOUNDED ITEM Estradiol vaginal cream 0.02% insert 1/4 of an  applicator vaginally and thin layer vulva daily x 2 weeks.  Then 1/4 of an applicator vaginally and a thin layer on the vulva twice a week for long term  (Patient taking differently: Apply 1 application topically See admin instructions. Use 1/4 of an applicator vaginally and a thin layer on the vulva twice a week.) 90 each 3   Propylene Glycol (SYSTANE BALANCE OP) Place 1 drop into both eyes daily as needed (for dry eyes).     [START ON 11/16/2019] DULoxetine (CYMBALTA) 20 MG capsule Take 1 capsule (20 mg total) by mouth daily. 30 capsule 0   traZODone (DESYREL) 50 MG tablet Take 1 tablet (50 mg total) by mouth at bedtime. 30 tablet 0   PTA Medications: (Not in a hospital admission)   Musculoskeletal: Strength & Muscle Tone: within normal limits Gait & Station: normal Patient leans: N/A  Psychiatric Specialty Exam: Physical Exam Vitals and nursing note reviewed.  Constitutional:      Appearance: She is well-developed.  Pulmonary:     Effort: Pulmonary effort is normal.  Musculoskeletal:        General: Normal range of motion.  Skin:    General: Skin is warm.  Neurological:     Mental Status: She is alert and oriented to person, place, and time.     Review of Systems  Constitutional: Negative.   HENT: Negative.   Eyes: Negative.   Respiratory: Negative.   Cardiovascular: Negative.   Gastrointestinal: Negative.   Genitourinary: Negative.   Musculoskeletal: Negative.   Skin: Negative.   Neurological: Negative.   Psychiatric/Behavioral: Negative.     Blood pressure (!) 151/69, pulse (!) 58, temperature 97.8 F (36.6 C), temperature source Oral, resp. rate 16, SpO2 95 %.There is no height or weight on file to calculate BMI.  General Appearance: Fairly Groomed  Eye Contact:  Good  Speech:  Normal Rate  Volume:  Normal  Mood:  Depressed and Hopeless  Affect:  Depressed  Thought Process:  Coherent  Orientation:  NA  Thought Content:  Logical  Suicidal Thoughts:  No  Homicidal Thoughts:  No  Memory:  Immediate;   Good Recent;   Good Remote;   Good  Judgement:  Intact  Insight:  Good  Psychomotor Activity:  Normal   Concentration:  Concentration: Good and Attention Span: Good  Recall:  Good  Fund of Knowledge:  Good  Language:  Good  Akathisia:  Negative  Handed:  Right  AIMS (if indicated):     Assets:  Communication Skills Desire for Improvement Financial Resources/Insurance Housing Leisure Time [place] Talents/Skills Transportation Vocational/Educational  ADL's:  Intact  Cognition:  WNL  Sleep:      Assessment: Patient is seen by this provider via tele-psych and I have consulted with Dr. Mallie Darting. Patient presents in her room sitting on the bed. She is pleasant, calm, and cooperative. She has good insight and is established with [facility] and has plans to attend their IOP in the evenings so she can still work. She does not feel she needs a residential treatment for her alcohol use. She denies any withdrawals and that she plans to stop drinking alcohol. She has continually denied any suicidal or homicidal ideations and denies any hallucinations.   Demographic Factors:  Age 33 or older and Living alone  Loss Factors: Decline in physical health  Historical Factors: NA  Risk Reduction Factors:   Employed and Positive coping skills or problem solving skills  Continued Clinical Symptoms:  Depression:   Comorbid alcohol abuse/dependence Alcohol/Substance Abuse/Dependencies More than one psychiatric diagnosis Previous Psychiatric Diagnoses and Treatments Medical Diagnoses and Treatments/Surgeries  Cognitive Features That Contribute To Risk:  None    Suicide Risk:  Mild:  Suicidal ideation of limited frequency, intensity, duration, and specificity.  There are no identifiable plans, no associated intent, mild dysphoria and related symptoms, good self-control (both objective and subjective assessment), few other risk factors, and identifiable protective factors, including available and accessible social support.    Plan Of Care/Follow-up  recommendations:  Continue activity as tolerated. Continue diet as recommended by your PCP. Ensure to keep all appointments with outpatient providers. Start Cymbalta 20 mg PO Daily and Trazodone 50 mg PO QHS and follow up with outpatient provider for titration. Peer support consult placed.  Disposition: Discharge home with outpatient resources provided by [facility]TS. Follow up with [facility] on 11/19/19 Lewis Shock, FNP 11/15/2019, 10:10 AM

## 2019-11-15 NOTE — Discharge Instructions (Addendum)
It was our pleasure to provide your ER care today - we hope that you feel better.  For your behavioral health needs, you are advised to continue treatment at the [facility]:       [facility]      [place], [place] [id number]      [phone]  For high blood pressure, continue your blood pressure medication, limit salt intake, monitor blood pressure, and follow up with primary care doctor in 1 week.  Return to ER if worse, new symptoms, chest pain, trouble breathing, severe headache, numbness/weakness, change in speech or vision, severe depression, thoughts of self harm or other concern.

## 2019-11-15 NOTE — Progress Notes (Signed)
Notified Psych NP of patient BP 181/68 and patient concern if Cymbalta would have caused the increase in BP. Per Darnelle Maffucci Cymbalta should not have caused the headache and increase in BP, but advised ED MD to see patient prior to D/C. Will notify ED MD.

## 2019-11-15 NOTE — Progress Notes (Signed)
11/15/2019  ED MD at bedside speaking with patient.

## 2019-11-15 NOTE — Patient Outreach (Signed)
CPSS met with Pt an was able to gain information to better assist Pt. Pt discussed the concerns that were becoming to cause problems on her life an that she wants to grab a hold of before it gets out of hand. CPSS processed with Pt about [facility] outpatient program. CPSS addressed the fact that Pt can receive counseling services and get involved Behavior Health members an create a plan to work to wards reaching her goals.

## 2019-11-27 ENCOUNTER — Other Ambulatory Visit: Payer: Self-pay | Admitting: Family Medicine

## 2019-12-18 ENCOUNTER — Telehealth: Payer: Self-pay

## 2019-12-18 ENCOUNTER — Ambulatory Visit: Payer: Medicare Other | Admitting: Family Medicine

## 2019-12-18 DIAGNOSIS — Z0289 Encounter for other administrative examinations: Secondary | ICD-10-CM

## 2019-12-18 NOTE — Telephone Encounter (Signed)
Patient came in right before 2:30pm today,  She thought her appt was at 2:30pm, but it was actually at 10:30AM.  She apologized, I apologized. All is good.  We just need to reschedule her and I am not sure when/where to schedule her at.  She needs refills on her medications, she states she will be running out if we have to go a few weeks out.  I am not sure what she was coming in for and I did not want to add her on without someone looking over her med list first and speaking with her.  Please call patient at [phone].

## 2019-12-18 NOTE — Telephone Encounter (Signed)
Patient was contacted and scheduled for ED/ [facility] f/u on 7/19 @ 1:30pm.

## 2019-12-18 NOTE — Progress Notes (Deleted)
OFFICE VISIT  12/18/2019   CC: No chief complaint on file.    HPI:    Patient is a 79 y.o. Caucasian female who presents for f/u diarrhea, anx/dep, insomnia. She has a chronic pain syndrome--suspected lumbar radiculopathy + chemo-induced peripheral neuropathy in feet.  I last saw her 2 mo ago for diarrhea: A/P as of last visit: "Intermittent diarrhea, sometimes no warning/no control. Not clearly related/secondary to any specific food or eating pattern or medication. 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 I recommended she start daily metamucil or benefiber supplement, keep diary of her daily food/drink intake and see if any pattern/connection with specific/general food. Monitor for new sx's or worsening/more persistent diarrhea. No GI referral or testing at this time but will consider in future if things change for the worse."  INTERIM HX: ***   PMP AWARE reviewed today: most recent rx for *** was filled ***, # ***, rx by ***. No red flags.  Past Medical History:  Diagnosis Date  . Acute DVT (deep venous thrombosis) ([place]) 04/23/2019   Right popliteal  . Anxiety and depression 1964   oncologist started duloxetine 12/2016  . Breast cancer ([place]) 03/14/2016   Clinical stage 2A: (triple neg): Right breast, upper inner quadrant, 03/2016.  Neoadjuvant chemo x 5 cycles,lumpectomy 4 mo later, then RT started 10/2016.  Adjuvant Xeloda [id number].  SWOG research trial pt 04/2017--pt randomized to pembrolizumab immunotherapy.  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  [name] Kitchen Cataracts, bilateral 07/2017  . 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 Depression 1964   Patient states since age 34  . Diabetes mellitus with complication ([place]) 1856   managed by endocrinology.  A1c Mar 12, 2018 was 7.0% at Dr. Shirlyn Goltz.   [name] Kitchen Epidermoid  cyst of vulva    Chronic epidermoid cyst of the vulva.  Excision done 02/2019  . GERD (gastroesophageal reflux disease) 2013  . Hyperlipidemia 1986  . Hypertension 2008  . Hypothyroidism 1988   Diagnosed in her 34s.  Managed by Endocrinologist  . Lumbar radiculopathy 04/2019   Dr. Tamala Julian to get plain films of LB and hip (considering MRI due to her hx of cancer)  . Osteoporosis 2015   pt states "osteopenia", but then says that she refused to take the rx med for this condition, so I suspect she had osteoporosis.  . Peripheral neuropathy 2017   Patient states diabetic neuropathy in feet prior to starting chemotherapy and then worsened by chemo.   [name] Kitchen TIA (transient ischemic attack) 03/26/2011    Past Surgical History:  Procedure Laterality Date  . ABDOMINAL HYSTERECTOMY  1972  . APPENDECTOMY  1972  . BREAST ENHANCEMENT SURGERY  1982  . BREAST IMPLANT REMOVAL Right 09/13/2016   Procedure: REMOVAL RIGHT BREAST IMPLANT;  Surgeon: Irene Limbo, MD;  Location: [place];  Service: Plastics;  Laterality: Right;  . BREAST LUMPECTOMY WITH RADIOACTIVE SEED AND SENTINEL LYMPH NODE BIOPSY Right 09/13/2016   Procedure: RIGHT BREAST LUMPECTOMY WITH RADIOACTIVE SEED X 2 AND SENTINEL LYMPH NODE BIOPSY;  Surgeon: Alphonsa Overall, MD;  Location: [place];  Service: General;  Laterality: Right;  . BREAST SURGERY Right 03/14/2016   Biopsy  . CAPSULECTOMY Right 09/13/2016   Procedure: RIGHT CAPSULECTOMY;  Surgeon: Irene Limbo, MD;  Location: [place];  Service: Plastics;  Laterality: Right;  . CATARACT EXTRACTION, BILATERAL  Bilateral 08/10/17 right eye, 08/31/17 left eye  . MASS EXCISION Left 02/28/2018   Path: benign.  Procedure: EXCISIONLEFT MEDIAL THIGH MASS ERAS PATHWAY;  Surgeon: Erroll Luna, MD;  Location: [place];  Service: General;  Laterality: Left;  . PORTACATH PLACEMENT N/A 03/15/2016   Procedure: INSERTION PORT-A-CATH WITH [place];   Surgeon: Alphonsa Overall, MD;  [facility];  Service: General;  Laterality: N/A;  . PORTACATH REMOVAL  07/2017  . surgical repair left ankle Left 2009   s/p Fall   . TONSILLECTOMY AND ADENOIDECTOMY  1948   Age 24    Outpatient Medications Prior to Visit  Medication Sig Dispense Refill  . atorvastatin (LIPITOR) 80 MG tablet Take 80 mg by mouth every evening.     . cholecalciferol (VITAMIN D3) 25 MCG (1000 UNIT) tablet Take 1,000 Units by mouth daily.    . DULoxetine (CYMBALTA) 20 MG capsule Take 1 capsule (20 mg total) by mouth daily. 30 capsule 0  . gabapentin (NEURONTIN) 300 MG capsule Take 300 mg by mouth See admin instructions. Takes 300 mg in the morning and 600 mg at bedtime.    [name] Kitchen levothyroxine (SYNTHROID) 112 MCG tablet 112 mcg daily before breakfast.     . lisinopril (ZESTRIL) 30 MG tablet Take 1 tablet (30 mg total) by mouth daily. 90 tablet 1  . metFORMIN (GLUCOPHAGE-XR) 500 MG 24 hr tablet Take 1,000 mg by mouth daily before supper.     . NONFORMULARY OR COMPOUNDED ITEM Estradiol vaginal cream 0.02% insert 1/4 of an applicator vaginally and thin layer vulva daily x 2 weeks.  Then 1/4 of an applicator vaginally and a thin layer on the vulva twice a week for long term (Patient taking differently: Apply 1 application topically See admin instructions. Use 1/4 of an applicator vaginally and a thin layer on the vulva twice a week.) 90 each 3  . Propylene Glycol (SYSTANE BALANCE OP) Place 1 drop into both eyes daily as needed (for dry eyes).    . traZODone (DESYREL) 50 MG tablet Take 1 tablet (50 mg total) by mouth at bedtime. 30 tablet 0   No facility-administered medications prior to visit.    Allergies  Allergen Reactions  . Other Other (See Comments)    STEROIDS- emotional  . Penicillins Other (See Comments)    Unsure of reaction, was 79 years old    ROS As per HPI  PE: There were no vitals taken for this visit. ***  LABS:  Lab Results  Component Value Date   TSH 0.50  09/20/2019   Lab Results  Component Value Date   WBC 4.5 11/14/2019   HGB 12.7 11/14/2019   HCT 38.3 11/14/2019   MCV 98.5 11/14/2019   PLT 136 (L) 11/14/2019   Lab Results  Component Value Date   CREATININE 0.91 11/14/2019   BUN 22 11/14/2019   NA 138 11/14/2019   K 4.2 11/14/2019   CL 106 11/14/2019   CO2 25 11/14/2019   Lab Results  Component Value Date   ALT 37 11/14/2019   AST 30 11/14/2019   ALKPHOS 58 11/14/2019   BILITOT 1.4 (H) 11/14/2019   Lab Results  Component Value Date   CHOL 185 09/20/2019   No results found for: HDL Lab Results  Component Value Date   LDLCALC 98 09/20/2019   Lab Results  Component Value Date   HGBA1C 7.2 09/20/2019    IMPRESSION AND PLAN:  No problem-specific Assessment & Plan notes found for this  encounter.   An After Visit Summary was printed and given to the patient.  FOLLOW UP: No follow-ups on file.  Signed:  Crissie Sickles, MD           12/18/2019

## 2019-12-23 ENCOUNTER — Ambulatory Visit (INDEPENDENT_AMBULATORY_CARE_PROVIDER_SITE_OTHER): Payer: Medicare Other | Admitting: Family Medicine

## 2019-12-23 ENCOUNTER — Encounter: Payer: Self-pay | Admitting: Family Medicine

## 2019-12-23 ENCOUNTER — Other Ambulatory Visit: Payer: Self-pay

## 2019-12-23 VITALS — BP 133/69 | HR 56 | Temp 97.8°F | Resp 16 | Ht 67.0 in | Wt 167.2 lb

## 2019-12-23 DIAGNOSIS — I10 Essential (primary) hypertension: Secondary | ICD-10-CM

## 2019-12-23 DIAGNOSIS — M792 Neuralgia and neuritis, unspecified: Secondary | ICD-10-CM

## 2019-12-23 DIAGNOSIS — E039 Hypothyroidism, unspecified: Secondary | ICD-10-CM | POA: Diagnosis not present

## 2019-12-23 DIAGNOSIS — F5105 Insomnia due to other mental disorder: Secondary | ICD-10-CM | POA: Diagnosis not present

## 2019-12-23 DIAGNOSIS — T451X5A Adverse effect of antineoplastic and immunosuppressive drugs, initial encounter: Secondary | ICD-10-CM

## 2019-12-23 DIAGNOSIS — F99 Mental disorder, not otherwise specified: Secondary | ICD-10-CM

## 2019-12-23 DIAGNOSIS — G620 Drug-induced polyneuropathy: Secondary | ICD-10-CM

## 2019-12-23 DIAGNOSIS — F3341 Major depressive disorder, recurrent, in partial remission: Secondary | ICD-10-CM

## 2019-12-23 MED ORDER — TRAZODONE HCL 50 MG PO TABS: 50.0000 mg | ORAL_TABLET | Freq: Every day | ORAL | 3 refills | Status: DC

## 2019-12-23 MED ORDER — GABAPENTIN 300 MG PO CAPS: 300.0000 mg | ORAL_CAPSULE | ORAL | 3 refills | Status: DC

## 2019-12-23 MED ORDER — DULOXETINE HCL 30 MG PO CPEP: 30.0000 mg | ORAL_CAPSULE | Freq: Every day | ORAL | 1 refills | Status: DC

## 2019-12-23 NOTE — Progress Notes (Signed)
OFFICE VISIT  12/23/2019   CC:  Chief Complaint  Patient presents with  . Follow-up    RCI, pt is not fasting   HPI:    Patient is a 79 y.o. Caucasian female who presents for f/u HTN, chronic neuropathic pain. I last saw her for virtual visit 10/16/19 for diarrhea.  I felt this was functional, with lower suspicion of malabsorption. Last routine chronic illness f/u was 06/2019. She has chronic stuggles with depression and anxiety.  Went to ED 11/15/19 for depression and recent suicidal thoughts, some recent use of alcohol to cope and she said the alc only made her depression worse. She was assessed by [facility] and was deemed ok for outpt mgmt and was started on cymbalta 20mg  qd for treatment of MDD and neuropathic pain, and trazodone 50mg  qhs for sleep. I reviewed all notes and labs: CBC and CMET normal.  Her DM/HLD/Hypothyroidism are followed/managed by endocrinology.  INTERIM HX: Feeling better. Says she thinks her poor sleep is a big contributor to her mood issues.  She also noted today that her ex-husband and her dog had diet w/in a month or two prior to her going to ED.  She is not clear on when she stopped taking fluoxetine. Doing fine on cymbalta. Still drinking 1-2 mixed drinks nightly.  Says wine causes her too much impairment. She does still get counseling at [facility].  She continues to decline IOP. Taking trazodone 50mg .  Still taking gabapentin 400mg , 1 daytime and 2 hs. Tolerating all meds fine. Pain: doing much better since dry needling and PT.  BP: not monitoring at home.    Acute DVT 2020 Dr. Lindi Adie took her off her anticoagulant 3 mo ago b/c she completed 6 mo of this.  ROS: no fevers, no CP, no SOB, no wheezing, no cough, no dizziness, no HAs, no rashes, no melena/hematochezia.  No polyuria or polydipsia.  No myalgias or arthralgias.  No focal weakness, paresthesias, or tremors.  No acute vision or hearing abnormalities. No n/v/d or abd pain.  No palpitations.      Past Medical History:  Diagnosis Date  . Acute DVT (deep venous thrombosis) ([place]) 04/23/2019   Right popliteal  . Anxiety and depression 1964   oncologist started duloxetine 12/2016  . Breast cancer ([place]) 03/14/2016   Clinical stage 2A: (triple neg): Right breast, upper inner quadrant, 03/2016.  Neoadjuvant chemo x 5 cycles,lumpectomy 4 mo later, then RT started 10/2016.  Adjuvant Xeloda [id number].  SWOG research trial pt 04/2017--pt randomized to pembrolizumab immunotherapy.  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  [name] Kitchen Cataracts, bilateral 07/2017  . 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 Depression 1964   Patient states since age 55  . Diabetes mellitus with complication ([place]) 3244   managed by endocrinology.  A1c Mar 12, 2018 was 7.0% at Dr. Shirlyn Goltz.   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  Excision done 02/2019  . GERD (gastroesophageal reflux disease) 2013  . Hyperlipidemia 1986  . Hypertension 2008  . Hypothyroidism 1988   Diagnosed in her 70s.  Managed by Endocrinologist  . Lumbar radiculopathy 04/2019   Dr. Tamala Julian to get plain films of LB and hip (considering MRI due to her hx of cancer)  . Osteoporosis 2015   pt states "osteopenia", but then says that she refused to  take the rx med for this condition, so I suspect she had osteoporosis.  . Peripheral neuropathy 2017   Patient states diabetic neuropathy in feet prior to starting chemotherapy and then worsened by chemo.   [name] Kitchen TIA (transient ischemic attack) 03/26/2011    Past Surgical History:  Procedure Laterality Date  . ABDOMINAL HYSTERECTOMY  1972  . APPENDECTOMY  1972  . BREAST ENHANCEMENT SURGERY  1982  . BREAST IMPLANT REMOVAL Right 09/13/2016   Procedure: REMOVAL RIGHT BREAST IMPLANT;  Surgeon: Irene Limbo, MD;  Location: [facility];   Service: Plastics;  Laterality: Right;  . BREAST LUMPECTOMY WITH RADIOACTIVE SEED AND SENTINEL LYMPH NODE BIOPSY Right 09/13/2016   Procedure: RIGHT BREAST LUMPECTOMY WITH RADIOACTIVE SEED X 2 AND SENTINEL LYMPH NODE BIOPSY;  Surgeon: Alphonsa Overall, MD;  Location: [facility];  Service: General;  Laterality: Right;  . BREAST SURGERY Right 03/14/2016   Biopsy  . CAPSULECTOMY Right 09/13/2016   Procedure: RIGHT CAPSULECTOMY;  Surgeon: Irene Limbo, MD;  Location: [facility];  Service: Plastics;  Laterality: Right;  . CATARACT EXTRACTION, BILATERAL Bilateral 08/10/17 right eye, 08/31/17 left eye  . MASS EXCISION Left 02/28/2018   Path: benign.  Procedure: EXCISIONLEFT MEDIAL THIGH MASS ERAS PATHWAY;  Surgeon: Erroll Luna, MD;  Location: [facility];  Service: General;  Laterality: Left;  . PORTACATH PLACEMENT N/A 03/15/2016   Procedure: INSERTION PORT-A-CATH WITH [place];  Surgeon: Alphonsa Overall, MD;  Location: [facility];  Service: General;  Laterality: N/A;  . PORTACATH REMOVAL  07/2017  . surgical repair left ankle Left 2009   s/p Fall   . TONSILLECTOMY AND ADENOIDECTOMY  1948   Age 87    Outpatient Medications Prior to Visit  Medication Sig Dispense Refill  . atorvastatin (LIPITOR) 80 MG tablet Take 80 mg by mouth every evening.     . cholecalciferol (VITAMIN D3) 25 MCG (1000 UNIT) tablet Take 1,000 Units by mouth daily.    [name] Kitchen levothyroxine (SYNTHROID) 112 MCG tablet 112 mcg daily before breakfast.     . lisinopril (ZESTRIL) 30 MG tablet Take 1 tablet (30 mg total) by mouth daily. 90 tablet 1  . metFORMIN (GLUCOPHAGE-XR) 500 MG 24 hr tablet Take 1,000 mg by mouth daily before supper.     . NONFORMULARY OR COMPOUNDED ITEM Estradiol vaginal cream 0.02% insert 1/4 of an applicator vaginally and thin layer vulva daily x 2 weeks.  Then 1/4 of an applicator vaginally and a thin layer on the vulva twice a week for long term (Patient taking differently: Apply 1  application topically See admin instructions. Use 1/4 of an applicator vaginally and a thin layer on the vulva twice a week.) 90 each 3  . Propylene Glycol (SYSTANE BALANCE OP) Place 1 drop into both eyes daily as needed (for dry eyes).    . DULoxetine (CYMBALTA) 20 MG capsule Take 1 capsule (20 mg total) by mouth daily. 30 capsule 0  . gabapentin (NEURONTIN) 300 MG capsule Take 300 mg by mouth See admin instructions. Takes 300 mg in the morning and 600 mg at bedtime.    . traZODone (DESYREL) 50 MG tablet Take 1 tablet (50 mg total) by mouth at bedtime. 30 tablet 0   No facility-administered medications prior to visit.    Allergies  Allergen Reactions  . Other Other (See Comments)    STEROIDS- emotional  . Penicillins Other (See Comments)    Unsure of reaction, was 79 years old  ROS As per HPI  PE: Vitals with BMI 12/23/2019 11/15/2019 11/15/2019  Height 5\' 7"  - -  Weight 167 lbs 3 oz - -  BMI 63.87 - -  Systolic 564 332 951  Diastolic 69 68 93  Pulse 56 - 55  O2 sat on RA today is 98%  Gen: Alert, well appearing.  Patient is oriented to person, place, time, and situation. AFFECT: pleasant, lucid thought and speech. CV: RRR, no m/r/g.   LUNGS: CTA bilat, nonlabored resps, good aeration in all lung fields. EXT: no clubbing or cyanosis.  no edema.    LABS:  Lab Results  Component Value Date   TSH 0.50 09/20/2019   Lab Results  Component Value Date   WBC 4.5 11/14/2019   HGB 12.7 11/14/2019   HCT 38.3 11/14/2019   MCV 98.5 11/14/2019   PLT 136 (L) 11/14/2019   Lab Results  Component Value Date   CREATININE 0.91 11/14/2019   BUN 22 11/14/2019   NA 138 11/14/2019   K 4.2 11/14/2019   CL 106 11/14/2019   CO2 25 11/14/2019   Lab Results  Component Value Date   ALT 37 11/14/2019   AST 30 11/14/2019   ALKPHOS 58 11/14/2019   BILITOT 1.4 (H) 11/14/2019   Lab Results  Component Value Date   CHOL 185 09/20/2019   No results found for: HDL Lab Results   Component Value Date   LDLCALC 98 09/20/2019   Lab Results  Component Value Date   HGBA1C 7.2 09/20/2019    IMPRESSION AND PLAN:  1) MDD, recurrent, in partial remission. Inc cymbalta to 30mg  qd. Continue counseling with [facility]. Limit alcohol/quit drinking would be best but pt says she feels like she's ok to continue the 1-2 drinks per night that she is currently doing.  2) Insomnia, chronic, secondary to psych and chronic pain. Waxing and waning problem.  No changes today. RF'd trazodone 50mg  qhs.  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 Stable.  RF'd gabapentin 400 mg, 1 qAM and 2 qhs. Cymbalta increased today to 30mg  qd.  4) Recent piriformis syndrome: much improved s/p dry needling and PT.  5) Right popliteal DVT: she finished 6 mo of xarelto.  6) HTN: The current medical regimen is effective;  continue present plan and medications. Encouraged pt to get bp cuff for home bp monitoring. Lytes/cr good last month at ED visit.  An After Visit Summary was printed and given to the patient.  FOLLOW UP: Return in about 6 weeks (around 02/03/2020) for f/u MDD.  Signed:  Crissie Sickles, MD           12/23/2019

## 2020-01-17 ENCOUNTER — Telehealth (INDEPENDENT_AMBULATORY_CARE_PROVIDER_SITE_OTHER): Payer: Medicare Other | Admitting: Family Medicine

## 2020-01-17 ENCOUNTER — Telehealth: Payer: Self-pay | Admitting: Family Medicine

## 2020-01-17 ENCOUNTER — Encounter: Payer: Self-pay | Admitting: Family Medicine

## 2020-01-17 ENCOUNTER — Other Ambulatory Visit: Payer: Self-pay

## 2020-01-17 VITALS — Wt 167.0 lb

## 2020-01-17 DIAGNOSIS — G4489 Other headache syndrome: Secondary | ICD-10-CM | POA: Diagnosis not present

## 2020-01-17 DIAGNOSIS — R5381 Other malaise: Secondary | ICD-10-CM | POA: Diagnosis not present

## 2020-01-17 DIAGNOSIS — R5383 Other fatigue: Secondary | ICD-10-CM

## 2020-01-17 DIAGNOSIS — R5081 Fever presenting with conditions classified elsewhere: Secondary | ICD-10-CM

## 2020-01-17 MED ORDER — DOXYCYCLINE HYCLATE 100 MG PO CAPS
100.0000 mg | ORAL_CAPSULE | Freq: Two times a day (BID) | ORAL | 0 refills | Status: DC
Start: 2020-01-17 — End: 2020-06-02

## 2020-01-17 NOTE — Telephone Encounter (Addendum)
Patient stated symptoms started last night but unsure if she has anything contagious and does not want to spread anything to co-workers. Virtual appt made for 4:15 to further discuss with PCP unless able to provide any other suggestions?

## 2020-01-17 NOTE — Telephone Encounter (Signed)
Patient is calling in and states that she had a fever of 101 and a major headache. Took some motrin to which helped the fever break but is still dealing with the headache and chills. Wanted to know if she needed to go get tested.

## 2020-01-17 NOTE — Telephone Encounter (Signed)
Ok to keep 4:15 virtual

## 2020-01-17 NOTE — Progress Notes (Signed)
Virtual Visit via Video Note  I connected with pt on 01/17/20 at  4:00 PM EDT by a video enabled telemedicine application and verified that I am speaking with the correct person using two identifiers.  Location patient: home Location provider:work or home office Persons participating in the virtual visit: patient, provider  I discussed the limitations of evaluation and management by telemedicine and the availability of in person appointments. The patient expressed understanding and agreed to proceed.  Telemedicine visit is a necessity given the COVID-19 restrictions in place at the current time.  HPI: 79 y/o WF being seen today for fever, chills, headache. Middle of last night got chills, temp was 101, took ibup and it brought temp back to normal. Bad HA today, persisting despite 400 mg ibup q6h.  Some nausea but no vomiting. Sense of taste and smell are normal.  No nasal cong/pnd, no cough, no ST. No urinary sx's.  No diarrhea.  No rash.  NO abd pain. A little achy diffusely, + malaise.  No neck pain or neck stiffness. No known recent tick bite.  No known sick contacts.   ROS: See pertinent positives and negatives per HPI.  Past Medical History:  Diagnosis Date  . Acute DVT (deep venous thrombosis) ([place]) 04/23/2019   Right popliteal  . Anxiety and depression 1964   oncologist started duloxetine 12/2016  . Breast cancer ([place]) 03/14/2016   Clinical stage 2A: (triple neg): Right breast, upper inner quadrant, 03/2016.  Neoadjuvant chemo x 5 cycles,lumpectomy 4 mo later, then RT started 10/2016.  Adjuvant Xeloda [id number].  SWOG research trial pt 04/2017--pt randomized to pembrolizumab immunotherapy.  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  [name] Kitchen Cataracts, bilateral 07/2017  . 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 Depression 1964   Patient states since  age 56  . Diabetes mellitus with complication ([place]) 3474   managed by endocrinology.  A1c Mar 12, 2018 was 7.0% at Dr. Shirlyn Goltz.   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  Excision done 02/2019  . GERD (gastroesophageal reflux disease) 2013  . Hyperlipidemia 1986  . Hypertension 2008  . Hypothyroidism 1988   Diagnosed in her 32s.  Managed by Endocrinologist  . Lumbar radiculopathy 04/2019   Dr. Tamala Julian to get plain films of LB and hip (considering MRI due to her hx of cancer)  . Osteoporosis 2015   pt states "osteopenia", but then says that she refused to take the rx med for this condition, so I suspect she had osteoporosis.  . Peripheral neuropathy 2017   Patient states diabetic neuropathy in feet prior to starting chemotherapy and then worsened by chemo.   [name] Kitchen TIA (transient ischemic attack) 03/26/2011    Past Surgical History:  Procedure Laterality Date  . ABDOMINAL HYSTERECTOMY  1972  . APPENDECTOMY  1972  . BREAST ENHANCEMENT SURGERY  1982  . BREAST IMPLANT REMOVAL Right 09/13/2016   Procedure: REMOVAL RIGHT BREAST IMPLANT;  Surgeon: Irene Limbo, MD;  Location: [facility];  Service: Plastics;  Laterality: Right;  . BREAST LUMPECTOMY WITH RADIOACTIVE SEED AND SENTINEL LYMPH NODE BIOPSY Right 09/13/2016   Procedure: RIGHT BREAST LUMPECTOMY WITH RADIOACTIVE SEED X 2 AND SENTINEL LYMPH NODE BIOPSY;  Surgeon: Alphonsa Overall, MD;  Location: [facility];  Service: General;  Laterality: Right;  . BREAST SURGERY Right  03/14/2016   Biopsy  . CAPSULECTOMY Right 09/13/2016   Procedure: RIGHT CAPSULECTOMY;  Surgeon: Irene Limbo, MD;  Location: [facility];  Service: Plastics;  Laterality: Right;  . CATARACT EXTRACTION, BILATERAL Bilateral 08/10/17 right eye, 08/31/17 left eye  . MASS EXCISION Left 02/28/2018   Path: benign.  Procedure: EXCISIONLEFT MEDIAL THIGH MASS ERAS PATHWAY;  Surgeon: Erroll Luna, MD;  Location: [facility];  Service: General;  Laterality: Left;  . PORTACATH PLACEMENT N/A 03/15/2016   Procedure: INSERTION PORT-A-CATH WITH [place];  Surgeon: Alphonsa Overall, MD;  [facility];  Service: General;  Laterality: N/A;  . PORTACATH REMOVAL  07/2017  . surgical repair left ankle Left 2009   s/p Fall   . TONSILLECTOMY AND ADENOIDECTOMY  1948   Age 24    Family History  Problem Relation Age of Onset  . Stroke Mother   . Suicidality Father   . Stroke Brother   . Stroke Son     Current Outpatient Medications:  .  atorvastatin (LIPITOR) 80 MG tablet, Take 80 mg by mouth every evening. , Disp: , Rfl:  .  cholecalciferol (VITAMIN D3) 25 MCG (1000 UNIT) tablet, Take 1,000 Units by mouth daily., Disp: , Rfl:  .  DULoxetine (CYMBALTA) 30 MG capsule, Take 1 capsule (30 mg total) by mouth daily., Disp: 30 capsule, Rfl: 1 .  gabapentin (NEURONTIN) 300 MG capsule, Take 1 capsule (300 mg total) by mouth See admin instructions. Takes 300 mg in the morning and 600 mg at bedtime., Disp: 270 capsule, Rfl: 3 .  levothyroxine (SYNTHROID) 112 MCG tablet, 112 mcg daily before breakfast. , Disp: , Rfl:  .  lisinopril (ZESTRIL) 30 MG tablet, Take 1 tablet (30 mg total) by mouth daily., Disp: 90 tablet, Rfl: 1 .  metFORMIN (GLUCOPHAGE-XR) 500 MG 24 hr tablet, Take 1,000 mg by mouth daily before supper. , Disp: , Rfl:  .  NONFORMULARY OR COMPOUNDED ITEM, Estradiol vaginal cream 0.02% insert 1/4 of an applicator vaginally and thin layer vulva daily x 2 weeks.  Then 1/4 of an applicator vaginally and a thin layer on the vulva twice a week for long term (Patient taking differently: Apply 1 application topically See admin instructions. Use 1/4 of an applicator vaginally and a thin layer on the vulva twice a week.), Disp: 90 each, Rfl: 3 .  Propylene Glycol (SYSTANE BALANCE OP), Place 1 drop into both eyes daily as needed (for dry eyes)., Disp: , Rfl:  .  traZODone (DESYREL) 50 MG tablet, Take 1 tablet (50 mg total)  by mouth at bedtime., Disp: 90 tablet, Rfl: 3 .  doxycycline (VIBRAMYCIN) 100 MG capsule, Take 1 capsule (100 mg total) by mouth 2 (two) times daily for 10 days., Disp: 20 capsule, Rfl: 0  EXAM:  VITALS per patient if applicable: Wt 167 lb (75.8 kg)   BMI 26.16 kg/m    GENERAL: alert, oriented, appears well and in no acute distress  HEENT: atraumatic, conjunttiva clear, no obvious abnormalities on inspection of external nose and ears  NECK: normal movements of the head and neck  LUNGS: on inspection no signs of respiratory distress, breathing rate appears normal, no obvious gross SOB, gasping or wheezing  CV: no obvious cyanosis  MS: moves all visible extremities without noticeable abnormality  PSYCH/NEURO: pleasant and cooperative, no obvious depression or anxiety, speech and thought processing grossly intact  LABS: none today    Chemistry      Component Value Date/Time  NA 138 11/14/2019 1720   NA 136 05/01/2017 0914   K 4.2 11/14/2019 1720   K 4.5 05/01/2017 0914   CL 106 11/14/2019 1720   CO2 25 11/14/2019 1720   CO2 24 05/01/2017 0914   BUN 22 11/14/2019 1720   BUN 14.9 05/01/2017 0914   CREATININE 0.91 11/14/2019 1720   CREATININE 1.01 (H) 10/22/2019 1121   CREATININE 0.8 05/01/2017 0914   GLU 181 09/20/2019 0000      Component Value Date/Time   CALCIUM 9.2 11/14/2019 1720   CALCIUM 9.6 05/01/2017 0914   ALKPHOS 58 11/14/2019 1720   ALKPHOS 75 05/01/2017 0914   AST 30 11/14/2019 1720   AST 27 10/22/2019 1121   AST 51 (H) 05/01/2017 0914   ALT 37 11/14/2019 1720   ALT 34 10/22/2019 1121   ALT 62 (H) 05/01/2017 0914   BILITOT 1.4 (H) 11/14/2019 1720   BILITOT 1.6 (H) 10/22/2019 1121   BILITOT 1.30 (H) 05/01/2017 0914      ASSESSMENT AND PLAN:  Discussed the following assessment and plan:  Acute fever with signif HA, generalized malaise/aches. Empiric doxy 100mg  bid x 10d. Less suspicious of viral/bact meningitis or covid 19 infection. I gave  clear instructions about watching for persistence of fevers, worsening of HA, onset of neck pain and/or neck stiffness, rash, n/v/d, or SOB.    -we discussed possible serious and likely etiologies, options for evaluation and workup, limitations of telemedicine visit vs in person visit, treatment, treatment risks and precautions. Pt prefers to treat via telemedicine empirically rather then risking or undertaking an in person visit at this moment. Patient agrees to seek prompt in person care if worsening, new symptoms arise, or if is not improving with treatment.   I discussed the assessment and treatment plan with the patient. The patient was provided an opportunity to ask questions and all were answered. The patient agreed with the plan and demonstrated an understanding of the instructions.   The patient was advised to call back or seek an in-person evaluation if the symptoms worsen or if the condition fails to improve as anticipated.  F/u: if not improving appropriately  Signed:  Crissie Sickles, MD           01/17/2020

## 2020-01-23 ENCOUNTER — Other Ambulatory Visit: Payer: Self-pay | Admitting: Family Medicine

## 2020-02-12 ENCOUNTER — Telehealth: Payer: Self-pay

## 2020-02-12 ENCOUNTER — Other Ambulatory Visit: Payer: Self-pay

## 2020-02-12 MED ORDER — DULOXETINE HCL 30 MG PO CPEP: 30.0000 mg | ORAL_CAPSULE | Freq: Every day | ORAL | 0 refills | Status: DC

## 2020-02-12 NOTE — Telephone Encounter (Signed)
30 day supply sent, patient made aware.

## 2020-02-12 NOTE — Telephone Encounter (Signed)
Patient is out of Cymbalta. Pharmacy is advising that Rx denied. Patient has an appointment tomorrow.

## 2020-02-13 ENCOUNTER — Other Ambulatory Visit: Payer: Self-pay

## 2020-02-13 ENCOUNTER — Encounter: Payer: Self-pay | Admitting: Family Medicine

## 2020-02-13 ENCOUNTER — Telehealth (INDEPENDENT_AMBULATORY_CARE_PROVIDER_SITE_OTHER): Payer: Medicare Other | Admitting: Family Medicine

## 2020-02-13 DIAGNOSIS — F3341 Major depressive disorder, recurrent, in partial remission: Secondary | ICD-10-CM

## 2020-02-13 NOTE — Progress Notes (Signed)
Virtual Visit via Video Note  I connected with pt on 02/13/20 at  2:30 PM EDT by a video enabled telemedicine application and verified that I am speaking with the correct person using two identifiers.  Location patient: home Location provider:work or home office Persons participating in the virtual visit: patient, provider  I discussed the limitations of evaluation and management by telemedicine and the availability of in person appointments. The patient expressed understanding and agreed to proceed.   HPI: 79 y/o WF being seen today for 6 wk f/u recurrent MDD and insomnia, with hx of ETOH abuse. A/P as of last visit: "1) MDD, recurrent, in partial remission. Inc cymbalta to 30mg  qd. Continue counseling with [facility]. Limit alcohol/quit drinking would be best but pt says she feels like she's ok to continue the 1-2 drinks per night that she is currently doing.  2) Insomnia, chronic, secondary to psych and chronic pain. Waxing and waning problem.  No changes today. RF'd trazodone 50mg  qhs.  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 Stable.  RF'd gabapentin 400 mg, 1 qAM and 2 qhs. Cymbalta increased today to 30mg  qd.  4) Recent piriformis syndrome: much improved s/p dry needling and PT.  5) Right popliteal DVT: she finished 6 mo of xarelto.  6) HTN: The current medical regimen is effective;  continue present plan and medications. Encouraged pt to get bp cuff for home bp monitoring. Lytes/cr good last month at ED visit."  INTERIM HX: Feeling pretty well. Taking 30mg  cymbalta qd.  Feeling slight gradual improvement although she adds that life stresses "don't go away". Sleep is adequate taking gabapentin, sometimes adds trazodone. Still seeing [facility] counselor.  Considering IOP soon. Still having 2 mixed drinks per night.  Never getting drunk lately. Still working and functioning ok.  Going on a retreat with other women --[facility]. Appetite is very good. Exercise: walking inhibited recently due to heat, looking into joining health club.     ROS: See pertinent positives and negatives per HPI.  Past Medical History:  Diagnosis Date  . Acute DVT (deep venous thrombosis) ([place]) 04/23/2019   Right popliteal  . Anxiety and depression 1964   oncologist started duloxetine 12/2016  . Breast cancer ([place]) 03/14/2016   Clinical stage 2A: (triple neg): Right breast, upper inner quadrant, 03/2016.  Neoadjuvant chemo x 5 cycles,lumpectomy 4 mo later, then RT started 10/2016.  Adjuvant Xeloda [id number].  SWOG research trial pt 04/2017--pt randomized to pembrolizumab immunotherapy.  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  [name] Kitchen Cataracts, bilateral 07/2017  . 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 Depression 1964   Patient states since age 24  . Diabetes mellitus with complication ([place]) 4431   managed by endocrinology.  A1c Mar 12, 2018 was 7.0% at Dr. Shirlyn Goltz.   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  Excision done 02/2019  . GERD (gastroesophageal reflux disease) 2013  . Hyperlipidemia 1986  . Hypertension 2008  . Hypothyroidism 1988   Diagnosed in her 79s.  Managed by Endocrinologist  . Lumbar radiculopathy 04/2019   Dr. Tamala Julian to get plain films of LB and hip (considering MRI due to her hx of cancer)  . Osteoporosis 2015   pt states "osteopenia", but then says that she refused to take the rx med for this condition,  so I suspect she had osteoporosis.  . Peripheral neuropathy 2017   Patient states diabetic neuropathy in feet prior to starting chemotherapy and then worsened by chemo.   [name] Kitchen TIA (transient ischemic attack) 03/26/2011    Past Surgical History:  Procedure Laterality Date  . ABDOMINAL HYSTERECTOMY  1972  . APPENDECTOMY  1972  . BREAST  ENHANCEMENT SURGERY  1982  . BREAST IMPLANT REMOVAL Right 09/13/2016   Procedure: REMOVAL RIGHT BREAST IMPLANT;  Surgeon: Irene Limbo, MD;  Location: [place];  Service: Plastics;  Laterality: Right;  . BREAST LUMPECTOMY WITH RADIOACTIVE SEED AND SENTINEL LYMPH NODE BIOPSY Right 09/13/2016   Procedure: RIGHT BREAST LUMPECTOMY WITH RADIOACTIVE SEED X 2 AND SENTINEL LYMPH NODE BIOPSY;  Surgeon: Alphonsa Overall, MD;  Location: [place];  Service: General;  Laterality: Right;  . BREAST SURGERY Right 03/14/2016   Biopsy  . CAPSULECTOMY Right 09/13/2016   Procedure: RIGHT CAPSULECTOMY;  Surgeon: Irene Limbo, MD;  Location: [place];  Service: Plastics;  Laterality: Right;  . CATARACT EXTRACTION, BILATERAL Bilateral 08/10/17 right eye, 08/31/17 left eye  . MASS EXCISION Left 02/28/2018   Path: benign.  Procedure: EXCISIONLEFT MEDIAL THIGH MASS ERAS PATHWAY;  Surgeon: Erroll Luna, MD;  Location: [place];  Service: General;  Laterality: Left;  . PORTACATH PLACEMENT N/A 03/15/2016   Procedure: INSERTION PORT-A-CATH WITH [place];  Surgeon: Alphonsa Overall, MD;  Location: [facility];  Service: General;  Laterality: N/A;  . PORTACATH REMOVAL  07/2017  . surgical repair left ankle Left 2009   s/p Fall   . TONSILLECTOMY AND ADENOIDECTOMY  1948   Age 57    Family History  Problem Relation Age of Onset  . Stroke Mother   . Suicidality Father   . Stroke Brother   . Stroke Son       Current Outpatient Medications:  .  atorvastatin (LIPITOR) 80 MG tablet, Take 80 mg by mouth every evening. , Disp: , Rfl:  .  cholecalciferol (VITAMIN D3) 25 MCG (1000 UNIT) tablet, Take 1,000 Units by mouth daily., Disp: , Rfl:  .  DULoxetine (CYMBALTA) 30 MG capsule, Take 1 capsule (30 mg total) by mouth daily., Disp: 30 capsule, Rfl: 0 .  gabapentin (NEURONTIN) 300 MG capsule, Take 1 capsule (300 mg total) by mouth See admin instructions. Takes 300 mg in the  morning and 600 mg at bedtime., Disp: 270 capsule, Rfl: 3 .  levothyroxine (SYNTHROID) 112 MCG tablet, 112 mcg daily before breakfast. , Disp: , Rfl:  .  lisinopril (ZESTRIL) 30 MG tablet, Take 1 tablet (30 mg total) by mouth daily., Disp: 90 tablet, Rfl: 1 .  metFORMIN (GLUCOPHAGE-XR) 500 MG 24 hr tablet, Take 1,000 mg by mouth daily before supper. , Disp: , Rfl:  .  NONFORMULARY OR COMPOUNDED ITEM, Estradiol vaginal cream 0.02% insert 1/4 of an applicator vaginally and thin layer vulva daily x 2 weeks.  Then 1/4 of an applicator vaginally and a thin layer on the vulva twice a week for long term (Patient taking differently: Apply 1 application topically See admin instructions. Use 1/4 of an applicator vaginally and a thin layer on the vulva twice a week.), Disp: 90 each, Rfl: 3 .  Propylene Glycol (SYSTANE BALANCE OP), Place 1 drop into both eyes daily as needed (for dry eyes)., Disp: , Rfl:  .  traZODone (DESYREL) 50 MG tablet, Take 1 tablet (50 mg total) by mouth at bedtime., Disp: 90 tablet, Rfl: 3  EXAM:  VITALS per patient if applicable:  Vitals with BMI 01/17/2020 12/23/2019 11/15/2019  Height - 5\' 7"  -  Weight 167 lbs 167 lbs 3 oz -  BMI 83.66 29.47 -  Systolic - 654 650  Diastolic - 69 68  Pulse - 56 -     GENERAL: alert, oriented, appears well and in no acute distress  HEENT: atraumatic, conjunttiva clear, no obvious abnormalities on inspection of external nose and ears  NECK: normal movements of the head and neck  LUNGS: on inspection no signs of respiratory distress, breathing rate appears normal, no obvious gross SOB, gasping or wheezing  CV: no obvious cyanosis  MS: moves all visible extremities without noticeable abnormality  PSYCH/NEURO: pleasant and cooperative, no obvious depression or anxiety, speech and thought processing grossly intact    Chemistry      Component Value Date/Time   NA 138 11/14/2019 1720   NA 136 05/01/2017 0914   K 4.2 11/14/2019 1720   K  4.5 05/01/2017 0914   CL 106 11/14/2019 1720   CO2 25 11/14/2019 1720   CO2 24 05/01/2017 0914   BUN 22 11/14/2019 1720   BUN 14.9 05/01/2017 0914   CREATININE 0.91 11/14/2019 1720   CREATININE 1.01 (H) 10/22/2019 1121   CREATININE 0.8 05/01/2017 0914   GLU 181 09/20/2019 0000      Component Value Date/Time   CALCIUM 9.2 11/14/2019 1720   CALCIUM 9.6 05/01/2017 0914   ALKPHOS 58 11/14/2019 1720   ALKPHOS 75 05/01/2017 0914   AST 30 11/14/2019 1720   AST 27 10/22/2019 1121   AST 51 (H) 05/01/2017 0914   ALT 37 11/14/2019 1720   ALT 34 10/22/2019 1121   ALT 62 (H) 05/01/2017 0914   BILITOT 1.4 (H) 11/14/2019 1720   BILITOT 1.6 (H) 10/22/2019 1121   BILITOT 1.30 (H) 05/01/2017 0914       Chemistry      Component Value Date/Time   NA 138 11/14/2019 1720   NA 136 05/01/2017 0914   K 4.2 11/14/2019 1720   K 4.5 05/01/2017 0914   CL 106 11/14/2019 1720   CO2 25 11/14/2019 1720   CO2 24 05/01/2017 0914   BUN 22 11/14/2019 1720   BUN 14.9 05/01/2017 0914   CREATININE 0.91 11/14/2019 1720   CREATININE 1.01 (H) 10/22/2019 1121   CREATININE 0.8 05/01/2017 0914   GLU 181 09/20/2019 0000      Component Value Date/Time   CALCIUM 9.2 11/14/2019 1720   CALCIUM 9.6 05/01/2017 0914   ALKPHOS 58 11/14/2019 1720   ALKPHOS 75 05/01/2017 0914   AST 30 11/14/2019 1720   AST 27 10/22/2019 1121   AST 51 (H) 05/01/2017 0914   ALT 37 11/14/2019 1720   ALT 34 10/22/2019 1121   ALT 62 (H) 05/01/2017 0914   BILITOT 1.4 (H) 11/14/2019 1720   BILITOT 1.6 (H) 10/22/2019 1121   BILITOT 1.30 (H) 05/01/2017 0914     Lab Results  Component Value Date   CHOL 185 09/20/2019   LDLCALC 98 09/20/2019   Lab Results  Component Value Date   WBC 4.5 11/14/2019   HGB 12.7 11/14/2019   HCT 38.3 11/14/2019   MCV 98.5 11/14/2019   PLT 136 (L) 11/14/2019    ASSESSMENT AND PLAN:  Discussed the following assessment and plan:  Recurrent/chronic MDD, improved a little on cymbalta 30mg  for the  last 6 wks. INsomnia: sleep is good now on gabapentin 600 mg qhs, also adds trazodone  50mg  on some nights.  She is happy with this. We discussed option of keeping everything the same today vs increase cymbalta to 60mg  qd. She prefers to wait 1 more month before deciding whether or not to change her dose. At the time of next RF request she will let me know about her decision.   In the meantime she can call or come in at any time. At next f/u in 3 mo we'll also repeat CMET and FLP and check PT/INR.  -we discussed possible serious and likely etiologies, options for evaluation and workup, limitations of telemedicine visit vs in person visit, treatment, treatment risks and precautions. Pt prefers to treat via telemedicine empirically rather then risking or undertaking an in person visit at this moment.  Work/School slipped offered: Advised to seek prompt follow up telemedicine visit or in person care if worsening, new symptoms arise, or if is not improving with treatment. Did let her know that I only do telemedicine on Tuesdays and Thursdays for [place] and advised follow up visit with PCP or [facility] if needs follow up or if any further questions arise to avoid any delays.   I discussed the assessment and treatment plan with the patient. The patient was provided an opportunity to ask questions and all were answered. The patient agreed with the plan and demonstrated an understanding of the instructions.   The patient was advised to call back or seek an in-person evaluation if the symptoms worsen or if the condition fails to improve as anticipated.  F/u: 3 mo  Signed:  Crissie Sickles, MD           02/13/2020

## 2020-03-21 ENCOUNTER — Other Ambulatory Visit: Payer: Self-pay | Admitting: Family Medicine

## 2020-03-23 DIAGNOSIS — E1165 Type 2 diabetes mellitus with hyperglycemia: Secondary | ICD-10-CM | POA: Diagnosis not present

## 2020-03-23 DIAGNOSIS — E785 Hyperlipidemia, unspecified: Secondary | ICD-10-CM | POA: Diagnosis not present

## 2020-03-23 DIAGNOSIS — E1169 Type 2 diabetes mellitus with other specified complication: Secondary | ICD-10-CM | POA: Diagnosis not present

## 2020-03-23 DIAGNOSIS — E039 Hypothyroidism, unspecified: Secondary | ICD-10-CM | POA: Diagnosis not present

## 2020-03-23 DIAGNOSIS — E1140 Type 2 diabetes mellitus with diabetic neuropathy, unspecified: Secondary | ICD-10-CM | POA: Diagnosis not present

## 2020-03-23 LAB — TSH: TSH: 2.33 (ref <=5.90)

## 2020-03-23 LAB — HEMOGLOBIN A1C: Hemoglobin A1C: 7.4

## 2020-03-23 LAB — LIPID PANEL
Cholesterol: 195 (ref 0–200)
LDL Cholesterol: 92

## 2020-03-26 DIAGNOSIS — E1169 Type 2 diabetes mellitus with other specified complication: Secondary | ICD-10-CM | POA: Diagnosis not present

## 2020-03-26 DIAGNOSIS — E039 Hypothyroidism, unspecified: Secondary | ICD-10-CM | POA: Diagnosis not present

## 2020-03-26 DIAGNOSIS — E785 Hyperlipidemia, unspecified: Secondary | ICD-10-CM | POA: Diagnosis not present

## 2020-03-26 DIAGNOSIS — E1165 Type 2 diabetes mellitus with hyperglycemia: Secondary | ICD-10-CM | POA: Diagnosis not present

## 2020-03-26 DIAGNOSIS — E1140 Type 2 diabetes mellitus with diabetic neuropathy, unspecified: Secondary | ICD-10-CM | POA: Diagnosis not present

## 2020-04-22 ENCOUNTER — Other Ambulatory Visit: Payer: Self-pay | Admitting: *Deleted

## 2020-04-22 DIAGNOSIS — Z171 Estrogen receptor negative status [ER-]: Secondary | ICD-10-CM

## 2020-04-22 DIAGNOSIS — C50211 Malignant neoplasm of upper-inner quadrant of right female breast: Secondary | ICD-10-CM

## 2020-04-26 NOTE — Assessment & Plan Note (Deleted)
Peroneal vein DVT: Unable to start on Xarelto because of insurance issues. We were able to obtain a first month voucher for Xarelto to be provided at the [employer]. She will hear from her insurance company tomorrow. After the first 3 weeks, she can go to 20 mg daily.  Breast cancer of upper-inner quadrant of right female breast ([place]) 02/29/2016: Right breast palpable mass (with silicone implants 6433), 3.5 cm on MRI, additional 3 cm anterior linear enhancement (biopsy 03/14/2016 IDC grade 3); 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  T2 N0 stage 2A clinical stage  Treatment summary: 1. Neoadjuvant chemotherapy with dose dense Adriamycin and Cytoxan 4 followed by Abraxane weekly 5(stopped early due to neuropathy) 2. 09/13/2016: Right lumpectomy: IDC grade 3, 2 foci, 2 cm and 1.1 cm, 0/3 lymph nodes negative margins negative, ER 0%, PR 0%, HER-2 negative ratio 1.02, Ki-67 60%, RCB-II; ypT2ypN0 Stage 2A (with plastic surgery removing the ruptured implant) 3. Followed by radiation therapy completed 12/06/2016 4.Adjuvant Xeloda 1000 mg by mouth twice a day 2 weeks on one week off 12/30/2016-Oct 2018 5.SWOG 1418 clinical trial Pembrolizumabcycle 1 given 05/03/2017(patient decided to discontinue clinical trial) ----------------------------------------------------------------------------------------------------------------------------------------- Surveillance: 1.Breast exam11/18/2020: Benign 2.mammogram11/3/2020at [facility]: No evidence of malignancy. Breast density categoryB  Chronic fatigue Chemo-induced peripheral neuropathy: Monitoring Return to clinic in 1 year with labs and follow-up

## 2020-04-27 ENCOUNTER — Telehealth: Payer: Self-pay | Admitting: *Deleted

## 2020-04-27 ENCOUNTER — Telehealth: Payer: Self-pay | Admitting: Hematology and Oncology

## 2020-04-27 ENCOUNTER — Inpatient Hospital Stay: Payer: BLUE CROSS/BLUE SHIELD

## 2020-04-27 ENCOUNTER — Inpatient Hospital Stay: Payer: BLUE CROSS/BLUE SHIELD | Admitting: Hematology and Oncology

## 2020-04-27 DIAGNOSIS — C50211 Malignant neoplasm of upper-inner quadrant of right female breast: Secondary | ICD-10-CM

## 2020-04-27 NOTE — Telephone Encounter (Signed)
R/s appt per sch msg. Called and spoke with patient. Confirmed new date and time

## 2020-04-27 NOTE — Telephone Encounter (Signed)
Q2449 Study;  Called patient to check on her since she did not show for her scheduled appointment this morning.  Patient states she call scheduling earlier today to reschedule due to "stomach issues."  Patient reports upset stomach possibly related to Metformin.  Patient states ok to reschedule her anytime in the next few weeks.  The window for this study visit closes on 12/24.  Informed patient research will send message to reschedule appointment in the next few weeks and they will call her with new date/time.  Patient verbalized understanding.  Foye Spurling, BSN, RN Clinical Research Nurse 04/27/2020 11:30 AM

## 2020-05-04 ENCOUNTER — Other Ambulatory Visit: Payer: Self-pay | Admitting: Family Medicine

## 2020-05-10 NOTE — Progress Notes (Signed)
Patient Care Team: Tammi Sou, MD as PCP - General (Family Medicine) Alphonsa Overall, MD as Consulting Physician (General Surgery) Nicholas Lose, MD as Consulting Physician (Hematology and Oncology) Kyung Rudd, MD as Consulting Physician (Radiation Oncology) Irene Limbo, MD as Consulting Physician (Plastic Surgery) Gerome Apley, MD as Consulting Physician (Endocrinology) Gardenia Phlegm, NP as Nurse Practitioner (Hematology and Oncology) Princess Bruins, MD as Consulting Physician (Obstetrics and Gynecology)  DIAGNOSIS:    ICD-10-CM   1. Malignant neoplasm of upper-inner quadrant of right breast in female, estrogen receptor negative ([place])  C50.211    Z17.1     SUMMARY OF ONCOLOGIC HISTORY: Oncology History  Breast cancer of upper-inner quadrant of right female breast ([place])  02/29/2016 Initial Diagnosis   Right breast palpable mass (with silicone implants 9735), 3.5 cm on MRI, additional 3 cm anterior linear enhancement? 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 Ki-67 60%, T2 N0 stage 2A clinical stage   03/14/2016 Procedure   Right breast biopsy upper inner quadrant: IDC grade 3   03/28/2016 -  Neo-Adjuvant Chemotherapy   Neoadjuvant chemotherapy with dose dense Adriamycin and Cytoxan followed by Abraxane weekly 5 ( patient is diabetic and cannot take steroids)   07/15/2016 Breast MRI   Right breast: Spiculated mass 1.5 cm significantly smaller compared to prior,NME previously seen is not noted, no abnormal lymph nodes   09/13/2016 Surgery   Removal of the silicone implant due to intracapsular rupture and capsulectomy (Dr.Thimappa)   09/13/2016 Surgery   Right lumpectomy: IDC grade 3, 2 foci, 2 cm and 1.1 cm, 0/3 lymph nodes negative margins negative, ER 0%, PR 0%, HER-2 negative ratio 1.02, Ki-67 60%, RCB-II; ypT2ypN0 Stage 2A    10/20/2016 - 12/06/2016 Radiation Therapy   Adjuvant radiation therapy   12/30/2016 - 04/05/2017 Chemotherapy   Xeloda  1000 mg by mouth twice a day adjuvant therapy x 4 cycles    05/03/2017 - 05/24/2017 Chemotherapy   SWOG S 1418 Pembrolizumab on clinical trial stopped after 2 doses by patient preference (not due to toxicities .)     CHIEF COMPLIANT: Follow-up of right breast cancer and DVT on Xarelto   INTERVAL HISTORY: Marissa Hall is a 79 y.o. with above-mentioned history of right breast cancer treated with neoadjuvant chemotherapy, lumpectomy, radiation, and is currently on surveillance. 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 She presents to the clinic today for follow-up.   She tells me that she is working on her blood sugars.  She denies any lumps or nodules in the breast.  ALLERGIES:  is allergic to other and penicillins.  MEDICATIONS:  Current Outpatient Medications  Medication Sig Dispense Refill  . atorvastatin (LIPITOR) 80 MG tablet Take 80 mg by mouth every evening.     . cholecalciferol (VITAMIN D3) 25 MCG (1000 UNIT) tablet Take 1,000 Units by mouth daily.    . DULoxetine (CYMBALTA) 30 MG capsule TAKE ONE CAPSULE BY MOUTH DAILY 30 capsule 1  . gabapentin (NEURONTIN) 300 MG capsule Take 1 capsule (300 mg total) by mouth See admin instructions. Takes 300 mg in the morning and 600 mg at bedtime. 270 capsule 3  . levothyroxine (SYNTHROID) 112 MCG tablet 112 mcg daily before breakfast.     . lisinopril (ZESTRIL) 30 MG tablet Take 1 tablet (30 mg total) by mouth daily. 90 tablet 1  . metFORMIN (GLUCOPHAGE-XR) 500 MG 24 hr tablet Take 1,000 mg by mouth daily before supper.     . NONFORMULARY OR  COMPOUNDED ITEM Estradiol vaginal cream 0.02% insert 1/4 of an applicator vaginally and thin layer vulva daily x 2 weeks.  Then 1/4 of an applicator vaginally and a thin layer on the vulva twice a week for long term (Patient taking differently: Apply 1 application topically See admin instructions. Use 1/4 of an applicator vaginally and a thin layer on the vulva twice a week.) 90 each 3  .  Propylene Glycol (SYSTANE BALANCE OP) Place 1 drop into both eyes daily as needed (for dry eyes).    . traZODone (DESYREL) 50 MG tablet Take 1 tablet (50 mg total) by mouth at bedtime. 90 tablet 3   No current facility-administered medications for this visit.    PHYSICAL EXAMINATION: ECOG PERFORMANCE STATUS: 1 - Symptomatic but completely ambulatory  There were no vitals filed for this visit. There were no vitals filed for this visit.  BREAST: No palpable masses or nodules in either right or left breasts. No palpable axillary supraclavicular or infraclavicular adenopathy no breast tenderness or nipple discharge. (exam performed in the presence of a chaperone)  LABORATORY DATA:  I have reviewed the data as listed CMP Latest Ref Rng & Units 11/14/2019 10/22/2019 04/24/2019  Glucose 70 - 99 mg/dL 189(H) 194(H) 197(H)  BUN 8 - 23 mg/dL _0 Creatinine 0.44 - 1.00 mg/dL 0.91 1.01(H) 0.90  Sodium 135 - 145 mmol/L 138 140 139  Potassium 3.5 - 5.1 mmol/L 4.2 4.3 4.8  Chloride 98 - 111 mmol/L 106 102 101  CO2 22 - 32 mmol/L _1 Calcium 8.9 - 10.3 mg/dL 9.2 9.8 9.7  Total Protein 6.5 - 8.1 g/dL 7.5 7.2 7.9  Total Bilirubin 0.3 - 1.2 mg/dL 1.4(H) 1.6(H) 1.2  Alkaline Phos 38 - 126 U/L 58 65 62  AST 15 - 41 U/L 30 27 35  ALT 0 - 44 U/L 37 34 40    Lab Results  Component Value Date   WBC 3.9 (L) 05/11/2020   HGB 13.5 05/11/2020   HCT 39.5 05/11/2020   MCV 95.9 05/11/2020   PLT 169 05/11/2020   NEUTROABS 2.2 05/11/2020    ASSESSMENT & PLAN:  Breast cancer of upper-inner quadrant of right female breast ([place]) 02/29/2016: Right breast palpable mass (with silicone implants 5366), 3.5 cm on MRI, additional 3 cm anterior linear enhancement (biopsy 03/14/2016 IDC grade 3); 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  T2 N0 stage 2A clinical stage  Treatment summary: 1. Neoadjuvant chemotherapy with dose dense Adriamycin and Cytoxan 4 followed by Abraxane weekly 5(stopped early  due to neuropathy) 2. 09/13/2016: Right lumpectomy: IDC grade 3, 2 foci, 2 cm and 1.1 cm, 0/3 lymph nodes negative margins negative, ER 0%, PR 0%, HER-2 negative ratio 1.02, Ki-67 60%, RCB-II; ypT2ypN0 Stage 2A (with plastic surgery removing the ruptured implant) 3. Followed by radiation therapy completed 12/06/2016 4.Adjuvant Xeloda 1000 mg by mouth twice a day 2 weeks on one week off 12/30/2016-Oct 2018 5.SWOG 1418 clinical trial Pembrolizumabcycle 1 given 05/03/2017(patient decided to discontinue clinical trial) ----------------------------------------------------------------------------------------------------------------------------------------- Surveillance: 1.Breast exam12/11/2019: Benign, no palpable lumps or nodules of concern. 2.mammogram11/4/2021at [facility]: No evidence of malignancy. Breast density categoryB  Chronic fatigue Chemo-induced peripheral neuropathy: Symptoms are mild.  Diabetes: Patient is trying to adjust her diet and cut down her sugar intake.  Return to clinic in 6 months with labs and follow-up     No orders of the defined types were placed in this encounter.  The patient has a good understanding of  the overall plan. she agrees with it. she will call with any problems that may develop before the next visit here.  Total time spent: 20 mins including face to face time and time spent for planning, charting and coordination of care  Nicholas Lose, MD 05/11/2020  I, Cloyde Reams Dorshimer, am acting as scribe for Dr. Nicholas Lose.  I have reviewed the above documentation for accuracy and completeness, and I agree with the above.

## 2020-05-11 ENCOUNTER — Inpatient Hospital Stay: Payer: Medicare Other

## 2020-05-11 ENCOUNTER — Inpatient Hospital Stay: Payer: Medicare Other | Attending: Hematology and Oncology | Admitting: Hematology and Oncology

## 2020-05-11 ENCOUNTER — Encounter: Payer: Self-pay | Admitting: *Deleted

## 2020-05-11 ENCOUNTER — Other Ambulatory Visit: Payer: Self-pay

## 2020-05-11 VITALS — BP 154/78 | HR 63 | Temp 97.3°F | Resp 17 | Ht 67.0 in | Wt 168.0 lb

## 2020-05-11 DIAGNOSIS — Z923 Personal history of irradiation: Secondary | ICD-10-CM | POA: Insufficient documentation

## 2020-05-11 DIAGNOSIS — C50211 Malignant neoplasm of upper-inner quadrant of right female breast: Secondary | ICD-10-CM

## 2020-05-11 DIAGNOSIS — Z7901 Long term (current) use of anticoagulants: Secondary | ICD-10-CM | POA: Diagnosis not present

## 2020-05-11 DIAGNOSIS — R5382 Chronic fatigue, unspecified: Secondary | ICD-10-CM | POA: Diagnosis not present

## 2020-05-11 DIAGNOSIS — Z171 Estrogen receptor negative status [ER-]: Secondary | ICD-10-CM | POA: Diagnosis not present

## 2020-05-11 DIAGNOSIS — Z006 Encounter for examination for normal comparison and control in clinical research program: Secondary | ICD-10-CM | POA: Diagnosis not present

## 2020-05-11 DIAGNOSIS — T451X5A Adverse effect of antineoplastic and immunosuppressive drugs, initial encounter: Secondary | ICD-10-CM | POA: Diagnosis not present

## 2020-05-11 DIAGNOSIS — Z23 Encounter for immunization: Secondary | ICD-10-CM | POA: Diagnosis not present

## 2020-05-11 DIAGNOSIS — Z86718 Personal history of other venous thrombosis and embolism: Secondary | ICD-10-CM | POA: Diagnosis not present

## 2020-05-11 DIAGNOSIS — E119 Type 2 diabetes mellitus without complications: Secondary | ICD-10-CM | POA: Diagnosis not present

## 2020-05-11 DIAGNOSIS — G620 Drug-induced polyneuropathy: Secondary | ICD-10-CM | POA: Insufficient documentation

## 2020-05-11 DIAGNOSIS — Z0000 Encounter for general adult medical examination without abnormal findings: Secondary | ICD-10-CM

## 2020-05-11 DIAGNOSIS — Z9221 Personal history of antineoplastic chemotherapy: Secondary | ICD-10-CM | POA: Insufficient documentation

## 2020-05-11 LAB — CBC WITH DIFFERENTIAL (CANCER CENTER ONLY)
Abs Immature Granulocytes: 0.01 10*3/uL (ref 0.00–0.07)
Basophils Absolute: 0 10*3/uL (ref 0.0–0.1)
Basophils Relative: 1 %
Eosinophils Absolute: 0.1 10*3/uL (ref 0.0–0.5)
Eosinophils Relative: 3 %
HCT: 39.5 % (ref 36.0–46.0)
Hemoglobin: 13.5 g/dL (ref 12.0–15.0)
Immature Granulocytes: 0 %
Lymphocytes Relative: 32 %
Lymphs Abs: 1.2 10*3/uL (ref 0.7–4.0)
MCH: 32.8 pg (ref 26.0–34.0)
MCHC: 34.2 g/dL (ref 30.0–36.0)
MCV: 95.9 fL (ref 80.0–100.0)
Monocytes Absolute: 0.3 10*3/uL (ref 0.1–1.0)
Monocytes Relative: 8 %
Neutro Abs: 2.2 10*3/uL (ref 1.7–7.7)
Neutrophils Relative %: 56 %
Platelet Count: 169 10*3/uL (ref 150–400)
RBC: 4.12 MIL/uL (ref 3.87–5.11)
RDW: 13.1 % (ref 11.5–15.5)
WBC Count: 3.9 10*3/uL — ABNORMAL LOW (ref 4.0–10.5)
nRBC: 0 % (ref 0.0–0.2)

## 2020-05-11 LAB — CMP (CANCER CENTER ONLY)
ALT: 34 U/L (ref 0–44)
AST: 27 U/L (ref 15–41)
Albumin: 4.2 g/dL (ref 3.5–5.0)
Alkaline Phosphatase: 62 U/L (ref 38–126)
Anion gap: 6 (ref 5–15)
BUN: 15 mg/dL (ref 8–23)
CO2: 28 mmol/L (ref 22–32)
Calcium: 9.8 mg/dL (ref 8.9–10.3)
Chloride: 104 mmol/L (ref 98–111)
Creatinine: 0.82 mg/dL (ref 0.44–1.00)
GFR, Estimated: >60 mL/min (ref >60)
Glucose, Bld: 166 mg/dL — ABNORMAL HIGH (ref 70–99)
Potassium: 4.5 mmol/L (ref 3.5–5.1)
Sodium: 138 mmol/L (ref 135–145)
Total Bilirubin: 1.6 mg/dL — ABNORMAL HIGH (ref 0.3–1.2)
Total Protein: 7.5 g/dL (ref 6.5–8.1)

## 2020-05-11 MED ORDER — INFLUENZA VAC A&B SA ADJ QUAD 0.5 ML IM PRSY
PREFILLED_SYRINGE | INTRAMUSCULAR | Status: AC
  Filled 2020-05-11: qty 0.5

## 2020-05-11 MED ORDER — INFLUENZA VAC A&B SA ADJ QUAD 0.5 ML IM PRSY
0.5000 mL | PREFILLED_SYRINGE | Freq: Once | INTRAMUSCULAR | Status: AC
  Administered 2020-05-11: 0.5 mL via INTRAMUSCULAR

## 2020-05-11 NOTE — Addendum Note (Signed)
Addended by: Myrtie Hawk on: 05/11/2020 12:54 PM   Modules accepted: Orders

## 2020-05-11 NOTE — Research (Signed)
36 Months Follow Up Visit for [id number] Clinical Trial:  Patient into clinic today to see Dr. Lindi Adie for 36 month study visit.  PROs; Given to patient to complete prior to lab appointment. Collected and checked for accuracy and completion.  Labs;Completed per protocol and reviewed by Dr. Lindi Adie. Concomitant medicationsreviewedwith patient;Medication list is up to date.  Patient denies any changes to medication list.  H&P, PS, Toxicity and Recurrence assessment;  Completed by Dr. Lindi Adie. See MD encounter note from today.  Thanked patient very much for her time today and ongoingparticipation in this study. Her next study visit is due 10/29/20 (+/- 4 weeks). Asked patient to call research nurse if any questions or problems prior to next study visit.  She verbalized understanding.  Foye Spurling, BSN, RN Clinical Research Nurse 05/11/2020

## 2020-05-11 NOTE — Assessment & Plan Note (Signed)
02/29/2016: Right breast palpable mass (with silicone implants 5947), 3.5 cm on MRI, additional 3 cm anterior linear enhancement (biopsy 03/14/2016 IDC grade 3); 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  T2 N0 stage 2A clinical stage  Treatment summary: 1. Neoadjuvant chemotherapy with dose dense Adriamycin and Cytoxan 4 followed by Abraxane weekly 5(stopped early due to neuropathy) 2. 09/13/2016: Right lumpectomy: IDC grade 3, 2 foci, 2 cm and 1.1 cm, 0/3 lymph nodes negative margins negative, ER 0%, PR 0%, HER-2 negative ratio 1.02, Ki-67 60%, RCB-II; ypT2ypN0 Stage 2A (with plastic surgery removing the ruptured implant) 3. Followed by radiation therapy completed 12/06/2016 4.Adjuvant Xeloda 1000 mg by mouth twice a day 2 weeks on one week off 12/30/2016-Oct 2018 5.SWOG 1418 clinical trial Pembrolizumabcycle 1 given 05/03/2017(patient decided to discontinue clinical trial) ----------------------------------------------------------------------------------------------------------------------------------------- Surveillance: 1.Breast exam12/11/2019: Benign 2.mammogram11/4/2021at [facility]: No evidence of malignancy. Breast density categoryB  Chronic fatigue Chemo-induced peripheral neuropathy: Monitoring Return to clinic in 6 months with labs and follow-up

## 2020-05-14 ENCOUNTER — Telehealth: Payer: Self-pay | Admitting: Hematology and Oncology

## 2020-05-14 NOTE — Telephone Encounter (Signed)
Scheduled per 1/26 los. Called pt and left a msg  

## 2020-05-19 ENCOUNTER — Encounter: Payer: Self-pay | Admitting: Hematology and Oncology

## 2020-05-27 ENCOUNTER — Encounter: Payer: Self-pay | Admitting: Family Medicine

## 2020-05-27 ENCOUNTER — Telehealth: Payer: Self-pay

## 2020-05-27 DIAGNOSIS — U071 COVID-19: Secondary | ICD-10-CM | POA: Insufficient documentation

## 2020-05-27 HISTORY — DX: COVID-19: U07.1

## 2020-05-27 NOTE — Telephone Encounter (Signed)
FYI  Please see below

## 2020-05-27 NOTE — Telephone Encounter (Signed)
Noted  

## 2020-05-27 NOTE — Telephone Encounter (Signed)
Patient reported to have headache and some fatigue, started yesterday 12/21.  Patient decided to get COVID tested just in case.  Patient did home test today.  Patient reports home test was positive.  Just wanted to let Dr. Anitra Lauth know. Her symptoms to include headache and fatigue. She will follow CDC guidelines to quarantine for 10 days from start of onset of symptoms, which was yesterday.  No follow up call needed at this time. Thank you

## 2020-06-02 ENCOUNTER — Encounter: Payer: Self-pay | Admitting: Family Medicine

## 2020-06-02 ENCOUNTER — Telehealth (INDEPENDENT_AMBULATORY_CARE_PROVIDER_SITE_OTHER): Payer: Medicare Other | Admitting: Family Medicine

## 2020-06-02 VITALS — Ht 67.0 in | Wt 168.0 lb

## 2020-06-02 DIAGNOSIS — R6889 Other general symptoms and signs: Secondary | ICD-10-CM | POA: Diagnosis not present

## 2020-06-02 DIAGNOSIS — Z20822 Contact with and (suspected) exposure to covid-19: Secondary | ICD-10-CM | POA: Diagnosis not present

## 2020-06-02 DIAGNOSIS — R519 Headache, unspecified: Secondary | ICD-10-CM | POA: Diagnosis not present

## 2020-06-02 LAB — POCT INFLUENZA A/B
Influenza A, POC: NEGATIVE
Influenza B, POC: NEGATIVE

## 2020-06-02 LAB — POCT RAPID STREP A (OFFICE): Rapid Strep A Screen: NEGATIVE

## 2020-06-02 MED ORDER — DOXYCYCLINE HYCLATE 100 MG PO CAPS: 100.0000 mg | ORAL_CAPSULE | Freq: Two times a day (BID) | ORAL | 0 refills | Status: AC

## 2020-06-02 MED ORDER — BENZONATATE 200 MG PO CAPS
200.0000 mg | ORAL_CAPSULE | Freq: Two times a day (BID) | ORAL | 0 refills | Status: DC | PRN
Start: 2020-06-02 — End: 2020-08-04

## 2020-06-02 MED ORDER — HYDROCODONE-HOMATROPINE 5-1.5 MG/5ML PO SYRP: 5.0000 mL | ORAL_SOLUTION | Freq: Every day | ORAL | 0 refills | Status: DC

## 2020-06-02 NOTE — Progress Notes (Signed)
VIRTUAL VISIT VIA VIDEO  I connected with Marissa Hall on 06/02/20 at  4:00 PM EST by elemedicine application and verified that I am speaking with the correct person using two identifiers. Location patient: Home Location provider: [facility], Office Persons participating in the virtual visit: Patient, Dr. Raoul Pitch and D.Moffitt, CMA  I discussed the limitations of evaluation and management by telemedicine and the availability of in person appointments. The patient expressed understanding and agreed to proceed.   SUBJECTIVE Chief Complaint  Patient presents with  . Acute Visit    HA's, chills, chest congestion, cough, nasal congestion x 1 week. She took a home Covid test that was positive then another one that was negative.  She just came by the office to be swabbed for covid.      HPI: Marissa Hall is a 79 y.o. reports her symptoms started Tuesday 12/21 with headache, chills chest congestion, nasal congestion.  She states she took a home Covid test on Wednesday 12/22 and it was positive.  At that time she has had daily chills and a new productive cough.  She states the cough has become worse and is keeping her up at night.  She denies shortness of breath.  She has maintained her sense of taste and smell.  She has had no direct Covid exposure that she is aware of.  Try Delsym cough syrup and reports it was not even a little bit effective.  She states she took another Covid test yesterday and it was negative. She has had 2 pfizer vaccines > 8 mos ago. Her flu shot is UTD.  She has a significant past medical history of breast cancer in 2017 in remission.  She states her last appointment she was told she was cleared.  She has not had radiation or chemo. ROS: See pertinent positives and negatives per HPI.  Patient Active Problem List   Diagnosis Date Noted  . Piriformis syndrome of right side 05/16/2019  . Lower extremity pain, right 04/23/2019  . DVT (deep venous thrombosis)  ([place]) 04/23/2019  . Lumbar radiculopathy, right 04/02/2019  . Other long term (current) drug therapy 06/14/2017  . Anxiety and depression   . History of breast cancer 03/02/2017  . History of therapeutic radiation 03/02/2017  . Breast pain, right 10/03/2016  . Type 2 diabetes mellitus ([place]) 08/23/2016  . Hypertension associated with diabetes ([place]) 08/23/2016  . Hypothyroidism 08/23/2016  . Port catheter in place 05/09/2016  . Peripheral neuropathy 05/09/2016  . Breast cancer of upper-inner quadrant of right female breast ([place]) 03/09/2016  . Major depressive disorder, recurrent ([place]) 02/27/2014  . Uncomplicated alcohol dependence ([place]) 02/19/2014    Social History   Tobacco Use  . Smoking status: Former Smoker    Packs/day: 1.00    Types: Cigarettes    Quit date: 03/08/1999    Years since quitting: 21.2  . Smokeless tobacco: Never Used  Substance Use Topics  . Alcohol use: Yes    Alcohol/week: 7.0 standard drinks    Types: 7 Shots of liquor per week    Comment:  daily    Current Outpatient Medications:  .  atorvastatin (LIPITOR) 80 MG tablet, Take 80 mg by mouth every evening. , Disp: , Rfl:  .  benzonatate (TESSALON) 200 MG capsule, Take 1 capsule (200 mg total) by mouth 2 (two) times daily as needed for cough., Disp: 20 capsule, Rfl: 0 .  DULoxetine (CYMBALTA) 30 MG capsule, TAKE ONE CAPSULE BY MOUTH DAILY, Disp: 30 capsule,  Rfl: 1 .  HYDROcodone-homatropine (HYCODAN) 5-1.5 MG/5ML syrup, Take 5 mLs by mouth at bedtime., Disp: 120 mL, Rfl: 0 .  levothyroxine (SYNTHROID) 112 MCG tablet, 112 mcg daily before breakfast. , Disp: , Rfl:  .  lisinopril (ZESTRIL) 30 MG tablet, Take 1 tablet (30 mg total) by mouth daily., Disp: 90 tablet, Rfl: 1 .  metFORMIN (GLUCOPHAGE-XR) 500 MG 24 hr tablet, Take 1,000 mg by mouth daily before supper. , Disp: , Rfl:  .  Propylene Glycol (SYSTANE BALANCE OP), Place 1 drop into both eyes daily as needed (for dry eyes)., Disp: , Rfl:  .  traZODone  (DESYREL) 50 MG tablet, Take 1 tablet (50 mg total) by mouth at bedtime., Disp: 90 tablet, Rfl: 3 .  cholecalciferol (VITAMIN D3) 25 MCG (1000 UNIT) tablet, Take 1,000 Units by mouth daily. (Patient not taking: Reported on 06/02/2020), Disp: , Rfl:  .  doxycycline (VIBRAMYCIN) 100 MG capsule, Take 1 capsule (100 mg total) by mouth 2 (two) times daily for 10 days., Disp: 20 capsule, Rfl: 0 .  gabapentin (NEURONTIN) 300 MG capsule, Take 1 capsule (300 mg total) by mouth See admin instructions. Takes 300 mg in the morning and 600 mg at bedtime. (Patient not taking: Reported on 06/02/2020), Disp: 270 capsule, Rfl: 3 .  NONFORMULARY OR COMPOUNDED ITEM, Estradiol vaginal cream 0.02% insert 1/4 of an applicator vaginally and thin layer vulva daily x 2 weeks.  Then 1/4 of an applicator vaginally and a thin layer on the vulva twice a week for long term (Patient not taking: Reported on 06/02/2020), Disp: 90 each, Rfl: 3  Allergies  Allergen Reactions  . Other Other (See Comments)    STEROIDS- emotional  . Penicillins Other (See Comments)    Unsure of reaction, was 79 years old    OBJECTIVE: Ht 5\' 7"  (1.702 m)   Wt 168 lb (76.2 kg) Comment: pt reported  BMI 26.31 kg/m  Gen: No acute distress. Nontoxic in appearance.  HENT: AT. [place].  MMM.  Eyes:Pupils Equal Round Reactive to light, Extraocular movements intact,  Conjunctiva without redness, discharge or icterus. Chest: Cough present on exam.  No shortness of breath present. Skin: No rashes, purpura or petechiae.  Neuro: Alert. Oriented x3  Psych: Normal affect and demeanor. Normal speech. Normal thought content and judgment.  ASSESSMENT AND PLAN: Marissa Hall is a 79 y.o. female present for  Suspected COVID-19 virus infection/Nonintractable headache, unspecified chronicity pattern, unspecified headache type Patient's HPI and timeline of her illness and home testing, I suspect she did have a Covid infection in any original positive test was  accurate.  Her later negative test was likely completed  around day 10 (or later) of illness .  With her ongoing symptoms and worsening cough concerned for possible onset of pneumonia after Covid. - POCT Influenza A/B> negative - Novel Coronavirus, NAA ([facility])> pending - POCT rapid strep A> negative - Upper Respiratory Culture> pending encourage patient to rest and hydrate. Tylenol/NSAIDs for comfort unless otherwise contraindicated. Mucinex DM OTC encouraged with Tessalon Per[place] prescribed for cough. Hycodan cough syrup prescribed for nighttime use to help with cough and sleep. Doxycycline twice daily x10 days for treatment of likely bacterial infection/pneumonia Follow-up 1-2 weeks with PCP, sooner if worsening.    Howard Pouch, DO 06/02/2020   Return in about 2 weeks (around 06/16/2020), or if symptoms worsen or fail to improve.  Orders Placed This Encounter  Procedures  . Novel Coronavirus, NAA ([facility])  . Upper Respiratory Culture  .  POCT Influenza A/B  . POCT rapid strep A   Meds ordered this encounter  Medications  . doxycycline (VIBRAMYCIN) 100 MG capsule    Sig: Take 1 capsule (100 mg total) by mouth 2 (two) times daily for 10 days.    Dispense:  20 capsule    Refill:  0  . benzonatate (TESSALON) 200 MG capsule    Sig: Take 1 capsule (200 mg total) by mouth 2 (two) times daily as needed for cough.    Dispense:  20 capsule    Refill:  0  . HYDROcodone-homatropine (HYCODAN) 5-1.5 MG/5ML syrup    Sig: Take 5 mLs by mouth at bedtime.    Dispense:  120 mL    Refill:  0   Referral Orders  No referral(s) requested today

## 2020-06-02 NOTE — Patient Instructions (Signed)
COVID-19 COVID-19 is a respiratory infection that is caused by a virus called severe acute respiratory syndrome coronavirus 2 (SARS-CoV-2). The disease is also known as coronavirus disease or novel coronavirus. In some people, the virus may not cause any symptoms. In others, it may cause a serious infection. The infection can get worse quickly and can lead to complications, such as:  Pneumonia, or infection of the lungs.  Acute respiratory distress syndrome or ARDS. This is a condition in which fluid build-up in the lungs prevents the lungs from filling with air and passing oxygen into the blood.  Acute respiratory failure. This is a condition in which there is not enough oxygen passing from the lungs to the body or when carbon dioxide is not passing from the lungs out of the body.  Sepsis or septic shock. This is a serious bodily reaction to an infection.  Blood clotting problems.  Secondary infections due to bacteria or fungus.  Organ failure. This is when your body's organs stop working. The virus that causes COVID-19 is contagious. This means that it can spread from person to person through droplets from coughs and sneezes (respiratory secretions). What are the causes? This illness is caused by a virus. You may catch the virus by:  Breathing in droplets from an infected person. Droplets can be spread by a person breathing, speaking, singing, coughing, or sneezing.  Touching something, like a table or a doorknob, that was exposed to the virus (contaminated) and then touching your mouth, nose, or eyes. What increases the risk? Risk for infection You are more likely to be infected with this virus if you:  Are within 6 feet (2 meters) of a person with COVID-19.  Provide care for or live with a person who is infected with COVID-19.  Spend time in crowded indoor spaces or live in shared housing. Risk for serious illness You are more likely to become seriously ill from the virus if you:   Are 50 years of age or older. The higher your age, the more you are at risk for serious illness.  Live in a nursing home or long-term care facility.  Have cancer.  Have a long-term (chronic) disease such as: ? Chronic lung disease, including chronic obstructive pulmonary disease or asthma. ? A long-term disease that lowers your body's ability to fight infection (immunocompromised). ? Heart disease, including heart failure, a condition in which the arteries that lead to the heart become narrow or blocked (coronary artery disease), a disease which makes the heart muscle thick, weak, or stiff (cardiomyopathy). ? Diabetes. ? Chronic kidney disease. ? Sickle cell disease, a condition in which red blood cells have an abnormal "sickle" shape. ? Liver disease.  Are obese. What are the signs or symptoms? Symptoms of this condition can range from mild to severe. Symptoms may appear any time from 2 to 14 days after being exposed to the virus. They include:  A fever or chills.  A cough.  Difficulty breathing.  Headaches, body aches, or muscle aches.  Runny or stuffy (congested) nose.  A sore throat.  New loss of taste or smell. Some people may also have stomach problems, such as nausea, vomiting, or diarrhea. Other people may not have any symptoms of COVID-19. How is this diagnosed? This condition may be diagnosed based on:  Your signs and symptoms, especially if: ? You live in an area with a COVID-19 outbreak. ? You recently traveled to or from an area where the virus is common. ? You   provide care for or live with a person who was diagnosed with COVID-19. ? You were exposed to a person who was diagnosed with COVID-19.  A physical exam.  Lab tests, which may include: ? Taking a sample of fluid from the back of your nose and throat (nasopharyngeal fluid), your nose, or your throat using a swab. ? A sample of mucus from your lungs (sputum). ? Blood tests.  Imaging tests, which  may include, X-rays, CT scan, or ultrasound. How is this treated? At present, there is no medicine to treat COVID-19. Medicines that treat other diseases are being used on a trial basis to see if they are effective against COVID-19. Your health care provider will talk with you about ways to treat your symptoms. For most people, the infection is mild and can be managed at home with rest, fluids, and over-the-counter medicines. Treatment for a serious infection usually takes places in a hospital intensive care unit (ICU). It may include one or more of the following treatments. These treatments are given until your symptoms improve.  Receiving fluids and medicines through an IV.  Supplemental oxygen. Extra oxygen is given through a tube in the nose, a face mask, or a hood.  Positioning you to lie on your stomach (prone position). This makes it easier for oxygen to get into the lungs.  Continuous positive airway pressure (CPAP) or bi-level positive airway pressure (BPAP) machine. This treatment uses mild air pressure to keep the airways open. A tube that is connected to a motor delivers oxygen to the body.  Ventilator. This treatment moves air into and out of the lungs by using a tube that is placed in your windpipe.  Tracheostomy. This is a procedure to create a hole in the neck so that a breathing tube can be inserted.  Extracorporeal membrane oxygenation (ECMO). This procedure gives the lungs a chance to recover by taking over the functions of the heart and lungs. It supplies oxygen to the body and removes carbon dioxide. Follow these instructions at home: Lifestyle  If you are sick, stay home except to get medical care. Your health care provider will tell you how long to stay home. Call your health care provider before you go for medical care.  Rest at home as told by your health care provider.  Do not use any products that contain nicotine or tobacco, such as cigarettes, e-cigarettes, and  chewing tobacco. If you need help quitting, ask your health care provider.  Return to your normal activities as told by your health care provider. Ask your health care provider what activities are safe for you. General instructions  Take over-the-counter and prescription medicines only as told by your health care provider.  Drink enough fluid to keep your urine pale yellow.  Keep all follow-up visits as told by your health care provider. This is important. How is this prevented?  There is no vaccine to help prevent COVID-19 infection. However, there are steps you can take to protect yourself and others from this virus. To protect yourself:   Do not travel to areas where COVID-19 is a risk. The areas where COVID-19 is reported change often. To identify high-risk areas and travel restrictions, check the CDC travel website: wwwnc.cdc.gov/travel/notices  If you live in, or must travel to, an area where COVID-19 is a risk, take precautions to avoid infection. ? Stay away from people who are sick. ? Wash your hands often with soap and water for 20 seconds. If soap and water   are not available, use an alcohol-based hand sanitizer. ? Avoid touching your mouth, face, eyes, or nose. ? Avoid going out in public, follow guidance from your state and local health authorities. ? If you must go out in public, wear a cloth face covering or face mask. Make sure your mask covers your nose and mouth. ? Avoid crowded indoor spaces. Stay at least 6 feet (2 meters) away from others. ? Disinfect objects and surfaces that are frequently touched every day. This may include:  Counters and tables.  Doorknobs and light switches.  Sinks and faucets.  Electronics, such as phones, remote controls, keyboards, computers, and tablets. To protect others: If you have symptoms of COVID-19, take steps to prevent the virus from spreading to others.  If you think you have a COVID-19 infection, contact your health care  provider right away. Tell your health care team that you think you may have a COVID-19 infection.  Stay home. Leave your house only to seek medical care. Do not use public transport.  Do not travel while you are sick.  Wash your hands often with soap and water for 20 seconds. If soap and water are not available, use alcohol-based hand sanitizer.  Stay away from other members of your household. Let healthy household members care for children and pets, if possible. If you have to care for children or pets, wash your hands often and wear a mask. If possible, stay in your own room, separate from others. Use a different bathroom.  Make sure that all people in your household wash their hands well and often.  Cough or sneeze into a tissue or your sleeve or elbow. Do not cough or sneeze into your hand or into the air.  Wear a cloth face covering or face mask. Make sure your mask covers your nose and mouth. Where to find more information  Centers for Disease Control and Prevention: www.cdc.gov/coronavirus/2019-ncov/index.html  World Health Organization: www.who.int/health-topics/coronavirus Contact a health care provider if:  You live in or have traveled to an area where COVID-19 is a risk and you have symptoms of the infection.  You have had contact with someone who has COVID-19 and you have symptoms of the infection. Get help right away if:  You have trouble breathing.  You have pain or pressure in your chest.  You have confusion.  You have bluish lips and fingernails.  You have difficulty waking from sleep.  You have symptoms that get worse. These symptoms may represent a serious problem that is an emergency. Do not wait to see if the symptoms will go away. Get medical help right away. Call your local emergency services (911 in the [place] Do not drive yourself to the hospital. Let the emergency medical personnel know if you think you have COVID-19. Summary  COVID-19 is a  respiratory infection that is caused by a virus. It is also known as coronavirus disease or novel coronavirus. It can cause serious infections, such as pneumonia, acute respiratory distress syndrome, acute respiratory failure, or sepsis.  The virus that causes COVID-19 is contagious. This means that it can spread from person to person through droplets from breathing, speaking, singing, coughing, or sneezing.  You are more likely to develop a serious illness if you are 50 years of age or older, have a weak immune system, live in a nursing home, or have chronic disease.  There is no medicine to treat COVID-19. Your health care provider will talk with you about ways to treat your symptoms.    Take steps to protect yourself and others from infection. Wash your hands often and disinfect objects and surfaces that are frequently touched every day. Stay away from people who are sick and wear a mask if you are sick. This information is not intended to replace advice given to you by your health care provider. Make sure you discuss any questions you have with your health care provider. Document Revised: 03/22/2019 Document Reviewed: 06/28/2018 Elsevier Patient Education  2020 [employer]  

## 2020-06-03 ENCOUNTER — Telehealth: Payer: Self-pay

## 2020-06-03 DIAGNOSIS — E519 Thiamine deficiency, unspecified: Secondary | ICD-10-CM | POA: Diagnosis not present

## 2020-06-03 DIAGNOSIS — Z20822 Contact with and (suspected) exposure to covid-19: Secondary | ICD-10-CM | POA: Diagnosis not present

## 2020-06-03 MED ORDER — GUAIFENESIN-CODEINE 100-10 MG/5ML PO SYRP: 5.0000 mL | ORAL_SOLUTION | Freq: Three times a day (TID) | ORAL | 0 refills | Status: DC | PRN

## 2020-06-03 NOTE — Telephone Encounter (Signed)
Please inform pt I have called in virtussin for her.

## 2020-06-03 NOTE — Telephone Encounter (Signed)
Patient advised and voiced understanding.  

## 2020-06-03 NOTE — Telephone Encounter (Signed)
[facility] called and stated that they were only able to provide 20 mL of cough syrup that was prescribed yesterday. They have Virtussin but will not be able to give her any more of prescribed medication because their supplier has none. Please advise.

## 2020-06-03 NOTE — Addendum Note (Signed)
Addended by: Felix Pacini A on: 06/03/2020 02:42 PM   Modules accepted: Orders

## 2020-06-04 LAB — CULTURE, UPPER RESPIRATORY
MICRO NUMBER:: 11362141
SPECIMEN QUALITY:: ADEQUATE

## 2020-06-04 LAB — NOVEL CORONAVIRUS, NAA: SARS-CoV-2, NAA: NOT DETECTED

## 2020-06-04 LAB — SARS-COV-2, NAA 2 DAY TAT

## 2020-06-17 ENCOUNTER — Telehealth: Payer: Self-pay | Admitting: Family Medicine

## 2020-06-17 NOTE — Telephone Encounter (Signed)
Patient had VV with Dr. Raoul Pitch 06/02/20 and was told to call Dr. Anitra Lauth if symptoms persist. Today, she is still having cough, sometimes productive sometimes dry, sinus pressure, headache and lethargy. Positive covid home test 05/26/20 then negative PCR test 06/02/20 via this office. No available appts with Dr. Anitra Lauth until next week. Please call patient to advise.

## 2020-06-17 NOTE — Telephone Encounter (Signed)
Could please if other providers could do a vv with patient today?

## 2020-06-18 ENCOUNTER — Other Ambulatory Visit: Payer: Self-pay

## 2020-06-18 ENCOUNTER — Encounter: Payer: Self-pay | Admitting: Family Medicine

## 2020-06-18 ENCOUNTER — Telehealth (INDEPENDENT_AMBULATORY_CARE_PROVIDER_SITE_OTHER): Payer: Medicare Other | Admitting: Family Medicine

## 2020-06-18 VITALS — Temp 101.0°F

## 2020-06-18 DIAGNOSIS — J189 Pneumonia, unspecified organism: Secondary | ICD-10-CM | POA: Diagnosis not present

## 2020-06-18 DIAGNOSIS — R5081 Fever presenting with conditions classified elsewhere: Secondary | ICD-10-CM

## 2020-06-18 DIAGNOSIS — U071 COVID-19: Secondary | ICD-10-CM | POA: Diagnosis not present

## 2020-06-18 MED ORDER — GUAIFENESIN-CODEINE 100-10 MG/5ML PO SYRP: 5.0000 mL | ORAL_SOLUTION | Freq: Four times a day (QID) | ORAL | 0 refills | Status: DC | PRN

## 2020-06-18 MED ORDER — LEVOFLOXACIN 500 MG PO TABS: 500.0000 mg | ORAL_TABLET | Freq: Every day | ORAL | 0 refills | Status: DC

## 2020-06-18 NOTE — Progress Notes (Signed)
Virtual Visit via Video Note  I connected with Marissa Hall on 06/18/20 at  4:00 PM EST by a video enabled telemedicine application and verified that I am speaking with the correct person using two identifiers.  Location patient: home, [place] Location provider:work or home office Persons participating in the virtual visit: patient, provider  I discussed the limitations of evaluation and management by telemedicine and the availability of in person appointments. The patient expressed understanding and agreed to proceed.  Telemedicine visit is a necessity given the COVID-19 restrictions in place at the current time.  HPI: 80 y/o WF being seen today for sinus pressure, cough, fatigue---hx of covid pos 05/26/20. Was seen virtual by Dr. Raoul Pitch 06/02/20, rx'd doxy and hycodan.  Also did upper resp clx/strep/flu/covid swabs done-->all NEG.  Since that time:  Lots of PND.  Lying supine makes her cough worse. COughing a lot, hurts in back and chest when coughs, has temp 101 on /off for the last 1 d or so.  Denies SOB.  No n/v/d.  Hycodan tid helps some.   Drinking lots of water. Ibup helps fever for a little while.  ROS: as above, plus->no wheezing, no dizziness, no HAs, no rashes, no melena/hematochezia.  No polyuria or polydipsia.  No myalgias or arthralgias.  No focal weakness, paresthesias, or tremors.  No acute vision or hearing abnormalities. No n/v/d or abd pain.  No palpitations.    Past Medical History:  Diagnosis Date  . Acute DVT (deep venous thrombosis) ([place]) 04/23/2019   Right popliteal  . Anxiety and depression 1964   oncologist started duloxetine 12/2016  . Breast cancer ([place]) 03/14/2016   Clinical stage 2A: (triple neg): Right breast, upper inner quadrant, 03/2016.  Neoadjuvant chemo x 5 cycles,lumpectomy 4 mo later, then RT started 10/2016.  Adjuvant Xeloda [id number].  SWOG research trial Marissa Hall 04/2017--Marissa Hall randomized to pembrolizumab immunotherapy.  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 Cancer-free at 05/2018 onc f/u.  [name] Kitchen Cataracts, bilateral 07/2017  . 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 COVID-19 virus infection 05/27/2020  . Depression 1964   Patient states since age 86  . Diabetes mellitus with complication ([place]) [id number]   managed by endocrinology.  A1c Mar 12, 2018 was 7.0% at Dr. Shirlyn Goltz.   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  Excision done 02/2019  . GERD (gastroesophageal reflux disease) 2013  . Hyperlipidemia 1986  . Hypertension 2008  . Hypothyroidism 1988   Diagnosed in her 34s.  Managed by Endocrinologist  . Lumbar radiculopathy 04/2019   Dr. Tamala Julian to get plain films of LB and hip (considering MRI due to her hx of cancer)  . Osteoporosis 2015   Marissa Hall states "osteopenia", but then says that she refused to take the rx med for this condition, so I suspect she had osteoporosis.  . Peripheral neuropathy 2017   Patient states diabetic neuropathy in feet prior to starting chemotherapy and then worsened by chemo.   [name] Kitchen TIA (transient ischemic attack) 03/26/2011    Past Surgical History:  Procedure Laterality Date  . ABDOMINAL HYSTERECTOMY  1972  . APPENDECTOMY  1972  . BREAST ENHANCEMENT SURGERY  1982  . BREAST IMPLANT REMOVAL Right 09/13/2016   Procedure: REMOVAL RIGHT BREAST IMPLANT;  Surgeon: Irene Limbo, MD;  Location: [place];  Service: Plastics;  Laterality: Right;  . BREAST LUMPECTOMY WITH  RADIOACTIVE SEED AND SENTINEL LYMPH NODE BIOPSY Right 09/13/2016   Procedure: RIGHT BREAST LUMPECTOMY WITH RADIOACTIVE SEED X 2 AND SENTINEL LYMPH NODE BIOPSY;  Surgeon: Alphonsa Overall, MD;  Location: [facility];  Service: General;  Laterality: Right;  . BREAST SURGERY Right 03/14/2016   Biopsy  . CAPSULECTOMY Right 09/13/2016   Procedure: RIGHT CAPSULECTOMY;  Surgeon: Irene Limbo, MD;  Location: [place];  Service: Plastics;  Laterality: Right;  . CATARACT EXTRACTION, BILATERAL Bilateral 08/10/17 right eye, 08/31/17 left eye  . MASS EXCISION Left 02/28/2018   Path: benign.  Procedure: EXCISIONLEFT MEDIAL THIGH MASS ERAS PATHWAY;  Surgeon: Erroll Luna, MD;  Location: [place];  Service: General;  Laterality: Left;  . PORTACATH PLACEMENT N/A 03/15/2016   Procedure: INSERTION PORT-A-CATH WITH [place];  Surgeon: Alphonsa Overall, MD;  Location: [facility];  Service: General;  Laterality: N/A;  . PORTACATH REMOVAL  07/2017  . surgical repair left ankle Left 2009   s/p Fall   . TONSILLECTOMY AND ADENOIDECTOMY  1948   Age 48     Current Outpatient Medications:  .  atorvastatin (LIPITOR) 80 MG tablet, Take 80 mg by mouth every evening. , Disp: , Rfl:  .  DULoxetine (CYMBALTA) 30 MG capsule, TAKE ONE CAPSULE BY MOUTH DAILY, Disp: 30 capsule, Rfl: 1 .  gabapentin (NEURONTIN) 300 MG capsule, Take 1 capsule (300 mg total) by mouth See admin instructions. Takes 300 mg in the morning and 600 mg at bedtime., Disp: 270 capsule, Rfl: 3 .  guaiFENesin-codeine (ROBITUSSIN AC) 100-10 MG/5ML syrup, Take 5 mLs by mouth 3 (three) times daily as needed for cough., Disp: 120 mL, Rfl: 0 .  levothyroxine (SYNTHROID) 112 MCG tablet, 112 mcg daily before breakfast. , Disp: , Rfl:  .  lisinopril (ZESTRIL) 30 MG tablet, Take 1 tablet (30 mg total) by mouth daily., Disp: 90 tablet, Rfl: 1 .  metFORMIN (GLUCOPHAGE-XR) 500 MG 24 hr tablet, Take 1,000 mg by mouth daily before supper. , Disp: , Rfl:  .  traZODone (DESYREL) 50 MG tablet, Take 1 tablet (50 mg total) by mouth at bedtime., Disp: 90 tablet, Rfl: 3 .  benzonatate (TESSALON) 200 MG capsule, Take 1 capsule (200 mg total) by mouth 2 (two) times daily as needed for cough. (Patient not taking: Reported on 06/18/2020), Disp: 20 capsule, Rfl: 0 .  cholecalciferol (VITAMIN D3) 25 MCG (1000 UNIT) tablet, Take 1,000 Units by mouth daily. (Patient not taking:  No sig reported), Disp: , Rfl:  .  NONFORMULARY OR COMPOUNDED ITEM, Estradiol vaginal cream 0.02% insert 1/4 of an applicator vaginally and thin layer vulva daily x 2 weeks.  Then 1/4 of an applicator vaginally and a thin layer on the vulva twice a week for long term (Patient not taking: No sig reported), Disp: 90 each, Rfl: 3 .  Propylene Glycol (SYSTANE BALANCE OP), Place 1 drop into both eyes daily as needed (for dry eyes). (Patient not taking: Reported on 06/18/2020), Disp: , Rfl:   EXAM:  VITALS per patient if applicable:  Vitals with BMI 06/02/2020 05/11/2020 01/17/2020  Height 5\' 7"  5\' 7"  -  Weight 168 lbs 168 lbs 167 lbs  BMI 26.31 46.56 81.27  Systolic - 517 -  Diastolic - 78 -  Pulse - 63 -   GENERAL: alert, oriented, appears tired. Is in no acute distress  HEENT: atraumatic, conjunttiva clear, no obvious abnormalities on inspection of external nose and ears  NECK: normal movements of the head  and neck  LUNGS: on inspection no signs of respiratory distress, breathing rate appears normal, no obvious gross SOB, gasping or wheezing  CV: no obvious cyanosis  MS: moves all visible extremities without noticeable abnormality  PSYCH/NEURO: pleasant and cooperative, no obvious depression or anxiety, speech and thought processing grossly intact  LABS: none today    Chemistry      Component Value Date/Time   NA 138 05/11/2020 1150   NA 136 05/01/2017 0914   K 4.5 05/11/2020 1150   K 4.5 05/01/2017 0914   CL 104 05/11/2020 1150   CO2 28 05/11/2020 1150   CO2 24 05/01/2017 0914   BUN 15 05/11/2020 1150   BUN 14.9 05/01/2017 0914   CREATININE 0.82 05/11/2020 1150   CREATININE 0.8 05/01/2017 0914   GLU 181 09/20/2019 0000      Component Value Date/Time   CALCIUM 9.8 05/11/2020 1150   CALCIUM 9.6 05/01/2017 0914   ALKPHOS 62 05/11/2020 1150   ALKPHOS 75 05/01/2017 0914   AST 27 05/11/2020 1150   AST 51 (H) 05/01/2017 0914   ALT 34 05/11/2020 1150   ALT 62 (H) 05/01/2017  0914   BILITOT 1.6 (H) 05/11/2020 1150   BILITOT 1.30 (H) 05/01/2017 0914     Lab Results  Component Value Date   HGBA1C 7.4 03/23/2020   Lab Results  Component Value Date   WBC 3.9 (L) 05/11/2020   HGB 13.5 05/11/2020   HCT 39.5 05/11/2020   MCV 95.9 05/11/2020   PLT 169 05/11/2020    ASSESSMENT AND PLAN:  Discussed the following assessment and plan:  Covid 19 resp infxn (pos test 3 wks ago), has prolonged sx's and now with fever and worsening cough suspicious for secondary bacterial infection (pneumonia). Start levaquin 500 mg qd x 7d.  Continue guaif/codeine cough syrup 1 tsp qid prn---new rx today.  Continue ibup 600 mg q6h prn fever. Will get her in contact with the post covid resp clinic.  I discussed the assessment and treatment plan with the patient. The patient was provided an opportunity to ask questions and all were answered. The patient agreed with the plan and demonstrated an understanding of the instructions.   F/u: if not improving  Signed:  Crissie Sickles, MD           06/18/2020

## 2020-06-19 ENCOUNTER — Ambulatory Visit: Payer: Medicare Other

## 2020-06-23 ENCOUNTER — Ambulatory Visit (INDEPENDENT_AMBULATORY_CARE_PROVIDER_SITE_OTHER): Payer: Medicare Other | Admitting: Nurse Practitioner

## 2020-06-23 VITALS — HR 73 | Temp 97.1°F

## 2020-06-23 DIAGNOSIS — Z8616 Personal history of COVID-19: Secondary | ICD-10-CM

## 2020-06-23 NOTE — Patient Instructions (Addendum)
History of Covid Cough congestion:   Stay well hydrated  Stay active  Deep breathing exercises  May take tylenol or fever or pain  May take mucinex twice daily  May start zyrtec  May try magnesium for headaches  Will order chest x ray:  [facility] [address], [place] [address] [phone] MON - FRI 8:00 AM - 4:00 PM - WALK IN   Follow up:  Follow up if needed

## 2020-06-23 NOTE — Assessment & Plan Note (Signed)
Cough congestion:   Stay well hydrated  Stay active  Deep breathing exercises  May take tylenol or fever or pain  May take mucinex twice daily  May start zyrtec  May try magnesium for headaches  Will order chest x ray:  [facility] [address], [place] [address] [phone] MON - FRI 8:00 AM - 4:00 PM - WALK IN

## 2020-06-23 NOTE — Progress Notes (Signed)
@Patient  ID: Marissa Hall, female    DOB: 1940-07-23, 80 y.o.   MRN: [id number]  Chief Complaint  Patient presents with  . Covid Positive    Cough / congestion / post nasal drip    Referring provider: Tammi Sou, MD   80 year old female with history of hypertension, TIA, DVT, GERD, hypothyroidism, diabetes  HPI  Patient presents today for post-COVID care clinic visit. She states that she had a positive COVID test at home on 05/27/2019. She was seen in doctor's office on 06/02/2021 and had a negative test at that time. Patient states that she has continued to have symptoms since she tested positive. She has been given a round of doxycycline and prescribed Hycodan by PCP office. She also had a virtual visit last Friday and was started on Levaquin. She states that she is finishing this medication today. Patient states that she is improving. She does still have cough but states that she thinks it is more due to postnasal drip and chest congestion. Patient has not had imaging done since symptoms started. Denies f/c/s, n/v/d, hemoptysis, PND, chest pain or edema.       Allergies  Allergen Reactions  . Other Other (See Comments)    STEROIDS- emotional  . Penicillins Other (See Comments)    Unsure of reaction, was 80 years old    Immunization History  Administered Date(s) Administered  . Fluad Quad(high Dose 65+) 03/13/2019, 05/11/2020  . Influenza, High Dose Seasonal PF 05/03/2017  . Influenza,inj,Quad PF,6+ Mos 04/11/2016  . PFIZER(Purple Top)SARS-COV-2 Vaccination 07/21/2019, 08/13/2019    Past Medical History:  Diagnosis Date  . Acute DVT (deep venous thrombosis) ([place]) 04/23/2019   Right popliteal  . Anxiety and depression 1964   oncologist started duloxetine 12/2016  . Breast cancer ([place]) 03/14/2016   Clinical stage 2A: (triple neg): Right breast, upper inner quadrant, 03/2016.  Neoadjuvant chemo x 5 cycles,lumpectomy 4 mo later, then RT started 10/2016.  Adjuvant  Xeloda [id number].  SWOG research trial pt 04/2017--pt randomized to pembrolizumab immunotherapy.  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  [name] Kitchen Cataracts, bilateral 07/2017  . 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 COVID-19 virus infection 05/27/2020  . Depression 1964   Patient states since age 47  . Diabetes mellitus with complication ([place]) 9485   managed by endocrinology.  A1c Mar 12, 2018 was 7.0% at Dr. Shirlyn Goltz.   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  Excision done 02/2019  . GERD (gastroesophageal reflux disease) 2013  . Hyperlipidemia 1986  . Hypertension 2008  . Hypothyroidism 1988   Diagnosed in her 19s.  Managed by Endocrinologist  . Lumbar radiculopathy 04/2019   Dr. Tamala Julian to get plain films of LB and hip (considering MRI due to her hx of cancer)  . Osteoporosis 2015   pt states "osteopenia", but then says that she refused to take the rx med for this condition, so I suspect she had osteoporosis.  . Peripheral neuropathy 2017   Patient states diabetic neuropathy in feet prior to starting chemotherapy and then worsened by chemo.   [name] Kitchen TIA (transient ischemic attack) 03/26/2011    Tobacco History: Social History   Tobacco Use  Smoking Status Former Smoker  . Packs/day: 1.00  . Types: Cigarettes  . Quit date: 03/08/1999  . Years since quitting: 21.3  Smokeless Tobacco Never Used   Counseling given: Yes   Outpatient Encounter Medications as of 06/23/2020  Medication Sig  . atorvastatin (LIPITOR) 80 MG tablet Take 80 mg by mouth every evening.   . benzonatate (TESSALON) 200 MG capsule Take 1 capsule (200 mg total) by mouth 2 (two) times daily as needed for cough. (Patient not taking: Reported on 06/18/2020)  . cholecalciferol (VITAMIN D3) 25 MCG (1000 UNIT) tablet Take 1,000 Units by mouth daily. (Patient not  taking: No sig reported)  . DULoxetine (CYMBALTA) 30 MG capsule TAKE ONE CAPSULE BY MOUTH DAILY  . gabapentin (NEURONTIN) 300 MG capsule Take 1 capsule (300 mg total) by mouth See admin instructions. Takes 300 mg in the morning and 600 mg at bedtime.  [name] Kitchen guaiFENesin-codeine (ROBITUSSIN AC) 100-10 MG/5ML syrup Take 5 mLs by mouth 4 (four) times daily as needed for cough.  [name] Kitchen levofloxacin (LEVAQUIN) 500 MG tablet Take 1 tablet (500 mg total) by mouth daily.  [name] Kitchen levothyroxine (SYNTHROID) 112 MCG tablet 112 mcg daily before breakfast.   . lisinopril (ZESTRIL) 30 MG tablet Take 1 tablet (30 mg total) by mouth daily.  . metFORMIN (GLUCOPHAGE-XR) 500 MG 24 hr tablet Take 1,000 mg by mouth daily before supper.   . NONFORMULARY OR COMPOUNDED ITEM Estradiol vaginal cream 0.02% insert 1/4 of an applicator vaginally and thin layer vulva daily x 2 weeks.  Then 1/4 of an applicator vaginally and a thin layer on the vulva twice a week for long term (Patient not taking: No sig reported)  . Propylene Glycol (SYSTANE BALANCE OP) Place 1 drop into both eyes daily as needed (for dry eyes). (Patient not taking: Reported on 06/18/2020)  . traZODone (DESYREL) 50 MG tablet Take 1 tablet (50 mg total) by mouth at bedtime.   No facility-administered encounter medications on file as of 06/23/2020.     Review of Systems  Review of Systems  Constitutional: Negative.  Negative for fatigue and fever.  HENT: Positive for congestion and postnasal drip.   Respiratory: Positive for cough and shortness of breath.   Cardiovascular: Negative.  Negative for chest pain, palpitations and leg swelling.  Gastrointestinal: Negative.   Allergic/Immunologic: Negative.   Neurological: Negative.   Psychiatric/Behavioral: Negative.        Physical Exam  Pulse 73   Temp (!) 97.1 F (36.2 C)   SpO2 98% Comment: RA  Wt Readings from Last 5 Encounters:  06/02/20 168 lb (76.2 kg)  05/11/20 168 lb (76.2 kg)  01/17/20 167 lb (75.8 kg)   12/23/19 167 lb 3.2 oz (75.8 kg)  10/22/19 172 lb 14.4 oz (78.4 kg)     Physical Exam Vitals and nursing note reviewed.  Constitutional:      General: She is not in acute distress.    Appearance: She is well-developed and well-nourished.  Cardiovascular:     Rate and Rhythm: Normal rate and regular rhythm.  Pulmonary:     Effort: Pulmonary effort is normal.     Breath sounds: Normal breath sounds.  Musculoskeletal:     Right lower leg: No edema.     Left lower leg: No edema.  Neurological:     Mental Status: She is alert and oriented to person, place, and time.  Psychiatric:        Mood and Affect: Mood and affect and mood normal.        Behavior: Behavior normal.       Assessment & Plan:   History of COVID-19 Cough congestion:  Stay well hydrated  Stay active  Deep breathing exercises  May take tylenol or fever or pain  May take mucinex twice daily  May start zyrtec  May try magnesium for headaches  Will order chest x ray:  [facility] [address], [place] [address] [phone] MON - FRI 8:00 AM - 4:00 PM - WALK IN    Follow up:  Follow up if needed    Fenton Foy, NP 06/23/2020

## 2020-06-26 ENCOUNTER — Ambulatory Visit
Admission: RE | Admit: 2020-06-26 | Discharge: 2020-06-26 | Disposition: A | Discharge status: Home / Self Care | Payer: Medicare Other | Source: Ambulatory Visit | Attending: Nurse Practitioner | Admitting: Nurse Practitioner

## 2020-06-26 ENCOUNTER — Other Ambulatory Visit: Payer: Self-pay

## 2020-06-26 DIAGNOSIS — R0602 Shortness of breath: Secondary | ICD-10-CM | POA: Diagnosis not present

## 2020-06-29 ENCOUNTER — Other Ambulatory Visit: Payer: Self-pay

## 2020-06-30 ENCOUNTER — Telehealth: Payer: Self-pay | Admitting: Nurse Practitioner

## 2020-06-30 NOTE — Telephone Encounter (Signed)
-----   Message from Fenton Foy, NP sent at 06/29/2020  9:30 AM EST ----- Please call to let patient know that her chest xray was clear

## 2020-06-30 NOTE — Telephone Encounter (Signed)
Patient notified of results, verbally understood. No additional questions.

## 2020-07-02 ENCOUNTER — Encounter: Payer: Self-pay | Admitting: Family Medicine

## 2020-07-02 ENCOUNTER — Other Ambulatory Visit: Payer: Self-pay

## 2020-07-02 ENCOUNTER — Ambulatory Visit (INDEPENDENT_AMBULATORY_CARE_PROVIDER_SITE_OTHER): Payer: Medicare Other | Admitting: Family Medicine

## 2020-07-02 VITALS — BP 147/82 | HR 64 | Temp 98.2°F | Resp 16 | Ht 67.0 in | Wt 166.4 lb

## 2020-07-02 DIAGNOSIS — I700 Atherosclerosis of aorta: Secondary | ICD-10-CM

## 2020-07-02 DIAGNOSIS — Z8616 Personal history of COVID-19: Secondary | ICD-10-CM | POA: Diagnosis not present

## 2020-07-02 DIAGNOSIS — I10 Essential (primary) hypertension: Secondary | ICD-10-CM

## 2020-07-02 MED ORDER — LISINOPRIL 40 MG PO TABS: 40.0000 mg | ORAL_TABLET | Freq: Every day | ORAL | 1 refills | Status: DC

## 2020-07-02 MED ORDER — ASPIRIN 81 MG PO TBEC: 81.0000 mg | DELAYED_RELEASE_TABLET | Freq: Every day | ORAL | 12 refills | Status: DC

## 2020-07-02 NOTE — Progress Notes (Signed)
OFFICE VISIT  07/02/2020  CC:  Chief Complaint  Patient presents with  . Discuss chest x-ray results    Seen at post covid clinic on 1/18   HPI:    Patient is a 80 y.o. Caucasian female who presents for post-covid follow up. Covid resp illness 05/2020, prolonged symptoms.   I saw her for VV 06/18/20 and rx'd levaquin and referred her to post-covid resp clinic. 06/23/20 post covid resp clinic->pt was improving some s/p a course of levaquin, cxr ordered and this was obtained 06/26/20. CXR showed no acute abnormalities.  It did show some aortic atherosclerosis.  CURRENTLY: Getting better, mainly just lots of thin and clear nasal drainage/PND. No wheezing, minimal cough-->mainly PND cough in mornings. No fevers.  Not taking any antihistamine.  She is concerned about the finding of aortic atherosclerosis on her recent cxr. She does not monitor her bp at home.  ROS: no fevers, no CP, no SOB, no wheezing,no dizziness, no HAs, no face pain, no rashes, no melena/hematochezia.  No polyuria or polydipsia.  No myalgias or arthralgias.  No focal weakness, paresthesias, or tremors.  No acute vision or hearing abnormalities. No n/v/d or abd pain.  No palpitations.    Past Medical History:  Diagnosis Date  . Acute DVT (deep venous thrombosis) ([place]) 04/23/2019   Right popliteal  . Anxiety and depression 1964   oncologist started duloxetine 12/2016  . Breast cancer ([place]) 03/14/2016   Clinical stage 2A: (triple neg): Right breast, upper inner quadrant, 03/2016.  Neoadjuvant chemo x 5 cycles,lumpectomy 4 mo later, then RT started 10/2016.  Adjuvant Xeloda [id number].  SWOG research trial pt 04/2017--pt randomized to pembrolizumab immunotherapy.  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  [name] Kitchen Cataracts, bilateral 07/2017  . 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 COVID-19  virus infection 05/27/2020  . Depression 1964   Patient states since age 78  . Diabetes mellitus with complication ([place]) 3016   managed by endocrinology.  A1c Mar 12, 2018 was 7.0% at Dr. Shirlyn Goltz.   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  Excision done 02/2019  . GERD (gastroesophageal reflux disease) 2013  . Hyperlipidemia 1986  . Hypertension 2008  . Hypothyroidism 1988   Diagnosed in her 71s.  Managed by Endocrinologist  . Lumbar radiculopathy 04/2019   Dr. Tamala Julian to get plain films of LB and hip (considering MRI due to her hx of cancer)  . Osteoporosis 2015   pt states "osteopenia", but then says that she refused to take the rx med for this condition, so I suspect she had osteoporosis.  . Peripheral neuropathy 2017   Patient states diabetic neuropathy in feet prior to starting chemotherapy and then worsened by chemo.   [name] Kitchen TIA (transient ischemic attack) 03/26/2011    Past Surgical History:  Procedure Laterality Date  . ABDOMINAL HYSTERECTOMY  1972  . APPENDECTOMY  1972  . BREAST ENHANCEMENT SURGERY  1982  . BREAST IMPLANT REMOVAL Right 09/13/2016   Procedure: REMOVAL RIGHT BREAST IMPLANT;  Surgeon: Irene Limbo, MD;  Location: [facility];  Service: Plastics;  Laterality: Right;  . BREAST LUMPECTOMY WITH RADIOACTIVE SEED AND SENTINEL LYMPH NODE BIOPSY Right 09/13/2016   Procedure: RIGHT BREAST LUMPECTOMY WITH RADIOACTIVE SEED X 2 AND SENTINEL LYMPH NODE BIOPSY;  Surgeon: Alphonsa Overall, MD;  Location: [facility];  Service: General;  Laterality: Right;  . BREAST SURGERY Right 03/14/2016   Biopsy  . CAPSULECTOMY Right 09/13/2016   Procedure: RIGHT CAPSULECTOMY;  Surgeon: Irene Limbo, MD;  Location: [facility];  Service: Plastics;  Laterality: Right;  . CATARACT EXTRACTION, BILATERAL Bilateral 08/10/17 right eye, 08/31/17 left eye  . MASS EXCISION Left 02/28/2018   Path: benign.  Procedure: EXCISIONLEFT MEDIAL  THIGH MASS ERAS PATHWAY;  Surgeon: Erroll Luna, MD;  Location: [facility];  Service: General;  Laterality: Left;  . PORTACATH PLACEMENT N/A 03/15/2016   Procedure: INSERTION PORT-A-CATH WITH [place];  Surgeon: Alphonsa Overall, MD;  Location: [facility];  Service: General;  Laterality: N/A;  . PORTACATH REMOVAL  07/2017  . surgical repair left ankle Left 2009   s/p Fall   . TONSILLECTOMY AND ADENOIDECTOMY  1948   Age 60    Outpatient Medications Prior to Visit  Medication Sig Dispense Refill  . atorvastatin (LIPITOR) 80 MG tablet Take 80 mg by mouth every evening.     . DULoxetine (CYMBALTA) 30 MG capsule TAKE ONE CAPSULE BY MOUTH DAILY 30 capsule 1  . gabapentin (NEURONTIN) 300 MG capsule Take 1 capsule (300 mg total) by mouth See admin instructions. Takes 300 mg in the morning and 600 mg at bedtime. 270 capsule 3  . levothyroxine (SYNTHROID) 112 MCG tablet 112 mcg daily before breakfast.     . metFORMIN (GLUCOPHAGE-XR) 500 MG 24 hr tablet Take 1,000 mg by mouth daily before supper.     . Propylene Glycol (SYSTANE BALANCE OP) Place 1 drop into both eyes daily as needed (for dry eyes).    . traZODone (DESYREL) 50 MG tablet Take 1 tablet (50 mg total) by mouth at bedtime. 90 tablet 3  . lisinopril (ZESTRIL) 30 MG tablet Take 1 tablet (30 mg total) by mouth daily. 90 tablet 1  . benzonatate (TESSALON) 200 MG capsule Take 1 capsule (200 mg total) by mouth 2 (two) times daily as needed for cough. (Patient not taking: No sig reported) 20 capsule 0  . cholecalciferol (VITAMIN D3) 25 MCG (1000 UNIT) tablet Take 1,000 Units by mouth daily. (Patient not taking: No sig reported)    . guaiFENesin-codeine (ROBITUSSIN AC) 100-10 MG/5ML syrup Take 5 mLs by mouth 4 (four) times daily as needed for cough. (Patient not taking: Reported on 07/02/2020) 120 mL 0  . NONFORMULARY OR COMPOUNDED ITEM Estradiol vaginal cream 0.02% insert 1/4 of an applicator vaginally and thin layer vulva daily x 2 weeks.  Then  1/4 of an applicator vaginally and a thin layer on the vulva twice a week for long term (Patient not taking: No sig reported) 90 each 3  . levofloxacin (LEVAQUIN) 500 MG tablet Take 1 tablet (500 mg total) by mouth daily. (Patient not taking: Reported on 07/02/2020) 7 tablet 0   No facility-administered medications prior to visit.    Allergies  Allergen Reactions  . Other Other (See Comments)    STEROIDS- emotional  . Penicillins Other (See Comments)    Unsure of reaction, was 80 years old    ROS As per HPI  PE: Vitals with BMI 07/02/2020 06/23/2020 06/02/2020  Height 5\' 7"  - 5\' 7"   Weight 166 lbs 6 oz - 168 lbs  BMI [id number] - [id number]  Systolic [id number] - -  Diastolic 82 - -  Pulse 64 73 -  Manual bp repeat today at end of visit 140/80 Gen: Alert, well appearing.  Patient is oriented to  person, place, time, and situation. AFFECT: pleasant, lucid thought and speech. QZR:AQTM: no injection, icteris, swelling, or exudate.  EOMI, PERRLA. Mouth: lips without lesion/swelling.  Oral mucosa pink and moist. Oropharynx without erythema, exudate, or swelling.  Neck - No masses or thyromegaly or limitation in range of motion No carotid bruits. CV: RRR, no m/r/g.   LUNGS: CTA bilat, nonlabored resps, good aeration in all lung fields. EXT: no clubbing or cyanosis.  no edema.    LABS:    Chemistry      Component Value Date/Time   NA 138 05/11/2020 1150   NA 136 05/01/2017 0914   K 4.5 05/11/2020 1150   K 4.5 05/01/2017 0914   CL 104 05/11/2020 1150   CO2 28 05/11/2020 1150   CO2 24 05/01/2017 0914   BUN 15 05/11/2020 1150   BUN 14.9 05/01/2017 0914   CREATININE 0.82 05/11/2020 1150   CREATININE 0.8 05/01/2017 0914   GLU 181 09/20/2019 0000      Component Value Date/Time   CALCIUM 9.8 05/11/2020 1150   CALCIUM 9.6 05/01/2017 0914   ALKPHOS 62 05/11/2020 1150   ALKPHOS 75 05/01/2017 0914   AST 27 05/11/2020 1150   AST 51 (H) 05/01/2017 0914   ALT 34 05/11/2020 1150   ALT 62 (H)  05/01/2017 0914   BILITOT 1.6 (H) 05/11/2020 1150   BILITOT 1.30 (H) 05/01/2017 0914     Lab Results  Component Value Date   WBC 3.9 (L) 05/11/2020   HGB 13.5 05/11/2020   HCT 39.5 05/11/2020   MCV 95.9 05/11/2020   PLT 169 05/11/2020    IMPRESSION AND PLAN:  1) Covid resp infection, suspected superimposed bacterial pneumonia-->getting much better lately. Add zyrtec for PND.  2) Aortic atherosclerosis. RF modification/reduction emphasized: ADD ASA 81mg  qd. Cont statin, glucose control with endo, bp control (see #3).  3) HTN, not ideal control. Inc lisinopril to 40mg  qd. She'll contemplate getting home bp cuff. 1 mo f/u bp in office.  An After Visit Summary was printed and given to the patient.  FOLLOW UP: Return in about 4 weeks (around 07/30/2020) for f/u HTN.  Signed:  Crissie Sickles, MD           07/02/2020

## 2020-07-02 NOTE — Patient Instructions (Signed)
Start aspirin 81mg  tab once every day.  Buy generic over the counter zyrtec 10mg  and take one daily for nasal/sinus drainage.

## 2020-07-08 ENCOUNTER — Other Ambulatory Visit: Payer: Self-pay | Admitting: Family Medicine

## 2020-07-12 ENCOUNTER — Other Ambulatory Visit: Payer: Self-pay | Admitting: Family Medicine

## 2020-08-03 NOTE — Progress Notes (Signed)
Note duplication 

## 2020-08-04 ENCOUNTER — Other Ambulatory Visit: Payer: Self-pay

## 2020-08-04 ENCOUNTER — Ambulatory Visit: Payer: Self-pay

## 2020-08-04 ENCOUNTER — Ambulatory Visit (INDEPENDENT_AMBULATORY_CARE_PROVIDER_SITE_OTHER): Payer: Medicare Other

## 2020-08-04 ENCOUNTER — Encounter: Payer: Self-pay | Admitting: Family Medicine

## 2020-08-04 ENCOUNTER — Ambulatory Visit (INDEPENDENT_AMBULATORY_CARE_PROVIDER_SITE_OTHER): Payer: Medicare Other | Admitting: Family Medicine

## 2020-08-04 VITALS — BP 148/74 | HR 80 | Ht 67.0 in | Wt 164.4 lb

## 2020-08-04 DIAGNOSIS — M25561 Pain in right knee: Secondary | ICD-10-CM | POA: Diagnosis not present

## 2020-08-04 NOTE — Patient Instructions (Signed)
Thank you for coming in today.  Call or go to the ER if you develop a large red swollen joint with extreme pain or oozing puss.   Please use voltaren gel up to 4x daily for pain as needed.   Keep me updated.   If not improving.  Can consider PT.

## 2020-08-04 NOTE — Progress Notes (Signed)
I, Marissa Hall, LAT, ATC, am serving as scribe for Dr. Lynne Leader.  Marissa Hall is a 80 y.o. female who presents to [place] [facility] today for f/u of chronic R knee pain.  She was last seen by Dr. Tamala Julian concerning her R knee and lower leg on 04/23/19 and was found to have a DVT via doppler US.  She was then seen on 05/16/19 for her R hip and lower back.  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  She locates her pain to her R medial knee.  R knee swelling: yes R knee mechanical symptoms: No Aggravating factors: Walking; R LE weight bearing; climbing stairs; prolonged sitting; transitioning from sit-to-stand Treatments tried: IBU; Biofreeze; Hemp cream  Diagnostic testing: R knee and L-spine XR- 04/02/19  Pertinent review of systems: No fevers or chills  Relevant historical information: Hypertension and diabetes   Exam:  BP (!) 148/74 (BP Location: Left Arm, Patient Position: Sitting, Cuff Size: Normal)   Pulse 80   Ht 5\' 7"  (1.702 m)   Wt 164 lb 6.4 oz (74.6 kg)   SpO2 97%   BMI 25.75 kg/m  General: Well Developed, well nourished, and in no acute distress.   MSK: Right knee normal-appearing Normal motion.  Tender palpation medial femoral condyle and medial joint line. Stable ligamentous exam. Intact strength.    Lab and Radiology Results  Xray images right knee obtained today personally and independently interpreted Mild DJD.  No acute fractures. Await formal radiology review  Procedure: Real-time Ultrasound Guided Injection of right knee superior lateral patellar space Device: Philips Affiniti 50G Images permanently stored and available for review in PACS [place] evaluation prior to injection of mild joint effusion.  Degenerative medial meniscus with partial extruded medial meniscus. Verbal informed consent  obtained.  Discussed risks and benefits of procedure. Warned about infection bleeding damage to structures skin hypopigmentation and fat atrophy among others. Patient expresses understanding and agreement Time-out conducted.   Noted no overlying erythema, induration, or other signs of local infection.   Skin prepped in a sterile fashion.   Local anesthesia: Topical Ethyl chloride.   With sterile technique and under real time ultrasound guidance:  40 mg of Kenalog and 2 mL of Marcaine injected into knee joint. Fluid seen entering the joint capsule.   Completed without difficulty   Pain immediately resolved suggesting accurate placement of the medication.   Advised to call if fevers/chills, erythema, induration, drainage, or persistent bleeding.   Images permanently stored and available for review in the ultrasound unit.  Impression: Technically successful ultrasound guided injection.        Assessment and Plan: 80 y.o. female with right knee pain.  Thought to be due to exacerbation of DJD with possible degenerative meniscus tear.  Plan for steroid injection.  Also plan forVoltaren gel.  Consider physical therapy  PDMP not reviewed this encounter. Orders Placed This Encounter  Procedures  . [place] LIMITED JOINT SPACE STRUCTURES LOW RIGHT(NO LINKED CHARGES)    Order Specific Question:   Reason for Exam (SYMPTOM  OR DIAGNOSIS REQUIRED)    Answer:   R knee pain    Order Specific Question:   Preferred imaging location?    Answer:   [facility]  . DG Knee AP/LAT W/[place] Right  Standing Status:   Future    Number of Occurrences:   1    Standing Expiration Date:   08/04/2021    Order Specific Question:   Reason for Exam (SYMPTOM  OR DIAGNOSIS REQUIRED)    Answer:   eval knee    Order Specific Question:   Preferred imaging location?    Answer:   [facility]   No orders of the defined types were placed in this encounter.    Discussed warning signs or  symptoms. Please see discharge instructions. Patient expresses understanding.   The above documentation has been reviewed and is accurate and complete Lynne Leader, M.D.

## 2020-08-05 NOTE — Progress Notes (Signed)
X-ray right knee shows mild arthritis.

## 2020-09-21 DIAGNOSIS — H02886 Meibomian gland dysfunction of left eye, unspecified eyelid: Secondary | ICD-10-CM | POA: Insufficient documentation

## 2020-09-21 DIAGNOSIS — H02883 Meibomian gland dysfunction of right eye, unspecified eyelid: Secondary | ICD-10-CM | POA: Insufficient documentation

## 2020-09-21 DIAGNOSIS — H5201 Hypermetropia, right eye: Secondary | ICD-10-CM | POA: Diagnosis not present

## 2020-09-21 DIAGNOSIS — H0288B Meibomian gland dysfunction left eye, upper and lower eyelids: Secondary | ICD-10-CM | POA: Diagnosis not present

## 2020-09-21 DIAGNOSIS — H04123 Dry eye syndrome of bilateral lacrimal glands: Secondary | ICD-10-CM | POA: Diagnosis not present

## 2020-09-21 DIAGNOSIS — H02825 Cysts of left lower eyelid: Secondary | ICD-10-CM | POA: Diagnosis not present

## 2020-09-21 DIAGNOSIS — H0288A Meibomian gland dysfunction right eye, upper and lower eyelids: Secondary | ICD-10-CM | POA: Diagnosis not present

## 2020-09-21 DIAGNOSIS — E119 Type 2 diabetes mellitus without complications: Secondary | ICD-10-CM | POA: Diagnosis not present

## 2020-09-21 DIAGNOSIS — Z7984 Long term (current) use of oral hypoglycemic drugs: Secondary | ICD-10-CM | POA: Diagnosis not present

## 2020-09-21 DIAGNOSIS — H52201 Unspecified astigmatism, right eye: Secondary | ICD-10-CM | POA: Diagnosis not present

## 2020-09-21 DIAGNOSIS — H524 Presbyopia: Secondary | ICD-10-CM | POA: Diagnosis not present

## 2020-09-22 DIAGNOSIS — E1165 Type 2 diabetes mellitus with hyperglycemia: Secondary | ICD-10-CM | POA: Diagnosis not present

## 2020-09-22 DIAGNOSIS — E1169 Type 2 diabetes mellitus with other specified complication: Secondary | ICD-10-CM | POA: Diagnosis not present

## 2020-09-22 DIAGNOSIS — E1140 Type 2 diabetes mellitus with diabetic neuropathy, unspecified: Secondary | ICD-10-CM | POA: Diagnosis not present

## 2020-09-22 DIAGNOSIS — I152 Hypertension secondary to endocrine disorders: Secondary | ICD-10-CM | POA: Diagnosis not present

## 2020-09-22 DIAGNOSIS — E785 Hyperlipidemia, unspecified: Secondary | ICD-10-CM | POA: Diagnosis not present

## 2020-09-22 DIAGNOSIS — E039 Hypothyroidism, unspecified: Secondary | ICD-10-CM | POA: Diagnosis not present

## 2020-09-22 DIAGNOSIS — E1159 Type 2 diabetes mellitus with other circulatory complications: Secondary | ICD-10-CM | POA: Diagnosis not present

## 2020-10-10 ENCOUNTER — Other Ambulatory Visit: Payer: Self-pay | Admitting: Family Medicine

## 2020-10-28 ENCOUNTER — Other Ambulatory Visit: Payer: Self-pay | Admitting: *Deleted

## 2020-10-28 DIAGNOSIS — C50211 Malignant neoplasm of upper-inner quadrant of right female breast: Secondary | ICD-10-CM

## 2020-11-02 NOTE — Progress Notes (Signed)
Patient Care Team: Tammi Sou, MD as PCP - General (Family Medicine) Alphonsa Overall, MD as Consulting Physician (General Surgery) Nicholas Lose, MD as Consulting Physician (Hematology and Oncology) Kyung Rudd, MD as Consulting Physician (Radiation Oncology) Irene Limbo, MD as Consulting Physician (Plastic Surgery) Gerome Apley, MD as Consulting Physician (Endocrinology) Gardenia Phlegm, NP as Nurse Practitioner (Hematology and Oncology) Princess Bruins, MD as Consulting Physician (Obstetrics and Gynecology)  DIAGNOSIS:    ICD-10-CM   1. Malignant neoplasm of upper-inner quadrant of right breast in female, estrogen receptor negative ([place])  C50.211    Z17.1     SUMMARY OF ONCOLOGIC HISTORY: Oncology History  Breast cancer of upper-inner quadrant of right female breast ([place])  02/29/2016 Initial Diagnosis   Right breast palpable mass (with silicone implants 1324), 3.5 cm on MRI, additional 3 cm anterior linear enhancement? 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 Ki-67 60%, T2 N0 stage 2A clinical stage   03/14/2016 Procedure   Right breast biopsy upper inner quadrant: IDC grade 3   03/28/2016 -  Neo-Adjuvant Chemotherapy   Neoadjuvant chemotherapy with dose dense Adriamycin and Cytoxan followed by Abraxane weekly 5 ( patient is diabetic and cannot take steroids)   07/15/2016 Breast MRI   Right breast: Spiculated mass 1.5 cm significantly smaller compared to prior,NME previously seen is not noted, no abnormal lymph nodes   09/13/2016 Surgery   Removal of the silicone implant due to intracapsular rupture and capsulectomy (Dr.Thimappa)   09/13/2016 Surgery   Right lumpectomy: IDC grade 3, 2 foci, 2 cm and 1.1 cm, 0/3 lymph nodes negative margins negative, ER 0%, PR 0%, HER-2 negative ratio 1.02, Ki-67 60%, RCB-II; ypT2ypN0 Stage 2A    10/20/2016 - 12/06/2016 Radiation Therapy   Adjuvant radiation therapy   12/30/2016 - 04/05/2017 Chemotherapy   Xeloda  1000 mg by mouth twice a day adjuvant therapy x 4 cycles    05/03/2017 - 05/24/2017 Chemotherapy   SWOG S 1418 Pembrolizumab on clinical trial stopped after 2 doses by patient preference (not due to toxicities .)     CHIEF COMPLIANT: Follow-up of right breast cancer and DVT on Xarelto  INTERVAL HISTORY: Marissa Hall is a 80 y.o. with above-mentioned history of right breast cancer treated with neoadjuvant chemotherapy,lumpectomy,radiation, and is currently on surveillance. She also has a history of DVT currently on Xarelto.She presents to the clinic todayfor follow-up.  She has had no problems or concerns with her health.  Denies any lumps or nodules in the breast.  ALLERGIES:  is allergic to other and penicillins.  MEDICATIONS:  Current Outpatient Medications  Medication Sig Dispense Refill  . aspirin 81 MG EC tablet Take 1 tablet (81 mg total) by mouth daily. Swallow whole. 30 tablet 12  . atorvastatin (LIPITOR) 80 MG tablet Take 80 mg by mouth every evening.     . cholecalciferol (VITAMIN D3) 25 MCG (1000 UNIT) tablet Take 1,000 Units by mouth daily.    . DULoxetine (CYMBALTA) 30 MG capsule TAKE ONE CAPSULE BY MOUTH DAILY 30 capsule 5  . levothyroxine (SYNTHROID) 112 MCG tablet 112 mcg daily before breakfast.     . lisinopril (ZESTRIL) 40 MG tablet Take 1 tablet (40 mg total) by mouth daily. 30 tablet 0  . metFORMIN (GLUCOPHAGE-XR) 500 MG 24 hr tablet Take 1,000 mg by mouth daily before supper.     . Propylene Glycol (SYSTANE BALANCE OP) Place 1 drop into both eyes daily as needed (for dry eyes).    [name] Kitchen  traZODone (DESYREL) 50 MG tablet Take 1 tablet (50 mg total) by mouth at bedtime. 90 tablet 3   No current facility-administered medications for this visit.    PHYSICAL EXAMINATION: ECOG PERFORMANCE STATUS: 1 - Symptomatic but completely ambulatory  Vitals:   11/03/20 1144  BP: (!) 174/68  Pulse: (!) 56  Resp: 18  Temp: 97.7 F (36.5 C)  SpO2: 99%   Filed Weights    11/03/20 1144  Weight: 161 lb 9.6 oz (73.3 kg)       LABORATORY DATA:  I have reviewed the data as listed CMP Latest Ref Rng & Units 11/03/2020 05/11/2020 11/14/2019  Glucose 70 - 99 mg/dL 155(H) 166(H) 189(H)  BUN 8 - 23 mg/dL '17 15 22  ' Creatinine 0.44 - 1.00 mg/dL 0.83 0.82 0.91  Sodium 135 - 145 mmol/L 139 138 138  Potassium 3.5 - 5.1 mmol/L 4.7 4.5 4.2  Chloride 98 - 111 mmol/L 101 104 106  CO2 22 - 32 mmol/L '27 28 25  ' Calcium 8.9 - 10.3 mg/dL 10.2 9.8 9.2  Total Protein 6.5 - 8.1 g/dL 7.5 7.5 7.5  Total Bilirubin 0.3 - 1.2 mg/dL 1.4(H) 1.6(H) 1.4(H)  Alkaline Phos 38 - 126 U/L 65 62 58  AST 15 - 41 U/L '23 27 30  ' ALT 0 - 44 U/L 34 34 37    Lab Results  Component Value Date   WBC 4.8 11/03/2020   HGB 14.1 11/03/2020   HCT 42.6 11/03/2020   MCV 99.1 11/03/2020   PLT 171 11/03/2020   NEUTROABS 2.8 11/03/2020    ASSESSMENT & PLAN:  Breast cancer of upper-inner quadrant of right female breast ([place]) 02/29/2016: Right breast palpable mass (with silicone implants 1610), 3.5 cm on MRI, additional 3 cm anterior linear enhancement (biopsy 03/14/2016 IDC grade 3); 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  T2 N0 stage 2A clinical stage  Treatment summary: 1. Neoadjuvant chemotherapy with dose dense Adriamycin and Cytoxan 4 followed by Abraxane weekly 5(stopped early due to neuropathy) 2. 09/13/2016: Right lumpectomy: IDC grade 3, 2 foci, 2 cm and 1.1 cm, 0/3 lymph nodes negative margins negative, ER 0%, PR 0%, HER-2 negative ratio 1.02, Ki-67 60%, RCB-II; ypT2ypN0 Stage 2A (with plastic surgery removing the ruptured implant) 3. Followed by radiation therapy completed 12/06/2016 4.Adjuvant Xeloda 1000 mg by mouth twice a day 2 weeks on one week off 12/30/2016-Oct 2018 5.SWOG 1418 clinical trial Pembrolizumabcycle 1 given 05/03/2017(patient decided to discontinue clinical  trial) ----------------------------------------------------------------------------------------------------------------------------------------- Surveillance: 1.Breast exam5/31/2022: Benign, no palpable lumps or nodules of concern. 2.mammogram11/4/2021at [facility]: No evidence of malignancy. Breast density categoryB  Chronic fatigue Chemo-induced peripheral neuropathy: Symptoms are mild.  Diabetes: Patient is trying to adjust her diet and cut down her sugar intake. No clinical or radiological findings of breast cancer recurrence. Return to clinic in 6 months with labs and follow-up    No orders of the defined types were placed in this encounter.  The patient has a good understanding of the overall plan. she agrees with it. she will call with any problems that may develop before the next visit here.  Total time spent: 20 mins including face to face time and time spent for planning, charting and coordination of care  Rulon Eisenmenger, MD, MPH 11/03/2020  I, Cloyde Reams Dorshimer, am acting as scribe for Dr. Nicholas Lose.  I have reviewed the above documentation for accuracy and completeness, and I agree with the above.

## 2020-11-03 ENCOUNTER — Inpatient Hospital Stay: Payer: Medicare Other

## 2020-11-03 ENCOUNTER — Encounter: Payer: Self-pay | Admitting: *Deleted

## 2020-11-03 ENCOUNTER — Other Ambulatory Visit: Payer: Self-pay

## 2020-11-03 ENCOUNTER — Inpatient Hospital Stay: Payer: Medicare Other | Attending: Hematology and Oncology | Admitting: Hematology and Oncology

## 2020-11-03 ENCOUNTER — Encounter: Payer: Self-pay | Admitting: Hematology and Oncology

## 2020-11-03 DIAGNOSIS — Z7901 Long term (current) use of anticoagulants: Secondary | ICD-10-CM | POA: Insufficient documentation

## 2020-11-03 DIAGNOSIS — R5382 Chronic fatigue, unspecified: Secondary | ICD-10-CM | POA: Diagnosis not present

## 2020-11-03 DIAGNOSIS — Z86718 Personal history of other venous thrombosis and embolism: Secondary | ICD-10-CM | POA: Insufficient documentation

## 2020-11-03 DIAGNOSIS — Z171 Estrogen receptor negative status [ER-]: Secondary | ICD-10-CM | POA: Diagnosis not present

## 2020-11-03 DIAGNOSIS — C50211 Malignant neoplasm of upper-inner quadrant of right female breast: Secondary | ICD-10-CM | POA: Diagnosis not present

## 2020-11-03 DIAGNOSIS — Z9221 Personal history of antineoplastic chemotherapy: Secondary | ICD-10-CM | POA: Insufficient documentation

## 2020-11-03 DIAGNOSIS — Z006 Encounter for examination for normal comparison and control in clinical research program: Secondary | ICD-10-CM | POA: Insufficient documentation

## 2020-11-03 DIAGNOSIS — T451X5A Adverse effect of antineoplastic and immunosuppressive drugs, initial encounter: Secondary | ICD-10-CM | POA: Insufficient documentation

## 2020-11-03 DIAGNOSIS — E119 Type 2 diabetes mellitus without complications: Secondary | ICD-10-CM | POA: Diagnosis not present

## 2020-11-03 DIAGNOSIS — G620 Drug-induced polyneuropathy: Secondary | ICD-10-CM | POA: Diagnosis not present

## 2020-11-03 DIAGNOSIS — Z923 Personal history of irradiation: Secondary | ICD-10-CM | POA: Insufficient documentation

## 2020-11-03 LAB — CBC WITH DIFFERENTIAL (CANCER CENTER ONLY)
Abs Immature Granulocytes: 0.01 10*3/uL (ref 0.00–0.07)
Basophils Absolute: 0.1 10*3/uL (ref 0.0–0.1)
Basophils Relative: 1 %
Eosinophils Absolute: 0.1 10*3/uL (ref 0.0–0.5)
Eosinophils Relative: 2 %
HCT: 42.6 % (ref 36.0–46.0)
Hemoglobin: 14.1 g/dL (ref 12.0–15.0)
Immature Granulocytes: 0 %
Lymphocytes Relative: 29 %
Lymphs Abs: 1.4 10*3/uL (ref 0.7–4.0)
MCH: 32.8 pg (ref 26.0–34.0)
MCHC: 33.1 g/dL (ref 30.0–36.0)
MCV: 99.1 fL (ref 80.0–100.0)
Monocytes Absolute: 0.4 10*3/uL (ref 0.1–1.0)
Monocytes Relative: 9 %
Neutro Abs: 2.8 10*3/uL (ref 1.7–7.7)
Neutrophils Relative %: 59 %
Platelet Count: 171 10*3/uL (ref 150–400)
RBC: 4.3 MIL/uL (ref 3.87–5.11)
RDW: 13.5 % (ref 11.5–15.5)
WBC Count: 4.8 10*3/uL (ref 4.0–10.5)
nRBC: 0 % (ref 0.0–0.2)

## 2020-11-03 LAB — CMP (CANCER CENTER ONLY)
ALT: 34 U/L (ref 0–44)
AST: 23 U/L (ref 15–41)
Albumin: 4.3 g/dL (ref 3.5–5.0)
Alkaline Phosphatase: 65 U/L (ref 38–126)
Anion gap: 11 (ref 5–15)
BUN: 17 mg/dL (ref 8–23)
CO2: 27 mmol/L (ref 22–32)
Calcium: 10.2 mg/dL (ref 8.9–10.3)
Chloride: 101 mmol/L (ref 98–111)
Creatinine: 0.83 mg/dL (ref 0.44–1.00)
GFR, Estimated: >60 mL/min (ref >60)
Glucose, Bld: 155 mg/dL — ABNORMAL HIGH (ref 70–99)
Potassium: 4.7 mmol/L (ref 3.5–5.1)
Sodium: 139 mmol/L (ref 135–145)
Total Bilirubin: 1.4 mg/dL — ABNORMAL HIGH (ref 0.3–1.2)
Total Protein: 7.5 g/dL (ref 6.5–8.1)

## 2020-11-03 NOTE — Research (Signed)
42 Months Follow Up Visit for [id number] Clinical Trial:  Patient into clinic today to see Dr. Lindi Adie for 42 month study visit.   Labs;Completed per protocol and reviewed by Dr. Lindi Adie. Concomitant medicationsreviewedwith patient;Reviewed list with patient. She has stopped taking Gabapentin and Estradiol. Medication list updated.  Patient denies any new medications.  H&P, PS, Toxicity and Recurrence assessment;  Completed by Dr. Lindi Adie. See MD encounter note from today.  Thanked patient very much for her time today and ongoingparticipation in this study. Her next study visit is due 05/01/21 (+/- 4 weeks) and will be scheduled. Asked patient to call research nurse if any questions or problems prior to next study visit.  She verbalized understanding.  Foye Spurling, BSN, RN Clinical Research Nurse 11/03/2020

## 2020-11-03 NOTE — Assessment & Plan Note (Addendum)
02/29/2016: Right breast palpable mass (with silicone implants 8657), 3.5 cm on MRI, additional 3 cm anterior linear enhancement (biopsy 03/14/2016 IDC grade 3); 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  T2 N0 stage 2A clinical stage  Treatment summary: 1. Neoadjuvant chemotherapy with dose dense Adriamycin and Cytoxan 4 followed by Abraxane weekly 5(stopped early due to neuropathy) 2. 09/13/2016: Right lumpectomy: IDC grade 3, 2 foci, 2 cm and 1.1 cm, 0/3 lymph nodes negative margins negative, ER 0%, PR 0%, HER-2 negative ratio 1.02, Ki-67 60%, RCB-II; ypT2ypN0 Stage 2A (with plastic surgery removing the ruptured implant) 3. Followed by radiation therapy completed 12/06/2016 4.Adjuvant Xeloda 1000 mg by mouth twice a day 2 weeks on one week off 12/30/2016-Oct 2018 5.SWOG 1418 clinical trial Pembrolizumabcycle 1 given 05/03/2017(patient decided to discontinue clinical trial) ----------------------------------------------------------------------------------------------------------------------------------------- Surveillance: 1.Breast exam5/31/2022: Benign, no palpable lumps or nodules of concern. 2.mammogram11/4/2021at [facility]: No evidence of malignancy. Breast density categoryB  Chronic fatigue Chemo-induced peripheral neuropathy: Symptoms are mild.  Diabetes: Patient is trying to adjust her diet and cut down her sugar intake. No clinical or radiological findings of breast cancer recurrence. Return to clinic in 6 months with labs and follow-up

## 2020-11-04 ENCOUNTER — Telehealth: Payer: Self-pay | Admitting: Hematology and Oncology

## 2020-11-04 NOTE — Telephone Encounter (Signed)
Scheduled per 5/31 los. Called and spoke with pt, confirmed 11/15 appt

## 2020-11-23 ENCOUNTER — Telehealth: Payer: Self-pay | Admitting: Family Medicine

## 2020-11-23 ENCOUNTER — Other Ambulatory Visit: Payer: Self-pay | Admitting: *Deleted

## 2020-11-23 MED ORDER — NONFORMULARY OR COMPOUNDED ITEM: 0 refills | Status: DC

## 2020-11-23 MED ORDER — ESTRADIOL 0.1 MG/GM VA CREA: TOPICAL_CREAM | VAGINAL | 0 refills | Status: DC

## 2020-11-23 NOTE — Telephone Encounter (Addendum)
[facility]issa Hall pharmacist called back and said that [facility] is no longer making compound estradiol vaginal cream 0.02% cream only. The patients can now have estrace vaginal cream 0.1 mg/gm sent to [facility] with same directions 1 gram vaginally twice weekly.  I will send Rx into pharmacy. Just FYI

## 2020-11-23 NOTE — Addendum Note (Signed)
Addended by: Thamas Jaegers on: 11/23/2020 12:12 PM   Modules accepted: Orders

## 2020-11-23 NOTE — Telephone Encounter (Signed)
Annual exam scheduled on 12/08/20, Rx called into [place] [facility].

## 2020-11-23 NOTE — Telephone Encounter (Signed)
Left message for patient to schedule Annual Wellness Visit.  Please schedule with Nurse Health Advisor Leroy Kennedy, RN at [facility].

## 2020-12-01 ENCOUNTER — Encounter: Payer: Self-pay | Admitting: Hematology and Oncology

## 2020-12-08 ENCOUNTER — Ambulatory Visit: Payer: Medicare Other | Admitting: Obstetrics & Gynecology

## 2020-12-14 ENCOUNTER — Encounter: Payer: Self-pay | Admitting: Family Medicine

## 2020-12-14 ENCOUNTER — Ambulatory Visit (INDEPENDENT_AMBULATORY_CARE_PROVIDER_SITE_OTHER): Payer: Medicare Other | Admitting: Family Medicine

## 2020-12-14 VITALS — BP 142/82 | HR 63 | Temp 98.6°F | Ht 68.5 in | Wt 164.0 lb

## 2020-12-14 DIAGNOSIS — N3000 Acute cystitis without hematuria: Secondary | ICD-10-CM | POA: Diagnosis not present

## 2020-12-14 LAB — POC URINALSYSI DIPSTICK (AUTOMATED)
Bilirubin, UA: NEGATIVE
Blood, UA: NEGATIVE
Glucose, UA: NEGATIVE
Ketones, UA: NEGATIVE
Nitrite, UA: POSITIVE
Protein, UA: NEGATIVE
Spec Grav, UA: 1.02 (ref 1.010–1.025)
Urobilinogen, UA: 0.2 E.U./dL
pH, UA: 5.5 (ref 5.0–8.0)

## 2020-12-14 MED ORDER — CEPHALEXIN 500 MG PO CAPS: 500.0000 mg | ORAL_CAPSULE | Freq: Three times a day (TID) | ORAL | 0 refills | Status: DC

## 2020-12-14 NOTE — Progress Notes (Signed)
Chief Complaint  Patient presents with   Dysuria    Abdominal pain     Marissa Hall is a 80 y.o. female here for possible UTI.  Duration: 3 days. Symptoms: Dysuria, urinary frequency, nausea, and urgency Denies: hematuria, urinary hesitancy, urinary retention, fever, vomiting, and flank pain, vaginal discharge Hx of recurrent UTI? No  Past Medical History:  Diagnosis Date   Acute DVT (deep venous thrombosis) ([place]) 04/23/2019   Right popliteal   Anxiety and depression 1964   oncologist started duloxetine 12/2016   Breast cancer ([place]) 03/14/2016   Clinical stage 2A: (triple neg): Right breast, upper inner quadrant, 03/2016.  Neoadjuvant chemo x 5 cycles,lumpectomy 4 mo later, then RT started 10/2016.  Adjuvant Xeloda [id number].  SWOG research trial pt 04/2017--pt randomized to pembrolizumab immunotherapy.  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  A1c Mar 12, 2018 was 7.0% at Dr. Shirlyn Goltz.    Epidermoid cyst of vulva    Chronic epidermoid cyst of the vulva.  Excision done 02/2019   GERD (gastroesophageal reflux disease) 2013   Hyperlipidemia 1986   Hypertension 2008   Hypothyroidism 1988   Diagnosed in her 54s.  Managed by Endocrinologist   Lumbar radiculopathy 04/2019   Dr. Tamala Julian to get plain films of LB and hip (considering MRI due to her hx of cancer)   Osteoporosis 2015   pt states "osteopenia", but then says that she refused to take the rx med for this condition, so I suspect she had osteoporosis.   Peripheral neuropathy 2017   Patient states diabetic neuropathy in feet prior to starting chemotherapy and then  worsened by chemo.    TIA (transient ischemic attack) 03/26/2011   2012: question of (HA + R eye "floaters"). CT in ED neg acute. Not admitted, no f/u testing done.  ?ocular migraine?     BP (!) 142/82   Pulse 63   Temp 98.6 F (37 C) (Oral)   Ht 5' 8.5" (1.74 m)   Wt 164 lb (74.4 kg)   SpO2 93%   BMI 24.57 kg/m  General: Awake, alert, appears stated age Heart: RRR Lungs: CTAB, normal respiratory effort, no accessory muscle usage Abd: BS+, soft, mild ttp in suprapubic region, ND, no masses or organomegaly MSK: No CVA tenderness, neg Lloyd's sign Psych: Age appropriate judgment and insight  Acute cystitis without hematuria - Plan: cephALEXin (KEFLEX) 500 MG capsule, POCT Urinalysis Dipstick (Automated), Urine Culture  Stay hydrated. Seek immediate care if pt starts to develop fevers, new/worsening symptoms, uncontrollable N/V. F/u prn. The patient voiced understanding and agreement to the plan.  [place], DO 12/14/20 4:47 PM

## 2020-12-14 NOTE — Patient Instructions (Signed)
Stay hydrated.   Warning signs/symptoms: Uncontrollable nausea/vomiting, fevers, worsening symptoms despite treatment, confusion.  Give us around 2 business days to get culture back to you.  Let us know if you need anything. 

## 2020-12-16 LAB — URINE CULTURE
MICRO NUMBER:: 12103289
SPECIMEN QUALITY:: ADEQUATE

## 2020-12-25 ENCOUNTER — Other Ambulatory Visit: Payer: Self-pay | Admitting: Family Medicine

## 2020-12-29 ENCOUNTER — Telehealth: Payer: Self-pay | Admitting: Family Medicine

## 2020-12-29 NOTE — Telephone Encounter (Signed)
Left message for patient to call back and schedule Medicare Annual Wellness Visit (AWV).   Please offer to do virtually or by telephone.   Due for AWVI  Please schedule at anytime with Nurse Health Advisor.   

## 2020-12-30 ENCOUNTER — Encounter: Payer: Self-pay | Admitting: Family Medicine

## 2020-12-30 ENCOUNTER — Other Ambulatory Visit: Payer: Self-pay

## 2020-12-30 ENCOUNTER — Ambulatory Visit (INDEPENDENT_AMBULATORY_CARE_PROVIDER_SITE_OTHER): Payer: Medicare Other | Admitting: Family Medicine

## 2020-12-30 ENCOUNTER — Telehealth: Payer: Self-pay | Admitting: Family Medicine

## 2020-12-30 VITALS — BP 148/78 | HR 66 | Temp 98.3°F | Ht 69.0 in | Wt 164.2 lb

## 2020-12-30 DIAGNOSIS — E119 Type 2 diabetes mellitus without complications: Secondary | ICD-10-CM

## 2020-12-30 DIAGNOSIS — T50905A Adverse effect of unspecified drugs, medicaments and biological substances, initial encounter: Secondary | ICD-10-CM | POA: Diagnosis not present

## 2020-12-30 DIAGNOSIS — R300 Dysuria: Secondary | ICD-10-CM

## 2020-12-30 MED ORDER — METFORMIN HCL ER 500 MG PO TB24: 1000.0000 mg | ORAL_TABLET | Freq: Two times a day (BID) | ORAL | Status: DC

## 2020-12-30 MED ORDER — CEPHALEXIN 500 MG PO CAPS
500.0000 mg | ORAL_CAPSULE | Freq: Three times a day (TID) | ORAL | 0 refills | Status: AC
Start: 2020-12-30 — End: 2021-01-06

## 2020-12-30 NOTE — Patient Instructions (Addendum)
Stay hydrated.   Warning signs/symptoms: Uncontrollable nausea/vomiting, fevers, worsening symptoms despite treatment, confusion.  Give [place] around 2 business days to get culture back to you.  Start taking 1 tab twice daily of your metformin. If you are still having diarrhea, drop down to 1 tab daily. I would recommend reaching out to your endocrinologist as well.   If you do not hear anything about your referral in the next 1-2 weeks, call our office and ask for an update.  Let us know if you need anything.

## 2020-12-30 NOTE — Telephone Encounter (Signed)
Rec she be seen again. I can see her at 63 today or if someone else has an opening that would be better suited for her, that is fine. Ty.

## 2020-12-30 NOTE — Telephone Encounter (Signed)
Patient saw Dr. Nani Ravens on 07/11 states she was treated for UTI, However sxs has returned. She called her PCP and was told to call back here due to [facility] being booked all week. Please advise if he can call in something else or if she needs to be seen again.

## 2020-12-30 NOTE — Telephone Encounter (Signed)
Called the patient and she agreed to this time slot. She will be added on today.

## 2020-12-30 NOTE — Progress Notes (Signed)
Chief Complaint  Patient presents with   Urinary Tract Infection   Dysuria    Marissa Hall is a 80 y.o. female here for possible UTI.  Duration: 1 day. This was Symptoms: Dysuria, urinary frequency and urgency Denies: hematuria, urinary hesitancy, urinary retention, fever, nausea, vomiting, flank pain, vaginal discharge Hx of recurrent UTI? No Tried Azo.  Denies new sexual partners.  The patient has a history of diabetes and is following with endocrinology team through [facility].  She is interested in seeing an endocrinologist through [facility].  She is taking metformin extended release 1000 mg nightly.  She will get sporadic diarrhea from it.  She was never taking 500 mg.  She states her endocrinologist is aware of the side effect for her.  Past Medical History:  Diagnosis Date   Acute DVT (deep venous thrombosis) ([place]) 04/23/2019   Right popliteal   Anxiety and depression 1964   oncologist started duloxetine 12/2016   Breast cancer ([place]) 03/14/2016   Clinical stage 2A: (triple neg): Right breast, upper inner quadrant, 03/2016.  Neoadjuvant chemo x 5 cycles,lumpectomy 4 mo later, then RT started 10/2016.  Adjuvant Xeloda [id number].  SWOG research trial pt 04/2017--pt randomized to pembrolizumab immunotherapy.  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  A1c Mar 12, 2018 was 7.0% at Dr. Shirlyn Goltz.    Epidermoid cyst of vulva    Chronic epidermoid cyst of the vulva.  Excision done 02/2019   GERD (gastroesophageal reflux disease) 2013   Hyperlipidemia 1986   Hypertension 2008   Hypothyroidism 1988   Diagnosed in her 75s.   Managed by Endocrinologist   Lumbar radiculopathy 04/2019   Dr. Tamala Julian to get plain films of LB and hip (considering MRI due to her hx of cancer)   Osteoporosis 2015   pt states "osteopenia", but then says that she refused to take the rx med for this condition, so I suspect she had osteoporosis.   Peripheral neuropathy 2017   Patient states diabetic neuropathy in feet prior to starting chemotherapy and then worsened by chemo.    TIA (transient ischemic attack) 03/26/2011   2012: question of (HA + R eye "floaters"). CT in ED neg acute. Not admitted, no f/u testing done.  ?ocular migraine?     BP (!) 148/78   Pulse 66   Temp 98.3 F (36.8 C) (Oral)   Ht '5\' 9"'$  (1.753 m)   Wt 164 lb 4 oz (74.5 kg)   SpO2 94%   BMI 24.26 kg/m  General: Awake, alert, appears stated age Heart: RRR Lungs: CTAB, normal respiratory effort, no accessory muscle usage Abd: BS+, soft, mild suprapubic tenderness to palpation, ND, no masses or organomegaly MSK: No CVA tenderness, neg Lloyd's sign Psych: Age appropriate judgment and insight  Dysuria - Plan: cephALEXin (KEFLEX) 500 MG capsule, Urine Culture  Diabetes mellitus type 2 without retinopathy ([place]) - Plan: Ambulatory referral to Endocrinology  Adverse effect of drug, initial encounter  Stay hydrated, check culture, start Keflex 3 times daily for a week. Seek immediate care if pt starts to develop fevers, new/worsening symptoms, uncontrollable N/V. Refer to endocrinology at her request  to [facility]. Adverse effect of drug use for chronic condition.  Recommended she take 1 tab twice daily and see if this helps.  Could cut down to 1 tab daily or stop altogether depending on how she does. F/u prn. The patient voiced understanding and agreement to the plan.  [place], DO 12/30/20 3:25 PM

## 2021-01-01 LAB — URINE CULTURE
MICRO NUMBER:: 12169534
SPECIMEN QUALITY:: ADEQUATE

## 2021-01-04 HISTORY — PX: US CAROTID DOPPLER BILATERAL (ARMC HX): HXRAD1402

## 2021-01-05 ENCOUNTER — Ambulatory Visit
Admission: EM | Admit: 2021-01-05 | Discharge: 2021-01-05 | Disposition: A | Discharge status: Home / Self Care | Payer: Medicare Other | Attending: Emergency Medicine | Admitting: Emergency Medicine

## 2021-01-05 ENCOUNTER — Ambulatory Visit (HOSPITAL_BASED_OUTPATIENT_CLINIC_OR_DEPARTMENT_OTHER)
Admission: RE | Admit: 2021-01-05 | Discharge: 2021-01-05 | Disposition: A | Discharge status: Home / Self Care | Payer: Medicare Other | Source: Ambulatory Visit | Attending: Emergency Medicine | Admitting: Emergency Medicine

## 2021-01-05 ENCOUNTER — Other Ambulatory Visit: Payer: Self-pay

## 2021-01-05 ENCOUNTER — Encounter: Payer: Self-pay | Admitting: Hematology and Oncology

## 2021-01-05 DIAGNOSIS — R0781 Pleurodynia: Secondary | ICD-10-CM | POA: Insufficient documentation

## 2021-01-05 DIAGNOSIS — Y929 Unspecified place or not applicable: Secondary | ICD-10-CM | POA: Insufficient documentation

## 2021-01-05 DIAGNOSIS — M546 Pain in thoracic spine: Secondary | ICD-10-CM | POA: Diagnosis not present

## 2021-01-05 DIAGNOSIS — Y939 Activity, unspecified: Secondary | ICD-10-CM | POA: Insufficient documentation

## 2021-01-05 DIAGNOSIS — W19XXXA Unspecified fall, initial encounter: Secondary | ICD-10-CM | POA: Diagnosis not present

## 2021-01-05 MED ORDER — DICLOFENAC SODIUM 1 % EX GEL: 2.0000 g | Freq: Four times a day (QID) | CUTANEOUS | 0 refills | Status: DC

## 2021-01-05 MED ORDER — IBUPROFEN 600 MG PO TABS: 600.0000 mg | ORAL_TABLET | Freq: Four times a day (QID) | ORAL | 0 refills | Status: DC | PRN

## 2021-01-05 NOTE — ED Triage Notes (Signed)
Pt seen in UC w/ c/o back pain from fall that occurred on Sunday. Pt states she fell when getting off of the commode. Pt endorses feeling dizzy today.

## 2021-01-05 NOTE — Discharge Instructions (Addendum)
Please go for x-ray-I will call with results Tylenol/ibuprofen Diclofenac topically to area May try over-the-counter lidocaine patches Alternate ice and heat Follow-up if not improving or worsening

## 2021-01-06 NOTE — ED Provider Notes (Signed)
[facility]    CSN: [id number] Arrival date & time: 01/05/21  1527      History   Chief Complaint Chief Complaint  Patient presents with   Back Pain    From fall    HPI Marissa Hall is a 80 y.o. female history of GERD, hypertension, presenting today for evaluation of back pain after fall.  Patient reports that 2 days ago she was standing up from using the bathroom and lost her footing ended up falling and hit her left lower back on the edge of the tub/shower.  Since she has had pain to her left mid/lower back.  Pain with twisting, improves with stretching.  Denies any difficulty breathing, but does report pain with deep inspiration.  Denies use of blood thinners.  Denies hitting head or loss of consciousness.  Denies any changes in urination, hematuria, hesitancy, urgency.  Denies abdominal pain.  HPI  Past Medical History:  Diagnosis Date   Acute DVT (deep venous thrombosis) ([place]) 04/23/2019   Right popliteal   Anxiety and depression 1964   oncologist started duloxetine 12/2016   Breast cancer ([place]) 03/14/2016   Clinical stage 2A: (triple neg): Right breast, upper inner quadrant, 03/2016.  Neoadjuvant chemo x 5 cycles,lumpectomy 4 mo later, then RT started 10/2016.  Adjuvant Xeloda [id number].  SWOG research trial pt 04/2017--pt randomized to pembrolizumab immunotherapy.  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  A1c Mar 12, 2018 was 7.0% at Dr. Shirlyn Goltz.    Epidermoid cyst of vulva    Chronic epidermoid cyst of the vulva.  Excision done 02/2019   GERD (gastroesophageal reflux disease) 2013    Hyperlipidemia 1986   Hypertension 2008   Hypothyroidism 1988   Diagnosed in her 34s.  Managed by Endocrinologist   Lumbar radiculopathy 04/2019   Dr. Tamala Julian to get plain films of LB and hip (considering MRI due to her hx of cancer)   Osteoporosis 2015   pt states "osteopenia", but then says that she refused to take the rx med for this condition, so I suspect she had osteoporosis.   Peripheral neuropathy 2017   Patient states diabetic neuropathy in feet prior to starting chemotherapy and then worsened by chemo.    TIA (transient ischemic attack) 03/26/2011   2012: question of (HA + R eye "floaters"). CT in ED neg acute. Not admitted, no f/u testing done.  ?ocular migraine?    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/21/2020   History of COVID-19 06/23/2020   Piriformis syndrome of right side 05/16/2019   Lower extremity pain, right 04/23/2019   DVT (deep venous thrombosis) ([place]) 04/23/2019   Lumbar radiculopathy, right 04/02/2019   Other long term (current) drug therapy 06/14/2017   Anxiety and depression    History of breast cancer 03/02/2017   History of therapeutic radiation 03/02/2017   Breast pain, right 10/03/2016   Type 2 diabetes mellitus ([place]) 08/23/2016   Hypertension associated with diabetes ([place]) 08/23/2016  Hypothyroidism 08/23/2016   Port catheter in place 05/09/2016   Peripheral neuropathy 05/09/2016   Breast cancer of upper-inner quadrant of right female breast ([place]) 03/09/2016   Major depressive disorder, recurrent ([place]) [id number]   Uncomplicated alcohol dependence ([place]) 02/19/2014    Past Surgical History:  Procedure Laterality Date   ABDOMINAL HYSTERECTOMY  1972   APPENDECTOMY  1972   BREAST ENHANCEMENT SURGERY  1982   BREAST IMPLANT REMOVAL Right 09/13/2016   Procedure: REMOVAL RIGHT BREAST  IMPLANT;  Surgeon: Irene Limbo, MD;  Location: [facility];  Service: Plastics;  Laterality: Right;   BREAST LUMPECTOMY WITH RADIOACTIVE SEED AND SENTINEL LYMPH NODE BIOPSY Right 09/13/2016   Procedure: RIGHT BREAST LUMPECTOMY WITH RADIOACTIVE SEED X 2 AND SENTINEL LYMPH NODE BIOPSY;  Surgeon: Alphonsa Overall, MD;  Location: [facility];  Service: General;  Laterality: Right;   BREAST SURGERY Right 03/14/2016   Biopsy   CAPSULECTOMY Right 09/13/2016   Procedure: RIGHT CAPSULECTOMY;  Surgeon: Irene Limbo, MD;  Location: [facility];  Service: Plastics;  Laterality: Right;   CATARACT EXTRACTION, BILATERAL Bilateral 08/10/17 right eye, 08/31/17 left eye   MASS EXCISION Left 02/28/2018   Path: benign.  Procedure: EXCISIONLEFT MEDIAL THIGH MASS ERAS PATHWAY;  Surgeon: Erroll Luna, MD;  Location: [facility];  Service: General;  Laterality: Left;   PORTACATH PLACEMENT N/A 03/15/2016   Procedure: INSERTION PORT-A-CATH WITH [place];  Surgeon: Alphonsa Overall, MD;  Location: [facility];  Service: General;  Laterality: N/A;   PORTACATH REMOVAL  07/2017   surgical repair left ankle Left 2009   s/p [place]   Age 19    OB History     Gravida  1   Para  1   Term      Preterm      AB      Living  1      SAB      IAB      Ectopic      Multiple      Live Births               Home Medications    Prior to Admission medications   Medication Sig Start Date End Date Taking? Authorizing Provider  diclofenac Sodium (VOLTAREN) 1 % GEL Apply 2 g topically 4 (four) times daily. 01/05/21  Yes Sameerah Nachtigal C, PA-C  ibuprofen (ADVIL) 600 MG tablet Take 1 tablet (600 mg total) by mouth every 6 (six) hours as needed. 01/05/21  Yes Marcia Lepera C, PA-C  aspirin 81 MG EC tablet Take 1 tablet (81 mg total) by mouth daily. Swallow whole. 07/02/20   McGowen, Adrian Blackwater, MD  atorvastatin (LIPITOR) 80 MG tablet  Take 80 mg by mouth every evening.  02/12/16   [provider]  cephALEXin (KEFLEX) 500 MG capsule Take 1 capsule (500 mg total) by mouth 3 (three) times daily for 7 days. 12/30/20 01/06/21  Shelda Pal, DO  cholecalciferol (VITAMIN D3) 25 MCG (1000 UNIT) tablet Take 1,000 Units by mouth daily.    [provider]  DULoxetine (CYMBALTA) 30 MG capsule TAKE ONE CAPSULE BY MOUTH DAILY 07/08/20   McGowen, Adrian Blackwater, MD  estradiol (ESTRACE VAGINAL) 0.1 MG/GM vaginal cream Insert 1 gram vaginally twice weekly 11/23/20   Princess Bruins, MD  levothyroxine (SYNTHROID) 112 MCG tablet 112 mcg daily before breakfast.  06/20/19   [provider]  lisinopril (ZESTRIL) 40 MG  tablet Take 1 tablet (40 mg total) by mouth daily. 12/28/20   McGowen, Adrian Blackwater, MD  metFORMIN (GLUCOPHAGE-XR) 500 MG 24 hr tablet Take 2 tablets (1,000 mg total) by mouth in the morning and at bedtime. 12/30/20   Shelda Pal, DO  Propylene Glycol (SYSTANE BALANCE OP) Place 1 drop into both eyes daily as needed (for dry eyes). Patient not taking: Reported on 01/05/2021    [provider]  traZODone (DESYREL) 50 MG tablet Take 1 tablet (50 mg total) by mouth at bedtime. Patient not taking: Reported on 01/05/2021 12/23/19   McGowen, Adrian Blackwater, MD    Family History Family History  Problem Relation Age of Onset   Stroke Mother    Suicidality Father    Stroke Brother    Stroke Son     Social History Social History   Tobacco Use   Smoking status: Former    Packs/day: 1.00    Types: Cigarettes    Quit date: 03/08/1999    Years since quitting: 21.8   Smokeless tobacco: Never  Vaping Use   Vaping Use: Never used  Substance Use Topics   Alcohol use: Yes    Alcohol/week: 7.0 standard drinks    Types: 7 Shots of liquor per week    Comment:  daily   Drug use: No     Allergies   Other, Prednisone, and Penicillins   Review of Systems Review of Systems  Constitutional:  Negative for  fatigue and fever.  HENT:  Negative for mouth sores.   Eyes:  Negative for visual disturbance.  Respiratory:  Negative for shortness of breath.   Cardiovascular:  Negative for chest pain.  Gastrointestinal:  Negative for abdominal pain, nausea and vomiting.  Musculoskeletal:  Positive for back pain and myalgias. Negative for arthralgias and joint swelling.  Skin:  Negative for color change, rash and wound.  Neurological:  Negative for dizziness, weakness, light-headedness and headaches.    Physical Exam Triage Vital Signs ED Triage Vitals  Enc Vitals Group     BP 01/05/21 1540 (!) 170/82     Pulse Rate 01/05/21 1536 65     Resp 01/05/21 1536 14     Temp 01/05/21 1536 98.1 F (36.7 C)     Temp Source 01/05/21 1536 Oral     SpO2 01/05/21 1536 98 %     Weight 01/05/21 1538 164 lb (74.4 kg)     Height 01/05/21 1538 5' 8.5" (1.74 m)     Head Circumference --      Peak Flow --      Pain Score 01/05/21 1538 7     Pain Loc --      Pain Edu? --      Excl. in [place]? --    No data found.  Updated Vital Signs BP (!) 170/82 (BP Location: Left Arm)   Pulse 65   Temp 98.1 F (36.7 C) (Oral)   Resp 14   Ht 5' 8.5" (1.74 m)   Wt 164 lb (74.4 kg)   SpO2 98%   BMI 24.57 kg/m   Visual Acuity Right Eye Distance:   Left Eye Distance:   Bilateral Distance:    Right Eye Near:   Left Eye Near:    Bilateral Near:     Physical Exam Vitals and nursing note reviewed.  Constitutional:      Appearance: She is well-developed.     Comments: No acute distress  HENT:     Head: Normocephalic and  atraumatic.     Nose: Nose normal.  Eyes:     Conjunctiva/sclera: Conjunctivae normal.  Cardiovascular:     Rate and Rhythm: Normal rate.  Pulmonary:     Effort: Pulmonary effort is normal. No respiratory distress.     Comments: Breathing comfortably at rest, CTABL, no wheezing, rales or other adventitious sounds auscultated  Abdominal:     General: There is no distension.  Musculoskeletal:         General: Normal range of motion.     Cervical back: Neck supple.     Comments: Nontender to palpation of cervical, thoracic and lumbar spine midline, no palpable deformity or step-off, increased tenderness throughout lateral left lower thoracic musculature/area, no extension into the lumbar area, nontender to ribs on midaxillary line  Skin:    General: Skin is warm and dry.  Neurological:     Mental Status: She is alert and oriented to person, place, and time.     UC Treatments / Results  Labs (all labs ordered are listed, but only abnormal results are displayed) Labs Reviewed - No data to display  EKG   Radiology DG Ribs Unilateral W/Chest Left  Result Date: 01/05/2021 CLINICAL DATA:  [place] 2 days ago, left lower rib pain EXAM: LEFT RIBS AND CHEST - 3+ VIEW COMPARISON:  06/26/2020 FINDINGS: Frontal view of the chest as well as frontal and oblique views of the left thoracic cage are obtained. Cardiac silhouette is unremarkable. No airspace disease, effusion, or pneumothorax. No acute displaced fracture. Oblique calcified left breast prosthesis. IMPRESSION: 1. No acute intrathoracic process. Electronically Signed   By: Randa Ngo M.D.   On: 01/05/2021 17:19    Procedures Procedures (including critical care time)  Medications Ordered in UC Medications - No data to display  Initial Impression / Assessment and Plan / UC Course  I have reviewed the triage vital signs and the nursing notes.  Pertinent labs & imaging results that were available during my care of the patient were reviewed by me and considered in my medical decision making (see chart for details).     No midline tenderness, checking x-ray to evaluate for any rib fractures, lungs clear to auscultation, suspect likely contusion/thoracic strain and recommending treatment with anti-inflammatories, activity modification, ice and heat.  Recommendations provided.  Discussed strict return precautions. Patient verbalized  understanding and is agreeable with plan.  Final Clinical Impressions(s) / UC Diagnoses   Final diagnoses:  Acute left-sided thoracic back pain     Discharge Instructions      Please go for x-ray-I will call with results Tylenol/ibuprofen Diclofenac topically to area May try over-the-counter lidocaine patches Alternate ice and heat Follow-up if not improving or worsening     ED Prescriptions     Medication Sig Dispense Auth. Provider   ibuprofen (ADVIL) 600 MG tablet Take 1 tablet (600 mg total) by mouth every 6 (six) hours as needed. 30 tablet Octa Uplinger C, PA-C   diclofenac Sodium (VOLTAREN) 1 % GEL Apply 2 g topically 4 (four) times daily. 150 g Jackelin Correia, [place] C, PA-C      PDMP not reviewed this encounter.   Joneen Caraway [place] C, [place] 01/06/21 [phone]

## 2021-01-12 NOTE — Progress Notes (Signed)
I, [name], LAT, ATC, am serving as scribe for Dr. Lynne Leader.  Marissa Hall is a 80 y.o. female who presents to [place] at [facility] today for L flank/lower back pain after suffering a fall on 01/03/21 when she fell when standing up from the toilet, striking her L lower back on the edge of the bathtub/shower.  She was seen at the [facility] on 01/05/21 w/ c/o L-sided mid- and lower back pain and was d/c w/ instructions to use Tylenol/IBU and topical Voltaren gel.  Today, pt reports that she is slightly improved but con't to have significant pain when laying down.    Aggravating factors: transitioning from supine-to-sit and sitting-to-standing; coughing; bed mobility; laying down at night  Treatments tried: as above per Urgent Care; ice; heat  Diagnostic imaging: Rib/chest XR- 01/05/21  Pertinent review of systems: No fevers or chills  Relevant historical information: History of breast cancer about 5 years ago.  DVT history.  Diabetes.  Hypertension.   Exam:  BP 130/80 (BP Location: Left Arm, Patient Position: Sitting, Cuff Size: Normal)   Pulse 69   Ht 5' 8.5" (1.74 m)   Wt 163 lb 3.2 oz (74 kg)   SpO2 98%   BMI 24.45 kg/m  General: Well Developed, well nourished, and in no acute distress.   MSK: T-spine nontender midline.  Tender palpation left inferior lateral ribs and posterior chest wall. Decreased thoracic motion. L-spine nontender midline.  Tender palpation left lumbar paraspinal musculature and tender to palpation superior to left iliac crest. Decreased lumbar motion. Hip and pelvis mildly tender palpation along the sacrum.  Minimally tender palpation left iliac crest. Antalgic gait.    Lab and Radiology Results  X-ray images left ribs and chest, left hip, and L-spine obtained today personally and independently interpreted  Ribs and chest: No obvious displaced rib fracture.  No pneumothorax or pleural effusion.  However ribs are  difficult to visualize at area of pain.   L-spine: Mild anterior listhesis L2 on L3.  No acute fractures.  Diffuse degenerative changes.  Aortic atherosclerosis present.  Left hip: No clear fracture of hip or pelvis.  Await formal radiology review  EXAM: LEFT RIBS AND CHEST - 3+ VIEW   COMPARISON:  06/26/2020   FINDINGS: Frontal view of the chest as well as frontal and oblique views of the left thoracic cage are obtained. Cardiac silhouette is unremarkable. No airspace disease, effusion, or pneumothorax. No acute displaced fracture. Oblique calcified left breast prosthesis.   IMPRESSION: 1. No acute intrathoracic process.     Electronically Signed   By: Randa Ngo M.D.   On: 01/05/2021 17:19 I, Lynne Leader, personally (independently) visualized and performed the interpretation of the images attached in this note.    Assessment and Plan: 80 y.o. female with left flank pain after a fall occurring about a week and a half ago.  Pain thought predominantly due to muscle contusion and spasm.  An occult fracture is a possibility especially the inferior posterior ribs which are difficult to visualize on today's x-ray and on the x-ray dated August 2.  I would appreciate radiology overread which is pending.  For now plan for conservative management with a rib binder, physical therapy referral, and advancing activity as tolerated.  Limited tizanidine especially at bedtime.  Continue Tylenol and ibuprofen and recheck in a month.   PDMP not reviewed this encounter. Orders Placed This Encounter  Procedures   DG Ribs Unilateral W/Chest Left  Standing Status:   Future    Number of Occurrences:   1    Standing Expiration Date:   01/13/2022    Order Specific Question:   Reason for Exam (SYMPTOM  OR DIAGNOSIS REQUIRED)    Answer:   eval rib pain    Order Specific Question:   Preferred imaging location?    Answer:   [facility]   DG HIP UNILAT WITH PELVIS 2-3 VIEWS LEFT     Standing Status:   Future    Number of Occurrences:   1    Standing Expiration Date:   01/13/2022    Order Specific Question:   Reason for Exam (SYMPTOM  OR DIAGNOSIS REQUIRED)    Answer:   eval left hip pain    Order Specific Question:   Preferred imaging location?    Answer:   [facility]   DG Lumbar Spine 2-3 Views    Standing Status:   Future    Number of Occurrences:   1    Standing Expiration Date:   01/13/2022    Order Specific Question:   Reason for Exam (SYMPTOM  OR DIAGNOSIS REQUIRED)    Answer:   eval low back pain left    Order Specific Question:   Preferred imaging location?    Answer:   [facility]   Ambulatory referral to Physical Therapy    Referral Priority:   Routine    Referral Type:   Physical Medicine    Referral Reason:   Specialty Services Required    Requested Specialty:   Physical Therapy    Number of Visits Requested:   1   Meds ordered this encounter  Medications   tiZANidine (ZANAFLEX) 2 MG tablet    Sig: Take 1-2 tablets (2-4 mg total) by mouth every 8 (eight) hours as needed for muscle spasms. Take mostly at bedtime    Dispense:  60 tablet    Refill:  0     Discussed warning signs or symptoms. Please see discharge instructions. Patient expresses understanding.   The above documentation has been reviewed and is accurate and complete Lynne Leader, M.D.

## 2021-01-13 ENCOUNTER — Other Ambulatory Visit: Payer: Self-pay

## 2021-01-13 ENCOUNTER — Ambulatory Visit (INDEPENDENT_AMBULATORY_CARE_PROVIDER_SITE_OTHER): Payer: Medicare Other | Admitting: Family Medicine

## 2021-01-13 ENCOUNTER — Ambulatory Visit (INDEPENDENT_AMBULATORY_CARE_PROVIDER_SITE_OTHER): Payer: Medicare Other

## 2021-01-13 VITALS — BP 130/80 | HR 69 | Ht 68.5 in | Wt 163.2 lb

## 2021-01-13 DIAGNOSIS — M545 Low back pain, unspecified: Secondary | ICD-10-CM

## 2021-01-13 DIAGNOSIS — M25552 Pain in left hip: Secondary | ICD-10-CM | POA: Diagnosis not present

## 2021-01-13 DIAGNOSIS — R0781 Pleurodynia: Secondary | ICD-10-CM | POA: Diagnosis not present

## 2021-01-13 DIAGNOSIS — S20212A Contusion of left front wall of thorax, initial encounter: Secondary | ICD-10-CM | POA: Diagnosis not present

## 2021-01-13 MED ORDER — TIZANIDINE HCL 2 MG PO TABS: 2.0000 mg | ORAL_TABLET | Freq: Three times a day (TID) | ORAL | 0 refills | Status: DC | PRN

## 2021-01-13 NOTE — Patient Instructions (Signed)
Thank you for coming in today.   Please get an Xray today before you leave   Heating pad can help.   Try the tizandine at bedtime as needed.   OK to take with ibuprofen.  OK to take that with tylenol.   Recheck in about 1 month.   I've referred you to Physical Therapy.  Let us know if you don't hear from them in one week.   I can order a CT scan if needed.   Let me know.   Bed Ladder Assist - Pull Up Assist Device with Handle

## 2021-01-14 NOTE — Progress Notes (Signed)
Radiology thinks that if possible that you do have 2 broken ribs on the left side.  Rib binder will help.  Heating pad will help.  The tizanidine muscle relaxer medicine at bedtime will help.  Make sure to take deep breaths about every 30 minutes while awake.  Other x-rays are still pending.

## 2021-01-15 NOTE — Progress Notes (Signed)
Left hip x-ray shows a tiny bone spur in the hip joint but otherwise looks normal to radiology.  No significant arthritis changes otherwise.  No fractures.

## 2021-01-15 NOTE — Progress Notes (Signed)
Lumbar spine x-ray shows multilevel arthritis changes.  No fractures are visible.

## 2021-01-19 ENCOUNTER — Other Ambulatory Visit: Payer: Self-pay | Admitting: Family Medicine

## 2021-01-22 ENCOUNTER — Other Ambulatory Visit: Payer: Self-pay

## 2021-01-22 ENCOUNTER — Encounter: Payer: Self-pay | Admitting: Family Medicine

## 2021-01-22 ENCOUNTER — Ambulatory Visit (INDEPENDENT_AMBULATORY_CARE_PROVIDER_SITE_OTHER): Payer: Medicare Other | Admitting: Family Medicine

## 2021-01-22 VITALS — BP 190/77 | HR 64 | Temp 98.3°F | Resp 16 | Ht 68.5 in | Wt 168.0 lb

## 2021-01-22 DIAGNOSIS — I10 Essential (primary) hypertension: Secondary | ICD-10-CM

## 2021-01-22 DIAGNOSIS — E878 Other disorders of electrolyte and fluid balance, not elsewhere classified: Secondary | ICD-10-CM | POA: Diagnosis not present

## 2021-01-22 LAB — BASIC METABOLIC PANEL
BUN: 16 mg/dL (ref 6–23)
CO2: 27 mEq/L (ref 19–32)
Calcium: 9.8 mg/dL (ref 8.4–10.5)
Chloride: 101 mEq/L (ref 96–112)
Creatinine, Ser: 0.8 mg/dL (ref 0.40–1.20)
GFR: 70.91 mL/min (ref >60.00)
Glucose, Bld: 111 mg/dL — ABNORMAL HIGH (ref 70–99)
Potassium: 4.9 mEq/L (ref 3.5–5.1)
Sodium: 138 mEq/L (ref 135–145)

## 2021-01-22 MED ORDER — HYDROCHLOROTHIAZIDE 25 MG PO TABS: ORAL_TABLET | ORAL | 1 refills | Status: DC

## 2021-01-22 NOTE — Progress Notes (Signed)
OFFICE VISIT  01/22/2021  CC:  Chief Complaint  Patient presents with   Follow-up    Blood pressure   HPI:    Patient is a 80 y.o. Caucasian female who presents for intermittently uncontrolled HTN. She has DM2 and hypothyroidism-->managed by endocrinologist.  Last visit with me 07/02/20->increased lisinopril to '40mg'$  qd.  She had rib contusion, saw sports med, alternating tylenol and advil, taking muscle relaxer some. Chronic mild dizziness feeling, stood up too quick and felt increase in her dizziness and couldn't catch herself. No prob with falling prior to that or since. Has had her feeling of mild dizziness/disequilibrium for " a while"--chronic.  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  Has never had audiol or ENT MD.  Past Medical History:  Diagnosis Date   Acute DVT (deep venous thrombosis) ([place]) 04/23/2019   Right popliteal   Anxiety and depression 1964   oncologist started duloxetine 12/2016   Breast cancer ([place]) 03/14/2016   Clinical stage 2A: (triple neg): Right breast, upper inner quadrant, 03/2016.  Neoadjuvant chemo x 5 cycles,lumpectomy 4 mo later, then RT started 10/2016.  Adjuvant Xeloda [id number].  SWOG research trial pt 04/2017--pt randomized to pembrolizumab immunotherapy.  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  A1c Mar 12, 2018 was 7.0% at Dr. Shirlyn Goltz.    Epidermoid cyst of vulva    Chronic epidermoid cyst of the vulva.  Excision done 02/2019   GERD (gastroesophageal reflux disease) 2013   Hyperlipidemia 1986   Hypertension 2008   Hypothyroidism 1988    Diagnosed in her 69s.  Managed by Endocrinologist   Lumbar radiculopathy 04/2019   Dr. Tamala Julian to get plain films of LB and hip (considering MRI due to her hx of cancer)   Osteoporosis 2015   pt states "osteopenia", but then says that she refused to take the rx med for this condition, so I suspect she had osteoporosis.   Peripheral neuropathy 2017   Patient states diabetic neuropathy in feet prior to starting chemotherapy and then worsened by chemo.    TIA (transient ischemic attack) 03/26/2011   2012: question of (HA + R eye "floaters"). CT in ED neg acute. Not admitted, no f/u testing done.  ?ocular migraine?    Past Surgical History:  Procedure Laterality Date   ABDOMINAL HYSTERECTOMY  1972   APPENDECTOMY  1972   BREAST ENHANCEMENT SURGERY  1982   BREAST IMPLANT REMOVAL Right 09/13/2016   Procedure: REMOVAL RIGHT BREAST IMPLANT;  Surgeon: Irene Limbo, MD;  Location: [place];  Service: Plastics;  Laterality: Right;   BREAST LUMPECTOMY WITH RADIOACTIVE SEED AND SENTINEL LYMPH NODE BIOPSY Right 09/13/2016   Procedure: RIGHT BREAST LUMPECTOMY WITH RADIOACTIVE SEED X 2 AND SENTINEL LYMPH NODE BIOPSY;  Surgeon: Alphonsa Overall, MD;  Location: [place];  Service: General;  Laterality: Right;   BREAST SURGERY Right 03/14/2016   Biopsy   CAPSULECTOMY Right 09/13/2016   Procedure: RIGHT CAPSULECTOMY;  Surgeon: Irene Limbo, MD;  Location: [facility];  Service: Plastics;  Laterality: Right;   CATARACT EXTRACTION, BILATERAL Bilateral 08/10/17 right eye, 08/31/17  left eye   MASS EXCISION Left 02/28/2018   Path: benign.  Procedure: EXCISIONLEFT MEDIAL THIGH MASS ERAS PATHWAY;  Surgeon: Erroll Luna, MD;  Location: [facility];  Service: General;  Laterality: Left;   PORTACATH PLACEMENT N/A 03/15/2016   Procedure: INSERTION PORT-A-CATH WITH [place];  Surgeon: Alphonsa Overall, MD;  Location: [facility];  Service: General;  Laterality: N/A;    PORTACATH REMOVAL  07/2017   surgical repair left ankle Left 2009   s/p [place]   Age 53    Outpatient Medications Prior to Visit  Medication Sig Dispense Refill   aspirin 81 MG EC tablet Take 1 tablet (81 mg total) by mouth daily. Swallow whole. 30 tablet 12   atorvastatin (LIPITOR) 80 MG tablet Take 80 mg by mouth every evening.      cholecalciferol (VITAMIN D3) 25 MCG (1000 UNIT) tablet Take 1,000 Units by mouth daily.     diclofenac Sodium (VOLTAREN) 1 % GEL Apply 2 g topically 4 (four) times daily. 150 g 0   DULoxetine (CYMBALTA) 30 MG capsule TAKE ONE CAPSULE BY MOUTH DAILY 30 capsule 5   estradiol (ESTRACE VAGINAL) 0.1 MG/GM vaginal cream Insert 1 gram vaginally twice weekly 42.5 g 0   ibuprofen (ADVIL) 600 MG tablet Take 1 tablet (600 mg total) by mouth every 6 (six) hours as needed. 30 tablet 0   levothyroxine (SYNTHROID) 112 MCG tablet 112 mcg daily before breakfast.      lisinopril (ZESTRIL) 40 MG tablet Take 1 tablet (40 mg total) by mouth daily. 14 tablet 0   metFORMIN (GLUCOPHAGE-XR) 500 MG 24 hr tablet Take 2 tablets (1,000 mg total) by mouth in the morning and at bedtime.     Propylene Glycol (SYSTANE BALANCE OP) Place 1 drop into both eyes daily as needed (for dry eyes).     tiZANidine (ZANAFLEX) 2 MG tablet Take 1-2 tablets (2-4 mg total) by mouth every 8 (eight) hours as needed for muscle spasms. Take mostly at bedtime 60 tablet 0   traZODone (DESYREL) 50 MG tablet Take 1 tablet (50 mg total) by mouth at bedtime. 90 tablet 3   No facility-administered medications prior to visit.    Allergies  Allergen Reactions   Other Other (See Comments)    STEROIDS- emotional   Prednisone Other (See Comments)    Other reaction(s): Mental Status Changes (intolerance)   Penicillins Other (See Comments)    Unsure of reaction, was 80 years old    ROS As per HPI  PE: Vitals with BMI 01/22/2021 01/13/2021 01/05/2021  Height 5' 8.5" 5' 8.5" 5'  8.5"  Weight 168 lbs 163 lbs 3 oz 164 lbs  BMI 25.17 [id number]  Systolic [id number]  Diastolic 77 80 82  Pulse 64 69 65  Manual bp recheck at end of visit today 160/80  Gen: Alert, well appearing.  Patient is oriented to person, place, time, and situation. AFFECT: pleasant, lucid thought and speech. CV: RRR, no m/r/g.   LUNGS: CTA bilat, nonlabored resps, good aeration in all lung fields. EXT: no clubbing or cyanosis.  no edema.  Neuro: CN 2-12 intact bilaterally, strength 5/5 in proximal and distal upper extremities and lower extremities bilaterally.  No sensory deficits.  No tremor.  No disdiadochokinesis.  No ataxia.  Upper extremity and lower extremity DTRs symmetric.  No pronator drift.   LABS:  Lab Results  Component Value Date   TSH 2.33 03/23/2020  Lab Results  Component Value Date   WBC 4.8 11/03/2020   HGB 14.1 11/03/2020   HCT 42.6 11/03/2020   MCV 99.1 11/03/2020   PLT 171 11/03/2020   Lab Results  Component Value Date   CREATININE 0.83 11/03/2020   BUN 17 11/03/2020   NA 139 11/03/2020   K 4.7 11/03/2020   CL 101 11/03/2020   CO2 27 11/03/2020   Lab Results  Component Value Date   ALT 34 11/03/2020   AST 23 11/03/2020   ALKPHOS 65 11/03/2020   BILITOT 1.4 (H) 11/03/2020   Lab Results  Component Value Date   CHOL 195 03/23/2020   No results found for: HDL Lab Results  Component Value Date   LDLCALC 92 03/23/2020   Lab Results  Component Value Date   HGBA1C 7.4 03/23/2020   IMPRESSION AND PLAN:  1) Uncontrolled HTN. Add hctz 25 qd. Cont lisinopril 40 qd. BMET today. Get home bp cuff and check bp and hr daily. BMET upon f/u in 2-3 wks.  2) Disequilibrium syndrome. Unknown etiology. Check for sign of impaired posterior circ/vertebrals on carotid duplex ultrasound--ordered today.  An After Visit Summary was printed and given to the patient.  FOLLOW UP: Return for 2-3 wk f/u HTN.  Signed:  Crissie Sickles, MD           01/22/2021

## 2021-01-25 ENCOUNTER — Other Ambulatory Visit: Payer: Self-pay

## 2021-01-25 MED ORDER — LISINOPRIL 40 MG PO TABS: 40.0000 mg | ORAL_TABLET | Freq: Every day | ORAL | 0 refills | Status: DC

## 2021-01-26 ENCOUNTER — Ambulatory Visit (HOSPITAL_BASED_OUTPATIENT_CLINIC_OR_DEPARTMENT_OTHER)
Admission: RE | Admit: 2021-01-26 | Discharge: 2021-01-26 | Disposition: A | Discharge status: Home / Self Care | Payer: Medicare Other | Source: Ambulatory Visit | Attending: Family Medicine | Admitting: Family Medicine

## 2021-01-26 ENCOUNTER — Other Ambulatory Visit: Payer: Self-pay

## 2021-01-26 DIAGNOSIS — H539 Unspecified visual disturbance: Secondary | ICD-10-CM | POA: Diagnosis not present

## 2021-01-26 DIAGNOSIS — E878 Other disorders of electrolyte and fluid balance, not elsewhere classified: Secondary | ICD-10-CM | POA: Diagnosis not present

## 2021-01-26 DIAGNOSIS — E119 Type 2 diabetes mellitus without complications: Secondary | ICD-10-CM | POA: Diagnosis not present

## 2021-01-26 DIAGNOSIS — Z8673 Personal history of transient ischemic attack (TIA), and cerebral infarction without residual deficits: Secondary | ICD-10-CM | POA: Diagnosis not present

## 2021-01-26 DIAGNOSIS — I10 Essential (primary) hypertension: Secondary | ICD-10-CM | POA: Diagnosis not present

## 2021-01-27 ENCOUNTER — Encounter: Payer: Self-pay | Admitting: Family Medicine

## 2021-01-27 ENCOUNTER — Telehealth: Payer: Self-pay | Admitting: Family Medicine

## 2021-01-27 NOTE — Telephone Encounter (Signed)
Patient returned call for [place] results. Please call patient to advise.

## 2021-01-27 NOTE — Telephone Encounter (Signed)
Spoke with patient regarding results/recommendations.  

## 2021-01-30 ENCOUNTER — Telehealth: Payer: Self-pay

## 2021-01-30 NOTE — Telephone Encounter (Signed)
LVM for pt to CB to sched AWV.

## 2021-02-02 ENCOUNTER — Encounter: Payer: Self-pay | Admitting: Physical Therapy

## 2021-02-02 ENCOUNTER — Ambulatory Visit: Payer: Medicare Other | Attending: Family Medicine | Admitting: Physical Therapy

## 2021-02-02 ENCOUNTER — Other Ambulatory Visit: Payer: Self-pay

## 2021-02-02 DIAGNOSIS — Z9181 History of falling: Secondary | ICD-10-CM | POA: Insufficient documentation

## 2021-02-02 DIAGNOSIS — M545 Low back pain, unspecified: Secondary | ICD-10-CM | POA: Insufficient documentation

## 2021-02-02 DIAGNOSIS — R2681 Unsteadiness on feet: Secondary | ICD-10-CM | POA: Diagnosis not present

## 2021-02-02 DIAGNOSIS — M6281 Muscle weakness (generalized): Secondary | ICD-10-CM | POA: Diagnosis not present

## 2021-02-02 DIAGNOSIS — R2689 Other abnormalities of gait and mobility: Secondary | ICD-10-CM | POA: Insufficient documentation

## 2021-02-02 DIAGNOSIS — R29898 Other symptoms and signs involving the musculoskeletal system: Secondary | ICD-10-CM | POA: Insufficient documentation

## 2021-02-02 DIAGNOSIS — R293 Abnormal posture: Secondary | ICD-10-CM | POA: Diagnosis not present

## 2021-02-02 NOTE — Therapy (Signed)
[facility] [address]  [place], [place], [address] Phone: [phone]   Fax:  [phone]  Physical Therapy Evaluation  Patient Details  Name: Marissa Hall MRN: [id number] Date of Birth: 1940-08-08 Referring Provider (PT): Gregor Hams, MD   Encounter Date: 02/02/2021   PT End of Session - 02/02/21 1402     Visit Number 1    Number of Visits 16    Date for PT Re-Evaluation 03/30/21    Authorization Type Medicare & BCBS    PT Start Time [id number]    PT Stop Time [id number]    PT Time Calculation (min) 46 min    Activity Tolerance Patient tolerated treatment well    Behavior During Therapy [facility] for tasks assessed/performed             Past Medical History:  Diagnosis Date   Acute DVT (deep venous thrombosis) ([place]) 04/23/2019   Right popliteal   Anxiety and depression 1964   oncologist started duloxetine 12/2016   Breast cancer ([place]) 03/14/2016   Clinical stage 2A: (triple neg): Right breast, upper inner quadrant, 03/2016.  Neoadjuvant chemo x 5 cycles,lumpectomy 4 mo later, then RT started 10/2016.  Adjuvant Xeloda [id number].  SWOG research trial pt 04/2017--pt randomized to pembrolizumab immunotherapy.  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  A1c Mar 12, 2018 was 7.0% at Dr. Shirlyn Goltz.    Epidermoid cyst of vulva    Chronic epidermoid cyst of the vulva.  Excision done 02/2019   GERD (gastroesophageal reflux disease) 2013   Hyperlipidemia 1986   Hypertension 2008   Hypothyroidism 1988   Diagnosed in her 76s.  Managed by Endocrinologist   Lumbar  radiculopathy 04/2019   Dr. Tamala Julian to get plain films of LB and hip (considering MRI due to her hx of cancer)   Osteoporosis 2015   pt states "osteopenia", but then says that she refused to take the rx med for this condition, so I suspect she had osteoporosis.   Peripheral neuropathy 2017   Patient states diabetic neuropathy in feet prior to starting chemotherapy and then worsened by chemo.    TIA (transient ischemic attack) 03/26/2011   2012: question of (HA + R eye "floaters"). CT in ED neg acute. Not admitted, no f/u testing done.  ?ocular migraine?    Past Surgical History:  Procedure Laterality Date   ABDOMINAL HYSTERECTOMY  1972   APPENDECTOMY  1972   BREAST ENHANCEMENT SURGERY  1982   BREAST IMPLANT REMOVAL Right 09/13/2016   Procedure: REMOVAL RIGHT BREAST IMPLANT;  Surgeon: Irene Limbo, MD;  Location: [place];  Service: Plastics;  Laterality: Right;   BREAST LUMPECTOMY WITH RADIOACTIVE SEED AND SENTINEL LYMPH NODE BIOPSY Right 09/13/2016   Procedure: RIGHT BREAST LUMPECTOMY WITH RADIOACTIVE SEED X 2 AND SENTINEL LYMPH NODE BIOPSY;  Surgeon: Alphonsa Overall, MD;  Location: [place];  Service: General;  Laterality: Right;   BREAST SURGERY Right 03/14/2016   Biopsy   CAPSULECTOMY Right 09/13/2016   Procedure: RIGHT CAPSULECTOMY;  Surgeon: Irene Limbo, MD;  Location: [place];  Service: Clinical cytogeneticist;  Laterality: Right;   CATARACT EXTRACTION, BILATERAL Bilateral 08/10/17 right eye, 08/31/17 left eye   MASS EXCISION Left 02/28/2018   Path: benign.  Procedure: EXCISIONLEFT MEDIAL THIGH MASS ERAS PATHWAY;  Surgeon: Erroll Luna, MD;  Location: [place];  Service: General;  Laterality: Left;   PORTACATH PLACEMENT N/A 03/15/2016   Procedure: INSERTION PORT-A-CATH WITH [place];  Surgeon: Alphonsa Overall, MD;  Location: [facility];  Service: General;  Laterality: N/A;   PORTACATH REMOVAL  07/2017   surgical repair left ankle Left  2009   s/p [place]   Age 68   US CAROTID DOPPLER BILATERAL ([place] [facility])  01/2021   <50% bilat int carotid sten, otherwise normal.    There were no vitals filed for this visit.    Subjective Assessment - 02/02/21 1406     Subjective Pt reports she got up in the night and fell - was not able to catch herself. Fell in bathroom/laundry room hitting her L side against the side of the shower stall. She has noticed increased clumsiness and weakness for ~6 months.    Diagnostic tests 01/13/21 - Lumbar spine & L hip x-rays: negative; Rib x-rays: 1. Probable minimally displaced fractures involving the left sixth and seventh ribs.  2. Negative for a pneumothorax.    Patient Stated Goals "to feel more flexible and safer"    Currently in Pain? Yes    Pain Score 5    4-5/10   Pain Location Flank    Pain Orientation Left    Pain Descriptors / Indicators Sore;Discomfort    Pain Type Acute pain    Pain Onset 1 to 4 weeks ago   01/03/21   Pain Frequency Constant    Aggravating Factors  deep breath; transitonal movements, esp in/out bed or sit <> stand    Pain Relieving Factors ibuprofen, heating pad inthe morning; LSO    Effect of Pain on Daily Activities limits movement predominantly with transitional movements                [facility] PT Assessment - 02/02/21 1402       Assessment   Medical Diagnosis Acute L LBP, L sided rib/flank pain and contusion, L hip pain s/o fall    Referring Provider (PT) Gregor Hams, MD    Onset Date/Surgical Date 01/03/21    Hand Dominance Right    Next MD Visit 02/10/21    Prior Therapy PT for piriformis syndrome      Precautions   Precautions None      Restrictions   Weight Bearing Restrictions No      Balance Screen   Has the patient fallen in the past 6 months Yes    How many times? 1 - at time of injury    Has the patient had a decrease in activity level because of a fear of falling?  Yes    Is the patient reluctant to  leave their home because of a fear of falling?  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[place] taking care of her 3 dogs & 2 cats      Cognition   Overall Cognitive Status Within Functional Limits for tasks assessed      Observation/Other Assessments   Focus on Therapeutic Outcomes (FOTO)  Lumbar = 63; predicted D/C FS = 72      Posture/Postural Control   Posture/Postural Control Postural limitations    Postural Limitations Left pelvic obliquity;Weight shift right      ROM / Strength   AROM / PROM / Strength AROM;Strength      AROM   AROM Assessment Site Lumbar    Lumbar Flexion hands to ankles    Lumbar Extension 25% limited    Lumbar - Right Side Bend hand to lateral knee - L side pain    Lumbar - Left Side Bend hand to lateral knee    Lumbar - Right Rotation 25% limited      Strength   Overall Strength Comments tested is sitting d/t pain with transitional movements    Strength Assessment Site Hip;Knee;Ankle    Right/Left Hip Right;Left    Right Hip Flexion 4/5    Right Hip Extension 3+/5    Right Hip External Rotation  4-/5    Right Hip Internal Rotation 4/5    Right Hip ABduction 4/5    Right Hip ADduction 4/5    Left Hip Flexion 4-/5    Left Hip Extension 3+/5    Left Hip External Rotation 3+/5    Left Hip Internal Rotation 4-/5    Left Hip ABduction 4/5    Left Hip ADduction 4/5    Right/Left Knee Right;Left    Right  Knee Flexion 4/5    Right Knee Extension 4/5    Left Knee Flexion 4-/5    Left Knee Extension 4/5    Right/Left Ankle Right;Left    Right Ankle Dorsiflexion 4/5    Left Ankle Dorsiflexion 4/5                        Objective measurements completed on examination: See above findings.       [place] Adult PT Treatment/Exercise - 02/02/21 1402       Exercises   Exercises Lumbar      Lumbar Exercises: Stretches   Passive Hamstring Stretch Left;Right;1 rep;30 seconds    Passive Hamstring Stretch Limitations seated hip hinge    Standing Side Bend Left;1 rep;30 seconds    Standing Side Bend Limitations ITB/QL stretch leaning over counter    Piriformis Stretch Right;Left;1 rep;30 seconds    Piriformis Stretch Limitations seated KTOS    Figure 4 Stretch 1 rep;30 seconds;Seated;With overpressure                    PT Education - 02/02/21 1448     Education Details PT eval findings, anticipated POC & initial HEP - Access Code: [email or web address]    Person(s) Educated Patient    Methods Explanation;Demonstration;Verbal cues;Handout    Comprehension Verbalized understanding;Verbal cues required;Returned demonstration;Need further instruction              PT Short Term Goals - 02/02/21 1952       PT SHORT TERM GOAL #1  Title Patient will be independent with initial HEP    Status New    Target Date 03/02/21      PT SHORT TERM GOAL #2   Title Patient will verbalize/demonstrate good awareness of neutral spine posture and proper body mechanics for daily tasks    Status New    Target Date 03/02/21      PT SHORT TERM GOAL #3   Title Decrease L flank pain by >/= 25-50% allowing patient increased ease of bed mobility and transitional movements    Status New    Target Date 03/02/21      PT SHORT TERM GOAL #4   Title Complete balance screen and set goal for improvement    Status New    Target Date 03/02/21               PT Long Term Goals - 02/02/21 1448        PT LONG TERM GOAL #1   Title Patient will be independent with ongoing/advanced HEP for self-management at home in order to build upon functional gains in therapy    Status New    Target Date 03/30/21      PT LONG TERM GOAL #2   Title Patient to demonstrate ability to achieve and maintain good spinal alignment/posturing    Status New    Target Date 03/30/21      PT LONG TERM GOAL #3   Title Decrease L flank pain by >/= 50-75% allowing patient increased ease of bed mobility and transitional movements    Status New    Target Date 03/30/21      PT LONG TERM GOAL #4   Title Patient to improve lumbopelvic flexibilty and AROM to [facility] without pain provocation    Status New    Target Date 03/30/21      PT LONG TERM GOAL #5   Title Patient to report ability to perform ADLs, household, and work-related tasks without limitation due to L flank pain, LOM or weakness    Status New    Target Date 03/30/21                    Plan - 02/02/21 1448     Clinical Impression Statement Petria is a 80 y/o female who presents to OP PT for L sided LBP, rib, flank and hip pain s/p fall in bathroom/laundry room on 01/03/21. X-rays positive for probable minimally displaced L 6th and 7th rib fractures. Pt unsure what precipitated the fall as it occurred when she got up in the middle of the night but has noted increased clumsiness and weakness for ~6 months. Current deficits include L sided flank and rib pain, postural dysfunction including L pelvic obliquity, decreased lumbopelvic and proximal LE flexibility, decreased lumbar ROM and B proximal > distal LE weakness. Marissa Hall will benefit from skilled PT to address above deficits and improve posture, functional ROM and strength with decreased pain to increase tolerance for ADLs and daily activities. She may also benefit from further balance assessment and treatment as indicated to decrease her risk for future falls.    Personal Factors and  Comorbidities Age;Fitness;Past/Current Experience;Time since onset of injury/illness/exacerbation;Comorbidity 3+    Comorbidities Osteoporosis, ORIF L ankle fracture 2009, HTN, DM-II, hypothyroidism, DVT, breast cancer, piriformis syndrome, TIA, GERD, anxiety and depression    Examination-Activity Limitations Bed Mobility;Dressing;Transfers;Toileting;Locomotion Level;Stairs;Stand;Sleep    Examination-Participation Restrictions Cleaning;Community Activity;Laundry;Meal Prep;Shop;Volunteer    Stability/Clinical Decision Making Evolving/Moderate complexity    Clinical Decision Making  Moderate    Rehab Potential Good    PT Frequency 2x / week    PT Duration 8 weeks    PT Treatment/Interventions ADLs/Self Care Home Management;Cryotherapy;Electrical Stimulation;Iontophoresis '4mg'$ /ml Dexamethasone;Moist Heat;Ultrasound;DME Instruction;Gait training;Stair training;Functional mobility training;Therapeutic activities;Therapeutic exercise;Balance training;Neuromuscular re-education;Patient/family education;Manual techniques;Passive range of motion;Dry needling;Taping;Spinal Manipulations;Joint Manipulations    PT Next Visit Plan Review initial HEP; initate lumbopelvic mobility and strengthening; manual therapy and modalities PRN    PT Home Exercise Plan Access Code: [email or web address] (8/30)    Consulted and Agree with Plan of Care Patient             Patient will benefit from skilled therapeutic intervention in order to improve the following deficits and impairments:  Decreased activity tolerance, Decreased balance, Decreased knowledge of precautions, Decreased mobility, Decreased range of motion, Decreased safety awareness, Decreased strength, Difficulty walking, Increased fascial restricitons, Increased muscle spasms, Impaired perceived functional ability, Impaired flexibility, Improper body mechanics, Postural dysfunction, Pain  Visit Diagnosis: Acute left-sided low back pain without sciatica  Other symptoms  and signs involving the musculoskeletal system  Other abnormalities of gait and mobility  Muscle weakness (generalized)  Abnormal posture  Unsteadiness on feet  History of falling     Problem List 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   Diabetes mellitus type 2 without retinopathy ([place]) 09/21/2020   Meibomian gland dysfunction (MGD) of both eyes 09/21/2020   History of COVID-19 06/23/2020   Piriformis syndrome of right side 05/16/2019   Lower extremity pain, right 04/23/2019   DVT (deep venous thrombosis) ([place]) 04/23/2019   Lumbar radiculopathy, right 04/02/2019   Other long term (current) drug therapy 06/14/2017   Anxiety and depression    History of breast cancer 03/02/2017   History of therapeutic radiation 03/02/2017   Breast pain, right 10/03/2016   Type 2 diabetes mellitus ([place]) 08/23/2016   Hypertension associated with diabetes ([place]) 08/23/2016   Hypothyroidism 08/23/2016   Port catheter in place 05/09/2016   Peripheral neuropathy 05/09/2016   Breast cancer of upper-inner quadrant of right female breast ([place]) 03/09/2016   Major depressive disorder, recurrent ([place]) [id number]   Uncomplicated alcohol dependence ([place]) 02/19/2014    Percival Spanish, PT, MPT 02/02/2021, 8:03 PM  [facility] [address]  [place], [place], [address] Phone: [phone]   Fax:  [phone]  Name: Allianna Wiggin MRN: [id number] Date of Birth: Aug 06, 1940

## 2021-02-02 NOTE — Patient Instructions (Signed)
     Access Code: [email or web address] URL: [email or web address] Date: 02/02/2021 Prepared by: Annie Paras  Exercises Seated Hamstring Stretch - 2-3 x daily - 7 x weekly - 3 reps - 30 sec hold Seated Figure 4 Piriformis Stretch - 2 x daily - 7 x weekly - 3 reps - 30 sec hold Seated Piriformis Stretch - 2-3 x daily - 7 x weekly - 3 reps - 30 sec hold Standing ITB Stretch - 2-3 x daily - 7 x weekly - 3 reps - 30 sec hold

## 2021-02-09 NOTE — Progress Notes (Deleted)
   I, [name], LAT, ATC, am serving as scribe for Dr. Lynne Leader.  Marissa Hall is a 80 y.o. female who presents to [place] at [facility] today for f/u of L flank/lower back pain after suffering a fall on 01/03/21 when she fell when standing up from the toilet, striking her L lower back on the edge of the bathtub/shower.  She was last seen by Dr. Georgina Snell on 01/13/21 and was prescribed Tizanidine.  She was also referred to PT of which she has completed one session.  Today, pt reports   Diagnostic imaging: L-spine, L hip and rib XR- 01/13/21; Rib/Lchest XR- 01/05/21  Pertinent review of systems: ***  Relevant historical information: ***   Exam:  There were no vitals taken for this visit. General: Well Developed, well nourished, and in no acute distress.   MSK: ***    Lab and Radiology Results No results found for this or any previous visit (from the past 72 hour(s)). No results found.     Assessment and Plan: 80 y.o. female with ***   PDMP not reviewed this encounter. No orders of the defined types were placed in this encounter.  No orders of the defined types were placed in this encounter.    Discussed warning signs or symptoms. Please see discharge instructions. Patient expresses understanding.   ***

## 2021-02-10 ENCOUNTER — Ambulatory Visit: Payer: Medicare Other | Admitting: Family Medicine

## 2021-02-10 NOTE — Progress Notes (Signed)
I, [name], LAT, ATC, am serving as scribe for Dr. Lynne Leader.  Marissa Hall is a 80 y.o. female who presents to [place] at [facility] today for f/u L rib fx and L-sided LBP after suffering a fall on 01/03/21 when she fell when standing up from the toilet, striking her L lower back on the edge of the bathtub/shower.  She was seen at the [facility] on 01/05/21. Pt was last seen by Dr. Georgina Snell on 01/13/21 and was advised to use a rib binder and advance activity as tolerated. Pt was also referred to PT, of which she's completed 1 visit. Today, pt reports that her ribs are feeling better but con't to have some lingering discomfort when moving or breathing  a certain way.   She also notes that she has been dealing with some dizziness and vertigo that is dependent on head motion.  Her PCP is also evaluating this and has performed a carotid ultrasound.  This is associated with tinnitus.  Dx imaging: 01/13/21 L-spine, L hip, L rib XR  01/05/21 L rib XR  Pertinent review of systems: No fevers or chills  Relevant historical information: Diabetes.  History of breast cancer.   Exam:  BP 140/84 (BP Location: Right Arm, Patient Position: Sitting, Cuff Size: Normal)   Pulse 67   Ht 5' 8.5" (1.74 m)   Wt 165 lb (74.8 kg)   SpO2 98%   BMI 24.72 kg/m  General: Well Developed, well nourished, and in no acute distress.   MSK: Left ribs normal-appearing mildly tender normal motion.    Lab and Radiology Results EXAM: LEFT RIBS AND CHEST - 3+ VIEW   COMPARISON:  01/05/2021   FINDINGS: Slightly coarse lung markings appear unchanged. There is a left breast implant. No focal lung opacities or pulmonary edema. Heart size is within normal limits and stable. Negative for a pneumothorax. Probable minimally displaced fractures involving the left sixth and seventh ribs.   IMPRESSION: 1. Probable minimally displaced fractures involving the left sixth and seventh  ribs. 2.  Negative for a pneumothorax.     Electronically Signed   By: Markus Daft M.D.   On: 01/13/2021 17:00   I, Lynne Leader, personally (independently) visualized and performed the interpretation of the images attached in this note.      Assessment and Plan: 80 y.o. female with left rib fracture after a fall.  Already significantly improved with a bit of time and conservative management and just treating with physical therapy.  Plan to finish out PT course and check back with me as needed for this.    Vertigo: Primarily evaluated and managed with PCP.  She is already receiving physical therapy at [facility].  Vestibular PT should be helpful for this as well.  This is a service that is offered at this location.  We will add new referral to PT to add vestibular PT if able.   PDMP not reviewed this encounter. Orders Placed This Encounter  Procedures   Ambulatory referral to Physical Therapy    Referral Priority:   Routine    Referral Type:   Physical Medicine    Referral Reason:   Specialty Services Required    Requested Specialty:   Physical Therapy    Number of Visits Requested:   1   No orders of the defined types were placed in this encounter.    Discussed warning signs or symptoms. Please see discharge instructions. Patient expresses  understanding.   The above documentation has been reviewed and is accurate and complete Lynne Leader, M.D.

## 2021-02-11 ENCOUNTER — Encounter: Payer: Self-pay | Admitting: Family Medicine

## 2021-02-11 ENCOUNTER — Ambulatory Visit (INDEPENDENT_AMBULATORY_CARE_PROVIDER_SITE_OTHER): Payer: Medicare Other | Admitting: Family Medicine

## 2021-02-11 ENCOUNTER — Other Ambulatory Visit: Payer: Self-pay

## 2021-02-11 ENCOUNTER — Ambulatory Visit: Payer: Medicare Other | Admitting: Family Medicine

## 2021-02-11 VITALS — BP 140/84 | HR 67 | Ht 68.5 in | Wt 165.0 lb

## 2021-02-11 DIAGNOSIS — R42 Dizziness and giddiness: Secondary | ICD-10-CM

## 2021-02-11 DIAGNOSIS — S2242XD Multiple fractures of ribs, left side, subsequent encounter for fracture with routine healing: Secondary | ICD-10-CM | POA: Diagnosis not present

## 2021-02-11 NOTE — Patient Instructions (Signed)
Thank you for coming in today.   Continue PT  We can see if you can get vestibular PT as well.  Recheck as needed.

## 2021-02-11 NOTE — Progress Notes (Deleted)
OFFICE VISIT  02/11/2021  CC: No chief complaint on file.   HPI:    Patient is a 80 y.o. Caucasian female who presents for 3 wk f/u uncontrolled HTN as well as f/u HLD. She has DM2 and hypothyroidism-->managed by endocrinologist. A/P as of last visit: "1) Uncontrolled HTN. Add hctz 25 qd. Cont lisinopril 40 qd. BMET today. Get home bp cuff and check bp and hr daily. BMET upon f/u in 2-3 wks.   2) Disequilibrium syndrome. Unknown etiology. Check for sign of impaired posterior circ/vertebrals on carotid duplex ultrasound--ordered today."  INTERIM HX: ***  Carotid u/s after last visit:  <50% bilat internal carotid stenosis, otherwise normal. BMET normal last visit.  HTN:  HLD:   Past Medical History:  Diagnosis Date   Acute DVT (deep venous thrombosis) ([place]) 04/23/2019   Right popliteal   Anxiety and depression 1964   oncologist started duloxetine 12/2016   Breast cancer ([place]) 03/14/2016   Clinical stage 2A: (triple neg): Right breast, upper inner quadrant, 03/2016.  Neoadjuvant chemo x 5 cycles,lumpectomy 4 mo later, then RT started 10/2016.  Adjuvant Xeloda [id number].  SWOG research trial pt 04/2017--pt randomized to pembrolizumab immunotherapy.  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  A1c Mar 12, 2018 was 7.0% at Dr. Shirlyn Goltz.    Epidermoid cyst of vulva    Chronic epidermoid cyst of the vulva.  Excision done 02/2019   GERD (gastroesophageal reflux disease) 2013   Hyperlipidemia 1986   Hypertension 2008   Hypothyroidism 1988   Diagnosed in her 16s.  Managed by Endocrinologist   Lumbar radiculopathy  04/2019   Dr. Tamala Julian to get plain films of LB and hip (considering MRI due to her hx of cancer)   Osteoporosis 2015   pt states "osteopenia", but then says that she refused to take the rx med for this condition, so I suspect she had osteoporosis.   Peripheral neuropathy 2017   Patient states diabetic neuropathy in feet prior to starting chemotherapy and then worsened by chemo.    TIA (transient ischemic attack) 03/26/2011   2012: question of (HA + R eye "floaters"). CT in ED neg acute. Not admitted, no f/u testing done.  ?ocular migraine?    Past Surgical History:  Procedure Laterality Date   ABDOMINAL HYSTERECTOMY  1972   APPENDECTOMY  1972   BREAST ENHANCEMENT SURGERY  1982   BREAST IMPLANT REMOVAL Right 09/13/2016   Procedure: REMOVAL RIGHT BREAST IMPLANT;  Surgeon: Irene Limbo, MD;  Location: [facility];  Service: Plastics;  Laterality: Right;   BREAST LUMPECTOMY WITH RADIOACTIVE SEED AND SENTINEL LYMPH NODE BIOPSY Right 09/13/2016   Procedure: RIGHT BREAST LUMPECTOMY WITH RADIOACTIVE SEED X 2 AND SENTINEL LYMPH NODE BIOPSY;  Surgeon: Alphonsa Overall, MD;  Location: [facility];  Service: General;  Laterality: Right;   BREAST SURGERY Right 03/14/2016   Biopsy   CAPSULECTOMY Right 09/13/2016   Procedure: RIGHT CAPSULECTOMY;  Surgeon: Irene Limbo, MD;  Location: [facility];  Service: Plastics;  Laterality: Right;   CATARACT EXTRACTION, BILATERAL Bilateral 08/10/17 right eye, 08/31/17 left eye   MASS EXCISION  Left 02/28/2018   Path: benign.  Procedure: EXCISIONLEFT MEDIAL THIGH MASS ERAS PATHWAY;  Surgeon: Erroll Luna, MD;  Location: [facility];  Service: General;  Laterality: Left;   PORTACATH PLACEMENT N/A 03/15/2016   Procedure: INSERTION PORT-A-CATH WITH [place];  Surgeon: Alphonsa Overall, MD;  [facility];  Service: General;  Laterality: N/A;   PORTACATH REMOVAL  07/2017   surgical repair left ankle Left 2009   s/p  [place]   Age 80   US CAROTID DOPPLER BILATERAL ([place] [facility])  01/2021   <50% bilat int carotid sten, otherwise normal.    Outpatient Medications Prior to Visit  Medication Sig Dispense Refill   aspirin 81 MG EC tablet Take 1 tablet (81 mg total) by mouth daily. Swallow whole. 30 tablet 12   atorvastatin (LIPITOR) 80 MG tablet Take 80 mg by mouth every evening.      cholecalciferol (VITAMIN D3) 25 MCG (1000 UNIT) tablet Take 1,000 Units by mouth daily.     diclofenac Sodium (VOLTAREN) 1 % GEL Apply 2 g topically 4 (four) times daily. 150 g 0   DULoxetine (CYMBALTA) 30 MG capsule TAKE ONE CAPSULE BY MOUTH DAILY 30 capsule 5   estradiol (ESTRACE VAGINAL) 0.1 MG/GM vaginal cream Insert 1 gram vaginally twice weekly 42.5 g 0   hydrochlorothiazide (HYDRODIURIL) 25 MG tablet 1 tab po qd 30 tablet 1   ibuprofen (ADVIL) 600 MG tablet Take 1 tablet (600 mg total) by mouth every 6 (six) hours as needed. 30 tablet 0   levothyroxine (SYNTHROID) 112 MCG tablet 112 mcg daily before breakfast.      lisinopril (ZESTRIL) 40 MG tablet Take 1 tablet (40 mg total) by mouth daily. 30 tablet 0   metFORMIN (GLUCOPHAGE-XR) 500 MG 24 hr tablet Take 2 tablets (1,000 mg total) by mouth in the morning and at bedtime.     Propylene Glycol (SYSTANE BALANCE OP) Place 1 drop into both eyes daily as needed (for dry eyes).     tiZANidine (ZANAFLEX) 2 MG tablet Take 1-2 tablets (2-4 mg total) by mouth every 8 (eight) hours as needed for muscle spasms. Take mostly at bedtime 60 tablet 0   traZODone (DESYREL) 50 MG tablet Take 1 tablet (50 mg total) by mouth at bedtime. (Patient not taking: Reported on 02/02/2021) 90 tablet 3   No facility-administered medications prior to visit.    Allergies  Allergen Reactions   Other Other (See Comments)    STEROIDS- emotional   Prednisone Other (See Comments)    Other reaction(s): Mental Status Changes (intolerance)   Penicillins Other (See Comments)     Unsure of reaction, was 80 years old    ROS As per HPI  PE: Vitals with BMI 01/22/2021 01/13/2021 01/05/2021  Height 5' 8.5" 5' 8.5" 5' 8.5"  Weight 168 lbs 163 lbs 3 oz 164 lbs  BMI 25.17 [id number]  Systolic [id number]  Diastolic 77 80 82  Pulse 64 69 65     ***  LABS:    Chemistry      Component Value Date/Time   NA 138 01/22/2021 1331   NA 136 05/01/2017 0914   K 4.9 01/22/2021 1331   K 4.5 05/01/2017 0914   CL 101 01/22/2021 1331   CO2 27 01/22/2021 1331   CO2 24 05/01/2017 0914   BUN 16 01/22/2021 1331   BUN 14.9 05/01/2017 0914   CREATININE 0.80 01/22/2021 1331   CREATININE 0.83  11/03/2020 1129   CREATININE 0.8 05/01/2017 0914   GLU 181 09/20/2019 0000      Component Value Date/Time   CALCIUM 9.8 01/22/2021 1331   CALCIUM 9.6 05/01/2017 0914   ALKPHOS 65 11/03/2020 1129   ALKPHOS 75 05/01/2017 0914   AST 23 11/03/2020 1129   AST 51 (H) 05/01/2017 0914   ALT 34 11/03/2020 1129   ALT 62 (H) 05/01/2017 0914   BILITOT 1.4 (H) 11/03/2020 1129   BILITOT 1.30 (H) 05/01/2017 0914     Lab Results  Component Value Date   CHOL 195 03/23/2020   LDLCALC 92 03/23/2020   Lab Results  Component Value Date   TSH 2.33 03/23/2020   Lab Results  Component Value Date   HGBA1C 7.4 03/23/2020   Lab Results  Component Value Date   WBC 4.8 11/03/2020   HGB 14.1 11/03/2020   HCT 42.6 11/03/2020   MCV 99.1 11/03/2020   PLT 171 11/03/2020    IMPRESSION AND PLAN:  No problem-specific Assessment & Plan notes found for this encounter.   An After Visit Summary was printed and given to the patient.  FOLLOW UP: No follow-ups on file.  Signed:  Crissie Sickles, MD           02/11/2021

## 2021-02-16 ENCOUNTER — Other Ambulatory Visit: Payer: Self-pay | Admitting: Family Medicine

## 2021-02-18 ENCOUNTER — Ambulatory Visit: Payer: Medicare Other | Attending: Family Medicine

## 2021-02-18 ENCOUNTER — Ambulatory Visit (INDEPENDENT_AMBULATORY_CARE_PROVIDER_SITE_OTHER): Payer: Medicare Other | Admitting: Family Medicine

## 2021-02-18 ENCOUNTER — Encounter: Payer: Self-pay | Admitting: Family Medicine

## 2021-02-18 ENCOUNTER — Other Ambulatory Visit: Payer: Self-pay

## 2021-02-18 VITALS — BP 148/70 | HR 63 | Temp 98.2°F | Ht 68.5 in | Wt 163.6 lb

## 2021-02-18 DIAGNOSIS — R293 Abnormal posture: Secondary | ICD-10-CM | POA: Insufficient documentation

## 2021-02-18 DIAGNOSIS — R29898 Other symptoms and signs involving the musculoskeletal system: Secondary | ICD-10-CM | POA: Diagnosis not present

## 2021-02-18 DIAGNOSIS — M545 Low back pain, unspecified: Secondary | ICD-10-CM | POA: Diagnosis not present

## 2021-02-18 DIAGNOSIS — M6281 Muscle weakness (generalized): Secondary | ICD-10-CM | POA: Insufficient documentation

## 2021-02-18 DIAGNOSIS — R42 Dizziness and giddiness: Secondary | ICD-10-CM | POA: Insufficient documentation

## 2021-02-18 DIAGNOSIS — L989 Disorder of the skin and subcutaneous tissue, unspecified: Secondary | ICD-10-CM | POA: Diagnosis not present

## 2021-02-18 DIAGNOSIS — I10 Essential (primary) hypertension: Secondary | ICD-10-CM

## 2021-02-18 DIAGNOSIS — Z23 Encounter for immunization: Secondary | ICD-10-CM | POA: Diagnosis not present

## 2021-02-18 DIAGNOSIS — Z9181 History of falling: Secondary | ICD-10-CM | POA: Insufficient documentation

## 2021-02-18 DIAGNOSIS — R2681 Unsteadiness on feet: Secondary | ICD-10-CM | POA: Insufficient documentation

## 2021-02-18 DIAGNOSIS — R2689 Other abnormalities of gait and mobility: Secondary | ICD-10-CM | POA: Diagnosis not present

## 2021-02-18 DIAGNOSIS — H6192 Disorder of left external ear, unspecified: Secondary | ICD-10-CM

## 2021-02-18 NOTE — Therapy (Signed)
[facility] [address]  [place], [place], [address] Phone: [phone]   Fax:  [phone]  Physical Therapy Treatment  Patient Details  Name: Marissa Hall MRN: [id number] Date of Birth: Sep 23, 1940 Referring Provider (PT): Gregor Hams, MD   Encounter Date: 02/18/2021   PT End of Session - 02/18/21 1529     Visit Number 2    Number of Visits 16    Date for PT Re-Evaluation 03/30/21    Authorization Type Medicare & BCBS    PT Start Time [id number]    PT Stop Time 1529    PT Time Calculation (min) 41 min    Activity Tolerance Patient tolerated treatment well    Behavior During Therapy [facility] for tasks assessed/performed             Past Medical History:  Diagnosis Date   Acute DVT (deep venous thrombosis) ([place]) 04/23/2019   Right popliteal   Anxiety and depression 1964   oncologist started duloxetine 12/2016   Breast cancer ([place]) 03/14/2016   Clinical stage 2A: (triple neg): Right breast, upper inner quadrant, 03/2016.  Neoadjuvant chemo x 5 cycles,lumpectomy 4 mo later, then RT started 10/2016.  Adjuvant Xeloda [id number].  SWOG research trial pt 04/2017--pt randomized to pembrolizumab immunotherapy.  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  A1c Mar 12, 2018 was 7.0% at Dr. Shirlyn Goltz.    Epidermoid cyst of vulva    Chronic epidermoid cyst of the vulva.  Excision done 02/2019   GERD (gastroesophageal reflux disease) 2013   Hyperlipidemia 1986   Hypertension 2008   Hypothyroidism 1988   Diagnosed in her 45s.  Managed by Endocrinologist   Lumbar  radiculopathy 04/2019   Dr. Tamala Julian to get plain films of LB and hip (considering MRI due to her hx of cancer)   Osteoporosis 2015   pt states "osteopenia", but then says that she refused to take the rx med for this condition, so I suspect she had osteoporosis.   Peripheral neuropathy 2017   Patient states diabetic neuropathy in feet prior to starting chemotherapy and then worsened by chemo.    TIA (transient ischemic attack) 03/26/2011   2012: question of (HA + R eye "floaters"). CT in ED neg acute. Not admitted, no f/u testing done.  ?ocular migraine?    Past Surgical History:  Procedure Laterality Date   ABDOMINAL HYSTERECTOMY  1972   APPENDECTOMY  1972   BREAST ENHANCEMENT SURGERY  1982   BREAST IMPLANT REMOVAL Right 09/13/2016   Procedure: REMOVAL RIGHT BREAST IMPLANT;  Surgeon: Irene Limbo, MD;  Location: [facility];  Service: Plastics;  Laterality: Right;   BREAST LUMPECTOMY WITH RADIOACTIVE SEED AND SENTINEL LYMPH NODE BIOPSY Right 09/13/2016   Procedure: RIGHT BREAST LUMPECTOMY WITH RADIOACTIVE SEED X 2 AND SENTINEL LYMPH NODE BIOPSY;  Surgeon: Alphonsa Overall, MD;  Location: [facility];  Service: General;  Laterality: Right;   BREAST SURGERY Right 03/14/2016   Biopsy   CAPSULECTOMY Right 09/13/2016   Procedure: RIGHT CAPSULECTOMY;  Surgeon: Irene Limbo, MD;  Location: [facility];  Service: Clinical cytogeneticist;  Laterality: Right;   CATARACT EXTRACTION, BILATERAL Bilateral 08/10/17 right eye, 08/31/17 left eye   MASS EXCISION Left 02/28/2018   Path: benign.  Procedure: EXCISIONLEFT MEDIAL THIGH MASS ERAS PATHWAY;  Surgeon: Erroll Luna, MD;  Location: [facility];  Service: General;  Laterality: Left;   PORTACATH PLACEMENT N/A 03/15/2016   Procedure: INSERTION PORT-A-CATH WITH [place];  Surgeon: Alphonsa Overall, MD;  [facility];  Service: General;  Laterality: N/A;   PORTACATH REMOVAL  07/2017   surgical repair left ankle Left  2009   s/p [place]   Age 80   US CAROTID DOPPLER BILATERAL ([place] [facility])  01/2021   <50% bilat int carotid sten, otherwise normal.    There were no vitals filed for this visit.   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    Diagnostic tests 01/13/21 - Lumbar spine & L hip x-rays: negative; Rib x-rays: 1. Probable minimally displaced fractures involving the left sixth and seventh ribs.  2. Negative for a pneumothorax.    Patient Stated Goals "to feel more flexible and safer"    Currently in Pain? Yes    Pain Score 1     Pain Location Rib cage    Pain Orientation Right;Left    Pain Descriptors / Indicators Sore    Pain Type Acute pain                               [facility] Adult PT Treatment/Exercise - 02/18/21 0001       Exercises   Exercises Lumbar      Lumbar Exercises: Stretches   Active Hamstring Stretch Right;Left;2 reps;30 seconds    Active Hamstring Stretch Limitations seated hip hinge    Standing Side Bend Limitations reviewed    Standing Extension 10 reps    Piriformis Stretch Right;Left;2 reps;30 seconds    Piriformis Stretch Limitations seated KTOS    Figure 4 Stretch 2 reps;30 seconds;Seated;With overpressure      Lumbar Exercises: Aerobic   Nustep [id number]      Lumbar Exercises: Standing   Other Standing Lumbar Exercises church pews with counter support 10 reps   cues for proper WS     Lumbar Exercises: Supine   Clam 10 reps;3 seconds    Clam Limitations yellow TB    Bent Knee Raise 10 reps;3 seconds    Bent Knee Raise Limitations yellow TB    Bridge 10 reps    Bridge Limitations instability noted                       PT Short Term Goals - 02/18/21 1530       PT SHORT TERM GOAL #1   Title Patient will be independent with initial HEP    Status  On-going    Target Date 03/02/21      PT SHORT TERM GOAL #2   Title Patient will verbalize/demonstrate good awareness of neutral spine posture and proper body mechanics for daily tasks    Status On-going    Target Date 03/02/21      PT SHORT TERM GOAL #3   Title Decrease L flank pain by >/= 25-50% allowing patient increased ease of bed mobility  and transitional movements    Status On-going    Target Date 03/02/21      PT SHORT TERM GOAL #4   Title Complete balance screen and set goal for improvement    Status On-going    Target Date 03/02/21               PT Long Term Goals - 02/18/21 1530       PT LONG TERM GOAL #1   Title Patient will be independent with ongoing/advanced HEP for self-management at home in order to build upon functional gains in therapy    Status On-going      PT LONG TERM GOAL #2   Title Patient to demonstrate ability to achieve and maintain good spinal alignment/posturing    Status On-going      PT LONG TERM GOAL #3   Title Decrease L flank pain by >/= 50-75% allowing patient increased ease of bed mobility and transitional movements    Status On-going      PT LONG TERM GOAL #4   Title Patient to improve lumbopelvic flexibilty and AROM to [facility] without pain provocation    Status On-going      PT LONG TERM GOAL #5   Title Patient to report ability to perform ADLs, household, and work-related tasks without limitation due to L flank pain, LOM or weakness    Status On-going                   Plan - 02/18/21 1531     Clinical Impression Statement Progressed exercises today focusing on hip strengthening and mobility. She tolerated the session well w/o any increased pain. She showed instability during the bridges. I educated her on being more careful of quick, jerky movements and to turn with her body when changing directions slowly to reduce the chance of becoming unsteady. Also advised her to keep a cane or walking stick with her when she gets  up at night for more stability. SHe did note that she feels "dizzy" when walking at times and believes that it is vertigo, however MD referral is not for vertigo. Pt w/o any complaints post session.    Personal Factors and Comorbidities Age;Fitness;Past/Current Experience;Time since onset of injury/illness/exacerbation;Comorbidity 3+    Comorbidities Osteoporosis, ORIF L ankle fracture 2009, HTN, DM-II, hypothyroidism, DVT, breast cancer, piriformis syndrome, TIA, GERD, anxiety and depression    PT Frequency 2x / week    PT Duration 8 weeks    PT Treatment/Interventions ADLs/Self Care Home Management;Cryotherapy;Electrical Stimulation;Iontophoresis '4mg'$ /ml Dexamethasone;Moist Heat;Ultrasound;DME Instruction;Gait training;Stair training;Functional mobility training;Therapeutic activities;Therapeutic exercise;Balance training;Neuromuscular re-education;Patient/family education;Manual techniques;Passive range of motion;Dry needling;Taping;Spinal Manipulations;Joint Manipulations    PT Next Visit Plan initate lumbopelvic mobility and strengthening; propriceptive exercises; manual therapy and modalities PRN    PT Home Exercise Plan Access Code: [email or web address] (8/30)    Consulted and Agree with Plan of Care Patient             Patient will benefit from skilled therapeutic intervention in order to improve the following deficits and impairments:  Decreased activity tolerance, Decreased balance, Decreased knowledge of precautions, Decreased mobility, Decreased range of motion, Decreased safety awareness, Decreased strength, Difficulty walking, Increased fascial restricitons, Increased muscle spasms, Impaired perceived functional ability, Impaired flexibility, Improper body mechanics, Postural dysfunction, Pain  Visit Diagnosis: Acute left-sided low back pain without sciatica  Other symptoms and signs involving the musculoskeletal system  Other abnormalities of gait and mobility  Muscle weakness  (generalized)  Abnormal posture  Unsteadiness  on feet  History of falling     Problem List 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   Diabetes mellitus type 2 without retinopathy ([place]) 09/21/2020   Meibomian gland dysfunction (MGD) of both eyes 09/21/2020   History of COVID-19 06/23/2020   Piriformis syndrome of right side 05/16/2019   Lower extremity pain, right 04/23/2019   DVT (deep venous thrombosis) ([place]) 04/23/2019   Lumbar radiculopathy, right 04/02/2019   Other long term (current) drug therapy 06/14/2017   Anxiety and depression    History of breast cancer 03/02/2017   History of therapeutic radiation 03/02/2017   Breast pain, right 10/03/2016   Type 2 diabetes mellitus ([place]) 08/23/2016   Hypertension associated with diabetes ([place]) 08/23/2016   Hypothyroidism 08/23/2016   Port catheter in place 05/09/2016   Peripheral neuropathy 05/09/2016   Breast cancer of upper-inner quadrant of right female breast ([place]) 03/09/2016   Major depressive disorder, recurrent ([place]) [id number]   Uncomplicated alcohol dependence ([place]) 02/19/2014    Artist Pais, PTA 02/18/2021, 4:40 PM  [facility] [address]  [place], [place], [address] Phone: [phone]   Fax:  [phone]  Name: Marissa Hall MRN: [id number] Date of Birth: 1941/03/03
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
## 2021-02-19 ENCOUNTER — Other Ambulatory Visit: Payer: Self-pay

## 2021-02-19 ENCOUNTER — Encounter: Payer: Self-pay | Admitting: Family Medicine

## 2021-02-19 LAB — BASIC METABOLIC PANEL
BUN: 19 mg/dL (ref 6–23)
CO2: 25 mEq/L (ref 19–32)
Calcium: 10.3 mg/dL (ref 8.4–10.5)
Chloride: 99 mEq/L (ref 96–112)
Creatinine, Ser: 1.11 mg/dL (ref 0.40–1.20)
GFR: 47.84 mL/min — ABNORMAL LOW (ref >60.00)
Glucose, Bld: 184 mg/dL — ABNORMAL HIGH (ref 70–99)
Potassium: 4.4 mEq/L (ref 3.5–5.1)
Sodium: 133 mEq/L — ABNORMAL LOW (ref 135–145)

## 2021-02-19 MED ORDER — AMLODIPINE BESYLATE 10 MG PO TABS: 10.0000 mg | ORAL_TABLET | Freq: Every day | ORAL | 1 refills | Status: DC

## 2021-02-22 ENCOUNTER — Ambulatory Visit: Payer: Medicare Other

## 2021-02-23 ENCOUNTER — Other Ambulatory Visit: Payer: Self-pay

## 2021-02-23 ENCOUNTER — Ambulatory Visit (INDEPENDENT_AMBULATORY_CARE_PROVIDER_SITE_OTHER): Payer: Medicare Other | Admitting: Internal Medicine

## 2021-02-23 VITALS — BP 124/78 | HR 60 | Ht 68.5 in | Wt 168.0 lb

## 2021-02-23 DIAGNOSIS — E1142 Type 2 diabetes mellitus with diabetic polyneuropathy: Secondary | ICD-10-CM

## 2021-02-23 DIAGNOSIS — E785 Hyperlipidemia, unspecified: Secondary | ICD-10-CM

## 2021-02-23 DIAGNOSIS — E039 Hypothyroidism, unspecified: Secondary | ICD-10-CM | POA: Diagnosis not present

## 2021-02-23 LAB — POCT GLYCOSYLATED HEMOGLOBIN (HGB A1C): Hemoglobin A1C: 7.2 % — AB (ref 4.0–5.6)

## 2021-02-23 LAB — GLUCOSE, POCT (MANUAL RESULT ENTRY): POC Glucose: 169 mg/dl — AB (ref 70–99)

## 2021-02-23 MED ORDER — METFORMIN HCL ER 500 MG PO TB24: 500.0000 mg | ORAL_TABLET | Freq: Two times a day (BID) | ORAL | 3 refills | Status: DC

## 2021-02-23 NOTE — Patient Instructions (Signed)
-   Rybelsus 3 mg, if no problems in 2 weeks, send me a message to prescribe the 7 mg dose - Metformin 500 mg 1 tablet twice a day      HOW TO TREAT LOW BLOOD SUGARS (Blood sugar LESS THAN 70 MG/DL) Please follow the RULE OF 15 for the treatment of hypoglycemia treatment (when your (blood sugars are less than 70 mg/dL)   STEP 1: Take 15 grams of carbohydrates when your blood sugar is low, which includes:  3-4 GLUCOSE TABS  OR 3-4 OZ OF JUICE OR REGULAR SODA OR ONE TUBE OF GLUCOSE GEL    STEP 2: RECHECK blood sugar in 15 MINUTES STEP 3: If your blood sugar is still low at the 15 minute recheck --> then, go back to STEP 1 and treat AGAIN with another 15 grams of carbohydrates.

## 2021-02-23 NOTE — Progress Notes (Signed)
Name: Marissa Hall  MRN/ DOB: [id number], [birth year]   Age/ Sex: 80 y.o., female    PCP: Tammi Sou, MD   Reason for Endocrinology Evaluation: Type 2 Diabetes Mellitus     Date of Initial Endocrinology Visit: 02/23/2021     PATIENT IDENTIFIER: [name] is a 80 y.o. female with a past medical history of T2DM, HTN, Dyslipidemia, Hx of Breast ca  and Hypothyroidism. The patient presented for initial endocrinology clinic visit on 02/23/2021 for consultative assistance with her diabetes management.    HPI: Marissa Hall was    Diagnosed with DM 2008 Prior Medications tried/Intolerance: Metformin - diarrhea to higher doses  Currently checking blood sugars occasionally  x / day.  Hypoglycemia episodes : no       Hemoglobin A1c has ranged from 7.0% in 2019, peaking at 7.4% in 2021. Patient required assistance for hypoglycemia: no Patient has required hospitalization within the last 1 year from hyper or hypoglycemia: no   In terms of diet, the patient eats 3 meals a day, does not snack as much . Avoids sugar sweetened    Her endocrinologist attempted to start her on Rybelsus but could not keep up with the 30 minute.   Transitioned from Dr. Hartford Poli     THYROID HISTORY: She has been diagnosed with hypothyroidism in  her 68's. She has no Hx of thyroid sx or RAI ablation. Has been on synthoid. Can not take generic   Denies local neck symptoms Denies constipation  Has neuropathy - chemo and DM  NO biotin     HOME DIABETES REGIMEN: Metformin 500 mg XR 1 tab BID    Statin: yes ACE-I/ARB: yes    METER DOWNLOAD SUMMARY: Did not bring    DIABETIC COMPLICATIONS: Microvascular complications:  Neuropathy Denies: CKD III  Last eye exam: Completed 2022  Macrovascular complications:   Denies: CAD, PVD, CVA   PAST HISTORY: Past Medical History:  Past Medical History:  Diagnosis Date   Acute DVT (deep venous thrombosis) ([place]) 04/23/2019   Right popliteal    Anxiety and depression 1964   oncologist started duloxetine 12/2016   Breast cancer ([place]) 03/14/2016   Clinical stage 2A: (triple neg): Right breast, upper inner quadrant, 03/2016.  Neoadjuvant chemo x 5 cycles,lumpectomy 4 mo later, then RT started 10/2016.  Adjuvant Xeloda [id number].  SWOG research trial pt 04/2017--pt randomized to pembrolizumab immunotherapy.  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  A1c Mar 12, 2018 was 7.0% at Dr. Shirlyn Goltz.    Epidermoid cyst of vulva    Chronic epidermoid cyst of the vulva.  Excision done 02/2019   GERD (gastroesophageal reflux disease) 2013   Hyperlipidemia 1986   Hypertension 2008   2022 ->addition of hctz to lisinopril led to 42ml drop in GFR   Hypothyroidism 1988   Diagnosed in her 58s.  Managed by Endocrinologist   Lumbar radiculopathy 04/2019   Dr. Tamala Julian to get plain films of LB and hip (considering MRI due to her hx of cancer)   Osteoporosis 2015   pt states "osteopenia", but then says that she refused to take the rx med  for this condition, so I suspect she had osteoporosis.   Peripheral neuropathy 2017   Patient states diabetic neuropathy in feet prior to starting chemotherapy and then worsened by chemo.    TIA (transient ischemic attack) 03/26/2011   2012: question of (HA + R eye "floaters"). CT in ED neg acute. Not admitted, no f/u testing done.  ?ocular migraine?   Past Surgical History:  Past Surgical History:  Procedure Laterality Date   ABDOMINAL HYSTERECTOMY  1972   APPENDECTOMY  1972   BREAST ENHANCEMENT SURGERY  1982   BREAST IMPLANT REMOVAL Right 09/13/2016    Procedure: REMOVAL RIGHT BREAST IMPLANT;  Surgeon: Irene Limbo, MD;  Location: [facility];  Service: Plastics;  Laterality: Right;   BREAST LUMPECTOMY WITH RADIOACTIVE SEED AND SENTINEL LYMPH NODE BIOPSY Right 09/13/2016   Procedure: RIGHT BREAST LUMPECTOMY WITH RADIOACTIVE SEED X 2 AND SENTINEL LYMPH NODE BIOPSY;  Surgeon: Alphonsa Overall, MD;  Location: [facility];  Service: General;  Laterality: Right;   BREAST SURGERY Right 03/14/2016   Biopsy   CAPSULECTOMY Right 09/13/2016   Procedure: RIGHT CAPSULECTOMY;  Surgeon: Irene Limbo, MD;  Location: [facility];  Service: Plastics;  Laterality: Right;   CATARACT EXTRACTION, BILATERAL Bilateral 08/10/17 right eye, 08/31/17 left eye   MASS EXCISION Left 02/28/2018   Path: benign.  Procedure: EXCISIONLEFT MEDIAL THIGH MASS ERAS PATHWAY;  Surgeon: Erroll Luna, MD;  Location: [facility];  Service: General;  Laterality: Left;   PORTACATH PLACEMENT N/A 03/15/2016   Procedure: INSERTION PORT-A-CATH WITH [place];  Surgeon: Alphonsa Overall, MD;  [facility];  Service: General;  Laterality: N/A;   PORTACATH REMOVAL  07/2017   surgical repair left ankle Left 2009   s/p [place]   Age 66   US CAROTID DOPPLER BILATERAL ([place] [facility]X)  01/2021   <50% bilat int carotid sten, otherwise normal.    Social History:  reports that she quit smoking about 21 years ago. Her smoking use included cigarettes. She smoked an average of 1 pack per day. She has never used smokeless tobacco. She reports current alcohol use of about 7.0 standard drinks per week. She reports that she does not use drugs. Family History:  Family History  Problem Relation Age of Onset   Stroke Mother    Suicidality Father    Stroke Brother    Stroke Son      HOME MEDICATIONS: Allergies as of 02/23/2021       Reactions   Other Other (See Comments)   STEROIDS- emotional   Prednisone Other (See  Comments)   Other reaction(s): Mental Status Changes (intolerance)   Penicillins Other (See Comments)   Unsure of reaction, was 80 years old        Medication List        Accurate as of February 23, 2021  2:23 PM. If you have any questions, ask your nurse or doctor.          amLODipine 10 MG tablet Commonly known as: NORVASC Take 1 tablet (10 mg total) by mouth daily.   aspirin 81 MG EC tablet Take 1 tablet (81 mg total) by mouth daily. Swallow whole.   atorvastatin 80 MG tablet Commonly known as: LIPITOR Take 80 mg by mouth every evening.   cholecalciferol 25 MCG (1000 UNIT) tablet Commonly known as: VITAMIN D3 Take 1,000 Units by mouth daily.   diclofenac Sodium 1 % Gel Commonly known  as: VOLTAREN Apply 2 g topically 4 (four) times daily.   DULoxetine 30 MG capsule Commonly known as: CYMBALTA TAKE ONE CAPSULE BY MOUTH DAILY   estradiol 0.1 MG/GM vaginal cream Commonly known as: ESTRACE VAGINAL Insert 1 gram vaginally twice weekly   ibuprofen 600 MG tablet Commonly known as: ADVIL Take 1 tablet (600 mg total) by mouth every 6 (six) hours as needed.   levothyroxine 112 MCG tablet Commonly known as: SYNTHROID 112 mcg daily before breakfast.   lisinopril 40 MG tablet Commonly known as: ZESTRIL Take 1 tablet (40 mg total) by mouth daily.   metFORMIN 500 MG 24 hr tablet Commonly known as: GLUCOPHAGE-XR Take 2 tablets (1,000 mg total) by mouth in the morning and at bedtime.   SYSTANE BALANCE OP Place 1 drop into both eyes daily as needed (for dry eyes).   tiZANidine 2 MG tablet Commonly known as: ZANAFLEX Take 1-2 tablets (2-4 mg total) by mouth every 8 (eight) hours as needed for muscle spasms. Take mostly at bedtime   traZODone 50 MG tablet Commonly known as: DESYREL Take 1 tablet (50 mg total) by mouth at bedtime.         ALLERGIES: Allergies  Allergen Reactions   Other Other (See Comments)    STEROIDS- emotional   Prednisone Other (See  Comments)    Other reaction(s): Mental Status Changes (intolerance)   Penicillins Other (See Comments)    Unsure of reaction, was 80 years old     REVIEW OF SYSTEMS: A comprehensive ROS was conducted with the patient and is negative except as per HPI     OBJECTIVE:   VITAL SIGNS: BP 124/78 (BP Location: Left Arm, Patient Position: Sitting, Cuff Size: Small)   Pulse 60   Ht 5' 8.5" (1.74 m)   Wt 168 lb (76.2 kg)   SpO2 99%   BMI 25.17 kg/m    PHYSICAL EXAM:  General: Pt appears well and is in NAD  Neck: General: Supple without adenopathy or carotid bruits. Thyroid: Thyroid size normal.  No goiter or nodules appreciated.   Lungs: Clear with good BS bilat with no rales, rhonchi, or wheezes  Heart: RRR with normal S1 and S2 and no gallops; no murmurs; no rub  Abdomen: Normoactive bowel sounds, soft, nontender, without masses or organomegaly palpable  Extremities:  Lower extremities - No pretibial edema. No lesions.  Skin: Normal texture and temperature to palpation. No rash noted.   Neuro: MS is good with appropriate affect, pt is alert and Ox3    DM foot exam: 9/20/222   The skin of the feet is intact without sores or ulcerations. The pedal pulses are 2+ on right and 2+ on left. The sensation is decreased to a screening 5.07, 10 gram monofilament bilaterally   DATA REVIEWED:  Lab Results  Component Value Date   HGBA1C 7.2 (A) 02/23/2021   HGBA1C 7.4 03/23/2020   HGBA1C 7.2 09/20/2019   Lab Results  Component Value Date   LDLCALC 92 03/23/2020   CREATININE 1.11 02/18/2021   No results found for: MICRALBCREAT  Lab Results  Component Value Date   CHOL 195 03/23/2020   [place] 92 03/23/2020        ASSESSMENT / PLAN / RECOMMENDATIONS:   1) Type 2 Diabetes Mellitus, Sub-Optimally controlled, With Neuropathic complications - Most recent A1c of 7.2  %. Goal A1c < 7.0 %.    - I have discussed with the patient the pathophysiology of diabetes. We stressed the  importance  of lifestyle changes including diet . I explained the complications associated with diabetes including retinopathy, nephropathy, neuropathy as well as increased risk of cardiovascular disease. We went over the benefit seen with glycemic control.   - She has Rybelsus at home that was prescribed by her previous endocrinologist , she will give this a try by taking it with Synthroid, if she tolerates it, she will contact me through the portal to prescribe the 7 mg dose.     MEDICATIONS: Continue Metformin 500 mg BID  Restart Rybelsys 3 mg daily   EDUCATION / INSTRUCTIONS: BG monitoring instructions: Patient is instructed to check her blood sugars 2 times a day, fasting and bedtime . Call [facility] if: BG persistently < 70 I reviewed the Rule of 15 for the treatment of hypoglycemia in detail with the patient. Literature supplied.   2) Diabetic complications:  Eye: Does not have known diabetic retinopathy.  Neuro/ Feet: Does  have known diabetic peripheral neuropathy. Renal: Patient does not have known baseline CKD. She is on an ACEI/ARB at present.  3)  Hypothyroidism :  - Pt is clinically euthyroid  - No local neck symptoms  - - Pt educated extensively on the correct way to take levothyroxine (first thing in the morning with water, 30 minutes before eating or taking other medications). - Pt encouraged to double dose the following day if she were to miss a dose given long half-life of levothyroxine. - She can only do BRAND   Medication    Synthroid 112 mcg daily      4) Dyslipidemia :  - She had ran out of statin for the past week but she will pick it up in 2 days - LDL and Tg very high, will recheck while on Statin, if this continues to be elevated, will add Zetia    Medication  Atorvastatin 80 mg daily    F/U in 3-4 months   Signed electronically by: Mack Guise, MD  [facility]  [facility] [address] [place], [place] [address] Phone: [phone] FAX: [phone]   CC: Tammi Sou, MD [address] [place] [address] Phone: [phone]  Fax: [phone]    Return to Endocrinology clinic as below: Future Appointments  Date Time Provider [place]  02/25/2021  2:00 PM Rennie Natter, [place] [facility]  03/03/2021  5:00 PM Rennie Natter, PT [facility]  03/11/2021  3:30 PM Artist Pais, PTA [facility]  03/15/2021  2:45 PM Artist Pais, PTA [facility]  03/22/2021  2:45 PM Rennie Natter, PT [facility]  03/25/2021  2:00 PM Percival Spanish, PT [facility]  04/20/2021 10:15 AM Nicholas Lose, MD [facility] None

## 2021-02-24 LAB — LIPID PANEL
Cholesterol: 311 mg/dL — ABNORMAL HIGH (ref 0–200)
HDL: 64.1 mg/dL (ref >39.00)
NonHDL: 246.87
Total CHOL/HDL Ratio: 5
Triglycerides: 242 mg/dL — ABNORMAL HIGH (ref 0.0–149.0)
VLDL: 48.4 mg/dL — ABNORMAL HIGH (ref 0.0–40.0)

## 2021-02-24 LAB — MICROALBUMIN / CREATININE URINE RATIO
Creatinine,U: 80.9 mg/dL
Microalb Creat Ratio: 1 mg/g (ref 0.0–30.0)
Microalb, Ur: 0.8 mg/dL (ref 0.0–1.9)

## 2021-02-24 LAB — LDL CHOLESTEROL, DIRECT: Direct LDL: 218 mg/dL

## 2021-02-24 LAB — TSH: TSH: 4.54 u[IU]/mL (ref 0.35–5.50)

## 2021-02-25 ENCOUNTER — Encounter: Payer: Self-pay | Admitting: Physical Therapy

## 2021-02-25 ENCOUNTER — Encounter: Payer: Self-pay | Admitting: Internal Medicine

## 2021-02-25 ENCOUNTER — Ambulatory Visit: Payer: Medicare Other | Admitting: Physical Therapy

## 2021-02-25 ENCOUNTER — Other Ambulatory Visit: Payer: Self-pay

## 2021-02-25 VITALS — BP 160/82

## 2021-02-25 DIAGNOSIS — M545 Low back pain, unspecified: Secondary | ICD-10-CM | POA: Diagnosis not present

## 2021-02-25 DIAGNOSIS — R2681 Unsteadiness on feet: Secondary | ICD-10-CM | POA: Diagnosis not present

## 2021-02-25 DIAGNOSIS — M6281 Muscle weakness (generalized): Secondary | ICD-10-CM | POA: Diagnosis not present

## 2021-02-25 DIAGNOSIS — R293 Abnormal posture: Secondary | ICD-10-CM | POA: Diagnosis not present

## 2021-02-25 DIAGNOSIS — Z9181 History of falling: Secondary | ICD-10-CM

## 2021-02-25 DIAGNOSIS — R29898 Other symptoms and signs involving the musculoskeletal system: Secondary | ICD-10-CM | POA: Diagnosis not present

## 2021-02-25 DIAGNOSIS — R2689 Other abnormalities of gait and mobility: Secondary | ICD-10-CM | POA: Diagnosis not present

## 2021-02-25 DIAGNOSIS — R42 Dizziness and giddiness: Secondary | ICD-10-CM

## 2021-02-25 MED ORDER — ATORVASTATIN CALCIUM 80 MG PO TABS: 80.0000 mg | ORAL_TABLET | Freq: Every evening | ORAL | 3 refills | Status: DC

## 2021-02-25 MED ORDER — LEVOTHYROXINE SODIUM 112 MCG PO TABS: 112.0000 ug | ORAL_TABLET | Freq: Every day | ORAL | 3 refills | Status: DC

## 2021-02-25 NOTE — Patient Instructions (Signed)
Access Code: [email or web address] URL: [email or web address] Date: 02/25/2021 Prepared by: Glenetta Hew  Exercises Seated Gaze Stabilization with Head Rotation - 3 x daily - 7 x weekly - 3 sets - 10 reps Seated Gaze Stabilization with Head Nod - 3 x daily - 7 x weekly - 3 sets - 10 reps

## 2021-02-25 NOTE — Therapy (Signed)
[facility] [address]  [place], [place], [address] Phone: [phone]   Fax:  [phone]  Physical Therapy Evaluation  Patient Details  Name: Marissa Hall MRN: [id number] Date of Birth: [birth year] Referring Provider (PT): Gregor Hams, MD   Encounter Date: 02/25/2021   PT End of Session - 02/25/21 1853     Visit Number 3    Number of Visits 16    Date for PT Re-Evaluation 03/30/21    Authorization Type Medicare & BCBS    PT Start Time 1405    PT Stop Time 6789    PT Time Calculation (min) 40 min    Activity Tolerance Patient tolerated treatment well    Behavior During Therapy [facility] for tasks assessed/performed             Past Medical History:  Diagnosis Date   Acute DVT (deep venous thrombosis) ([place]) 04/23/2019   Right popliteal   Anxiety and depression 1964   oncologist started duloxetine 12/2016   Breast cancer ([place]) 03/14/2016   Clinical stage 2A: (triple neg): Right breast, upper inner quadrant, 03/2016.  Neoadjuvant chemo x 5 cycles,lumpectomy 4 mo later, then RT started 10/2016.  Adjuvant Xeloda [id number].  SWOG research trial pt 04/2017--pt randomized to pembrolizumab immunotherapy.  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  A1c Mar 12, 2018 was 7.0% at Dr. Shirlyn Goltz.    Epidermoid cyst of vulva    Chronic epidermoid cyst of the vulva.  Excision done 02/2019   GERD (gastroesophageal reflux disease) 2013   Hyperlipidemia 1986   Hypertension 2008   2022 ->addition of hctz to lisinopril led to 58ml drop in GFR   Hypothyroidism 1988    Diagnosed in her 17s.  Managed by Endocrinologist   Lumbar radiculopathy 04/2019   Dr. Tamala Julian to get plain films of LB and hip (considering MRI due to her hx of cancer)   Osteoporosis 2015   pt states "osteopenia", but then says that she refused to take the rx med for this condition, so I suspect she had osteoporosis.   Peripheral neuropathy 2017   Patient states diabetic neuropathy in feet prior to starting chemotherapy and then worsened by chemo.    TIA (transient ischemic attack) 03/26/2011   2012: question of (HA + R eye "floaters"). CT in ED neg acute. Not admitted, no f/u testing done.  ?ocular migraine?    Past Surgical History:  Procedure Laterality Date   ABDOMINAL HYSTERECTOMY  1972   APPENDECTOMY  1972   BREAST ENHANCEMENT SURGERY  1982   BREAST IMPLANT REMOVAL Right 09/13/2016   Procedure: REMOVAL RIGHT BREAST IMPLANT;  Surgeon: Irene Limbo, MD;  Location: [facility];  Service: Plastics;  Laterality: Right;   BREAST LUMPECTOMY WITH RADIOACTIVE SEED AND SENTINEL LYMPH NODE BIOPSY Right 09/13/2016   Procedure: RIGHT BREAST LUMPECTOMY WITH RADIOACTIVE SEED X 2 AND SENTINEL LYMPH NODE BIOPSY;  Surgeon: Alphonsa Overall, MD;  Location: [facility];  Service: General;  Laterality: Right;   BREAST SURGERY Right 03/14/2016   Biopsy   CAPSULECTOMY Right 09/13/2016  Procedure: RIGHT CAPSULECTOMY;  Surgeon: Irene Limbo, MD;  Location: [facility];  Service: Plastics;  Laterality: Right;   CATARACT EXTRACTION, BILATERAL Bilateral 08/10/17 right eye, 08/31/17 left eye   MASS EXCISION Left 02/28/2018   Path: benign.  Procedure: EXCISIONLEFT MEDIAL THIGH MASS ERAS PATHWAY;  Surgeon: Erroll Luna, MD;  Location: [facility];  Service: General;  Laterality: Left;   PORTACATH PLACEMENT N/A 03/15/2016   Procedure: INSERTION PORT-A-CATH WITH [place];  Surgeon: Alphonsa Overall, MD;  Location: [facility];  Service: General;  Laterality: N/A;    PORTACATH REMOVAL  07/2017   surgical repair left ankle Left 2009   s/p [place]   Age 80   US CAROTID DOPPLER BILATERAL ([place] [facility])  01/2021   <50% bilat int carotid sten, otherwise normal.    Vitals:   02/25/21 1426  BP: (!) 160/82      Subjective Assessment - 02/25/21 1847     Subjective Patient reports that she gets dizzy and loses her balance with head movements, especially looking up to fill bird feeder, and looking down to navigate steps.  She also has tinnitus.  She reports these symptoms started last year around the same time she had COVID.  She thinks these were part of what made her fall.  She is currently in PT for hip pain following her fall, but her doctor thought she would benefit from vestibular training as well and referred her to add onto her POC.    Pertinent History Vertigo, HTN, T2DM, hypothyroidism    Limitations Walking;Standing    Diagnostic tests 01/13/21 - Lumbar spine & L hip x-rays: negative; Rib x-rays: 1. Probable minimally displaced fractures involving the left sixth and seventh ribs.  2. Negative for a pneumothorax.    Patient Stated Goals to be able to move head and not feel dizzy    Currently in Pain? Yes    Pain Score 3     Pain Location Hip                        Vestibular Assessment - 02/25/21 0001       Vestibular Assessment   General Observation patient enters independently without AD and no apparent distress      Symptom Behavior   Subjective history of current problem Patient reports vertigo symptoms for more than 6 months.  She had covid vaccination spring 2021, then covid in dec 2021, very sick and hospitalized.  Thinks the symptoms started around the same time as COVID infection.  Also has tinnitus    Type of Dizziness  Unsteady with head/body turns;Imbalance    Frequency of Dizziness daily depending on head movements.    Duration of Dizziness brief, stops when head returns to neutral     Symptom Nature Motion provoked;Positional;Intermittent    Aggravating Factors Looking up to the ceiling;Mornings;Sitting in moving car;Forward bending    Relieving Factors Head stationary;Slow movements    Progression of Symptoms No change since onset    History of similar episodes none      Oculomotor Exam   Oculomotor Alignment Normal    Spontaneous Absent    Gaze-induced  Absent    Head shaking Horizontal Absent    Head Shaking Vertical Absent    Smooth Pursuits Saccades   vertical   Saccades Poor trajectory      Oculomotor Exam-Fixation Suppressed    Left Head Impulse negative  Right Head Impulse negative    Head Shaking Nystagmus-Horizontal negative      Vestibulo-Ocular Reflex   VOR 1 Head Only (x 1 viewing) increased dizziness, especially vertical      Positional Sensitivities   Nose to Right Knee No dizziness    Right Knee to Sitting Mild dizziness    Nose to Left Knee No dizziness    Left Knee to Sitting Mild dizziness    Head Turning x 5 No dizziness    Head Nodding x 5 Mild dizziness      Orthostatics   BP sitting 160/82    BP standing (after 1 minute) 160/82    BP standing (after 3 minutes) 160/82    Orthostatics Comment educated on orthostatis due to history of fall at night after getting up from bed                Objective measurements completed on examination: See above findings.        Vestibular Treatment/Exercise - 02/25/21 0001       Vestibular Treatment/Exercise   Gaze Exercises X1 Viewing Horizontal;X1 Viewing Vertical      X1 Viewing Horizontal   Foot Position seated    Reps 30      X1 Viewing Vertical   Foot Position seated    Reps 30                    PT Education - 02/25/21 1852     Education Details educated on findings and plan of care for vestibular rehab, given gaze stabilizaiton exercises Access Code: [email or web address]    Person(s) Educated Patient    Methods Explanation;Demonstration;Handout    Comprehension  Verbalized understanding;Returned demonstration;Need further instruction              PT Short Term Goals - 02/25/21 1859       PT SHORT TERM GOAL #1   Title Patient will be independent with initial HEP    Status On-going    Target Date 03/02/21      PT SHORT TERM GOAL #2   Title Patient will verbalize/demonstrate good awareness of neutral spine posture and proper body mechanics for daily tasks    Status On-going    Target Date 03/02/21      PT SHORT TERM GOAL #3   Title Decrease L flank pain by >/= 25-50% allowing patient increased ease of bed mobility and transitional movements    Status On-going    Target Date 03/02/21      PT SHORT TERM GOAL #4   Title Complete balance screen and set goal for improvement    Status On-going   5x STS 22 sec (>14 sec indicates balance dysfunction)   Target Date 03/02/21               PT Long Term Goals - 02/25/21 1900       PT LONG TERM GOAL #1   Title Patient will be independent with ongoing/advanced HEP for self-management at home in order to build upon functional gains in therapy    Status On-going      PT LONG TERM GOAL #2   Title Patient to demonstrate ability to achieve and maintain good spinal alignment/posturing    Status On-going      PT LONG TERM GOAL #3   Title Decrease L flank pain by >/= 50-75% allowing patient increased ease of bed mobility and transitional movements    Status On-going  PT LONG TERM GOAL #4   Title Patient to improve lumbopelvic flexibilty and AROM to [facility] without pain provocation    Status On-going      PT LONG TERM GOAL #5   Title Patient to report ability to perform ADLs, household, and work-related tasks without limitation due to L flank pain, LOM or weakness    Status On-going      Additional Long Term Goals   Additional Long Term Goals Yes      PT LONG TERM GOAL #6   Title Pt. will be compliant with progressed HEP for balance/dizziness.    Target Date 04/08/21      PT LONG  TERM GOAL #7   Title Pt. will complete DGI with goal TBD for safety with community ambulation    Time 6    Period Weeks    Status New    Target Date 04/08/21      PT LONG TERM GOAL #8   Title Pt. will report 75% improvement in vertigo symptoms.    Time 6    Period Weeks    Status New    Target Date 04/08/21                    Plan - 02/25/21 1853     Clinical Impression Statement Marissa Hall is a 80 year old female referred for vertigo.  She reports dizziness/imbalance with head position changes, especially looking up and down.  She also reports symptoms consistent with orthostatic hypotension in the morning, but today blood pressure was stable with sitting to standing, not tested in supine today due to time constraints.  Educated on sitting up and standing up slowly to avoid these symptoms at home.  She also demonstrates dizziness with primarily vertical head position changes, but no nystagmus and no complaints of dizziness with rolling over in bed.  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  She would benefit from skilled physical therapy to decrease vertigo symptoms and decrease risk of falls.    Personal Factors and Comorbidities Age;Fitness;Past/Current Experience;Time since onset of injury/illness/exacerbation;Comorbidity 3+    Comorbidities Osteoporosis, ORIF L ankle fracture 2009, HTN, DM-II, hypothyroidism, DVT, breast cancer, piriformis syndrome, TIA, GERD, anxiety and depression    Examination-Activity Limitations [place];Stairs;Transfers    Examination-Participation Restrictions Cleaning;Community Activity;Laundry;Shop;Volunteer    Stability/Clinical Decision Making Evolving/Moderate complexity    Clinical Decision Making Moderate    Rehab Potential Good    PT Frequency 2x / week    PT Duration 6 weeks    PT Treatment/Interventions ADLs/Self Care Home Management;Cryotherapy;Electrical Stimulation;Iontophoresis  4mg /ml Dexamethasone;Moist Heat;Ultrasound;DME Instruction;Gait training;Stair training;Functional mobility training;Therapeutic activities;Therapeutic exercise;Balance training;Neuromuscular re-education;Patient/family education;Manual techniques;Passive range of motion;Dry needling;Taping;Spinal Manipulations;Joint Manipulations    PT Next Visit Plan initate lumbopelvic mobility and strengthening; propriceptive exercises; manual therapy and modalities PRN.  Review gaze stabilization exercises, perform DGI.    PT Home Exercise Plan Access Code: [email or web address] (8/30)    Consulted and Agree with Plan of Care Patient             Patient will benefit from skilled therapeutic intervention in order to improve the following deficits and impairments:  Decreased activity tolerance, Decreased balance, Decreased knowledge of precautions, Decreased mobility, Decreased range of motion, Decreased safety awareness, Decreased strength, Difficulty walking, Increased fascial restricitons, Increased muscle spasms, Impaired perceived functional ability, Impaired flexibility, Improper body mechanics, Postural dysfunction, Pain, Dizziness  Visit Diagnosis: Dizziness and giddiness  Unsteadiness on feet  History of falling  Other symptoms and signs involving the musculoskeletal system  Acute left-sided low back pain without sciatica  Muscle weakness (generalized)     Problem List Patient Active Problem List   Diagnosis Date Noted   Acquired hypothyroidism 02/23/2021   Type 2 diabetes mellitus with diabetic polyneuropathy, without long-term current use of insulin ([place]) 02/23/2021   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   Diabetes mellitus type 2 without retinopathy ([place]) 09/21/2020   Meibomian gland dysfunction (MGD) of both eyes 09/21/2020   History of COVID-19 06/23/2020   Piriformis syndrome of right side 05/16/2019   Lower extremity pain, right 04/23/2019   DVT (deep venous thrombosis) ([place]) 04/23/2019    Lumbar radiculopathy, right 04/02/2019   Other long term (current) drug therapy 06/14/2017   Anxiety and depression    History of breast cancer 03/02/2017   History of therapeutic radiation 03/02/2017   Breast pain, right 10/03/2016   Type 2 diabetes mellitus ([place]) 08/23/2016   Hypertension associated with diabetes ([place]) 08/23/2016   Hypothyroidism 08/23/2016   Port catheter in place 05/09/2016   Peripheral neuropathy 05/09/2016   Breast cancer of upper-inner quadrant of right female breast ([place]) 03/09/2016   Major depressive disorder, recurrent ([place]) 32/44/0102   Uncomplicated alcohol dependence ([place]) 02/19/2014    Rennie Natter, PT, DPT 02/25/2021, 7:07 PM  [facility] [address]  [place], [place], [address] Phone: [phone]   Fax:  [phone]  Name: Marissa Hall MRN: [id number] Date of Birth: Apr 13, 1941

## 2021-03-03 ENCOUNTER — Ambulatory Visit: Payer: Medicare Other | Admitting: Physical Therapy

## 2021-03-03 ENCOUNTER — Other Ambulatory Visit: Payer: Self-pay | Admitting: Family Medicine

## 2021-03-04 ENCOUNTER — Encounter: Payer: Self-pay | Admitting: Internal Medicine

## 2021-03-09 ENCOUNTER — Other Ambulatory Visit: Payer: Self-pay

## 2021-03-09 ENCOUNTER — Ambulatory Visit: Payer: Medicare Other | Attending: Family Medicine | Admitting: Physical Therapy

## 2021-03-09 ENCOUNTER — Encounter: Payer: Self-pay | Admitting: Physical Therapy

## 2021-03-09 DIAGNOSIS — Z9181 History of falling: Secondary | ICD-10-CM | POA: Insufficient documentation

## 2021-03-09 DIAGNOSIS — R2689 Other abnormalities of gait and mobility: Secondary | ICD-10-CM | POA: Insufficient documentation

## 2021-03-09 DIAGNOSIS — R42 Dizziness and giddiness: Secondary | ICD-10-CM | POA: Insufficient documentation

## 2021-03-09 DIAGNOSIS — R29898 Other symptoms and signs involving the musculoskeletal system: Secondary | ICD-10-CM | POA: Diagnosis not present

## 2021-03-09 DIAGNOSIS — R2681 Unsteadiness on feet: Secondary | ICD-10-CM | POA: Diagnosis not present

## 2021-03-09 DIAGNOSIS — R293 Abnormal posture: Secondary | ICD-10-CM | POA: Insufficient documentation

## 2021-03-09 DIAGNOSIS — M6281 Muscle weakness (generalized): Secondary | ICD-10-CM | POA: Insufficient documentation

## 2021-03-09 DIAGNOSIS — M545 Low back pain, unspecified: Secondary | ICD-10-CM | POA: Insufficient documentation

## 2021-03-09 NOTE — Therapy (Signed)
[facility] [address]  [place], [place], [address] Phone: [phone]   Fax:  [phone]  Physical Therapy Treatment  Patient Details  Name: Niamh Rada MRN: [id number] Date of Birth: July 15, 1940 Referring Provider (PT): Gregor Hams, MD   Encounter Date: 03/09/2021   PT End of Session - 03/09/21 1802     Visit Number 4    Number of Visits 16    Date for PT Re-Evaluation 03/30/21    Authorization Type Medicare & BCBS    PT Start Time 1532    PT Stop Time 6962    PT Time Calculation (min) 42 min    Activity Tolerance Patient tolerated treatment well    Behavior During Therapy [facility] for tasks assessed/performed             Past Medical History:  Diagnosis Date   Acute DVT (deep venous thrombosis) ([place]) 04/23/2019   Right popliteal   Anxiety and depression 1964   oncologist started duloxetine 12/2016   Breast cancer ([place]) 03/14/2016   Clinical stage 2A: (triple neg): Right breast, upper inner quadrant, 03/2016.  Neoadjuvant chemo x 5 cycles,lumpectomy 4 mo later, then RT started 10/2016.  Adjuvant Xeloda [id number].  SWOG research trial pt 04/2017--pt randomized to pembrolizumab immunotherapy.  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  A1c Mar 12, 2018 was 7.0% at Dr. Shirlyn Goltz.    Epidermoid cyst of vulva    Chronic epidermoid cyst of the vulva.  Excision done 02/2019   GERD (gastroesophageal reflux disease) 2013   Hyperlipidemia 1986   Hypertension 2008   2022 ->addition of hctz to lisinopril led to 64ml drop in GFR   Hypothyroidism 1988    Diagnosed in her 67s.  Managed by Endocrinologist   Lumbar radiculopathy 04/2019   Dr. Tamala Julian to get plain films of LB and hip (considering MRI due to her hx of cancer)   Osteoporosis 2015   pt states "osteopenia", but then says that she refused to take the rx med for this condition, so I suspect she had osteoporosis.   Peripheral neuropathy 2017   Patient states diabetic neuropathy in feet prior to starting chemotherapy and then worsened by chemo.    TIA (transient ischemic attack) 03/26/2011   2012: question of (HA + R eye "floaters"). CT in ED neg acute. Not admitted, no f/u testing done.  ?ocular migraine?    Past Surgical History:  Procedure Laterality Date   ABDOMINAL HYSTERECTOMY  1972   APPENDECTOMY  1972   BREAST ENHANCEMENT SURGERY  1982   BREAST IMPLANT REMOVAL Right 09/13/2016   Procedure: REMOVAL RIGHT BREAST IMPLANT;  Surgeon: Irene Limbo, MD;  Location: [facility];  Service: Plastics;  Laterality: Right;   BREAST LUMPECTOMY WITH RADIOACTIVE SEED AND SENTINEL LYMPH NODE BIOPSY Right 09/13/2016   Procedure: RIGHT BREAST LUMPECTOMY WITH RADIOACTIVE SEED X 2 AND SENTINEL LYMPH NODE BIOPSY;  Surgeon: Alphonsa Overall, MD;  Location: [facility];  Service: General;  Laterality: Right;   BREAST SURGERY Right 03/14/2016   Biopsy   CAPSULECTOMY Right 09/13/2016  Procedure: RIGHT CAPSULECTOMY;  Surgeon: Irene Limbo, MD;  Location: [facility];  Service: Plastics;  Laterality: Right;   CATARACT EXTRACTION, BILATERAL Bilateral 08/10/17 right eye, 08/31/17 left eye   MASS EXCISION Left 02/28/2018   Path: benign.  Procedure: EXCISIONLEFT MEDIAL THIGH MASS ERAS PATHWAY;  Surgeon: Erroll Luna, MD;  Location: [facility];  Service: General;  Laterality: Left;   PORTACATH PLACEMENT N/A 03/15/2016   Procedure: INSERTION PORT-A-CATH WITH [place];  Surgeon: Alphonsa Overall, MD;  Location: [facility];  Service: General;  Laterality: N/A;    PORTACATH REMOVAL  07/2017   surgical repair left ankle Left 2009   s/p [place]   Age 80   US CAROTID DOPPLER BILATERAL ([place] [facility])  01/2021   <50% bilat int carotid sten, otherwise normal.    There were no vitals filed for this visit.   Subjective Assessment - 03/09/21 1540     Subjective Patient reports her breathing is doing better, dizziness is better and so is the ringing.    Currently in Pain? No/denies                [facility] PT Assessment - 03/09/21 0001       Balance   Balance Assessed Yes      Standardized Balance Assessment   Standardized Balance Assessment Berg Balance Test;Dynamic Gait Index      Berg Balance Test   Sit to Stand Able to stand without using hands and stabilize independently    Standing Unsupported Able to stand safely 2 minutes    Sitting with Back Unsupported but Feet Supported on Floor or Stool Able to sit safely and securely 2 minutes    Stand to Sit Sits safely with minimal use of hands    Transfers Able to transfer safely, minor use of hands    Standing Unsupported with Eyes Closed Able to stand 10 seconds safely    Standing Unsupported with Feet Together Able to place feet together independently and stand 1 minute safely    From Standing, Reach Forward with Outstretched Arm Can reach forward >12 cm safely (5")    From Standing Position, Pick up Object from Floor Able to pick up shoe safely and easily    From Standing Position, Turn to Look Behind Over each Shoulder Looks behind from both sides and weight shifts well    Turn 360 Degrees Able to turn 360 degrees safely in 4 seconds or less    Standing Unsupported, Alternately Place Feet on Step/Stool Able to stand independently and safely and complete 8 steps in 20 seconds    Standing Unsupported, One Foot in Front Needs help to step but can hold 15 seconds    Standing on One Leg Able to lift leg independently and hold 5-10 seconds    Total Score 51       Dynamic Gait Index   Level Surface Normal    Change in Gait Speed Normal    Gait with Horizontal Head Turns Mild Impairment    Gait with Vertical Head Turns Mild Impairment    Gait and Pivot Turn Normal    Step Over Obstacle Normal    Step Around Obstacles Normal    Steps Mild Impairment   uses rail descending due to vertigo   Total Score 21    DGI comment: no AD, did have to remove shoes for some tasks  Balance Exercises - 03/09/21 0001       Balance Exercises: Standing   Standing Eyes Opened Solid surface;Limitations    Standing Eyes Opened Limitations in corner for safety, 30 seconds, head nods x 10 with eyes focused, head turns x 10 sec with eyes vocues    Standing Eyes Closed Solid surface;Limitations    Standing Eyes Closed Limitations in corner for safety, 30 seconds followed by head turns x 10, head nods x 10    Tandem Stance Eyes open;30 secs;Limitations    Tandem Stance Time both in corner and at counter                PT Education - 03/09/21 1800     Education Details progressed HEP for balance exercises.  Access Code: [email or web address]    Person(s) Educated Patient    Methods Explanation;Demonstration;Handout    Comprehension Verbalized understanding;Returned demonstration              PT Short Term Goals - 02/25/21 1859       PT SHORT TERM GOAL #1   Title Patient will be independent with initial HEP    Status On-going    Target Date 03/02/21      PT SHORT TERM GOAL #2   Title Patient will verbalize/demonstrate good awareness of neutral spine posture and proper body mechanics for daily tasks    Status On-going    Target Date 03/02/21      PT SHORT TERM GOAL #3   Title Decrease L flank pain by >/= 25-50% allowing patient increased ease of bed mobility and transitional movements    Status On-going    Target Date 03/02/21      PT SHORT TERM GOAL #4   Title Complete balance screen and set goal for  improvement    Status On-going   5x STS 22 sec (>14 sec indicates balance dysfunction)   Target Date 03/02/21               PT Long Term Goals - 03/09/21 1809       PT LONG TERM GOAL #1   Title Patient will be independent with ongoing/advanced HEP for self-management at home in order to build upon functional gains in therapy    Status On-going      PT LONG TERM GOAL #2   Title Patient to demonstrate ability to achieve and maintain good spinal alignment/posturing    Status On-going      PT LONG TERM GOAL #3   Title Decrease L flank pain by >/= 50-75% allowing patient increased ease of bed mobility and transitional movements    Status On-going      PT LONG TERM GOAL #4   Title Patient to improve lumbopelvic flexibilty and AROM to [facility] without pain provocation    Status On-going      PT LONG TERM GOAL #5   Title Patient to report ability to perform ADLs, household, and work-related tasks without limitation due to L flank pain, LOM or weakness    Status On-going   03/09/21- reports no pain just fatigued     PT LONG TERM GOAL #6   Title Pt. will be compliant with progressed HEP for balance/dizziness.    Status On-going   03/09/21 - compliant with current   Target Date 04/08/21      PT LONG TERM GOAL #7   Title Pt. will score >22/24 on DGI for safety with community ambulation    Baseline 21/24 on DGI moderate  risk of falls.    Time 6    Period Weeks    Status On-going    Target Date 04/08/21      PT LONG TERM GOAL #8   Title Pt. will report 75% improvement in vertigo symptoms.    Time 6    Period Weeks    Status On-going   03/09/21- reports improvement   Target Date 04/08/21                   Plan - 03/09/21 1803     Clinical Impression Statement Marvis reports that her symptoms have improved overall, with less dizziness, pain, and ringing in the ears.  Today focused on assessing balance, her static balance was relatively good, and scored 51/56 on Berg.  Her  dynamic balance, especially with adding in head turns, was more challenged and she scored 21/24 on the DGI, and had difficulty with tandem stance.  HEP was progressed to add corner balance exercises with head turns and tandem stance for balance.  She was also challenged with standing on compliant surface due to history of R ankle fracture and hardware limiting R ankle ROM and strength.  She would benefit from continued skilled therapy.    Personal Factors and Comorbidities Age;Fitness;Past/Current Experience;Time since onset of injury/illness/exacerbation;Comorbidity 3+    Comorbidities Osteoporosis, ORIF L ankle fracture 2009, HTN, DM-II, hypothyroidism, DVT, breast cancer, piriformis syndrome, TIA, GERD, anxiety and depression    Examination-Activity Limitations [place];Stairs;Transfers    Examination-Participation Restrictions Cleaning;Community Activity;Laundry;Shop;Volunteer    Stability/Clinical Decision Making Evolving/Moderate complexity    Rehab Potential Good    PT Frequency 2x / week    PT Duration 6 weeks    PT Treatment/Interventions ADLs/Self Care Home Management;Cryotherapy;Electrical Stimulation;Iontophoresis 4mg /ml Dexamethasone;Moist Heat;Ultrasound;DME Instruction;Gait training;Stair training;Functional mobility training;Therapeutic activities;Therapeutic exercise;Balance training;Neuromuscular re-education;Patient/family education;Manual techniques;Passive range of motion;Dry needling;Taping;Spinal Manipulations;Joint Manipulations    PT Next Visit Plan initate lumbopelvic mobility and strengthening; propriceptive exercises; manual therapy and modalities PRN.  Dynamic balance activties.    PT Home Exercise Plan Access Code: [email or web address] (8/30)    Consulted and Agree with Plan of Care Patient             Patient will benefit from skilled therapeutic intervention in order to improve the following deficits and impairments:  Decreased activity tolerance, Decreased balance, Decreased  knowledge of precautions, Decreased mobility, Decreased range of motion, Decreased safety awareness, Decreased strength, Difficulty walking, Increased fascial restricitons, Increased muscle spasms, Impaired perceived functional ability, Impaired flexibility, Improper body mechanics, Postural dysfunction, Pain, Dizziness  Visit Diagnosis: Dizziness and giddiness  Unsteadiness on feet  History of falling  Other symptoms and signs involving the musculoskeletal system  Acute left-sided low back pain without sciatica  Muscle weakness (generalized)     Problem List Patient Active Problem List   Diagnosis Date Noted   Acquired hypothyroidism 02/23/2021   Type 2 diabetes mellitus with diabetic polyneuropathy, without long-term current use of insulin ([place]) 02/23/2021   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   Diabetes mellitus type 2 without retinopathy ([place]) 09/21/2020   Meibomian gland dysfunction (MGD) of both eyes 09/21/2020   History of COVID-19 06/23/2020   Piriformis syndrome of right side 05/16/2019   Lower extremity pain, right 04/23/2019   DVT (deep venous thrombosis) ([place]) 04/23/2019   Lumbar radiculopathy, right 04/02/2019   Other long term (current) drug therapy 06/14/2017   Anxiety and depression    History of breast cancer 03/02/2017   History of therapeutic radiation 03/02/2017   Breast pain,  right 10/03/2016   Type 2 diabetes mellitus ([place]) 08/23/2016   Hypertension associated with diabetes ([place]) 08/23/2016   Hypothyroidism 08/23/2016   Port catheter in place 05/09/2016   Peripheral neuropathy 05/09/2016   Breast cancer of upper-inner quadrant of right female breast ([place]) 03/09/2016   Major depressive disorder, recurrent ([place]) 19/41/7408   Uncomplicated alcohol dependence ([place]) 02/19/2014    Rennie Natter, PT, DPT 03/09/2021, 6:15 PM  [facility] [address]  [place], [place], [address] Phone:  [phone]   Fax:  [phone]  Name: Tenelle Andreason MRN: [id number] Date of Birth: 20-Dec-1940

## 2021-03-09 NOTE — Patient Instructions (Signed)
Access Code: [email or web address] URL: [email or web address] Date: 03/09/2021 Prepared by: Glenetta Hew  Exercises Tandem Stance in Corner - 1 x daily - 7 x weekly - 3 sets - 10 reps Standing Balance in Corner - 1 x daily - 7 x weekly - 3 sets - 10 reps Standing Balance in Corner with Eyes Closed - 1 x daily - 7 x weekly - 3 sets - 10 reps

## 2021-03-11 ENCOUNTER — Ambulatory Visit: Payer: Medicare Other

## 2021-03-11 ENCOUNTER — Other Ambulatory Visit: Payer: Self-pay

## 2021-03-11 VITALS — BP 142/80

## 2021-03-11 DIAGNOSIS — Z9181 History of falling: Secondary | ICD-10-CM | POA: Diagnosis not present

## 2021-03-11 DIAGNOSIS — R29898 Other symptoms and signs involving the musculoskeletal system: Secondary | ICD-10-CM

## 2021-03-11 DIAGNOSIS — M6281 Muscle weakness (generalized): Secondary | ICD-10-CM | POA: Diagnosis not present

## 2021-03-11 DIAGNOSIS — R2681 Unsteadiness on feet: Secondary | ICD-10-CM | POA: Diagnosis not present

## 2021-03-11 DIAGNOSIS — R42 Dizziness and giddiness: Secondary | ICD-10-CM | POA: Diagnosis not present

## 2021-03-11 DIAGNOSIS — M545 Low back pain, unspecified: Secondary | ICD-10-CM

## 2021-03-11 NOTE — Therapy (Signed)
[facility] [address]  [place], [place], [address] Phone: [phone]   Fax:  [phone]  Physical Therapy Treatment  Patient Details  Name: Marissa Hall MRN: [id number] Date of Birth: 27-Oct-1940 Referring Provider (PT): Gregor Hams, MD   Encounter Date: 03/11/2021   PT End of Session - 03/11/21 1616     Visit Number 5    Number of Visits 16    Date for PT Re-Evaluation 03/30/21    Authorization Type Medicare & BCBS    PT Start Time 1517    PT Stop Time 1613    PT Time Calculation (min) 39 min    Activity Tolerance Patient tolerated treatment well    Behavior During Therapy [facility] for tasks assessed/performed             Past Medical History:  Diagnosis Date   Acute DVT (deep venous thrombosis) ([place]) 04/23/2019   Right popliteal   Anxiety and depression 1964   oncologist started duloxetine 12/2016   Breast cancer ([place]) 03/14/2016   Clinical stage 2A: (triple neg): Right breast, upper inner quadrant, 03/2016.  Neoadjuvant chemo x 5 cycles,lumpectomy 4 mo later, then RT started 10/2016.  Adjuvant Xeloda [id number].  SWOG research trial pt 04/2017--pt randomized to pembrolizumab immunotherapy.  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  A1c Mar 12, 2018 was 7.0% at Dr. Shirlyn Goltz.    Epidermoid cyst of vulva    Chronic epidermoid cyst of the vulva.  Excision done 02/2019   GERD (gastroesophageal reflux disease) 2013   Hyperlipidemia 1986   Hypertension 2008   2022 ->addition of hctz to lisinopril led to 2ml drop in GFR   Hypothyroidism 1988    Diagnosed in her 2s.  Managed by Endocrinologist   Lumbar radiculopathy 04/2019   Dr. Tamala Julian to get plain films of LB and hip (considering MRI due to her hx of cancer)   Osteoporosis 2015   pt states "osteopenia", but then says that she refused to take the rx med for this condition, so I suspect she had osteoporosis.   Peripheral neuropathy 2017   Patient states diabetic neuropathy in feet prior to starting chemotherapy and then worsened by chemo.    TIA (transient ischemic attack) 03/26/2011   2012: question of (HA + R eye "floaters"). CT in ED neg acute. Not admitted, no f/u testing done.  ?ocular migraine?    Past Surgical History:  Procedure Laterality Date   ABDOMINAL HYSTERECTOMY  1972   APPENDECTOMY  1972   BREAST ENHANCEMENT SURGERY  1982   BREAST IMPLANT REMOVAL Right 09/13/2016   Procedure: REMOVAL RIGHT BREAST IMPLANT;  Surgeon: Irene Limbo, MD;  Location: [facility];  Service: Plastics;  Laterality: Right;   BREAST LUMPECTOMY WITH RADIOACTIVE SEED AND SENTINEL LYMPH NODE BIOPSY Right 09/13/2016   Procedure: RIGHT BREAST LUMPECTOMY WITH RADIOACTIVE SEED X 2 AND SENTINEL LYMPH NODE BIOPSY;  Surgeon: Alphonsa Overall, MD;  Location: [facility];  Service: General;  Laterality: Right;   BREAST SURGERY Right 03/14/2016   Biopsy   CAPSULECTOMY Right 09/13/2016  Procedure: RIGHT CAPSULECTOMY;  Surgeon: Irene Limbo, MD;  Location: [facility];  Service: Plastics;  Laterality: Right;   CATARACT EXTRACTION, BILATERAL Bilateral 08/10/17 right eye, 08/31/17 left eye   MASS EXCISION Left 02/28/2018   Path: benign.  Procedure: EXCISIONLEFT MEDIAL THIGH MASS ERAS PATHWAY;  Surgeon: Erroll Luna, MD;  Location: [facility];  Service: General;  Laterality: Left;   PORTACATH PLACEMENT N/A 03/15/2016   Procedure: INSERTION PORT-A-CATH WITH [place];  Surgeon: Alphonsa Overall, MD;  Location: [facility];  Service: General;  Laterality: N/A;    PORTACATH REMOVAL  07/2017   surgical repair left ankle Left 2009   s/p [place]   Age 45   US CAROTID DOPPLER BILATERAL ([place] [facility])  01/2021   <50% bilat int carotid sten, otherwise normal.    Vitals:   03/11/21 1544  BP: (!) 142/80     Subjective Assessment - 03/11/21 1536     Subjective Patient reports that her dizziness has gotten much better but when raising herself out of bed it comes back.    Pertinent History Vertigo, HTN, T2DM, hypothyroidism    Diagnostic tests 01/13/21 - Lumbar spine & L hip x-rays: negative; Rib x-rays: 1. Probable minimally displaced fractures involving the left sixth and seventh ribs.  2. Negative for a pneumothorax.    Patient Stated Goals to be able to move head and not feel dizzy    Currently in Pain? No/denies                               [facility] Adult PT Treatment/Exercise - 03/11/21 0001       Lumbar Exercises: Stretches   Other Lumbar Stretch Exercise prayer stretch with green pball 10 reps    Other Lumbar Stretch Exercise B trunk rotations seated 10x3"      Lumbar Exercises: Aerobic   Nustep L3x25min      Lumbar Exercises: Seated   Other Seated Lumbar Exercises ball squeezes 10x3"                 Balance Exercises - 03/11/21 0001       Balance Exercises: Standing   Standing Eyes Opened Limitations ball tosses from ariex pad 8x    Standing Eyes Closed Narrow base of support (BOS);Solid surface;3 reps   15 second holds   Standing Eyes Closed Limitations in corner for safety    Tandem Stance 2 reps;30 secs;Eyes open    Tandem Stance Time in corner for safety    Marching Foam/compliant surface;10 reps    Other Standing Exercises fwd reaching while standing on airex pad 2 trials of 30 secs with varying targets    Other Standing Exercises Comments gaze stability exercises vertial and horizontal 10 reps each                  PT Short Term Goals - 02/25/21 1859        PT SHORT TERM GOAL #1   Title Patient will be independent with initial HEP    Status On-going    Target Date 03/02/21      PT SHORT TERM GOAL #2   Title Patient will verbalize/demonstrate good awareness of neutral spine posture and proper body mechanics for daily tasks    Status On-going    Target Date 03/02/21      PT SHORT TERM GOAL #3   Title Decrease  L flank pain by >/= 25-50% allowing patient increased ease of bed mobility and transitional movements    Status On-going    Target Date 03/02/21      PT SHORT TERM GOAL #4   Title Complete balance screen and set goal for improvement    Status On-going   5x STS 22 sec (>14 sec indicates balance dysfunction)   Target Date 03/02/21               PT Long Term Goals - 03/09/21 1809       PT LONG TERM GOAL #1   Title Patient will be independent with ongoing/advanced HEP for self-management at home in order to build upon functional gains in therapy    Status On-going      PT LONG TERM GOAL #2   Title Patient to demonstrate ability to achieve and maintain good spinal alignment/posturing    Status On-going      PT LONG TERM GOAL #3   Title Decrease L flank pain by >/= 50-75% allowing patient increased ease of bed mobility and transitional movements    Status On-going      PT LONG TERM GOAL #4   Title Patient to improve lumbopelvic flexibilty and AROM to [facility] without pain provocation    Status On-going      PT LONG TERM GOAL #5   Title Patient to report ability to perform ADLs, household, and work-related tasks without limitation due to L flank pain, LOM or weakness    Status On-going   03/09/21- reports no pain just fatigued     PT LONG TERM GOAL #6   Title Pt. will be compliant with progressed HEP for balance/dizziness.    Status On-going   03/09/21 - compliant with current   Target Date 04/08/21      PT LONG TERM GOAL #7   Title Pt. will score >22/24 on DGI for safety with community ambulation    Baseline 21/24 on  DGI moderate risk of falls.    Time 6    Period Weeks    Status On-going    Target Date 04/08/21      PT LONG TERM GOAL #8   Title Pt. will report 75% improvement in vertigo symptoms.    Time 6    Period Weeks    Status On-going   03/09/21- reports improvement   Target Date 04/08/21                   Plan - 03/11/21 1616     Clinical Impression Statement Checked BP to start session due to pt request, vitals were WNL as pt stated that her BP is normally slightly elevated. Pt reported less dizziness and no LBP prior to session. Continued with balance exercises and progressed with more dynamic activites like reaching and ball tosses. Most challenge was during tandem stance and did note some dizziness with the gaze stability exercises. Cues for posture during exercises and guidance needed for desired motion. Pt is making good progress ad would continue to benefit from PT to improve function.    Personal Factors and Comorbidities Age;Fitness;Past/Current Experience;Time since onset of injury/illness/exacerbation;Comorbidity 3+    Comorbidities Osteoporosis, ORIF L ankle fracture 2009, HTN, DM-II, hypothyroidism, DVT, breast cancer, piriformis syndrome, TIA, GERD, anxiety and depression    Examination-Activity Limitations [place];Stairs;Transfers    Examination-Participation Restrictions Cleaning;Community Activity;Laundry;Shop;Volunteer    Stability/Clinical Decision Making Evolving/Moderate complexity    Rehab Potential Good    PT Frequency 2x /  week    PT Duration 6 weeks    PT Treatment/Interventions ADLs/Self Care Home Management;Cryotherapy;Electrical Stimulation;Iontophoresis 4mg /ml Dexamethasone;Moist Heat;Ultrasound;DME Instruction;Gait training;Stair training;Functional mobility training;Therapeutic activities;Therapeutic exercise;Balance training;Neuromuscular re-education;Patient/family education;Manual techniques;Passive range of motion;Dry needling;Taping;Spinal  Manipulations;Joint Manipulations    PT Next Visit Plan initate lumbopelvic mobility and strengthening; propriceptive exercises; manual therapy and modalities PRN.  Dynamic balance activties.    PT Home Exercise Plan Access Code: [email or web address] (8/30)    Consulted and Agree with Plan of Care Patient             Patient will benefit from skilled therapeutic intervention in order to improve the following deficits and impairments:  Decreased activity tolerance, Decreased balance, Decreased knowledge of precautions, Decreased mobility, Decreased range of motion, Decreased safety awareness, Decreased strength, Difficulty walking, Increased fascial restricitons, Increased muscle spasms, Impaired perceived functional ability, Impaired flexibility, Improper body mechanics, Postural dysfunction, Pain, Dizziness  Visit Diagnosis: Dizziness and giddiness  Unsteadiness on feet  History of falling  Other symptoms and signs involving the musculoskeletal system  Acute left-sided low back pain without sciatica  Muscle weakness (generalized)     Problem List Patient Active Problem List   Diagnosis Date Noted   Acquired hypothyroidism 02/23/2021   Type 2 diabetes mellitus with diabetic polyneuropathy, without long-term current use of insulin ([place]) 02/23/2021   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   Diabetes mellitus type 2 without retinopathy ([place]) 09/21/2020   Meibomian gland dysfunction (MGD) of both eyes 09/21/2020   History of COVID-19 06/23/2020   Piriformis syndrome of right side 05/16/2019   Lower extremity pain, right 04/23/2019   DVT (deep venous thrombosis) ([place]) 04/23/2019   Lumbar radiculopathy, right 04/02/2019   Other long term (current) drug therapy 06/14/2017   Anxiety and depression    History of breast cancer 03/02/2017   History of therapeutic radiation 03/02/2017   Breast pain, right 10/03/2016   Type 2 diabetes mellitus ([place]) 08/23/2016   Hypertension associated with diabetes  ([place]) 08/23/2016   Hypothyroidism 08/23/2016   Port catheter in place 05/09/2016   Peripheral neuropathy 05/09/2016   Breast cancer of upper-inner quadrant of right female breast ([place]) 03/09/2016   Major depressive disorder, recurrent ([place]) 48/18/5631   Uncomplicated alcohol dependence ([place]) 02/19/2014    Artist Pais, PTA 03/11/2021, 4:36 PM  [facility] [address]  [place], [place], [address] Phone: [phone]   Fax:  [phone]  Name: Vidhi Delellis MRN: [id number] Date of Birth: [birth year]

## 2021-03-15 ENCOUNTER — Ambulatory Visit: Payer: Medicare Other

## 2021-03-22 ENCOUNTER — Other Ambulatory Visit: Payer: Self-pay

## 2021-03-22 ENCOUNTER — Ambulatory Visit: Payer: Medicare Other | Admitting: Physical Therapy

## 2021-03-22 ENCOUNTER — Encounter: Payer: Self-pay | Admitting: Physical Therapy

## 2021-03-22 DIAGNOSIS — M545 Low back pain, unspecified: Secondary | ICD-10-CM | POA: Diagnosis not present

## 2021-03-22 DIAGNOSIS — Z9181 History of falling: Secondary | ICD-10-CM

## 2021-03-22 DIAGNOSIS — M6281 Muscle weakness (generalized): Secondary | ICD-10-CM | POA: Diagnosis not present

## 2021-03-22 DIAGNOSIS — R2681 Unsteadiness on feet: Secondary | ICD-10-CM | POA: Diagnosis not present

## 2021-03-22 DIAGNOSIS — R42 Dizziness and giddiness: Secondary | ICD-10-CM

## 2021-03-22 DIAGNOSIS — R29898 Other symptoms and signs involving the musculoskeletal system: Secondary | ICD-10-CM

## 2021-03-22 NOTE — Patient Instructions (Signed)
Access Code: [email or web address] URL: [email or web address] Date: 03/22/2021 Prepared by: Glenetta Hew  Exercises Seated Cervical Retraction - 3 x daily - 7 x weekly - 1 sets - 5 reps - 3-5 sec hold Seated Shoulder Rolls - 2 x daily - 7 x weekly - 1 sets - 10 reps Seated Scapular Retraction - 3 x daily - 7 x weekly - 1 sets - 10 reps Supine Chin Tuck - 1 x daily - 7 x weekly - 2 sets - 10 reps

## 2021-03-22 NOTE — Therapy (Signed)
[facility] [address]  [place], [place], [address] Phone: [phone]   Fax:  [phone]  Physical Therapy Treatment  Patient Details  Name: Marissa Hall MRN: [id number] Date of Birth: [birth year] Referring Provider (PT): Gregor Hams, MD   Encounter Date: 03/22/2021   PT End of Session - 03/22/21 1757     Visit Number 6    Number of Visits 16    Date for PT Re-Evaluation 03/30/21    Authorization Type Medicare & BCBS    PT Start Time 7858    PT Stop Time 1532    PT Time Calculation (min) 38 min    Activity Tolerance Patient tolerated treatment well    Behavior During Therapy [facility] for tasks assessed/performed             Past Medical History:  Diagnosis Date   Acute DVT (deep venous thrombosis) ([place]) 04/23/2019   Right popliteal   Anxiety and depression 1964   oncologist started duloxetine 12/2016   Breast cancer ([place]) 03/14/2016   Clinical stage 2A: (triple neg): Right breast, upper inner quadrant, 03/2016.  Neoadjuvant chemo x 5 cycles,lumpectomy 4 mo later, then RT started 10/2016.  Adjuvant Xeloda [id number].  SWOG research trial pt 04/2017--pt randomized to pembrolizumab immunotherapy.  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  A1c Mar 12, 2018 was 7.0% at Dr. Shirlyn Goltz.    Epidermoid cyst of vulva    Chronic epidermoid cyst of the vulva.  Excision done 02/2019   GERD (gastroesophageal reflux disease) 2013   Hyperlipidemia 1986   Hypertension 2008   2022 ->addition of hctz to lisinopril led to 77ml drop in GFR   Hypothyroidism 1988    Diagnosed in her 60s.  Managed by Endocrinologist   Lumbar radiculopathy 04/2019   Dr. Tamala Julian to get plain films of LB and hip (considering MRI due to her hx of cancer)   Osteoporosis 2015   pt states "osteopenia", but then says that she refused to take the rx med for this condition, so I suspect she had osteoporosis.   Peripheral neuropathy 2017   Patient states diabetic neuropathy in feet prior to starting chemotherapy and then worsened by chemo.    TIA (transient ischemic attack) 03/26/2011   2012: question of (HA + R eye "floaters"). CT in ED neg acute. Not admitted, no f/u testing done.  ?ocular migraine?    Past Surgical History:  Procedure Laterality Date   ABDOMINAL HYSTERECTOMY  1972   APPENDECTOMY  1972   BREAST ENHANCEMENT SURGERY  1982   BREAST IMPLANT REMOVAL Right 09/13/2016   Procedure: REMOVAL RIGHT BREAST IMPLANT;  Surgeon: Irene Limbo, MD;  Location: [facility];  Service: Plastics;  Laterality: Right;   BREAST LUMPECTOMY WITH RADIOACTIVE SEED AND SENTINEL LYMPH NODE BIOPSY Right 09/13/2016   Procedure: RIGHT BREAST LUMPECTOMY WITH RADIOACTIVE SEED X 2 AND SENTINEL LYMPH NODE BIOPSY;  Surgeon: Alphonsa Overall, MD;  Location: [facility];  Service: General;  Laterality: Right;   BREAST SURGERY Right 03/14/2016   Biopsy   CAPSULECTOMY Right 09/13/2016  Procedure: RIGHT CAPSULECTOMY;  Surgeon: Irene Limbo, MD;  Location: [facility];  Service: Plastics;  Laterality: Right;   CATARACT EXTRACTION, BILATERAL Bilateral 08/10/17 right eye, 08/31/17 left eye   MASS EXCISION Left 02/28/2018   Path: benign.  Procedure: EXCISIONLEFT MEDIAL THIGH MASS ERAS PATHWAY;  Surgeon: Erroll Luna, MD;  Location: [facility];  Service: General;  Laterality: Left;   PORTACATH PLACEMENT N/A 03/15/2016   Procedure: INSERTION PORT-A-CATH WITH [place];  Surgeon: Alphonsa Overall, MD;  Location: [facility];  Service: General;  Laterality: N/A;    PORTACATH REMOVAL  07/2017   surgical repair left ankle Left 2009   s/p [place]   Age 80   US CAROTID DOPPLER BILATERAL ([place] [facility])  01/2021   <50% bilat int carotid sten, otherwise normal.    There were no vitals filed for this visit.   Subjective Assessment - 03/22/21 1456     Subjective Patient reports dizziness still occurs when tilts head back.  No difficulty with rolling over in bed.  Reports compliance with exercises.  Some intermittant L UT pain.  Pain in flank from fall really isn't bothering her anymore.    Pertinent History Vertigo, HTN, T2DM, hypothyroidism    Diagnostic tests 01/13/21 - Lumbar spine & L hip x-rays: negative; Rib x-rays: 1. Probable minimally displaced fractures involving the left sixth and seventh ribs.  2. Negative for a pneumothorax.    Patient Stated Goals to be able to move head and not feel dizzy    Currently in Pain? No/denies   some pain in L UT at night.                              [facility] Adult PT Treatment/Exercise - 03/22/21 0001       Neuro Re-ed    Neuro Re-ed Details  --      Neck Exercises: Seated   Neck Retraction 10 reps;3 secs    Neck Retraction Limitations VC and demo    Shoulder Rolls 10 reps;Backwards    Other Seated Exercise scap squeezes x 10      Neck Exercises: Supine   Neck Retraction 10 reps;3 secs    Neck Retraction Limitations hands on occiput for cueing      Lumbar Exercises: Aerobic   Nustep L4 x 6 min      Manual Therapy   Manual Therapy Joint mobilization;Soft tissue mobilization;Myofascial release    Manual therapy comments to cervical region in supine    Joint Mobilization PA mobs to C4-C6, NAGS into rotation bil    Soft tissue mobilization STM to cevical paraspinals, UT, levator scapulae    Myofascial Release MFR to L UT/levator             Vestibular Treatment/Exercise - 03/22/21 0001       Vestibular Treatment/Exercise   Gaze Exercises X1  Viewing Horizontal;X1 Viewing Vertical      X1 Viewing Horizontal   Foot Position standing, feet together    Reps 20      X1 Viewing Vertical   Foot Position standing, feet together    Reps 20                    PT Education - 03/22/21 1533     Education Details added postural strengthening exercises    Person(s) Educated Patient    Methods Explanation;Demonstration;Verbal cues;Handout  Comprehension Verbalized understanding;Returned demonstration              PT Short Term Goals - 02/25/21 1859       PT SHORT TERM GOAL #1   Title Patient will be independent with initial HEP    Status On-going    Target Date 03/02/21      PT SHORT TERM GOAL #2   Title Patient will verbalize/demonstrate good awareness of neutral spine posture and proper body mechanics for daily tasks    Status On-going    Target Date 03/02/21      PT SHORT TERM GOAL #3   Title Decrease L flank pain by >/= 25-50% allowing patient increased ease of bed mobility and transitional movements    Status On-going    Target Date 03/02/21      PT SHORT TERM GOAL #4   Title Complete balance screen and set goal for improvement    Status On-going   5x STS 22 sec (>14 sec indicates balance dysfunction)   Target Date 03/02/21               PT Long Term Goals - 03/09/21 1809       PT LONG TERM GOAL #1   Title Patient will be independent with ongoing/advanced HEP for self-management at home in order to build upon functional gains in therapy    Status On-going      PT LONG TERM GOAL #2   Title Patient to demonstrate ability to achieve and maintain good spinal alignment/posturing    Status On-going      PT LONG TERM GOAL #3   Title Decrease L flank pain by >/= 50-75% allowing patient increased ease of bed mobility and transitional movements    Status On-going      PT LONG TERM GOAL #4   Title Patient to improve lumbopelvic flexibilty and AROM to [facility] without pain provocation    Status  On-going      PT LONG TERM GOAL #5   Title Patient to report ability to perform ADLs, household, and work-related tasks without limitation due to L flank pain, LOM or weakness    Status On-going   03/09/21- reports no pain just fatigued     PT LONG TERM GOAL #6   Title Pt. will be compliant with progressed HEP for balance/dizziness.    Status On-going   03/09/21 - compliant with current   Target Date 04/08/21      PT LONG TERM GOAL #7   Title Pt. will score >22/24 on DGI for safety with community ambulation    Baseline 21/24 on DGI moderate risk of falls.    Time 6    Period Weeks    Status On-going    Target Date 04/08/21      PT LONG TERM GOAL #8   Title Pt. will report 75% improvement in vertigo symptoms.    Time 6    Period Weeks    Status On-going   03/09/21- reports improvement   Target Date 04/08/21                   Plan - 03/22/21 1758     Clinical Impression Statement Patient is making overall good progress, reporting less dizziness overall and no new episodes of vertigo.  She also reports that flank pain from fall has not been bothering her.  Today reviewed VOR exercises, progressed to standing.  Also progressed HEP to include postural strengthening, as she has a  lot of tightness in capital extensors and forward head posture, that may also aggravate symptoms of dizziness.  Manual therapy to cervical spine and L UT today to decrease pain.  She would benefit from continued skilled therapy.    Personal Factors and Comorbidities Age;Fitness;Past/Current Experience;Time since onset of injury/illness/exacerbation;Comorbidity 3+    Comorbidities Osteoporosis, ORIF L ankle fracture 2009, HTN, DM-II, hypothyroidism, DVT, breast cancer, piriformis syndrome, TIA, GERD, anxiety and depression    Examination-Activity Limitations [place];Stairs;Transfers    Examination-Participation Restrictions Cleaning;Community Activity;Laundry;Shop;Volunteer    Stability/Clinical Decision  Making Evolving/Moderate complexity    Rehab Potential Good    PT Frequency 2x / week    PT Duration 6 weeks    PT Treatment/Interventions ADLs/Self Care Home Management;Cryotherapy;Electrical Stimulation;Iontophoresis 4mg /ml Dexamethasone;Moist Heat;Ultrasound;DME Instruction;Gait training;Stair training;Functional mobility training;Therapeutic activities;Therapeutic exercise;Balance training;Neuromuscular re-education;Patient/family education;Manual techniques;Passive range of motion;Dry needling;Taping;Spinal Manipulations;Joint Manipulations    PT Next Visit Plan initate lumbopelvic mobility and strengthening; propriceptive exercises; manual therapy and modalities PRN.  Dynamic balance activties.    PT Home Exercise Plan Access Code: [email or web address] (8/30)    Consulted and Agree with Plan of Care Patient             Patient will benefit from skilled therapeutic intervention in order to improve the following deficits and impairments:  Decreased activity tolerance, Decreased balance, Decreased knowledge of precautions, Decreased mobility, Decreased range of motion, Decreased safety awareness, Decreased strength, Difficulty walking, Increased fascial restricitons, Increased muscle spasms, Impaired perceived functional ability, Impaired flexibility, Improper body mechanics, Postural dysfunction, Pain, Dizziness  Visit Diagnosis: Dizziness and giddiness  Unsteadiness on feet  History of falling  Other symptoms and signs involving the musculoskeletal system  Acute left-sided low back pain without sciatica  Muscle weakness (generalized)     Problem List Patient Active Problem List   Diagnosis Date Noted   Acquired hypothyroidism 02/23/2021   Type 2 diabetes mellitus with diabetic polyneuropathy, without long-term current use of insulin ([place]) 02/23/2021   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   Diabetes mellitus type 2 without retinopathy ([place]) 09/21/2020   Meibomian gland dysfunction (MGD) of  both eyes 09/21/2020   History of COVID-19 06/23/2020   Piriformis syndrome of right side 05/16/2019   Lower extremity pain, right 04/23/2019   DVT (deep venous thrombosis) ([place]) 04/23/2019   Lumbar radiculopathy, right 04/02/2019   Other long term (current) drug therapy 06/14/2017   Anxiety and depression    History of breast cancer 03/02/2017   History of therapeutic radiation 03/02/2017   Breast pain, right 10/03/2016   Type 2 diabetes mellitus ([place]) 08/23/2016   Hypertension associated with diabetes ([place]) 08/23/2016   Hypothyroidism 08/23/2016   Port catheter in place 05/09/2016   Peripheral neuropathy 05/09/2016   Breast cancer of upper-inner quadrant of right female breast ([place]) 03/09/2016   Major depressive disorder, recurrent ([place]) 88/82/8003   Uncomplicated alcohol dependence ([place]) 02/19/2014    Rennie Natter, PT, DPT 03/22/2021, 6:07 PM  [facility] [address]  [place], [place], [address] Phone: [phone]   Fax:  [phone]  Name: Marissa Hall MRN: [id number] Date of Birth: 06/12/1940

## 2021-03-25 ENCOUNTER — Ambulatory Visit: Payer: Medicare Other | Admitting: Physical Therapy

## 2021-03-25 ENCOUNTER — Other Ambulatory Visit: Payer: Self-pay

## 2021-03-25 ENCOUNTER — Encounter: Payer: Self-pay | Admitting: Physical Therapy

## 2021-03-25 DIAGNOSIS — M545 Low back pain, unspecified: Secondary | ICD-10-CM

## 2021-03-25 DIAGNOSIS — M6281 Muscle weakness (generalized): Secondary | ICD-10-CM

## 2021-03-25 DIAGNOSIS — Z9181 History of falling: Secondary | ICD-10-CM | POA: Diagnosis not present

## 2021-03-25 DIAGNOSIS — R2681 Unsteadiness on feet: Secondary | ICD-10-CM | POA: Diagnosis not present

## 2021-03-25 DIAGNOSIS — R29898 Other symptoms and signs involving the musculoskeletal system: Secondary | ICD-10-CM | POA: Diagnosis not present

## 2021-03-25 DIAGNOSIS — R2689 Other abnormalities of gait and mobility: Secondary | ICD-10-CM

## 2021-03-25 DIAGNOSIS — R42 Dizziness and giddiness: Secondary | ICD-10-CM | POA: Diagnosis not present

## 2021-03-25 DIAGNOSIS — R293 Abnormal posture: Secondary | ICD-10-CM

## 2021-03-25 NOTE — Therapy (Addendum)
PHYSICAL THERAPY DISCHARGE SUMMARY (04/27/2021)  Visits from Start of Care: 7  Current functional level related to goals / functional outcomes: See progress note below.  Patient had met or almost met all goals.    Remaining deficits: See progress note below   Education / Equipment: HEP  Plan: Patient agrees to discharge.  Patient is being discharged due to meeting the stated rehab goals.  Patient was placed on 30 day hold on 03/25/2021 due to progress to transition to HEP and has not returned within that time frame.      Rennie Natter, PT  04/27/2021   [facility] [address]  [place], [place], [address] Phone: [phone]   Fax:  [phone]  Physical Therapy Treatment/Progress Note  Progress Note Reporting Period 02/02/2021 to 03/25/2021  See note below for Objective Data and Assessment of Progress/Goals.     Patient Details  Name: Marissa Hall MRN: [id number] Date of Birth: 14-Mar-1941 Referring Provider (PT): Gregor Hams, MD   Encounter Date: 03/25/2021   PT End of Session - 03/25/21 1407     Visit Number 7    Number of Visits 16    Date for PT Re-Evaluation 03/30/21    Authorization Type Medicare & BCBS    PT Start Time 1401    PT Stop Time 1444    PT Time Calculation (min) 43 min    Activity Tolerance Patient tolerated treatment well    Behavior During Therapy [facility] for tasks assessed/performed             Past Medical History:  Diagnosis Date   Acute DVT (deep venous thrombosis) ([place]) 04/23/2019   Right popliteal   Anxiety and depression 1964   oncologist started duloxetine 12/2016   Breast cancer ([place]) 03/14/2016   Clinical stage 2A: (triple neg): Right breast, upper inner quadrant, 03/2016.  Neoadjuvant chemo x 5 cycles,lumpectomy 4 mo later, then RT started 10/2016.  Adjuvant Xeloda [id number].  SWOG research trial pt 04/2017--pt randomized to pembrolizumab immunotherapy.   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  A1c Mar 12, 2018 was 7.0% at Dr. Shirlyn Goltz.    Epidermoid cyst of vulva    Chronic epidermoid cyst of the vulva.  Excision done 02/2019   GERD (gastroesophageal reflux disease) 2013   Hyperlipidemia 1986   Hypertension 2008   2022 ->addition of hctz to lisinopril led to 37m drop in GFR   Hypothyroidism 1988   Diagnosed in her 45s  Managed by Endocrinologist   Lumbar radiculopathy 04/2019   Dr. STamala Julianto get plain films of LB and hip (considering MRI due to her hx of cancer)   Osteoporosis 2015   pt states "osteopenia", but then says that she refused to take the rx med for this condition, so I suspect she had osteoporosis.   Peripheral neuropathy 2017   Patient states diabetic neuropathy in feet prior to starting chemotherapy and then worsened by chemo.    TIA (transient ischemic attack) 03/26/2011   2012: question of (HA + R eye "floaters"). CT in ED neg acute. Not admitted,  no f/u testing done.  ?ocular migraine?    Past Surgical History:  Procedure Laterality Date   ABDOMINAL HYSTERECTOMY  1972   APPENDECTOMY  1972   BREAST ENHANCEMENT SURGERY  1982   BREAST IMPLANT REMOVAL Right 09/13/2016   Procedure: REMOVAL RIGHT BREAST IMPLANT;  Surgeon: Irene Limbo, MD;  Location: [place] [facility];  Service: Plastics;  Laterality: Right;   BREAST LUMPECTOMY WITH RADIOACTIVE SEED AND SENTINEL LYMPH NODE BIOPSY Right 09/13/2016   Procedure: RIGHT BREAST LUMPECTOMY WITH RADIOACTIVE SEED X 2 AND SENTINEL LYMPH NODE BIOPSY;  Surgeon: Alphonsa Overall, MD;  Location: [place];  Service: General;  Laterality: Right;   BREAST SURGERY Right 03/14/2016   Biopsy   CAPSULECTOMY Right 09/13/2016   Procedure: RIGHT CAPSULECTOMY;  Surgeon: Irene Limbo, MD;  Location: [place];  Service: Plastics;  Laterality: Right;   CATARACT EXTRACTION, BILATERAL Bilateral 08/10/17 right eye, 08/31/17 left eye   MASS EXCISION Left 02/28/2018   Path: benign.  Procedure: EXCISIONLEFT MEDIAL THIGH MASS ERAS PATHWAY;  Surgeon: Erroll Luna, MD;  Location: [place];  Service: General;  Laterality: Left;   PORTACATH PLACEMENT N/A 03/15/2016   Procedure: INSERTION PORT-A-CATH WITH [place];  Surgeon: Alphonsa Overall, MD;  Location: [facility];  Service: General;  Laterality: N/A;   PORTACATH REMOVAL  07/2017   surgical repair left ankle Left 2009   s/p [place]   Age 4   US CAROTID DOPPLER BILATERAL ([place] [facility])  01/2021   <50% bilat int carotid sten, otherwise normal.    There were no vitals filed for this visit.   Subjective Assessment - 03/25/21 1403     Subjective Patient reports not feeling great today, went out last night to eat sushi and drink beer.   Neck and shoulder hurt today, but hasn't had any real pain in her side from the fall.    Pertinent History Vertigo, HTN, T2DM, hypothyroidism    Diagnostic tests 01/13/21 - Lumbar spine & L hip x-rays: negative; Rib x-rays: 1. Probable minimally displaced fractures involving the left sixth and seventh ribs.  2. Negative for a pneumothorax.    Patient Stated Goals to be able to move head and not feel dizzy    Currently in Pain? Yes    Pain Score 3     Pain Location Neck    Pain Orientation Left    Pain Descriptors / Indicators Sore                [facility] PT Assessment - 03/25/21 0001       Assessment   Medical Diagnosis Acute L LBP, L sided rib/flank pain and contusion, L hip pain s/o fall    Referring Provider (PT) Gregor Hams, MD    Onset Date/Surgical  Date 01/03/21    Hand Dominance Right      Dynamic Gait Index   Level Surface Normal    Change in Gait Speed Normal    Gait with Horizontal Head Turns Normal    Gait with Vertical Head Turns Normal    Gait and Pivot Turn Normal    Step Over Obstacle Normal    Step Around Obstacles Normal    Steps Mild Impairment    Total Score 23    DGI comment: no AD  [place] Adult PT Treatment/Exercise - 03/25/21 0001       Neck Exercises: Machines for Strengthening   UBE (Upper Arm Bike) L1 x 6 min (1f3b)      Manual Therapy   Manual Therapy Joint mobilization;Soft tissue mobilization;Myofascial release    Manual therapy comments to cervical region in supine    Joint Mobilization PA mobs to C4-C6, NAGS into rotation bil    Soft tissue mobilization STM to cevical paraspinals, UT, levator scapulae    Myofascial Release MFR to L UT/levator                       PT Short Term Goals - 03/25/21 1409       PT SHORT TERM GOAL #1   Title Patient will be independent with initial HEP    Status Achieved    Target Date 03/02/21      PT SHORT TERM GOAL #2   Title Patient will verbalize/demonstrate good awareness of neutral spine posture and proper body mechanics for daily tasks    Status Achieved    Target Date 03/02/21      PT SHORT TERM GOAL #3   Title Decrease L flank pain by >/= 25-50% allowing patient increased ease of bed mobility and transitional movements    Status Achieved   03/25/21- no pain just weak   Target Date 03/02/21      PT SHORT TERM GOAL #4   Title Complete balance screen and set goal for improvement    Status Achieved   5x STS 22 sec (>14 sec indicates balance dysfunction)   Target Date 03/02/21               PT Long Term Goals - 03/25/21 1412       PT LONG TERM GOAL #1   Title Patient will be independent with ongoing/advanced HEP for self-management at home in order to build upon functional gains in  therapy    Status Achieved   10/20- reports good compliance with HEP     PT LONG TERM GOAL #2   Title Patient to demonstrate ability to achieve and maintain good spinal alignment/posturing    Status Achieved      PT LONG TERM GOAL #3   Title Decrease L flank pain by >/= 50-75% allowing patient increased ease of bed mobility and transitional movements    Status Achieved   03/25/21- no pain in L flank     PT LONG TERM GOAL #4   Title Patient to improve lumbopelvic flexibilty and AROM to [facility] provocation    Status Partially Met   03/25/21- tightness in side but no pain with movement     PT LONG TERM GOAL #5   Title Patient to report ability to perform ADLs, household, and work-related tasks without limitation due to L flank pain, LOM or weakness    Status Achieved   03/09/21- reports no pain just fatigued  03/25/21- no difficulties with ADLs or housework due to pain or weakness.     PT LONG TERM GOAL #6   Title Pt. will be compliant with progressed HEP for balance/dizziness.    Status Partially Met   03/09/21 - compliant with current 03/25/21 - compliant, declined to progress exercises today.     PT LONG TERM GOAL #7   Title Pt. will score >22/24 on DGI for safety with community ambulation    Baseline 21/24 on DGI moderate risk of falls.  Time 6    Period Weeks    Status Achieved   03/25/21- 23/24     PT LONG TERM GOAL #8   Title Pt. will report 75% improvement in vertigo symptoms.    Time 6    Period Weeks    Status Achieved   03/09/21- reports improvement 03/25/21- reports no further episodes of vertigo                  Plan - 03/25/21 1559     Clinical Impression Statement Patient reports significant improvement in both pain and vertigo, only has some "tightness" remaining from fall, and no further episodes of vertigo.  She reports compliance with her HEP and declines to progress today, feeling she has enough exercises to work on.  She demonstates decreased  fall risk, scoring 23/24 on DGI today.  She would like to be placed on 30 day hold, as she would like to stop appt. and travel, since she is feeling much better.  She has met or mostly met all of her goals.  focused remainder of session on manual therapy to cervical spine and L UT as this is still bothering her.   Reported no pain at end of session.    Personal Factors and Comorbidities Age;Fitness;Past/Current Experience;Time since onset of injury/illness/exacerbation;Comorbidity 3+    Comorbidities Osteoporosis, ORIF L ankle fracture 2009, HTN, DM-II, hypothyroidism, DVT, breast cancer, piriformis syndrome, TIA, GERD, anxiety and depression    Examination-Activity Limitations [place];Stairs;Transfers    Examination-Participation Restrictions Cleaning;Community Activity;Laundry;Shop;Volunteer    Stability/Clinical Decision Making Evolving/Moderate complexity    Rehab Potential Good    PT Frequency 2x / week    PT Duration 6 weeks    PT Treatment/Interventions ADLs/Self Care Home Management;Cryotherapy;Electrical Stimulation;Iontophoresis 33m/ml Dexamethasone;Moist Heat;Ultrasound;DME Instruction;Gait training;Stair training;Functional mobility training;Therapeutic activities;Therapeutic exercise;Balance training;Neuromuscular re-education;Patient/family education;Manual techniques;Passive range of motion;Dry needling;Taping;Spinal Manipulations;Joint Manipulations    PT Next Visit Plan 30 day hold    PT Home Exercise Plan Access Code: [email or web address] (8/30)    Consulted and Agree with Plan of Care Patient             Patient will benefit from skilled therapeutic intervention in order to improve the following deficits and impairments:  Decreased activity tolerance, Decreased balance, Decreased knowledge of precautions, Decreased mobility, Decreased range of motion, Decreased safety awareness, Decreased strength, Difficulty walking, Increased fascial restricitons, Increased muscle spasms, Impaired perceived  functional ability, Impaired flexibility, Improper body mechanics, Postural dysfunction, Pain, Dizziness  Visit Diagnosis: Dizziness and giddiness  Unsteadiness on feet  History of falling  Other symptoms and signs involving the musculoskeletal system  Acute left-sided low back pain without sciatica  Muscle weakness (generalized)  Other abnormalities of gait and mobility  Abnormal posture     Problem List Patient Active Problem List   Diagnosis Date Noted   Acquired hypothyroidism 02/23/2021   Type 2 diabetes mellitus with diabetic polyneuropathy, without long-term current use of insulin ([place] 02/23/2021   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   Diabetes mellitus type 2 without retinopathy ([place] 09/21/2020   Meibomian gland dysfunction (MGD) of both eyes 09/21/2020   History of COVID-19 06/23/2020   Piriformis syndrome of right side 05/16/2019   Lower extremity pain, right 04/23/2019   DVT (deep venous thrombosis) ([place] 04/23/2019   Lumbar radiculopathy, right 04/02/2019   Other long term (current) drug therapy 06/14/2017   Anxiety and depression    History of breast cancer 03/02/2017   History of therapeutic radiation 03/02/2017   Breast pain, right 10/03/2016  Type 2 diabetes mellitus ([place]) 08/23/2016   Hypertension associated with diabetes ([place]) 08/23/2016   Hypothyroidism 08/23/2016   Port catheter in place 05/09/2016   Peripheral neuropathy 05/09/2016   Breast cancer of upper-inner quadrant of right female breast ([place]) 03/09/2016   Major depressive disorder, recurrent ([place]) 51/07/5850   Uncomplicated alcohol dependence ([place]) 02/19/2014    Rennie Natter, PT, DPT 03/25/2021, 4:09 PM  [facility] [address]  [place], [place], [address] Phone: [phone]   Fax:  [phone]  Name: Marissa Hall MRN: [id number] Date of Birth: [birth year]

## 2021-04-06 ENCOUNTER — Other Ambulatory Visit: Payer: Self-pay | Admitting: Family Medicine

## 2021-04-08 ENCOUNTER — Other Ambulatory Visit: Payer: Self-pay | Admitting: *Deleted

## 2021-04-08 DIAGNOSIS — C50211 Malignant neoplasm of upper-inner quadrant of right female breast: Secondary | ICD-10-CM

## 2021-04-19 NOTE — Progress Notes (Signed)
Patient Care Team: Tammi Sou, MD as PCP - General (Family Medicine) Alphonsa Overall, MD as Consulting Physician (General Surgery) Nicholas Lose, MD as Consulting Physician (Hematology and Oncology) Kyung Rudd, MD as Consulting Physician (Radiation Oncology) Irene Limbo, MD as Consulting Physician (Plastic Surgery) Gerome Apley, MD as Consulting Physician (Endocrinology) Gardenia Phlegm, NP as Nurse Practitioner (Hematology and Oncology) Princess Bruins, MD as Consulting Physician (Obstetrics and Gynecology)  DIAGNOSIS:    ICD-10-CM   1. Malignant neoplasm of upper-inner quadrant of right breast in female, estrogen receptor negative ([place])  C50.211    Z17.1       SUMMARY OF ONCOLOGIC HISTORY: Oncology History  Breast cancer of upper-inner quadrant of right female breast ([place])  02/29/2016 Initial Diagnosis   Right breast palpable mass (with silicone implants 8144), 3.5 cm on MRI, additional 3 cm anterior linear enhancement? 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 Ki-67 60%, T2 N0 stage 2A clinical stage   03/14/2016 Procedure   Right breast biopsy upper inner quadrant: IDC grade 3   03/28/2016 -  Neo-Adjuvant Chemotherapy   Neoadjuvant chemotherapy with dose dense Adriamycin and Cytoxan followed by Abraxane weekly 5 ( patient is diabetic and cannot take steroids)   07/15/2016 Breast MRI   Right breast: Spiculated mass 1.5 cm significantly smaller compared to prior,NME previously seen is not noted, no abnormal lymph nodes   09/13/2016 Surgery   Removal of the silicone implant due to intracapsular rupture and capsulectomy (Dr.Thimappa)   09/13/2016 Surgery   Right lumpectomy: IDC grade 3, 2 foci, 2 cm and 1.1 cm, 0/3 lymph nodes negative margins negative, ER 0%, PR 0%, HER-2 negative ratio 1.02, Ki-67 60%, RCB-II; ypT2ypN0 Stage 2A    10/20/2016 - 12/06/2016 Radiation Therapy   Adjuvant radiation therapy   12/30/2016 - 04/05/2017 Chemotherapy    Xeloda 1000 mg by mouth twice a day adjuvant therapy x 4 cycles    05/03/2017 - 05/24/2017 Chemotherapy   SWOG S 1418 Pembrolizumab on clinical trial stopped after 2 doses by patient preference (not due to toxicities .)     CHIEF COMPLIANT: Follow-up of right breast cancer and DVT on Xarelto   INTERVAL HISTORY: Marissa Hall is a 80 y.o. with above-mentioned history of right breast cancer treated with neoadjuvant chemotherapy, lumpectomy, radiation, and is currently on surveillance. 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 She presents to the clinic today for follow-up.  She continues to have chemo induced peripheral neuropathy with some days its worse than the others she rates it as 5 out of 10.  Denies any pain or discomfort in the breast.  She is due for mammogram anytime.  She is also tolerating Xarelto fairly well  ALLERGIES:  is allergic to other, prednisone, and penicillins.  MEDICATIONS:  Current Outpatient Medications  Medication Sig Dispense Refill   amLODipine (NORVASC) 10 MG tablet Take 1 tablet (10 mg total) by mouth daily. 30 tablet 1   aspirin 81 MG EC tablet Take 1 tablet (81 mg total) by mouth daily. Swallow whole. 30 tablet 12   atorvastatin (LIPITOR) 80 MG tablet Take 1 tablet (80 mg total) by mouth every evening. 90 tablet 3   cholecalciferol (VITAMIN D3) 25 MCG (1000 UNIT) tablet Take 1,000 Units by mouth daily.     diclofenac Sodium (VOLTAREN) 1 % GEL Apply 2 g topically 4 (four) times daily. 150 g 0   DULoxetine (CYMBALTA) 30 MG capsule TAKE ONE CAPSULE BY MOUTH DAILY 30 capsule  5   estradiol (ESTRACE VAGINAL) 0.1 MG/GM vaginal cream Insert 1 gram vaginally twice weekly 42.5 g 0   ibuprofen (ADVIL) 600 MG tablet Take 1 tablet (600 mg total) by mouth every 6 (six) hours as needed. 30 tablet 0   levothyroxine (SYNTHROID) 112 MCG tablet Take 1 tablet (112 mcg total) by mouth daily before breakfast. 90 tablet 3   lisinopril (ZESTRIL) 40 MG tablet Take 1  tablet (40 mg total) by mouth daily. 14 tablet 0   metFORMIN (GLUCOPHAGE-XR) 500 MG 24 hr tablet Take 1 tablet (500 mg total) by mouth in the morning and at bedtime. 180 tablet 3   Propylene Glycol (SYSTANE BALANCE OP) Place 1 drop into both eyes daily as needed (for dry eyes).     tiZANidine (ZANAFLEX) 2 MG tablet Take 1-2 tablets (2-4 mg total) by mouth every 8 (eight) hours as needed for muscle spasms. Take mostly at bedtime 60 tablet 0   traZODone (DESYREL) 50 MG tablet Take 1 tablet (50 mg total) by mouth at bedtime. 90 tablet 3   No current facility-administered medications for this visit.    PHYSICAL EXAMINATION: ECOG PERFORMANCE STATUS: 1 - Symptomatic but completely ambulatory  Vitals:   04/20/21 1026  BP: (!) 167/73  Pulse: 66  Resp: 18  Temp: 97.9 F (36.6 C)  SpO2: 100%   Filed Weights   04/20/21 1026  Weight: 165 lb (74.8 kg)      LABORATORY DATA:  I have reviewed the data as listed CMP Latest Ref Rng & Units 02/18/2021 01/22/2021 11/03/2020  Glucose 70 - 99 mg/dL 184(H) 111(H) 155(H)  BUN 6 - 23 mg/dL '19 16 17  ' Creatinine 0.40 - 1.20 mg/dL 1.11 0.80 0.83  Sodium 135 - 145 mEq/L 133(L) 138 139  Potassium 3.5 - 5.1 mEq/L 4.4 4.9 4.7  Chloride 96 - 112 mEq/L 99 101 101  CO2 19 - 32 mEq/L '25 27 27  ' Calcium 8.4 - 10.5 mg/dL 10.3 9.8 10.2  Total Protein 6.5 - 8.1 g/dL - - 7.5  Total Bilirubin 0.3 - 1.2 mg/dL - - 1.4(H)  Alkaline Phos 38 - 126 U/L - - 65  AST 15 - 41 U/L - - 23  ALT 0 - 44 U/L - - 34    Lab Results  Component Value Date   WBC 3.6 (L) 04/20/2021   HGB 12.6 04/20/2021   HCT 37.9 04/20/2021   MCV 98.4 04/20/2021   PLT 163 04/20/2021   NEUTROABS 2.0 04/20/2021    ASSESSMENT & PLAN:  Breast cancer of upper-inner quadrant of right female breast ([place]) 02/29/2016: Right breast palpable mass (with silicone implants 1610), 3.5 cm on MRI, additional 3 cm anterior linear enhancement (biopsy 03/14/2016 IDC grade 3); grade 3 IDC triple negative Ki-67  60%.    T2 N0 stage 2A clinical stage   Treatment summary: 1. Neoadjuvant chemotherapy with dose dense Adriamycin and Cytoxan 4 followed by Abraxane weekly 5  (stopped early due to neuropathy) 2. 09/13/2016: Right lumpectomy: IDC grade 3, 2 foci, 2 cm and 1.1 cm, 0/3 lymph nodes negative margins negative, ER 0%, PR 0%, HER-2 negative ratio 1.02, Ki-67 60%, RCB-II; ypT2ypN0 Stage 2A (with plastic surgery removing the ruptured implant) 3. Followed by radiation therapy completed 12/06/2016 4. Adjuvant Xeloda 1000 mg by mouth twice a day 2 weeks on one week off 12/30/2016- Oct 2018 5. SWOG 1418 clinical trial Pembrolizumab cycle 1 given 05/03/2017 (patient decided to discontinue clinical trial) ----------------------------------------------------------------------------------------------------------------------------------------- Surveillance: 1.  Breast exam  04/20/2021: Benign, no palpable lumps or nodules of concern. 2. mamm[facility] 04/09/[facility]: No evidence of malignancy.  Breast density category B We will need to obtain another [facility] at [facility].  Chronic fatigue Chemo-induced peripheral neuropathy: Patient rates it as 5 out of 10 On the patient is diabetic she did not have peripheral neuropathy prior to starting chemotherapy so therefore the neuropathy is all chemo related. We will evaluate her for participation in clinical trial for neuropathy I recommended participation in the phase 2 ACCRU [place] 2102 study of N-Palmitoylrthanolamide vs placebo for the treatment of chemo induced peripheral neuropathy.    Diabetes: Monitoring her sugar intake No clinical or radiological findings of breast cancer recurrence. Return to clinic based on clinical trial participation for the neuropathy study.      No orders of the defined types were placed in this encounter.  The patient has a good understanding of the overall plan. she agrees with it. she will call with any problems that may develop  before the next visit here.  Total time spent: 20 mins including face to face time and time spent for planning, charting and coordination of care  Rulon Eisenmenger, MD, MPH 04/20/2021  I, Thana Ates, am acting as scribe for Dr. Nicholas Lose.  I have reviewed the above documentation for accuracy and completeness, and I agree with the above.

## 2021-04-20 ENCOUNTER — Other Ambulatory Visit: Payer: Self-pay

## 2021-04-20 ENCOUNTER — Inpatient Hospital Stay: Payer: Medicare Other

## 2021-04-20 ENCOUNTER — Encounter: Payer: Self-pay | Admitting: *Deleted

## 2021-04-20 ENCOUNTER — Inpatient Hospital Stay: Payer: Medicare Other | Attending: Hematology and Oncology | Admitting: Hematology and Oncology

## 2021-04-20 DIAGNOSIS — Z171 Estrogen receptor negative status [ER-]: Secondary | ICD-10-CM

## 2021-04-20 DIAGNOSIS — C50211 Malignant neoplasm of upper-inner quadrant of right female breast: Secondary | ICD-10-CM

## 2021-04-20 DIAGNOSIS — Z006 Encounter for examination for normal comparison and control in clinical research program: Secondary | ICD-10-CM | POA: Diagnosis not present

## 2021-04-20 LAB — CBC WITH DIFFERENTIAL (CANCER CENTER ONLY)
Abs Immature Granulocytes: 0.01 10*3/uL (ref 0.00–0.07)
Basophils Absolute: 0 10*3/uL (ref 0.0–0.1)
Basophils Relative: 1 %
Eosinophils Absolute: 0.1 10*3/uL (ref 0.0–0.5)
Eosinophils Relative: 3 %
HCT: 37.9 % (ref 36.0–46.0)
Hemoglobin: 12.6 g/dL (ref 12.0–15.0)
Immature Granulocytes: 0 %
Lymphocytes Relative: 32 %
Lymphs Abs: 1.2 10*3/uL (ref 0.7–4.0)
MCH: 32.7 pg (ref 26.0–34.0)
MCHC: 33.2 g/dL (ref 30.0–36.0)
MCV: 98.4 fL (ref 80.0–100.0)
Monocytes Absolute: 0.4 10*3/uL (ref 0.1–1.0)
Monocytes Relative: 10 %
Neutro Abs: 2 10*3/uL (ref 1.7–7.7)
Neutrophils Relative %: 54 %
Platelet Count: 163 10*3/uL (ref 150–400)
RBC: 3.85 MIL/uL — ABNORMAL LOW (ref 3.87–5.11)
RDW: 12.7 % (ref 11.5–15.5)
WBC Count: 3.6 10*3/uL — ABNORMAL LOW (ref 4.0–10.5)
nRBC: 0 % (ref 0.0–0.2)

## 2021-04-20 LAB — CMP (CANCER CENTER ONLY)
ALT: 27 U/L (ref 0–44)
AST: 19 U/L (ref 15–41)
Albumin: 4 g/dL (ref 3.5–5.0)
Alkaline Phosphatase: 63 U/L (ref 38–126)
Anion gap: 8 (ref 5–15)
BUN: 14 mg/dL (ref 8–23)
CO2: 25 mmol/L (ref 22–32)
Calcium: 9.3 mg/dL (ref 8.9–10.3)
Chloride: 105 mmol/L (ref 98–111)
Creatinine: 0.8 mg/dL (ref 0.44–1.00)
GFR, Estimated: >60 mL/min (ref >60)
Glucose, Bld: 173 mg/dL — ABNORMAL HIGH (ref 70–99)
Potassium: 4.9 mmol/L (ref 3.5–5.1)
Sodium: 138 mmol/L (ref 135–145)
Total Bilirubin: 1.1 mg/dL (ref 0.3–1.2)
Total Protein: 7 g/dL (ref 6.5–8.1)

## 2021-04-20 NOTE — Research (Signed)
Trial:  ACCRU-[place]-2102 - TREATMENT OF ESTABLISHED CHEMOTHERAPY-INDUCED NEUROPATHY WITH N-PALMITOYLETHANOLAMIDE, A CANNABIMIMETIC NUTRACEUTICAL: A RANDOMIZED DOUBLE-BLIND PHASE II PILOT TRIAL  Patient Marissa Hall was identified by Dr. Lindi Adie as a potential candidate for the above listed study.  This Clinical Research Nurse met with Emogene Muratalla, [id number], on 04/20/21 in a manner and location that ensures patient privacy to discuss participation in the above listed research study.  Patient is Unaccompanied.  A copy of the informed consent document with embedded HIPAA language was provided to the patient.  Patient reads, speaks, and understands [place].   Briefly reviewed purpose and design of this study with patient and she voiced interest. Patient reported her neuropathy symptoms bother her a 8 on scale of 1 to 10 in the past week. Reviewed other eligibility criteria with patient and it was determined she is not eligible for this study due to taking Duloxetine. Patient does not plan on stopping Duloxetine and it is not advised that she stop it to go on this study.  Patient may try to take PEA on her own off study to see if it helps and Dr. Lindi Adie notified.  Thanked patient for discussing this with research nurse today.  Foye Spurling, BSN, Therapist, sports, [employer] Clinical Research Nurse 04/20/2021

## 2021-04-20 NOTE — Progress Notes (Signed)
Per MD request RN successfully faxed Mammogram order to [facility].

## 2021-04-20 NOTE — Research (Signed)
48 Months Follow Up Visit for [name] Clinical Trial:  Patient into clinic by herself today to see Dr. Lindi Adie for 57 month study visit.   PROs; Patient forgot her reading glasses today and was not able to see well enough to read the questionnaires herself. The questions were read aloud to patient and answers recorded by research nurse. Completed in clinic exam room in private.  Labs; Completed per protocol and reviewed by Dr. Lindi Adie. Concomitant medications reviewed with patient; She has stopped taking Amlodipine because it made her feel very tired. She states she will discuss with her PCP. She has also stopped taking some other PRN medications which were prescribed for her broken rib pain s/p a fall. Medication list updated.  Patient denies any new medications to add to the list.   H&P, PS, Toxicity and Recurrence assessment;  Completed by Dr. Lindi Adie. See MD encounter note from today.  Thanked patient very much for her time today and ongoing participation in this study.  Her next study visit is due 10/29/2021 (+/- 4 weeks) and will be scheduled.  Asked patient to call research nurse if any questions or problems prior to next study visit.  She verbalized understanding.   Foye Spurling, BSN, RN Clinical Research Nurse 04/20/2021

## 2021-04-20 NOTE — Assessment & Plan Note (Signed)
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  T2 N0 stage 2A clinical stage  Treatment summary: 1. Neoadjuvant chemotherapy with dose dense Adriamycin and Cytoxan 4 followed by Abraxane weekly 5(stopped early due to neuropathy) 2. 09/13/2016: Right lumpectomy: IDC grade 3, 2 foci, 2 cm and 1.1 cm, 0/3 lymph nodes negative margins negative, ER 0%, PR 0%, HER-2 negative ratio 1.02, Ki-67 60%, RCB-II; ypT2ypN0 Stage 2A (with plastic surgery removing the ruptured implant) 3. Followed by radiation therapy completed 12/06/2016 4.Adjuvant Xeloda 1000 mg by mouth twice a day 2 weeks on one week off 12/30/2016-Oct 2018 5.SWOG 1418 clinical trial Pembrolizumabcycle 1 given 05/03/2017(patient decided to discontinue clinical trial) ----------------------------------------------------------------------------------------------------------------------------------------- Surveillance: 1.Breast exam11/15/2022: Benign, no palpable lumps or nodules of concern. 2.mammogram11/4/2021at [facility]: No evidence of malignancy. Breast density categoryB  Chronic fatigue Chemo-induced peripheral neuropathy:Symptoms are mild.  Diabetes: Patient is trying to adjust her diet and cut down her sugar intake. No clinical or radiological findings of breast cancer recurrence. Return to clinic in 1 year for follow-up

## 2021-04-21 ENCOUNTER — Other Ambulatory Visit: Payer: Self-pay | Admitting: Family Medicine

## 2021-04-21 ENCOUNTER — Telehealth: Payer: Self-pay | Admitting: Hematology and Oncology

## 2021-04-21 NOTE — Telephone Encounter (Signed)
Scheduled per sch msg. Called and left msg. Mailed printout  

## 2021-04-27 ENCOUNTER — Other Ambulatory Visit: Payer: Self-pay | Admitting: Family Medicine

## 2021-04-29 DIAGNOSIS — E785 Hyperlipidemia, unspecified: Secondary | ICD-10-CM | POA: Diagnosis not present

## 2021-04-29 DIAGNOSIS — S0990XA Unspecified injury of head, initial encounter: Secondary | ICD-10-CM | POA: Diagnosis not present

## 2021-04-29 DIAGNOSIS — R55 Syncope and collapse: Secondary | ICD-10-CM | POA: Diagnosis not present

## 2021-04-29 DIAGNOSIS — I10 Essential (primary) hypertension: Secondary | ICD-10-CM | POA: Diagnosis not present

## 2021-04-29 DIAGNOSIS — R079 Chest pain, unspecified: Secondary | ICD-10-CM | POA: Diagnosis not present

## 2021-04-29 DIAGNOSIS — S2241XA Multiple fractures of ribs, right side, initial encounter for closed fracture: Secondary | ICD-10-CM | POA: Diagnosis not present

## 2021-04-29 DIAGNOSIS — S3991XA Unspecified injury of abdomen, initial encounter: Secondary | ICD-10-CM | POA: Diagnosis not present

## 2021-04-29 DIAGNOSIS — E119 Type 2 diabetes mellitus without complications: Secondary | ICD-10-CM | POA: Diagnosis not present

## 2021-04-29 DIAGNOSIS — R52 Pain, unspecified: Secondary | ICD-10-CM | POA: Diagnosis not present

## 2021-04-30 DIAGNOSIS — S2241XA Multiple fractures of ribs, right side, initial encounter for closed fracture: Secondary | ICD-10-CM | POA: Insufficient documentation

## 2021-04-30 DIAGNOSIS — R55 Syncope and collapse: Secondary | ICD-10-CM | POA: Diagnosis not present

## 2021-04-30 DIAGNOSIS — I517 Cardiomegaly: Secondary | ICD-10-CM | POA: Diagnosis not present

## 2021-05-01 DIAGNOSIS — E119 Type 2 diabetes mellitus without complications: Secondary | ICD-10-CM | POA: Diagnosis not present

## 2021-05-01 DIAGNOSIS — M79604 Pain in right leg: Secondary | ICD-10-CM | POA: Diagnosis not present

## 2021-05-01 DIAGNOSIS — I10 Essential (primary) hypertension: Secondary | ICD-10-CM | POA: Diagnosis not present

## 2021-05-01 DIAGNOSIS — I639 Cerebral infarction, unspecified: Secondary | ICD-10-CM | POA: Diagnosis not present

## 2021-05-01 DIAGNOSIS — I728 Aneurysm of other specified arteries: Secondary | ICD-10-CM | POA: Diagnosis not present

## 2021-05-01 DIAGNOSIS — E785 Hyperlipidemia, unspecified: Secondary | ICD-10-CM | POA: Diagnosis not present

## 2021-05-02 DIAGNOSIS — R55 Syncope and collapse: Secondary | ICD-10-CM | POA: Diagnosis not present

## 2021-05-03 ENCOUNTER — Telehealth: Payer: Self-pay | Admitting: Family Medicine

## 2021-05-03 ENCOUNTER — Other Ambulatory Visit: Payer: Self-pay | Admitting: Family Medicine

## 2021-05-03 ENCOUNTER — Ambulatory Visit (INDEPENDENT_AMBULATORY_CARE_PROVIDER_SITE_OTHER): Payer: Medicare Other

## 2021-05-03 ENCOUNTER — Ambulatory Visit (INDEPENDENT_AMBULATORY_CARE_PROVIDER_SITE_OTHER): Payer: Medicare Other | Admitting: Family Medicine

## 2021-05-03 ENCOUNTER — Other Ambulatory Visit: Payer: Self-pay

## 2021-05-03 VITALS — BP 182/104 | HR 75 | Ht 68.5 in | Wt 170.4 lb

## 2021-05-03 DIAGNOSIS — R55 Syncope and collapse: Secondary | ICD-10-CM

## 2021-05-03 DIAGNOSIS — S2241XA Multiple fractures of ribs, right side, initial encounter for closed fracture: Secondary | ICD-10-CM | POA: Diagnosis not present

## 2021-05-03 DIAGNOSIS — S2242XD Multiple fractures of ribs, left side, subsequent encounter for fracture with routine healing: Secondary | ICD-10-CM | POA: Diagnosis not present

## 2021-05-03 DIAGNOSIS — W19XXXA Unspecified fall, initial encounter: Secondary | ICD-10-CM

## 2021-05-03 DIAGNOSIS — R0781 Pleurodynia: Secondary | ICD-10-CM | POA: Diagnosis not present

## 2021-05-03 NOTE — Telephone Encounter (Signed)
Radiology called. Marissa Hall has a pneumothorax and a small pleural effusion.  I called Viki back.  She is breathing well.  Plan on rechecking 2 weeks as scheduled.  If worsening go to the emergency room.

## 2021-05-03 NOTE — Patient Instructions (Addendum)
Thank you for coming in today.   Recheck in 2 weeks.   Tylenol 2 extra strength every 6 hours as needed.   Ibuprofen up to 3 every 6 hours as needed.   Home health PT.   Muscle relaxer at bedtime as needed.   Follow up with McGowen, Adrian Blackwater, MD

## 2021-05-03 NOTE — Progress Notes (Signed)
I, [name], LAT, ATC, am serving as scribe for Dr. Lynne Leader.  Marissa Hall is a 80 y.o. female who presents to [place] at [facility] today for f/u of broken ribs that occurred as a result of a fall on 04/28/21 while the pt was in [place], [place].  Pt was seen at [facility] ED on 04/29/21 and was diagnosed w/ a rib fx per XR.   Today, pt reports she just returned from [place] yesterday. Pt locates pain to the right-side of her rib cage w/ spasm along the R side of her back.  She had a fall due to a syncopal event.  The underlying cause of syncope is unknown at this time but thought to be possible orthostasis.  She currently has a 30-day monitor.  She has not yet scheduled a follow-up appointment with her PCP.  During the work-up she was found to have an age-indeterminate CVA.  Aggravating factors: any movements, breathing Treatments tried: Tylenol, IBU  Diagnostic imaging: R hip CT, head MRI/MRA/CT - 04/29/21   Pertinent review of systems: No fevers or chills.  Does note increased thirst.  She notes her blood sugar this morning fasting was 187.   Relevant historical information: Diabetes.  History of alcohol dependency. Hypertension.  Exam:  BP (!) 182/104   Pulse 75   Ht 5' 8.5" (1.74 m)   Wt 170 lb 6.4 oz (77.3 kg)   SpO2 96%   BMI 25.53 kg/m  General: Elderly appearing woman, well nourished, and in no acute distress.  Pain appearing using a walker to ambulate.  MSK: L-spine nontender to midline.  Tender palpation right lateral ribs inferior.    Lab and Radiology Results  X-ray images bilateral ribs obtained today personally and independently interpreted Inferior lateral rib fracture present right side. Await formal radiology review   CLINICAL INDICATION/HISTORY:  Abdominal trauma, blunt (Ped 0-17y).  -Additional: Pain after fall last night   COMPARISON: None   TECHNIQUE: Axial CT imaging of the abdomen and pelvis was performed  with intravenous contrast. Multiplanar reformats were generated. One or more dose reduction techniques were used on this CT: automated exposure control, adjustment of the mAs and/or kVp according to patient size, and iterative reconstruction techniques.  The specific techniques used on this CT exam have been documented in the patient's electronic medical record. Digital Imaging and Communications in Medicine (DICOM) format image data are available to nonaffiliated external healthcare facilities or entities on a secure, media free, reciprocally searchable basis with patient authorization for at least a 60-monthperiod after this study.   _______________   FINDINGS:   LOWER CHEST: Partially imaged mitral annular/left AV groove calcifications, aortic annular calcifications, and left breast implant. Atherosclerosis including the coronary arteries.   LIVER: Diffuse hepatic steatosis, otherwise normal appearing liver.   BILIARY: The gallbladder appears normal. No biliary dilation.   SPLEEN: Normal.   PANCREAS: Normal.   ADRENALS: Normal.   KIDNEYS/URETERS: Normal.   BLADDER: Normal.   GASTROINTESTINAL TRACT:    > Limited evaluation of the stomach, grossly normal.    > No bowel dilation or evidence of obstruction.    > There are a few colonic diverticula without evidence of acute inflammation.   VASCULATURE: Severe burden of atherosclerotic plaque, especially around the main abdominal ostia.   LYMPH NODES: No enlarged lymph nodes.   PELVIC ORGANS: Prior hysterectomy.   OTHER: No free intraperitoneal air. No significant ascites.   BONES: Age indeterminant right posterior 11th  and 12th rib fractures appear acute. Subacute/remote left rib fractures. Multilevel degenerative changes most pronounced in the lumbar spine.   _______________  Recent Data from [place] to BASIC METABOLIC PANEL Component 73/54/30 04/29/21  Potassium 4.4 3.9  Sodium 137 137  Chloride 102 100   Glucose 188 High  234 High   Calcium 8.9 9.8  BUN 17 21  Creatinine 0.7 Low  0.9  CO2 26 26  eGFR >60.0  >60.0   Anion Gap 9.0  11.0     Assessment and Plan: 80 y.o. female with rib fracture due to a fall.  This is similar to an injury she was seen for earlier this year.  Plan for conservative management strategies including rib binder Tylenol limited NSAIDs muscle relaxer at bedtime heating pad.  She also already has home health physical therapy involved.  Plan to check back with me in 2 weeks to make sure that she is improving.  Await radiology over read of the x-ray obtained today.  However on a more larger issue she needs a follow-up appointment with her PCP to reassess the syncopal event.  She may needed readjustment of her blood pressure medication.  Certainly she will need the follow-up from her 30-day event monitor.  Additionally her diabetes may need to be adjusted.  She has increased thirst and it sounds like she may have some glucosuria which may be driving her increased thirst.  Check back with me in 2 weeks.  Follow-up her rib fracture and make sure that some of these other medical issues are currently being addressed.  Her son is helping her currently.  We will fill out FMLA paperwork for him.   PDMP not reviewed this encounter. No orders of the defined types were placed in this encounter.  No orders of the defined types were placed in this encounter.    Discussed warning signs or symptoms. Please see discharge instructions. Patient expresses understanding.   The above documentation has been reviewed and is accurate and complete Lynne Leader, M.D.  Total encounter time 40 minutes including face-to-face time with the patient and, reviewing past medical record, and charting on the date of service.   Extensive discussion and extensive medical review.

## 2021-05-04 NOTE — Progress Notes (Signed)
As we spoke on the phone yesterday x-ray shows a rib fracture and a small amount of air leaked out around the lung called a pneumothorax.  If you worsen go to the emergency room.  Otherwise recheck as scheduled.

## 2021-05-05 ENCOUNTER — Telehealth: Payer: Self-pay | Admitting: Family Medicine

## 2021-05-05 NOTE — Telephone Encounter (Signed)
Lawrence Marseilles [facility], PT (Other) [phone]   Requesting OT/PT/Skilled Nursing/Social Work/Speech Therapy:  Frequency: 2x week for 2 weeks, then 1x week for 1 week  Please return call for Verbal Order.

## 2021-05-05 NOTE — Telephone Encounter (Signed)
Yes okay 

## 2021-05-05 NOTE — Telephone Encounter (Signed)
LM for Vicente Males regarding verbal orders

## 2021-05-05 NOTE — Telephone Encounter (Signed)
Okay for orders? 

## 2021-05-05 NOTE — Telephone Encounter (Signed)
Spoke with Vicente Males and verbal orders given

## 2021-05-07 ENCOUNTER — Other Ambulatory Visit: Payer: Self-pay

## 2021-05-07 ENCOUNTER — Telehealth (INDEPENDENT_AMBULATORY_CARE_PROVIDER_SITE_OTHER): Payer: Medicare Other | Admitting: Family Medicine

## 2021-05-07 ENCOUNTER — Encounter: Payer: Self-pay | Admitting: Family Medicine

## 2021-05-07 DIAGNOSIS — F1010 Alcohol abuse, uncomplicated: Secondary | ICD-10-CM

## 2021-05-07 DIAGNOSIS — Z8673 Personal history of transient ischemic attack (TIA), and cerebral infarction without residual deficits: Secondary | ICD-10-CM | POA: Diagnosis not present

## 2021-05-07 DIAGNOSIS — S2231XD Fracture of one rib, right side, subsequent encounter for fracture with routine healing: Secondary | ICD-10-CM

## 2021-05-07 DIAGNOSIS — R55 Syncope and collapse: Secondary | ICD-10-CM

## 2021-05-07 DIAGNOSIS — I10 Essential (primary) hypertension: Secondary | ICD-10-CM

## 2021-05-07 MED ORDER — LISINOPRIL 40 MG PO TABS: 40.0000 mg | ORAL_TABLET | Freq: Every day | ORAL | 3 refills | Status: DC

## 2021-05-07 NOTE — Progress Notes (Signed)
Virtual Visit via Video Note  I connected with Marissa Hall on 05/07/21 at  8:00 AM EST by a video enabled telemedicine application and verified that I am speaking with the correct person using two identifiers.  Location patient: home, [place] Location provider:work or home office Persons participating in the virtual visit: patient, provider  I discussed the limitations of evaluation and management by telemedicine and the availability of in person appointments. The patient expressed understanding and agreed to proceed.   HPI: 80 y/o female being seen today for hosp f/u. Patient is a 80 y.o. female who presents for follow-up of recent syncope fall in which she sustained R 9th rib fx--- no prodromal symptoms.  This occurred on 04/28/2021 while at [facility].  Admitted November 24 to November 27 at a hospital at [facility], [place].  She did have some orthostatic drop in blood pressure upon evaluation in the hospital that corrected with IV hydration.  Echocardiogram was normal.  EKG normal, telemetry normal.  She had been drinking alcohol some. Head imaging showed a's small age-indeterminate stroke, but no acute abnormalities.  A 30-day event monitor was given to patient.  CBC, CMET, troponins, and urine all normal.  She saw Dr. Lynne Leader 3 days ago for follow-up of her rib fracture and an x-ray showed 1/9 rib fracture as well as a small right apical pneumothorax and small right pleural effusion.  Minimal right basilar subsegmental atelectasis also noted.  Currently patient says she feels fine other than some intermittent pain in the right infraclavicular area.  She is alternating Tylenol and Advil.  Most pain that she is feeling lately is right piriformis syndrome pain that she has had in the past. No shortness of breath, no dyspnea, no exertional chest pain, no dizziness, no presyncope or syncope.  No headache. She admits she does think the wine she had been drinking the night she passed out did have  something to do with the event. Last visit I added HCTZ to her blood pressure regimen but she says this caused her heart to feel like it was pounding.  She has still been taking lisinopril.  She has not been monitoring blood pressure at home, says she still has to get a blood pressure cuff. Most importantly, she has not been able to get her cardiac monitor set up.  She apparently was given a monitor but it has not been activated yet.  ROS: See pertinent positives and negatives per HPI.  Past Medical History:  Diagnosis Date   Acute DVT (deep venous thrombosis) ([place]) 04/23/2019   Right popliteal   Anxiety and depression 1964   oncologist started duloxetine 12/2016   Breast cancer ([place]) 03/14/2016   Clinical stage 2A: (triple neg): Right breast, upper inner quadrant, 03/2016.  Neoadjuvant chemo x 5 cycles,lumpectomy 4 mo later, then RT started 10/2016.  Adjuvant Xeloda [id number].  SWOG research trial pt 04/2017--pt randomized to pembrolizumab immunotherapy.  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   Cataracts, bilateral 07/2017   Chemotherapy-induced neuropathy ([place]) 07/04/2016   feet; responding well to cymbalta   COVID-19 virus infection 05/27/2020   Depression 1964   Patient states since age 36   Diabetes mellitus with complication ([place]) 0488   managed by endocrinology.  A1c Mar 12, 2018 was 7.0% at Dr. Shirlyn Goltz.    Epidermoid cyst of vulva    Chronic epidermoid cyst of the vulva.  Excision  done 02/2019   GERD (gastroesophageal reflux disease) 2013   Hyperlipidemia 1986   Hypertension 2008   2022 ->addition of hctz to lisinopril led to 82ml drop in GFR   Hypothyroidism 1988   Diagnosed in her 6s.  Managed by Endocrinologist   Lumbar radiculopathy 04/2019   Dr. Tamala Julian to get plain films of LB and hip (considering MRI due to her hx of cancer)   Osteoporosis 2015   pt states "osteopenia", but then says  that she refused to take the rx med for this condition, so I suspect she had osteoporosis.   Peripheral neuropathy 2017   Patient states diabetic neuropathy in feet prior to starting chemotherapy and then worsened by chemo.    TIA (transient ischemic attack) 03/26/2011   2012: question of (HA + R eye "floaters"). CT in ED neg acute. Not admitted, no f/u testing done.  ?ocular migraine?    Past Surgical History:  Procedure Laterality Date   ABDOMINAL HYSTERECTOMY  1972   APPENDECTOMY  1972   BREAST ENHANCEMENT SURGERY  1982   BREAST IMPLANT REMOVAL Right 09/13/2016   Procedure: REMOVAL RIGHT BREAST IMPLANT;  Surgeon: Irene Limbo, MD;  Location: [facility];  Service: Plastics;  Laterality: Right;   BREAST LUMPECTOMY WITH RADIOACTIVE SEED AND SENTINEL LYMPH NODE BIOPSY Right 09/13/2016   Procedure: RIGHT BREAST LUMPECTOMY WITH RADIOACTIVE SEED X 2 AND SENTINEL LYMPH NODE BIOPSY;  Surgeon: Alphonsa Overall, MD;  Location: [facility];  Service: General;  Laterality: Right;   BREAST SURGERY Right 03/14/2016   Biopsy   CAPSULECTOMY Right 09/13/2016   Procedure: RIGHT CAPSULECTOMY;  Surgeon: Irene Limbo, MD;  Location: [facility];  Service: Plastics;  Laterality: Right;   CATARACT EXTRACTION, BILATERAL Bilateral 08/10/17 right eye, 08/31/17 left eye   MASS EXCISION Left 02/28/2018   Path: benign.  Procedure: EXCISIONLEFT MEDIAL THIGH MASS ERAS PATHWAY;  Surgeon: Erroll Luna, MD;  Location: [facility];  Service: General;  Laterality: Left;   PORTACATH PLACEMENT N/A 03/15/2016   Procedure: INSERTION PORT-A-CATH WITH [place];  Surgeon: Alphonsa Overall, MD;  Location: [facility];  Service: General;  Laterality: N/A;   PORTACATH REMOVAL  07/2017   surgical repair left ankle Left 2009   s/p [place]   Age 81   US CAROTID DOPPLER BILATERAL ([place] [facility]X)  01/2021   <50% bilat int carotid sten, otherwise normal.      Current Outpatient Medications:    aspirin 81 MG EC tablet, Take 1 tablet (81 mg total) by mouth daily. Swallow whole., Disp: 30 tablet, Rfl: 12   atorvastatin (LIPITOR) 80 MG tablet, Take 1 tablet (80 mg total) by mouth every evening., Disp: 90 tablet, Rfl: 3   cholecalciferol (VITAMIN D3) 25 MCG (1000 UNIT) tablet, Take 1,000 Units by mouth daily., Disp: , Rfl:    diclofenac Sodium (VOLTAREN) 1 % GEL, Apply 2 g topically 4 (four) times daily. (Patient taking differently: Apply 2 g topically as needed.), Disp: 150 g, Rfl: 0   DULoxetine (CYMBALTA) 30 MG capsule, TAKE ONE CAPSULE BY MOUTH DAILY, Disp: 30 capsule, Rfl: 5   levothyroxine (SYNTHROID) 112 MCG tablet, Take 1 tablet (112 mcg total) by mouth daily before breakfast., Disp: 90 tablet, Rfl: 3   lidocaine (LIDODERM) 5 %, Place 1 patch onto the skin daily. Remove & Discard patch within 12 hours or as directed by MD, Disp: , Rfl:    lisinopril (ZESTRIL) 40 MG  tablet, Take 1 tablet (40 mg total) by mouth daily. OFFICE VISIT NEEDED FOR FURTHER REFILLS, Disp: 14 tablet, Rfl: 0   metFORMIN (GLUCOPHAGE-XR) 500 MG 24 hr tablet, Take 1 tablet (500 mg total) by mouth in the morning and at bedtime. (Patient taking differently: Take 1,000 mg by mouth daily before supper.), Disp: 180 tablet, Rfl: 3   Propylene Glycol (SYSTANE BALANCE OP), Place 1 drop into both eyes daily as needed (for dry eyes)., Disp: , Rfl:    traZODone (DESYREL) 50 MG tablet, Take 1 tablet (50 mg total) by mouth at bedtime. (Patient not taking: Reported on 05/07/2021), Disp: 90 tablet, Rfl: 3  EXAM:  VITALS per patient if applicable:  Vitals with BMI 05/03/2021 04/20/2021 03/11/2021  Height 5' 8.5" 5' 8.5" -  Weight 170 lbs 6 oz 165 lbs -  BMI 17.49 44.96 -  Systolic 759 163 846  Diastolic 659 73 80  Pulse 75 66 -    GENERAL: alert, oriented, appears well and in no acute distress  HEENT: atraumatic, conjunttiva clear, no obvious abnormalities on inspection of external  nose and ears  NECK: normal movements of the head and neck  LUNGS: on inspection no signs of respiratory distress, breathing rate appears normal, no obvious gross SOB, gasping or wheezing  CV: no obvious cyanosis  MS: moves all visible extremities without noticeable abnormality  PSYCH/NEURO: pleasant and cooperative, no obvious depression or anxiety, speech and thought processing grossly intact  LAB:  none today    Chemistry      Component Value Date/Time   NA 138 04/20/2021 1008   NA 136 05/01/2017 0914   K 4.9 04/20/2021 1008   K 4.5 05/01/2017 0914   CL 105 04/20/2021 1008   CO2 25 04/20/2021 1008   CO2 24 05/01/2017 0914   BUN 14 04/20/2021 1008   BUN 14.9 05/01/2017 0914   CREATININE 0.80 04/20/2021 1008   CREATININE 0.8 05/01/2017 0914   GLU 181 09/20/2019 0000      Component Value Date/Time   CALCIUM 9.3 04/20/2021 1008   CALCIUM 9.6 05/01/2017 0914   ALKPHOS 63 04/20/2021 1008   ALKPHOS 75 05/01/2017 0914   AST 19 04/20/2021 1008   AST 51 (H) 05/01/2017 0914   ALT 27 04/20/2021 1008   ALT 62 (H) 05/01/2017 0914   BILITOT 1.1 04/20/2021 1008   BILITOT 1.30 (H) 05/01/2017 0914     Lab Results  Component Value Date   WBC 3.6 (L) 04/20/2021   HGB 12.6 04/20/2021   HCT 37.9 04/20/2021   MCV 98.4 04/20/2021   PLT 163 04/20/2021   Lab Results  Component Value Date   HGBA1C 7.2 (A) 02/23/2021   Lab Results  Component Value Date   TSH 4.54 02/23/2021    ASSESSMENT AND PLAN:  Discussed the following assessment and plan:  #1 syncope with injury.  No prodromal symptoms.  She was intoxicated when this happened and does admit she thinks this had something to do with it.  Her rib fracture is not giving her much pain.  Small pneumothorax on recent x-ray.  Patient asymptomatic from a breathing standpoint. Pain is controlled at this time with Advil and Tylenol. Working on getting her 30-day event monitor activated.  She is going to call a contact number she has  about this but if she cannot get anywhere then she will call our office back today and we will see what we can do to get this going. In the meantime she is  not to drive for at least the next 2 weeks.  She has follow-up with Dr. Georgina Snell on December 12. She says she has not been drinking alcohol since that event.  2.  Right ninth rib fracture.  Minimal pain.  See #1 above.  3.  Hypertension, historically not ideally controlled.  Recent HCTZ addition not tolerated.  Continue lisinopril 40 mg/day.  Encouraged patient to get home blood pressure cuff for monitoring.  Electrolytes and creatinine normal in the hospitalization recently.   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 She is on aspirin.  No acute findings on head imaging. Carotid Dopplers in August this year showed less than 50% internal carotid stenosis bilaterally. Event monitor as per #1.  I discussed the assessment and treatment plan with the patient. The patient was provided an opportunity to ask questions and all were answered. The patient agreed with the plan and demonstrated an understanding of the instructions.   F/u: 1 mo  Signed:  Crissie Sickles, MD           05/07/2021

## 2021-05-14 ENCOUNTER — Encounter: Payer: Self-pay | Admitting: Family Medicine

## 2021-05-14 ENCOUNTER — Telehealth: Payer: Self-pay | Admitting: Family Medicine

## 2021-05-14 ENCOUNTER — Other Ambulatory Visit: Payer: Self-pay

## 2021-05-14 ENCOUNTER — Ambulatory Visit (INDEPENDENT_AMBULATORY_CARE_PROVIDER_SITE_OTHER): Payer: Medicare Other | Admitting: Family Medicine

## 2021-05-14 ENCOUNTER — Ambulatory Visit (INDEPENDENT_AMBULATORY_CARE_PROVIDER_SITE_OTHER): Payer: Medicare Other

## 2021-05-14 ENCOUNTER — Other Ambulatory Visit: Payer: Self-pay | Admitting: Family Medicine

## 2021-05-14 VITALS — BP 128/80 | HR 76 | Ht 68.0 in | Wt 161.0 lb

## 2021-05-14 DIAGNOSIS — S270XXD Traumatic pneumothorax, subsequent encounter: Secondary | ICD-10-CM | POA: Diagnosis not present

## 2021-05-14 DIAGNOSIS — R55 Syncope and collapse: Secondary | ICD-10-CM

## 2021-05-14 DIAGNOSIS — S2241XD Multiple fractures of ribs, right side, subsequent encounter for fracture with routine healing: Secondary | ICD-10-CM

## 2021-05-14 DIAGNOSIS — I700 Atherosclerosis of aorta: Secondary | ICD-10-CM | POA: Diagnosis not present

## 2021-05-14 NOTE — Progress Notes (Signed)
I, Vilma Meckel, am serving as a scribe for Dr. Lynne Leader This visit occurred during the SARS-CoV-2 public health emergency.  Safety protocols were in place, including screening questions prior to the visit, additional usage of staff PPE, and extensive cleaning of exam room while observing appropriate contact time as indicated for disinfecting solutions.   Marissa Hall is a 80 y.o. female who presents to [place] [facility] today for fall f/u. Is doing well since last visit. Has taken little ibuprofen and lidocaine has been helping. Asking for prescription lidocaine patch. Pain has decreased significantly. Belt helps with rib pain, feels it most when coughing.  She does not have any trouble breathing.  During the visit 2 weeks ago her chest x-ray with ribs did show a small pneumothorax and pleural effusion.  Additionally as part of her syncope work-up in the hospital she was given a 30-day event monitor which has never worked correctly.  She is currently in the process of working with her primary care provider to either get it sorted out or get a new one.  She does not have a primary cardiologist here in [place].  Pertinent review of systems: No fevers or chills  Relevant historical information: Breast cancer history   Exam:  BP 128/80   Pulse 76   Ht 5\' 8"  (1.727 m)   Wt 161 lb (73 kg)   SpO2 97%   BMI 24.48 kg/m  General: Well Developed, well nourished, and in no acute distress.   MSK: Right lateral ribs tender palpation. Lungs clear to auscultation bilaterally   Lab and Radiology Results  2 view chest x-ray images obtained today personally and independently interpreted Concern atelectasis right lower lung field.  No large pneumothorax Await formal radiology review     Assessment and Plan: 80 y.o. female with rib fractures improving with conservative management.  Continue conservative management strategies including rib belt Tylenol and  therapy exercises.  Recheck in a month.  Lungs: Pneumothorax has resolved or certainly has not worsened.  Patient does appear to have atelectasis on x-ray although radiology overread is still pending.  Deep inspiration should help.  Syncope and cardiology evaluation.  Fundamentally we are struggling with logistics with her 30-day event monitor.  The monitor either is not working correctly or she is struggling significantly with the interface between the cell phone device and the 30-day event monitor.  Additionally the ordering provider for this is back at the hospital in [place] where she was admitted.  I am not optimistic that this 30-day event monitor will be successful.  I think she is going to have more success with a 7 or 14-day more simple event monitor type cardiac device ordered locally here in [place] (Long Term Monitor).  Additionally I think she probably should have a cardiologist's referral here in [place].  I will communicate this with her primary care provider.  If he is unable to place these orders I can be a backup plan.   PDMP not reviewed this encounter. Orders Placed This Encounter  Procedures   DG Chest 2 View    Standing Status:   Future    Number of Occurrences:   1    Standing Expiration Date:   05/14/2022    Order Specific Question:   Reason for Exam (SYMPTOM  OR DIAGNOSIS REQUIRED)    Answer:   rib fracture    Order Specific Question:   Preferred imaging location?    Answer:   [facility]   No orders of the defined types were placed in this encounter.    Discussed warning signs or symptoms. Please see discharge instructions. Patient expresses understanding.   The above documentation has been reviewed and is accurate and complete Lynne Leader, M.D.

## 2021-05-14 NOTE — Telephone Encounter (Signed)
Pt advised of recommendations.  

## 2021-05-14 NOTE — Patient Instructions (Signed)
Xrays today 1 month follow up Communicate with Primary Care about cardio referral and monitor

## 2021-05-14 NOTE — Telephone Encounter (Signed)
Please call patient and tell her Dr. Georgina Snell communicated with me about her heart monitor difficulties. I have ordered a different heart monitor through our cardiology group.  I have also referred her to cardiology.  I specified on these orders that they try to be done at the [facility] cardiology division in [place] since that is where she lives.

## 2021-05-14 NOTE — Progress Notes (Signed)
Orders only entry.

## 2021-05-17 ENCOUNTER — Telehealth: Payer: Self-pay | Admitting: Cardiology

## 2021-05-17 ENCOUNTER — Ambulatory Visit: Payer: Medicare Other | Admitting: Family Medicine

## 2021-05-17 ENCOUNTER — Telehealth: Payer: Self-pay

## 2021-05-17 DIAGNOSIS — R55 Syncope and collapse: Secondary | ICD-10-CM

## 2021-05-17 NOTE — Telephone Encounter (Signed)
FYI

## 2021-05-17 NOTE — Telephone Encounter (Signed)
Called [facility] and a nurse will review and provide f/u call.

## 2021-05-17 NOTE — Telephone Encounter (Signed)
Patient was not able to get into cardiologist until 06/11/21.  Just wanted to let Dr. Anitra Lauth know in case he thought she should try to get in another office sooner. I told patient with holidays coming up next week, that may be the soonest anyone else will.  Please advise [phone]

## 2021-05-17 NOTE — Progress Notes (Signed)
No pnuemothorax is seen on chest xray. This is good news.

## 2021-05-17 NOTE — Telephone Encounter (Signed)
Patient has NP appt on 1/6 with Dr. Bettina Gavia.  However, Dr. Anitra Lauth also ordered a monitor.  Britt from Dr. Idelle Leech office wanted to know if patient needs to what until her appt on 1/6 or can she get the monitor place before that date.

## 2021-05-17 NOTE — Telephone Encounter (Signed)
Great!

## 2021-05-17 NOTE — Telephone Encounter (Signed)
Pt will come in 05/18/21 for monitor to be placed.

## 2021-05-17 NOTE — Telephone Encounter (Signed)
FYI Truddie Hidden, RN reached out via secure chat and notified they will be placing monitor on pt tomorrow.

## 2021-05-17 NOTE — Telephone Encounter (Signed)
OK. Can you check and see if she will still be able to get the heart monitor I ordered or does she have to wait until she sees cardiology?

## 2021-05-18 ENCOUNTER — Ambulatory Visit (INDEPENDENT_AMBULATORY_CARE_PROVIDER_SITE_OTHER): Payer: Medicare Other

## 2021-05-18 ENCOUNTER — Other Ambulatory Visit: Payer: Self-pay

## 2021-05-18 DIAGNOSIS — R55 Syncope and collapse: Secondary | ICD-10-CM | POA: Diagnosis not present

## 2021-05-19 ENCOUNTER — Telehealth: Payer: Self-pay

## 2021-05-19 NOTE — Telephone Encounter (Signed)
Home health orders received 05/19/21 for [facility] health initiation orders: Yes.  Home health re-certification orders: No. Patient last seen by ordering physician for this condition: 05/07/21. Must be less than 90 days for re-certification and less than 30 days prior for initiation. Visit must have been for the condition the orders are being placed.  Patient meets criteria for Physician to sign orders: Yes.        Current med list has been attached: Yes        Orders placed on physicians desk for signature: 05/19/21 (date) If patient does not meet criteria for orders to be signed: pt was called to schedule appt. Appt is scheduled for N/A.   Emilee Hero

## 2021-05-19 NOTE — Telephone Encounter (Signed)
Signed and put in box to go up front. Signed:  Crissie Sickles, MD           05/19/2021

## 2021-05-19 NOTE — Telephone Encounter (Signed)
Orders faxed back

## 2021-06-03 DIAGNOSIS — Z1231 Encounter for screening mammogram for malignant neoplasm of breast: Secondary | ICD-10-CM | POA: Diagnosis not present

## 2021-06-04 DIAGNOSIS — R55 Syncope and collapse: Secondary | ICD-10-CM | POA: Diagnosis not present

## 2021-06-08 ENCOUNTER — Encounter: Payer: Self-pay | Admitting: Obstetrics & Gynecology

## 2021-06-10 DIAGNOSIS — R55 Syncope and collapse: Secondary | ICD-10-CM | POA: Insufficient documentation

## 2021-06-10 NOTE — Progress Notes (Signed)
Cardiology Office Note:    Date:  06/11/2021   ID:  Marissa Hall, DOB [birth year], MRN [id number]  PCP:  Tammi Sou, MD  Cardiologist:  Shirlee More, MD   Referring MD: Tammi Sou, MD  ASSESSMENT:    1. Syncope and collapse   2. Primary hypertension   3. APC (atrial premature contractions)   4. RBBB    PLAN:    In order of problems listed above:  She has had 1 true episode of syncope no obvious precipitant other than alcohol intake and no significant bradycardia or sustained arrhythmia on the monitor.  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  Concerns regarding medicines are outlined above.  Orders Placed This Encounter  Procedures   EKG 12-Lead   No orders of the defined types were placed in this encounter.    Chief Complaint  Patient presents with   Loss of Consciousness    Recent hospitalization in [place] with syncope fall and MRI with new stroke.    History of Present Illness:    Marissa Hall is a 81 y.o. female with a history of diabetes and hypertension and breast cancer with mastectomy chemotherapy radiation and also chemotherapy-induced peripheral neuropathy who is being seen today for the evaluation of syncope at the request of McGowen, Adrian Blackwater, MD.  She was  admitted to [facility] 04/29/2021 with an episode of syncope  and imaging MRI showed an stroke on evaluation.  It is noted that she had an orthostatic drop in blood pressure on evaluation in the hospital that improved with hydration.  She was seen in September after having a fall with rib fractures.  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  Her EKG 04/29/2021 showed incomplete right bundle branch block and repolarization changes sinus rhythm  A 14-day event monitor reported 05/18/2021 she had brief runs of atrial tachycardia the longest 17 complexes rate of 103 bpm ventricular ectopy was rare and there were no bradycardic events.  There were no episodes of atrial fibrillation or flutter.  Carotid duplex reported 01/26/2021 showed less than 50% stenosis internal carotid artery bilaterally.  Interestingly she is a neighbor of mine on back [employer] in [place] She has background history of heart disease with Takotsubo syndrome in 2001 2011 in [place] and both episodes have normal heart catheterization. She has neuropathy and  an unsteady gait. She has had 1 episode of syncope. This was associated with several glasses of wine there was no seizure activity and no recurrence She does not trend her blood pressures at home No chest pain shortness of breath orthopnea edema. She is taking a supplement PEA for neuropathy clinical trial Past Medical History:  Diagnosis Date   Acute DVT (deep venous thrombosis)  ([place]) 04/23/2019   Right popliteal   Anxiety and depression 1964   oncologist started duloxetine 12/2016   Breast cancer ([place]) 03/14/2016   Clinical stage 2A: (triple neg): Right breast, upper inner quadrant, 03/2016.  Neoadjuvant chemo x 5 cycles,lumpectomy 4 mo later, then RT started 10/2016.  Adjuvant Xeloda [id number].  SWOG research trial pt 04/2017--pt randomized to pembrolizumab immunotherapy.  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  A1c Mar 12, 2018 was 7.0% at Dr. Shirlyn Goltz.    Epidermoid cyst of vulva    Chronic epidermoid cyst of the vulva.  Excision done 02/2019   GERD (gastroesophageal reflux disease) 2013   Hyperlipidemia 1986   Hypertension 2008   2022 ->addition of hctz to lisinopril led to 62ml drop in GFR   Hypothyroidism 1988   Diagnosed in her 52s.  Managed by Endocrinologist   Lumbar radiculopathy 04/2019   Dr. Tamala Julian to get plain films of LB and hip (considering MRI due to her hx of cancer)   Osteoporosis 2015   pt states "osteopenia", but then says that she refused to take the rx med for this condition, so I suspect she had osteoporosis.   Peripheral neuropathy 2017   Patient states diabetic neuropathy in feet prior to starting chemotherapy and then worsened by chemo.    Syncope and collapse    05/2021 while in [place].  No prodrome.  Monitor ordered and cardiology referral ordered 05/14/21   TIA (transient ischemic attack) 03/26/2011   2012: question of (HA + R eye "floaters"). CT in ED neg acute. Not admitted, no f/u testing done.  ?ocular migraine?    Past Surgical History:  Procedure Laterality Date    ABDOMINAL HYSTERECTOMY  1972   APPENDECTOMY  1972   BREAST ENHANCEMENT SURGERY  1982   BREAST IMPLANT REMOVAL Right 09/13/2016   Procedure: REMOVAL RIGHT BREAST IMPLANT;  Surgeon: Irene Limbo, MD;  Location: [facility];  Service: Plastics;  Laterality: Right;   BREAST LUMPECTOMY WITH RADIOACTIVE SEED AND SENTINEL LYMPH NODE BIOPSY Right 09/13/2016   Procedure: RIGHT BREAST LUMPECTOMY WITH RADIOACTIVE SEED X 2 AND SENTINEL LYMPH NODE BIOPSY;  Surgeon: Alphonsa Overall, MD;  Location: [facility];  Service: General;  Laterality: Right;   BREAST SURGERY Right 03/14/2016   Biopsy   CAPSULECTOMY Right 09/13/2016   Procedure: RIGHT CAPSULECTOMY;  Surgeon: Irene Limbo, MD;  Location: [facility];  Service: Plastics;  Laterality: Right;   CATARACT EXTRACTION, BILATERAL Bilateral 08/10/17 right eye, 08/31/17 left eye   MASS EXCISION Left 02/28/2018   Path: benign.  Procedure: EXCISIONLEFT MEDIAL THIGH MASS ERAS PATHWAY;  Surgeon: Erroll Luna, MD;  Location: [facility];  Service: General;  Laterality: Left;   PORTACATH  PLACEMENT N/A 03/15/2016   Procedure: INSERTION PORT-A-CATH WITH [place];  Surgeon: Alphonsa Overall, MD;  Location: [facility];  Service: General;  Laterality: N/A;   PORTACATH REMOVAL  07/2017   surgical repair left ankle Left 2009   s/p [place]   Age 75   US CAROTID DOPPLER BILATERAL ([place] [facility])  01/2021   <50% bilat int carotid sten, otherwise normal.    Current Medications: No outpatient medications have been marked as taking for the 06/11/21 encounter (Office Visit) with Richardo Priest, MD.     Allergies:   Other, Prednisone, and Penicillins   Social History   Socioeconomic History   Marital status: Divorced    Spouse name: Not on file   Number of children: Not on file   Years of education: Not on file   Highest education level: Not on file  Occupational History   Not on file   Tobacco Use   Smoking status: Former    Packs/day: 1.00    Types: Cigarettes    Quit date: 03/08/1999    Years since quitting: 22.2   Smokeless tobacco: Never  Vaping Use   Vaping Use: Never used  Substance and Sexual Activity   Alcohol use: Yes    Alcohol/week: 7.0 standard drinks    Types: 7 Shots of liquor per week    Comment:  daily   Drug use: No   Sexual activity: Yes    Partners: Male    Comment: 1st intercourse- 69, partners- 10+, current partner- 8 yrs  Other Topics Concern   Not on file  Social History Narrative   Divorced, has a son who lives in [place].   Educ: college   Occup: Optometrist, still working part time.   Tob: quit 2000.     Alc: several times a week--1-2 glasses wine.   No drugs.   Social Determinants of Health   Financial Resource Strain: Not on file  Food Insecurity: Not on file  Transportation Needs: Not on file  Physical Activity: Not on file  Stress: Not on file  Social Connections: Not on file     Family History: The patient's family history includes Stroke in her brother, mother, and son; Suicidality in her father.  ROS:   ROS Please see the history of present illness.     All other systems reviewed and are negative.  EKGs/Labs/Other Studies Reviewed:    The following studies were reviewed today:   EKG:  EKG is sinus rhythm ordered today.  The ekg ordered today is personally reviewed and demonstrates sinus rhythm 57 bpm first-degree AV block left axis deviation  Recent Labs: 02/23/2021: TSH 4.54 04/20/2021: ALT 27; BUN 14; Creatinine 0.80; Hemoglobin 12.6; Platelet Count 163; Potassium 4.9; Sodium 138  Recent Lipid Panel    Component Value Date/Time   CHOL 311 (H) 02/23/2021 1451   TRIG 242.0 (H) 02/23/2021 1451   HDL 64.10 02/23/2021 1451   CHOLHDL 5 02/23/2021 1451   VLDL 48.4 (H) 02/23/2021 1451   LDLCALC 92 03/23/2020 0000   LDLDIRECT 218.0 02/23/2021 1451    Physical Exam:    VS:  There were no vitals taken for this  visit.    Wt Readings from Last 3 Encounters:  05/14/21 161 lb (73 kg)  05/03/21 170 lb 6.4 oz (77.3 kg)  04/20/21 165 lb (74.8 kg)     GEN:  Well nourished, well developed in no acute distress HEENT: Normal NECK: No JVD; No  carotid bruits LYMPHATICS: No lymphadenopathy CARDIAC: RRR, no murmurs, rubs, gallops RESPIRATORY:  Clear to auscultation without rales, wheezing or rhonchi  ABDOMEN: Soft, non-tender, non-distended MUSCULOSKELETAL:  No edema; No deformity  SKIN: Warm and dry NEUROLOGIC:  Alert and oriented x 3 PSYCHIATRIC:  Normal affect     Signed, Shirlee More, MD  06/11/2021 9:09 AM    [place]

## 2021-06-11 ENCOUNTER — Ambulatory Visit (INDEPENDENT_AMBULATORY_CARE_PROVIDER_SITE_OTHER): Payer: Medicare Other | Admitting: Cardiology

## 2021-06-11 ENCOUNTER — Other Ambulatory Visit: Payer: Self-pay

## 2021-06-11 ENCOUNTER — Encounter: Payer: Self-pay | Admitting: Cardiology

## 2021-06-11 VITALS — BP 184/92 | HR 64 | Ht 68.0 in | Wt 165.8 lb

## 2021-06-11 DIAGNOSIS — I491 Atrial premature depolarization: Secondary | ICD-10-CM | POA: Diagnosis not present

## 2021-06-11 DIAGNOSIS — I4510 Unspecified right bundle-branch block: Secondary | ICD-10-CM

## 2021-06-11 DIAGNOSIS — I10 Essential (primary) hypertension: Secondary | ICD-10-CM | POA: Diagnosis not present

## 2021-06-11 DIAGNOSIS — R55 Syncope and collapse: Secondary | ICD-10-CM | POA: Diagnosis not present

## 2021-06-11 NOTE — Patient Instructions (Signed)

## 2021-06-14 ENCOUNTER — Encounter: Payer: Self-pay | Admitting: Family Medicine

## 2021-06-14 ENCOUNTER — Ambulatory Visit (INDEPENDENT_AMBULATORY_CARE_PROVIDER_SITE_OTHER): Payer: Medicare Other | Admitting: Family Medicine

## 2021-06-14 ENCOUNTER — Other Ambulatory Visit: Payer: Self-pay

## 2021-06-14 VITALS — BP 184/88 | HR 60 | Ht 68.0 in | Wt 160.6 lb

## 2021-06-14 DIAGNOSIS — S2241XD Multiple fractures of ribs, right side, subsequent encounter for fracture with routine healing: Secondary | ICD-10-CM | POA: Diagnosis not present

## 2021-06-14 DIAGNOSIS — R55 Syncope and collapse: Secondary | ICD-10-CM | POA: Diagnosis not present

## 2021-06-14 NOTE — Progress Notes (Signed)
° °  I, [name], LAT, ATC, am serving as scribe for Dr. Lynne Leader.  Marissa Hall is a 81 y.o. female who presents to [place] at [facility] today for f/u of R 9th rib fx that occurred as a result of a fall on 04/28/21 while the pt was in [place], [place].  Pt was last seen by Dr. Georgina Snell on 05/14/21 and was advised to cont conservative management, including rib belt, Tylenol, and HEP per PT. Pt was evaluated by Dr. Bettina Gavia at [facility], for the syncope. She completed vestibular PT on 03/25/21 and was d/c, having completed 7 visits.  Today, pt reports that she's feeling better and thinks she's healing.  She will still have some occasional pain w/ coughing or sneezing.  Interested in starting to walk for exercise.  Dx testing: 05/14/21 Chest XR  05/03/21 Bilat ribs w/ chest XR  01/26/21 Bilat carotid US  01/13/21 L-spine, L hip, & L ribs w/ chest XR   Pertinent review of systems: No fevers or chills  Relevant historical information: Diabetes.  Hypertension.   Exam:  BP (!) 184/88 (BP Location: Left Arm, Patient Position: Sitting, Cuff Size: Normal)    Pulse 60    Ht 5\' 8"  (1.727 m)    Wt 160 lb 9.6 oz (72.8 kg)    SpO2 98%    BMI 24.42 kg/m  General: Well Developed, well nourished, and in no acute distress.   MSK: Right ribs normal-appearing Normal motion.  Minimally tender      Assessment and Plan: 81 y.o. female with right rib pain after fracture.  Improving with time and home exercise program.  Watchful waiting for now.  Reviewed return to exercise precautions.  Advised her that she probably should start walking in an observed area such as indoor track and a [employer] or with an exercise buddy.  However I do think exercise in general is a great idea for her.  Additionally we reviewed her syncope visits with her cardiologist.  I think she is in good hands.  Recheck back with me as needed  Total encounter time 20 minutes including face-to-face  time with the patient and, reviewing past medical record, and charting on the date of service.   Exercise precautions syncope and recheck precautions.    Discussed warning signs or symptoms. Please see discharge instructions. Patient expresses understanding.   The above documentation has been reviewed and is accurate and complete Lynne Leader, M.D.

## 2021-06-14 NOTE — Patient Instructions (Addendum)
Good to see you today.  Follow-up:as needed.   Recheck as needed.   Ok to start walking. Be safe.

## 2021-06-16 ENCOUNTER — Encounter: Payer: Self-pay | Admitting: Family Medicine

## 2021-06-16 ENCOUNTER — Other Ambulatory Visit: Payer: Self-pay

## 2021-06-16 ENCOUNTER — Telehealth (INDEPENDENT_AMBULATORY_CARE_PROVIDER_SITE_OTHER): Payer: Medicare Other | Admitting: Family Medicine

## 2021-06-16 VITALS — BP 172/83 | Wt 160.0 lb

## 2021-06-16 DIAGNOSIS — G4733 Obstructive sleep apnea (adult) (pediatric): Secondary | ICD-10-CM | POA: Diagnosis not present

## 2021-06-16 DIAGNOSIS — I10 Essential (primary) hypertension: Secondary | ICD-10-CM

## 2021-06-16 DIAGNOSIS — G441 Vascular headache, not elsewhere classified: Secondary | ICD-10-CM | POA: Diagnosis not present

## 2021-06-16 MED ORDER — HYDRALAZINE HCL 25 MG PO TABS: 25.0000 mg | ORAL_TABLET | Freq: Three times a day (TID) | ORAL | 1 refills | Status: DC

## 2021-06-16 NOTE — Progress Notes (Signed)
Virtual Visit via Video Note  I connected with Marissa Hall on 06/16/21 at  4:00 PM EST by a video enabled telemedicine application and verified that I am speaking with the correct person using two identifiers.  Location patient: [facility] Location provider:work or home office Persons participating in the virtual visit: patient, provider  I discussed the limitations and requested verbal permission for telemedicine visit. The patient expressed understanding and agreed to proceed.   HPI: 81 y/o WF being seen today for headache and elevated blood pressures. She got a blood pressure cuff 5 to 7 days ago and started checking blood pressure and has noted consistent with the elevated blood pressures: 485I to 627O systolic over 35K to 09F diastolic.  When it says it high as 200/100 she feels significant headache on top of head generally Denies dizziness, vision abnormalities, chest pain, shortness of breath, or syncope. No swelling of legs. She notes that she wears an apple watch and it tracks her sleep somehow.  Apparently she gets anywhere from 4 to 6 hours of sleep per night.  The trazodone 50 mg she takes seems to help a little but does not last very long. She drinks 1 mixed drink per night. No pain other than the headaches she has had some with her high blood pressures.  Of note she was seen by Dr. Bettina Gavia in cardiology on 06/11/2021 for her history of syncope. No significant bradycardia or sustained arrhythmia has been noted on her monitor. He recommended she trend her blood pressures at home, avoid inducing hypotension in the future if at all possible, and if another syncopal episode occurs he said an implanted loop recorder would be appropriate  ROS as above, plus--> no fevers,  no wheezing, no cough, no rashes, no melena/hematochezia.  No polyuria or polydipsia.  No myalgias or arthralgias.  No focal weakness, paresthesias, or tremors.  No acute vision or hearing abnormalities.  No dysuria or unusual/new  urinary urgency or frequency.  No recent changes in lower legs. No n/v/d or abd pain.  No palpitations.    Allergies  Allergen Reactions   Other Other (See Comments)    STEROIDS- emotional   Prednisone Other (See Comments)    Other reaction(s): Mental Status Changes (intolerance)   Penicillins Other (See Comments)    Unsure of reaction, was 81 years old   -COVID-19 vaccine status:  Immunization History  Administered Date(s) Administered   Fluad Quad(high Dose 65+) 03/13/2019, 05/11/2020, 02/18/2021   Influenza, High Dose Seasonal PF 05/03/2017   Influenza,inj,Quad PF,6+ Mos 04/11/2016   PFIZER(Purple Top)SARS-COV-2 Vaccination 07/21/2019, 08/13/2019   Zoster, Live 06/06/2010     ROS: See pertinent positives and negatives per HPI.  Past Medical History:  Diagnosis Date   Acute DVT (deep venous thrombosis) ([place]) 04/23/2019   Right popliteal   Anxiety and depression 1964   oncologist started duloxetine 12/2016   Breast cancer ([place]) 03/14/2016   Clinical stage 2A: (triple neg): Right breast, upper inner quadrant, 03/2016.  Neoadjuvant chemo x 5 cycles,lumpectomy 4 mo later, then RT started 10/2016.  Adjuvant Xeloda [id number].  SWOG research trial pt 04/2017--pt randomized to pembrolizumab immunotherapy.  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  A1c Mar 12, 2018 was 7.0% at Dr. Shirlyn Goltz.    Epidermoid cyst of vulva    Chronic epidermoid cyst of the vulva.  Excision done 02/2019   GERD (gastroesophageal reflux disease) 2013   Hyperlipidemia 1986   Hypertension 2008   2022 ->addition of hctz to lisinopril led to 72ml  drop in GFR   Hypothyroidism 1988   Diagnosed in her 64s.  Managed by Endocrinologist   Lumbar radiculopathy 04/2019   Dr. Tamala Julian to get plain films of LB and hip (considering MRI due to her hx of cancer)   Nonischemic cardiomyopathy ([place])    Hx of takotsubo CM   Osteoporosis 2015   pt states "osteopenia", but then says that she refused to take the rx med for this condition, so I suspect she had osteoporosis.   Peripheral neuropathy 2017   Patient states diabetic neuropathy in feet prior to starting chemotherapy and then worsened by chemo.    Syncope and collapse    05/2021 while in [place].  No prodrome.  Monitor ordered and cardiology referral ordered 05/14/21   TIA (transient ischemic attack) 03/26/2011   2012: question of (HA + R eye "floaters"). CT in ED neg acute. Not admitted, no f/u testing done.  ?ocular migraine?    Past Surgical History:  Procedure Laterality Date   ABDOMINAL HYSTERECTOMY  1972   APPENDECTOMY  1972   BREAST ENHANCEMENT SURGERY  1982   BREAST IMPLANT REMOVAL Right 09/13/2016   Procedure: REMOVAL RIGHT BREAST IMPLANT;  Surgeon: Irene Limbo, MD;  Location: [facility];  Service: Plastics;  Laterality: Right;   BREAST LUMPECTOMY WITH RADIOACTIVE SEED AND SENTINEL LYMPH NODE BIOPSY Right 09/13/2016   Procedure: RIGHT BREAST LUMPECTOMY WITH RADIOACTIVE SEED X 2 AND SENTINEL LYMPH NODE BIOPSY;  Surgeon: Alphonsa Overall, MD;  Location: [facility];  Service: General;  Laterality: Right;   BREAST SURGERY Right 03/14/2016   Biopsy   CAPSULECTOMY Right 09/13/2016   Procedure: RIGHT CAPSULECTOMY;  Surgeon: Irene Limbo, MD;  Location: [facility];  Service: Plastics;  Laterality: Right;   CATARACT EXTRACTION, BILATERAL Bilateral 08/10/17 right eye, 08/31/17 left eye   MASS EXCISION Left 02/28/2018   Path: benign.  Procedure: EXCISIONLEFT MEDIAL THIGH MASS ERAS PATHWAY;  Surgeon: Erroll Luna, MD;  Location: [facility];  Service: General;  Laterality: Left;   PORTACATH PLACEMENT N/A 03/15/2016   Procedure: INSERTION PORT-A-CATH WITH [place];  Surgeon: Alphonsa Overall, MD;  [facility];  Service: General;  Laterality: N/A;   PORTACATH REMOVAL  07/2017   surgical repair left ankle Left 2009   s/p [place]   Age 80   US CAROTID DOPPLER BILATERAL ([facility] [facility] HX)  01/2021   <50% bilat int carotid sten, otherwise normal.     Current Outpatient Medications:    aspirin 81 MG EC tablet, Take 1 tablet (81 mg total) by mouth daily. Swallow whole., Disp: 30 tablet, Rfl: 12   atorvastatin (LIPITOR) 80 MG tablet, Take 1 tablet (80 mg total) by mouth every evening., Disp: 90 tablet, Rfl: 3   cholecalciferol (VITAMIN D3) 25 MCG (1000 UNIT) tablet, Take 1,000 Units by mouth daily., Disp: , Rfl:    DULoxetine (CYMBALTA) 30 MG capsule, TAKE ONE CAPSULE BY MOUTH DAILY, Disp: 30 capsule, Rfl: 5   levothyroxine (SYNTHROID) 112 MCG tablet, Take 1 tablet (112 mcg total) by mouth daily  before breakfast., Disp: 90 tablet, Rfl: 3   lisinopril (ZESTRIL) 40 MG tablet, Take 1 tablet (40 mg total) by mouth daily., Disp: 90 tablet, Rfl: 3   metFORMIN (GLUCOPHAGE-XR) 500 MG 24 hr tablet, Take 1 tablet (500 mg total) by mouth in the morning and at bedtime. (Patient taking differently: Take 1,000 mg by mouth daily before supper.), Disp: 180 tablet, Rfl: 3   Propylene Glycol (SYSTANE BALANCE OP), Place 1 drop into both eyes daily as needed (for dry eyes)., Disp: , Rfl:    traZODone (DESYREL) 50 MG tablet, Take 1 tablet (50 mg total) by mouth at bedtime., Disp: 90 tablet, Rfl: 3   diclofenac Sodium (VOLTAREN) 1 % GEL, Apply 2 g topically 4 (four) times daily. (Patient not taking: Reported on 06/16/2021), Disp: 150 g, Rfl: 0  EXAM:  VITALS per patient if applicable:  Vitals with BMI 06/16/2021 06/14/2021 06/11/2021  Height - 5\' 8"  -  Weight 160 lbs 160 lbs 10 oz -  BMI 16.10 96.04 -  Systolic 540 981  191  Diastolic 83 88 92  Pulse - 60 -    GENERAL: alert, oriented, appears well and in no acute distress  HEENT: atraumatic, conjunttiva clear, no obvious abnormalities on inspection of external nose and ears  NECK: normal movements of the head and neck  LUNGS: on inspection no signs of respiratory distress, breathing rate appears normal, no obvious gross SOB, gasping or wheezing  CV: no obvious cyanosis  MS: moves all visible extremities without noticeable abnormality  PSYCH/NEURO: pleasant and cooperative, no obvious depression or anxiety, speech and thought processing grossly intact  LABS: none today    Chemistry      Component Value Date/Time   NA 138 04/20/2021 1008   NA 136 05/01/2017 0914   K 4.9 04/20/2021 1008   K 4.5 05/01/2017 0914   CL 105 04/20/2021 1008   CO2 25 04/20/2021 1008   CO2 24 05/01/2017 0914   BUN 14 04/20/2021 1008   BUN 14.9 05/01/2017 0914   CREATININE 0.80 04/20/2021 1008   CREATININE 0.8 05/01/2017 0914   GLU 181 09/20/2019 0000      Component Value Date/Time   CALCIUM 9.3 04/20/2021 1008   CALCIUM 9.6 05/01/2017 0914   ALKPHOS 63 04/20/2021 1008   ALKPHOS 75 05/01/2017 0914   AST 19 04/20/2021 1008   AST 51 (H) 05/01/2017 0914   ALT 27 04/20/2021 1008   ALT 62 (H) 05/01/2017 0914   BILITOT 1.1 04/20/2021 1008   BILITOT 1.30 (H) 05/01/2017 0914     ASSESSMENT AND PLAN:  Discussed the following assessment and plan:  #1 uncontrolled hypertension. Hard to tell how long its been significantly out of control--- she just started home monitoring approximately 5 days ago. Has some headaches when severely high. Start hydralazine 25 mg 3 times daily. Will continue lisinopril 40 mg/day as well. If blood pressures not consistently improved in the next 3 days then she will call and let me know.  We discussed the fact that 1 possible cause of uncontrolled hypertension is obstructive sleep apnea. She sleeps poorly but has no one to observe  her sleeping so she did not know if she snores or has apneic spells in sleep. She does feel that her sleep is broken and has fatigue and excessive daytime sleepiness. Refer to sleep specialist for consideration of sleep study.  I discussed the assessment and treatment plan with the patient. The patient was provided an opportunity to ask questions and  all were answered. The patient agreed with the plan and demonstrated an understanding of the instructions.   F/u: 10 to 14 days follow-up blood pressure and headaches.  Signed:  Crissie Sickles, MD           06/16/2021

## 2021-06-21 ENCOUNTER — Telehealth: Payer: Medicare Other | Admitting: Family Medicine

## 2021-06-24 ENCOUNTER — Encounter: Payer: Self-pay | Admitting: Hematology and Oncology

## 2021-07-12 NOTE — Progress Notes (Signed)
GYNECOLOGY  VISIT   HPI: 81 y.o.   Divorced  Caucasian  female   G1P1 with No LMP recorded. Patient has had a hysterectomy.   here for  vaginal cyst, which is getting larger.   Pain with wiping.   This is the third time she has had a cyst of the vulva.   No drainage.   No fever.   GYNECOLOGIC HISTORY: No LMP recorded. Patient has had a hysterectomy. Contraception:  Hyst Menopausal hormone therapy:  none Last mammogram:  06-03-21 Hx Rt.Br.Ca w/lumpectomy/Neg/BiRads2  Last pap smear:   01-17-19 Neg        OB History     Gravida  1   Para  1   Term      Preterm      AB      Living  1      SAB      IAB      Ectopic      Multiple      Live Births                 Patient Active Problem List   Diagnosis Date Noted   Syncope and collapse 06/10/2021   Closed fracture of multiple ribs of right side 04/30/2021   Acquired hypothyroidism 02/23/2021   Type 2 diabetes mellitus with diabetic polyneuropathy, without long-term current use of insulin ([place]) 02/23/2021   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   Diabetes mellitus type 2 without retinopathy ([place]) 09/21/2020   Meibomian gland dysfunction (MGD) of both eyes 09/21/2020   History of COVID-19 06/23/2020   COVID-19 virus infection 05/27/2020   Piriformis syndrome of right side 05/16/2019   Lower extremity pain, right 04/23/2019   DVT (deep venous thrombosis) ([place]) 04/23/2019   Lumbar radiculopathy, right 04/02/2019   Cataracts, bilateral 07/2017   Other long term (current) drug therapy 06/14/2017   Anxiety and depression    History of breast cancer 03/02/2017   History of therapeutic radiation 03/02/2017   Breast pain, right 10/03/2016   Type 2 diabetes mellitus ([place]) 08/23/2016   Hypertension associated with diabetes ([place]) 08/23/2016   Hypothyroidism 08/23/2016   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   Port catheter in place 05/09/2016   Peripheral neuropathy 05/09/2016   Breast cancer of  upper-inner quadrant of right female breast ([place]) 03/09/2016   Major depressive disorder, recurrent ([place]) 16/03/9603   Uncomplicated alcohol dependence ([place]) 02/19/2014   Osteoporosis 2015   GERD (gastroesophageal reflux disease) 2013   TIA (transient ischemic attack) 03/26/2011   Epidermoid cyst of vulva 2008   Hyperlipidemia 1986    Past Medical History:  Diagnosis Date   Acute DVT (deep venous thrombosis) ([place]) 04/23/2019   Right popliteal   Anxiety and depression 1964   oncologist started duloxetine 12/2016   Breast cancer ([facility]) 03/14/2016   Clinical stage 2A: (triple neg): Right breast, upper inner quadrant, 03/2016.  Neoadjuvant chemo x 5 cycles,lumpectomy 4 mo later, then RT started 10/2016.  Adjuvant Xeloda [id number].  SWOG research trial pt 04/2017--pt randomized to pembrolizumab immunotherapy.  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  A1c Mar 12, 2018 was  7.0% at Dr. Shirlyn Goltz.    Epidermoid cyst of vulva    Chronic epidermoid cyst of the vulva.  Excision done 02/2019   GERD (gastroesophageal reflux disease) 2013   Hyperlipidemia 1986   Hypertension 2008   2022 ->addition of hctz to lisinopril led to 36ml drop in GFR. HCTZ d/cd.   Hypothyroidism 1988   Diagnosed in her 60s.  Managed by Endocrinologist   Lumbar radiculopathy 04/2019   Dr. Tamala Julian to get plain films of LB and hip (considering MRI due to her hx of cancer)   Nonischemic cardiomyopathy ([place])    Hx of takotsubo CM   Osteoporosis 2015   pt states "osteopenia", but then says that she refused to take the rx med for this condition, so I suspect she had  osteoporosis.   Peripheral neuropathy 2017   Patient states diabetic neuropathy in feet prior to starting chemotherapy and then worsened by chemo.    Syncope and collapse    05/2021 while in [place].  No prodrome.  Monitor ordered and cardiology referral ordered 05/14/21   TIA (transient ischemic attack) 03/26/2011   2012: question of (HA + R eye "floaters"). CT in ED neg acute. Not admitted, no f/u testing done.  ?ocular migraine?    Past Surgical History:  Procedure Laterality Date   ABDOMINAL HYSTERECTOMY  1972   APPENDECTOMY  1972   BREAST ENHANCEMENT SURGERY  1982   BREAST IMPLANT REMOVAL Right 09/13/2016   Procedure: REMOVAL RIGHT BREAST IMPLANT;  Surgeon: Irene Limbo, MD;  Location: [facility];  Service: Plastics;  Laterality: Right;   BREAST LUMPECTOMY WITH RADIOACTIVE SEED AND SENTINEL LYMPH NODE BIOPSY Right 09/13/2016   Procedure: RIGHT BREAST LUMPECTOMY WITH RADIOACTIVE SEED X 2 AND SENTINEL LYMPH NODE BIOPSY;  Surgeon: Alphonsa Overall, MD;  Location: [facility];  Service: General;  Laterality: Right;   BREAST SURGERY Right 03/14/2016   Biopsy   CAPSULECTOMY Right 09/13/2016   Procedure: RIGHT CAPSULECTOMY;  Surgeon: Irene Limbo, MD;  Location: [facility];  Service: Plastics;  Laterality: Right;   CATARACT EXTRACTION, BILATERAL Bilateral 08/10/17 right eye, 08/31/17 left eye   MASS EXCISION Left 02/28/2018   Path: benign.  Procedure: EXCISIONLEFT MEDIAL THIGH MASS ERAS PATHWAY;  Surgeon: Erroll Luna, MD;  Location: [facility];  Service: General;  Laterality: Left;   PORTACATH PLACEMENT N/A 03/15/2016   Procedure: INSERTION PORT-A-CATH WITH [place];  Surgeon: Alphonsa Overall, MD;  [facility];  Service: General;  Laterality: N/A;   PORTACATH REMOVAL  07/2017   surgical repair left ankle Left 2009   s/p [place]    TONSILLECTOMY AND ADENOIDECTOMY  1948   Age 89   US CAROTID DOPPLER BILATERAL ([facility] HX)  01/2021    <50% bilat int carotid sten, otherwise normal.    Current Outpatient Medications  Medication Sig Dispense Refill   aspirin 81 MG EC tablet Take 1 tablet (81 mg total) by mouth daily. Swallow whole. 30 tablet 12   atorvastatin (LIPITOR) 80 MG tablet Take 1 tablet (80 mg total) by mouth every evening. 90 tablet 3   cholecalciferol (VITAMIN D3) 25 MCG (1000 UNIT) tablet Take 1,000 Units by mouth daily.     diclofenac Sodium (VOLTAREN) 1 % GEL Apply 2 g topically 4 (four) times daily. 150 g 0   DULoxetine (CYMBALTA) 30 MG capsule TAKE ONE CAPSULE BY MOUTH DAILY 30 capsule 5   hydrALAZINE (APRESOLINE) 25 MG tablet Take 1 tablet (25 mg total) by mouth  3 (three) times daily. 90 tablet 1   levothyroxine (SYNTHROID) 112 MCG tablet Take 1 tablet (112 mcg total) by mouth daily before breakfast. 90 tablet 3   lisinopril (ZESTRIL) 40 MG tablet Take 1 tablet (40 mg total) by mouth daily. 90 tablet 3   metFORMIN (GLUCOPHAGE-XR) 500 MG 24 hr tablet Take 1 tablet (500 mg total) by mouth in the morning and at bedtime. (Patient taking differently: Take 1,000 mg by mouth daily before supper.) 180 tablet 3   Propylene Glycol (SYSTANE BALANCE OP) Place 1 drop into both eyes daily as needed (for dry eyes).     traZODone (DESYREL) 50 MG tablet Take 1 tablet (50 mg total) by mouth at bedtime. (Patient taking differently: Take 50 mg by mouth at bedtime as needed.) 90 tablet 3   UNABLE TO FIND Med Name: PEA supplement     No current facility-administered medications for this visit.     ALLERGIES: Other, Prednisone, Amlodipine, Hydrochlorothiazide, and Penicillins  Family History  Problem Relation Age of Onset   Stroke Mother    Suicidality Father    Stroke Brother    Stroke Son     Social History   Socioeconomic History   Marital status: Divorced    Spouse name: Not on file   Number of children: Not on file   Years of education: Not on file   Highest education level: Not on file  Occupational History    Not on file  Tobacco Use   Smoking status: Former    Packs/day: 1.00    Types: Cigarettes    Quit date: 03/08/1999    Years since quitting: 22.3   Smokeless tobacco: Never  Vaping Use   Vaping Use: Never used  Substance and Sexual Activity   Alcohol use: Yes    Alcohol/week: 7.0 standard drinks    Types: 7 Shots of liquor per week    Comment:  daily   Drug use: No   Sexual activity: Not Currently    Partners: Male    Birth control/protection: Surgical    Comment: 1st intercourse- 19, partners- 10+, current partner- 8 yrs, hysterectomy  Other Topics Concern   Not on file  Social History Narrative   Divorced, has a son who lives in [place].   Educ: college   Occup: Optometrist, still working part time.   Tob: quit 2000.     Alc: several times a week--1-2 glasses wine.   No drugs.   Social Determinants of Health   Financial Resource Strain: Not on file  Food Insecurity: Not on file  Transportation Needs: Not on file  Physical Activity: Not on file  Stress: Not on file  Social Connections: Not on file  Intimate Partner Violence: Not on file    Review of Systems  Constitutional: Negative.   HENT: Negative.    Eyes: Negative.   Respiratory: Negative.    Cardiovascular: Negative.   Gastrointestinal: Negative.   Endocrine: Negative.   Genitourinary:        Vaginal cyst  Musculoskeletal: Negative.   Skin: Negative.   Allergic/Immunologic: Negative.   Neurological: Negative.   Hematological: Negative.   Psychiatric/Behavioral: Negative.     PHYSICAL EXAMINATION:    BP 140/80    Pulse 75    Resp 16    Wt 165 lb (74.8 kg)    BMI 25.09 kg/m     General appearance: alert, cooperative and appears stated age Head: Normocephalic, without obvious abnormality, atraumatic Neck: no adenopathy, supple, symmetrical, trachea  midline and thyroid normal to inspection and palpation Lungs: clear to auscultation bilaterally Breasts: normal appearance, no masses or tenderness, No  nipple retraction or dimpling, No nipple discharge or bleeding, No axillary or supraclavicular adenopathy Heart: regular rate and rhythm Abdomen: soft, non-tender, no masses,  no organomegaly Extremities: extremities normal, atraumatic, no cyanosis or edema Skin: Skin color, texture, turgor normal. No rashes or lesions Lymph nodes: Cervical, supraclavicular, and axillary nodes normal. No abnormal inguinal nodes palpated Neurologic: Grossly normal  Pelvic: External genitalia:  right inferior labia majora with skin fluctuant abscess 1 cm - pin point drainage point, at site of sebaceous cyst.               Urethra:  normal appearing urethra with no masses, tenderness or lesions              Bartholins and Skenes: normal      I and D abscess Consent for drainage.  Sterile prep with betadine.  Local 1% lidocaine [id number], exp 09/2022. Scalpel used to open abscess.  Pus drained and wound culture taken.   Chaperone was present for exam:  Estill Bamberg, CMA  ASSESSMENT  Abscess of sebaceous cyst.  PLAN  I and D done.  Wound culture collected.  Bactrim DS po bid x 7 days.  Clean with soap and water.  Patient understands the cyst was not excised.  FU prn.    An After Visit Summary was printed and given to the patient.

## 2021-07-13 ENCOUNTER — Encounter: Payer: Self-pay | Admitting: Family Medicine

## 2021-07-13 ENCOUNTER — Other Ambulatory Visit: Payer: Self-pay

## 2021-07-13 ENCOUNTER — Encounter: Payer: Self-pay | Admitting: Gynecology

## 2021-07-13 ENCOUNTER — Ambulatory Visit (INDEPENDENT_AMBULATORY_CARE_PROVIDER_SITE_OTHER): Payer: Medicare Other | Admitting: Obstetrics and Gynecology

## 2021-07-13 VITALS — BP 140/80 | HR 75 | Resp 16 | Wt 165.0 lb

## 2021-07-13 DIAGNOSIS — L089 Local infection of the skin and subcutaneous tissue, unspecified: Secondary | ICD-10-CM

## 2021-07-13 DIAGNOSIS — L723 Sebaceous cyst: Secondary | ICD-10-CM | POA: Diagnosis not present

## 2021-07-13 MED ORDER — SULFAMETHOXAZOLE-TRIMETHOPRIM 800-160 MG PO TABS: 1.0000 | ORAL_TABLET | Freq: Two times a day (BID) | ORAL | 0 refills | Status: DC

## 2021-07-13 NOTE — Telephone Encounter (Signed)
FYI  Spoke with pt, she was unable to do 1130 virtual appt today due to schedule conflict. Scheduled 4pm in office appt tomorrow. Advised if anything changed regarding appt she would be informed.

## 2021-07-13 NOTE — Telephone Encounter (Signed)
Please tell her to increase her hydralazine to TWO of the 25mg  tabs three times a day.  Continue lisinopril 40mg  daily. I still want to see her tomorrow.

## 2021-07-13 NOTE — Patient Instructions (Signed)
Skin Abscess A skin abscess is an infected area on or under your skin that contains a collection of pus and other material. An abscess may also be called a furuncle, carbuncle, or boil. An abscess can occur in or on almost any part of your body. Some abscesses break open (rupture) on their own. Most continue to get worse unless they are treated. The infection can spread deeper into the body and eventually into your blood, which can make you feel ill. Treatment usually involves draining the abscess. What are the causes? An abscess occurs when germs, like bacteria, pass through your skin and cause an infection. This may be caused by: A scrape or cut on your skin. A puncture wound through your skin, including a needle injection or insect bite. Blocked oil or sweat glands. Blocked and infected hair follicles. A cyst that forms beneath your skin (sebaceous cyst) and becomes infected. What increases the risk? This condition is more likely to develop in people who: Have a weak body defense system (immune system). Have diabetes. Have dry and irritated skin. Get frequent injections or use illegal IV drugs. Have a foreign body in a wound, such as a splinter. Have problems with their lymph system or veins. What are the signs or symptoms? Symptoms of this condition include: A painful, firm bump under the skin. A bump with pus at the top. This may break through the skin and drain. Other symptoms include: Redness surrounding the abscess site. Warmth. Swelling of the lymph nodes (glands) near the abscess. Tenderness. A sore on the skin. How is this diagnosed? This condition may be diagnosed based on: A physical exam. Your medical history. A sample of pus. This may be used to find out what is causing the infection. Blood tests. Imaging tests, such as an ultrasound, CT scan, or MRI. How is this treated? A small abscess that drains on its own may not need treatment. Treatment for larger abscesses  may include: Moist heat or heat pack applied to the area several times a day. A procedure to drain the abscess (incision and drainage). Antibiotic medicines. For a severe abscess, you may first get antibiotics through an IV and then change to antibiotics by mouth. Follow these instructions at home: Medicines  Take over-the-counter and prescription medicines only as told by your health care provider. If you were prescribed an antibiotic medicine, take it as told by your health care provider. Do not stop taking the antibiotic even if you start to feel better. Abscess care  If you have an abscess that has not drained, apply heat to the affected area. Use the heat source that your health care provider recommends, such as a moist heat pack or a heating pad. Place a towel between your skin and the heat source. Leave the heat on for 20-30 minutes. Remove the heat if your skin turns bright red. This is especially important if you are unable to feel pain, heat, or cold. You may have a greater risk of getting burned. Follow instructions from your health care provider about how to take care of your abscess. Make sure you: Cover the abscess with a bandage (dressing). Change your dressing or gauze as told by your health care provider. Wash your hands with soap and water before you change the dressing or gauze. If soap and water are not available, use hand sanitizer. Check your abscess every day for signs of a worsening infection. Check for: More redness, swelling, or pain. More fluid or blood. Warmth. More  pus or a bad smell. General instructions To avoid spreading the infection: Do not share personal care items, towels, or hot tubs with others. Avoid making skin contact with other people. Keep all follow-up visits as told by your health care provider. This is important. Contact a health care provider if you have: More redness, swelling, or pain around your abscess. More fluid or blood coming from  your abscess. Warm skin around your abscess. More pus or a bad smell coming from your abscess. A fever. Muscle aches. Chills or a general ill feeling. Get help right away if you: Have severe pain. See red streaks on your skin spreading away from the abscess. Summary A skin abscess is an infected area on or under your skin that contains a collection of pus and other material. A small abscess that drains on its own may not need treatment. Treatment for larger abscesses may include having a procedure to drain the abscess and taking an antibiotic. This information is not intended to replace advice given to you by your health care provider. Make sure you discuss any questions you have with your health care provider.  Epidermoid Cyst An epidermoid cyst, also known as epidermal cyst, is a sac made of skin tissue. The sac contains a substance called keratin. Keratin is a protein that is normally secreted through the hair follicles. When keratin becomes trapped in the top layer of skin (epidermis), it can form an epidermoid cyst. Epidermoid cysts can be found anywhere on your body. These cysts are usually harmless (benign), and they may not cause symptoms unless they become inflamed or infected. What are the causes? This condition may be caused by: A blocked hair follicle. A hair that curls and re-enters the skin instead of growing straight out of the skin (ingrown hair). A blocked pore. Irritated skin. An injury to the skin. Certain conditions that are passed along from parent to child (inherited). Human papillomavirus (HPV). This happens rarely when cysts occur on the bottom of the feet. Long-term (chronic) sun damage to the skin. What increases the risk? The following factors may make you more likely to develop an epidermoid cyst: Having acne. Being female. Having an injury to the skin. Being past puberty. Having certain rare genetic disorders. What are the signs or symptoms? The only  symptom of this condition may be a small, painless lump underneath the skin. When an epidermal cyst ruptures, it may become inflamed. True infection in cysts is rare. Symptoms may include: Redness. Inflammation. Tenderness. Warmth. Keratin draining from the cyst. Keratin is grayish-white, bad-smelling substance. Pus draining from the cyst. How is this diagnosed? This condition is diagnosed with a physical exam. In some cases, you may have a sample of tissue (biopsy) taken from your cyst to be examined under a microscope or tested for bacteria. You may be referred to a health care provider who specializes in skin care (dermatologist). How is this treated? If a cyst becomes inflamed, treatment may include: Opening and draining the cyst, done by a health care provider. After draining, minor surgery to remove the rest of the cyst may be done. Taking antibiotic medicine. Having injections of medicines (steroids) that help to reduce inflammation. Having surgery to remove the cyst. Surgery may be done if the cyst: Becomes large. Bothers you. Has a chance of turning into cancer. Do not try to open a cyst yourself. Follow these instructions at home: Medicines If you were prescribed an antibiotic medicine, take it it as told by your health  care provider. Do not stop using the antibiotic even if you start to feel better. Take over-the-counter and prescription medicines only as told by your health care provider. General instructions Keep the area around your cyst clean and dry. Wear loose, dry clothing. Avoid touching your cyst. Check your cyst every day for signs of infection. Check for: Redness, swelling, or pain. Fluid or blood. Warmth. Pus or a bad smell. Keep all follow-up visits. This is important. How is this prevented? Wear clean, dry, clothing. Avoid wearing tight clothing. Keep your skin clean and dry. Take showers or baths every day. Contact a health care provider if: Your cyst  develops symptoms of infection. Your condition is not improving or is getting worse. You develop a cyst that looks different from other cysts you have had. You have a fever. Get help right away if: Redness spreads from the cyst into the surrounding area. Summary An epidermoid cyst is a sac made of skin tissue. These cysts are usually harmless (benign), and they may not cause symptoms unless they become inflamed. If a cyst becomes inflamed, treatment may include surgery to open and drain the cyst, or to remove it. Treatment may also include medicines by mouth or through an injection. Take over-the-counter and prescription medicines only as told by your health care provider. If you were prescribed an antibiotic medicine, take it as told by your health care provider. Do not stop using the antibiotic even if you start to feel better. Contact a health care provider if your condition is not improving or is getting worse. Keep all follow-up visits as told by your health care provider. This is important. This information is not intended to replace advice given to you by your health care provider. Make sure you discuss any questions you have with your health care provider. Document Revised: 08/28/2019 Document Reviewed: 08/28/2019 Elsevier Patient Education  [place] Revised: 09/13/2018 Document Reviewed: 07/06/2017 Elsevier Patient Education  2022 [employer].

## 2021-07-14 ENCOUNTER — Telehealth (INDEPENDENT_AMBULATORY_CARE_PROVIDER_SITE_OTHER): Payer: Medicare Other | Admitting: Family Medicine

## 2021-07-14 ENCOUNTER — Encounter: Payer: Self-pay | Admitting: Family Medicine

## 2021-07-14 VITALS — BP 184/89 | HR 86 | Wt 165.0 lb

## 2021-07-14 DIAGNOSIS — F419 Anxiety disorder, unspecified: Secondary | ICD-10-CM | POA: Diagnosis not present

## 2021-07-14 DIAGNOSIS — H538 Other visual disturbances: Secondary | ICD-10-CM | POA: Diagnosis not present

## 2021-07-14 DIAGNOSIS — I10 Essential (primary) hypertension: Secondary | ICD-10-CM | POA: Diagnosis not present

## 2021-07-14 DIAGNOSIS — I160 Hypertensive urgency: Secondary | ICD-10-CM | POA: Diagnosis not present

## 2021-07-14 DIAGNOSIS — R410 Disorientation, unspecified: Secondary | ICD-10-CM | POA: Diagnosis not present

## 2021-07-14 DIAGNOSIS — R519 Headache, unspecified: Secondary | ICD-10-CM | POA: Diagnosis not present

## 2021-07-14 NOTE — Progress Notes (Signed)
Virtual Visit via Video Note  I connected with Marissa Hall  on 07/14/21 at  4:00 PM EST by a video enabled telemedicine application and verified that I am speaking with the correct person using two identifiers.  Location patient: [place] Location provider:work or home office Persons participating in the virtual visit: patient, provider  I discussed the limitations and requested verbal permission for telemedicine visit. The patient expressed understanding and agreed to proceed.   81 y/o female being seen today for elevated blood pressures. I last saw her 06/16/2021. A/P as of that visit: "#1 uncontrolled hypertension. Hard to tell how long its been significantly out of control--- she just started home monitoring approximately 5 days ago. Has some headaches when severely high. Start hydralazine 25 mg 3 times daily. Will continue lisinopril 40 mg/day as well. If blood pressures not consistently improved in the next 3 days then she will call and let me know.  We discussed the fact that 1 possible cause of uncontrolled hypertension is obstructive sleep apnea.  She sleeps poorly but has no one to observe her sleeping so she did not know if she snores or has apneic spells in sleep. She does feel that her sleep is broken and has fatigue and excessive daytime sleepiness. Refer to sleep specialist for consideration of sleep study."  INTERIM HX: She had called yesterday to report elevated blood pressures and I advised her to increase her hydralazine to 2 of the 25 mg tabs 3 times a day and continue lisinopril 40 mg a day--ranged appointment for today..  Blood pressures have remained up and she does not feel well.  Has been having headaches on the top of her head most mornings when she wakes up.  Today it was significantly worse, possibly the worst headache she is ever felt.  She had some nausea and felt lightheaded, some intermittent feeling of blurred vision, some intermittent cognitive fogging.  Took a  shower and felt like she might pass out in the shower but did not.  She got little shaky at that time and ate some chocolate and felt better.  Her sugar was 140 at that time. Home blood pressures have still been in the 518A to 416S systolic range and in the 06T to 01S diastolic---essentially since the last time I saw her about a month ago. The onset of the severe headache today was prior to increasing her dose of hydralazine. States that she did take the increased dose of hydralazine this morning after waking up and also earlier this afternoon.  Current blood pressure is 184/89, heart rate 86.   Allergies  Allergen Reactions   Other Other (See Comments)    STEROIDS- emotional   Prednisone Other (See Comments)    Other reaction(s): Mental Status Changes (intolerance)   Amlodipine     "Side effect"   Hydrochlorothiazide     Elevated sCr   Penicillins Other (See Comments)    Unsure of reaction, was 81 years old   -COVID-19 vaccine status:  Immunization History  Administered Date(s) Administered   Fluad Quad(high Dose 65+) 03/13/2019, 05/11/2020, 02/18/2021   Influenza, High Dose Seasonal PF 05/03/2017   Influenza,inj,Quad PF,6+ Mos 04/11/2016   PFIZER(Purple Top)SARS-COV-2 Vaccination 07/21/2019, 08/13/2019   Zoster, Live 06/06/2010     ROS: See pertinent positives and negatives per HPI.  Past Medical History:  Diagnosis Date   Acute DVT (deep venous thrombosis) ([place]) 04/23/2019   Right popliteal   Anxiety and depression 1964   oncologist started duloxetine 12/2016  Breast cancer ([place]) 03/14/2016   Clinical stage 2A: (triple neg): Right breast, upper inner quadrant, 03/2016.  Neoadjuvant chemo x 5 cycles,lumpectomy 4 mo later, then RT started 10/2016.  Adjuvant Xeloda [id number].  SWOG research trial pt 04/2017--pt randomized to pembrolizumab immunotherapy.  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 Cancer-free at 05/2018 onc  f/u.   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  A1c Mar 12, 2018 was 7.0% at Dr. Shirlyn Goltz.    Epidermoid cyst of vulva    Chronic epidermoid cyst of the vulva.  Excision done 02/2019   GERD (gastroesophageal reflux disease) 2013   Hyperlipidemia 1986   Hypertension 2008   2022 ->addition of hctz to lisinopril led to 67ml drop in GFR. HCTZ d/cd.   Hypothyroidism 1988   Diagnosed in her 84s.  Managed by Endocrinologist   Lumbar radiculopathy 04/2019   Dr. Tamala Julian to get plain films of LB and hip (considering MRI due to her hx of cancer)   Nonischemic cardiomyopathy ([place])    Hx of takotsubo CM   Osteoporosis 2015   pt states "osteopenia", but then says that she refused to take the rx med for this condition, so I suspect she had osteoporosis.   Peripheral neuropathy 2017   Patient states diabetic neuropathy in feet prior to starting chemotherapy and then worsened by chemo.    Syncope and collapse    05/2021 while in [place].  No prodrome.  Monitor ordered and cardiology referral ordered 05/14/21   TIA (transient ischemic attack) 03/26/2011   2012: question of (HA + R eye "floaters"). CT in ED neg acute. Not admitted, no f/u testing done.  ?ocular migraine?    Past Surgical History:  Procedure Laterality Date   ABDOMINAL HYSTERECTOMY  1972   APPENDECTOMY  1972   BREAST ENHANCEMENT SURGERY  1982   BREAST IMPLANT REMOVAL Right 09/13/2016   Procedure: REMOVAL RIGHT BREAST IMPLANT;  Surgeon: Irene Limbo, MD;  Location: [facility];  Service: Plastics;  Laterality: Right;   BREAST LUMPECTOMY WITH RADIOACTIVE SEED AND SENTINEL LYMPH NODE BIOPSY Right 09/13/2016   Procedure: RIGHT BREAST LUMPECTOMY WITH RADIOACTIVE SEED X 2 AND SENTINEL LYMPH  NODE BIOPSY;  Surgeon: Alphonsa Overall, MD;  Location: [facility];  Service: General;  Laterality: Right;   BREAST SURGERY Right 03/14/2016   Biopsy   CAPSULECTOMY Right 09/13/2016   Procedure: RIGHT CAPSULECTOMY;  Surgeon: Irene Limbo, MD;  Location: [facility];  Service: Plastics;  Laterality: Right;   CATARACT EXTRACTION, BILATERAL Bilateral 08/10/17 right eye, 08/31/17 left eye   MASS EXCISION Left 02/28/2018   Path: benign.  Procedure: EXCISIONLEFT MEDIAL THIGH MASS ERAS PATHWAY;  Surgeon: Erroll Luna, MD;  Location: [facility];  Service: General;  Laterality: Left;   PORTACATH PLACEMENT N/A 03/15/2016   Procedure: INSERTION PORT-A-CATH WITH [place];  Surgeon: Alphonsa Overall, MD;  Location: [facility];  Service: General;  Laterality: N/A;   PORTACATH REMOVAL  07/2017   surgical repair left ankle Left 2009   s/p [place]   Age 57   US CAROTID DOPPLER BILATERAL ([place] [facility])  01/2021   <50% bilat int carotid sten,  otherwise normal.     Current Outpatient Medications:    aspirin 81 MG EC tablet, Take 1 tablet (81 mg total) by mouth daily. Swallow whole., Disp: 30 tablet, Rfl: 12   atorvastatin (LIPITOR) 80 MG tablet, Take 1 tablet (80 mg total) by mouth every evening., Disp: 90 tablet, Rfl: 3   DULoxetine (CYMBALTA) 30 MG capsule, TAKE ONE CAPSULE BY MOUTH DAILY, Disp: 30 capsule, Rfl: 5   hydrALAZINE (APRESOLINE) 25 MG tablet, Take 1 tablet (25 mg total) by mouth 3 (three) times daily., Disp: 90 tablet, Rfl: 1   levothyroxine (SYNTHROID) 112 MCG tablet, Take 1 tablet (112 mcg total) by mouth daily before breakfast., Disp: 90 tablet, Rfl: 3   lisinopril (ZESTRIL) 40 MG tablet, Take 1 tablet (40 mg total) by mouth daily., Disp: 90 tablet, Rfl: 3   metFORMIN (GLUCOPHAGE-XR) 500 MG 24 hr tablet, Take 1 tablet (500 mg total) by mouth in the morning and at bedtime. (Patient taking differently: Take 1,000 mg by mouth daily  before supper.), Disp: 180 tablet, Rfl: 3   OVER THE COUNTER MEDICATION, Take 400 mg by mouth 2 (two) times daily with a meal. Palmitoylethanolamide, Disp: , Rfl:    Propylene Glycol (SYSTANE BALANCE OP), Place 1 drop into both eyes daily as needed (for dry eyes)., Disp: , Rfl:    sulfamethoxazole-trimethoprim (BACTRIM DS) 800-160 MG tablet, Take 1 tablet by mouth 2 (two) times daily. Take for one week., Disp: 14 tablet, Rfl: 0   traZODone (DESYREL) 50 MG tablet, Take 1 tablet (50 mg total) by mouth at bedtime. (Patient taking differently: Take 50 mg by mouth at bedtime as needed.), Disp: 90 tablet, Rfl: 3   UNABLE TO FIND, Med Name: PEA supplement, Disp: , Rfl:    cholecalciferol (VITAMIN D3) 25 MCG (1000 UNIT) tablet, Take 1,000 Units by mouth daily. (Patient not taking: Reported on 07/14/2021), Disp: , Rfl:    diclofenac Sodium (VOLTAREN) 1 % GEL, Apply 2 g topically 4 (four) times daily. (Patient not taking: Reported on 07/14/2021), Disp: 150 g, Rfl: 0  EXAM:  VITALS per patient if applicable:  Vitals with BMI 07/14/2021 07/13/2021 06/16/2021  Height - - -  Weight 165 lbs 165 lbs 160 lbs  BMI - - 03.50  Systolic 093 818 299  Diastolic 89 80 83  Pulse 86 75 -   GENERAL: alert, oriented, appears well and in no acute distress  HEENT: atraumatic, conjunttiva clear, no obvious abnormalities on inspection of external nose and ears  NECK: normal movements of the head and neck  LUNGS: on inspection no signs of respiratory distress, breathing rate appears normal, no obvious gross SOB, gasping or wheezing  CV: no obvious cyanosis  MS: moves all visible extremities without noticeable abnormality  PSYCH/NEURO: pleasant and cooperative, no obvious depression or anxiety, speech and thought processing grossly intact  LABS: none today    Chemistry      Component Value Date/Time   NA 138 04/20/2021 1008   NA 136 05/01/2017 0914   K 4.9 04/20/2021 1008   K 4.5 05/01/2017 0914   CL 105 04/20/2021  1008   CO2 25 04/20/2021 1008   CO2 24 05/01/2017 0914   BUN 14 04/20/2021 1008   BUN 14.9 05/01/2017 0914   CREATININE 0.80 04/20/2021 1008   CREATININE 0.8 05/01/2017 0914   GLU 181 09/20/2019 0000      Component Value Date/Time   CALCIUM 9.3 04/20/2021 1008   CALCIUM 9.6 05/01/2017 0914   ALKPHOS 63  04/20/2021 1008   ALKPHOS 75 05/01/2017 0914   AST 19 04/20/2021 1008   AST 51 (H) 05/01/2017 0914   ALT 27 04/20/2021 1008   ALT 62 (H) 05/01/2017 0914   BILITOT 1.1 04/20/2021 1008   BILITOT 1.30 (H) 05/01/2017 0914     Lab Results  Component Value Date   WBC 3.6 (L) 04/20/2021   HGB 12.6 04/20/2021   HCT 37.9 04/20/2021   MCV 98.4 04/20/2021   PLT 163 04/20/2021   ASSESSMENT AND PLAN:  Discussed the following assessment and plan:  Hypertensive urgency. I recommended she call EMS or go directly to the emergency department.   Initially I advised her to call EMS but patient states she feels much better right now and can drive herself.  She then said she thinks she may be able to call a friend to take her. We discussed the symptoms to watch for that should prompt her to stop driving and call EMS.  She states she is going to [facility] near her home.   I discussed the assessment and treatment plan with the patient. The patient was provided an opportunity to ask questions and all were answered. The patient agreed with the plan and demonstrated an understanding of the instructions.   F/u: To be determined based on the evaluation in the ER today.  Signed:  Crissie Sickles, MD           07/14/2021

## 2021-07-15 ENCOUNTER — Other Ambulatory Visit: Payer: Self-pay

## 2021-07-15 ENCOUNTER — Encounter: Payer: Self-pay | Admitting: Family Medicine

## 2021-07-15 ENCOUNTER — Ambulatory Visit (INDEPENDENT_AMBULATORY_CARE_PROVIDER_SITE_OTHER): Payer: Medicare Other | Admitting: Family Medicine

## 2021-07-15 VITALS — BP 154/69 | HR 55 | Temp 98.2°F | Ht 68.0 in | Wt 159.4 lb

## 2021-07-15 DIAGNOSIS — R410 Disorientation, unspecified: Secondary | ICD-10-CM | POA: Diagnosis not present

## 2021-07-15 DIAGNOSIS — R519 Headache, unspecified: Secondary | ICD-10-CM | POA: Diagnosis not present

## 2021-07-15 DIAGNOSIS — I160 Hypertensive urgency: Secondary | ICD-10-CM | POA: Diagnosis not present

## 2021-07-15 DIAGNOSIS — I10 Essential (primary) hypertension: Secondary | ICD-10-CM | POA: Diagnosis not present

## 2021-07-15 NOTE — Patient Instructions (Addendum)
Your blood pressure goal is <130 on top and <80 on bottom.  If your blood pressure is still > 130/80 tomorrow morning then increase your hydralazine to THREE of the 25mg  tabs three times a day.    Continue metoprolol 25mg  tab twice a day  Continue lisinopril 40mg  tab once a day.  Call if bp getting back up over 180 on top or 100 on bottom.

## 2021-07-15 NOTE — Progress Notes (Addendum)
OFFICE VISIT  07/15/2021  CC:  Chief Complaint  Patient presents with   Follow-up    hypertension     Patient is a 81 y.o. female who presents for uncontrolled hypertension.  HPI: I saw Tasnia yesterday for virtual visit for headache and elevated blood pressure.  I advised her to go to the ER for hypertensive urgency. She did go to [facility] ER, says she was given prescription for metoprolol. She called this morning stating she still had blood pressure up into the 188 systolic range and headache, although not as bad as yesterday's.  She did not want to go to the ER again due to very long wait yesterday.  I recommended she take 50 mg of Lopressor this morning as well as hydralazine 50 mg.  She also continues on lisinopril 40 mg a day.  CURRENTLY: She has taken one dose of Metoprolol 25 mg and Hydralazine 75 (3 of the 25mg  tabs)  this morning. Since receiving metoprolol, patient denies having headaches, dizziness, or vision changes.      Past Surgical History:  Procedure Laterality Date   ABDOMINAL HYSTERECTOMY  1972   APPENDECTOMY  1972   BREAST ENHANCEMENT SURGERY  1982   BREAST IMPLANT REMOVAL Right 09/13/2016   Procedure: REMOVAL RIGHT BREAST IMPLANT;  Surgeon: Irene Limbo, MD;  Location: [place];  Service: Plastics;  Laterality: Right;   BREAST LUMPECTOMY WITH RADIOACTIVE SEED AND SENTINEL LYMPH NODE BIOPSY Right 09/13/2016   Procedure: RIGHT BREAST LUMPECTOMY WITH RADIOACTIVE SEED X 2 AND SENTINEL LYMPH NODE BIOPSY;  Surgeon: Alphonsa Overall, MD;  Location: [place];  Service: General;  Laterality: Right;   BREAST SURGERY Right 03/14/2016   Biopsy   CAPSULECTOMY Right 09/13/2016   Procedure: RIGHT CAPSULECTOMY;  Surgeon: Irene Limbo, MD;  Location: [place];  Service: Plastics;  Laterality: Right;   CATARACT EXTRACTION, BILATERAL Bilateral 08/10/17 right eye, 08/31/17 left eye   MASS EXCISION Left 02/28/2018   Path:  benign.  Procedure: EXCISIONLEFT MEDIAL THIGH MASS ERAS PATHWAY;  Surgeon: Erroll Luna, MD;  Location: [place];  Service: General;  Laterality: Left;   PORTACATH PLACEMENT N/A 03/15/2016   Procedure: INSERTION PORT-A-CATH WITH [place];  Surgeon: Alphonsa Overall, MD;  [facility];  Service: General;  Laterality: N/A;   PORTACATH REMOVAL  07/2017   surgical repair left ankle Left 2009   s/p [place]   Age 21   US CAROTID DOPPLER BILATERAL ([place] HX)  01/2021   <50% bilat int carotid sten, otherwise normal.    Outpatient Medications Prior to Visit  Medication Sig Dispense Refill   aspirin 81 MG EC tablet Take 1 tablet (81 mg total) by mouth daily. Swallow whole. 30 tablet 12   atorvastatin (LIPITOR) 80 MG tablet Take 1 tablet (80 mg total) by mouth every evening. 90 tablet 3   DULoxetine (CYMBALTA) 30 MG capsule TAKE ONE CAPSULE BY MOUTH DAILY 30 capsule 5   hydrALAZINE (APRESOLINE) 25 MG tablet Take 1 tablet (25 mg total) by mouth 3 (three) times daily. 90 tablet 1   levothyroxine (SYNTHROID) 112 MCG tablet Take 1 tablet (112 mcg total) by mouth daily before breakfast. 90 tablet 3   lisinopril (ZESTRIL) 40 MG tablet Take 1 tablet (40 mg total) by mouth daily. 90 tablet 3   metFORMIN (GLUCOPHAGE-XR) 500 MG 24 hr tablet Take 1 tablet (500 mg total) by mouth in the morning and at bedtime. (  Patient taking differently: Take 1,000 mg by mouth daily before supper.) 180 tablet 3   metoprolol tartrate (LOPRESSOR) 25 MG tablet Take 25 mg by mouth 2 (two) times daily.     OVER THE COUNTER MEDICATION Take 400 mg by mouth 2 (two) times daily with a meal. Palmitoylethanolamide     Propylene Glycol (SYSTANE BALANCE OP) Place 1 drop into both eyes daily as needed (for dry eyes).     sulfamethoxazole-trimethoprim (BACTRIM DS) 800-160 MG tablet Take 1 tablet by mouth 2 (two) times daily. Take for one week. 14 tablet 0   traZODone (DESYREL) 50 MG tablet Take  1 tablet (50 mg total) by mouth at bedtime. (Patient taking differently: Take 50 mg by mouth at bedtime as needed.) 90 tablet 3   UNABLE TO FIND Med Name: PEA supplement     ALPRAZolam (XANAX) 0.25 MG tablet Take 0.5 mg by mouth 2 (two) times daily. (Patient not taking: Reported on 07/15/2021)     cholecalciferol (VITAMIN D3) 25 MCG (1000 UNIT) tablet Take 1,000 Units by mouth daily. (Patient not taking: Reported on 07/14/2021)     diclofenac Sodium (VOLTAREN) 1 % GEL Apply 2 g topically 4 (four) times daily. (Patient not taking: Reported on 07/14/2021) 150 g 0   No facility-administered medications prior to visit.    Allergies  Allergen Reactions   Other Other (See Comments)    STEROIDS- emotional   Prednisone Other (See Comments)    Other reaction(s): Mental Status Changes (intolerance)   Amlodipine     "Side effect"   Hydrochlorothiazide     Elevated sCr   Penicillins Other (See Comments)    Unsure of reaction, was 81 years old    Review of Systems  Constitutional:  Negative for chills and fever.  Eyes:  Negative for blurred vision.  Neurological:  Negative for dizziness and headaches.  As per HPI  PE: Vitals with BMI 07/15/2021 07/14/2021 07/13/2021  Height 5\' 8"  - -  Weight 159 lbs 6 oz 165 lbs 165 lbs  BMI 96.78 - -  Systolic 938 101 751  Diastolic 69 89 80  Pulse 55 86 75     Physical Exam Constitutional:      Appearance: Normal appearance.  Cardiovascular:     Rate and Rhythm: Normal rate and regular rhythm.     Heart sounds: Normal heart sounds.  Pulmonary:     Effort: Pulmonary effort is normal.     Breath sounds: Normal breath sounds.  Neurological:     Mental Status: She is alert.     LABS:  Last CBC Lab Results  Component Value Date   WBC 3.6 (L) 04/20/2021   HGB 12.6 04/20/2021   HCT 37.9 04/20/2021   MCV 98.4 04/20/2021   MCH 32.7 04/20/2021   RDW 12.7 04/20/2021   PLT 163 02/58/5277   Last metabolic panel Lab Results  Component Value Date    GLUCOSE 173 (H) 04/20/2021   NA 138 04/20/2021   K 4.9 04/20/2021   CL 105 04/20/2021   CO2 25 04/20/2021   BUN 14 04/20/2021   CREATININE 0.80 04/20/2021   GFRNONAA >60 04/20/2021   CALCIUM 9.3 04/20/2021   PROT 7.0 04/20/2021   ALBUMIN 4.0 04/20/2021   BILITOT 1.1 04/20/2021   ALKPHOS 63 04/20/2021   AST 19 04/20/2021   ALT 27 04/20/2021   ANIONGAP 8 04/20/2021   IMPRESSION AND PLAN:  Nontoxic appearing female, who presents for follow up for Hypertensive Urgency with blood pressures  ranging from 283-151 systolic and 76-16 diastolic. Patient previously experienced the worst headache of her life, blurry vision, and trouble concentration, but has not experienced these symptoms since starting lopressor 25 mg last night in the ER at [facility].  Also 25mg  lopressor earlier this morning along with 50mg  hydralazine. Blood pressure at home this morning 160 over 70s.  Today in the office 154/69.  Heart rate 55 today.  Mild bradycardia precludes further increase of her Lopressor. Plan is to continue Lopressor 25 mg twice daily and lisinopril 40 mg a day.  She will continue hydralazine at the 50 mg 3 times daily dosing.  However if blood pressure is not less than 130/80 tomorrow then she will increase hydralazine to 75 mg 3 times daily  Of note, Noell does have an appointment on 3/27 for initial evaluation with sleep MD ([place] [facility]) for possible obstructive sleep apnea.  An After Visit Summary was printed and given to the patient.  FOLLOW UP: Return in about 1 week (around 07/22/2021) for virtual visit---f/u bp.  Phil Dopp - MS3  Signed:  Crissie Sickles, MD           07/15/2021

## 2021-07-15 NOTE — Progress Notes (Signed)
See student note for this encounter. I have addended it. Signed:  Crissie Sickles, MD           07/15/2021

## 2021-07-16 LAB — WOUND CULTURE
MICRO NUMBER:: 12974619
SPECIMEN QUALITY:: ADEQUATE

## 2021-07-19 NOTE — Telephone Encounter (Signed)
Add an extra 1/2 of lisinopril tab daily-->(about 8 hours after the whole tab she already takes.  Keep everything else the same for now.  Has she been checking her heart rate.  If so, what is the average?  Send update of bp/symptoms in a couple days. -thx

## 2021-07-19 NOTE — Telephone Encounter (Signed)
Please advise 

## 2021-07-20 ENCOUNTER — Encounter: Payer: Self-pay | Admitting: Internal Medicine

## 2021-07-20 ENCOUNTER — Ambulatory Visit (INDEPENDENT_AMBULATORY_CARE_PROVIDER_SITE_OTHER): Payer: Medicare Other | Admitting: Internal Medicine

## 2021-07-20 VITALS — BP 130/80 | HR 65 | Ht 68.0 in | Wt 160.4 lb

## 2021-07-20 DIAGNOSIS — E039 Hypothyroidism, unspecified: Secondary | ICD-10-CM

## 2021-07-20 DIAGNOSIS — E1142 Type 2 diabetes mellitus with diabetic polyneuropathy: Secondary | ICD-10-CM | POA: Diagnosis not present

## 2021-07-20 LAB — POCT GLUCOSE (DEVICE FOR HOME USE): Glucose Fasting, POC: 245 mg/dL — AB (ref 70–99)

## 2021-07-20 LAB — TSH: TSH: 10.71 u[IU]/mL — ABNORMAL HIGH (ref 0.35–5.50)

## 2021-07-20 LAB — POCT GLYCOSYLATED HEMOGLOBIN (HGB A1C): Hemoglobin A1C: 7.1 % — AB (ref 4.0–5.6)

## 2021-07-20 MED ORDER — METFORMIN HCL ER 500 MG PO TB24: 1000.0000 mg | ORAL_TABLET | Freq: Every day | ORAL | 3 refills | Status: DC

## 2021-07-20 NOTE — Patient Instructions (Signed)
Take Metformin 500 mg, TWO tablets with Supper     You are on levothyroxine - which is your thyroid hormone supplement. You MUST take this consistently.  You should take this first thing in the morning on an empty stomach with water. You should not take it with other medications. Wait 48min to 1hr prior to eating. If you are taking any vitamins - please take these in the evening.   If you miss a dose, please take your missed dose the following day (double the dose for that day). You should have a pill box for ONLY levothyroxine on your bedside table to help you remember to take your medications.

## 2021-07-20 NOTE — Progress Notes (Signed)
Name: Marissa Hall  MRN/ DOB: [id number], 09-Dec-1940   Age/ Sex: 81 y.o., female    PCP: Tammi Sou, MD   Reason for Endocrinology Evaluation: Type 2 Diabetes Mellitus     Date of Initial Endocrinology Visit: 07/20/2021     PATIENT IDENTIFIER: Marissa Hall is a 81 y.o. female with a past medical history of T2DM, Dyslipidemia, Hx of breast Ca, and Hypothyroidism. The patient presented for initial endocrinology clinic visit on 07/20/2021 for consultative assistance with her diabetes management.    HPI: Marissa Hall is transferring care from [place] Dr. Hartford Poli    Diagnosed with DM ~ 2005 Prior Medications tried/Intolerance: just metformin  Currently checking blood sugars 1 x / day Hypoglycemia episodes : no            Hemoglobin A1c has ranged from 7.0% in 2019, peaking at 7.4% in 2021. Patient required assistance for hypoglycemia: no Patient has required hospitalization within the last 1 year from hyper or hypoglycemia: no  In terms of diet, the patient eats 2 meals a day, avoids  sugar-sweetened beverages     She had an admission 04/2021 for syncope      HOME DIABETES REGIMEN: Metformin 500 mg XR , 2 tablet after supper     Statin: yes ACE-I/ARB: yes    METER DOWNLOAD SUMMARY:  Range 150-180 mg/dl     DIABETIC COMPLICATIONS: Microvascular complications:  Neuropathy Denies: CKD, retinopathy Last eye exam: Completed 2022  Macrovascular complications:  Nonischemic cardiomyopathy, TIA Denies: CAD, PVD, CVA   PAST HISTORY: Past Medical History:  Past Medical History:  Diagnosis Date   Acute DVT (deep venous thrombosis) ([place]) 04/23/2019   Right popliteal   Anxiety and depression 1964   oncologist started duloxetine 12/2016   Breast cancer ([place]) 03/14/2016   Clinical stage 2A: (triple neg): Right breast, upper inner quadrant, 03/2016.  Neoadjuvant chemo x 5 cycles,lumpectomy 4 mo later, then RT started 10/2016.  Adjuvant Xeloda [id number].   SWOG research trial pt 04/2017--pt randomized to pembrolizumab immunotherapy.  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  A1c Mar 12, 2018 was 7.0% at Dr. Shirlyn Goltz.    Epidermoid cyst of vulva    Chronic epidermoid cyst of the vulva.  Excision done 02/2019   GERD (gastroesophageal reflux disease) 2013   Hyperlipidemia 1986   Hypertension 2008   2022 ->addition of hctz to lisinopril led to 37ml drop in GFR. HCTZ d/cd.   Hypothyroidism 1988   Diagnosed in her 39s.  Managed by Endocrinologist   Lumbar radiculopathy 04/2019   Dr. Tamala Julian to get plain films of LB and hip (considering MRI due to her hx of cancer)   Nonischemic cardiomyopathy ([place])    Hx of takotsubo CM   Osteoporosis 2015   pt states "osteopenia", but then says that she refused to take the rx med for this condition, so I suspect she had osteoporosis.   Peripheral neuropathy 2017   Patient states diabetic neuropathy in feet prior to starting chemotherapy and then worsened by chemo.    Syncope and collapse    05/2021 while in [place].  No prodrome.  Monitor ordered and cardiology referral ordered 05/14/21   TIA (transient ischemic attack) 03/26/2011   2012: question of (HA + R eye "floaters"). CT in ED neg acute. Not admitted, no f/u testing done.  ?ocular migraine?   Past Surgical History:  Past Surgical History:  Procedure Laterality Date   ABDOMINAL HYSTERECTOMY  1972   APPENDECTOMY  1972   BREAST ENHANCEMENT SURGERY  1982   BREAST IMPLANT REMOVAL Right 09/13/2016   Procedure: REMOVAL RIGHT BREAST IMPLANT;  Surgeon: Irene Limbo, MD;  Location: [place];  Service: Plastics;  Laterality: Right;   BREAST LUMPECTOMY WITH RADIOACTIVE SEED AND SENTINEL LYMPH NODE BIOPSY Right 09/13/2016   Procedure: RIGHT BREAST LUMPECTOMY WITH RADIOACTIVE SEED X 2 AND SENTINEL LYMPH NODE BIOPSY;  Surgeon: Alphonsa Overall, MD;  Location: [place];  Service: General;  Laterality: Right;   BREAST SURGERY Right 03/14/2016   Biopsy   CAPSULECTOMY Right 09/13/2016   Procedure: RIGHT CAPSULECTOMY;  Surgeon: Irene Limbo, MD;  Location: [place];  Service: Plastics;  Laterality: Right;   CATARACT EXTRACTION, BILATERAL Bilateral 08/10/17 right eye, 08/31/17 left eye   MASS EXCISION Left 02/28/2018   Path: benign.  Procedure: EXCISIONLEFT MEDIAL THIGH MASS ERAS PATHWAY;  Surgeon: Erroll Luna, MD;  Location: [place];  Service: General;  Laterality: Left;   PORTACATH PLACEMENT N/A 03/15/2016   Procedure: INSERTION PORT-A-CATH WITH [place];  Surgeon: Alphonsa Overall, MD;  [facility];  Service: General;  Laterality: N/A;   PORTACATH REMOVAL  07/2017   surgical repair left ankle Left 2009   s/p [place]   Age 9   US CAROTID DOPPLER BILATERAL ([place] HX)  01/2021   <50% bilat int carotid sten, otherwise normal.    Social History:  reports that she quit smoking about 22 years ago. Her smoking use included cigarettes. She smoked an average of 1 pack per day. She has never used smokeless tobacco. She reports current alcohol use of about 7.0 standard drinks per week. She reports that she does not use drugs. Family History:  Family History  Problem Relation Age of Onset   Stroke Mother    Suicidality Father    Stroke Brother    Stroke Son      HOME MEDICATIONS: Allergies as of 07/20/2021       Reactions   Other Other (See Comments)   STEROIDS- emotional   Prednisone Other (See Comments)   Other reaction(s): Mental Status Changes (intolerance)   Amlodipine    "Side  effect"   Hydrochlorothiazide    Elevated sCr   Penicillins Other (See Comments)   Unsure of reaction, was 81 years old        Medication List        Accurate as of July 20, 2021  2:55 PM. If you have any questions, ask your nurse or doctor.          ALPRAZolam 0.25 MG tablet Commonly known as: XANAX Take 0.5 mg by mouth 2 (two) times daily.   aspirin 81 MG EC tablet Take 1 tablet (81 mg total) by mouth daily. Swallow whole.   atorvastatin 80 MG tablet Commonly known as: LIPITOR Take 1 tablet (80 mg total) by mouth every evening.   cholecalciferol 25 MCG (1000 UNIT) tablet Commonly known as: VITAMIN D3 Take 1,000 Units by mouth daily.   diclofenac Sodium 1 % Gel Commonly known as: VOLTAREN Apply 2 g topically  4 (four) times daily.   DULoxetine 30 MG capsule Commonly known as: CYMBALTA TAKE ONE CAPSULE BY MOUTH DAILY   hydrALAZINE 25 MG tablet Commonly known as: APRESOLINE Take 1 tablet (25 mg total) by mouth 3 (three) times daily.   levothyroxine 112 MCG tablet Commonly known as: SYNTHROID Take 1 tablet (112 mcg total) by mouth daily before breakfast.   lisinopril 40 MG tablet Commonly known as: ZESTRIL Take 1 tablet (40 mg total) by mouth daily.   metFORMIN 500 MG 24 hr tablet Commonly known as: GLUCOPHAGE-XR Take 2 tablets (1,000 mg total) by mouth daily before supper.   metoprolol tartrate 25 MG tablet Commonly known as: LOPRESSOR Take 25 mg by mouth 2 (two) times daily.   OVER THE COUNTER MEDICATION Take 400 mg by mouth 2 (two) times daily with a meal. Palmitoylethanolamide   sulfamethoxazole-trimethoprim 800-160 MG tablet Commonly known as: Bactrim DS Take 1 tablet by mouth 2 (two) times daily. Take for one week.   SYSTANE BALANCE OP Place 1 drop into both eyes daily as needed (for dry eyes).   traZODone 50 MG tablet Commonly known as: DESYREL Take 1 tablet (50 mg total) by mouth at bedtime. What changed:  when to take this reasons  to take this   [place] Name: PEA supplement         ALLERGIES: Allergies  Allergen Reactions   Other Other (See Comments)    STEROIDS- emotional   Prednisone Other (See Comments)    Other reaction(s): Mental Status Changes (intolerance)   Amlodipine     "Side effect"   Hydrochlorothiazide     Elevated sCr   Penicillins Other (See Comments)    Unsure of reaction, was 81 years old     REVIEW OF SYSTEMS: A comprehensive ROS was conducted with the patient and is negative except as per HPI    OBJECTIVE:   VITAL SIGNS: BP 130/80    Pulse 65    Ht 5\' 8"  (1.727 m)    Wt 160 lb 6.4 oz (72.8 kg)    SpO2 95%    BMI 24.39 kg/m    PHYSICAL EXAM:  General: Pt appears well and is in NAD  Neck: General: Supple without adenopathy or carotid bruits. Thyroid: Thyroid size normal.  No goiter or nodules appreciated.  Lungs: Clear with good BS bilat with no rales, rhonchi, or wheezes  Heart: RRR with normal S1 and S2 and no gallops; no murmurs; no rub  Abdomen: Normoactive bowel sounds, soft, nontender, without masses or organomegaly palpable  Extremities:  Lower extremities - No pretibial edema. No lesions.  Neuro: MS is good with appropriate affect, pt is alert and Ox3    DM foot exam: 07/20/2021  The skin of the feet is intact without sores or ulcerations. The pedal pulses are 2+ on right and 2+ on left. The sensation is intact to a screening 5.07, 10 gram monofilament bilaterally   DATA REVIEWED:  Lab Results  Component Value Date   HGBA1C 7.1 (A) 07/20/2021   HGBA1C 7.2 (A) 02/23/2021   HGBA1C 7.4 03/23/2020   Lab Results  Component Value Date   MICROALBUR 0.8 02/23/2021   CREATININE 0.80 04/20/2021   Lab Results  Component Value Date   MICRALBCREAT 1.0 02/23/2021    Lab Results  Component Value Date   CHOL 311 (H) 02/23/2021   HDL 64.10 02/23/2021   LDLCALC 92 03/23/2020   LDLDIRECT 218.0 02/23/2021   TRIG 242.0 (H) 02/23/2021  CHOLHDL 5  02/23/2021        Latest Reference Range & Units 07/20/21 12:52  Sodium 135 - 145 mEq/L 134 (L)  Potassium 3.5 - 5.1 mEq/L 5.2 (H)  Chloride 96 - 112 mEq/L 100  CO2 19 - 32 mEq/L 25  Glucose 70 - 99 mg/dL 164 (H)  BUN 6 - 23 mg/dL 17  Creatinine 0.40 - 1.20 mg/dL 0.95  Calcium 8.4 - 10.5 mg/dL 10.2  GFR >60.00 mL/min 57.50 (L)    Latest Reference Range & Units 07/20/21 12:52  TSH 0.35 - 5.50 uIU/mL 10.71 (H)  (H): Data is abnormally high    Latest Reference Range & Units 02/23/21 14:51  TSH 0.35 - 5.50 uIU/mL 4.54   In-office BG 245 mg/dL    ASSESSMENT / PLAN / RECOMMENDATIONS:   1) Type 2 Diabetes Mellitus, Optimally controlled, With neuropathic and Macrovascular complications - Most recent A1c of 7.1 %. Goal A1c < 7.5 %.    - A1c at goal  - Her postprandial BG today 245 mg/dL after eating oatmeal , discussed low carb options for breakfast  - I have also advised her to take Metformin with Supper rather then after supper  - No changes today   MEDICATIONS: Metformin 500 mg XR , 2 tabs with Supper   EDUCATION / INSTRUCTIONS: BG monitoring instructions: Patient is instructed to check her blood sugars 1 times a day, fasting . Call [facility] if: BG persistently < 70  I reviewed the Rule of 15 for the treatment of hypoglycemia in detail with the patient. Literature supplied.   2) Diabetic complications:  Eye: Does not have known diabetic retinopathy.  Neuro/ Feet: Does  have known diabetic peripheral neuropathy. Renal: Patient does not have known baseline CKD. She is  on an ACEI/ARB at present.     3) Hypothyroidism :  -- Pt educated extensively on the correct way to take levothyroxine (first thing in the morning with water, 30 minutes before eating or taking other medications). - Pt encouraged to double dose the following day if she were to miss a dose given long half-life of levothyroxine. -TSH is elevated, will increase LT-replacement as  below  Medication  Stop Synthroid 112 mcg daily  Start Synthroid 125 mcg daily   4) Hyperkalemia:  -This is minimal, could be due to lisinopril 40 mg.  I am not going to change this but she will be advised to reduce potassium intake in her food and to monitor electrolyte consumption to liquids  F/U in 6 months   Signed electronically by: Mack Guise, MD  [facility]  [address] [place], [place] [address] Phone: [phone] FAX: [phone]   CC: Tammi Sou, MD [address] [place] [address] Phone: [phone]  Fax: [phone]    Return to Endocrinology clinic as below: Future Appointments  Date Time Provider [place]  08/02/2021  3:00 PM Princess Bruins, MD [facility] None  08/30/2021  1:45 PM Star Age, MD [facility] None  10/29/2021 11:15 AM [facility] None  10/29/2021 11:45 AM Nicholas Lose, MD [facility] None

## 2021-07-21 LAB — BASIC METABOLIC PANEL
BUN: 17 mg/dL (ref 6–23)
CO2: 25 mEq/L (ref 19–32)
Calcium: 10.2 mg/dL (ref 8.4–10.5)
Chloride: 100 mEq/L (ref 96–112)
Creatinine, Ser: 0.95 mg/dL (ref 0.40–1.20)
GFR: 57.5 mL/min — ABNORMAL LOW (ref >60.00)
Glucose, Bld: 164 mg/dL — ABNORMAL HIGH (ref 70–99)
Potassium: 5.2 mEq/L — ABNORMAL HIGH (ref 3.5–5.1)
Sodium: 134 mEq/L — ABNORMAL LOW (ref 135–145)

## 2021-07-22 MED ORDER — SYNTHROID 125 MCG PO TABS: 125.0000 ug | ORAL_TABLET | Freq: Every day | ORAL | 1 refills | Status: DC

## 2021-08-02 ENCOUNTER — Other Ambulatory Visit: Payer: Self-pay

## 2021-08-02 ENCOUNTER — Ambulatory Visit (INDEPENDENT_AMBULATORY_CARE_PROVIDER_SITE_OTHER): Payer: Medicare Other | Admitting: Obstetrics & Gynecology

## 2021-08-02 ENCOUNTER — Encounter: Payer: Self-pay | Admitting: Obstetrics & Gynecology

## 2021-08-02 VITALS — BP 142/72 | HR 55 | Ht 67.5 in | Wt 164.0 lb

## 2021-08-02 DIAGNOSIS — Z780 Asymptomatic menopausal state: Secondary | ICD-10-CM

## 2021-08-02 DIAGNOSIS — C50211 Malignant neoplasm of upper-inner quadrant of right female breast: Secondary | ICD-10-CM

## 2021-08-02 DIAGNOSIS — Z9189 Other specified personal risk factors, not elsewhere classified: Secondary | ICD-10-CM | POA: Diagnosis not present

## 2021-08-02 DIAGNOSIS — Z90710 Acquired absence of both cervix and uterus: Secondary | ICD-10-CM

## 2021-08-02 DIAGNOSIS — M85852 Other specified disorders of bone density and structure, left thigh: Secondary | ICD-10-CM

## 2021-08-02 DIAGNOSIS — Z01419 Encounter for gynecological examination (general) (routine) without abnormal findings: Secondary | ICD-10-CM

## 2021-08-02 DIAGNOSIS — Z171 Estrogen receptor negative status [ER-]: Secondary | ICD-10-CM

## 2021-08-02 NOTE — Progress Notes (Signed)
Marissa Hall 05-Jan-1941 [id number]   History:    81 y.o. G1P1L1    RP: Established patient presenting for annual gyn exam    HPI: Status post total hysterectomy.  Postmenopausal, well on no hormone replacement therapy.  No pelvic pain.  Pap Neg in 2020.  History of right breast cancer.  Mammo Neg 06/2021.  Body mass index 25.31.  Regular physical activities.  BD 01/2019 Osteopenia Lt Femoral Neck T-Score -1.4, all other sites normal.  Will repeat BD next year.  Health labs with family physician.  Recommend Cologuard.  Past medical history,surgical history, family history and social history were all reviewed and documented in the EPIC chart.  Gynecologic History No LMP recorded. Patient has had a hysterectomy.  Obstetric History OB History  Gravida Para Term Preterm AB Living  1 1       1   SAB IAB Ectopic Multiple Live Births               # Outcome Date GA Lbr Len/2nd Weight Sex Delivery Anes PTL Lv  1 Para              ROS: A ROS was performed and pertinent positives and negatives are included in the history.  GENERAL: No fevers or chills. HEENT: No change in vision, no earache, sore throat or sinus congestion. NECK: No pain or stiffness. CARDIOVASCULAR: No chest pain or pressure. No palpitations. PULMONARY: No shortness of breath, cough or wheeze. GASTROINTESTINAL: No abdominal pain, nausea, vomiting or diarrhea, melena or bright red blood per rectum. GENITOURINARY: No urinary frequency, urgency, hesitancy or dysuria. MUSCULOSKELETAL: No joint or muscle pain, no back pain, no recent trauma. DERMATOLOGIC: No rash, no itching, no lesions. ENDOCRINE: No polyuria, polydipsia, no heat or cold intolerance. No recent change in weight. HEMATOLOGICAL: No anemia or easy bruising or bleeding. NEUROLOGIC: No headache, seizures, numbness, tingling or weakness. PSYCHIATRIC: No depression, no loss of interest in normal activity or change in sleep pattern.     Exam:   BP (!) 142/72    Pulse  (!) 55    Ht 5' 7.5" (1.715 m)    Wt 164 lb (74.4 kg)    SpO2 98%    BMI 25.31 kg/m   Body mass index is 25.31 kg/m.  General appearance : Well developed well nourished female. No acute distress HEENT: Eyes: no retinal hemorrhage or exudates,  Neck supple, trachea midline, no carotid bruits, no thyroidmegaly Lungs: Clear to auscultation, no rhonchi or wheezes, or rib retractions  Heart: Regular rate and rhythm, no murmurs or gallops Breast:Examined in sitting and supine position were symmetrical in appearance, no palpable masses or tenderness,  no skin retraction, no nipple inversion, no nipple discharge, no skin discoloration, no axillary or supraclavicular lymphadenopathy Abdomen: no palpable masses or tenderness, no rebound or guarding Extremities: no edema or skin discoloration or tenderness  Pelvic: Vulva: Normal             Vagina: No gross lesions or discharge  Cervix/Uterus absent  Adnexa  Without masses or tenderness  Anus: Normal   Assessment/Plan:  81 y.o. female for annual exam   1. Well female exam with routine gynecological exam Status post total hysterectomy.  Postmenopausal, well on no hormone replacement therapy.  No pelvic pain.  Pap Neg in 2020.  History of right breast cancer.  Mammo Neg 06/2021.  Body mass index 25.31.  Regular physical activities.  BD 01/2019 Osteopenia Lt Femoral Neck T-Score -1.4, all other sites  normal.  Will repeat BD next year.  Health labs with family physician.  Recommend Cologuard.  2. H/O total hysterectomy  3. Postmenopause Status post total hysterectomy.  Postmenopausal, well on no hormone replacement therapy.  No pelvic pain.  4. Osteopenia of neck of left femur BD 01/2019 Osteopenia Lt Femoral Neck T-Score -1.4, all other sites normal.  Will repeat BD next year.   5. Malignant neoplasm of upper-inner quadrant of right breast in female, estrogen receptor negative ([place])  6. Other specified personal risk factors, not elsewhere  classified   Princess Bruins MD, 3:27 PM 08/02/2021

## 2021-08-04 ENCOUNTER — Other Ambulatory Visit: Payer: Self-pay | Admitting: Family Medicine

## 2021-08-18 NOTE — Telephone Encounter (Signed)
Please get her on my schedule tomorrow afternoon, in person is preferred but virtual okay. ?Okay to take 1 hydralazine 25 mg tab 3 times a day instead of 2 tabs.  Please confirm that patient is taking metoprolol 25 mg twice a day, lisinopril 40 mg a day. ?Also have her give what her typical blood pressure has been lately and what her typical heart rate has been as well. ?Will make any med changes prior to tomorrow based on this information. ?Let me know. ?

## 2021-08-19 ENCOUNTER — Telehealth: Payer: Self-pay

## 2021-08-19 ENCOUNTER — Other Ambulatory Visit: Payer: Self-pay

## 2021-08-19 ENCOUNTER — Telehealth (INDEPENDENT_AMBULATORY_CARE_PROVIDER_SITE_OTHER): Payer: Medicare Other | Admitting: Family Medicine

## 2021-08-19 ENCOUNTER — Encounter: Payer: Self-pay | Admitting: Family Medicine

## 2021-08-19 VITALS — BP 163/84 | HR 69 | Wt 160.0 lb

## 2021-08-19 DIAGNOSIS — G4709 Other insomnia: Secondary | ICD-10-CM

## 2021-08-19 DIAGNOSIS — E875 Hyperkalemia: Secondary | ICD-10-CM | POA: Diagnosis not present

## 2021-08-19 DIAGNOSIS — I10 Essential (primary) hypertension: Secondary | ICD-10-CM | POA: Diagnosis not present

## 2021-08-19 DIAGNOSIS — F331 Major depressive disorder, recurrent, moderate: Secondary | ICD-10-CM

## 2021-08-19 MED ORDER — DULOXETINE HCL 30 MG PO CPEP: ORAL_CAPSULE | ORAL | 5 refills | Status: DC

## 2021-08-19 MED ORDER — HYDRALAZINE HCL 25 MG PO TABS: 25.0000 mg | ORAL_TABLET | Freq: Three times a day (TID) | ORAL | 5 refills | Status: DC

## 2021-08-19 MED ORDER — METOPROLOL TARTRATE 25 MG PO TABS: 25.0000 mg | ORAL_TABLET | Freq: Two times a day (BID) | ORAL | 5 refills | Status: DC

## 2021-08-19 MED ORDER — VALSARTAN 320 MG PO TABS: 320.0000 mg | ORAL_TABLET | Freq: Every day | ORAL | 0 refills | Status: DC

## 2021-08-19 NOTE — Progress Notes (Signed)
Virtual Visit via Video Note ? ?I connected with [place] ? on 08/19/21 at  2:40 PM EDT by a video enabled telemedicine application and verified that I am speaking with the correct person using two identifiers. ? Location patient: Marissa Hall ?Location provider:work or home office ?Persons participating in the virtual visit: patient, provider ? ?I discussed the limitations and requested verbal permission for telemedicine visit. The patient expressed understanding and agreed to proceed. ? ?HPI: ?81 y/o female who presents for f/u uncontrolled HTN. ?I last saw her 07/15/2021 and at that time her mild bradycardia precluded further increase of her Lopressor. ?Continued Lopressor 25 mg twice daily and lisinopril 40 mg a day.  She was to continue hydralazine at the 50 mg 3 times daily dosing.  However if blood pressure is not less than 130/80 in 1 to 2 days then she was instructed to increase hydralazine to 75 mg 3 times daily. ? ?Interim hx--> blood pressure still running 423-536 systolic over 14E to 31V diastolic. ?Heart rate 68-89. ?Is only able to tolerate 25 mg hydralazine 3 times daily. ?Taking metoprolol 25 twice daily and lisinopril 40/day. ?Reports a little bit of forgetting to take medications. ? ?Feels more depression lately.  Takes Cymbalta 30 mg a day. ?Has abstained from drinking alcohol and she has starting attending AA. ?She has lots of trouble sleeping and plans on restarting trazodone.  She wants to avoid the alprazolam she has. ?She continues to be concerned by complete exhaustion and getting no feeling of rest after sleep. ?She has evaluation for obstructive sleep apnea on 08/30/2021. ? ?ROS as above, plus--> chronic waxing and waning diarrhea.  Intermittent headaches. ?No fevers, no CP, no SOB, no wheezing, no cough, no dizziness, no rashes, no melena/hematochezia.  No polyuria or polydipsia.  No myalgias or arthralgias.  No focal weakness, paresthesias, or tremors.  No acute vision or hearing abnormalities.  No  dysuria or unusual/new urinary urgency or frequency.  No recent changes in lower legs. ?No n/v/ or abdominal pain.  No palpitations.   ? ? ?Allergies  ?Allergen Reactions  ? Other Other (See Comments)  ?  STEROIDS- emotional  ? Prednisone Other (See Comments)  ?  Other reaction(s): Mental Status Changes (intolerance)  ? Amlodipine   ?  "Side effect"  ? Hydrochlorothiazide   ?  Elevated sCr  ? Penicillins Other (See Comments)  ?  Unsure of reaction, was 81 years old  ? ?-COVID-19 vaccine status:  ?Immunization History  ?Administered Date(s) Administered  ? Fluad Quad(high Dose 65+) 03/13/2019, 05/11/2020, 02/18/2021  ? Influenza, High Dose Seasonal PF 05/03/2017  ? Influenza,inj,Quad PF,6+ Mos 04/11/2016  ? PFIZER(Purple Top)SARS-COV-2 Vaccination 07/21/2019, 08/13/2019  ? Zoster, Live 06/06/2010  ? ? ? ?ROS: See pertinent positives and negatives per HPI. ? ?Past Medical History:  ?Diagnosis Date  ? Acute DVT (deep venous thrombosis) ([place]) 04/23/2019  ? Right popliteal  ? Anxiety and depression 1964  ? oncologist started duloxetine 12/2016  ? Breast cancer ([place]) 03/14/2016  ? Clinical stage 2A: (triple neg): Right breast, upper inner quadrant, 03/2016.  Neoadjuvant chemo x 5 cycles,lumpectomy 4 mo later, then RT started 10/2016.  Adjuvant Xeloda [id number].  SWOG research trial pt 04/2017--pt randomized to pembrolizumab immunotherapy.  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  ? Cataracts, bilateral 07/2017  ? 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 feet; responding well to cymbalta  ? COVID-19 virus infection  05/27/2020  ? Depression 1964  ? Patient states since age 70  ? Diabetes mellitus with complication [facility]) 6378  ? managed by endocrinology.  A1c Mar 12, 2018 was 7.0% at Dr. Shirlyn Goltz.   ? Epidermoid cyst of vulva   ? Chronic epidermoid cyst of the vulva.  Excision done 02/2019  ? GERD (gastroesophageal reflux disease)  2013  ? Hyperlipidemia 1986  ? Hypertension 2008  ? 2022 ->addition of hctz to lisinopril led to 50m drop in GFR. HCTZ d/cd.  ? Hypothyroidism 1988  ? Diagnosed in her 460s  Managed by Endocrinologist  ? Lumbar radiculopathy 04/2019  ? Dr. STamala Julianto get plain films of LB and hip (considering MRI due to her hx of cancer)  ? Nonischemic cardiomyopathy ([place]   ? Hx of takotsubo CM  ? Osteoporosis 2015  ? pt states "osteopenia", but then says that she refused to take the rx med for this condition, so I suspect she had osteoporosis.  ? Peripheral neuropathy 2017  ? Patient states diabetic neuropathy in feet prior to starting chemotherapy and then worsened by chemo.   ? Syncope and collapse   ? [place]  [place].  Monitor ordered and cardiology referral ordered 05/14/21  ? TIA (transient ischemic attack) 03/26/2011  ? 2012: question of (HA + R eye "floaters"). CT in ED neg acute. Not admitted, no f/u testing done.  ?ocular migraine?  ? ? ?Past Surgical History:  ?Procedure Laterality Date  ? ABDOMINAL HYSTERECTOMY  1972  ? [place]EN[place]OMY  1972  ? [place] ? BREAST IMPLANT REMOVAL Right 09/13/2016  ? Procedure: REMOVAL RIGHT BREAST IMPLANT;  Surgeon: BIrene Limbo MD;  [facility]: [facility]  Service: Plastics;  Laterality: Right;  ? BREAST LUMPECTOMY WITH RADIOACTIVE SEED AND SENTINEL LYMPH NODE BIOPSY Right 09/13/2016  ? Procedure: RIGHT BREAST LUMPECTOMY WITH RADIOACTIVE SEED X 2 AND SENTINEL LYMPH NODE BIOPSY;  Surgeon: DAlphonsa Overall MD;  Location: [facility]  Service: General;  Laterality: Right;  ? BREAST SURGERY Right 03/14/2016  ? Biopsy  ? CAPSULECTOMY Right 09/13/2016  ? Procedure: RIGHT CAPSULECTOMY;  Surgeon: BIrene Limbo MD;  Location: [facility]  Service: Plastics;  Laterality: Right;  ? CATARACT EXTRACTION, BILATERAL Bilateral 08/10/17 right eye, 08/31/17 left eye  ? MASS EXCISION Left 02/28/2018  ? Path: benign.   Procedure: EXCISIONLEFT MEDIAL THIGH MASS ERAS PATHWAY;  Surgeon: CErroll Luna MD;  Location: [facility]  Service: General;  Laterality: Left;  ? PORTACATH PLACEMENT N/A 03/15/2016  ? Procedure: INSERTION PORT-A-CATH WITH [place]  Surgeon: DAlphonsa Overall MD;  [facility];  Service: General;  Laterality: N/A;  ? PORTACATH REMOVAL  07/2017  ? surgical repair left ankle Left 2009  ? s/p Fall   ? [place] ? Age 16  ? [place]  01/2021  ? <50% bilat int carotid sten, otherwise normal.  ? ? ? ?Current Outpatient Medications:  ?  aspirin 81 MG EC tablet, Take 1 tablet (81 mg total) by mouth daily. Swallow whole., Disp: 30 tablet, Rfl: 12 ?  atorvastatin (LIPITOR) 80 MG tablet, Take 1 tablet (80 mg total) by mouth every evening., Disp: 90 tablet, Rfl: 3 ?  cholecalciferol (VITAMIN D3) 25 MCG (1000 UNIT) tablet, Take 1,000 Units by mouth daily., Disp: , Rfl:  ?  metFORMIN (GLUCOPHAGE-XR) 500 MG 24 hr tablet, Take 2 tablets (1,000 mg total) by  mouth daily before supper., Disp: 180 tablet, Rfl: 3 ?  Propylene Glycol (SYSTANE BALANCE OP), Place 1 drop into both eyes daily as needed (for dry eyes)., Disp: , Rfl:  ?  SYNTHROID 125 MCG tablet, Take 1 tablet (125 mcg total) by mouth daily before breakfast., Disp: 90 tablet, Rfl: 1 ?  valsartan (DIOVAN) 320 MG tablet, Take 1 tablet (320 mg total) by mouth daily., Disp: 30 tablet, Rfl: 0 ?  diclofenac Sodium (VOLTAREN) 1 % GEL, Apply 2 g topically 4 (four) times daily. (Patient not taking: Reported on 08/19/2021), Disp: 150 g, Rfl: 0 ?  DULoxetine (CYMBALTA) 30 MG capsule, 2 caps po qd, Disp: 30 capsule, Rfl: 5 ?  hydrALAZINE (APRESOLINE) 25 MG tablet, Take 1 tablet (25 mg total) by mouth 3 (three) times daily., Disp: 90 tablet, Rfl: 5 ?  metoprolol tartrate (LOPRESSOR) 25 MG tablet, Take 1 tablet (25 mg total) by mouth 2 (two) times daily., Disp: 60 tablet, Rfl: 5 ?  OVER THE COUNTER MEDICATION, Take 400  mg by mouth 2 (two) times daily with a meal. Palmitoylethanolamide (Patient not taking: Reported on 08/19/2021), Disp: , Rfl:  ?  traZODone (DESYREL) 50 MG tablet, Take 1 tablet (50 mg total) by mouth at

## 2021-08-19 NOTE — Telephone Encounter (Signed)
[  11:32 AM] [facility] ?FYI Marissa Hall  P [facility] Admin Pool (supporting Mychart, Generic) 2 hours ago (9:28AM)  ? ?My last 5 BP numbers 203/86, 202/85, 193/89,179/89,181/87.  These are all morning readings. I forgot to take my medicines yesterday afternoon and evening. ? ? ?Please review and advise ? ?

## 2021-08-27 ENCOUNTER — Telehealth (INDEPENDENT_AMBULATORY_CARE_PROVIDER_SITE_OTHER): Payer: Medicare Other | Admitting: Family Medicine

## 2021-08-27 ENCOUNTER — Encounter: Payer: Self-pay | Admitting: Family Medicine

## 2021-08-27 VITALS — BP 163/91 | HR 60

## 2021-08-27 DIAGNOSIS — I10 Essential (primary) hypertension: Secondary | ICD-10-CM

## 2021-08-27 DIAGNOSIS — F411 Generalized anxiety disorder: Secondary | ICD-10-CM

## 2021-08-27 DIAGNOSIS — F331 Major depressive disorder, recurrent, moderate: Secondary | ICD-10-CM | POA: Diagnosis not present

## 2021-08-27 DIAGNOSIS — F5104 Psychophysiologic insomnia: Secondary | ICD-10-CM

## 2021-08-27 DIAGNOSIS — F1020 Alcohol dependence, uncomplicated: Secondary | ICD-10-CM

## 2021-08-27 MED ORDER — CARVEDILOL 6.25 MG PO TABS: 6.2500 mg | ORAL_TABLET | Freq: Two times a day (BID) | ORAL | 0 refills | Status: DC

## 2021-08-27 NOTE — Progress Notes (Signed)
Virtual Visit via Video Note ? ?I connected with Marissa Hall on 08/27/21 at  8:20 AM EDT by a video enabled telemedicine application and verified that I am speaking with the correct person using two identifiers. ? Location patient: [place] ?Location provider:work or home office ?Persons participating in the virtual visit: patient, provider ? ?I discussed the limitations and requested verbal permission for telemedicine visit. The patient expressed understanding and agreed to proceed. ? ?CC: ?81 y/o female being seen today for 1 week f/u uncontrolled HTN. ?A/P as of last visit: ?"#1 uncontrolled hypertension. ?Discontinue lisinopril and start valsartan 320 mg a day. ?Continue Lopressor 25 mg twice daily and hydralazine 25 mg 3 times daily. ?Plan on increasing Lopressor slowly if blood pressure not controlled in future. ?Has history of borderline hyperkalemia and would like her to come in for be met at sometime soon.  However she has not felt well enough to come in for visits--we will bring this up again at next visit in 1 week. ?  ?#2 recurrent major depressive disorder, active and moderate intensity. ?Increase Cymbalta to 60 mg a day.  She will take 2 of her 30 mg capsules at a time for now. ?  ?#3 insomnia.  Most likely largely related to #2 above. ?She will start trazodone again. ?Avoiding alprazolam. ?  ?#4 alcoholism. ?She recently started attending AA" ? ?INTERIM HX: ?Doing better overall. ?Blood pressures 160s over 80s to 90s average since I last saw her.  No significant headaches or dizziness.  No side effects from the losartan. ? ?Mood is improving just a little, gradually.  She is still attending [place] meetings and looking for one that fits her best and she will then get a sponsor at that time. ?She is not drinking alcohol. ?Since quitting alcohol she does note more periods of increased anxiety and admits she has taken one of her Xanax tabs on a couple of occasions. ?Trazodone is helping well for sleep. ? ? ?Allergies   ?Allergen Reactions  ? Other Other (See Comments)  ?  STEROIDS- emotional  ? Prednisone Other (See Comments)  ?  Other reaction(s): Mental Status Changes (intolerance)  ? Amlodipine   ?  "Side effect"  ? Hydrochlorothiazide   ?  Elevated sCr  ? Penicillins Other (See Comments)  ?  Unsure of reaction, was 81 years old  ? ?-COVID-19 vaccine status:  ?Immunization History  ?Administered Date(s) Administered  ? Fluad Quad(high Dose 65+) 03/13/2019, 05/11/2020, 02/18/2021  ? Influenza, High Dose Seasonal PF 05/03/2017  ? Influenza,inj,Quad PF,6+ Mos 04/11/2016  ? PFIZER(Purple Top)SARS-COV-2 Vaccination 07/21/2019, 08/13/2019  ? Zoster, Live 06/06/2010  ? ? ? ?ROS: See pertinent positives and negatives per HPI. ? ?Past Medical History:  ?Diagnosis Date  ? Acute DVT (deep venous thrombosis) ([place]) 04/23/2019  ? Right popliteal  ? Alcoholism ([place])   ? Anxiety and depression 1964  ? oncologist started duloxetine 12/2016  ? Breast cancer ([place]) 03/14/2016  ? Clinical stage 2A: (triple neg): Right breast, upper inner quadrant, 03/2016.  Neoadjuvant chemo x 5 cycles,lumpectomy 4 mo later, then RT started 10/2016.  Adjuvant Xeloda [id number].  SWOG research trial pt 04/2017--pt randomized to pembrolizumab immunotherapy.  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  ? Cataracts, bilateral 07/2017  ? 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 feet; responding well to cymbalta  ? COVID-19 virus infection 05/27/2020  ? Depression 1964  ? Patient states since  age 27  ? Diabetes mellitus with complication [facility]) 2637  ? managed by endocrinology.  A1c Mar 12, 2018 was 7.0% at Dr. Shirlyn Goltz.   ? Epidermoid cyst of vulva   ? Chronic epidermoid cyst of the vulva.  Excision done 02/2019  ? GERD (gastroesophageal reflux disease) 2013  ? Hyperlipidemia 1986  ? Hypertension 2008  ? 2022 ->addition of hctz to lisinopril led to 56m drop in GFR. HCTZ d/cd.  ?  Hypothyroidism 1988  ? Diagnosed in her 459s  Managed by Endocrinologist  ? Lumbar radiculopathy 04/2019  ? Dr. STamala Julianto get plain films of LB and hip (considering MRI due to her hx of cancer)  ? Nonischemic cardiomyopathy ([place]   ? Hx of takotsubo CM  ? Osteoporosis 2015  ? pt states "osteopenia", but then says that she refused to take the rx med for this condition, so I suspect she had osteoporosis.  ? Peripheral neuropathy 2017  ? Patient states diabetic neuropathy in feet prior to starting chemotherapy and then worsened by chemo.   ? Syncope and collapse   ? [place]  [place] prod[place].  Monitor ordered and cardiology referral ordered 05/14/21  ? TIA (transient ischemic attack) 03/26/2011  ? 2012: question of (HA + R eye "floaters"). CT in ED neg acute. Not admitted, no f/u testing done.  ?ocular migraine?  ? ? ?Past Surgical History:  ?Procedure Laterality Date  ? ABDOMINAL HYSTERECTOMY  1972  ? APPENDECTOMY  1972  ? [place] ? BREAST IMPLANT REMOVAL Right 09/13/2016  ? Procedure: REMOVAL RIGHT BREAST IMPLANT;  Surgeon: BIrene Limbo MD;  [facility]  Service: Plastics;  Laterality: Right;  ? BREAST LUMPECTOMY WITH RADIOACTIVE SEED AND SENTINEL LYMPH NODE BIOPSY Right 09/13/2016  ? Procedure: RIGHT BREAST LUMPECTOMY WITH RADIOACTIVE SEED X 2 AND SENTINEL LYMPH NODE BIOPSY;  Surgeon: DAlphonsa Overall MD;  [facility]: [facility]  Service: General;  Laterality: Right;  ? BREAST SURGERY Right 03/14/2016  ? Biopsy  ? CAPSULECTOMY Right 09/13/2016  ? Procedure: RIGHT CAPSULECTOMY;  Surgeon: BIrene Limbo MD;  [facility]  Service: Plastics;  Laterality: Right;  ? CATARACT EXTRACTION, BILATERAL Bilateral 08/10/17 right eye, 08/31/17 left eye  ? MASS EXCISION Left 02/28/2018  ? Path: benign.  Procedure: EXCISIONLEFT MEDIAL THIGH MASS ERAS PATHWAY;  Surgeon: CErroll Luna MD;  [facility]: [facility]   Service: General;  Laterality: Left;  ? PORTACATH PLACEMENT N/A 03/15/2016  ? Procedure: INSERTION PORT-A-CATH WITH [place]  Surgeon: DAlphonsa Overall MD;  [facility];  Service: General;  Laterality: N/A;  ? PORTACATH REMOVAL  07/2017  ? surgical repair left ankle Left 2009  ? s/p Fall   ? [place] ? Age 81  ? [place]  01/2021  ? <50% bilat int carotid sten, otherwise normal.  ? ? ? ?Current Outpatient Medications:  ?  aspirin 81 MG EC tablet, Take 1 tablet (81 mg total) by mouth daily. Swallow whole., Disp: 30 tablet, Rfl: 12 ?  atorvastatin (LIPITOR) 80 MG tablet, Take 1 tablet (80 mg total) by mouth every evening., Disp: 90 tablet, Rfl: 3 ?  cholecalciferol (VITAMIN D3) 25 MCG (1000 UNIT) tablet, Take 1,000 Units by mouth daily., Disp: , Rfl:  ?  diclofenac Sodium (VOLTAREN) 1 % GEL, Apply 2 g topically 4 (four) times daily. (Patient not taking: Reported on 08/19/2021), Disp: 150 g, Rfl:  0 ?  DULoxetine (CYMBALTA) 30 MG capsule, 2 caps po qd, Disp: 30 capsule, Rfl: 5 ?  hydrALAZINE (APRESOLINE) 25 MG tablet, Take 1 tablet (25 mg total) by mouth 3 (three) times daily., Disp: 90 tablet, Rfl: 5 ?  metFORMIN (GLUCOPHAGE-XR) 500 MG 24 hr tablet, Take 2 tablets (1,000 mg total) by mouth daily before supper., Disp: 180 tablet, Rfl: 3 ?  metoprolol tartrate (LOPRESSOR) 25 MG tablet, Take 1 tablet (25 mg total) by mouth 2 (two) times daily., Disp: 60 tablet, Rfl: 5 ?  OVER THE COUNTER MEDICATION, Take 400 mg by mouth 2 (two) times daily with a meal. Palmitoylethanolamide (Patient not taking: Reported on 08/19/2021), Disp: , Rfl:  ?  Propylene Glycol (SYSTANE BALANCE OP), Place 1 drop into both eyes daily as needed (for dry eyes)., Disp: , Rfl:  ?  SYNTHROID 125 MCG tablet, Take 1 tablet (125 mcg total) by mouth daily before breakfast., Disp: 90 tablet, Rfl: 1 ?  traZODone (DESYREL) 50 MG tablet, Take 1 tablet (50 mg total) by mouth at bedtime. (Patient not taking:  Reported on 08/19/2021), Disp: 90 tablet, Rfl: 3 ?  valsartan (DIOVAN) 320 MG tablet, Take 1 tablet (320 mg total) by mouth daily., Disp: 30 tablet, Rfl: 0 ? ?EXAM: ? ?VITALS per patient if applicable:  ? ?

## 2021-08-30 ENCOUNTER — Ambulatory Visit (INDEPENDENT_AMBULATORY_CARE_PROVIDER_SITE_OTHER): Payer: Medicare Other | Admitting: Neurology

## 2021-08-30 ENCOUNTER — Encounter: Payer: Self-pay | Admitting: Neurology

## 2021-08-30 VITALS — BP 134/74 | HR 56 | Ht 68.0 in | Wt 164.0 lb

## 2021-08-30 DIAGNOSIS — G4719 Other hypersomnia: Secondary | ICD-10-CM

## 2021-08-30 DIAGNOSIS — I428 Other cardiomyopathies: Secondary | ICD-10-CM

## 2021-08-30 DIAGNOSIS — R351 Nocturia: Secondary | ICD-10-CM | POA: Diagnosis not present

## 2021-08-30 DIAGNOSIS — R519 Headache, unspecified: Secondary | ICD-10-CM

## 2021-08-30 DIAGNOSIS — I10 Essential (primary) hypertension: Secondary | ICD-10-CM | POA: Diagnosis not present

## 2021-08-30 DIAGNOSIS — Z820 Family history of epilepsy and other diseases of the nervous system: Secondary | ICD-10-CM

## 2021-08-30 DIAGNOSIS — R0683 Snoring: Secondary | ICD-10-CM | POA: Diagnosis not present

## 2021-08-30 DIAGNOSIS — G4700 Insomnia, unspecified: Secondary | ICD-10-CM

## 2021-08-30 NOTE — Progress Notes (Signed)
Subjective:  ?  ?Patient ID: Trudy Kory is a 81 y.o. female. ? ?HPI ? ? ? ?Star Age, MD, PhD ?[facility] ?[address], [address] ?[phone] ?[place], [place] [address] ? ?Dear Dr. Anitra Lauth, ? ?I saw your patient, Tanesia Butner, upon your kind request in my sleep clinic today for initial consultation of her sleep disorder, in particular, concern for underlying obstructive sleep apnea.  The patient is unaccompanied today.  As you know, Ms. Loser is a 81 year old right-handed woman with an underlying complex medical history of breast cancer, status post lumpectomy, status post chemotherapy, neuropathy, hypertension, hyperlipidemia, hypothyroidism, lumbar radiculopathy, history of DVT, alcohol use disorder (by chart review and per pt report), anxiety, depression, syncope, nonischemic cardiomyopathy, osteoporosis, remote history of TIA (per chart review), reflux disease, diabetes, and cataracts, who reports sleep disruption, nonrestorative sleep, daytime tiredness and difficult to control blood pressure.  She is not sure if she snores.  She lives alone.  She reports a family history of sleep apnea as her son has a CPAP machine.  She tries to go to bed around 10 or 11 and rise time is around 6:30 AM, sometimes between 7:30 AM and 8 AM.  She works part-time as an Optometrist.  She tosses and turns, she denies any telltale symptoms of restless leg syndrome.  She limits her caffeine to coffee in the morning, 1 cup and 1 bottle of tea per day.  She has a history of excessive alcohol use but currently limits herself to drinking up to 2 glasses of wine on the weekend.  She does not drink alcohol daily any longer.  She has had a recent increase in her Cymbalta.  She had a change in her blood pressure medication.  She quit smoking over 25 years ago.  She does not take trazodone every night, she has a 50 mg prescription.  She tried melatonin several years ago and did not find it helpful.  She  has nocturia about once per average night and does have recurrent morning headaches.  She also reports that her blood pressure tends to be up in the mornings. I reviewed your office note from 06/16/2021.  Her Epworth sleepiness score is 11/24, fatigue severity score is 53/63. She has chronic difficulty maintaining sleep.  She has 3 dogs and 3 cats in the household. ? ? ?Her Past Medical History Is Significant For: ?Past Medical History:  ?Diagnosis Date  ? Acute DVT (deep venous thrombosis) ([place]) 04/23/2019  ? Right popliteal  ? Alcoholism ([place])   ? Anxiety and depression 1964  ? oncologist started duloxetine 12/2016  ? Breast cancer ([place]) 03/14/2016  ? Clinical stage 2A: (triple neg): Right breast, upper inner quadrant, 03/2016.  Neoadjuvant chemo x 5 cycles,lumpectomy 4 mo later, then RT started 10/2016.  Adjuvant Xeloda [id number].  SWOG research trial pt 04/2017--pt randomized to pembrolizumab immunotherapy.  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  ? Cataracts, bilateral 07/2017  ? 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 feet; responding well to cymbalta  ? COVID-19 virus infection 05/27/2020  ? Depression 1964  ? Patient states since age 38  ? Diabetes mellitus with complication [facility]) 8338  ? managed by endocrinology.  A1c Mar 12, 2018 was 7.0% at Dr. Shirlyn Goltz.   ? Epidermoid cyst of vulva   ? Chronic epidermoid cyst of the vulva.  Excision done 02/2019  ? GERD (gastroesophageal reflux  disease) 2013  ? Hyperlipidemia 1986  ? Hypertension 2008  ? 2022 ->addition of hctz to lisinopril led to 76m drop in GFR. HCTZ d/cd.  ? Hypothyroidism 1988  ? Diagnosed in her 442s  Managed by Endocrinologist  ? Lumbar radiculopathy 04/2019  ? Dr. STamala Julianto get plain films of LB and hip (considering MRI due to her hx of cancer)  ? Nonischemic cardiomyopathy ([place]   ? Hx of takotsubo CM  ? Osteoporosis 2015  ? pt states "osteopenia",  but then says that she refused to take the rx med for this condition, so I suspect she had osteoporosis.  ? Peripheral neuropathy 2017  ? Patient states diabetic neuropathy in feet prior to starting chemotherapy and then worsened by chemo.   ? Syncope and collapse   ? [place]  No prodrome.  Monitor ordered and cardiology referral ordered 05/14/21  ? TIA (transient ischemic attack) 03/26/2011  ? 2012: question of (HA + R eye "floaters"). CT in ED neg acute. Not admitted, no f/u testing done.  ?ocular migraine?  ? ? ?Her Past Surgical History Is Significant For: ?Past Surgical History:  ?Procedure Laterality Date  ? ABDOMINAL HYSTERECTOMY  1972  ? APPENDECTOMY  1972  ? [place] ? BREAST IMPLANT REMOVAL Right 09/13/2016  ? Procedure: REMOVAL RIGHT BREAST IMPLANT;  Surgeon: BIrene Limbo MD;  [facility]  Service: Plastics;  Laterality: Right;  ? BREAST LUMPECTOMY WITH RADIOACTIVE SEED AND SENTINEL LYMPH NODE BIOPSY Right 09/13/2016  ? Procedure: RIGHT BREAST LUMPECTOMY WITH RADIOACTIVE SEED X 2 AND SENTINEL LYMPH NODE BIOPSY;  Surgeon: DAlphonsa Overall MD;  [facility]  Service: General;  Laterality: Right;  ? BREAST SURGERY Right 03/14/2016  ? Biopsy  ? CAPSULECTOMY Right 09/13/2016  ? Procedure: RIGHT CAPSULECTOMY;  Surgeon: BIrene Limbo MD;  [facility]  Service: Plastics;  Laterality: Right;  ? CATARACT EXTRACTION, BILATERAL Bilateral 08/10/17 right eye, 08/31/17 left eye  ? MASS EXCISION Left 02/28/2018  ? Path: benign.  Procedure: EXCISIONLEFT MEDIAL THIGH MASS ERAS PATHWAY;  Surgeon: CErroll Luna MD;  [facility]  Service: General;  Laterality: Left;  ? PORTACATH PLACEMENT N/A 03/15/2016  ? Procedure: INSERTION PORT-A-CATH WITH [place]  Surgeon: DAlphonsa Overall MD;  [facility];  Service: General;  Laterality: N/A;  ? PORTACATH REMOVAL  07/2017  ? surgical repair  left ankle Left 2009  ? s/p Fall   ? [place] ? Age 9  ? [place]ID DOPPLER BILATERAL ([place]  01/2021  ? <50% bilat int carotid sten, otherwise normal.  ? ? ?Her Family History Is Significant For: ?Family History  ?Problem Relation Age of Onset  ? Stroke Mother   ? Suicidality Father   ? Stroke Brother   ? Stroke Son   ? Sleep apnea Son   ? ? ?Her Social History Is Significant For: ?Social History  ? ?Socioeconomic History  ? Marital status: Divorced  ?  Spouse name: Not on file  ? Number of children: Not on file  ? Years of education: Not on file  ? Highest education level: Not on file  ?Occupational History  ? Not on file  ?Tobacco Use  ? Smoking status: Former  ?  Packs/day: 1.00  ?  Types: Cigarettes  ?  Quit date: 03/08/1999  ?  Years since quitting: 22.4  ? Smokeless tobacco: Never  ?Vaping Use  ?  Vaping Use: Never used  ?Substance and Sexual Activity  ? Alcohol use: Yes  ?  Alcohol/week: 7.0 standard drinks  ?  Types: 7 Shots of liquor per week  ?  Comment:  daily  ? Drug use: No  ? Sexual activity: Not Currently  ?  Partners: Male  ?  Birth control/protection: Surgical  ?  Comment: 1st intercourse- 19, partners- 10+, current partner- 8 yrs, hysterectomy  ?Other Topics Concern  ? Not on file  ?Social History Narrative  ? Divorced, has a son who lives in [place].  ? Educ: college  ? Occup: accountant, still working part time.  ? Tob: quit 2000.    ? Alc: several times a week--1-2 glasses wine.  ? No drugs.  ? ?Social Determinants of Health  ? ?Financial Resource Strain: Not on file  ?Food Insecurity: Not on file  ?Transportation Needs: Not on file  ?Physical Activity: Not on file  ?Stress: Not on file  ?Social Connections: Not on file  ? ? ?Her Allergies Are:  ?Allergies  ?Allergen Reactions  ? Other Other (See Comments)  ?  STEROIDS- emotional  ? Prednisone Other (See Comments)  ?  Other reaction(s): Mental Status Changes (intolerance)  ? Amlodipine   ?  "Side effect"  ?  Hydrochlorothiazide   ?  Elevated sCr  ? Penicillins Other (See Comments)  ?  Unsure of reaction, was 81 years old  ?:  ? ?Her Current Medications Are:  ?Outpatient Encounter Medications as of 08/30/2021  ?Medication

## 2021-09-01 ENCOUNTER — Other Ambulatory Visit: Payer: Self-pay

## 2021-09-01 ENCOUNTER — Ambulatory Visit (INDEPENDENT_AMBULATORY_CARE_PROVIDER_SITE_OTHER): Payer: Medicare Other

## 2021-09-01 DIAGNOSIS — Z0000 Encounter for general adult medical examination without abnormal findings: Secondary | ICD-10-CM | POA: Diagnosis not present

## 2021-09-01 NOTE — Patient Instructions (Signed)
Ms. Vandoren , ?Thank you for taking time to come for your Medicare Wellness Visit. I appreciate your ongoing commitment to your health goals. Please review the following plan we discussed and let me know if I can assist you in the future.  ? ?Screening recommendations/referrals: ?Colonoscopy: No longer required  ?Mammogram: Done 06/08/21 repeat every year  ?Bone Density: Done 01/30/19 repeat every 2 years  ?Recommended yearly ophthalmology/optometry visit for glaucoma screening and checkup ?Recommended yearly dental visit for hygiene and checkup ? ?Vaccinations: ?Influenza vaccine: Done 02/18/21 repeat every year  ?Pneumococcal vaccine: Declined and discussed  ?Tdap vaccine: due  ?Shingles vaccine: Shingrix discussed. Please contact your pharmacy for coverage information.    ?Covid-19:Completed 2/14, 08/13/19 ? ?Advanced directives: Please bring a copy of your health care power of attorney and living will to the office at your convenience. ? ?Conditions/risks identified: Get blood pressure under control ? ?Next appointment: Follow up in one year for your annual wellness visit  ? ? ?Preventive Care 79 Years and Older, Female ?Preventive care refers to lifestyle choices and visits with your health care provider that can promote health and wellness. ?What does preventive care include? ?A yearly physical exam. This is also called an annual well check. ?Dental exams once or twice a year. ?Routine eye exams. Ask your health care provider how often you should have your eyes checked. ?Personal lifestyle choices, including: ?Daily care of your teeth and gums. ?Regular physical activity. ?Eating a healthy diet. ?Avoiding tobacco and drug use. ?Limiting alcohol use. ?Practicing safe sex. ?Taking low-dose aspirin every day. ?Taking vitamin and mineral supplements as recommended by your health care provider. ?What happens during an annual well check? ?The services and screenings done by your health care provider during your annual  well check will depend on your age, overall health, lifestyle risk factors, and family history of disease. ?Counseling  ?Your health care provider may ask you questions about your: ?Alcohol use. ?Tobacco use. ?Drug use. ?Emotional well-being. ?Home and relationship well-being. ?Sexual activity. ?Eating habits. ?History of falls. ?Memory and ability to understand (cognition). ?Work and work Statistician. ?Reproductive health. ?Screening  ?You may have the following tests or measurements: ?Height, weight, and BMI. ?Blood pressure. ?Lipid and cholesterol levels. These may be checked every 5 years, or more frequently if you are over 69 years old. ?Skin check. ?Lung cancer screening. You may have this screening every year starting at age 79 if you have a 30-pack-year history of smoking and currently smoke or have quit within the past 15 years. ?Fecal occult blood test (FOBT) of the stool. You may have this test every year starting at age 79. ?Flexible sigmoidoscopy or colonoscopy. You may have a sigmoidoscopy every 5 years or a colonoscopy every 10 years starting at age 79. ?Hepatitis C blood test. ?Hepatitis B blood test. ?Sexually transmitted disease (STD) testing. ?Diabetes screening. This is done by checking your blood sugar (glucose) after you have not eaten for a while (fasting). You may have this done every 1-3 years. ?Bone density scan. This is done to screen for osteoporosis. You may have this done starting at age 79. ?Mammogram. This may be done every 1-2 years. Talk to your health care provider about how often you should have regular mammograms. ?Talk with your health care provider about your test results, treatment options, and if necessary, the need for more tests. ?Vaccines  ?Your health care provider may recommend certain vaccines, such as: ?Influenza vaccine. This is recommended every year. ?Tetanus, diphtheria, and acellular  pertussis (Tdap, Td) vaccine. You may need a Td booster every 10 years. ?Zoster  vaccine. You may need this after age 79. ?Pneumococcal 13-valent conjugate (PCV13) vaccine. One dose is recommended after age 79. ?Pneumococcal polysaccharide (PPSV23) vaccine. One dose is recommended after age 79. ?Talk to your health care provider about which screenings and vaccines you need and how often you need them. ?This information is not intended to replace advice given to you by your health care provider. Make sure you discuss any questions you have with your health care provider. ?Document Released: 06/19/2015 Document Revised: 02/10/2016 Document Reviewed: 03/24/2015 ?Elsevier Interactive Patient Education ? 2017 [place]. ? ?Fall Prevention in the Home ?Falls can cause injuries. They can happen to people of all ages. There are many things you can do to make your home safe and to help prevent falls. ?What can I do on the outside of my home? ?Regularly fix the edges of walkways and driveways and fix any cracks. ?Remove anything that might make you trip as you walk through a door, such as a raised step or threshold. ?Trim any bushes or trees on the path to your home. ?Use bright outdoor lighting. ?Clear any walking paths of anything that might make someone trip, such as rocks or tools. ?Regularly check to see if handrails are loose or broken. Make sure that both sides of any steps have handrails. ?Any raised decks and porches should have guardrails on the edges. ?Have any leaves, snow, or ice cleared regularly. ?Use sand or salt on walking paths during winter. ?Clean up any spills in your garage right away. This includes oil or grease spills. ?What can I do in the bathroom? ?Use night lights. ?Install grab bars by the toilet and in the tub and shower. Do not use towel bars as grab bars. ?Use non-skid mats or decals in the tub or shower. ?If you need to sit down in the shower, use a plastic, non-slip stool. ?Keep the floor dry. Clean up any water that spills on the floor as soon as it happens. ?Remove  soap buildup in the tub or shower regularly. ?Attach bath mats securely with double-sided non-slip rug tape. ?Do not have throw rugs and other things on the floor that can make you trip. ?What can I do in the bedroom? ?Use night lights. ?Make sure that you have a light by your bed that is easy to reach. ?Do not use any sheets or blankets that are too big for your bed. They should not hang down onto the floor. ?Have a firm chair that has side arms. You can use this for support while you get dressed. ?Do not have throw rugs and other things on the floor that can make you trip. ?What can I do in the kitchen? ?Clean up any spills right away. ?Avoid walking on wet floors. ?Keep items that you use a lot in easy-to-reach places. ?If you need to reach something above you, use a strong step stool that has a grab bar. ?Keep electrical cords out of the way. ?Do not use floor polish or wax that makes floors slippery. If you must use wax, use non-skid floor wax. ?Do not have throw rugs and other things on the floor that can make you trip. ?What can I do with my stairs? ?Do not leave any items on the stairs. ?Make sure that there are handrails on both sides of the stairs and use them. Fix handrails that are broken or loose. Make sure that handrails  are as long as the stairways. ?Check any carpeting to make sure that it is firmly attached to the stairs. Fix any carpet that is loose or worn. ?Avoid having throw rugs at the top or bottom of the stairs. If you do have throw rugs, attach them to the floor with carpet tape. ?Make sure that you have a light switch at the top of the stairs and the bottom of the stairs. If you do not have them, ask someone to add them for you. ?What else can I do to help prevent falls? ?Wear shoes that: ?Do not have high heels. ?Have rubber bottoms. ?Are comfortable and fit you well. ?Are closed at the toe. Do not wear sandals. ?If you use a stepladder: ?Make sure that it is fully opened. Do not climb a  closed stepladder. ?Make sure that both sides of the stepladder are locked into place. ?Ask someone to hold it for you, if possible. ?Clearly mark and make sure that you can see: ?Any grab bars or handrail

## 2021-09-01 NOTE — Progress Notes (Signed)
Virtual Visit via Telephone Note ? ?I connected with  Marissa Hall on 09/01/21 at  8:30 AM EDT by telephone and verified that I am speaking with the correct person using two identifiers. ? ?Medicare Annual Wellness visit completed telephonically due to Covid-19 pandemic.  ? ?Persons participating in this call: This Health Coach and this patient.  ? ?Location: ?Patient: Home ?Provider: Office ?  ?I discussed the limitations, risks, security and privacy concerns of performing an evaluation and management service by telephone and the availability of in person appointments. The patient expressed understanding and agreed to proceed. ? ?Unable to perform video visit due to video visit attempted and failed and/or patient does not have video capability.  ? ?Some vital signs may be absent or patient reported.  ? ?Willette Brace, LPN ? ? ?Subjective:  ? Marissa Hall is a 81 y.o. female who presents for an Initial Medicare Annual Wellness Visit. ? ?Review of Systems    ? ?Cardiac Risk Factors include: advanced age (>10mn, >>81women);hypertension;diabetes mellitus ? ?   ?Objective:  ?  ?There were no vitals filed for this visit. ?There is no height or weight on file to calculate BMI. ? ? ?  09/01/2021  ?  8:39 AM 02/02/2021  ?  2:03 PM 11/14/2019  ?  8:03 PM 06/04/2019  ?  4:26 PM 07/30/2018  ?  4:13 PM 02/28/2018  ?  8:34 AM 02/27/2018  ? 11:53 AM  ?Advanced Directives  ?Does Patient Have a Medical Advance Directive? Yes Yes No Yes Yes Yes Yes  ?Type of [place]  Living will;Healthcare Power of Attorney Living will;Healthcare Power of Attorney Living will Living will;Healthcare Power of Attorney  ?Does patient want to make changes to medical advance directive?  No - Patient declined  No - Patient declined No - Patient declined No - Patient declined No - Patient declined  ?Copy of HEspinoin Chart? No - copy requested No -  copy requested    No - copy requested No - copy requested  ?Would patient like information on creating a medical advance directive?   No - Patient declined      ? ? ?Current Medications (verified) ?Outpatient Encounter Medications as of 09/01/2021  ?Medication Sig  ? atorvastatin (LIPITOR) 80 MG tablet Take 1 tablet (80 mg total) by mouth every evening.  ? carvedilol (COREG) 6.25 MG tablet Take 1 tablet (6.25 mg total) by mouth 2 (two) times daily with a meal.  ? cholecalciferol (VITAMIN D3) 25 MCG (1000 UNIT) tablet Take 1,000 Units by mouth daily.  ? diclofenac Sodium (VOLTAREN) 1 % GEL Apply 2 g topically 4 (four) times daily. (Patient taking differently: Apply 2 g topically as needed.)  ? DULoxetine (CYMBALTA) 30 MG capsule 2 caps po qd  ? hydrALAZINE (APRESOLINE) 25 MG tablet Take 1 tablet (25 mg total) by mouth 3 (three) times daily.  ? metFORMIN (GLUCOPHAGE-XR) 500 MG 24 hr tablet Take 2 tablets (1,000 mg total) by mouth daily before supper.  ? OVER THE COUNTER MEDICATION Take 400 mg by mouth 2 (two) times daily with a meal. Palmitoylethanolamide  ? Propylene Glycol (SYSTANE BALANCE OP) Place 1 drop into both eyes daily as needed (for dry eyes).  ? SYNTHROID 125 MCG tablet Take 1 tablet (125 mcg total) by mouth daily before breakfast.  ? traZODone (DESYREL) 50 MG tablet Take 1 tablet (50 mg total) by mouth at bedtime.  ?  valsartan (DIOVAN) 320 MG tablet Take 1 tablet (320 mg total) by mouth daily.  ? [DISCONTINUED] aspirin 81 MG EC tablet Take 1 tablet (81 mg total) by mouth daily. Swallow whole.  ? ?No facility-administered encounter medications on file as of 09/01/2021.  ? ? ?Allergies (verified) ?Other, Prednisone, Amlodipine, Hydrochlorothiazide, and Penicillins  ? ?History: ?Past Medical History:  ?Diagnosis Date  ? Acute DVT (deep venous thrombosis) ([place]) 04/23/2019  ? Right popliteal  ? Alcoholism ([place])   ? Anxiety and depression 1964  ? oncologist started duloxetine 12/2016  ? Breast cancer ([place])  03/14/2016  ? Clinical stage 2A: (triple neg): Right breast, upper inner quadrant, 03/2016.  Neoadjuvant chemo x 5 cycles,lumpectomy 4 mo later, then RT started 10/2016.  Adjuvant Xeloda [id number].  SWOG research trial pt 04/2017--pt randomized to pembrolizumab immunotherapy.  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  ? Cataracts, bilateral 07/2017  ? 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 feet; responding well to cymbalta  ? COVID-19 virus infection 05/27/2020  ? Depression 1964  ? Patient states since age 74  ? Diabetes mellitus with complication [facility]) [id number]  ? managed by endocrinology.  A1c Mar 12, 2018 was 7.0% at Dr. Shirlyn Goltz.   ? Epidermoid cyst of vulva   ? Chronic epidermoid cyst of the vulva.  Excision done 02/2019  ? GERD (gastroesophageal reflux disease) 2013  ? Hyperlipidemia 1986  ? Hypertension 2008  ? 2022 ->addition of hctz to lisinopril led to 13m drop in GFR. HCTZ d/cd.  ? Hypothyroidism 1988  ? Diagnosed in her 442s  Managed by Endocrinologist  ? Lumbar radiculopathy 04/2019  ? Dr. STamala Julianto get plain films of LB and hip (considering MRI due to her hx of cancer)  ? [place]ischemic cardiomyopathy ([place]   ? Hx of takotsubo CM  ? Osteoporosis 2015  ? pt states "osteopenia", but then says that she refused to take the rx med for this condition, so I suspect she had osteoporosis.  ? Peripheral neuropathy 2017  ? Patient states diabetic neuropathy in feet prior to starting chemotherapy and then worsened by chemo.   ? Syncope and collapse   ? [place]  [place].  Monitor ordered and cardiology referral ordered 05/14/21  ? TIA (transient ischemic attack) 03/26/2011  ? 2012: question of (HA + R eye "floaters"). CT in ED neg acute. Not admitted, no f/u testing done.  ?ocular migraine?  ? ?Past Surgical History:  ?Procedure Laterality Date  ? ABDOMINAL HYSTERECTOMY  1972  ? APPENDECTOMY  1972  ?  [place] ? BREAST IMPLANT REMOVAL Right 09/13/2016  ? Procedure: REMOVAL RIGHT BREAST IMPLANT;  Surgeon: BIrene Limbo MD;  [place]  Service: Plastics;  Laterality: Right;  ? BREAST LUMPECTOMY WITH RADIOACTIVE SEED AND SENTINEL LYMPH NODE BIOPSY Right 09/13/2016  ? Procedure: RIGHT BREAST LUMPECTOMY WITH RADIOACTIVE SEED X 2 AND SENTINEL LYMPH NODE BIOPSY;  Surgeon: DAlphonsa Overall MD;  [place] [facility]  Service: General;  Laterality: Right;  ? BREAST SURGERY Right 03/14/2016  ? Biopsy  ? CAPSULECTOMY Right 09/13/2016  ? Procedure: RIGHT CAPSULECTOMY;  Surgeon: BIrene Limbo MD;  [place] [facility]  Service: Plastics;  Laterality: Right;  ? CATARACT EXTRACTION, BILATERAL Bilateral 08/10/17 right eye, 08/31/17 left eye  ? MASS EXCISION Left 02/28/2018  ? Path: benign.  Procedure: EXCISIONLEFT MEDIAL THIGH  MASS ERAS PATHWAY;  Surgeon: Erroll Luna, MD;  Location: [place];  Service: General;  Laterality: Left;  ? PORTACATH PLACEMENT N/A 03/15/2016  ? Procedure: INSERTION PORT-A-CATH WITH [place];  Surgeon: Alphonsa Overall, MD;  Location: [facility];  Service: General;  Laterality: N/A;  ? PORTACATH REMOVAL  07/2017  ? surgical repair left ankle Left 2009  ? s/p Fall   ? [place]  ? Age 52  ? US CAROTID DOPPLER BILATERAL ([place] HX)  01/2021  ? <50% bilat int carotid sten, otherwise normal.  ? ?Family History  ?Problem Relation Age of Onset  ? Stroke Mother   ? Suicidality Father   ? Stroke Brother   ? Stroke Son   ? Sleep apnea Son   ? ?Social History  ? ?Socioeconomic History  ? Marital status: Divorced  ?  Spouse name: Not on file  ? Number of children: Not on file  ? Years of education: Not on file  ? Highest education level: Not on file  ?Occupational History  ? Not on file  ?Tobacco Use  ? Smoking status: Former  ?  Packs/day: 1.00  ?  Types: Cigarettes  ?  Quit date: 03/08/1999  ?   Years since quitting: 22.5  ? Smokeless tobacco: Never  ?Vaping Use  ? Vaping Use: Never used  ?Substance and Sexual Activity  ? Alcohol use: Yes  ?  Alcohol/week: 7.0 standard drinks  ?  Types: 7 Shots of liquor pe

## 2021-09-04 HISTORY — PX: OTHER SURGICAL HISTORY: SHX169

## 2021-09-07 ENCOUNTER — Ambulatory Visit (INDEPENDENT_AMBULATORY_CARE_PROVIDER_SITE_OTHER): Payer: Medicare Other | Admitting: Neurology

## 2021-09-07 DIAGNOSIS — G4761 Periodic limb movement disorder: Secondary | ICD-10-CM | POA: Diagnosis not present

## 2021-09-07 DIAGNOSIS — R519 Headache, unspecified: Secondary | ICD-10-CM

## 2021-09-07 DIAGNOSIS — I10 Essential (primary) hypertension: Secondary | ICD-10-CM

## 2021-09-07 DIAGNOSIS — G4719 Other hypersomnia: Secondary | ICD-10-CM

## 2021-09-07 DIAGNOSIS — R0683 Snoring: Secondary | ICD-10-CM

## 2021-09-07 DIAGNOSIS — G4700 Insomnia, unspecified: Secondary | ICD-10-CM

## 2021-09-07 DIAGNOSIS — Z820 Family history of epilepsy and other diseases of the nervous system: Secondary | ICD-10-CM

## 2021-09-07 DIAGNOSIS — I428 Other cardiomyopathies: Secondary | ICD-10-CM

## 2021-09-07 DIAGNOSIS — R351 Nocturia: Secondary | ICD-10-CM

## 2021-09-07 DIAGNOSIS — G4720 Circadian rhythm sleep disorder, unspecified type: Secondary | ICD-10-CM

## 2021-09-17 NOTE — Procedures (Signed)
PATIENT'S NAME:  Marissa Hall, Marissa Hall ?DOB:      12/12/1940      ?MR#:    [id number]     ?DATE OF RECORDING: 09/07/2021 ?REFERRING M.D.:  Shawnie Dapper, MD ?Study Performed:   Baseline Polysomnogram ?HISTORY: 81 year old woman with a history of breast cancer, status post lumpectomy, status post chemotherapy, neuropathy, hypertension, hyperlipidemia, hypothyroidism, lumbar radiculopathy, history of DVT, anxiety, depression, syncope, non-ischemic cardiomyopathy, osteoporosis, remote history of TIA (per chart review), reflux disease, diabetes, and cataracts, who reports sleep disruption, nonrestorative sleep, daytime tiredness and difficult to control blood pressure. ?The patient endorsed the Epworth Sleepiness Scale at 11 points. The patient's weight 164 pounds with a height of 68 (inches), resulting in a BMI of 24.7 kg/m2. The patient's neck circumference measured 14 inches. ? ?CURRENT MEDICATIONS: Lipitor, Coreg, Vitamin D3, Voltaren, Cymbalta, Apresoline, Glucophage-XR, Synthroid, Desyrel, Diovan, Aspirin ?  ?PROCEDURE:  This is a multichannel digital polysomnogram utilizing the Somnostar 11.2 system.  Electrodes and sensors were applied and monitored per AASM Specifications.   EEG, EOG, Chin and Limb EMG, were sampled at 200 Hz.  ECG, Snore and Nasal Pressure, Thermal Airflow, Respiratory Effort, CPAP Flow and Pressure, Oximetry was sampled at 50 Hz. Digital video and audio were recorded.     ? ?BASELINE STUDY ? ?Lights Out was at 22:17 and Lights On at 05:14.  Total recording time (TRT) was 417 minutes, with a total sleep time (TST) of 347.5 minutes.   The patient's sleep latency was 58.5 minutes, which is delayed. REM latency was 255.5 minutes, which is markedly delayed. The sleep efficiency was 83.3%.  ?   ?SLEEP ARCHITECTURE: WASO (Wake after sleep onset) was 11 minutes without significant sleep fragmentation noted.  There were 6 minutes in Stage N1, 226 minutes Stage N2, 88.5 minutes Stage N3 and 27 minutes in  Stage REM. The percentage of Stage N1 was 1.7%, Stage N2 was 65.%, which is increased, Stage N3 was 25.5%, which is mildly increased, and Stage R (REM sleep) was 7.8%, which is reduced. The arousals were noted as: 44 were spontaneous, 13 were associated with PLMs, 2 were associated with respiratory events. ? ?RESPIRATORY ANALYSIS:  There were a total of 5 respiratory events:  0 obstructive apneas, 0 central apneas and 0 mixed apneas with a total of 0 apneas and an apnea index (AI) of 0 /hour. There were 5 hypopneas with a hypopnea index of .9 /hour. The patient also had 0 respiratory event related arousals (RERAs).  ?    ?The total APNEA/HYPOPNEA INDEX (AHI) was .9/hour and the total RESPIRATORY DISTURBANCE INDEX was  .9 /hour.  0 events occurred in REM sleep and 10 events in NREM. The REM AHI was  0 /hour, versus a non-REM AHI of .9. The patient spent 41.5 minutes of total sleep time in the supine position and 306 minutes in non-supine.. The supine AHI was 2.9 versus a non-supine AHI of 0.6. ? ?OXYGEN SATURATION & C02:  The Wake baseline 02 saturation was 94%, with the lowest being 86%. Time spent below 89% saturation equaled 1 minutes. ? ?PERIODIC LIMB MOVEMENTS: The patient had a total of 720 Periodic Limb Movements.  The Periodic Limb Movement (PLM) index was 124.3 and the PLM Arousal index was 2.2/hour. ? ?Audio and video analysis did not show any abnormal or unusual movements, behaviors, phonations or vocalizations. The patient took no bathroom breaks. Intermittent mild to moderate snoring was noted. The EKG was in keeping with normal sinus rhythm (NSR). ? ?Post-study, the  patient indicated that sleep was better than usual.  ? ?IMPRESSION: ? ?Periodic Limb Movement Disorder (PLMD) ?Primary Snoring ?Dysfunctions associated with sleep stages or arousal from sleep ? ?RECOMMENDATIONS: ? ?This study does not demonstrate any significant obstructive or central sleep disordered breathing with the exception of  intermittent, mild to moderate snoring.  ?Severe PLMs (periodic limb movements of sleep) were noted during this study with no significant arousals or sleep disruption; clinical correlation is recommended. Medication effect from the antidepressant medication should be considered. The mere presence of PLMs does not warrant treatment. ?This study shows no significant sleep fragmentation, but abnormal sleep stage percentages; these are nonspecific findings and per se do not signify an intrinsic sleep disorder or a cause for the patient's sleep-related symptoms. Causes include (but are not limited to) the first night effect of the sleep study, circadian rhythm disturbances, medication effect or an underlying mood disorder or medical problem.  ?The patient should be cautioned not to drive, work at heights, or operate dangerous or heavy equipment when tired or sleepy. Review and reiteration of good sleep hygiene measures should be pursued with any patient. ?The patient will be advised to follow up with the referring provider, who will be notified of the test results. ? ?I certify that I have reviewed the entire raw data recording prior to the issuance of this report in accordance with the Standards of Accreditation of the [place] [employer] (AASM) ? ?Star Age, MD, PhD ?Diplomat, American Board of Neurology and Sleep Medicine (Neurology and Sleep Medicine) ? ? ? ?

## 2021-09-21 ENCOUNTER — Telehealth: Payer: Self-pay | Admitting: *Deleted

## 2021-09-21 NOTE — Telephone Encounter (Signed)
Called pt & discussed her recent sleep study results. Pt verbalized understanding of the details of the results as noted below by Dr Rexene Alberts. Pt verbalized appreciation for the call. She will f/u with Dr Anitra Lauth. Results sent to him.  ?

## 2021-09-21 NOTE — Telephone Encounter (Signed)
-----   Message from Star Age, MD sent at 09/17/2021  1:20 PM EDT ----- ?Patient referred by her PCP, Dr. Anitra Lauth, seen by me on 08/30/21, diagnostic PSG on 09/07/21.   ?Please call and notify the patient that the recent sleep study did not show any significant obstructive sleep apneawith the exception of intermittent, mild to moderate snoring. She had leg movements during sleep but no sleep disruption from these. Leg movements and twitching can be seen with certain antidepressant medications, including Cymbalta. It took her a while to fall asleep, but once she was asleep, she slept fairly well and achieved all stages of sleep, more light and deep sleep, less dream sleep, but overall slept about 83% of the time tested, which is actually quite good for a sleep study.  ?At this juncture, she can follow-up with her PCP as scheduled/planned.  ? ?Thanks, ? ?Star Age, MD, PhD ?[facility]) ? ?

## 2021-09-22 ENCOUNTER — Encounter: Payer: Self-pay | Admitting: Family Medicine

## 2021-09-27 DIAGNOSIS — Z7984 Long term (current) use of oral hypoglycemic drugs: Secondary | ICD-10-CM | POA: Diagnosis not present

## 2021-09-27 DIAGNOSIS — H0288A Meibomian gland dysfunction right eye, upper and lower eyelids: Secondary | ICD-10-CM | POA: Diagnosis not present

## 2021-09-27 DIAGNOSIS — H04123 Dry eye syndrome of bilateral lacrimal glands: Secondary | ICD-10-CM | POA: Diagnosis not present

## 2021-09-27 DIAGNOSIS — H35361 Drusen (degenerative) of macula, right eye: Secondary | ICD-10-CM | POA: Diagnosis not present

## 2021-09-27 DIAGNOSIS — E119 Type 2 diabetes mellitus without complications: Secondary | ICD-10-CM | POA: Diagnosis not present

## 2021-09-27 DIAGNOSIS — H524 Presbyopia: Secondary | ICD-10-CM | POA: Diagnosis not present

## 2021-09-27 DIAGNOSIS — H0288B Meibomian gland dysfunction left eye, upper and lower eyelids: Secondary | ICD-10-CM | POA: Diagnosis not present

## 2021-09-27 DIAGNOSIS — D3132 Benign neoplasm of left choroid: Secondary | ICD-10-CM | POA: Diagnosis not present

## 2021-09-27 LAB — HM DIABETES EYE EXAM

## 2021-10-04 ENCOUNTER — Other Ambulatory Visit: Payer: Self-pay | Admitting: Family Medicine

## 2021-10-04 NOTE — Telephone Encounter (Signed)
Pt has upcoming appt on 5/2 ?

## 2021-10-05 ENCOUNTER — Emergency Department (HOSPITAL_COMMUNITY): Payer: Medicare Other

## 2021-10-05 ENCOUNTER — Ambulatory Visit (INDEPENDENT_AMBULATORY_CARE_PROVIDER_SITE_OTHER): Payer: Medicare Other | Admitting: Family Medicine

## 2021-10-05 ENCOUNTER — Inpatient Hospital Stay (HOSPITAL_COMMUNITY)
Admission: EM | Admit: 2021-10-05 | Discharge: 2021-10-13 | DRG: 522 | Disposition: A | Discharge status: Skilled Nursing Facility | Payer: Medicare Other | Attending: Internal Medicine | Admitting: Internal Medicine

## 2021-10-05 ENCOUNTER — Other Ambulatory Visit: Payer: Self-pay

## 2021-10-05 ENCOUNTER — Encounter: Payer: Self-pay | Admitting: Family Medicine

## 2021-10-05 ENCOUNTER — Encounter (HOSPITAL_COMMUNITY): Payer: Self-pay

## 2021-10-05 VITALS — BP 144/74 | HR 60 | Temp 98.4°F | Ht 68.0 in | Wt 161.2 lb

## 2021-10-05 DIAGNOSIS — I10 Essential (primary) hypertension: Secondary | ICD-10-CM | POA: Diagnosis not present

## 2021-10-05 DIAGNOSIS — S3993XA Unspecified injury of pelvis, initial encounter: Secondary | ICD-10-CM | POA: Diagnosis not present

## 2021-10-05 DIAGNOSIS — M4312 Spondylolisthesis, cervical region: Secondary | ICD-10-CM | POA: Diagnosis not present

## 2021-10-05 DIAGNOSIS — Z853 Personal history of malignant neoplasm of breast: Secondary | ICD-10-CM

## 2021-10-05 DIAGNOSIS — S72002A Fracture of unspecified part of neck of left femur, initial encounter for closed fracture: Principal | ICD-10-CM | POA: Diagnosis present

## 2021-10-05 DIAGNOSIS — Z23 Encounter for immunization: Secondary | ICD-10-CM

## 2021-10-05 DIAGNOSIS — F10229 Alcohol dependence with intoxication, unspecified: Secondary | ICD-10-CM | POA: Diagnosis present

## 2021-10-05 DIAGNOSIS — Z6824 Body mass index (BMI) 24.0-24.9, adult: Secondary | ICD-10-CM

## 2021-10-05 DIAGNOSIS — E119 Type 2 diabetes mellitus without complications: Secondary | ICD-10-CM

## 2021-10-05 DIAGNOSIS — I493 Ventricular premature depolarization: Secondary | ICD-10-CM | POA: Diagnosis present

## 2021-10-05 DIAGNOSIS — M5416 Radiculopathy, lumbar region: Secondary | ICD-10-CM | POA: Diagnosis present

## 2021-10-05 DIAGNOSIS — E039 Hypothyroidism, unspecified: Secondary | ICD-10-CM | POA: Diagnosis present

## 2021-10-05 DIAGNOSIS — W19XXXA Unspecified fall, initial encounter: Secondary | ICD-10-CM | POA: Diagnosis not present

## 2021-10-05 DIAGNOSIS — Z823 Family history of stroke: Secondary | ICD-10-CM

## 2021-10-05 DIAGNOSIS — M1612 Unilateral primary osteoarthritis, left hip: Secondary | ICD-10-CM | POA: Diagnosis not present

## 2021-10-05 DIAGNOSIS — K219 Gastro-esophageal reflux disease without esophagitis: Secondary | ICD-10-CM | POA: Diagnosis present

## 2021-10-05 DIAGNOSIS — W1830XA Fall on same level, unspecified, initial encounter: Secondary | ICD-10-CM | POA: Diagnosis present

## 2021-10-05 DIAGNOSIS — Z7984 Long term (current) use of oral hypoglycemic drugs: Secondary | ICD-10-CM

## 2021-10-05 DIAGNOSIS — I152 Hypertension secondary to endocrine disorders: Secondary | ICD-10-CM | POA: Diagnosis present

## 2021-10-05 DIAGNOSIS — Z8673 Personal history of transient ischemic attack (TIA), and cerebral infarction without residual deficits: Secondary | ICD-10-CM

## 2021-10-05 DIAGNOSIS — S0990XA Unspecified injury of head, initial encounter: Secondary | ICD-10-CM | POA: Diagnosis not present

## 2021-10-05 DIAGNOSIS — Z79890 Hormone replacement therapy (postmenopausal): Secondary | ICD-10-CM

## 2021-10-05 DIAGNOSIS — Z66 Do not resuscitate: Secondary | ICD-10-CM | POA: Diagnosis present

## 2021-10-05 DIAGNOSIS — E785 Hyperlipidemia, unspecified: Secondary | ICD-10-CM | POA: Diagnosis present

## 2021-10-05 DIAGNOSIS — M47812 Spondylosis without myelopathy or radiculopathy, cervical region: Secondary | ICD-10-CM | POA: Diagnosis not present

## 2021-10-05 DIAGNOSIS — F1090 Alcohol use, unspecified, uncomplicated: Secondary | ICD-10-CM

## 2021-10-05 DIAGNOSIS — Z87891 Personal history of nicotine dependence: Secondary | ICD-10-CM

## 2021-10-05 DIAGNOSIS — E1142 Type 2 diabetes mellitus with diabetic polyneuropathy: Secondary | ICD-10-CM | POA: Diagnosis present

## 2021-10-05 DIAGNOSIS — E1169 Type 2 diabetes mellitus with other specified complication: Secondary | ICD-10-CM | POA: Diagnosis present

## 2021-10-05 DIAGNOSIS — Y92481 Parking lot as the place of occurrence of the external cause: Secondary | ICD-10-CM

## 2021-10-05 DIAGNOSIS — M25552 Pain in left hip: Secondary | ICD-10-CM | POA: Diagnosis not present

## 2021-10-05 DIAGNOSIS — R55 Syncope and collapse: Secondary | ICD-10-CM | POA: Diagnosis present

## 2021-10-05 DIAGNOSIS — D696 Thrombocytopenia, unspecified: Secondary | ICD-10-CM | POA: Diagnosis not present

## 2021-10-05 DIAGNOSIS — E875 Hyperkalemia: Secondary | ICD-10-CM | POA: Diagnosis not present

## 2021-10-05 DIAGNOSIS — D62 Acute posthemorrhagic anemia: Secondary | ICD-10-CM | POA: Diagnosis not present

## 2021-10-05 DIAGNOSIS — Z86718 Personal history of other venous thrombosis and embolism: Secondary | ICD-10-CM

## 2021-10-05 DIAGNOSIS — Z9841 Cataract extraction status, right eye: Secondary | ICD-10-CM

## 2021-10-05 DIAGNOSIS — Z79899 Other long term (current) drug therapy: Secondary | ICD-10-CM

## 2021-10-05 DIAGNOSIS — Z880 Allergy status to penicillin: Secondary | ICD-10-CM

## 2021-10-05 DIAGNOSIS — I428 Other cardiomyopathies: Secondary | ICD-10-CM | POA: Diagnosis present

## 2021-10-05 DIAGNOSIS — E871 Hypo-osmolality and hyponatremia: Secondary | ICD-10-CM | POA: Diagnosis present

## 2021-10-05 DIAGNOSIS — S0003XA Contusion of scalp, initial encounter: Secondary | ICD-10-CM | POA: Diagnosis not present

## 2021-10-05 DIAGNOSIS — Z90710 Acquired absence of both cervix and uterus: Secondary | ICD-10-CM

## 2021-10-05 DIAGNOSIS — Z8616 Personal history of COVID-19: Secondary | ICD-10-CM

## 2021-10-05 DIAGNOSIS — Z9842 Cataract extraction status, left eye: Secondary | ICD-10-CM

## 2021-10-05 DIAGNOSIS — I951 Orthostatic hypotension: Secondary | ICD-10-CM | POA: Diagnosis not present

## 2021-10-05 DIAGNOSIS — F419 Anxiety disorder, unspecified: Secondary | ICD-10-CM | POA: Diagnosis present

## 2021-10-05 DIAGNOSIS — Z043 Encounter for examination and observation following other accident: Secondary | ICD-10-CM | POA: Diagnosis not present

## 2021-10-05 DIAGNOSIS — F32A Depression, unspecified: Secondary | ICD-10-CM | POA: Diagnosis present

## 2021-10-05 DIAGNOSIS — Z888 Allergy status to other drugs, medicaments and biological substances status: Secondary | ICD-10-CM

## 2021-10-05 DIAGNOSIS — Z789 Other specified health status: Secondary | ICD-10-CM | POA: Diagnosis present

## 2021-10-05 DIAGNOSIS — E1159 Type 2 diabetes mellitus with other circulatory complications: Secondary | ICD-10-CM | POA: Diagnosis present

## 2021-10-05 DIAGNOSIS — M8580 Other specified disorders of bone density and structure, unspecified site: Secondary | ICD-10-CM | POA: Diagnosis present

## 2021-10-05 DIAGNOSIS — E440 Moderate protein-calorie malnutrition: Secondary | ICD-10-CM | POA: Insufficient documentation

## 2021-10-05 DIAGNOSIS — Y9301 Activity, walking, marching and hiking: Secondary | ICD-10-CM | POA: Diagnosis present

## 2021-10-05 LAB — BASIC METABOLIC PANEL
BUN: 21 mg/dL (ref 6–23)
CO2: 28 mEq/L (ref 19–32)
Calcium: 9.6 mg/dL (ref 8.4–10.5)
Chloride: 101 mEq/L (ref 96–112)
Creatinine, Ser: 1.12 mg/dL (ref 0.40–1.20)
GFR: 47.12 mL/min — ABNORMAL LOW (ref >60.00)
Glucose, Bld: 133 mg/dL — ABNORMAL HIGH (ref 70–99)
Potassium: 5.8 mEq/L — ABNORMAL HIGH (ref 3.5–5.1)
Sodium: 137 mEq/L (ref 135–145)

## 2021-10-05 MED ORDER — DULOXETINE HCL 60 MG PO CPEP: 60.0000 mg | ORAL_CAPSULE | Freq: Every day | ORAL | 1 refills | Status: DC

## 2021-10-05 MED ORDER — ACETAMINOPHEN 325 MG PO TABS
650.0000 mg | ORAL_TABLET | Freq: Once | ORAL | Status: AC
  Administered 2021-10-05: 650 mg via ORAL
  Filled 2021-10-05: qty 2

## 2021-10-05 MED ORDER — TRAZODONE HCL 50 MG PO TABS: 50.0000 mg | ORAL_TABLET | Freq: Every day | ORAL | 1 refills | Status: DC

## 2021-10-05 MED ORDER — TETANUS-DIPHTH-ACELL PERTUSSIS 5-2.5-18.5 LF-MCG/0.5 IM SUSY
0.5000 mL | PREFILLED_SYRINGE | Freq: Once | INTRAMUSCULAR | Status: AC
  Administered 2021-10-05: 0.5 mL via INTRAMUSCULAR
  Filled 2021-10-05: qty 0.5

## 2021-10-05 MED ORDER — VALSARTAN 320 MG PO TABS: 320.0000 mg | ORAL_TABLET | Freq: Every day | ORAL | 1 refills | Status: DC

## 2021-10-05 MED ORDER — CARVEDILOL 6.25 MG PO TABS: 6.2500 mg | ORAL_TABLET | Freq: Two times a day (BID) | ORAL | 1 refills | Status: DC

## 2021-10-05 NOTE — ED Provider Notes (Signed)
?[facility] DEPT ?Provider Note ? ? ?CSN: [id number] ?Arrival date & time: 10/05/21  2123 ? ?  ? ?History ? ?Chief Complaint  ?Patient presents with  ? Fall  ? ? ?Marissa Hall is a 81 y.o. female. ? ?HPI ?81 year old female with a history of hypertension, hypothyroidism, GERD, DM type II, TIA, breast cancer, alcoholism presents to the ER via EMS from a restaurant in [place].  Patient states that she was leaving the restaurant and fell.  She had been drinking beer and wine and states that she had not eaten much throughout the day.  She is visibly intoxicated.  She denies being on any anticoagulation.  She complains of left hip pain.  She was unclear if she hit her head but does have a superficial laceration to her left posterior scalp, bleeding is controlled.  She denies any vision changes, nausea, vomiting.  Denies any neck pain, numbness or tingling in her extremities.  She arrives in a c-collar. She is unclear of when her last tetanus shot was  ?  ? ?Home Medications ?Prior to Admission medications   ?Medication Sig Start Date End Date Taking? Authorizing Provider  ?atorvastatin (LIPITOR) 80 MG tablet Take 1 tablet (80 mg total) by mouth every evening. 02/25/21   Shamleffer, Melanie Crazier, MD  ?carvedilol (COREG) 6.25 MG tablet Take 1 tablet (6.25 mg total) by mouth 2 (two) times daily with a meal. 10/05/21   McGowen, Adrian Blackwater, MD  ?cholecalciferol (VITAMIN D3) 25 MCG (1000 UNIT) tablet Take 1,000 Units by mouth daily.    [provider]  ?diclofenac Sodium (VOLTAREN) 1 % GEL Apply 2 g topically 4 (four) times daily. ?Patient taking differently: Apply 2 g topically as needed. 01/05/21   Wieters, Hallie C, PA-C  ?DULoxetine (CYMBALTA) 60 MG capsule Take 1 capsule (60 mg total) by mouth daily. 10/05/21   McGowen, Adrian Blackwater, MD  ?hydrALAZINE (APRESOLINE) 25 MG tablet Take 1 tablet (25 mg total) by mouth 3 (three) times daily. 08/19/21   McGowen, Adrian Blackwater, MD  ?metFORMIN  (GLUCOPHAGE-XR) 500 MG 24 hr tablet Take 2 tablets (1,000 mg total) by mouth daily before supper. 07/20/21   Shamleffer, Melanie Crazier, MD  ?OVER THE COUNTER MEDICATION Take 400 mg by mouth 2 (two) times daily with a meal. Palmitoylethanolamide    [provider]  ?Propylene Glycol (SYSTANE BALANCE OP) Place 1 drop into both eyes daily as needed (for dry eyes).    [provider]  ?SYNTHROID 125 MCG tablet Take 1 tablet (125 mcg total) by mouth daily before breakfast. 07/22/21   Shamleffer, Melanie Crazier, MD  ?traZODone (DESYREL) 50 MG tablet Take 1 tablet (50 mg total) by mouth at bedtime. 10/05/21   McGowen, Adrian Blackwater, MD  ?valsartan (DIOVAN) 320 MG tablet Take 1 tablet (320 mg total) by mouth daily. 10/05/21   McGowen, Adrian Blackwater, MD  ?   ? ?Allergies    ?Other, Prednisone, Amlodipine, Hydrochlorothiazide, and Penicillins   ? ?Review of Systems   ?Review of Systems ?Ten systems reviewed and are negative for acute change, except as noted in the HPI.   ?Physical Exam ?Updated Vital Signs ?BP (!) 149/132   Pulse 77   Temp 98.2 ?F (36.8 ?C) (Oral)   Resp 17   SpO2 94%  ?Physical Exam ?Vitals and nursing note reviewed.  ?Constitutional:   ?   General: She is not in acute distress. ?   Appearance: She is well-developed.  ?HENT:  ?   Head:  Normocephalic and atraumatic.  ?   Comments: Superficial abrasion to the left posterior scalp, bleeding controlled.No of hemotympanum, raccoon eyes, battle sign.  No mastoid tenderness.  No malocclusion.  No cranial deformities.  C-collar in place. ? ? ?Eyes:  ?   Conjunctiva/sclera: Conjunctivae normal.  ?Cardiovascular:  ?   Rate and Rhythm: Normal rate and regular rhythm.  ?   Heart sounds: No murmur heard. ?Pulmonary:  ?   Effort: Pulmonary effort is normal. No respiratory distress.  ?   Breath sounds: Normal breath sounds.  ?Abdominal:  ?   Palpations: Abdomen is soft.  ?   Tenderness: There is no abdominal tenderness.  ?Musculoskeletal:     ?   General: No  swelling.  ?   Cervical back: Neck supple.  ?   Comments: 5/5 strength in upper lower extremities bilaterally, sensations grossly intact.  No visible leg shortening, internal or external rotation.  Patient has pain with active straight leg raise, however no pain with passive straight leg raise.  2+ DP pulses.  No cervical, thoracic, or lumbar midline tenderness, no step-offs or crepitus.  ?Skin: ?   General: Skin is warm and dry.  ?   Capillary Refill: Capillary refill takes less than 2 seconds.  ?   Findings: Erythema and lesion present.  ?   Comments: Superficial abrasion to the left elbow, bleeding controlled  ?Neurological:  ?   General: No focal deficit present.  ?   Mental Status: She is alert and oriented to person, place, and time.  ?   Sensory: No sensory deficit.  ?   Motor: No weakness.  ?   Comments: Intoxicated, however speech is coherent, able to answer questions appropriately, alert and oriented x3  ?Psychiatric:     ?   Mood and Affect: Mood normal.  ? ? ?ED Results / Procedures / Treatments   ?Labs ?(all labs ordered are listed, but only abnormal results are displayed) ?Labs Reviewed  ?CBC  ?BASIC METABOLIC PANEL  ?ETHANOL  ? ? ?EKG ?None ? ?Radiology ?CT Head Wo Contrast ? ?Result Date: 10/05/2021 ?CLINICAL DATA:  Head trauma could be EXAM: CT HEAD WITHOUT CONTRAST CT CERVICAL SPINE WITHOUT CONTRAST TECHNIQUE: Multidetector CT imaging of the head and cervical spine was performed following the standard protocol without intravenous contrast. Multiplanar CT image reconstructions of the cervical spine were also generated. RADIATION DOSE REDUCTION: This exam was performed according to the departmental dose-optimization program which includes automated exposure control, adjustment of the mA and/or kV according to patient size and/or use of iterative reconstruction technique. COMPARISON:  CT brain 07/14/2021 FINDINGS: CT HEAD FINDINGS Brain: No acute territorial infarction, hemorrhage or intracranial mass.  Mild atrophy. Chronic lacunar infarct in the right basal ganglia/white matter. Nonenlarged ventricles. Vascular: No hyperdense vessels. Vertebral and carotid vascular calcification Skull: Normal. Negative for fracture or focal lesion. Sinuses/Orbits: No acute finding. Other: Small left posterior parietal scalp hematoma CT CERVICAL SPINE FINDINGS Alignment: Trace anterolisthesis C3 on C4. Generalized straightening. Facet alignment within normal limits Skull base and vertebrae: No acute fracture. No primary bone lesion or focal pathologic process. Soft tissues and spinal canal: No prevertebral fluid or swelling. No visible canal hematoma. Disc levels: Multilevel degenerative change. Mild disc space narrowing at C3-C4 and C4-C5 with moderate severe disc space narrowing C5-C6 and C6-C7. Facet degenerative changes at multiple levels with foraminal narrowing Upper chest: Negative. Other: None IMPRESSION: 1. No CT evidence for acute intracranial abnormality.  Mild atrophy 2. Shortening of the cervical  spine with degenerative changes. No acute osseous abnormality Electronically Signed   By: Donavan Foil M.D.   On: 10/05/2021 22:55  ? ?CT Cervical Spine Wo Contrast ? ?Result Date: 10/05/2021 ?CLINICAL DATA:  Head trauma could be EXAM: CT HEAD WITHOUT CONTRAST CT CERVICAL SPINE WITHOUT CONTRAST TECHNIQUE: Multidetector CT imaging of the head and cervical spine was performed following the standard protocol without intravenous contrast. Multiplanar CT image reconstructions of the cervical spine were also generated. RADIATION DOSE REDUCTION: This exam was performed according to the departmental dose-optimization program which includes automated exposure control, adjustment of the mA and/or kV according to patient size and/or use of iterative reconstruction technique. COMPARISON:  CT brain 07/14/2021 FINDINGS: CT HEAD FINDINGS Brain: No acute territorial infarction, hemorrhage or intracranial mass. Mild atrophy. Chronic lacunar  infarct in the right basal ganglia/white matter. Nonenlarged ventricles. Vascular: No hyperdense vessels. Vertebral and carotid vascular calcification Skull: Normal. Negative for fracture or focal lesion. Sinuses/Orbits: No

## 2021-10-05 NOTE — ED Triage Notes (Signed)
Pt BIB EMS from a restaurant in [place]. Pt fell in the parking lot on her way out. Pt was drinking beer and wine (at least 3 glasses of wine). Pt has a scrape on her elbow and pain in her left hip. Pt has a small laceration on the back of her head. ?

## 2021-10-05 NOTE — ED Notes (Signed)
Pt has returned from xray

## 2021-10-05 NOTE — ED Notes (Signed)
Pt reports she did not eat anything today and had 3 glasses of wine. She reports that she has had syncopal episodes before when she has been drinking. She reports her doc told her if she had them again he was going to put in defibrillator. Speech is slurred. Wound on arm cleaned.  ?

## 2021-10-05 NOTE — ED Notes (Signed)
Patient transported to CT 

## 2021-10-05 NOTE — Progress Notes (Signed)
OFFICE VISIT ? ?10/05/2021 ? ?CC:  ?Chief Complaint  ?Patient presents with  ? Hypertension  ?  Pt is not fasting (coffee with creamer); brought BP machine from home for calibration.  ? ?Patient is a 81 y.o. female who presents for f/u uncontrolled HTN. ?I last saw her 08/27/21 (virtual visit). ?A/P as of that visit: ?"#1 uncontrolled hypertension.  Improving. ?We will change from metoprolol to Coreg.  Start 6.25 twice daily and when I see her back in a week will hopefully be able to increase the dose if needed.  Discussed possible effect on her heart rate and energy level today. ?Continue valsartan and 320/day and hydralazine 25 3 times daily. ?Continue blood pressure checks but just checked twice a day instead of 3 times. ?She has sleep MD initial evaluation in 3 days. ? ?2.  Major depressive disorder, recurrent. ?Generalized anxiety with episodic worsening lately--since she quit drinking alcohol. ?Gradually improving with recent increase of Cymbalta from 30 to 60 mg a day. ?Has alprazolam but she understands will need to be careful with this medication in the context of alcoholism.  She uses it rarely at this time. ?Trazodone helping for insomnia." ? ?INTERIM HX: ?Verner says she feels better overall. ?Home blood pressures are 160s over 60s lately. ?She checked her machine against ours today and systolic was 20 points higher on her machine. ? ?She unfortunately continues to drink at least a bottle of wine and several beers per day. ?She attributes her need to drink to excessive anxiety but admits her intake is excessive. ?She has gone a couple days at a time without alcohol and says she has not experienced any withdrawal symptoms.  Her mother and father were both alcoholics and several other family members were alcoholics as well. ?She has not abused any drugs. ?She expresses interest in possibly getting on naloxone because a friend got on it and it has helped her abstain from drinking well. ? ?ROS as above,  plus--> no fevers, no CP, no SOB, no wheezing, no cough, no dizziness, no HAs, no rashes, no melena/hematochezia.  No polyuria or polydipsia.  No myalgias or arthralgias.  No focal weakness, paresthesias, or tremors.  No acute vision or hearing abnormalities.  No dysuria or unusual/new urinary urgency or frequency.  No recent changes in lower legs. ?No n/v/d or abd pain.  No palpitations.   ? ?Past Medical History:  ?Diagnosis Date  ? Acute DVT (deep venous thrombosis) ([place]) 04/23/2019  ? Right popliteal  ? Alcoholism ([place])   ? Anxiety and depression 1964  ? oncologist started duloxetine 12/2016  ? Breast cancer ([place]) 03/14/2016  ? Clinical stage 2A: (triple neg): Right breast, upper inner quadrant, 03/2016.  Neoadjuvant chemo x 5 cycles,lumpectomy 4 mo later, then RT started 10/2016.  Adjuvant Xeloda [id number].  SWOG research trial pt 04/2017--pt randomized to pembrolizumab immunotherapy.  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  ? Cataracts, bilateral 07/2017  ? 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 feet; responding well to cymbalta  ? COVID-19 virus infection 05/27/2020  ? Depression 1964  ? Patient states since age 67  ? Diabetes mellitus with complication [facility]) 9480  ? managed by endocrinology.  A1c Mar 12, 2018 was 7.0% at Dr. Shirlyn Goltz.   ? Epidermoid cyst of vulva   ? Chronic epidermoid cyst of the vulva.  Excision done 02/2019  ? GERD (gastroesophageal reflux disease) 2013  ?  Hyperlipidemia 1986  ? Hypertension 2008  ? 2022 ->addition of hctz to lisinopril led to 10m drop in GFR. HCTZ d/cd.  ? Hypothyroidism 1988  ? Diagnosed in her 453s  Managed by Endocrinologist  ? Lumbar radiculopathy 04/2019  ? Dr. STamala Julianto get plain films of LB and hip (considering MRI due to her hx of cancer)  ? Nonischemic cardiomyopathy ([place]   ? Hx of takotsubo CM  ? Osteoporosis 2015  ? pt states "osteopenia", but then says that she  refused to take the rx med for this condition, so I suspect she had osteoporosis.  ? Peripheral neuropathy 2017  ? Patient states diabetic neuropathy in feet prior to starting chemotherapy and then worsened by chemo.   ? Syncope and collapse   ? [place]  No prodrome.  Monitor ordered and cardiology referral ordered 05/14/21  ? TIA (transient ischemic attack) 03/26/2011  ? 2012: question of (HA + R eye "floaters"). CT in ED neg acute. Not admitted, no f/u testing done.  ?ocular migraine?  ? ? ?Past Surgical History:  ?Procedure Laterality Date  ? ABDOMINAL HYSTERECTOMY  1972  ? APPENDECTOMY  1972  ? [place] ? BREAST IMPLANT REMOVAL Right 09/13/2016  ? Procedure: REMOVAL RIGHT BREAST IMPLANT;  Surgeon: BIrene Limbo MD;  [place] [facility]  Service: Plastics;  Laterality: Right;  ? BREAST LUMPECTOMY WITH RADIOACTIVE SEED AND SENTINEL LYMPH NODE BIOPSY Right 09/13/2016  ? Procedure: RIGHT BREAST LUMPECTOMY WITH RADIOACTIVE SEED X 2 AND SENTINEL LYMPH NODE BIOPSY;  Surgeon: DAlphonsa Overall MD;  [place]  Service: General;  Laterality: Right;  ? BREAST SURGERY Right 03/14/2016  ? Biopsy  ? CAPSULECTOMY Right 09/13/2016  ? Procedure: RIGHT CAPSULECTOMY;  Surgeon: BIrene Limbo MD;  [place] [facility]  Service: Plastics;  Laterality: Right;  ? CATARACT EXTRACTION, BILATERAL Bilateral 08/10/17 right eye, 08/31/17 left eye  ? MASS EXCISION Left 02/28/2018  ? Path: benign.  Procedure: EXCISIONLEFT MEDIAL THIGH MASS ERAS PATHWAY;  Surgeon: CErroll Luna MD;  [facility]  Service: General;  Laterality: Left;  ? PORTACATH PLACEMENT N/A 03/15/2016  ? Procedure: INSERTION PORT-A-CATH WITH [place]  Surgeon: DAlphonsa Overall MD;  Location[facility];  Service: General;  Laterality: N/A;  ? PORTACATH REMOVAL  07/2017  ? SLEEP STUDY  09/2021  ? NO SLEEP APNEA  ? surgical repair left ankle Left 2009  ? s[place]  [place]   [place] 48  ? [place]OTID DOPPLER BILATERAL (ATalmoHX)  01/2021  ? <50% bilat int carotid sten, otherwise normal.  ? ? ?Outpatient Medications Prior to Visit  ?Medication Sig Dispense Refill  ? atorvastatin (LIPITOR) 80 MG tablet Take 1 tablet (80 mg total) by mouth every evening. 90 tablet 3  ? cholecalciferol (VITAMIN D3) 25 MCG (1000 UNIT) tablet Take 1,000 Units by mouth daily.    ? diclofenac Sodium (VOLTAREN) 1 % GEL Apply 2 g topically 4 (four) times daily. (Patient taking differently: Apply 2 g topically as needed.) 150 g 0  ? hydrALAZINE (APRESOLINE) 25 MG tablet Take 1 tablet (25 mg total) by mouth 3 (three) times daily. 90 tablet 5  ? metFORMIN (GLUCOPHAGE-XR) 500 MG 24 hr tablet Take 2 tablets (1,000 mg total) by mouth daily before supper. 180 tablet 3  ? OVER THE COUNTER MEDICATION Take 400 mg by mouth 2 (two) times daily with a meal.  Palmitoylethanolamide    ? Propylene Glycol (SYSTANE BALANCE OP) Place 1 drop into both eyes daily as needed (for dry eyes).    ? SYNTHROID 125 MCG tablet Take 1 tablet (125 mcg total) by mouth daily before breakfast. 90 tablet 1  ? DULoxetine (CYMBALTA) 30 MG capsule 2 caps po qd 30 capsule 5  ? carvedilol (COREG) 6.25 MG tablet Take 1 tablet (6.25 mg total) by mouth 2 (two) times daily with a meal. 60 tablet 0  ? traZODone (DESYREL) 50 MG tablet Take 1 tablet (50 mg total) by mouth at bedtime. 90 tablet 3  ? valsartan (DIOVAN) 320 MG tablet Take 1 tablet (320 mg total) by mouth daily. 30 tablet 0  ? ?No facility-administered medications prior to visit.  ? ? ?Allergies  ?Allergen Reactions  ? Other Other (See Comments)  ?  STEROIDS- emotional  ? Prednisone Other (See Comments)  ?  Other reaction(s): Mental Status Changes (intolerance)  ? Amlodipine   ?  "Side effect"  ? Hydrochlorothiazide   ?  Elevated sCr  ? Penicillins Other (See Comments)  ?  Unsure of reaction, was 81 years old  ? ? ?ROS ?As per HPI ? ?PE: ? ?  10/05/2021  ?  11:12 AM 08/30/2021  ?  1:50 PM 08/27/2021  ?  8:19 AM  ?Vitals with BMI  ?Height '5\' 8"'$  '5\' 8"'$    ?Weight 161 lbs 3 oz 164 lbs   ?BMI 24.52 24.94   ?Systolic 568 127 517  ?Diastolic 74 74 91  ?Pulse 60 56 60  ? ? ?

## 2021-10-05 NOTE — ED Notes (Signed)
Pt encouraged to ambulate to restroom. She was able to stand with walker but said pain is too great.  ?

## 2021-10-05 NOTE — ED Notes (Signed)
Ice pack given

## 2021-10-06 ENCOUNTER — Inpatient Hospital Stay (HOSPITAL_COMMUNITY): Payer: Medicare Other

## 2021-10-06 ENCOUNTER — Inpatient Hospital Stay (HOSPITAL_COMMUNITY): Payer: Medicare Other | Admitting: Anesthesiology

## 2021-10-06 ENCOUNTER — Encounter (HOSPITAL_COMMUNITY)
Admission: EM | Disposition: A | Discharge status: Skilled Nursing Facility | Payer: Self-pay | Source: Home / Self Care | Attending: Internal Medicine

## 2021-10-06 ENCOUNTER — Other Ambulatory Visit: Payer: Self-pay

## 2021-10-06 ENCOUNTER — Telehealth: Payer: Self-pay | Admitting: Orthopaedic Surgery

## 2021-10-06 ENCOUNTER — Telehealth: Payer: Self-pay

## 2021-10-06 DIAGNOSIS — S72002A Fracture of unspecified part of neck of left femur, initial encounter for closed fracture: Secondary | ICD-10-CM | POA: Diagnosis present

## 2021-10-06 DIAGNOSIS — E785 Hyperlipidemia, unspecified: Secondary | ICD-10-CM | POA: Diagnosis not present

## 2021-10-06 DIAGNOSIS — Y9301 Activity, walking, marching and hiking: Secondary | ICD-10-CM | POA: Diagnosis present

## 2021-10-06 DIAGNOSIS — M25552 Pain in left hip: Secondary | ICD-10-CM | POA: Diagnosis not present

## 2021-10-06 DIAGNOSIS — E1169 Type 2 diabetes mellitus with other specified complication: Secondary | ICD-10-CM

## 2021-10-06 DIAGNOSIS — E039 Hypothyroidism, unspecified: Secondary | ICD-10-CM | POA: Diagnosis not present

## 2021-10-06 DIAGNOSIS — E119 Type 2 diabetes mellitus without complications: Secondary | ICD-10-CM

## 2021-10-06 DIAGNOSIS — M8580 Other specified disorders of bone density and structure, unspecified site: Secondary | ICD-10-CM | POA: Diagnosis present

## 2021-10-06 DIAGNOSIS — F419 Anxiety disorder, unspecified: Secondary | ICD-10-CM

## 2021-10-06 DIAGNOSIS — I493 Ventricular premature depolarization: Secondary | ICD-10-CM | POA: Diagnosis present

## 2021-10-06 DIAGNOSIS — R55 Syncope and collapse: Secondary | ICD-10-CM | POA: Diagnosis not present

## 2021-10-06 DIAGNOSIS — Z743 Need for continuous supervision: Secondary | ICD-10-CM | POA: Diagnosis not present

## 2021-10-06 DIAGNOSIS — I10 Essential (primary) hypertension: Secondary | ICD-10-CM

## 2021-10-06 DIAGNOSIS — Z789 Other specified health status: Secondary | ICD-10-CM

## 2021-10-06 DIAGNOSIS — E1159 Type 2 diabetes mellitus with other circulatory complications: Secondary | ICD-10-CM | POA: Diagnosis not present

## 2021-10-06 DIAGNOSIS — F10229 Alcohol dependence with intoxication, unspecified: Secondary | ICD-10-CM | POA: Diagnosis present

## 2021-10-06 DIAGNOSIS — Z471 Aftercare following joint replacement surgery: Secondary | ICD-10-CM | POA: Diagnosis not present

## 2021-10-06 DIAGNOSIS — Z87891 Personal history of nicotine dependence: Secondary | ICD-10-CM | POA: Diagnosis not present

## 2021-10-06 DIAGNOSIS — S72042A Displaced fracture of base of neck of left femur, initial encounter for closed fracture: Secondary | ICD-10-CM

## 2021-10-06 DIAGNOSIS — E440 Moderate protein-calorie malnutrition: Secondary | ICD-10-CM | POA: Diagnosis not present

## 2021-10-06 DIAGNOSIS — Z823 Family history of stroke: Secondary | ICD-10-CM | POA: Diagnosis not present

## 2021-10-06 DIAGNOSIS — R7989 Other specified abnormal findings of blood chemistry: Secondary | ICD-10-CM

## 2021-10-06 DIAGNOSIS — R2689 Other abnormalities of gait and mobility: Secondary | ICD-10-CM | POA: Diagnosis not present

## 2021-10-06 DIAGNOSIS — Z8616 Personal history of COVID-19: Secondary | ICD-10-CM | POA: Diagnosis not present

## 2021-10-06 DIAGNOSIS — D62 Acute posthemorrhagic anemia: Secondary | ICD-10-CM | POA: Diagnosis not present

## 2021-10-06 DIAGNOSIS — Z66 Do not resuscitate: Secondary | ICD-10-CM | POA: Diagnosis present

## 2021-10-06 DIAGNOSIS — I152 Hypertension secondary to endocrine disorders: Secondary | ICD-10-CM | POA: Diagnosis not present

## 2021-10-06 DIAGNOSIS — Z23 Encounter for immunization: Secondary | ICD-10-CM | POA: Diagnosis not present

## 2021-10-06 DIAGNOSIS — E875 Hyperkalemia: Secondary | ICD-10-CM | POA: Diagnosis not present

## 2021-10-06 DIAGNOSIS — I951 Orthostatic hypotension: Secondary | ICD-10-CM | POA: Diagnosis not present

## 2021-10-06 DIAGNOSIS — I428 Other cardiomyopathies: Secondary | ICD-10-CM | POA: Diagnosis present

## 2021-10-06 DIAGNOSIS — R279 Unspecified lack of coordination: Secondary | ICD-10-CM | POA: Diagnosis not present

## 2021-10-06 DIAGNOSIS — F1090 Alcohol use, unspecified, uncomplicated: Secondary | ICD-10-CM | POA: Diagnosis present

## 2021-10-06 DIAGNOSIS — Z7984 Long term (current) use of oral hypoglycemic drugs: Secondary | ICD-10-CM

## 2021-10-06 DIAGNOSIS — E1142 Type 2 diabetes mellitus with diabetic polyneuropathy: Secondary | ICD-10-CM | POA: Diagnosis present

## 2021-10-06 DIAGNOSIS — E871 Hypo-osmolality and hyponatremia: Secondary | ICD-10-CM | POA: Diagnosis present

## 2021-10-06 DIAGNOSIS — Y92481 Parking lot as the place of occurrence of the external cause: Secondary | ICD-10-CM | POA: Diagnosis not present

## 2021-10-06 DIAGNOSIS — W1830XA Fall on same level, unspecified, initial encounter: Secondary | ICD-10-CM | POA: Diagnosis present

## 2021-10-06 DIAGNOSIS — D696 Thrombocytopenia, unspecified: Secondary | ICD-10-CM | POA: Diagnosis not present

## 2021-10-06 DIAGNOSIS — F32A Depression, unspecified: Secondary | ICD-10-CM | POA: Diagnosis not present

## 2021-10-06 DIAGNOSIS — Z6824 Body mass index (BMI) 24.0-24.9, adult: Secondary | ICD-10-CM | POA: Diagnosis not present

## 2021-10-06 DIAGNOSIS — Z96642 Presence of left artificial hip joint: Secondary | ICD-10-CM | POA: Diagnosis not present

## 2021-10-06 HISTORY — PX: TOTAL HIP ARTHROPLASTY: SHX124

## 2021-10-06 LAB — TYPE AND SCREEN
ABO/RH(D): A POS
Antibody Screen: NEGATIVE

## 2021-10-06 LAB — ETHANOL: Alcohol, Ethyl (B): 160 mg/dL — ABNORMAL HIGH (ref <10)

## 2021-10-06 LAB — CBC
HCT: 39 % (ref 36.0–46.0)
Hemoglobin: 13.1 g/dL (ref 12.0–15.0)
MCH: 32.8 pg (ref 26.0–34.0)
MCHC: 33.6 g/dL (ref 30.0–36.0)
MCV: 97.7 fL (ref 80.0–100.0)
Platelets: 165 10*3/uL (ref 150–400)
RBC: 3.99 MIL/uL (ref 3.87–5.11)
RDW: 12.9 % (ref 11.5–15.5)
WBC: 8.8 10*3/uL (ref 4.0–10.5)
nRBC: 0 % (ref 0.0–0.2)

## 2021-10-06 LAB — BASIC METABOLIC PANEL
Anion gap: 12 (ref 5–15)
BUN: 19 mg/dL (ref 8–23)
CO2: 21 mmol/L — ABNORMAL LOW (ref 22–32)
Calcium: 9.1 mg/dL (ref 8.9–10.3)
Chloride: 100 mmol/L (ref 98–111)
Creatinine, Ser: 0.65 mg/dL (ref 0.44–1.00)
GFR, Estimated: >60 mL/min (ref >60)
Glucose, Bld: 228 mg/dL — ABNORMAL HIGH (ref 70–99)
Potassium: 4.2 mmol/L (ref 3.5–5.1)
Sodium: 133 mmol/L — ABNORMAL LOW (ref 135–145)

## 2021-10-06 LAB — SURGICAL PCR SCREEN
MRSA, PCR: NEGATIVE
Staphylococcus aureus: NEGATIVE

## 2021-10-06 LAB — GLUCOSE, CAPILLARY
Glucose-Capillary: 137 mg/dL — ABNORMAL HIGH (ref 70–99)
Glucose-Capillary: 141 mg/dL — ABNORMAL HIGH (ref 70–99)
Glucose-Capillary: 149 mg/dL — ABNORMAL HIGH (ref 70–99)
Glucose-Capillary: 155 mg/dL — ABNORMAL HIGH (ref 70–99)
Glucose-Capillary: 201 mg/dL — ABNORMAL HIGH (ref 70–99)
Glucose-Capillary: 228 mg/dL — ABNORMAL HIGH (ref 70–99)
Glucose-Capillary: 269 mg/dL — ABNORMAL HIGH (ref 70–99)

## 2021-10-06 LAB — ABO/RH: ABO/RH(D): A POS

## 2021-10-06 SURGERY — ARTHROPLASTY, HIP, TOTAL, ANTERIOR APPROACH
Anesthesia: General | Site: Hip | Laterality: Left

## 2021-10-06 MED ORDER — ONDANSETRON HCL 4 MG/2ML IJ SOLN
INTRAMUSCULAR | Status: DC | PRN
  Administered 2021-10-06: 4 mg via INTRAVENOUS

## 2021-10-06 MED ORDER — OXYCODONE-ACETAMINOPHEN 5-325 MG PO TABS: 1.0000 | ORAL_TABLET | ORAL | 0 refills | Status: DC | PRN

## 2021-10-06 MED ORDER — BUPIVACAINE LIPOSOME 1.3 % IJ SUSP
INTRAMUSCULAR | Status: DC | PRN
  Administered 2021-10-06: 10 mL

## 2021-10-06 MED ORDER — LIDOCAINE HCL (PF) 2 % IJ SOLN
INTRAMUSCULAR | Status: AC
  Filled 2021-10-06: qty 5

## 2021-10-06 MED ORDER — FENTANYL CITRATE (PF) 100 MCG/2ML IJ SOLN
INTRAMUSCULAR | Status: AC
  Filled 2021-10-06: qty 2

## 2021-10-06 MED ORDER — LACTATED RINGERS IV SOLN
INTRAVENOUS | Status: DC
Start: 2021-10-06 — End: 2021-10-06

## 2021-10-06 MED ORDER — CEFAZOLIN SODIUM-DEXTROSE 2-4 GM/100ML-% IV SOLN
2.0000 g | INTRAVENOUS | Status: AC
  Administered 2021-10-06: 2 g via INTRAVENOUS
  Filled 2021-10-06: qty 100

## 2021-10-06 MED ORDER — LABETALOL HCL 5 MG/ML IV SOLN
INTRAVENOUS | Status: AC
  Filled 2021-10-06: qty 4

## 2021-10-06 MED ORDER — DOCUSATE SODIUM 100 MG PO CAPS
100.0000 mg | ORAL_CAPSULE | Freq: Two times a day (BID) | ORAL | Status: DC
  Administered 2021-10-06 – 2021-10-13 (×14): 100 mg via ORAL
  Filled 2021-10-06 (×15): qty 1

## 2021-10-06 MED ORDER — ROCURONIUM BROMIDE 10 MG/ML (PF) SYRINGE
PREFILLED_SYRINGE | INTRAVENOUS | Status: DC | PRN
  Administered 2021-10-06: 50 mg via INTRAVENOUS
  Administered 2021-10-06: 20 mg via INTRAVENOUS

## 2021-10-06 MED ORDER — 0.9 % SODIUM CHLORIDE (POUR BTL) OPTIME
TOPICAL | Status: DC | PRN
  Administered 2021-10-06: 1000 mL

## 2021-10-06 MED ORDER — PHENYLEPHRINE 80 MCG/ML (10ML) SYRINGE FOR IV PUSH (FOR BLOOD PRESSURE SUPPORT)
PREFILLED_SYRINGE | INTRAVENOUS | Status: DC | PRN
  Administered 2021-10-06: 160 ug via INTRAVENOUS

## 2021-10-06 MED ORDER — CHLORHEXIDINE GLUCONATE 4 % EX LIQD: 60.0000 mL | Freq: Once | CUTANEOUS | Status: DC

## 2021-10-06 MED ORDER — STERILE WATER FOR IRRIGATION IR SOLN
Status: DC | PRN
  Administered 2021-10-06: 2000 mL

## 2021-10-06 MED ORDER — LORAZEPAM 2 MG/ML IJ SOLN: 1.0000 mg | INTRAMUSCULAR | Status: AC | PRN

## 2021-10-06 MED ORDER — SODIUM CHLORIDE 0.9 % IV SOLN: INTRAVENOUS | Status: DC

## 2021-10-06 MED ORDER — TRAZODONE HCL 50 MG PO TABS
50.0000 mg | ORAL_TABLET | Freq: Every day | ORAL | Status: DC
  Administered 2021-10-07 – 2021-10-09 (×3): 50 mg via ORAL
  Filled 2021-10-06 (×4): qty 1

## 2021-10-06 MED ORDER — ONDANSETRON HCL 4 MG/2ML IJ SOLN
INTRAMUSCULAR | Status: AC
  Filled 2021-10-06: qty 2

## 2021-10-06 MED ORDER — LEVOTHYROXINE SODIUM 125 MCG PO TABS
125.0000 ug | ORAL_TABLET | Freq: Every day | ORAL | Status: DC
  Administered 2021-10-07 – 2021-10-13 (×7): 125 ug via ORAL
  Filled 2021-10-06 (×7): qty 1

## 2021-10-06 MED ORDER — MIDAZOLAM HCL 2 MG/2ML IJ SOLN
INTRAMUSCULAR | Status: AC
Start: 2021-10-06 — End: ?
  Filled 2021-10-06: qty 2

## 2021-10-06 MED ORDER — TRANEXAMIC ACID-NACL 1000-0.7 MG/100ML-% IV SOLN
1000.0000 mg | INTRAVENOUS | Status: AC
  Administered 2021-10-06: 1000 mg via INTRAVENOUS
  Filled 2021-10-06: qty 100

## 2021-10-06 MED ORDER — FENTANYL CITRATE PF 50 MCG/ML IJ SOSY: 25.0000 ug | PREFILLED_SYRINGE | INTRAMUSCULAR | Status: DC | PRN

## 2021-10-06 MED ORDER — MIDAZOLAM HCL 2 MG/2ML IJ SOLN
INTRAMUSCULAR | Status: DC | PRN
  Administered 2021-10-06: 2 mg via INTRAVENOUS

## 2021-10-06 MED ORDER — BUPIVACAINE LIPOSOME 1.3 % IJ SUSP
INTRAMUSCULAR | Status: AC
  Filled 2021-10-06: qty 20

## 2021-10-06 MED ORDER — THIAMINE HCL 100 MG/ML IJ SOLN
100.0000 mg | Freq: Every day | INTRAMUSCULAR | Status: DC
  Filled 2021-10-06: qty 2

## 2021-10-06 MED ORDER — RIVAROXABAN 10 MG PO TABS: 10.0000 mg | ORAL_TABLET | Freq: Every day | ORAL | 0 refills | Status: DC

## 2021-10-06 MED ORDER — SENNOSIDES-DOCUSATE SODIUM 8.6-50 MG PO TABS: 1.0000 | ORAL_TABLET | Freq: Every evening | ORAL | Status: DC | PRN

## 2021-10-06 MED ORDER — ACETAMINOPHEN 160 MG/5ML PO SOLN: 325.0000 mg | ORAL | Status: DC | PRN

## 2021-10-06 MED ORDER — THIAMINE HCL 100 MG PO TABS
100.0000 mg | ORAL_TABLET | Freq: Every day | ORAL | Status: DC
  Administered 2021-10-06 – 2021-10-13 (×8): 100 mg via ORAL
  Filled 2021-10-06 (×8): qty 1

## 2021-10-06 MED ORDER — LIDOCAINE 2% (20 MG/ML) 5 ML SYRINGE
INTRAMUSCULAR | Status: DC | PRN
  Administered 2021-10-06: 60 mg via INTRAVENOUS

## 2021-10-06 MED ORDER — MEPERIDINE HCL 50 MG/ML IJ SOLN: 6.2500 mg | INTRAMUSCULAR | Status: DC | PRN

## 2021-10-06 MED ORDER — DULOXETINE HCL 30 MG PO CPEP
60.0000 mg | ORAL_CAPSULE | Freq: Every day | ORAL | Status: DC
Start: 2021-10-06 — End: 2021-10-13
  Administered 2021-10-06 – 2021-10-13 (×8): 60 mg via ORAL
  Filled 2021-10-06 (×8): qty 2

## 2021-10-06 MED ORDER — PROPOFOL 10 MG/ML IV BOLUS
INTRAVENOUS | Status: AC
  Filled 2021-10-06: qty 20

## 2021-10-06 MED ORDER — ENSURE ENLIVE PO LIQD
237.0000 mL | Freq: Two times a day (BID) | ORAL | Status: DC
  Administered 2021-10-08 – 2021-10-11 (×5): 237 mL via ORAL

## 2021-10-06 MED ORDER — OXYCODONE HCL 5 MG PO TABS: 5.0000 mg | ORAL_TABLET | Freq: Once | ORAL | Status: DC | PRN

## 2021-10-06 MED ORDER — ROCURONIUM BROMIDE 10 MG/ML (PF) SYRINGE
PREFILLED_SYRINGE | INTRAVENOUS | Status: AC
  Filled 2021-10-06: qty 10

## 2021-10-06 MED ORDER — ADULT MULTIVITAMIN W/MINERALS CH
1.0000 | ORAL_TABLET | Freq: Every day | ORAL | Status: DC
  Administered 2021-10-06 – 2021-10-13 (×8): 1 via ORAL
  Filled 2021-10-06 (×8): qty 1

## 2021-10-06 MED ORDER — DEXAMETHASONE SODIUM PHOSPHATE 10 MG/ML IJ SOLN
INTRAMUSCULAR | Status: AC
  Filled 2021-10-06: qty 1

## 2021-10-06 MED ORDER — POLYETHYLENE GLYCOL 3350 17 G PO PACK: 17.0000 g | PACK | Freq: Every day | ORAL | Status: DC | PRN

## 2021-10-06 MED ORDER — MENTHOL 3 MG MT LOZG: 1.0000 | LOZENGE | OROMUCOSAL | Status: DC | PRN

## 2021-10-06 MED ORDER — HYDROMORPHONE HCL 1 MG/ML IJ SOLN
0.5000 mg | INTRAMUSCULAR | Status: DC | PRN
  Administered 2021-10-06 (×2): 0.5 mg via INTRAVENOUS
  Filled 2021-10-06 (×2): qty 0.5

## 2021-10-06 MED ORDER — HYDROCODONE-ACETAMINOPHEN 5-325 MG PO TABS: 1.0000 | ORAL_TABLET | Freq: Four times a day (QID) | ORAL | Status: DC | PRN

## 2021-10-06 MED ORDER — BUPIVACAINE HCL (PF) 0.25 % IJ SOLN
INTRAMUSCULAR | Status: AC
  Filled 2021-10-06: qty 10

## 2021-10-06 MED ORDER — POVIDONE-IODINE 10 % EX SWAB
2.0000 "application " | Freq: Once | CUTANEOUS | Status: AC
  Administered 2021-10-06: 2 via TOPICAL

## 2021-10-06 MED ORDER — ATORVASTATIN CALCIUM 40 MG PO TABS
80.0000 mg | ORAL_TABLET | Freq: Every evening | ORAL | Status: DC
Start: 2021-10-06 — End: 2021-10-13
  Administered 2021-10-07 – 2021-10-12 (×6): 80 mg via ORAL
  Filled 2021-10-06 (×6): qty 2

## 2021-10-06 MED ORDER — PHENOL 1.4 % MT LIQD
1.0000 | OROMUCOSAL | Status: DC | PRN
  Administered 2021-10-07 (×2): 1 via OROMUCOSAL
  Filled 2021-10-06: qty 177

## 2021-10-06 MED ORDER — OXYCODONE HCL 5 MG/5ML PO SOLN: 5.0000 mg | Freq: Once | ORAL | Status: DC | PRN

## 2021-10-06 MED ORDER — SUGAMMADEX SODIUM 200 MG/2ML IV SOLN
INTRAVENOUS | Status: DC | PRN
  Administered 2021-10-06: 200 mg via INTRAVENOUS

## 2021-10-06 MED ORDER — ACETAMINOPHEN 325 MG PO TABS: 325.0000 mg | ORAL_TABLET | ORAL | Status: DC | PRN

## 2021-10-06 MED ORDER — BUPIVACAINE HCL 0.25 % IJ SOLN
INTRAMUSCULAR | Status: DC | PRN
  Administered 2021-10-06: 10 mL

## 2021-10-06 MED ORDER — PROPOFOL 10 MG/ML IV BOLUS
INTRAVENOUS | Status: DC | PRN
  Administered 2021-10-06: 50 mg via INTRAVENOUS
  Administered 2021-10-06: 100 mg via INTRAVENOUS

## 2021-10-06 MED ORDER — SODIUM CHLORIDE 0.9 % IV SOLN
INTRAVENOUS | Status: AC
Start: 2021-10-06 — End: 2021-10-06

## 2021-10-06 MED ORDER — LORAZEPAM 1 MG PO TABS
1.0000 mg | ORAL_TABLET | ORAL | Status: AC | PRN
  Administered 2021-10-06: 1 mg via ORAL
  Filled 2021-10-06: qty 1

## 2021-10-06 MED ORDER — RIVAROXABAN 10 MG PO TABS
10.0000 mg | ORAL_TABLET | Freq: Every day | ORAL | Status: DC
  Administered 2021-10-07 – 2021-10-13 (×7): 10 mg via ORAL
  Filled 2021-10-06 (×7): qty 1

## 2021-10-06 MED ORDER — LABETALOL HCL 5 MG/ML IV SOLN
INTRAVENOUS | Status: DC | PRN
  Administered 2021-10-06: 5 mg via INTRAVENOUS

## 2021-10-06 MED ORDER — ONDANSETRON HCL 4 MG/2ML IJ SOLN
4.0000 mg | Freq: Four times a day (QID) | INTRAMUSCULAR | Status: DC | PRN
  Administered 2021-10-06: 4 mg via INTRAVENOUS
  Filled 2021-10-06: qty 2

## 2021-10-06 MED ORDER — BUPIVACAINE LIPOSOME 1.3 % IJ SUSP
INTRAMUSCULAR | Status: AC
  Filled 2021-10-06: qty 10

## 2021-10-06 MED ORDER — INSULIN ASPART 100 UNIT/ML IJ SOLN
0.0000 [IU] | INTRAMUSCULAR | Status: DC
  Administered 2021-10-06: 3 [IU] via SUBCUTANEOUS
  Administered 2021-10-06: 5 [IU] via SUBCUTANEOUS
  Administered 2021-10-06: 1 [IU] via SUBCUTANEOUS
  Administered 2021-10-07: 2 [IU] via SUBCUTANEOUS
  Administered 2021-10-07: 1 [IU] via SUBCUTANEOUS
  Administered 2021-10-07: 3 [IU] via SUBCUTANEOUS
  Administered 2021-10-07: 2 [IU] via SUBCUTANEOUS
  Administered 2021-10-07: 1 [IU] via SUBCUTANEOUS
  Administered 2021-10-07: 3 [IU] via SUBCUTANEOUS
  Administered 2021-10-08 (×3): 2 [IU] via SUBCUTANEOUS
  Administered 2021-10-08: 3 [IU] via SUBCUTANEOUS
  Administered 2021-10-08: 2 [IU] via SUBCUTANEOUS
  Administered 2021-10-08: 5 [IU] via SUBCUTANEOUS
  Administered 2021-10-09: 2 [IU] via SUBCUTANEOUS
  Administered 2021-10-09: 5 [IU] via SUBCUTANEOUS
  Administered 2021-10-09: 7 [IU] via SUBCUTANEOUS
  Administered 2021-10-09: 3 [IU] via SUBCUTANEOUS
  Administered 2021-10-09 (×2): 2 [IU] via SUBCUTANEOUS
  Administered 2021-10-10: 5 [IU] via SUBCUTANEOUS
  Administered 2021-10-10: 2 [IU] via SUBCUTANEOUS
  Administered 2021-10-10: 3 [IU] via SUBCUTANEOUS
  Filled 2021-10-06: qty 0.09

## 2021-10-06 MED ORDER — ONDANSETRON HCL 4 MG/2ML IJ SOLN: 4.0000 mg | Freq: Once | INTRAMUSCULAR | Status: DC | PRN

## 2021-10-06 MED ORDER — FENTANYL CITRATE PF 50 MCG/ML IJ SOSY
25.0000 ug | PREFILLED_SYRINGE | Freq: Once | INTRAMUSCULAR | Status: AC
  Administered 2021-10-06: 25 ug via INTRAVENOUS
  Filled 2021-10-06: qty 1

## 2021-10-06 MED ORDER — FENTANYL CITRATE (PF) 100 MCG/2ML IJ SOLN
INTRAMUSCULAR | Status: DC | PRN
  Administered 2021-10-06 (×6): 50 ug via INTRAVENOUS

## 2021-10-06 MED ORDER — FOLIC ACID 1 MG PO TABS
1.0000 mg | ORAL_TABLET | Freq: Every day | ORAL | Status: DC
  Administered 2021-10-06 – 2021-10-13 (×8): 1 mg via ORAL
  Filled 2021-10-06 (×8): qty 1

## 2021-10-06 MED ORDER — PHENYLEPHRINE 80 MCG/ML (10ML) SYRINGE FOR IV PUSH (FOR BLOOD PRESSURE SUPPORT)
PREFILLED_SYRINGE | INTRAVENOUS | Status: AC
  Filled 2021-10-06: qty 10

## 2021-10-06 MED ORDER — SUCCINYLCHOLINE CHLORIDE 200 MG/10ML IV SOSY
PREFILLED_SYRINGE | INTRAVENOUS | Status: AC
  Filled 2021-10-06: qty 10

## 2021-10-06 SURGICAL SUPPLY — 45 items
BAG COUNTER SPONGE SURGICOUNT (BAG) ×1 IMPLANT
BAG ZIPLOCK 12X15 (MISCELLANEOUS) ×3 IMPLANT
BLADE SAW SGTL 18X1.27X75 (BLADE) ×2 IMPLANT
BLADE SURG SZ10 CARB STEEL (BLADE) ×4 IMPLANT
CELLS DAT CNTRL 66122 CELL SVR (MISCELLANEOUS) IMPLANT
CHLORAPREP W/TINT 26 (MISCELLANEOUS) ×2 IMPLANT
COVER PERINEAL POST (MISCELLANEOUS) ×2 IMPLANT
COVER SURGICAL LIGHT HANDLE (MISCELLANEOUS) ×2 IMPLANT
CUP ACET PINNACLE SECTR 48MM (Joint) IMPLANT
DRAPE C-ARM 42X120 X-RAY (DRAPES) ×2 IMPLANT
DRAPE STERI IOBAN 125X83 (DRAPES) ×2 IMPLANT
DRAPE U-SHAPE 47X51 STRL (DRAPES) ×6 IMPLANT
DRSG MEPILEX BORDER 4X8 (GAUZE/BANDAGES/DRESSINGS) ×2 IMPLANT
ELECT BLADE TIP CTD 4 INCH (ELECTRODE) ×4 IMPLANT
ELECT REM PT RETURN 15FT ADLT (MISCELLANEOUS) ×2 IMPLANT
ELIMINATOR HOLE APEX DEPUY (Hips) ×1 IMPLANT
FACESHIELD WRAPAROUND (MASK) ×8 IMPLANT
FACESHIELD WRAPAROUND OR TEAM (MASK) ×4 IMPLANT
GLOVE BIO SURGEON STRL SZ7.5 (GLOVE) ×2 IMPLANT
GLOVE BIOGEL PI IND STRL 8 (GLOVE) ×2 IMPLANT
GLOVE BIOGEL PI INDICATOR 8 (GLOVE) ×2
GLOVE ECLIPSE 7.0 STRL STRAW (GLOVE) ×2 IMPLANT
GLOVE ORTHO TXT STRL SZ7.5 (GLOVE) ×2 IMPLANT
GOWN STRL REUS W/ TWL XL LVL3 (GOWN DISPOSABLE) ×2 IMPLANT
GOWN STRL REUS W/TWL XL LVL3 (GOWN DISPOSABLE) ×2
HEAD FEM STD 32X+1 STRL (Hips) ×1 IMPLANT
KIT BASIN OR (CUSTOM PROCEDURE TRAY) ×2 IMPLANT
KIT TURNOVER KIT A (KITS) ×1 IMPLANT
PACK ANTERIOR HIP CUSTOM (KITS) ×2 IMPLANT
PADDING CAST COTTON 6X4 STRL (CAST SUPPLIES) ×2 IMPLANT
PENCIL SMOKE EVACUATOR (MISCELLANEOUS) IMPLANT
PINN ALTRX NEUT ID X OD 32X48 ×1 IMPLANT
PINNSECTOR W/GRIP ACE CUP 48MM (Joint) ×2 IMPLANT
RETRACTOR WND ALEXIS 18 MED (MISCELLANEOUS) ×1 IMPLANT
RTRCTR WOUND ALEXIS 18CM MED (MISCELLANEOUS)
SCREW PINN CAN 6.5X20 (Screw) ×1 IMPLANT
STAPLER VISISTAT 35W (STAPLE) IMPLANT
STEM FEMORAL SZ 6MM STD ACTIS (Stem) ×1 IMPLANT
SUT VIC AB 0 CT1 36 (SUTURE) ×2 IMPLANT
SUT VIC AB 2-0 CT1 27 (SUTURE) ×2
SUT VIC AB 2-0 CT1 TAPERPNT 27 (SUTURE) ×2 IMPLANT
SUT VLOC 180 0 24IN GS25 (SUTURE) ×2 IMPLANT
TOWEL OR 17X26 10 PK STRL BLUE (TOWEL DISPOSABLE) ×4 IMPLANT
TOWEL OR NON WOVEN STRL DISP B (DISPOSABLE) ×2 IMPLANT
TRAY FOLEY MTR SLVR 16FR STAT (SET/KITS/TRAYS/PACK) ×2 IMPLANT

## 2021-10-06 NOTE — Assessment & Plan Note (Signed)
Reports drinking at least a bottle of wine and several beers daily.  Last drink night of admission prior to fall. ?-Placed on CIWA protocol with Ativan as needed ?

## 2021-10-06 NOTE — Transfer of Care (Signed)
Immediate Anesthesia Transfer of Care Note ? ?Patient: Neziah Vogelgesang ? ?Procedure(s) Performed: TOTAL HIP ARTHROPLASTY ANTERIOR APPROACH (Left: Hip) ? ?Patient Location: PACU ? ?Anesthesia Type:General ? ?Level of Consciousness: drowsy ? ?Airway & Oxygen Therapy: Patient Spontanous Breathing and Patient connected to face mask ? ?Post-op Assessment: Report given to RN and Post -op Vital signs reviewed and stable ? ?Post vital signs: Reviewed and stable ? ?Last Vitals:  ?Vitals Value Taken Time  ?BP 107/44 10/06/21 1634  ?Temp    ?Pulse 70 10/06/21 1634  ?Resp 14 10/06/21 1635  ?SpO2 88 % 10/06/21 1634  ?Vitals shown include unvalidated device data. ? ?Last Pain:  ?Vitals:  ? 10/06/21 1355  ?TempSrc: Oral  ?PainSc:   ?   ? ?Patients Stated Pain Goal: 0 (10/06/21 0558) ? ?Complications: No notable events documented. ?

## 2021-10-06 NOTE — ED Notes (Signed)
Pt drinking water 

## 2021-10-06 NOTE — Assessment & Plan Note (Signed)
Continue atorvastatin

## 2021-10-06 NOTE — Progress Notes (Signed)
Initial Nutrition Assessment ? ?DOCUMENTATION CODES:  ? ?Non-severe (moderate) malnutrition in context of chronic illness ? ?INTERVENTION:  ? ?-Ensure Plus High Protein po BID, each supplement provides 350 kcal and 20 grams of protein.  ? ?-Multivitamin with minerals daily ? ?NUTRITION DIAGNOSIS:  ? ?Moderate Malnutrition related to chronic illness (alcohol abuse) as evidenced by mild fat depletion, mild muscle depletion. ? ?GOAL:  ? ?Patient will meet greater than or equal to 90% of their needs ? ?MONITOR:  ? ?PO intake, Supplement acceptance, Labs, Weight trends, I & O's ? ?REASON FOR ASSESSMENT:  ? ?Consult ?Hip fracture protocol ? ?ASSESSMENT:  ? ?81 year old female with type 2 diabetes hypertension history of alcohol consumption with occasional falls asleep in a restaurant and fell in the parking lot does not think she tripped over anything. ? ?Patient currently NPO, awaiting surgery, planned for later this afternoon. Pt reports eating well PTA, eats a late breakfast and goes out to eat for dinner most nights. ?Per chart review, pt drinks alcohol daily (1 bottle of wine, beers). ?Pt agreeable to receiving Ensure supplements following diet advancement. ? ?Pt reports she has lost some weight but lost most of her weight last year when she had COVID. Per weight records, pt with no recent weight loss. ? ?Medications: Folic acid, Multivitamin with minerals daily, Thiamine, IV Zofran ? ?Labs reviewed: ? CBGs: 196-222 ? ?NUTRITION - FOCUSED PHYSICAL EXAM: ? ?Flowsheet Row Most Recent Value  ?Orbital Region Mild depletion  ?Upper Arm Region No depletion  ?Thoracic and Lumbar Region No depletion  ?Buccal Region Mild depletion  ?Temple Region No depletion  ?Clavicle Bone Region Mild depletion  ?Clavicle and Acromion Bone Region Mild depletion  ?Scapular Bone Region Mild depletion  ?Dorsal Hand Mild depletion  ?Patellar Region Unable to assess  ?Anterior Thigh Region Unable to assess  ?Posterior Calf Region Unable to  assess  ?Edema (RD Assessment) None  ?Hair Reviewed  ?Eyes Reviewed  ?Mouth Reviewed  ?Skin Reviewed  ? ?  ? ? ?Diet Order:   ?Diet Order   ? ?       ?  Diet NPO time specified  Diet effective ____       ?  ?  Diet NPO time specified  Diet effective now       ?  ? ?  ?  ? ?  ? ? ?EDUCATION NEEDS:  ? ?Education needs have been addressed ? ?Skin:  Skin Assessment: Reviewed RN Assessment ? ?Last BM:  5/2 ? ?Height:  ? ?Ht Readings from Last 1 Encounters:  ?10/05/21 '5\' 8"'$  (1.727 m)  ? ? ?Weight:  ? ?Wt Readings from Last 1 Encounters:  ?10/05/21 73.1 kg  ? ? ?BMI:  24 kg/m^2 ? ?Estimated Nutritional Needs:  ? ?Kcal:  1900-2100 ? ?Protein:  85-100g ? ?Fluid:  2L/day ? ?Clayton Bibles, MS, RD, LDN ?Inpatient Clinical Dietitian ?Contact information available via Amion ? ?

## 2021-10-06 NOTE — Anesthesia Preprocedure Evaluation (Addendum)
Anesthesia Evaluation  ?Patient identified by MRN, date of birth, ID band ?Patient awake ? ? ? ?Reviewed: ?Allergy & Precautions, NPO status , Patient's Chart, lab work & pertinent test results ? ?Airway ?Mallampati: I ? ?TM Distance: >3 FB ?Neck ROM: Full ? ? ? Dental ?no notable dental hx. ?(+) Dental Advisory Given, Edentulous Upper,  ?  ?Pulmonary ?neg pulmonary ROS, former smoker,  ?  ?Pulmonary exam normal ?breath sounds clear to auscultation ? ? ? ? ? ? Cardiovascular ?hypertension, Pt. on medications ?Normal cardiovascular exam ?Rhythm:Regular Rate:Normal ? ? ?  ?Neuro/Psych ?PSYCHIATRIC DISORDERS Anxiety Depression Schizophrenia TIAnegative neurological ROS ? negative psych ROS  ? GI/Hepatic ?Neg liver ROS, GERD  Medicated,  ?Endo/Other  ?diabetes, Type 2, Oral Hypoglycemic AgentsHypothyroidism  ? Renal/GU ?negative Renal ROS  ? ?  ?Musculoskeletal ?negative musculoskeletal ROS ?(+)  ? Abdominal ?  ?Peds ? Hematology ?negative hematology ROS ?(+)   ?Anesthesia Other Findings ? ? Reproductive/Obstetrics ?negative OB ROS ? ?  ? ? ? ? ? ? ? ? ? ? ? ? ? ?  ?  ? ? ? ? ? ? ? ?Anesthesia Physical ? ?Anesthesia Plan ? ?ASA: 3 and emergent ? ?Anesthesia Plan: General  ? ?Post-op Pain Management: Minimal or no pain anticipated  ? ?Induction: Intravenous ? ?PONV Risk Score and Plan: 3 and Ondansetron, Dexamethasone, Propofol infusion and Treatment may vary due to age or medical condition ? ?Airway Management Planned: Oral ETT ? ?Additional Equipment: None ? ?Intra-op Plan:  ? ?Post-operative Plan: Extubation in OR ? ?Informed Consent: I have reviewed the patients History and Physical, chart, labs and discussed the procedure including the risks, benefits and alternatives for the proposed anesthesia with the patient or authorized representative who has indicated his/her understanding and acceptance.  ? ? ? ?Dental advisory given ? ?Plan Discussed with: CRNA and  Anesthesiologist ? ?Anesthesia Plan Comments: (? ?Marissa Hall is a 81 y.o. female with medical history significant for T2DM, HTN, HLD, hypothyroidism, history of DVT (s/p 6 months Xarelto), recurrent hyperkalemia, recurrent syncope, history of CVA, depression/anxiety, and alcohol use who presented to the ED for evaluation of left hip pain after a fall. ? ?She reports a history of recurrent syncopal type episodes, usually occurring when she is drinking alcohol.  She had prior work-up with cardiac event monitor which she wore for a month from November to December 2022.  This showed rare PACs and PVCs.  Follows with cardiology, Dr. Bettina Gavia.)  ? ? ? ? ? ?Anesthesia Quick Evaluation ? ?

## 2021-10-06 NOTE — Telephone Encounter (Signed)
-----   Message from Tammi Sou, MD sent at 10/05/2021 10:42 PM EDT ----- ?Potassium elevated. ?Cut valsartan tab in half daily. ?Recheck nonfasting bmet 1 wk, dx hyperkalemia and elevated serum creatinine. ?

## 2021-10-06 NOTE — Assessment & Plan Note (Signed)
Outpatient labs showed potassium 5.8 prior to admission.  Repeat potassium 4.2.  Hold ARB with recurrent hyperkalemia. ?

## 2021-10-06 NOTE — TOC Initial Note (Signed)
Transition of Care (TOC) - Initial/Assessment Note  ? ? ?Patient Details  ?Name: Marissa Hall ?MRN: [id number] ?Date of Birth: 03-10-1941 ? ?Transition of Care (TOC) CM/SW Contact:    ?Tawanna Cooler, RN ?Phone Number: ?10/06/2021, 10:53 AM ? ?Clinical Narrative:                 ? ?Patient from home alone.  No local family per notes.   ?Plan is hip repair today.   ?Pending PT/OT eval after surgery to determine discharge disposition.  ?TOC following.  ? ? ?Expected Discharge Plan: [place] ?Barriers to Discharge: Continued Medical Work up ? ? ?Expected Discharge Plan and Services ?Expected Discharge Plan: [place] ?  ?   ?Living arrangements for the past 2 months: [place] ?                ? Prior Living Arrangements/Services ?Living arrangements for the past 2 months: [place] ?Lives with:: Self, Pets ?Patient language and need for interpreter reviewed:: Yes ?       ?Need for Family Participation in Patient Care: No (Comment) ?Care giver support system in place?: No (comment) ?  ?Criminal Activity/Legal Involvement Pertinent to Current Situation/Hospitalization: No - Comment as needed ? ?Activities of Daily Living ?Home Assistive Devices/Equipment: Eyeglasses ?ADL Screening (condition at time of admission) ?Patient's cognitive ability adequate to safely complete daily activities?: Yes ?Is the patient deaf or have difficulty hearing?: No ?Does the patient have difficulty seeing, even when wearing glasses/contacts?: No ?Does the patient have difficulty concentrating, remembering, or making decisions?: No ?Patient able to express need for assistance with ADLs?: Yes ?Does the patient have difficulty dressing or bathing?: Yes ?Independently performs ADLs?: No ?Communication: Independent ?Dressing (OT): Needs assistance ?Is this a change from baseline?: Change from baseline, expected to last <3days ?Grooming: Needs assistance ?Is this a change from baseline?: Change  from baseline, expected to last <3 days ?Feeding: Independent ?Bathing: Needs assistance ?Is this a change from baseline?: Change from baseline, expected to last <3 days ?Toileting: Needs assistance ?Is this a change from baseline?: Change from baseline, expected to last <3 days ?In/Out Bed: Needs assistance ?Is this a change from baseline?: Change from baseline, expected to last <3 days ?Walks in Home: Needs assistance ?Is this a change from baseline?: Change from baseline, expected to last <3 days ?Does the patient have difficulty walking or climbing stairs?: Yes ?Weakness of Legs: Left ?Weakness of Arms/Hands: None ? ?   ? ?Emotional Assessment ?  ? Orientation: : Oriented to Self, Oriented to Place, Oriented to  Time, Oriented to Situation ?Alcohol / Substance Use: Alcohol Use ?Psych Involvement: No (comment) ? ?Admission diagnosis:  Closed fracture of neck of left femur, initial encounter ([place]) [S72.002A] ?Closed fracture of proximal end of left femur, initial encounter ([place]) [S72.002A] ?Patient Active Problem List  ? Diagnosis Date Noted  ? Closed fracture of proximal end of left femur, initial encounter ([place]) 10/06/2021  ? Hyperkalemia 10/06/2021  ? Alcohol use 10/06/2021  ? Syncope and collapse 06/10/2021  ? Closed fracture of multiple ribs of right side 04/30/2021  ? Acquired hypothyroidism 02/23/2021  ? Type 2 diabetes mellitus with diabetic polyneuropathy, without long-term current use of insulin ([place]) 02/23/2021  ? 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  ? Diabetes mellitus type 2 without retinopathy ([place]) 09/21/2020  ? Meibomian gland dysfunction (MGD) of both eyes 09/21/2020  ? History of COVID-19 06/23/2020  ? COVID-19 virus infection 05/27/2020  ? Piriformis syndrome of right  side 05/16/2019  ? Lower extremity pain, right 04/23/2019  ? DVT (deep venous thrombosis) ([place]) 04/23/2019  ? Lumbar radiculopathy, right 04/02/2019  ? Cataracts, bilateral 07/2017  ? Other long term (current) drug therapy 06/14/2017   ? Anxiety and depression   ? History of breast cancer 03/02/2017  ? History of therapeutic radiation 03/02/2017  ? Breast pain, right 10/03/2016  ? Type 2 diabetes mellitus ([place]) 08/23/2016  ? Hypertension associated with diabetes ([place]) 08/23/2016  ? Hypothyroidism 08/23/2016  ? Chemotherapy-induced neuropathy ([place]) 07/04/2016  ? Port catheter in place 05/09/2016  ? Peripheral neuropathy 05/09/2016  ? Breast cancer of upper-inner quadrant of right female breast ([place]) 03/09/2016  ? Major depressive disorder, recurrent ([place]) 02/27/2014  ? Uncomplicated alcohol dependence ([place]) 02/19/2014  ? Osteoporosis 2015  ? GERD (gastroesophageal reflux disease) 2013  ? TIA (transient ischemic attack) 03/26/2011  ? Epidermoid cyst of vulva 2008  ? Hyperlipidemia associated with type 2 diabetes mellitus ([place]) 1986  ? ?PCP:  Tammi Sou, MD ?Pharmacy:   ?[facility], [address] ?[place] [address] ?Phone: [phone] Fax: [phone] ? ?[place] [address] ?Phone: [phone] Fax: [phone] ? ? ?

## 2021-10-06 NOTE — Progress Notes (Signed)
Patient ID: Marissa Hall, female   DOB: 03-31-1941, 81 y.o.   MRN: [id number] ?Discussion with patient for treatment options for femoral neck fracture left.  She lives alone has dog son lives in [place].  Plan left total hip arthroplasty 5 PM direct anterior approach.  Risk discussed full consult to follow. ?

## 2021-10-06 NOTE — Interval H&P Note (Signed)
History and Physical Interval Note: ? ?10/06/2021 ?2:18 PM ? ?Marissa Hall  has presented today for surgery, with the diagnosis of left femoral neck fracture.  The various methods of treatment have been discussed with the patient and family. After consideration of risks, benefits and other options for treatment, the patient has consented to  Procedure(s): ?TOTAL HIP ARTHROPLASTY ANTERIOR APPROACH (Left) as a surgical intervention.  The patient's history has been reviewed, patient examined, no change in status, stable for surgery.  I have reviewed the patient's chart and labs.  Questions were answered to the patient's satisfaction.   ? ? ?[name] ? ? ?

## 2021-10-06 NOTE — Hospital Course (Addendum)
Marissa Hall is a 81 y.o. female with past medical history of type 2 diabetes, hypertension, hyperlipidemia, hypothyroidism, history of DVT status post 6 months of Xarelto, recurrent hyperkalemia, recurrent syncope, history of CVA, depression and anxiety and alcohol use disorder presented to hospital after sustaining a syncopal event and hurting her hip.  Has history of wearing a monitor for recurrent syncope in the past and follows up with Dr. Bettina Gavia cardiology as outpatient with mention of PACs and some PVCs.  Patient  stated that she drinks too much alcohol at home. In the ED, patient was noted to have stable vitals.  WBC was 8.8 hemoglobin of 13.1.  Creatinine 0.6.  CT head was negative for fracture.  CT pelvis showed acute fracture of the proximal left femur.  CT C-spine with multilevel degenerative changes.  Orthopedics was consulted from the ED and patient was admitted hospital for further evaluation and treatment. ? ?Assessment and Plan: ? ?Principal Problem: ?  Closed fracture of proximal end of left femur, initial encounter ([place]) ?Active Problems: ?  Syncope and collapse ?  Hyperkalemia ?  Alcohol use ?  Diabetes mellitus type 2 without retinopathy ([place]) ?  Hypertension associated with diabetes ([place]) ?  Anxiety and depression ?  Hypothyroidism ?  Hyperlipidemia associated with type 2 diabetes mellitus ([place]) ?  Malnutrition of moderate degree ?  ?Closed fracture of proximal end of left femur, initial encounter ([place]) ?Patient was seen by orthopedics and underwent total hip arthroplasty on 10/06/2021.  On Xarelto for DVT prophylaxis.    Patient to follow-up with Dr. Lorin Mercy orthopedics in 2 weeks postop ? ?Syncope and collapse ?History of previous syncopes and cardiac event monitor in the past showing rare PACs and PVCs.  Follows up with Dr. Bettina Gavia as outpatient.   Will need to follow-up with cardiology as outpatient. ? ?Alcohol use order ?Reports drinking at least a bottle of wine and several beers daily.   Last drink night of admission prior to fall.  Patient did not have any obvious signs of withdrawal during hospitalization.  Will prescribe thiamine folic acid and multivitamin on discharge. ? ?Diabetes mellitus type 2 without retinopathy ([place]) ?Patient received sliding scale insulin, Accu-Cheks diabetic diet while in the hospital.  Takes metformin at home.  Latest POC glucose of 191.  Will resume metformin after discharge. ?  ?Hypertension  ? Takes Coreg, valsartan and hydralazine at home.  Was orthostatic with PT so home medications have been reduced.  Dose of Coreg has been decreased to half including valsartan doses.  Hydralazine has been discontinued.  Please readjust medication as necessary as outpatient  ? ?Hyperlipidemia associated with type 2 diabetes mellitus ([place]) ?Continue Lipitor from home.. ?  ?Hypothyroidism ?Continue Synthroid. ?  ?Anxiety and depression ?Continue Cymbalta and trazodone. ? ?Moderate protein calorie malnutrition.  Present on admission.  Seen by dietitian during hospitalization.  Continue nutritional supplements on discharge. ?

## 2021-10-06 NOTE — Anesthesia Procedure Notes (Signed)
Procedure Name: Intubation ?Date/Time: 10/06/2021 2:44 PM ?Performed by: Niel Hummer, CRNA ?Pre-anesthesia Checklist: Patient identified and Emergency Drugs available ?Patient Re-evaluated:Patient Re-evaluated prior to induction ?Oxygen Delivery Method: Circle system utilized ?Preoxygenation: Pre-oxygenation with 100% oxygen ?Induction Type: IV induction ?Ventilation: Mask ventilation without difficulty ?Laryngoscope Size: Mac and 3 ?Grade View: Grade I ?Tube type: Oral ?Tube size: 7.0 mm ?Number of attempts: 1 ?Airway Equipment and Method: Stylet ?Placement Confirmation: ETT inserted through vocal cords under direct vision ?Secured at: 22 cm ?Tube secured with: Tape ?Dental Injury: Teeth and Oropharynx as per pre-operative assessment  ? ? ? ? ?

## 2021-10-06 NOTE — Consult Note (Signed)
? ?Reason for Consult:lfall for left femoral neck fracture, closed.  ?Referring Physician: Flora Lipps MD  ? ?Marissa Hall is an 81 y.o. female.  ?HPI: 81 year old female with type 2 diabetes hypertension history of alcohol consumption with occasional falls asleep in a restaurant and fell in the parking lot does not think she tripped over anything.  No loss of consciousness no head injury.  She is unable to ambulate and x-rays CT scan demonstrated displaced femoral neck fracture of her left hip.  She had history of DVT and was on Xarelto for 6 months now off Xarelto.  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  She has dogs and has a son who lives in [place].  Cardiologist is Dr. Bettina Gavia.  Past history of rare PACs and occasional PVCs. ? ?Past Medical History:  ?Diagnosis Date  ? Acute DVT (deep venous thrombosis) ([place]) 04/23/2019  ? Right popliteal  ? Alcoholism ([place])   ? Anxiety and depression 1964  ? oncologist started duloxetine 12/2016  ? Breast cancer ([place]) 03/14/2016  ? Clinical stage 2A: (triple neg): Right breast, upper inner quadrant, 03/2016.  Neoadjuvant chemo x 5 cycles,lumpectomy 4 mo later, then RT started 10/2016.  Adjuvant Xeloda [id number].  SWOG research trial pt 04/2017--pt randomized to pembrolizumab immunotherapy.  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  ? Cataracts, bilateral 07/2017  ? 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 feet; responding well to cymbalta  ? COVID-19 virus infection 05/27/2020  ? Depression 1964  ? Patient states since age 22  ? Diabetes mellitus with complication [facility]) 2774  ? managed by endocrinology.  A1c Mar 12, 2018 was 7.0% at Dr. Shirlyn Goltz.   ? Epidermoid cyst of vulva   ? Chronic epidermoid cyst of the vulva.  Excision done 02/2019  ? GERD (gastroesophageal reflux disease) 2013  ? Hyperlipidemia 1986  ? Hypertension 2008  ? 2022 ->addition of  hctz to lisinopril led to 59m drop in GFR. HCTZ d/cd.  ? Hypothyroidism 1988  ? Diagnosed in her 464s  Managed by Endocrinologist  ? Lumbar radiculopathy 04/2019  ? Dr. STamala Julianto get plain films of LB and hip (considering MRI due to her hx of cancer)  ? [facility]mic [facility]   ? Hx of takotsubo CM  ? Osteoporosis 2015  ? pt states "osteopenia", but then says that she refused to take the rx med for this condition, so I suspect she had osteoporosis.  ? Peripheral neuropathy 2017  ? Patient states diabetic neuropathy in feet prior to starting chemotherapy and then worsened by chemo.   ? Syncope and collapse   ? [place]  No prodrome.  Monitor ordered and cardiology referral ordered 05/14/21  ? TIA (transient ischemic attack) 03/26/2011  ? 2012: question of (HA + R eye "floaters"). CT in ED neg acute. Not admitted, no f/u testing done.  ?ocular migraine?  ? ? ?Past Surgical History:  ?Procedure Laterality Date  ? ABDOMINAL HYSTERECTOMY  1972  ? APPENDECTOMY  1972  ? [place] ? BREAST IMPLANT REMOVAL Right 09/13/2016  ? Procedure: REMOVAL RIGHT BREAST IMPLANT;  Surgeon: BIrene Limbo MD;  [place]  Service: Plastics;  Laterality: Right;  ? BREAST LUMPECTOMY WITH RADIOACTIVE SEED AND SENTINEL LYMPH NODE BIOPSY Right 09/13/2016  ? Procedure: RIGHT BREAST LUMPECTOMY WITH RADIOACTIVE SEED X 2 AND SENTINEL LYMPH  NODE BIOPSY;  Surgeon: Alphonsa Overall, MD;  Location: [place];  Service: General;  Laterality: Right;  ? BREAST SURGERY Right 03/14/2016  ? Biopsy  ? CAPSULECTOMY Right 09/13/2016  ? Procedure: RIGHT CAPSULECTOMY;  Surgeon: Irene Limbo, MD;  Location: [place];  Service: Plastics;  Laterality: Right;  ? CATARACT EXTRACTION, BILATERAL Bilateral 08/10/17 right eye, 08/31/17 left eye  ? MASS EXCISION Left 02/28/2018  ? Path: benign.  Procedure: EXCISIONLEFT MEDIAL THIGH MASS ERAS PATHWAY;  Surgeon:  Erroll Luna, MD;  Location: [place];  Service: General;  Laterality: Left;  ? PORTACATH PLACEMENT N/A 03/15/2016  ? Procedure: INSERTION PORT-A-CATH WITH [place];  Surgeon: Alphonsa Overall, MD;  Location: [facility];  Service: General;  Laterality: N/A;  ? PORTACATH REMOVAL  07/2017  ? SLEEP STUDY  09/2021  ? NO SLEEP APNEA  ? surgical repair left ankle Left 2009  ? s/p Fall   ? [place]  ? Age 63  ? US CAROTID DOPPLER BILATERAL ([place] [facility])  01/2021  ? <50% bilat int carotid sten, otherwise normal.  ? ? ?Family History  ?Problem Relation Age of Onset  ? Stroke Mother   ? Suicidality Father   ? Stroke Brother   ? Stroke Son   ? Sleep apnea Son   ? ? ?Social History:  reports that she quit smoking about 22 years ago. Her smoking use included cigarettes. She smoked an average of 1 pack per day. She has never used smokeless tobacco. She reports current alcohol use of about 7.0 standard drinks per week. She reports that she does not use drugs. ? ?Allergies:  ?Allergies  ?Allergen Reactions  ? Other Other (See Comments)  ?  STEROIDS- emotional  ? Prednisone Other (See Comments)  ?  Other reaction(s): Mental Status Changes (intolerance)  ? Amlodipine Other (See Comments)  ?  Elevated creatinine  ? Hydrochlorothiazide Other (See Comments)  ?  Elevated serum creatinine  ? Penicillins Other (See Comments)  ?  Unsure of reaction, was 81 years old  ? ? ?Medications: I have reviewed the patient's current medications. ? ?Results for orders placed or performed during the hospital encounter of 10/05/21 (from the past 48 hour(s))  ?CBC     Status: None  ? Collection Time: 10/06/21 12:46 AM  ?Result Value Ref Range  ? WBC 8.8 4.0 - 10.5 K/uL  ? RBC 3.99 3.87 - 5.11 MIL/uL  ? Hemoglobin 13.1 12.0 - 15.0 g/dL  ? HCT 39.0 36.0 - 46.0 %  ? MCV 97.7 80.0 - 100.0 fL  ? MCH 32.8 26.0 - 34.0 pg  ? MCHC 33.6 30.0 - 36.0 g/dL  ? RDW 12.9 11.5 - 15.5 %  ? Platelets 165 150 - 400 K/uL  ? nRBC 0.0 0.0 - 0.2 %  ?   Comment: Performed at [facility], [place] [address], [place], [place] [address]  ?Basic metabolic panel     Status: Abnormal  ? Collection Time: 10/06/21 12:46 AM  ?Result Value Ref Range  ? Sodium 133 (L) 135 - 145 mmol/L  ? Potassium 4.2 3.5 - 5.1 mmol/L  ? Chloride 100 98 - 111 mmol/L  ? CO2 21 (L) 22 - 32 mmol/L  ? Glucose, Bld 228 (H) 70 - 99 mg/dL  ?  Comment: Glucose reference range applies only to samples taken after fasting for at least 8 hours.  ? BUN 19 8 - 23 mg/dL  ? Creatinine, Ser 0.65  0.44 - 1.00 mg/dL  ? Calcium 9.1 8.9 - 10.3 mg/dL  ? GFR, Estimated >60 >60 mL/min  ?  Comment: (NOTE) ?Calculated using the CKD-EPI Creatinine Equation (2021) ?  ? Anion gap 12 5 - 15  ?  Comment: Performed at [facility], [place] [address], [place], [place] [address]  ?ABO/Rh     Status: None  ? Collection Time: 10/06/21 12:46 AM  ?Result Value Ref Range  ? ABO/RH(D)    ?  A POS ?Performed at [facility], [place] [address] [place], [place] [address] ?  ?Ethanol     Status: Abnormal  ? Collection Time: 10/06/21  1:10 AM  ?Result Value Ref Range  ? Alcohol, Ethyl (B) 160 (H) <10 mg/dL  ?  Comment: (NOTE) ?Lowest detectable limit for serum alcohol is 10 mg/dL. ? ?For medical purposes only. ?Performed at [facility], [address] ?[place], [place] [address] ?  ?Glucose, capillary     Status: Abnormal  ? Collection Time: 10/06/21  2:15 AM  ?Result Value Ref Range  ? Glucose-Capillary 228 (H) 70 - 99 mg/dL  ?  Comment: Glucose reference range applies only to samples taken after fasting for at least 8 hours.  ?Glucose, capillary     Status: Abnormal  ? Collection Time: 10/06/21  4:47 AM  ?Result Value Ref Range  ? Glucose-Capillary 137 (H) 70 - 99 mg/dL  ?  Comment: Glucose reference range applies only to samples taken after fasting for at least 8 hours.  ?Type and screen [place]     Status: None  ? Collection Time: 10/06/21   4:48 AM  ?Result Value Ref Range  ? ABO/RH(D) A POS   ? Antibody Screen NEG   ? Sample Expiration    ?  10/09/2021,2359 ?Performed at [facility], [place] [address] [place], [place] [address]

## 2021-10-06 NOTE — Anesthesia Postprocedure Evaluation (Signed)
Anesthesia Post Note ? ?Patient: Marissa Hall ? ?Procedure(s) Performed: TOTAL HIP ARTHROPLASTY ANTERIOR APPROACH (Left: Hip) ? ?  ? ?Patient location during evaluation: PACU ?Anesthesia Type: General ?Level of consciousness: awake and alert ?Pain management: pain level controlled ?Vital Signs Assessment: post-procedure vital signs reviewed and stable ?Respiratory status: spontaneous breathing, nonlabored ventilation, respiratory function stable and patient connected to nasal cannula oxygen ?Cardiovascular status: stable and blood pressure returned to baseline ?Postop Assessment: no apparent nausea or vomiting ?Anesthetic complications: no ? ? ?No notable events documented. ? ?Last Vitals:  ?Vitals:  ? 10/06/21 1800 10/06/21 2032  ?BP: 135/65 (!) 155/65  ?Pulse: 76 81  ?Resp: 16 17  ?Temp: (!) 36.4 ?C 36.6 ?C  ?SpO2: (!) 89% 97%  ?  ?Last Pain:  ?Vitals:  ? 10/06/21 1800  ?TempSrc: Oral  ?PainSc:   ? ? ?  ?  ?  ?  ?  ?  ? ?Burk Hoctor ? ? ? ? ?

## 2021-10-06 NOTE — Op Note (Signed)
Preop diagnosis: Left displaced femoral neck fracture, closed. ? ?Postop diagnosis: Same ? ?Procedure: Left total hip arthroplasty, direct anterior approach ? ?Surgeon: Rodell Perna MD ? ?Assistant: Benjiman Core, PA-C medically necessary and present for the entire procedure ? ?Anesthesia General orotracheal plus Exparel and Marcaine 10+10 ? ?EBL: 200 cc ? ?Implants:Implants ? ?PINN ALTRX NEUT ID X OD [id number] - [id number] ? ?Inventory Item: PINN ALTRX NEUT ID X OD [id number] Serial no.:  Model/Cat no.: [id number]  ?Implant nameJennings Books NEUT ID X OD [id number] - [id number] Laterality: Left Area: Hip  ?Manufacturer: Dewitt Hoes Date of Manufacture:    ?Action: Implanted Number Used: 1   ?Device Identifier:  Device Identifier Type:    ? ?SCREW PINN CAN 6.5X20 - [id number] ? ?Inventory Item: SCREW PINN CAN 6.5X20 Serial no.:  Model/Cat no.: [id number]  ?Implant nameMalissa Hippo CAN 6.5X20 - [id number] Laterality: Left Area: Hip  ?Manufacturer: Dewitt Hoes Date of Manufacture:    ?Action: Implanted Number Used: 1   ?Device Identifier:  Device Identifier Type:    ? ?ELIMINATOR HOLE APEX DEPUY - [id number] ? ?Inventory Item: ELIMINATOR HOLE APEX DEPUY Serial no.:  Model/Cat no.: [id number]  ?Implant nameBrock Bad APEX DEPUY - [id number] Laterality: Left Area: Hip  ?Manufacturer: Dewitt Hoes Date of Manufacture:    ?Action: Implanted Number Used: 1   ?Device Identifier:  Device Identifier Type:    ? ?PINNSECTOR W/GRIP ACE CUP 48MM - [id number] ? ?Inventory Item: Erven Colla W/GRIP ACE CUP 48MM Serial no.:  Model/Cat no.: [id number]  ?Implant name: Ubaldo Glassing ACE CUP 48MM - [id number] Laterality: Left Area: Hip  ?Manufacturer: Dewitt Hoes Date of Manufacture:    ?Action: Implanted Number Used: 1   ?Device Identifier:  Device Identifier Type:    ? ?STEM FEMORAL SZ 6MM STD ACTIS - [id number] ? ?Inventory Item: STEM FEMORAL SZ 6MM STD ACTIS Serial no.:  Model/Cat no.: [id number]  ?Implant name: STEM FEMORAL SZ  6MM STD ACTIS - [id number] Laterality: Left Area: Hip  ?Manufacturer: Dewitt Hoes Date of Manufacture:    ?Action: Implanted Number Used: 1   ?Device Identifier:  Device Identifier Type:    ? ?HEAD FEM STD 32X+1 STRL - [id number] ? ?Inventory Item: HEAD FEM STD 32X+1 STRL Serial no.:  Model/Cat no.: [id number]  ?Implant name: HEAD FEM STD 32X+1 STRL - [id number] Laterality: Left Area: Hip  ?Manufacturer: Dewitt Hoes Date of Manufacture:    ?Action: Implanted Number Used: 1   ?Device Identifier:  Device Identifier Type:    ?Procedure: After induction general anesthesia and Foley catheter placement Hana boots placement on the Hana table careful positioning sticky drapes were placed after the abdomen was taped across.  Directing anterior approach was planned and ChloraPrep was used.  Sticky U drapes were then applied sterile skin marker a large shower curtain Steri-Drape.  Hydraulic arm was attached and sealed.  Stick sheet across opposite side and also over the top.  IV TXA was given Ancef 2 g prophylactically timeout procedure completed. ? ?Incision was made 1 finger lateral 1 inferior to the ASIS obliquely to the trochanter fascia was nicked extended and dull cobra is placed over the top of the capsule capsule was opened and fracture was visualized with the fracture hematoma.  Dull cobra placed over the femoral neck neck was cut under C-arm visualization appropriate length.  Head was removed with a corkscrew with slight difficulty.  90 degree retractor was placed anteriorly across the stem we sequentially reamed up to a 47 reamer for  48.  +4 neutral liner was inserted after apex hole eliminator centrally was placed and then 1 single dome screw.  Initially Did not feel real solid and cup was removed repositioned reinserted with stable in good position on C arm with Flexion and abduction and then when the apex eliminator was placed centrally the Appears solid but we elected to proceed with single dome screw  due to the patient's bone quality.  Screws in good position completely and bone 20 mm screw was used.  Liner was dialed and impacted checked hydraulic arm was applied leg was externally rotated 120 taken down and under posterior capsule was divided and preparation the canal followed sequentially with the cookie-cutter and then starter broach and then progressing up. ? ?Step 2 #6 size.  5 little bit small on AP.  +1 mm neck length gave identical leg length measurements with a line across the trochanters and the ischial tuberosity.  Good stability external rotation at 90 leg taken directly halfway down the floor with good stability.  #6 stem was inserted.  AP lateral pictures were taken once the metal ball was popped inserted after trunnion was cleaned tested and the hip was reduced.  Identical findings of stability.  Leg lengths are equal.  Final spot fluoroscopic pictures taken copious irrigation.  Transverse artery and vein were coagulated 1 final time and then standard V-Loc closure in the fascia to the subcutaneous tissue skin staple closure postop dressing and transferred to cover room. ?

## 2021-10-06 NOTE — H&P (View-Only) (Signed)
? ?Reason for Consult:lfall for left femoral neck fracture, closed.  ?Referring Physician: Flora Lipps MD  ? ?Marissa Hall is an 81 y.o. female.  ?HPI: 81 year old female with type 2 diabetes hypertension history of alcohol consumption with occasional falls asleep in a restaurant and fell in the parking lot does not think she tripped over anything.  No loss of consciousness no head injury.  She is unable to ambulate and x-rays CT scan demonstrated displaced femoral neck fracture of her left hip.  She had history of DVT and was on Xarelto for 6 months now off Xarelto.  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  She has dogs and has a son who lives in [place].  Cardiologist is Dr. Bettina Gavia.  Past history of rare PACs and occasional PVCs. ? ?Past Medical History:  ?Diagnosis Date  ? Acute DVT (deep venous thrombosis) ([place]) 04/23/2019  ? Right popliteal  ? Alcoholism ([place])   ? Anxiety and depression 1964  ? oncologist started duloxetine 12/2016  ? Breast cancer ([place]) 03/14/2016  ? Clinical stage 2A: (triple neg): Right breast, upper inner quadrant, 03/2016.  Neoadjuvant chemo x 5 cycles,lumpectomy 4 mo later, then RT started 10/2016.  Adjuvant Xeloda [id number].  SWOG research trial pt 04/2017--pt randomized to pembrolizumab immunotherapy.  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  ? Cataracts, bilateral 07/2017  ? 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 feet; responding well to cymbalta  ? COVID-19 virus infection 05/27/2020  ? Depression 1964  ? Patient states since age 89  ? Diabetes mellitus with complication [facility]) 9518  ? managed by endocrinology.  A1c Mar 12, 2018 was 7.0% at Dr. Shirlyn Goltz.   ? Epidermoid cyst of vulva   ? Chronic epidermoid cyst of the vulva.  Excision done 02/2019  ? GERD (gastroesophageal reflux disease) 2013  ? Hyperlipidemia 1986  ? Hypertension 2008  ? 2022 ->addition of  hctz to lisinopril led to 66m drop in GFR. HCTZ d/cd.  ? Hypothyroidism 1988  ? Diagnosed in her 457s  Managed by Endocrinologist  ? Lumbar radiculopathy 04/2019  ? Dr. STamala Julianto get plain films of LB and hip (considering MRI due to her hx of cancer)  ? Nonischemic cardiomyopathy ([place]   ? Hx of takotsubo CM  ? Osteoporosis 2015  ? pt states "osteopenia", but then says that she refused to take the rx med for this condition, so I suspect she had osteoporosis.  ? Peripheral neuropathy 2017  ? Patient states diabetic neuropathy in feet prior to starting chemotherapy and then worsened by chemo.   ? Syncope and collapse   ? [place]  No prodrome.  Monitor ordered and cardiology referral ordered 05/14/21  ? TIA (transient ischemic attack) 03/26/2011  ? 2012: question of (HA + R eye "floaters"). CT in ED neg acute. Not admitted, no f/u testing done.  ?ocular migraine?  ? ? ?Past Surgical History:  ?Procedure Laterality Date  ? ABDOMINAL HYSTERECTOMY  1972  ? APPENDECTOMY  1972  ? [place] ? BREAST IMPLANT REMOVAL Right 09/13/2016  ? Procedure: REMOVAL RIGHT BREAST IMPLANT;  Surgeon: BIrene Limbo MD;  [place] [facility]  Service: Plastics;  Laterality: Right;  ? BREAST LUMPECTOMY WITH RADIOACTIVE SEED AND SENTINEL LYMPH NODE BIOPSY Right 09/13/2016  ? Procedure: RIGHT BREAST LUMPECTOMY WITH RADIOACTIVE SEED X 2 AND SENTINEL LYMPH  NODE BIOPSY;  Surgeon: Alphonsa Overall, MD;  Location: [place];  Service: General;  Laterality: Right;  ? BREAST SURGERY Right 03/14/2016  ? Biopsy  ? CAPSULECTOMY Right 09/13/2016  ? Procedure: RIGHT CAPSULECTOMY;  Surgeon: Irene Limbo, MD;  Location: [place];  Service: Plastics;  Laterality: Right;  ? CATARACT EXTRACTION, BILATERAL Bilateral 08/10/17 right eye, 08/31/17 left eye  ? MASS EXCISION Left 02/28/2018  ? Path: benign.  Procedure: EXCISIONLEFT MEDIAL THIGH MASS ERAS PATHWAY;  Surgeon:  Erroll Luna, MD;  Location: [facility];  Service: General;  Laterality: Left;  ? PORTACATH PLACEMENT N/A 03/15/2016  ? Procedure: INSERTION PORT-A-CATH WITH [place];  Surgeon: Alphonsa Overall, MD;  Location: [facility];  Service: General;  Laterality: N/A;  ? PORTACATH REMOVAL  07/2017  ? SLEEP STUDY  09/2021  ? NO SLEEP APNEA  ? surgical repair left ankle Left 2009  ? s/p Fall   ? [place]  ? Age 52  ? US CAROTID DOPPLER BILATERAL ([place] HX)  01/2021  ? <50% bilat int carotid sten, otherwise normal.  ? ? ?Family History  ?Problem Relation Age of Onset  ? Stroke Mother   ? Suicidality Father   ? Stroke Brother   ? Stroke Son   ? Sleep apnea Son   ? ? ?Social History:  reports that she quit smoking about 22 years ago. Her smoking use included cigarettes. She smoked an average of 1 pack per day. She has never used smokeless tobacco. She reports current alcohol use of about 7.0 standard drinks per week. She reports that she does not use drugs. ? ?Allergies:  ?Allergies  ?Allergen Reactions  ? Other Other (See Comments)  ?  STEROIDS- emotional  ? Prednisone Other (See Comments)  ?  Other reaction(s): Mental Status Changes (intolerance)  ? Amlodipine Other (See Comments)  ?  Elevated creatinine  ? Hydrochlorothiazide Other (See Comments)  ?  Elevated serum creatinine  ? Penicillins Other (See Comments)  ?  Unsure of reaction, was 81 years old  ? ? ?Medications: I have reviewed the patient's current medications. ? ?Results for orders placed or performed during the hospital encounter of 10/05/21 (from the past 48 hour(s))  ?CBC     Status: None  ? Collection Time: 10/06/21 12:46 AM  ?Result Value Ref Range  ? WBC 8.8 4.0 - 10.5 K/uL  ? RBC 3.99 3.87 - 5.11 MIL/uL  ? Hemoglobin 13.1 12.0 - 15.0 g/dL  ? HCT 39.0 36.0 - 46.0 %  ? MCV 97.7 80.0 - 100.0 fL  ? MCH 32.8 26.0 - 34.0 pg  ? MCHC 33.6 30.0 - 36.0 g/dL  ? RDW 12.9 11.5 - 15.5 %  ? Platelets 165 150 - 400 K/uL  ? nRBC 0.0 0.0 - 0.2 %  ?   Comment: Performed at [facility], [place] [address] [place], [place] [address]  ?Basic metabolic panel     Status: Abnormal  ? Collection Time: 10/06/21 12:46 AM  ?Result Value Ref Range  ? Sodium 133 (L) 135 - 145 mmol/L  ? Potassium 4.2 3.5 - 5.1 mmol/L  ? Chloride 100 98 - 111 mmol/L  ? CO2 21 (L) 22 - 32 mmol/L  ? Glucose, Bld 228 (H) 70 - 99 mg/dL  ?  Comment: Glucose reference range applies only to samples taken after fasting for at least 8 hours.  ? BUN 19 8 - 23 mg/dL  ? Creatinine, Ser 0.65  0.44 - 1.00 mg/dL  ? Calcium 9.1 8.9 - 10.3 mg/dL  ? GFR, Estimated >60 >60 mL/min  ?  Comment: (NOTE) ?Calculated using the CKD-EPI Creatinine Equation (2021) ?  ? Anion gap 12 5 - 15  ?  Comment: Performed at [facility], [place] [address] [place], [place] [address]  ?ABO/Rh     Status: None  ? Collection Time: 10/06/21 12:46 AM  ?Result Value Ref Range  ? ABO/RH(D)    ?  A POS ?Performed at [facility], [place] [address], [place], [place] [address] ?  ?Ethanol     Status: Abnormal  ? Collection Time: 10/06/21  1:10 AM  ?Result Value Ref Range  ? Alcohol, Ethyl (B) 160 (H) <10 mg/dL  ?  Comment: (NOTE) ?Lowest detectable limit for serum alcohol is 10 mg/dL. ? ?For medical purposes only. ?Performed at [facility], [place] [address] ?[place], [place] [address] ?  ?Glucose, capillary     Status: Abnormal  ? Collection Time: 10/06/21  2:15 AM  ?Result Value Ref Range  ? Glucose-Capillary 228 (H) 70 - 99 mg/dL  ?  Comment: Glucose reference range applies only to samples taken after fasting for at least 8 hours.  ?Glucose, capillary     Status: Abnormal  ? Collection Time: 10/06/21  4:47 AM  ?Result Value Ref Range  ? Glucose-Capillary 137 (H) 70 - 99 mg/dL  ?  Comment: Glucose reference range applies only to samples taken after fasting for at least 8 hours.  ?Type and screen [place]     Status: None  ? Collection Time: 10/06/21   4:48 AM  ?Result Value Ref Range  ? ABO/RH(D) A POS   ? Antibody Screen NEG   ? Sample Expiration    ?  10/09/2021,2359 ?Performed at [facility], [place] [address] [place], [place] [address]

## 2021-10-06 NOTE — Discharge Instructions (Addendum)
INSTRUCTIONS AFTER JOINT REPLACEMENT  ? ?Remove items at home which could result in a fall. This includes throw rugs or furniture in walking pathways ?ICE to the affected joint every three hours while awake for 30 minutes at a time, for at least the first 3-5 days, and then as needed for pain and swelling.  Continue to use ice for pain and swelling. You may notice swelling that will progress down to the foot and ankle.  This is normal after surgery.  Elevate your leg when you are not up walking on it.   ?Continue to use the breathing machine you got in the hospital (incentive spirometer) which will help keep your temperature down.  It is common for your temperature to cycle up and down following surgery, especially at night when you are not up moving around and exerting yourself.  The breathing machine keeps your lungs expanded and your temperature down. ? ? ?DIET:  As you were doing prior to hospitalization, we recommend a well-balanced diet. ? ?DRESSING / WOUND CARE / SHOWERING ? ?You may change your dressing 3-5 days after surgery.  Then change the dressing every day with sterile gauze.  Please use good hand washing techniques before changing the dressing.  Do not use any lotions or creams on the incision until instructed by your surgeon. ? ?ACTIVITY ? ?Increase activity slowly as tolerated, but follow the weight bearing instructions below.   ?No driving for 6 weeks or until further direction given by your physician.  You cannot drive while taking narcotics.  ?No lifting or carrying greater than 10 lbs. until further directed by your surgeon. ?Avoid periods of inactivity such as sitting longer than an hour when not asleep. This helps prevent blood clots.  ?You may return to work once you are authorized by your doctor.  ? ? ? ?WEIGHT BEARING  ? ?Weight bearing as tolerated with assist device (walker, cane, etc) as directed, use it as long as suggested by your surgeon or therapist, typically at least 4-6  weeks. ? ? ?EXERCISES ? ?Results after joint replacement surgery are often greatly improved when you follow the exercise, range of motion and muscle strengthening exercises prescribed by your doctor. Safety measures are also important to protect the joint from further injury. Any time any of these exercises cause you to have increased pain or swelling, decrease what you are doing until you are comfortable again and then slowly increase them. If you have problems or questions, call your caregiver or physical therapist for advice.  ? ?Rehabilitation is important following a joint replacement. After just a few days of immobilization, the muscles of the leg can become weakened and shrink (atrophy).  These exercises are designed to build up the tone and strength of the thigh and leg muscles and to improve motion. Often times heat used for twenty to thirty minutes before working out will loosen up your tissues and help with improving the range of motion but do not use heat for the first two weeks following surgery (sometimes heat can increase post-operative swelling).  ? ? ?A rehabilitation program following joint replacement surgery can speed recovery and prevent re-injury in the future due to weakened muscles. Contact your doctor or a physical therapist for more information on knee rehabilitation.  ? ? ?CONSTIPATION ? ?Constipation is defined medically as fewer than three stools per week and severe constipation as less than one stool per week.  Even if you have a regular bowel pattern at home, your normal regimen  is likely to be disrupted due to multiple reasons following surgery.  Combination of anesthesia, postoperative narcotics, change in appetite and fluid intake all can affect your bowels.  ? ?YOU MUST use at least one of the following options; they are listed in order of increasing strength to get the job done.  They are all available over the counter, and you may need to use some, POSSIBLY even all of these  options:   ? ?Drink plenty of fluids (prune juice may be helpful) and high fiber foods ?Colace 100 mg by mouth twice a day  ?Senokot for constipation as directed and as needed Dulcolax (bisacodyl), take with full glass of water  ?Miralax (polyethylene glycol) once or twice a day as needed. ? ?If you have tried all these things and are unable to have a bowel movement in the first 3-4 days after surgery call either your surgeon or your primary doctor.   ? ?If you experience loose stools or diarrhea, hold the medications until you stool forms back up.  If your symptoms do not get better within 1 week or if they get worse, check with your doctor.  If you experience "the worst abdominal pain ever" or develop nausea or vomiting, please contact the office immediately for further recommendations for treatment. ? ? ?ITCHING:  If you experience itching with your medications, try taking only a single pain pill, or even half a pain pill at a time.  You can also use Benadryl over the counter for itching or also to help with sleep.  ? ?TED HOSE STOCKINGS:  Use stockings on both legs until for at least 2 weeks or as directed by physician office. They may be removed at night for sleeping. ? ?MEDICATIONS:  See your medication summary on the ?After Visit Summary? that nursing will review with you.  You may have some home medications which will be placed on hold until you complete the course of blood thinner medication.  It is important for you to complete the blood thinner medication as prescribed. ? ?Information on my medicine - XARELTO? (Rivaroxaban) ? ? ?Why was Xarelto? prescribed for you? ?Xarelto? was prescribed for you to reduce the risk of blood clots forming after orthopedic surgery. The medical term for these abnormal blood clots is venous thromboembolism (VTE). ? ?What do you need to know about xarelto? ? ?Take your Xarelto? ONCE DAILY at the same time every day. ?You may take it either with or without food. ? ?If you have  difficulty swallowing the tablet whole, you may crush it and mix in applesauce just prior to taking your dose. ? ?Take Xarelto? exactly as prescribed by your doctor and DO NOT stop taking Xarelto? without talking to the doctor who prescribed the medication.  Stopping without other VTE prevention medication to take the place of Xarelto? may increase your risk of developing a clot. ? ?After discharge, you should have regular check-up appointments with your healthcare provider that is prescribing your Xarelto?.   ? ?What do you do if you miss a dose? ?If you miss a dose, take it as soon as you remember on the same day then continue your regularly scheduled once daily regimen the next day. Do not take two doses of Xarelto? on the same day.  ? ?Important Safety Information ?A possible side effect of Xarelto? is bleeding. You should call your healthcare provider right away if you experience any of the following: ?Bleeding from an injury or your nose that does not stop. ?  Unusual colored urine (red or dark brown) or unusual colored stools (red or black). ?Unusual bruising for unknown reasons. ?A serious fall or if you hit your head (even if there is no bleeding). ? ?Some medicines may interact with Xarelto? and might increase your risk of bleeding while on Xarelto?[name] Kitchen To help avoid this, consult your healthcare provider or pharmacist prior to using any new prescription or non-prescription medications, including herbals, vitamins, non-steroidal anti-inflammatory drugs (NSAIDs) and supplements. ? ?This website has more information on Xarelto?: [email or web address]-benson.com/. ? ? ? ?PRECAUTIONS:  If you experience chest pain or shortness of breath - call 911 immediately for transfer to the hospital emergency department.  ? ?If you develop a fever greater that 101 F, purulent drainage from wound, increased redness or drainage from wound, foul odor from the wound/dressing, or calf pain - CONTACT YOUR SURGEON.   ?                                                 ?FOLLOW-UP APPOINTMENTS:  If you do not already have a post-op appointment, please call the office for an appointment to be seen by your surgeon.  Guidelines for how soon to be seen are listed in yo

## 2021-10-06 NOTE — Assessment & Plan Note (Signed)
Holding antihypertensives for now. ?

## 2021-10-06 NOTE — Assessment & Plan Note (Signed)
Continue Synthroid °

## 2021-10-06 NOTE — H&P (Signed)
?History and Physical  ? ? ?Marissa Hall GBT:[id number] DOB: Oct 14, 1940 DOA: 10/05/2021 ? ?PCP: Tammi Sou, MD  ?Patient coming from: Home via EMS ? ?I have personally briefly reviewed patient's old medical records in [place] ? ?Chief Complaint: Left hip pain after fall ? ?HPI: ?Declyn Offield is a 81 y.o. female with medical history significant for T2DM, HTN, HLD, hypothyroidism, history of DVT (s/p 6 months Xarelto), recurrent hyperkalemia, recurrent syncope, history of CVA, depression/anxiety, and alcohol use who presented to the ED for evaluation of left hip pain after a fall. ? ?Patient states that she was leaving a restaurant in [place].  She had several glasses of wine with dinner.  She was walking in the parking lot when she suddenly fell to the ground.  She says that she thinks that she just fell without any provoking factor.  She thinks she did lose consciousness.  She says she did hit the back of her head.  She was having significant left hip pain and unable to stand on her own.  EMS were called and she was brought to the ED for further evaluation. ? ?She denied any associated chest pain, palpitations, dyspnea, abdominal pain, nausea, vomiting, diarrhea, dysuria. ? ?She reports a history of recurrent syncopal type episodes, usually occurring when she is drinking alcohol.  She had prior work-up with cardiac event monitor which she wore for a month from November to December 2022.  This showed rare PACs and PVCs.  Follows with cardiology, Dr. Bettina Gavia. ? ?Patient states she drinks "too much" alcohol.  At least a bottle of wine and few beers daily. ? ?ED Course  Labs/Imaging on admission: I have personally reviewed following labs and imaging studies. ? ?Initial vitals showed BP 121/75, pulse 71, RR 16, temp 98.2 ?F, SPO2 95% on room air. ? ?Labs show WBC 8.8, hemoglobin 13.1, platelets 165,000, sodium 133, potassium 4.2, bicarb 21, BUN 19, creatinine 0.65, serum glucose 228.  Serum ethanol  in process.  Type and screen ordered. ? ?CT pelvis without contrast shows an acute fracture of the proximal left femur. ? ?CT head without contrast negative for acute or cranial abnormality.  Mild atrophy noted. ? ?CT cervical spine without contrast shows multilevel degenerative changes without acute osseous abnormality. ? ?Patient was given Tylenol and IV fentanyl.  EDP consulted on-call orthopedics, Dr. Lorin Mercy, who recommended medical admission and will consult in the morning.  The hospitalist service was consulted to admit for further evaluation and management. ? ?Review of Systems: All systems reviewed and are negative except as documented in history of present illness above. ? ? ?Past Medical History:  ?Diagnosis Date  ? Acute DVT (deep venous thrombosis) ([place]) 04/23/2019  ? Right popliteal  ? Alcoholism ([place])   ? Anxiety and depression 1964  ? oncologist started duloxetine 12/2016  ? Breast cancer ([place]) 03/14/2016  ? Clinical stage 2A: (triple neg): Right breast, upper inner quadrant, 03/2016.  Neoadjuvant chemo x 5 cycles,lumpectomy 4 mo later, then RT started 10/2016.  Adjuvant Xeloda [id number].  SWOG research trial pt 04/2017--pt randomized to pembrolizumab immunotherapy.  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  ? Cataracts, bilateral 07/2017  ? 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 feet; responding well to cymbalta  ? COVID-19 virus infection 05/27/2020  ? Depression 1964  ? Patient states since age 23  ? Diabetes mellitus with complication [facility]) 7106  ? managed by  endocrinology.  A1c Mar 12, 2018 was 7.0% at Dr. Shirlyn Goltz.   ? Epidermoid cyst of vulva   ? Chronic epidermoid cyst of the vulva.  Excision done 02/2019  ? GERD (gastroesophageal reflux disease) 2013  ? Hyperlipidemia 1986  ? Hypertension 2008  ? 2022 ->addition of hctz to lisinopril led to 58m drop in GFR. HCTZ d/cd.  ? Hypothyroidism 1988  ?  Diagnosed in her 432s  Managed by Endocrinologist  ? Lumbar radiculopathy 04/2019  ? Dr. STamala Julianto get plain films of LB and hip (considering MRI due to her hx of cancer)  ? Nonischemic cardiomyopathy ([place]   ? Hx of takotsubo CM  ? Osteoporosis 2015  ? pt states "osteopenia", but then says that she refused to take the rx med for this condition, so I suspect she had osteoporosis.  ? Peripheral neuropathy 2017  ? Patient states diabetic neuropathy in feet prior to starting chemotherapy and then worsened by chemo.   ? Syncope and collapse   ? [place]  No prodrome.  Monitor ordered and cardiology referral ordered 05/14/21  ? TIA (transient ischemic attack) 03/26/2011  ? 2012: question of (HA + R eye "floaters"). CT in ED neg acute. Not admitted, no f/u testing done.  ?ocular migraine?  ? ? ?Past Surgical History:  ?Procedure Laterality Date  ? ABDOMINAL HYSTERECTOMY  1972  ? APPENDECTOMY  1972  ? [place] ? BREAST IMPLANT REMOVAL Right 09/13/2016  ? Procedure: REMOVAL RIGHT BREAST IMPLANT;  Surgeon: BIrene Limbo MD;  [facility]  Service: Plastics;  Laterality: Right;  ? BREAST LUMPECTOMY WITH RADIOACTIVE SEED AND SENTINEL LYMPH NODE BIOPSY Right 09/13/2016  ? Procedure: RIGHT BREAST LUMPECTOMY WITH RADIOACTIVE SEED X 2 AND SENTINEL LYMPH NODE BIOPSY;  Surgeon: DAlphonsa Overall MD;  [facility]  Service: General;  Laterality: Right;  ? BREAST SURGERY Right 03/14/2016  ? Biopsy  ? CAPSULECTOMY Right 09/13/2016  ? Procedure: RIGHT CAPSULECTOMY;  Surgeon: BIrene Limbo MD;  Location[facility]  Service: Plastics;  Laterality: Right;  ? CATARACT EXTRACTION, BILATERAL Bilateral 08/10/17 right eye, 08/31/17 left eye  ? MASS EXCISION Left 02/28/2018  ? Path: benign.  Procedure: EXCISIONLEFT MEDIAL THIGH MASS ERAS PATHWAY;  Surgeon: CErroll Luna MD;  [facility]  Service: General;   Laterality: Left;  ? PORTACATH PLACEMENT N/A 03/15/2016  ? Procedure: INSERTION PORT-A-CATH WITH [place]  Surgeon: DAlphonsa Overall MD;  Location[facility];  Service: General;  Laterality: N/A;  ? PORTACATH REMOVAL  07/2017  ? SLEEP STUDY  09/2021  ? NO SLEEP APNEA  ? surgical repair left ankle Left 2009  ? s/p Fall   ? T[place] ? Age 54  ? [place]OTID DOPPLER BILATERAL ([place])  01/2021  ? <50% bilat int carotid sten, otherwise normal.  ? ? ?Social History: ? reports that she quit smoking about 22 years ago. Her smoking use included cigarettes. She smoked an average of 1 pack per day. She has never used smokeless tobacco. She reports current alcohol use of about 7.0 standard drinks per week. She reports that she does not use drugs. ? ?Allergies  ?Allergen Reactions  ? Other Other (See Comments)  ?  STEROIDS- emotional  ? Prednisone Other (See Comments)  ?  Other reaction(s): Mental Status Changes (intolerance)  ? Amlodipine   ?  "Side effect"  ? Hydrochlorothiazide Other (See Comments)  ?  Elevated creatinine   ? Penicillins Other (See Comments)  ?  Unsure of reaction, was 81 years old  ? ? ?Family History  ?Problem Relation Age of Onset  ? Stroke Mother   ? Suicidality Father   ? Stroke Brother   ? Stroke Son   ? Sleep apnea Son   ? ? ? ?Prior to Admission medications   ?Medication Sig Start Date End Date Taking? Authorizing Provider  ?atorvastatin (LIPITOR) 80 MG tablet Take 1 tablet (80 mg total) by mouth every evening. 02/25/21   Shamleffer, Melanie Crazier, MD  ?carvedilol (COREG) 6.25 MG tablet Take 1 tablet (6.25 mg total) by mouth 2 (two) times daily with a meal. 10/05/21   McGowen, Adrian Blackwater, MD  ?cholecalciferol (VITAMIN D3) 25 MCG (1000 UNIT) tablet Take 1,000 Units by mouth daily.    [provider]  ?diclofenac Sodium (VOLTAREN) 1 % GEL Apply 2 g topically 4 (four) times daily. ?Patient taking differently: Apply 2 g topically as needed. 01/05/21   Wieters, Hallie C, PA-C   ?DULoxetine (CYMBALTA) 60 MG capsule Take 1 capsule (60 mg total) by mouth daily. 10/05/21   McGowen, Adrian Blackwater, MD  ?hydrALAZINE (APRESOLINE) 25 MG tablet Take 1 tablet (25 mg total) by mouth 3 (three) times daily. 3/16/

## 2021-10-06 NOTE — Progress Notes (Signed)
Same day note ? ?Patient seen and examined at bedside. ? ?Patient was admitted to the hospital for hip pain after fall. ? ?At the time of my evaluation, patient complains of pain over the left hip on movement ? ?Physical examination reveals average built female, alert awake and communicative not in obvious distress.  Hip tenderness on palpation. ? ?Laboratory data and imaging was reviewed ? ?Assessment and Plan: ?* Closed fracture of proximal end of left femur, initial encounter ([place]) ?Patient is currently NPO.  Orthopedic has seen the patient this morning.  Surgical intervention today. ?  ?Syncope and collapse ?History of previous syncopes and cardiac event monitor in the past showing rare PACs and PVCs.  Follows up with Dr. Bettina Gavia as outpatient.  Discouraged use of alcohol.  Unable to check orthostatic vitals at this time.  Continue IV hydration.  Will need to follow-up with cardiology as outpatient. ? ?Alcohol use ?Reports drinking at least a bottle of wine and several beers daily.  Last drink night of admission prior to fall.  Continue on CIWA protocol and Ativan.  No current withdrawal symptoms. ?  ?Diabetes mellitus type 2 without retinopathy ([place]) ?Sliding scale insulin at this time.  Patient takes metformin at home.  We will continue to monitor while in the hospital. ?  ?Hypertension associated with diabetes ([place]) ?Currently n.p.o. for surgery.  Takes Coreg, valsartan and hydralazine at home.  We will add as needed hydralazine. ?  ?Hyperlipidemia associated with type 2 diabetes mellitus ([place]) ?Continue Lipitor. ?  ?Hypothyroidism ?Continue Synthroid. ?  ?Anxiety and depression ?Continue Cymbalta and trazodone. ? ? ?No Charge ? ?Signed, ? ?Delila Pereyra, MD ?[facility] ? ?

## 2021-10-06 NOTE — Telephone Encounter (Signed)
FMLA forms received for family member. To [employer] ?

## 2021-10-06 NOTE — Assessment & Plan Note (Addendum)
Continue Cymbalta and trazodone. ?

## 2021-10-06 NOTE — Assessment & Plan Note (Signed)
-  Orthopedics to see in a.m. ?-Keep n.p.o. ?-Continue analgesics as needed ?

## 2021-10-06 NOTE — Plan of Care (Signed)
  Problem: Pain Managment: Goal: General experience of comfort will improve Outcome: Progressing   Problem: Safety: Goal: Ability to remain free from injury will improve Outcome: Progressing   Problem: Nutrition: Goal: Adequate nutrition will be maintained Outcome: Progressing   

## 2021-10-06 NOTE — Assessment & Plan Note (Signed)
Hold home meds and place on SSI. 

## 2021-10-06 NOTE — Assessment & Plan Note (Signed)
History of prior syncope in the setting of alcohol use with similar episode prior to this admission resulting in left hip fracture.  Prior cardiac event monitor showed rare PACs and PVCs.  Suspect the syncopal event again was alcohol related with possible orthostasis/vasovagal episode. ?-Keep on telemetry ?-Unable to obtain orthostatic vitals due to hip fracture ?-Start gentle IV fluid hydration overnight ?-Hold antihypertensives for now ?

## 2021-10-07 ENCOUNTER — Encounter (HOSPITAL_COMMUNITY): Payer: Self-pay | Admitting: Orthopaedic Surgery

## 2021-10-07 DIAGNOSIS — F419 Anxiety disorder, unspecified: Secondary | ICD-10-CM | POA: Diagnosis not present

## 2021-10-07 DIAGNOSIS — S72002A Fracture of unspecified part of neck of left femur, initial encounter for closed fracture: Secondary | ICD-10-CM | POA: Diagnosis not present

## 2021-10-07 DIAGNOSIS — E875 Hyperkalemia: Secondary | ICD-10-CM

## 2021-10-07 DIAGNOSIS — E440 Moderate protein-calorie malnutrition: Secondary | ICD-10-CM

## 2021-10-07 DIAGNOSIS — E119 Type 2 diabetes mellitus without complications: Secondary | ICD-10-CM | POA: Diagnosis not present

## 2021-10-07 DIAGNOSIS — Z789 Other specified health status: Secondary | ICD-10-CM | POA: Diagnosis not present

## 2021-10-07 LAB — BASIC METABOLIC PANEL
Anion gap: 7 (ref 5–15)
BUN: 14 mg/dL (ref 8–23)
CO2: 25 mmol/L (ref 22–32)
Calcium: 8.6 mg/dL — ABNORMAL LOW (ref 8.9–10.3)
Chloride: 104 mmol/L (ref 98–111)
Creatinine, Ser: 0.92 mg/dL (ref 0.44–1.00)
GFR, Estimated: >60 mL/min (ref >60)
Glucose, Bld: 163 mg/dL — ABNORMAL HIGH (ref 70–99)
Potassium: 4.4 mmol/L (ref 3.5–5.1)
Sodium: 136 mmol/L (ref 135–145)

## 2021-10-07 LAB — CBC
HCT: 31.8 % — ABNORMAL LOW (ref 36.0–46.0)
Hemoglobin: 10.6 g/dL — ABNORMAL LOW (ref 12.0–15.0)
MCH: 32.9 pg (ref 26.0–34.0)
MCHC: 33.3 g/dL (ref 30.0–36.0)
MCV: 98.8 fL (ref 80.0–100.0)
Platelets: 122 10*3/uL — ABNORMAL LOW (ref 150–400)
RBC: 3.22 MIL/uL — ABNORMAL LOW (ref 3.87–5.11)
RDW: 12.9 % (ref 11.5–15.5)
WBC: 4.9 10*3/uL (ref 4.0–10.5)
nRBC: 0 % (ref 0.0–0.2)

## 2021-10-07 LAB — GLUCOSE, CAPILLARY
Glucose-Capillary: 138 mg/dL — ABNORMAL HIGH (ref 70–99)
Glucose-Capillary: 182 mg/dL — ABNORMAL HIGH (ref 70–99)
Glucose-Capillary: 211 mg/dL — ABNORMAL HIGH (ref 70–99)
Glucose-Capillary: 178 mg/dL — ABNORMAL HIGH (ref 70–99)
Glucose-Capillary: 225 mg/dL — ABNORMAL HIGH (ref 70–99)

## 2021-10-07 MED ORDER — METOCLOPRAMIDE HCL 5 MG PO TABS: 5.0000 mg | ORAL_TABLET | Freq: Three times a day (TID) | ORAL | Status: DC | PRN

## 2021-10-07 MED ORDER — CARVEDILOL 6.25 MG PO TABS
6.2500 mg | ORAL_TABLET | Freq: Two times a day (BID) | ORAL | Status: DC
Start: 2021-10-07 — End: 2021-10-08
  Administered 2021-10-07 – 2021-10-08 (×2): 6.25 mg via ORAL
  Filled 2021-10-07 (×2): qty 1

## 2021-10-07 MED ORDER — IRBESARTAN 150 MG PO TABS
300.0000 mg | ORAL_TABLET | Freq: Every day | ORAL | Status: DC
  Filled 2021-10-07: qty 2

## 2021-10-07 MED ORDER — ONDANSETRON HCL 4 MG/2ML IJ SOLN: 4.0000 mg | Freq: Four times a day (QID) | INTRAMUSCULAR | Status: DC | PRN

## 2021-10-07 MED ORDER — HYDRALAZINE HCL 25 MG PO TABS
25.0000 mg | ORAL_TABLET | Freq: Three times a day (TID) | ORAL | Status: DC
  Administered 2021-10-07 – 2021-10-08 (×4): 25 mg via ORAL
  Filled 2021-10-07 (×4): qty 1

## 2021-10-07 MED ORDER — HYDROMORPHONE HCL 1 MG/ML IJ SOLN
0.5000 mg | INTRAMUSCULAR | Status: DC | PRN
  Administered 2021-10-07 (×3): 0.5 mg via INTRAVENOUS
  Filled 2021-10-07 (×3): qty 0.5

## 2021-10-07 MED ORDER — ONDANSETRON HCL 4 MG PO TABS
4.0000 mg | ORAL_TABLET | Freq: Four times a day (QID) | ORAL | Status: DC | PRN
Start: 2021-10-07 — End: 2021-10-13

## 2021-10-07 MED ORDER — OXYCODONE HCL 5 MG PO TABS
5.0000 mg | ORAL_TABLET | ORAL | Status: DC | PRN
  Administered 2021-10-08 – 2021-10-11 (×9): 5 mg via ORAL
  Filled 2021-10-07 (×9): qty 1

## 2021-10-07 MED ORDER — METOCLOPRAMIDE HCL 5 MG/ML IJ SOLN: 5.0000 mg | Freq: Three times a day (TID) | INTRAMUSCULAR | Status: DC | PRN

## 2021-10-07 MED ORDER — VITAMIN D 25 MCG (1000 UNIT) PO TABS
1000.0000 [IU] | ORAL_TABLET | Freq: Every day | ORAL | Status: DC
  Administered 2021-10-07 – 2021-10-13 (×7): 1000 [IU] via ORAL
  Filled 2021-10-07 (×7): qty 1

## 2021-10-07 MED ORDER — ACETAMINOPHEN 325 MG PO TABS
325.0000 mg | ORAL_TABLET | Freq: Four times a day (QID) | ORAL | Status: DC | PRN
  Administered 2021-10-11 – 2021-10-12 (×2): 650 mg via ORAL
  Filled 2021-10-07 (×2): qty 2

## 2021-10-07 NOTE — TOC Progression Note (Addendum)
Transition of Care (TOC) - Initial/Assessment Note  ? ? ?Patient Details  ?Name: Marissa Hall ?MRN: [id number] ?Date of Birth: 08/10/1940 ? ?Transition of Care (TOC) CM/SW Contact:    ?Elizabeth N Grant, RN ?Phone Number: ?10/07/2021, 3:02 PM ? ?Clinical Narrative:                 ? ?Met with patient at bedside.  Discussed physical therapy's recommendation for SNF.  Patient is agreeable, states she works in [place], so if she was in a facility close to her work, someone could bring her work to do while she was in rehab.  Patient isn't sure about choice, yet, since she's never been to rehab.  Discussed Medicare.gov website, and patient states she has a friend in the business and will ask her.  CM let patient know that the referral was sent to facilities in the [place], [place], and [place] area.   ? ?Patient's PASRR went to a level 2, CM uploaded requested documentation to  Must.  PASRR number still pending.  ? ?1630: Patient's PASRR went to [facility].  ? ? ?Expected Discharge Plan: Skilled Nursing Facility ?Barriers to Discharge: Continued Medical Work up ? ?

## 2021-10-07 NOTE — Progress Notes (Signed)
?PROGRESS NOTE ? ? ? ?Marissa Hall  WGN:[id number] DOB: May 28, 1941 DOA: 10/05/2021 ?PCP: Tammi Sou, MD  ? ? ?Brief Narrative:  ?Marissa Hall is a 81 y.o. female with past medical history of type 2 diabetes, hypertension, hyperlipidemia, hypothyroidism, history of DVT status post 6 months of Xarelto, recurrent hyperkalemia, recurrent syncope, history of CVA, depression and anxiety and alcohol use disorder presented to hospital after sustaining a syncopal event and hurting her hip.  Has history of wearing a monitor for recurrent syncope in the past and follows up with Dr. Bettina Gavia cardiology as outpatient with mention of PACs and some PVCs.  Patient  stated that she drinks too much alcohol at home. In the ED, patient was noted to have stable vitals.  WBC was 8.8 hemoglobin of 13.1.  Creatinine 0.6.  CT head was negative for fracture.  CT pelvis showed acute fracture of the proximal left femur.  CT C-spine with multilevel degenerative changes.  Orthopedics was consulted from the ED and patient was admitted hospital for further evaluation and treatment. ? ?Assessment and Plan: ?* Closed fracture of proximal end of left femur, initial encounter ([place]) ?Patient was seen by orthopedics and underwent total hip arthroplasty on 10/06/2021.  Has been started on Xarelto for DVT prophylaxis.  PT evaluation has been ordered.  Follow orthopedic recommendations. ?  ?Syncope and collapse ?History of previous syncopes and cardiac event monitor in the past showing rare PACs and PVCs.  Follows up with Dr. Bettina Gavia as outpatient.  Discouraged use of alcohol.  No chest pain during this hospitalization.   Will need to follow-up with cardiology as outpatient.  Discontinue IV fluids at this time. ? ?Alcohol use ?Reports drinking at least a bottle of wine and several beers daily.  Last drink night of admission prior to fall.  Continue on CIWA protocol and Ativan.  Denies any hallucinations tremors sweating or active signs of  withdrawal. ? ?Diabetes mellitus type 2 without retinopathy ([place]) ?Continue sliding scale insulin at this time.  Patient takes metformin at home.  We will continue to monitor while in the hospital.  POC glucose of 182. ?  ?Hypertension associated with diabetes ([place]) ? Takes Coreg, valsartan and hydralazine at home.  Will resume home medications starting today. ? ?Hyperlipidemia associated with type 2 diabetes mellitus ([place]) ?Continue Lipitor. ?  ?Hypothyroidism ?Continue Synthroid. ?  ?Anxiety and depression ?Continue Cymbalta and trazodone.  ? ?  ? DVT prophylaxis: rivaroxaban (XARELTO) tablet 10 mg Start: 10/07/21 0800 ?SCDs Start: 10/06/21 1756 ?Place TED hose Start: 10/06/21 1756 ?SCDs Start: 10/06/21 0105 ?rivaroxaban (XARELTO) tablet 10 mg  ? ?Code Status:   ?  Code Status: DNR ? ?Disposition: Orthopedics ?Status is: Inpatient ?Remains inpatient appropriate because: Status post hip surgery, ? ? Family Communication:  ?Spoke with the patient's son at bedside.  Patient's son requesting rehabilitation placement at this time since patient lives by herself and has animals to take care of at home. ? ?Consultants:  ?Orthopedics ? ?Procedures:  ?Left Total hip arthroplasty on 10/06/2021 by Dr. Lorin Mercy orthopedics. ? ?Antimicrobials:  ?None ? ?Anti-infectives (From admission, onward)  ? ? Start     Dose/Rate Route Frequency Ordered Stop  ? 10/06/21 1315  ceFAZolin (ANCEF) IVPB 2g/100 mL premix       ? 2 g ?200 mL/hr over 30 Minutes Intravenous On call to O.R. 10/06/21 1259 10/06/21 1515  ? ?  ? ?Subjective: ?Today, patient was seen and examined at bedside.  Complains of mild hip pain.  Denies  any tremors sweating diaphoresis.  Denies any nausea vomiting.  Patient's son at bedside. ? ?Objective: ?Vitals:  ? 10/06/21 1715 10/06/21 1800 10/06/21 2032 10/07/21 0342  ?BP: 134/73 135/65 (!) 155/65 (!) 148/70  ?Pulse: 80 76 81 84  ?Resp: '17 16 17 17  '$ ?Temp: 98.4 ?F (36.9 ?C) (!) 97.5 ?F (36.4 ?C) 97.9 ?F (36.6 ?C) 99.6 ?F (37.6  ?C)  ?TempSrc:  Oral    ?SpO2: 93% (!) 89% 97% 96%  ? ? ?Intake/Output Summary (Last 24 hours) at 10/07/2021 0859 ?Last data filed at 10/06/2021 1803 ?Gross per 24 hour  ?Intake 1300.31 ml  ?Output 200 ml  ?Net 1100.31 ml  ? ?There were no vitals filed for this visit. ? ?Physical Examination: ?General:  Average built, not in obvious distress ?HENT:   No scleral pallor or icterus noted. Oral mucosa is moist.  ?Chest:  Clear breath sounds.  Diminished breath sounds bilaterally. No crackles or wheezes.  ?CVS: S1 &S2 heard. No murmur.  Regular rate and rhythm. ?Abdomen: Soft, nontender, nondistended.  Bowel sounds are heard.   ?Extremities: No cyanosis, clubbing or edema.  Peripheral pulses are palpable.  Left hip surgical site with dressing. ?Psych: Alert, awake and oriented, normal mood ?CNS:  No cranial nerve deficits.  Power equal in all extremities.   ?Skin: Warm and dry.  Left hip surgical site with dressing. ? ?Data Reviewed:  ? ?CBC: ?Recent Labs  ?Lab 10/06/21 ?2778 10/07/21 ?2423  ?WBC 8.8 4.9  ?HGB 13.1 10.6*  ?HCT 39.0 31.8*  ?MCV 97.7 98.8  ?PLT 165 122*  ? ? ?Basic Metabolic Panel: ?Recent Labs  ?Lab 10/05/21 ?1148 10/06/21 ?5361 10/07/21 ?4431  ?NA 137 133* 136  ?K 5.8* 4.2 4.4  ?CL 101 100 104  ?CO2 28 21* 25  ?GLUCOSE 133* 228* 163*  ?BUN '21 19 14  '$ ?CREATININE 1.12 0.65 0.92  ?CALCIUM 9.6 9.1 8.6*  ? ? ?Liver Function Tests: ?No results for input(s): AST, ALT, ALKPHOS, BILITOT, PROT, ALBUMIN in the last 168 hours. ? ? ?Radiology Studies: ?CT Head Wo Contrast ? ?Result Date: 10/05/2021 ?CLINICAL DATA:  Head trauma could be EXAM: CT HEAD WITHOUT CONTRAST CT CERVICAL SPINE WITHOUT CONTRAST TECHNIQUE: Multidetector CT imaging of the head and cervical spine was performed following the standard protocol without intravenous contrast. Multiplanar CT image reconstructions of the cervical spine were also generated. RADIATION DOSE REDUCTION: This exam was performed according to the departmental dose-optimization program  which includes automated exposure control, adjustment of the mA and/or kV according to patient size and/or use of iterative reconstruction technique. COMPARISON:  CT brain 07/14/2021 FINDINGS: CT HEAD FINDINGS Brain: No acute territorial infarction, hemorrhage or intracranial mass. Mild atrophy. Chronic lacunar infarct in the right basal ganglia/white matter. Nonenlarged ventricles. Vascular: No hyperdense vessels. Vertebral and carotid vascular calcification Skull: Normal. Negative for fracture or focal lesion. Sinuses/Orbits: No acute finding. Other: Small left posterior parietal scalp hematoma CT CERVICAL SPINE FINDINGS Alignment: Trace anterolisthesis C3 on C4. Generalized straightening. Facet alignment within normal limits Skull base and vertebrae: No acute fracture. No primary bone lesion or focal pathologic process. Soft tissues and spinal canal: No prevertebral fluid or swelling. No visible canal hematoma. Disc levels: Multilevel degenerative change. Mild disc space narrowing at C3-C4 and C4-C5 with moderate severe disc space narrowing C5-C6 and C6-C7. Facet degenerative changes at multiple levels with foraminal narrowing Upper chest: Negative. Other: None IMPRESSION: 1. No CT evidence for acute intracranial abnormality.  Mild atrophy 2. Shortening of the cervical spine with  degenerative changes. No acute osseous abnormality Electronically Signed   By: Donavan Foil M.D.   On: 10/05/2021 22:55  ? ?CT Cervical Spine Wo Contrast ? ?Result Date: 10/05/2021 ?CLINICAL DATA:  Head trauma could be EXAM: CT HEAD WITHOUT CONTRAST CT CERVICAL SPINE WITHOUT CONTRAST TECHNIQUE: Multidetector CT imaging of the head and cervical spine was performed following the standard protocol without intravenous contrast. Multiplanar CT image reconstructions of the cervical spine were also generated. RADIATION DOSE REDUCTION: This exam was performed according to the departmental dose-optimization program which includes automated  exposure control, adjustment of the mA and/or kV according to patient size and/or use of iterative reconstruction technique. COMPARISON:  CT brain 07/14/2021 FINDINGS: CT HEAD FINDINGS Brain: No acute territorial inf

## 2021-10-07 NOTE — [place] FL2 (Signed)
?[place] MEDICAID FL2 LEVEL OF CARE SCREENING TOOL  ?  ? ?IDENTIFICATION  ?Patient Name: ?Marissa Hall Birthdate: 12-04-1940 Sex: female Admission Date (Current Location): ?10/05/2021  ?[place] and [place] Number: ? [place] ?  Facility and Address:  ?[facility],  [address], [place] ?     Provider Number: ?[id number]  ?Attending Physician Name and Address:  ?Flora Lipps, MD ? Relative Name and Phone Number:  ?[facility]s [facility])   [phone] ?   ?Current Level of Care: ?Hospital Recommended Level of Care: ?[place] Prior Approval Number: ?  ? ?Date Approved/Denied: ?  PASRR Number: ?pending ? ?Discharge Plan: ?SNF ?  ? ?Current Diagnoses: ?Patient Active Problem List  ? Diagnosis Date Noted  ? Closed fracture of proximal end of left femur, initial encounter ([place]) 10/06/2021  ? Hyperkalemia 10/06/2021  ? Alcohol use 10/06/2021  ? Malnutrition of moderate degree 10/06/2021  ? Syncope and collapse 06/10/2021  ? Closed fracture of multiple ribs of right side 04/30/2021  ? Acquired hypothyroidism 02/23/2021  ? Type 2 diabetes mellitus with diabetic polyneuropathy, without long-term current use of insulin ([place]) 02/23/2021  ? 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  ? Diabetes mellitus type 2 without retinopathy ([place]) 09/21/2020  ? Meibomian gland dysfunction (MGD) of both eyes 09/21/2020  ? History of COVID-19 06/23/2020  ? COVID-19 virus infection 05/27/2020  ? Piriformis syndrome of right side 05/16/2019  ? Lower extremity pain, right 04/23/2019  ? DVT (deep venous thrombosis) ([place]) 04/23/2019  ? Lumbar radiculopathy, right 04/02/2019  ? Cataracts, bilateral 07/2017  ? Other long term (current) drug therapy 06/14/2017  ? Anxiety and depression   ? History of breast cancer 03/02/2017  ? History of therapeutic radiation 03/02/2017  ? Breast pain, right 10/03/2016  ? Type 2 diabetes mellitus ([place]) 08/23/2016  ? Hypertension associated with diabetes ([place]) 08/23/2016  ?  Hypothyroidism 08/23/2016  ? Chemotherapy-induced neuropathy ([place]) 07/04/2016  ? Port catheter in place 05/09/2016  ? Peripheral neuropathy 05/09/2016  ? Breast cancer of upper-inner quadrant of right female breast ([place]) 03/09/2016  ? Major depressive disorder, recurrent ([place]) 02/27/2014  ? Uncomplicated alcohol dependence ([place]) 02/19/2014  ? Osteoporosis 2015  ? GERD (gastroesophageal reflux disease) 2013  ? TIA (transient ischemic attack) 03/26/2011  ? Epidermoid cyst of vulva 2008  ? Hyperlipidemia associated with type 2 diabetes mellitus ([place]) 1986  ? ? ?Orientation RESPIRATION BLADDER Height & Weight   ?  ?Time, Self, Situation, Place ? Normal Indwelling catheter Weight:   ?Height:     ?BEHAVIORAL SYMPTOMS/MOOD NEUROLOGICAL BOWEL NUTRITION STATUS  ?    Continent Diet (carb modified)  ?AMBULATORY STATUS COMMUNICATION OF NEEDS Skin   ?Limited Assist Verbally Surgical wounds ?  ?  ?  ?    ?     ?     ? ? ?Personal Care Assistance Level of Assistance  ?Bathing, Feeding, Dressing Bathing Assistance: Limited assistance ?Feeding assistance: Limited assistance ?Dressing Assistance: Limited assistance ?   ? ?Functional Limitations Info  ?Sight, Hearing, Speech Sight Info: Impaired ?Hearing Info: Adequate ?Speech Info: Adequate  ? ? ?SPECIAL CARE FACTORS FREQUENCY  ?PT (By licensed PT), OT (By licensed OT)   ?  ?PT Frequency: 5x/week ?OT Frequency: 5x/week ?  ?  ?  ?   ? ? ?Contractures Contractures Info: Not present  ? ? ?Additional Factors Info  ?Code Status, Allergies Code Status Info: DNR ?Allergies Info: Prednisone, Amlodipine, Hydrochlorothiazide, Penicillins, Steroids ?  ?  ?  ?   ? ?  Current Medications (10/07/2021):  This is the current hospital active medication list ?Current Facility-Administered Medications  ?Medication Dose Route Frequency Provider Last Rate Last Admin  ? [START ON 10/08/2021] acetaminophen (TYLENOL) tablet 325-650 mg  325-650 mg Oral Q6H PRN Lanae Crumbly, PA-C      ? atorvastatin (LIPITOR)  tablet 80 mg  80 mg Oral QPM Zada Finders R, MD      ? carvedilol (COREG) tablet 6.25 mg  6.25 mg Oral BID WC Pokhrel, Laxman, MD      ? cholecalciferol (VITAMIN D3) tablet 1,000 Units  1,000 Units Oral Daily Pokhrel, Laxman, MD   1,000 Units at 10/07/21 1039  ? docusate sodium (COLACE) capsule 100 mg  100 mg Oral BID Lanae Crumbly, PA-C   100 mg at 10/07/21 1039  ? DULoxetine (CYMBALTA) DR capsule 60 mg  60 mg Oral Daily Lenore Cordia, MD   60 mg at 10/07/21 1039  ? feeding supplement (ENSURE ENLIVE / ENSURE PLUS) liquid 237 mL  237 mL Oral BID BM Pokhrel, Laxman, MD      ? folic acid (FOLVITE) tablet 1 mg  1 mg Oral Daily Zada Finders R, MD   1 mg at 10/07/21 1039  ? hydrALAZINE (APRESOLINE) tablet 25 mg  25 mg Oral TID Flora Lipps, MD   25 mg at 10/07/21 1039  ? HYDROmorphone (DILAUDID) injection 0.5 mg  0.5 mg Intravenous Q4H PRN Lanae Crumbly, PA-C   0.5 mg at 10/07/21 1051  ? insulin aspart (novoLOG) injection 0-9 Units  0-9 Units Subcutaneous Q4H Lenore Cordia, MD   3 Units at 10/07/21 1310  ? [START ON 10/08/2021] irbesartan (AVAPRO) tablet 300 mg  300 mg Oral Daily Pokhrel, Laxman, MD      ? levothyroxine (SYNTHROID) tablet 125 mcg  125 mcg Oral Q0600 Lenore Cordia, MD   125 mcg at 10/07/21 9767  ? LORazepam (ATIVAN) tablet 1-4 mg  1-4 mg Oral Q1H PRN Lenore Cordia, MD   1 mg at 10/06/21 2315  ? Or  ? LORazepam (ATIVAN) injection 1-4 mg  1-4 mg Intravenous Q1H PRN Lenore Cordia, MD      ? menthol-cetylpyridinium (CEPACOL) lozenge 3 mg  1 lozenge Oral PRN Lanae Crumbly, PA-C      ? Or  ? phenol (CHLORASEPTIC) mouth spray 1 spray  1 spray Mouth/Throat PRN Lanae Crumbly, PA-C   1 spray at 10/07/21 0402  ? metoCLOPramide (REGLAN) tablet 5-10 mg  5-10 mg Oral Q8H PRN Lanae Crumbly, PA-C      ? Or  ? metoCLOPramide (REGLAN) injection 5-10 mg  5-10 mg Intravenous Q8H PRN Lanae Crumbly, PA-C      ? multivitamin with minerals tablet 1 tablet  1 tablet Oral Daily Lenore Cordia, MD   1 tablet at  10/07/21 1040  ? ondansetron (ZOFRAN) injection 4 mg  4 mg Intravenous Q6H PRN Pokhrel, Laxman, MD   4 mg at 10/06/21 0954  ? ondansetron (ZOFRAN) tablet 4 mg  4 mg Oral Q6H PRN Lanae Crumbly, PA-C      ? Or  ? ondansetron (ZOFRAN) injection 4 mg  4 mg Intravenous Q6H PRN Lanae Crumbly, PA-C      ? oxyCODONE (Oxy IR/ROXICODONE) immediate release tablet 5 mg  5 mg Oral Q4H PRN Lanae Crumbly, PA-C      ? polyethylene glycol (MIRALAX / GLYCOLAX) packet 17 g  17 g Oral Daily PRN Benjiman Core  M, PA-C      ? rivaroxaban (XARELTO) tablet 10 mg  10 mg Oral Q breakfast Lanae Crumbly, PA-C   10 mg at 10/07/21 1039  ? senna-docusate (Senokot-S) tablet 1 tablet  1 tablet Oral QHS PRN Lenore Cordia, MD      ? thiamine tablet 100 mg  100 mg Oral Daily Zada Finders R, MD   100 mg at 10/07/21 1039  ? Or  ? thiamine (B-1) injection 100 mg  100 mg Intravenous Daily Zada Finders R, MD      ? traZODone (DESYREL) tablet 50 mg  50 mg Oral QHS Lenore Cordia, MD      ? ? ? ?Discharge Medications: ?Please see discharge summary for a list of discharge medications. ? ?Relevant Imaging Results: ? ?Relevant Lab Results: ? ? ?Additional Information ?SS# [id number]; covid-19 vaccines - Pfizer x2 ? ?Tawanna Cooler, RN ? ? ? ? ?

## 2021-10-07 NOTE — Progress Notes (Signed)
PT Cancellation Note ? ?Patient Details ?Name: Joesphine Schemm ?MRN: [id number] ?DOB: 02/04/1941 ? ? ?Cancelled Treatment:    Reason Eval/Treat Not Completed: Pain limiting ability to participate.  Awaiting pain meds, return at a later time. ? ? ?Ramond Dial ?10/07/2021, 10:50 AM ? ?Mee Hives, PT PhD ?Acute Rehab Dept. Number: [facility] [phone] and [place] [phone] ? ?

## 2021-10-07 NOTE — Evaluation (Signed)
Physical Therapy Evaluation ?Patient Details ?Name: Marissa Hall ?MRN: [id number] ?DOB: 1941-03-04 ?Today's Date: 10/07/2021 ? ?History of Present Illness ? 81 yo female with onset of fall and resulting fracture proximal L femur was admitted 5/3 and given THA with direct anterior approach, WBAT.  PMHx:  falls, R DVT, EtOH, breast CA, Covid 19, depression DM, hypothyroidism, non ischemic cardiomyopathy, osteoporosis, HTN,  ?Clinical Impression ? Pt was admitted from home after sustaining a fall, having had other recent falls and a previous injury of rib fractures.  Pt is appropriate for a rehab stay given her independence and inability to have 24/7 care right now.  Pt is interested in working toward home, and understands the purpose of rehab stay to return to independence.  Follow her for acute rehab goals, focusing on strength, balance, gait quality and control of proprioception on L hip.  SNF recommended to meet these objectives, with family to follow up PRN for her.   ?   ? ?Recommendations for follow up therapy are one component of a multi-disciplinary discharge planning process, led by the attending physician.  Recommendations may be updated based on patient status, additional functional criteria and insurance authorization. ? ?Follow Up Recommendations Skilled nursing-short term rehab (<3 hours/day) ? ?  ?Assistance Recommended at Discharge Frequent or constant Supervision/Assistance  ?Patient can return home with the following ? A lot of help with walking and/or transfers;A lot of help with bathing/dressing/bathroom;Assistance with cooking/housework;Assist for transportation;Help with stairs or ramp for entrance ? ?  ?Equipment Recommendations BSC/3in1  ?Recommendations for Other Services ?    ?  ?Functional Status Assessment Patient has had a recent decline in their functional status and demonstrates the ability to make significant improvements in function in a reasonable and predictable amount of time.  ? ?   ?Precautions / Restrictions Precautions ?Precautions: Fall ?Precaution Comments: no hip precautions ?Restrictions ?Weight Bearing Restrictions: Yes ?LLE Weight Bearing: Weight bearing as tolerated  ? ?  ? ?Mobility ? Bed Mobility ?Overal bed mobility: Needs Assistance ?Bed Mobility: Supine to Sit, Sit to Supine ?  ?  ?Supine to sit: Min assist ?Sit to supine: Min assist ?  ?General bed mobility comments: minor help but using HOB elevation ?  ? ?Transfers ?Overall transfer level: Needs assistance ?Equipment used: Rolling walker (2 wheels), 1 person hand held assist ?Transfers: Sit to/from Stand, Bed to chair/wheelchair/BSC ?Sit to Stand: Mod assist ?  ?Step pivot transfers: Min assist ?  ?  ?  ?General transfer comment: mod to power up and min to steady and move walker ?  ? ?Ambulation/Gait ?Ambulation/Gait assistance: Min assist ?Gait Distance (Feet): 8 Feet ?Assistive device: Rolling walker (2 wheels), 1 person hand held assist ?Gait Pattern/deviations: Step-to pattern, Decreased stride length, Decreased weight shift to left ?Gait velocity: reduced ?Gait velocity interpretation: <1.31 ft/sec, indicative of household ambulator ?Pre-gait activities: standign balance correction ?General Gait Details: sidesteps on side of bed and then to step to chair with pt able to maneuver walker with extra time. ? ?Stairs ?  ?  ?  ?  ?  ? ?Wheelchair Mobility ?  ? ?Modified Rankin (Stroke Patients Only) ?  ? ?  ? ?Balance Overall balance assessment: Needs assistance, History of Falls ?Sitting-balance support: Feet supported ?Sitting balance-Leahy Scale: Fair ?  ?  ?Standing balance support: Bilateral upper extremity supported, During functional activity ?Standing balance-Leahy Scale: Poor ?Standing balance comment: poor balance but effectively controls with RW ?  ?  ?  ?  ?  ?  ?  ?  ?  ?  ?  ?   ? ? ? ?  Pertinent Vitals/Pain Pain Assessment ?Pain Assessment: Faces ?Faces Pain Scale: Hurts even more ?Pain Location: L hip ?Pain  Descriptors / Indicators: Grimacing, Guarding ?Pain Intervention(s): Premedicated before session, Repositioned, Limited activity within patient's tolerance, Monitored during session, Ice applied  ? ? ?Home Living Family/patient expects to be discharged to:: Private residence ?Living Arrangements: Alone ?Available Help at Discharge: Family;Available PRN/intermittently ?Type of Home: House ?Home Access: Stairs to enter ?Entrance Stairs-Rails: Can reach both ?Entrance Stairs-Number of Steps: 3 ?  ?Home Layout: One level ?Home Equipment: Conservation officer, nature (2 wheels) ?Additional Comments: will be needing to go to rehab, son involved and asking for this  ?  ?Prior Function Prior Level of Function : Independent/Modified Independent ?  ?  ?  ?  ?  ?  ?Mobility Comments: RW previously ?ADLs Comments: I for all self care ?  ? ? ?Hand Dominance  ? Dominant Hand: Right ? ?  ?Extremity/Trunk Assessment  ? Upper Extremity Assessment ?Upper Extremity Assessment: Overall WFL for tasks assessed ?  ? ?Lower Extremity Assessment ?Lower Extremity Assessment: LLE deficits/detail ?LLE Deficits / Details: strength and pain limiting active use of L hip ?LLE: Unable to fully assess due to pain ?LLE Coordination: decreased gross motor ?  ? ?Cervical / Trunk Assessment ?Cervical / Trunk Assessment: Kyphotic (mild)  ?Communication  ? Communication: No difficulties  ?Cognition Arousal/Alertness: Awake/alert ?Behavior During Therapy: [facility] for tasks assessed/performed ?Overall Cognitive Status: Within Functional Limits for tasks assessed ?  ?  ?  ?  ?  ?  ?  ?  ?  ?  ?  ?  ?  ?  ?  ?  ?General Comments: able to give history and motivated to move more ?  ?  ? ?  ?General Comments General comments (skin integrity, edema, etc.): pt is up to walk with help and controlling her pain with meds and unloading on walker well ? ?  ?Exercises    ? ?Assessment/Plan  ?  ?PT Assessment Patient needs continued PT services  ?PT Problem List Decreased  strength;Decreased range of motion;Decreased activity tolerance;Decreased balance;Decreased mobility;Decreased coordination;Decreased skin integrity;Pain;Cardiopulmonary status limiting activity ? ?   ?  ?PT Treatment Interventions DME instruction;Gait training;Stair training;Functional mobility training;Therapeutic activities;Therapeutic exercise;Balance training;Neuromuscular re-education;Patient/family education   ? ?PT Goals (Current goals can be found in the Care Plan section)  ?Acute Rehab PT Goals ?Patient Stated Goal: to get home and reduce pain ?PT Goal Formulation: With patient/family ?Time For Goal Achievement: 10/21/21 ?Potential to Achieve Goals: Good ? ?  ?Frequency Min 5X/week ?  ? ? ?Co-evaluation   ?  ?  ?  ?  ? ? ?  ?AM-PAC PT "6 Clicks" Mobility  ?Outcome Measure Help needed turning from your back to your side while in a flat bed without using bedrails?: A Little ?Help needed moving from lying on your back to sitting on the side of a flat bed without using bedrails?: A Little ?Help needed moving to and from a bed to a chair (including a wheelchair)?: A Lot ?Help needed standing up from a chair using your arms (e.g., wheelchair or bedside chair)?: A Lot ?Help needed to walk in hospital room?: A Little ?Help needed climbing 3-5 steps with a railing? : Total ?6 Click Score: 14 ? ?  ?End of Session Equipment Utilized During Treatment: Gait belt ?Activity Tolerance: Patient limited by fatigue;Patient limited by pain ?Patient left: in chair;with call bell/phone within reach;with chair alarm set;with family/visitor present ?Nurse Communication: Mobility status ?PT Visit Diagnosis: Unsteadiness  on feet (R26.81);Muscle weakness (generalized) (M62.81);Pain ?Pain - Right/Left: Left ?Pain - part of body: Hip ?  ? ?Time: 7915-0569 ?PT Time Calculation (min) (ACUTE ONLY): 33 min ? ? ?Charges:   PT Evaluation ?$PT Eval Moderate Complexity: 1 Mod ?PT Treatments ?$Therapeutic Activity: 8-22 mins ?  ?   ? ?Ramond Dial ?10/07/2021, 1:00 PM ? ?Mee Hives, PT PhD ?Acute Rehab Dept. Number: [facility] [phone] and [place] [phone] ? ? ?

## 2021-10-07 NOTE — TOC PASRR Note (Signed)
Transition of Care (TOC) -30 day Note   ?  ?  ?Patient Details  ?Name: Marissa Hall ?MRN:  [id number] ?Date of Birth: [birth year] ?  ?Transition of Care (TOC) CM/SW Contact  ?Name:  Tawanna Cooler ?Phone Number: ?Date:  10/07/2021 ?Time:  ?  ?MUST ID:  ?  ?To Whom it May Concern: ?  ?Please be advised that the above patient will require a short-term nursing home stay, anticipated 30 days or less rehabilitation and strengthening. The plan is for return home.  ?   ?

## 2021-10-08 ENCOUNTER — Inpatient Hospital Stay (HOSPITAL_COMMUNITY): Payer: Medicare Other

## 2021-10-08 DIAGNOSIS — Z789 Other specified health status: Secondary | ICD-10-CM | POA: Diagnosis not present

## 2021-10-08 DIAGNOSIS — E875 Hyperkalemia: Secondary | ICD-10-CM | POA: Diagnosis not present

## 2021-10-08 DIAGNOSIS — R55 Syncope and collapse: Secondary | ICD-10-CM | POA: Diagnosis not present

## 2021-10-08 DIAGNOSIS — S72002A Fracture of unspecified part of neck of left femur, initial encounter for closed fracture: Secondary | ICD-10-CM | POA: Diagnosis not present

## 2021-10-08 LAB — GLUCOSE, CAPILLARY
Glucose-Capillary: 173 mg/dL — ABNORMAL HIGH (ref 70–99)
Glucose-Capillary: 175 mg/dL — ABNORMAL HIGH (ref 70–99)
Glucose-Capillary: 177 mg/dL — ABNORMAL HIGH (ref 70–99)
Glucose-Capillary: 177 mg/dL — ABNORMAL HIGH (ref 70–99)
Glucose-Capillary: 185 mg/dL — ABNORMAL HIGH (ref 70–99)
Glucose-Capillary: 222 mg/dL — ABNORMAL HIGH (ref 70–99)
Glucose-Capillary: 264 mg/dL — ABNORMAL HIGH (ref 70–99)

## 2021-10-08 LAB — BASIC METABOLIC PANEL
Anion gap: 9 (ref 5–15)
BUN: 13 mg/dL (ref 8–23)
CO2: 27 mmol/L (ref 22–32)
Calcium: 8.9 mg/dL (ref 8.9–10.3)
Chloride: 97 mmol/L — ABNORMAL LOW (ref 98–111)
Creatinine, Ser: 0.74 mg/dL (ref 0.44–1.00)
GFR, Estimated: >60 mL/min (ref >60)
Glucose, Bld: 204 mg/dL — ABNORMAL HIGH (ref 70–99)
Potassium: 4.2 mmol/L (ref 3.5–5.1)
Sodium: 133 mmol/L — ABNORMAL LOW (ref 135–145)

## 2021-10-08 LAB — CBC
HCT: 30.7 % — ABNORMAL LOW (ref 36.0–46.0)
Hemoglobin: 10.2 g/dL — ABNORMAL LOW (ref 12.0–15.0)
MCH: 32.4 pg (ref 26.0–34.0)
MCHC: 33.2 g/dL (ref 30.0–36.0)
MCV: 97.5 fL (ref 80.0–100.0)
Platelets: 114 10*3/uL — ABNORMAL LOW (ref 150–400)
RBC: 3.15 MIL/uL — ABNORMAL LOW (ref 3.87–5.11)
RDW: 12.9 % (ref 11.5–15.5)
WBC: 5.7 10*3/uL (ref 4.0–10.5)
nRBC: 0 % (ref 0.0–0.2)

## 2021-10-08 LAB — MAGNESIUM: Magnesium: 2 mg/dL (ref 1.7–2.4)

## 2021-10-08 MED ORDER — IRBESARTAN 75 MG PO TABS
75.0000 mg | ORAL_TABLET | Freq: Every day | ORAL | Status: DC
  Administered 2021-10-09 – 2021-10-13 (×5): 75 mg via ORAL
  Filled 2021-10-08 (×5): qty 1

## 2021-10-08 MED ORDER — BISACODYL 10 MG RE SUPP
10.0000 mg | Freq: Once | RECTAL | Status: DC
  Filled 2021-10-08: qty 1

## 2021-10-08 MED ORDER — CARVEDILOL 3.125 MG PO TABS
3.1250 mg | ORAL_TABLET | Freq: Two times a day (BID) | ORAL | Status: DC
  Administered 2021-10-09 – 2021-10-13 (×9): 3.125 mg via ORAL
  Filled 2021-10-08 (×9): qty 1

## 2021-10-08 NOTE — TOC Progression Note (Signed)
Transition of Care (TOC) - Progression Note  ? ? ?Patient Details  ?Name: Tava Peery ?MRN: [id number] ?Date of Birth: 12-07-1940 ? ?Transition of Care (TOC) CM/SW Contact  ?Ross Ludwig, LCSW ?Phone Number: ?10/08/2021, 12:32 PM ? ?Clinical Narrative:    ? ?Level 2 [facility] number is still pending.  CSW provided bed offers to patient and family, they would like [facility] or [facility].  CSW spoke to [facility] in admissions and she said if passar number comes back they can accept her on Monday.  CSW updated patient and family.  CSW also explained to patient and family how insurance works and what to expect at [facility] for rehab.  CSW to continue to follow patient's progress throughout discharge planning. ? ?Expected Discharge Plan: [place] ?Barriers to Discharge: Continued Medical Work up ? ?Expected Discharge Plan and Services ?Expected Discharge Plan: [place] ?  ?  ?  ?Living arrangements for the past 2 months: [place] ?                ?  ?  ?  ?  ?  ?  ?  ?  ?  ?  ? ? ?Social Determinants of Health (SDOH) Interventions ?  ? ?Readmission Risk Interventions ?   ? View : No data to display.  ?  ?  ?  ? ? ?

## 2021-10-08 NOTE — Plan of Care (Signed)
  Problem: Nutrition: Goal: Adequate nutrition will be maintained Outcome: Progressing   Problem: Pain Managment: Goal: General experience of comfort will improve Outcome: Progressing   Problem: Safety: Goal: Ability to remain free from injury will improve Outcome: Progressing   

## 2021-10-08 NOTE — Progress Notes (Signed)
Physical Therapy Treatment ?Patient Details ?Name: Marissa Hall ?MRN: [id number] ?DOB: August 13, 1940 ?Today's Date: 10/08/2021 ? ? ?History of Present Illness 81 yo female with onset of fall and resulting fracture proximal L femur was admitted 5/3 and given THA with direct anterior approach, WBAT.  PMHx:  falls, R DVT, EtOH, breast CA, Covid 19, depression DM, hypothyroidism, non ischemic cardiomyopathy, osteoporosis, HTN, ? ?  ?PT Comments  ? ? Pt continues to require ModA for sit<>stand transfers due to increased pain with weight bearing. Poor tolerance for gait distance due to drop in BP.  Sitting 137/59  HR 84bpm,  Standing after 2 minutes BP 94/49 HR 97bpm.  O2 sats remained in upper 90's on 1 liter.  Pt symptomatic with blurry vision.  Per MD, pt encouraged to continue pushing fluids.  Continue to recommend short term stay at SNF once medically cleared. ? ?  ?Recommendations for follow up therapy are one component of a multi-disciplinary discharge planning process, led by the attending physician.  Recommendations may be updated based on patient status, additional functional criteria and insurance authorization. ? ?Follow Up Recommendations ? Skilled nursing-short term rehab (<3 hours/day) ?  ?  ?Assistance Recommended at Discharge Frequent or constant Supervision/Assistance  ?Patient can return home with the following A lot of help with walking and/or transfers;A lot of help with bathing/dressing/bathroom;Assistance with cooking/housework;Assist for transportation;Help with stairs or ramp for entrance ?  ?Equipment Recommendations ? None recommended by PT  ?  ?Recommendations for Other Services   ? ? ?  ?Precautions / Restrictions Precautions ?Precautions: Fall ?Restrictions ?Weight Bearing Restrictions: Yes ?LLE Weight Bearing: Weight bearing as tolerated  ?  ? ?Mobility ? Bed Mobility ?  ?  ?  ?  ?  ?  ?  ?General bed mobility comments: Pt received in chair ?  ? ?Transfers ?Overall transfer level: Needs  assistance ?Equipment used: Rolling walker (2 wheels), 1 person hand held assist ?Transfers: Sit to/from Stand, Bed to chair/wheelchair/BSC ?Sit to Stand: Mod assist ?  ?  ?  ?  ?  ?General transfer comment: mod to power up and min to steady and move walker ?  ? ?Ambulation/Gait ?Ambulation/Gait assistance: Min guard ?Gait Distance (Feet): 8 Feet ?Assistive device: Rolling walker (2 wheels) ?Gait Pattern/deviations: Step-to pattern, Decreased stride length, Decreased weight shift to left ?Gait velocity: reduced ?  ?  ?General Gait Details: Poor tolerance due to onset of dizziness ? ? ?Stairs ?  ?  ?  ?  ?  ? ? ?Wheelchair Mobility ?  ? ?Modified Rankin (Stroke Patients Only) ?  ? ? ?  ?Balance Overall balance assessment: Needs assistance, History of Falls ?Sitting-balance support: Feet supported ?Sitting balance-Leahy Scale: Fair ?  ?  ?Standing balance support: Bilateral upper extremity supported, During functional activity ?Standing balance-Leahy Scale: Poor ?Standing balance comment: poor balance but effectively controls with RW ?  ?  ?  ?  ?  ?  ?  ?  ?  ?  ?  ?  ? ?  ?Cognition Arousal/Alertness: Awake/alert ?Behavior During Therapy: [facility] for tasks assessed/performed ?Overall Cognitive Status: Within Functional Limits for tasks assessed ?  ?  ?  ?  ?  ?  ?  ?  ?  ?  ?  ?  ?  ?  ?  ?  ?General Comments: able to give history and motivated to move more ?  ?  ? ?  ?Exercises Total Joint Exercises ?Ankle Circles/Pumps: AROM, Both, 10 reps ?Quad Sets: AROM,  Both, 10 reps ?Long Arc Quad: AROM, Both, 10 reps ? ?  ?General Comments General comments (skin integrity, edema, etc.): Pt with significant drop in BP sitting to standing and symptomatic ?  ?  ? ?Pertinent Vitals/Pain Pain Assessment ?Pain Assessment: 0-10 ?Pain Score: 7  ?Pain Location: L hip ?Pain Descriptors / Indicators: Grimacing, Guarding ?Pain Intervention(s): Premedicated before session, Monitored during session  ? ? ?Home Living   ?  ?  ?  ?  ?  ?  ?  ?   ?  ?   ?  ?Prior Function    ?  ?  ?   ? ?PT Goals (current goals can now be found in the care plan section) Acute Rehab PT Goals ?Patient Stated Goal: to get home and reduce pain ? ?  ?Frequency ? ? ? Min 5X/week ? ? ? ?  ?PT Plan Current plan remains appropriate  ? ? ?Co-evaluation   ?  ?  ?  ?  ? ?  ?AM-PAC PT "6 Clicks" Mobility   ?Outcome Measure ? Help needed turning from your back to your side while in a flat bed without using bedrails?: A Little ?Help needed moving from lying on your back to sitting on the side of a flat bed without using bedrails?: A Little ?Help needed moving to and from a bed to a chair (including a wheelchair)?: A Lot ?Help needed standing up from a chair using your arms (e.g., wheelchair or bedside chair)?: A Lot ?Help needed to walk in hospital room?: A Little ?Help needed climbing 3-5 steps with a railing? : Total ?6 Click Score: 14 ? ?  ?End of Session Equipment Utilized During Treatment: Gait belt;Oxygen (1 liter) ?Activity Tolerance: Patient limited by fatigue;Patient limited by pain ?Patient left: in chair;with call bell/phone within reach;with chair alarm set;with family/visitor present ?Nurse Communication: Mobility status (BP) ?PT Visit Diagnosis: Unsteadiness on feet (R26.81);Muscle weakness (generalized) (M62.81);Pain ?Pain - Right/Left: Left ?Pain - part of body: Hip ?  ? ? ?Time: 1230-1310 ?PT Time Calculation (min) (ACUTE ONLY): 40 min ? ?Charges:  $Gait Training: 8-22 mins ?$Therapeutic Exercise: 8-22 mins ?$Therapeutic Activity: 8-22 mins          ?          ?[name], PTA ? ? ? ?Marissa Hall ?10/08/2021, 1:54 PM ? ?

## 2021-10-08 NOTE — Plan of Care (Signed)

## 2021-10-08 NOTE — Progress Notes (Signed)
?PROGRESS NOTE ? ? ? ?Marissa Hall  QMG:[id number] DOB: 03-Jun-1941 DOA: 10/05/2021 ?PCP: Tammi Sou, MD  ? ? ?Brief Narrative:  ?Marissa Hall is a 81 y.o. female with past medical history of type 2 diabetes, hypertension, hyperlipidemia, hypothyroidism, history of DVT status post 6 months of Xarelto, recurrent hyperkalemia, recurrent syncope, history of CVA, depression and anxiety and alcohol use disorder presented to hospital after sustaining a syncopal event and hurting her hip.  Has history of wearing a monitor for recurrent syncope in the past and follows up with Dr. Bettina Gavia cardiology as outpatient with mention of PACs and some PVCs.  Patient  stated that she drinks too much alcohol at home. In the ED, patient was noted to have stable vitals.  WBC was 8.8 hemoglobin of 13.1.  Creatinine 0.6.  CT head was negative for fracture.  CT pelvis showed acute fracture of the proximal left femur.  CT C-spine with multilevel degenerative changes.  Orthopedics was consulted from the ED and patient was admitted hospital for further evaluation and treatment. ? ?Assessment and Plan: ?* Closed fracture of proximal end of left femur, initial encounter ([place]) ?Patient was seen by orthopedics and underwent total hip arthroplasty on 10/06/2021.  Has been started on Xarelto for DVT prophylaxis.  PT evaluation has been ordered.  Follow orthopedic recommendations.  Awaiting PASSAR # for SNF discharge. Likely will need to wait until Monday for PASSAR #. Pt is aware. Her desire is to go to SNF at discharge. ?  ?Syncope and collapse ?History of previous syncopes and cardiac event monitor in the past showing rare PACs and PVCs.  Follows up with Dr. Bettina Gavia as outpatient.  Discouraged use of alcohol.  No chest pain during this hospitalization.   Will need to follow-up with cardiology as outpatient.  IV fluids discontinued. ? ?Alcohol use ?Reports drinking at least a bottle of wine and several beers daily.  Last drink night of  admission prior to fall.  Continue on CIWA protocol and Ativan.  Denies any hallucinations tremors sweating or active signs of withdrawal. ? ?Diabetes mellitus type 2 without retinopathy ([place]) ?Continue sliding scale insulin at this time.  Patient takes metformin at home.  We will continue to monitor while in the hospital.   ?  ?Hypertension associated with diabetes ([place]) ? Takes Coreg, valsartan and hydralazine at home.  Today, pt orthostatic with PT. Will need to reduce home HTN meds. ? ?Hyperlipidemia associated with type 2 diabetes mellitus ([place]) ?Continue Lipitor. ?  ?Hypothyroidism ?Continue Synthroid. ?  ?Anxiety and depression ?Continue Cymbalta and trazodone.  ? ?  ? DVT prophylaxis: rivaroxaban (XARELTO) tablet 10 mg Start: 10/07/21 0800 ?SCDs Start: 10/06/21 1756 ?Place TED hose Start: 10/06/21 1756 ?SCDs Start: 10/06/21 0105 ?rivaroxaban (XARELTO) tablet 10 mg  ? ?Code Status:   ?  Code Status: DNR ? ?Disposition: Orthopedics ?Status is: Inpatient ?Remains inpatient appropriate because: Status post hip surgery, ? ? Family Communication:  ?No family at bedside ? ?Consultants:  ?Orthopedics ? ?Procedures:  ?Left Total hip arthroplasty on 10/06/2021 by Dr. Lorin Mercy orthopedics. ? ?Antimicrobials:  ?None ? ?Anti-infectives (From admission, onward)  ? ? Start     Dose/Rate Route Frequency Ordered Stop  ? 10/06/21 1315  ceFAZolin (ANCEF) IVPB 2g/100 mL premix       ? 2 g ?200 mL/hr over 30 Minutes Intravenous On call to O.R. 10/06/21 1259 10/06/21 1515  ? ?  ? ?Subjective: ?Today, patient was seen and examined at bedside.  Had orthostatic hypotension with  PT today.  Home HTN meds reduced. Still awaiting PASSAR number for [facility] discharge. ? ?Objective: ?Vitals:  ? 10/07/21 2124 10/08/21 6644 10/08/21 0515 10/08/21 0827  ?BP: 138/60 (!) 114/54  (!) 128/57  ?Pulse: 83 84 83 84  ?Resp:  16  20  ?Temp:  98.1 ?F (36.7 ?C)  98.2 ?F (36.8 ?C)  ?TempSrc:    Oral  ?SpO2:  97% 97% 97%  ? ? ?Intake/Output Summary (Last 24 hours)  at 10/08/2021 1439 ?Last data filed at 10/08/2021 0900 ?Gross per 24 hour  ?Intake 0 ml  ?Output 725 ml  ?Net -725 ml  ? ?There were no vitals filed for this visit. ? ?Physical Examination: ?Physical Exam ?Vitals and nursing note reviewed.  ?Constitutional:   ?   General: She is not in acute distress. ?   Appearance: Normal appearance. She is normal weight. She is not ill-appearing, toxic-appearing or diaphoretic.  ?HENT:  ?   Head: Normocephalic and atraumatic.  ?   Nose: Nose normal. No rhinorrhea.  ?Eyes:  ?   General: No scleral icterus. ?Cardiovascular:  ?   Rate and Rhythm: Normal rate and regular rhythm.  ?   Pulses: Normal pulses.  ?Pulmonary:  ?   Effort: Pulmonary effort is normal. No respiratory distress.  ?   Breath sounds: Normal breath sounds. No wheezing or rales.  ?Abdominal:  ?   General: Bowel sounds are normal. There is no distension.  ?   Palpations: Abdomen is soft.  ?   Tenderness: There is no abdominal tenderness.  ?Skin: ?   General: Skin is warm and dry.  ?Neurological:  ?   General: No focal deficit present.  ?   Mental Status: She is alert and oriented to person, place, and time.  ? ? ?Data Reviewed:  ? ?CBC: ?Recent Labs  ?Lab 10/06/21 ?0347 10/07/21 ?4259 10/08/21 ?5638  ?WBC 8.8 4.9 5.7  ?HGB 13.1 10.6* 10.2*  ?HCT 39.0 31.8* 30.7*  ?MCV 97.7 98.8 97.5  ?PLT 165 122* 114*  ? ? ?Basic Metabolic Panel: ?Recent Labs  ?Lab 10/05/21 ?1148 10/06/21 ?7564 10/07/21 ?3329 10/08/21 ?5188  ?NA 137 133* 136 133*  ?K 5.8* 4.2 4.4 4.2  ?CL 101 100 104 97*  ?CO2 28 21* 25 27  ?GLUCOSE 133* 228* 163* 204*  ?BUN '21 19 14 13  '$ ?CREATININE 1.12 0.65 0.92 0.74  ?CALCIUM 9.6 9.1 8.6* 8.9  ?MG  --   --   --  2.0  ? ? ?Liver Function Tests: ?No results for input(s): AST, ALT, ALKPHOS, BILITOT, PROT, ALBUMIN in the last 168 hours. ? ? ?Radiology Studies: ?DG C-Arm 1-60 Min-No Report ? ?Result Date: 10/06/2021 ?Fluoroscopy was utilized by the requesting physician.  No radiographic interpretation.  ? ?DG C-Arm 1-60  Min-No Report ? ?Result Date: 10/06/2021 ?Fluoroscopy was utilized by the requesting physician.  No radiographic interpretation.  ? ?DG HIP UNILAT WITH PELVIS 1V LEFT ? ?Result Date: 10/06/2021 ?CLINICAL DATA:  Left hip arthroplasty. EXAM: DG HIP (WITH OR WITHOUT PELVIS) 1V*L* COMPARISON:  10/05/2021. FINDINGS: Nineteen intraoperative fluoroscopic spot views of the left hip show the stages of a left total hip arthroplasty. IMPRESSION: Intraoperative visualization for left total hip arthroplasty. Electronically Signed   By: Lorin Picket M.D.   On: 10/06/2021 16:37  ? ?DG Hip Port Unilat With Pelvis 1V Left ? ?Result Date: 10/06/2021 ?CLINICAL DATA:  Total hip replacement.  Postoperative check. EXAM: DG HIP (WITH OR WITHOUT PELVIS) 1V PORT LEFT COMPARISON:  Pelvis and  left hip radiographs 10/05/2021; CT pelvis 10/05/2021 FINDINGS: Interval total left hip arthroplasty. No perihardware lucency is seen to indicate hardware failure or loosening. Expected postoperative changes including lateral left hip soft tissue swelling and subcutaneous air. Mild right femoroacetabular joint space narrowing. IMPRESSION: Status post total left hip arthroplasty without evidence of hardware failure. Electronically Signed   By: Yvonne Kendall M.D.   On: 10/06/2021 17:01   ? ? ? LOS: 2 days  ? ? ?Kristopher Oppenheim, MD ?[facility] ?Available via Epic secure chat 7am-7pm ?After these hours, please refer to coverage provider listed on [email or web address] ?10/08/2021, 2:39 PM  ?  ?

## 2021-10-08 NOTE — Progress Notes (Signed)
Subjective: ?Patient planes of moderate to severe pain left hip with movement and weightbearing no bowel movement since Tuesday.  Okay.  No nausea..  ? ?Objective: ?Vital signs in last 24 hours: ?Temp:  [98.1 ?F (36.7 ?C)-99.3 ?F (37.4 ?C)] 98.2 ?F (36.8 ?C) (05/05 0827) ?Pulse Rate:  [83-91] 84 (05/05 0827) ?Resp:  [16-22] 20 (05/05 0827) ?BP: (114-141)/(54-63) 128/57 (05/05 0827) ?SpO2:  [97 %-98 %] 97 % (05/05 0827) ? ?Intake/Output from previous day: ?05/04 0701 - 05/05 0700 ?In: 240 [P.O.:240] ?Out: 725 [Urine:725] ?Intake/Output this shift: ?No intake/output data recorded. ? ?Recent Labs  ?  10/06/21 ?2633 10/07/21 ?3545 10/08/21 ?6256  ?HGB 13.1 10.6* 10.2*  ? ?Recent Labs  ?  10/07/21 ?3893 10/08/21 ?7342  ?WBC 4.9 5.7  ?RBC 3.22* 3.15*  ?HCT 31.8* 30.7*  ?PLT 122* 114*  ? ?Recent Labs  ?  10/07/21 ?8768 10/08/21 ?1157  ?NA 136 133*  ?K 4.4 4.2  ?CL 104 97*  ?CO2 25 27  ?BUN 14 13  ?CREATININE 0.92 0.74  ?GLUCOSE 163* 204*  ?CALCIUM 8.6* 8.9  ? ?No results for input(s): LABPT, INR in the last 72 hours. ? ?Exam ?Very pleasant female alert and oriented in no acute distress.  Left hip wound was good.  Staples intact.  No drainage or signs infection.  Patient does have moderate hip swelling.  This area marked tenderness to palpation.  Calf nontender.  Neurovascular intact. ? ? ? ?Assessment/Plan: ?-With patient's increased hip pain I will just go ahead and get an x-ray. ?-Discontinue Dilaudid IV and will use oxycodone. ?-Order Dulcolax suppository. ?-Dressing change left hip with Mepilex ?-Awaiting discharge planning for skilled facility ? ? ? ?Benjiman Core ?10/08/2021, 2:42 PM  ? ? ? ? ?

## 2021-10-09 DIAGNOSIS — S72002A Fracture of unspecified part of neck of left femur, initial encounter for closed fracture: Secondary | ICD-10-CM | POA: Diagnosis not present

## 2021-10-09 LAB — GLUCOSE, CAPILLARY
Glucose-Capillary: 174 mg/dL — ABNORMAL HIGH (ref 70–99)
Glucose-Capillary: 157 mg/dL — ABNORMAL HIGH (ref 70–99)
Glucose-Capillary: 304 mg/dL — ABNORMAL HIGH (ref 70–99)
Glucose-Capillary: 253 mg/dL — ABNORMAL HIGH (ref 70–99)
Glucose-Capillary: 239 mg/dL — ABNORMAL HIGH (ref 70–99)

## 2021-10-09 NOTE — Progress Notes (Signed)
? ? ? ?  Subjective: ?3 Days Post-Op Procedure(s) (LRB): ?TOTAL HIP ARTHROPLASTY ANTERIOR APPROACH (Left) ?POD#3 left THR via anterior approach.   ?Patient reports pain as moderate.   ? ?Objective:  ? ?VITALS:  Temp:  [97.9 ?F (36.6 ?C)-99.1 ?F (37.3 ?C)] 97.9 ?F (36.6 ?C) (05/06 4142) ?Pulse Rate:  [86-90] 87 (05/06 0437) ?Resp:  [16-20] 16 (05/06 0437) ?BP: (111-150)/(51-66) 111/51 (05/06 0437) ?SpO2:  [94 %-96 %] 94 % (05/06 0437) ? ?Neurologically intact ?ABD soft ?Neurovascular intact ?Sensation intact distally ?Intact pulses distally ?Dorsiflexion/Plantar flexion intact ?Incision: scant drainage and will change bandage tomorrow ?No cellulitis present ? ? ?LABS ?Recent Labs  ?  10/07/21 ?3953 10/08/21 ?2023  ?HGB 10.6* 10.2*  ?WBC 4.9 5.7  ?PLT 122* 114*  ? ?Recent Labs  ?  10/07/21 ?3435 10/08/21 ?6861  ?NA 136 133*  ?K 4.4 4.2  ?CL 104 97*  ?CO2 25 27  ?BUN 14 13  ?CREATININE 0.92 0.74  ?GLUCOSE 163* 204*  ? ?No results for input(s): LABPT, INR in the last 72 hours. ? ? ?Assessment/Plan: ?3 Days Post-Op Procedure(s) (LRB): ?TOTAL HIP ARTHROPLASTY ANTERIOR APPROACH (Left) ? ?Advance diet ?Up with therapy ?Discharge to SNF ? ?Basil Dess ?10/09/2021, 1:41 PM Patient ID: Marissa Hall, female   DOB: Jul 12, 1940, 81 y.o.   MRN: [id number] ? ?

## 2021-10-09 NOTE — Progress Notes (Signed)
?PROGRESS NOTE ? ? ? ?Marissa Hall  JGG:[id number] DOB: May 04, 1941 DOA: 10/05/2021 ?PCP: Tammi Sou, MD  ? ? ?Brief Narrative:  ?Marissa Hall is a 81 y.o. female with past medical history of type 2 diabetes, hypertension, hyperlipidemia, hypothyroidism, history of DVT status post 6 months of Xarelto, recurrent hyperkalemia, recurrent syncope, history of CVA, depression and anxiety and alcohol use disorder presented to hospital after sustaining a syncopal event and hurting her hip.  Has history of wearing a monitor for recurrent syncope in the past and follows up with Dr. Bettina Gavia cardiology as outpatient with mention of PACs and some PVCs.  Patient  stated that she drinks too much alcohol at home. In the ED, patient was noted to have stable vitals.  WBC was 8.8 hemoglobin of 13.1.  Creatinine 0.6.  CT head was negative for fracture.  CT pelvis showed acute fracture of the proximal left femur.  CT C-spine with multilevel degenerative changes.  Orthopedics was consulted from the ED and patient was admitted hospital for further evaluation and treatment. ? ?Assessment and Plan: ?* Closed fracture of proximal end of left femur, initial encounter ([place]) ?Patient was seen by orthopedics and underwent total hip arthroplasty on 10/06/2021.  Has been started on Xarelto for DVT prophylaxis.  PT evaluation has been ordered.  Follow orthopedic recommendations.  Awaiting PASSAR # for SNF discharge. Likely will need to wait until Monday for PASSAR #. Pt is aware. Her desire is to go to SNF at discharge. ?  ?Syncope and collapse ?History of previous syncopes and cardiac event monitor in the past showing rare PACs and PVCs.  Follows up with Dr. Bettina Gavia as outpatient.  Discouraged use of alcohol.  No chest pain during this hospitalization.   Will need to follow-up with cardiology as outpatient.  IV fluids discontinued. ? ?Alcohol use ?Reports drinking at least a bottle of wine and several beers daily.  Last drink night of  admission prior to fall.  Continue on CIWA protocol and Ativan.  Denies any hallucinations tremors sweating or active signs of withdrawal. ? ?Diabetes mellitus type 2 without retinopathy ([place]) ?Continue sliding scale insulin at this time.  Patient takes metformin at home.  We will continue to monitor while in the hospital.   ?  ?Hypertension associated with diabetes ([place]) ? Takes Coreg, valsartan and hydralazine at home.  Today, pt orthostatic with PT. Will need to reduce home HTN meds. ? ?Hyperlipidemia associated with type 2 diabetes mellitus ([place]) ?Continue Lipitor. ?  ?Hypothyroidism ?Continue Synthroid. ?  ?Anxiety and depression ?Continue Cymbalta and trazodone.  ? ?  ? DVT prophylaxis: rivaroxaban (XARELTO) tablet 10 mg Start: 10/07/21 0800 ?SCDs Start: 10/06/21 1756 ?Place TED hose Start: 10/06/21 1756 ?SCDs Start: 10/06/21 0105 ?rivaroxaban (XARELTO) tablet 10 mg  ? ?Code Status:   ?  Code Status: DNR ? ?Disposition: Orthopedics ?Status is: Inpatient ?Remains inpatient appropriate because: Status post hip surgery, ? ? Family Communication:  ?No family at bedside ? ?Consultants:  ?Orthopedics ? ?Procedures:  ?Left Total hip arthroplasty on 10/06/2021 by Dr. Lorin Mercy orthopedics. ? ?Antimicrobials:  ?None ? ?Anti-infectives (From admission, onward)  ? ? Start     Dose/Rate Route Frequency Ordered Stop  ? 10/06/21 1315  ceFAZolin (ANCEF) IVPB 2g/100 mL premix       ? 2 g ?200 mL/hr over 30 Minutes Intravenous On call to O.R. 10/06/21 1259 10/06/21 1515  ? ?  ? ?Subjective: ? ?No interval changes, no new complaints ,awaiting for placement ? Still awaiting  PASSAR number for [facility] discharge. ? ?Objective: ?Vitals:  ? 10/08/21 1636 10/08/21 2026 10/09/21 0437 10/09/21 1404  ?BP: 132/66 (!) 150/54 (!) 111/51 (!) 143/58  ?Pulse: 86 90 87 79  ?Resp: '20 18 16 19  '$ ?Temp: 99.1 ?F (37.3 ?C) 98.6 ?F (37 ?C) 97.9 ?F (36.6 ?C) 98.7 ?F (37.1 ?C)  ?TempSrc: Oral Oral Oral Oral  ?SpO2: 95% 96% 94% 95%  ? ? ?Intake/Output Summary  (Last 24 hours) at 10/09/2021 1907 ?Last data filed at 10/09/2021 1544 ?Gross per 24 hour  ?Intake --  ?Output 1350 ml  ?Net -1350 ml  ? ? ?There were no vitals filed for this visit. ? ?Physical Examination: ?Physical Exam ?Vitals and nursing note reviewed.  ?Constitutional:   ?   General: She is not in acute distress. ?   Appearance: Normal appearance. She is normal weight. She is not ill-appearing, toxic-appearing or diaphoretic.  ?HENT:  ?   Head: Normocephalic and atraumatic.  ?   Nose: Nose normal. No rhinorrhea.  ?Eyes:  ?   General: No scleral icterus. ?Cardiovascular:  ?   Rate and Rhythm: Normal rate and regular rhythm.  ?   Pulses: Normal pulses.  ?Pulmonary:  ?   Effort: Pulmonary effort is normal. No respiratory distress.  ?   Breath sounds: Normal breath sounds. No wheezing or rales.  ?Abdominal:  ?   General: Bowel sounds are normal. There is no distension.  ?   Palpations: Abdomen is soft.  ?   Tenderness: There is no abdominal tenderness.  ?Skin: ?   General: Skin is warm and dry.  ?Neurological:  ?   General: No focal deficit present.  ?   Mental Status: She is alert and oriented to person, place, and time.  ? ? ?Data Reviewed:  ? ?CBC: ?Recent Labs  ?Lab 10/06/21 ?0092 10/07/21 ?3300 10/08/21 ?7622  ?WBC 8.8 4.9 5.7  ?HGB 13.1 10.6* 10.2*  ?HCT 39.0 31.8* 30.7*  ?MCV 97.7 98.8 97.5  ?PLT 165 122* 114*  ? ? ? ?Basic Metabolic Panel: ?Recent Labs  ?Lab 10/05/21 ?1148 10/06/21 ?6333 10/07/21 ?5456 10/08/21 ?2563  ?NA 137 133* 136 133*  ?K 5.8* 4.2 4.4 4.2  ?CL 101 100 104 97*  ?CO2 28 21* 25 27  ?GLUCOSE 133* 228* 163* 204*  ?BUN '21 19 14 13  '$ ?CREATININE 1.12 0.65 0.92 0.74  ?CALCIUM 9.6 9.1 8.6* 8.9  ?MG  --   --   --  2.0  ? ? ? ?Liver Function Tests: ?No results for input(s): AST, ALT, ALKPHOS, BILITOT, PROT, ALBUMIN in the last 168 hours. ? ? ?Radiology Studies: ?DG HIP PORT UNILAT WITH PELVIS 1V LEFT ? ?Result Date: 10/08/2021 ?CLINICAL DATA:  Left hip pain.  Recent arthroplasty. EXAM: DG HIP (WITH OR  WITHOUT PELVIS) 1V PORT LEFT COMPARISON:  Left hip x-rays dated Oct 06, 2021. FINDINGS: Left hip total arthroplasty. No evidence of hardware failure or loosening. No acute fracture or dislocation. Unchanged mild right hip osteoarthritis. Decreasing subcutaneous emphysema overlying the lateral left hip. IMPRESSION: 1. Left hip total arthroplasty without hardware complication. Electronically Signed   By: Titus Dubin M.D.   On: 10/08/2021 16:45   ? ? ? LOS: 3 days  ? ? ?Florencia Reasons, MD PhD FACP ?[facility] ?Available via Epic secure chat 7am-7pm ?After these hours, please refer to coverage provider listed on [email or web address] ?10/09/2021, 7:07 PM  ? ?

## 2021-10-09 NOTE — Plan of Care (Signed)

## 2021-10-10 LAB — BASIC METABOLIC PANEL
Anion gap: 6 (ref 5–15)
BUN: 18 mg/dL (ref 8–23)
CO2: 31 mmol/L (ref 22–32)
Calcium: 9.1 mg/dL (ref 8.9–10.3)
Chloride: 97 mmol/L — ABNORMAL LOW (ref 98–111)
Creatinine, Ser: 0.74 mg/dL (ref 0.44–1.00)
GFR, Estimated: >60 mL/min (ref >60)
Glucose, Bld: 208 mg/dL — ABNORMAL HIGH (ref 70–99)
Potassium: 4.7 mmol/L (ref 3.5–5.1)
Sodium: 134 mmol/L — ABNORMAL LOW (ref 135–145)

## 2021-10-10 LAB — CBC
HCT: 27.2 % — ABNORMAL LOW (ref 36.0–46.0)
Hemoglobin: 9.2 g/dL — ABNORMAL LOW (ref 12.0–15.0)
MCH: 33.5 pg (ref 26.0–34.0)
MCHC: 33.8 g/dL (ref 30.0–36.0)
MCV: 98.9 fL (ref 80.0–100.0)
Platelets: 134 10*3/uL — ABNORMAL LOW (ref 150–400)
RBC: 2.75 MIL/uL — ABNORMAL LOW (ref 3.87–5.11)
RDW: 12.6 % (ref 11.5–15.5)
WBC: 4.8 10*3/uL (ref 4.0–10.5)
nRBC: 0 % (ref 0.0–0.2)

## 2021-10-10 LAB — GLUCOSE, CAPILLARY
Glucose-Capillary: 174 mg/dL — ABNORMAL HIGH (ref 70–99)
Glucose-Capillary: 178 mg/dL — ABNORMAL HIGH (ref 70–99)
Glucose-Capillary: 184 mg/dL — ABNORMAL HIGH (ref 70–99)
Glucose-Capillary: 186 mg/dL — ABNORMAL HIGH (ref 70–99)
Glucose-Capillary: 208 mg/dL — ABNORMAL HIGH (ref 70–99)
Glucose-Capillary: 274 mg/dL — ABNORMAL HIGH (ref 70–99)

## 2021-10-10 MED ORDER — INSULIN ASPART 100 UNIT/ML IJ SOLN
0.0000 [IU] | Freq: Three times a day (TID) | INTRAMUSCULAR | Status: DC
  Administered 2021-10-10 (×2): 2 [IU] via SUBCUTANEOUS
  Administered 2021-10-11 (×2): 5 [IU] via SUBCUTANEOUS
  Administered 2021-10-11: 2 [IU] via SUBCUTANEOUS
  Administered 2021-10-11: 3 [IU] via SUBCUTANEOUS
  Administered 2021-10-12 (×2): 2 [IU] via SUBCUTANEOUS
  Administered 2021-10-12 (×2): 3 [IU] via SUBCUTANEOUS
  Administered 2021-10-13: 2 [IU] via SUBCUTANEOUS
  Administered 2021-10-13: 5 [IU] via SUBCUTANEOUS

## 2021-10-10 MED ORDER — TRAZODONE HCL 50 MG PO TABS
50.0000 mg | ORAL_TABLET | Freq: Every evening | ORAL | Status: DC | PRN
  Filled 2021-10-10: qty 1

## 2021-10-10 NOTE — Progress Notes (Signed)
? ? ? ?  Subjective: ?4 Days Post-Op Procedure(s) (LRB): ?TOTAL HIP ARTHROPLASTY ANTERIOR APPROACH (Left) ?Awake, alert and oriented x 4. Dressing changed, incision is dry,nice size. New AG Cell dressing applied does not need change till return to office for staple removal.  ? ?Patient reports pain as mild.   ? ?Objective:  ? ?VITALS:  Temp:  [98.3 ?F (36.8 ?C)-98.7 ?F (37.1 ?C)] 98.3 ?F (36.8 ?C) (05/07 0434) ?Pulse Rate:  [72-80] 72 (05/07 0434) ?Resp:  [18-19] 18 (05/07 0434) ?BP: (119-143)/(54-58) 122/54 (05/07 0434) ?SpO2:  [95 %-96 %] 95 % (05/07 0434) ? ?Neurologically intact ?ABD soft ?Neurovascular intact ?Sensation intact distally ?Intact pulses distally ?Dorsiflexion/Plantar flexion intact ?Incision: dressing C/D/I and no drainage ? ? ?LABS ?Recent Labs  ?  10/08/21 ?0258 10/10/21 ?5277  ?HGB 10.2* 9.2*  ?WBC 5.7 4.8  ?PLT 114* 134*  ? ?Recent Labs  ?  10/08/21 ?8242 10/10/21 ?3536  ?NA 133* 134*  ?K 4.2 4.7  ?CL 97* 97*  ?CO2 27 31  ?BUN 13 18  ?CREATININE 0.74 0.74  ?GLUCOSE 204* 208*  ? ?No results for input(s): LABPT, INR in the last 72 hours. ? ? ?Assessment/Plan: ?4 Days Post-Op Procedure(s) (LRB): ?TOTAL HIP ARTHROPLASTY ANTERIOR APPROACH (Left) ? ?Advance diet ?Up with therapy ?Discharge to SNF ? ?Basil Dess ?10/10/2021, 10:12 AM Patient ID: Marissa Hall, female   DOB: Feb 06, 1941, 81 y.o.   MRN: [id number] ? ?

## 2021-10-10 NOTE — Plan of Care (Signed)

## 2021-10-10 NOTE — Progress Notes (Signed)
Physical Therapy Treatment ?Patient Details ?Name: Marissa Hall ?MRN: [id number] ?DOB: [birth year] ?Today's Date: 10/10/2021 ? ? ?History of Present Illness 81 yo female with onset of fall and resulting fracture proximal L femur was admitted 5/3 and given THA with direct anterior approach, WBAT.  PMHx:  falls, R DVT, EtOH, breast CA, Covid 19, depression DM, hypothyroidism, non ischemic cardiomyopathy, osteoporosis, HTN, ? ?  ?PT Comments  ? ? 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  She lives home alone and will need [facility] prior to safely returning. ?Assisted OOB to amb was difficult and required increased time.  General Gait Details: very limited amb distance due to effort, pain and feeling "woozy".  3 min standing BP was 96/53, HR 88 and RA 96%. Assisted back to bed per pt request as she was OOB in recliner most of the morning. ?  ?Recommendations for follow up therapy are one component of a multi-disciplinary discharge planning process, led by the attending physician.  Recommendations may be updated based on patient status, additional functional criteria and insurance authorization. ? ?Follow Up Recommendations ? Skilled nursing-short term rehab (<3 hours/day) ?  ?  ?Assistance Recommended at Discharge Frequent or constant Supervision/Assistance  ?Patient can return home with the following A lot of help with walking and/or transfers;A lot of help with bathing/dressing/bathroom;Assistance with cooking/housework;Assist for transportation;Help with stairs or ramp for entrance ?  ?Equipment Recommendations ? None recommended by PT  ?  ?Recommendations for Other Services   ? ? ?  ?Precautions / Restrictions Precautions ?Precautions: Fall ?Precaution Comments: dizzy ?Restrictions ?Weight Bearing Restrictions: No ?LLE Weight Bearing: Weight bearing as tolerated  ?  ? ?Mobility ? Bed Mobility ?Overal bed mobility: Needs Assistance ?Bed Mobility: Supine to Sit ?  ?  ?Supine to  sit: Min assist ?  ?  ?General bed mobility comments: demonsatred and instructed how to use a belt to self assist LE ?  ? ?Transfers ?Overall transfer level: Needs assistance ?Equipment used: Rolling walker (2 wheels) ?Transfers: Sit to/from Stand ?Sit to Stand: Min assist, Mod assist ?  ?  ?  ?  ?  ?General transfer comment: 50% VC's on proper hand placement as well as safety with turns. ?  ? ?Ambulation/Gait ?Ambulation/Gait assistance: Min guard ?Gait Distance (Feet): 11 Feet ?Assistive device: Rolling walker (2 wheels) ?Gait Pattern/deviations: Step-to pattern, Decreased stride length, Decreased weight shift to left ?Gait velocity: decreased ?  ?  ?General Gait Details: very limited amb distance due to effort, pain and feeling "woozy".  3 min standing BP was 96/53, HR 88 and RA 96%. ? ? ?Stairs ?  ?  ?  ?  ?  ? ? ?Wheelchair Mobility ?  ? ?Modified Rankin (Stroke Patients Only) ?  ? ? ?  ?Balance   ?  ?  ?  ?  ?  ?  ?  ?  ?  ?  ?  ?  ?  ?  ?  ?  ?  ?  ?  ? ?  ?Cognition Arousal/Alertness: Awake/alert ?Behavior During Therapy: [facility] for tasks assessed/performed ?Overall Cognitive Status: Within Functional Limits for tasks assessed ?  ?  ?  ?  ?  ?  ?  ?  ?  ?  ?  ?  ?  ?  ?  ?  ?General Comments: AxO x 3 very pleasant Lady.  Hx multiple falls. ?  ?  ? ?  ?Exercises   ? ?  ?  General Comments   ?  ?  ? ?Pertinent Vitals/Pain Pain Assessment ?Pain Assessment: Faces ?Faces Pain Scale: Hurts little more ?Pain Location: L hip ?Pain Descriptors / Indicators: Grimacing, Guarding, Operative site guarding ?Pain Intervention(s): Monitored during session, Premedicated before session, Repositioned, Ice applied  ? ? ?Home Living Family/patient expects to be discharged to:: Private residence ?Living Arrangements: Alone ?  ?  ?  ?  ?  ?  ?  ?  ?   ?  ?Prior Function    ?  ?  ?   ? ?PT Goals (current goals can now be found in the care plan section) Progress towards PT goals: Progressing toward goals ? ?  ?Frequency ? ? ? Min  5X/week ? ? ? ?  ?PT Plan Current plan remains appropriate  ? ? ?Co-evaluation   ?  ?  ?  ?  ? ?  ?AM-PAC PT "6 Clicks" Mobility   ?Outcome Measure ? Help needed turning from your back to your side while in a flat bed without using bedrails?: A Little ?Help needed moving from lying on your back to sitting on the side of a flat bed without using bedrails?: A Little ?Help needed moving to and from a bed to a chair (including a wheelchair)?: A Little ?Help needed standing up from a chair using your arms (e.g., wheelchair or bedside chair)?: A Lot ?Help needed to walk in hospital room?: A Lot ?Help needed climbing 3-5 steps with a railing? : Total ?6 Click Score: 14 ? ?  ?End of Session Equipment Utilized During Treatment: Gait belt ?Activity Tolerance: Patient limited by fatigue;Patient limited by pain ?Patient left: in bed;with bed alarm set;with call bell/phone within reach ?Nurse Communication: Mobility status ?PT Visit Diagnosis: Unsteadiness on feet (R26.81);Muscle weakness (generalized) (M62.81);Pain ?Pain - Right/Left: Left ?Pain - part of body: Hip ?  ? ? ?Time: 1335-1400 ?PT Time Calculation (min) (ACUTE ONLY): 25 min ? ?Charges:  $Gait Training: 8-22 mins ?$Therapeutic Activity: 8-22 mins          ?          ? ?{Eleanora Guinyard  PTA ?Acute  Rehabilitation Services ?Pager      [phone] ?Office      [phone] ? ? ?

## 2021-10-10 NOTE — TOC Progression Note (Signed)
Transition of Care (TOC) - Progression Note  ? ? ?Patient Details  ?Name: Marissa Hall ?MRN: [id number] ?Date of Birth: [birth year] ? ?Transition of Care (TOC) CM/SW Contact  ?Ross Ludwig, LCSW ?Phone Number: ?10/10/2021, 6:18 PM ? ?Clinical Narrative:    ? ?Passar number still not back.  CSW continuing to follow patient's progress throughout discharge planning.  Plan to go to [facility] once Passar back.  CSW updated patient's son yesterday. ? ?Expected Discharge Plan: [place] ?Barriers to Discharge: Continued Medical Work up ? ?Expected Discharge Plan and Services ?Expected Discharge Plan: [place] ?  ?  ?  ?Living arrangements for the past 2 months: [place] ?                ?  ?  ?  ?  ?  ?  ?  ?  ?  ?  ? ? ?Social Determinants of Health (SDOH) Interventions ?  ? ?Readmission Risk Interventions ?   ? View : No data to display.  ?  ?  ?  ? ? ?

## 2021-10-10 NOTE — TOC Progression Note (Signed)
Transition of Care (TOC) - Progression Note  ? ? ?Patient Details  ?Name: Marissa Hall ?MRN: [id number] ?Date of Birth: [birth year] ? ?Transition of Care (TOC) CM/SW Contact  ?Ross Ludwig, LCSW ?Phone Number: ?10/09/2021 3:45pm ? ?Clinical Narrative:    ? ?CSW received phone call from patient's son to see if Passar number was back yet.  CSW updated him that it is not, hopefully it will be back on Monday.  Patient has a bed at [facility], once Passar number is received. ? ? ?Expected Discharge Plan: [place] ?Barriers to Discharge: Continued Medical Work up ? ?Expected Discharge Plan and Services ?Expected Discharge Plan: [place] ?  ?  ?  ?Living arrangements for the past 2 months: [place] ?                ?  ?  ?  ?  ?  ?  ?  ?  ?  ?  ? ? ?Social Determinants of Health (SDOH) Interventions ?  ? ?Readmission Risk Interventions ?   ? View : No data to display.  ?  ?  ?  ? ? ?

## 2021-10-10 NOTE — Progress Notes (Signed)
?PROGRESS NOTE ? ? ? ?Marissa Hall  ZOX:[id number] DOB: November 27, 1940 DOA: 10/05/2021 ?PCP: Tammi Sou, MD  ? ? ?Brief Narrative:  ?Marissa Hall is a 81 y.o. female with past medical history of type 2 diabetes, hypertension, hyperlipidemia, hypothyroidism, history of DVT status post 6 months of Xarelto, recurrent hyperkalemia, recurrent syncope, history of CVA, depression and anxiety and alcohol use disorder presented to hospital after sustaining a syncopal event and hurting her hip.  Has history of wearing a monitor for recurrent syncope in the past and follows up with Dr. Bettina Gavia cardiology as outpatient with mention of PACs and some PVCs.  Patient  stated that she drinks too much alcohol at home. In the ED, patient was noted to have stable vitals.  WBC was 8.8 hemoglobin of 13.1.  Creatinine 0.6.  CT head was negative for fracture.  CT pelvis showed acute fracture of the proximal left femur.  CT C-spine with multilevel degenerative changes.  Orthopedics was consulted from the ED and patient was admitted hospital for further evaluation and treatment. ? ?Assessment and Plan: ?* Closed fracture of proximal end of left femur, initial encounter ([place]) ?Patient was seen by orthopedics and underwent total hip arthroplasty on 10/06/2021.  Has been started on Xarelto for DVT prophylaxis.  PT evaluation has been ordered.  Follow orthopedic recommendations.  Awaiting PASSAR # for SNF discharge. Likely will need to wait until Monday 10/11/21 for PASSAR #. Pt is aware. Her desire is to go to SNF at discharge. ? ?Hyponatremia ?Mild, at 134 from 133 prior.  ?-BMP prn ? ?Normocytic Anemia ?Thrombocytopenia ?Hb stable at 9-10. Likely post-surgical as both started after her surgery.  ?-CBC prn ?  ?Syncope and collapse ?History of previous syncopes and cardiac event monitor in the past showing rare PACs and PVCs.  Follows up with Dr. Bettina Gavia as outpatient.  Discouraged use of alcohol.  No chest pain during this hospitalization.    Will need to follow-up with cardiology as outpatient.  IV fluids discontinued. ? ?Alcohol use ?Reports drinking at least a bottle of wine and several beers daily.  Last drink night of admission prior to fall.  Continue on CIWA protocol and Ativan.  Denies any hallucinations tremors sweating or active signs of withdrawal. ? ?Diabetes mellitus type 2 without retinopathy ([place]) ?Continue sliding scale insulin at this time.  Patient takes metformin at home.  We will continue to monitor while in the hospital.   ?  ?Hypertension associated with diabetes ([place]) ? Takes Coreg, valsartan and hydralazine at home.  Today, pt orthostatic with PT. Will need to reduce home HTN meds. ? ?Hyperlipidemia associated with type 2 diabetes mellitus ([place]) ?Continue Lipitor. ?  ?Hypothyroidism ?Continue Synthroid. ?  ?Anxiety and depression ?Continue Cymbalta and trazodone.  ? ?  ? DVT prophylaxis: rivaroxaban (XARELTO) tablet 10 mg Start: 10/07/21 0800 ?SCDs Start: 10/06/21 1756 ?Place TED hose Start: 10/06/21 1756 ?rivaroxaban (XARELTO) tablet 10 mg  ? ?Code Status:   ?  Code Status: DNR ? ?Disposition: Orthopedics ?Status is: Inpatient ?Remains inpatient appropriate because: Status post hip surgery, ? ? Family Communication:  ?No family at bedside ? ?Consultants:  ?Orthopedics ? ?Procedures:  ?Left Total hip arthroplasty on 10/06/2021 by Dr. Lorin Mercy orthopedics. ? ?Antimicrobials:  ?None ? ?Anti-infectives (From admission, onward)  ? ? Start     Dose/Rate Route Frequency Ordered Stop  ? 10/06/21 1315  ceFAZolin (ANCEF) IVPB 2g/100 mL premix       ? 2 g ?200 mL/hr over 30 Minutes Intravenous  On call to O.R. 10/06/21 1259 10/06/21 1515  ? ?  ? ?Subjective: ?No interval changes, no new complaints. She wants to switch trazadone to PRN. Otherwise feels well, ready to get to SNF. Pain is well controlled.  ? ?Objective: ?Vitals:  ? 10/09/21 1404 10/09/21 2002 10/10/21 0434 10/10/21 1240  ?BP: (!) 143/58 (!) 119/54 (!) 122/54 137/75  ?Pulse: 79 80  72 74  ?Resp: '19 18 18 16  '$ ?Temp: 98.7 ?F (37.1 ?C) 98.4 ?F (36.9 ?C) 98.3 ?F (36.8 ?C) 98 ?F (36.7 ?C)  ?TempSrc: Oral Oral Oral Oral  ?SpO2: 95% 96% 95% 98%  ?Weight:      ?Height:      ? ? ?Intake/Output Summary (Last 24 hours) at 10/10/2021 1320 ?Last data filed at 10/10/2021 0845 ?Gross per 24 hour  ?Intake 360 ml  ?Output 1700 ml  ?Net -1340 ml  ? ?Filed Weights  ? 10/05/21 2141  ?Weight: 73.1 kg  ? ? ?Physical Examination: ?Physical Exam ?Vitals and nursing note reviewed.  ?Constitutional:   ?   General: She is not in acute distress. ?   Appearance: Normal appearance. She is normal weight. She is not ill-appearing, toxic-appearing or diaphoretic.  ?HENT:  ?   Head: Normocephalic and atraumatic.  ?   Nose: Nose normal. No rhinorrhea.  ?Eyes:  ?   General: No scleral icterus. ?Cardiovascular:  ?   Rate and Rhythm: Normal rate and regular rhythm.  ?   Pulses: Normal pulses.  ?Pulmonary:  ?   Effort: Pulmonary effort is normal. No respiratory distress.  ?   Breath sounds: Normal breath sounds. No wheezing or rales.  ?Abdominal:  ?   General: Bowel sounds are normal. There is no distension.  ?   Palpations: Abdomen is soft.  ?   Tenderness: There is no abdominal tenderness.  ?Skin: ?   General: Skin is warm and dry.  ?Neurological:  ?   General: No focal deficit present.  ?   Mental Status: She is alert and oriented to person, place, and time.  ? ? ?Data Reviewed:  ? ?CBC: ?Recent Labs  ?Lab 10/06/21 ?7494 10/07/21 ?4967 10/08/21 ?5916 10/10/21 ?3846  ?WBC 8.8 4.9 5.7 4.8  ?HGB 13.1 10.6* 10.2* 9.2*  ?HCT 39.0 31.8* 30.7* 27.2*  ?MCV 97.7 98.8 97.5 98.9  ?PLT 165 122* 114* 134*  ? ? ?Basic Metabolic Panel: ?Recent Labs  ?Lab 10/05/21 ?1148 10/06/21 ?6599 10/07/21 ?3570 10/08/21 ?1779 10/10/21 ?3903  ?NA 137 133* 136 133* 134*  ?K 5.8* 4.2 4.4 4.2 4.7  ?CL 101 100 104 97* 97*  ?CO2 28 21* '25 27 31  '$ ?GLUCOSE 133* 228* 163* 204* 208*  ?BUN '21 19 14 13 18  '$ ?CREATININE 1.12 0.65 0.92 0.74 0.74  ?CALCIUM 9.6 9.1 8.6* 8.9  9.1  ?MG  --   --   --  2.0  --   ? ? ?Liver Function Tests: ?No results for input(s): AST, ALT, ALKPHOS, BILITOT, PROT, ALBUMIN in the last 168 hours. ? ? ?Radiology Studies: ?DG HIP PORT UNILAT WITH PELVIS 1V LEFT ? ?Result Date: 10/08/2021 ?CLINICAL DATA:  Left hip pain.  Recent arthroplasty. EXAM: DG HIP (WITH OR WITHOUT PELVIS) 1V PORT LEFT COMPARISON:  Left hip x-rays dated Oct 06, 2021. FINDINGS: Left hip total arthroplasty. No evidence of hardware failure or loosening. No acute fracture or dislocation. Unchanged mild right hip osteoarthritis. Decreasing subcutaneous emphysema overlying the lateral left hip. IMPRESSION: 1. Left hip total arthroplasty without hardware complication. Electronically Signed  By: Titus Dubin M.D.   On: 10/08/2021 16:45   ? ? ? LOS: 4 days  ? ? ?Lorelei Pont, MD PhD FACP ?[facility] ?Available via Epic secure chat 7am-7pm ?After these hours, please refer to coverage provider listed on [email or web address] ?10/10/2021, 1:20 PM  ? ?

## 2021-10-11 DIAGNOSIS — F419 Anxiety disorder, unspecified: Secondary | ICD-10-CM | POA: Diagnosis not present

## 2021-10-11 DIAGNOSIS — S72002A Fracture of unspecified part of neck of left femur, initial encounter for closed fracture: Secondary | ICD-10-CM | POA: Diagnosis not present

## 2021-10-11 DIAGNOSIS — E119 Type 2 diabetes mellitus without complications: Secondary | ICD-10-CM | POA: Diagnosis not present

## 2021-10-11 DIAGNOSIS — Z789 Other specified health status: Secondary | ICD-10-CM | POA: Diagnosis not present

## 2021-10-11 LAB — CBC
HCT: 29.6 % — ABNORMAL LOW (ref 36.0–46.0)
Hemoglobin: 9.9 g/dL — ABNORMAL LOW (ref 12.0–15.0)
MCH: 32.8 pg (ref 26.0–34.0)
MCHC: 33.4 g/dL (ref 30.0–36.0)
MCV: 98 fL (ref 80.0–100.0)
Platelets: 202 10*3/uL (ref 150–400)
RBC: 3.02 MIL/uL — ABNORMAL LOW (ref 3.87–5.11)
RDW: 12.5 % (ref 11.5–15.5)
WBC: 4.5 10*3/uL (ref 4.0–10.5)
nRBC: 0 % (ref 0.0–0.2)

## 2021-10-11 LAB — GLUCOSE, CAPILLARY
Glucose-Capillary: 193 mg/dL — ABNORMAL HIGH (ref 70–99)
Glucose-Capillary: 243 mg/dL — ABNORMAL HIGH (ref 70–99)
Glucose-Capillary: 276 mg/dL — ABNORMAL HIGH (ref 70–99)
Glucose-Capillary: 255 mg/dL — ABNORMAL HIGH (ref 70–99)

## 2021-10-11 MED ORDER — GLUCERNA SHAKE PO LIQD
237.0000 mL | Freq: Three times a day (TID) | ORAL | Status: DC
  Administered 2021-10-11 – 2021-10-12 (×2): 237 mL via ORAL
  Filled 2021-10-11 (×7): qty 237

## 2021-10-11 NOTE — Care Management Important Message (Signed)
Important Message ? ?Patient Details IM Letter given to the Patient. ?Name: Marissa Hall ?MRN: [id number] ?Date of Birth: 1940-11-07 ? ? ?Medicare Important Message Given:  Yes ? ? ? ? ?Kerin Salen ?10/11/2021, 11:54 AM ?

## 2021-10-11 NOTE — Progress Notes (Signed)
?PROGRESS NOTE ? ? ? ?Marissa Hall  GYJ:[id number] DOB: 08/24/1940 DOA: 10/05/2021 ?PCP: Tammi Sou, MD  ? ? ?Brief Narrative:  ?Marissa Hall is a 81 y.o. female with past medical history of type 2 diabetes, hypertension, hyperlipidemia, hypothyroidism, history of DVT status post 6 months of Xarelto, recurrent hyperkalemia, recurrent syncope, history of CVA, depression and anxiety and alcohol use disorder presented to hospital after sustaining a syncopal event and hurting her hip.  Has history of wearing a monitor for recurrent syncope in the past and follows up with Dr. Bettina Gavia cardiology as outpatient with mention of PACs and some PVCs.  Patient  stated that she drinks too much alcohol at home. In the ED, patient was noted to have stable vitals.  WBC was 8.8 hemoglobin of 13.1.  Creatinine 0.6.  CT head was negative for fracture.  CT pelvis showed acute fracture of the proximal left femur.  CT C-spine with multilevel degenerative changes.  Orthopedics was consulted from the ED and patient was admitted hospital for further evaluation and treatment. ? ?Assessment and Plan: ? ?Principal Problem: ?  Closed fracture of proximal end of left femur, initial encounter ([place]) ?Active Problems: ?  Syncope and collapse ?  Hyperkalemia ?  Alcohol use ?  Diabetes mellitus type 2 without retinopathy ([place]) ?  Hypertension associated with diabetes ([place]) ?  Anxiety and depression ?  Hypothyroidism ?  Hyperlipidemia associated with type 2 diabetes mellitus ([place]) ?  Malnutrition of moderate degree ?  ?* Closed fracture of proximal end of left femur, initial encounter ([place]) ?Patient was seen by orthopedics and underwent total hip arthroplasty on 10/06/2021.  on Xarelto for DVT prophylaxis.     Awaiting PASSAR # for SNF discharge.  ?  ?Syncope and collapse ?History of previous syncopes and cardiac event monitor in the past showing rare PACs and PVCs.  Follows up with Dr. Bettina Gavia as outpatient.   Will need to follow-up with  cardiology as outpatient. ? ?Alcohol use order ?Reports drinking at least a bottle of wine and several beers daily.  Last drink night of admission prior to fall.  Patient did not have any obvious signs of withdrawal during hospitalization. ? ?Diabetes mellitus type 2 without retinopathy ([place]) ?Continue sliding scale insulin Accu-Cheks diabetic diet while in the hospital.  Takes metformin at home. ?  ?Hypertension associated with diabetes ([place]) ? Takes Coreg, valsartan and hydralazine at home.  Was orthostatic with PT so home medications have been reduced. ? ?Hyperlipidemia associated with type 2 diabetes mellitus ([place]) ?Continue Lipitor. ?  ?Hypothyroidism ?Continue Synthroid. ?  ?Anxiety and depression ?Continue Cymbalta and trazodone.  ? ?  ? DVT prophylaxis: rivaroxaban (XARELTO) tablet 10 mg Start: 10/07/21 0800 ?SCDs Start: 10/06/21 1756 ?Place TED hose Start: 10/06/21 1756 ?rivaroxaban (XARELTO) tablet 10 mg  ? ?Code Status:   ?  Code Status: DNR ? ?Disposition: Skilled nursing facility. ?Status is: Inpatient ?Remains inpatient appropriate because: Awaiting for skilled nursing facility ? ? Family Communication: With the patient at bedside. ? ?Consultants:  ?Orthopedics ? ?Procedures:  ?Total left hip arthroplasty on 10/06/2021 ? ?Antimicrobials:  ?None ? ? ?Subjective: ?Today, patient was seen and examined at bedside.  Patient states that she feels okay.  Denies any nausea vomiting fever or chills. ? ?Objective: ?Vitals:  ? 10/10/21 1240 10/10/21 2040 10/11/21 0544 10/11/21 0710  ?BP: 137/75 120/64 (!) 145/50   ?Pulse: 74 78 73   ?Resp: '16 18 18   '$ ?Temp: 98 ?F (36.7 ?C) 99 ?F (37.2 ?  C) 98.1 ?F (36.7 ?C)   ?TempSrc: Oral Oral Oral   ?SpO2: 98% 98% 100% 100%  ?Weight:      ?Height:      ? ? ?Intake/Output Summary (Last 24 hours) at 10/11/2021 1106 ?Last data filed at 10/11/2021 0086 ?Gross per 24 hour  ?Intake 1073 ml  ?Output 2450 ml  ?Net -1377 ml  ? ?Filed Weights  ? 10/05/21 2141  ?Weight: 73.1 kg  ? ? ?Physical  Examination: ? ?General:  Average built, not in obvious distress ?HENT:   No scleral pallor or icterus noted. Oral mucosa is moist.  ?Chest:  Clear breath sounds.  Diminished breath sounds bilaterally. No crackles or wheezes.  ?CVS: S1 &S2 heard. No murmur.  Regular rate and rhythm. ?Abdomen: Soft, nontender, nondistended.  Bowel sounds are heard.   ?Extremities: No cyanosis, clubbing or edema.  Peripheral pulses are palpable.  Hip surgical site healthy. ?Psych: Alert, awake and communicative.  Normal mood ?CNS:  No cranial nerve deficits.  Power equal in all extremities.   ?Skin: Warm and dry.  No rashes noted. ? ?Data Reviewed:  ? ?CBC: ?Recent Labs  ?Lab 10/06/21 ?7619 10/07/21 ?5093 10/08/21 ?2671 10/10/21 ?2458  ?WBC 8.8 4.9 5.7 4.8  ?HGB 13.1 10.6* 10.2* 9.2*  ?HCT 39.0 31.8* 30.7* 27.2*  ?MCV 97.7 98.8 97.5 98.9  ?PLT 165 122* 114* 134*  ? ? ?Basic Metabolic Panel: ?Recent Labs  ?Lab 10/05/21 ?1148 10/06/21 ?0998 10/07/21 ?3382 10/08/21 ?5053 10/10/21 ?9767  ?NA 137 133* 136 133* 134*  ?K 5.8* 4.2 4.4 4.2 4.7  ?CL 101 100 104 97* 97*  ?CO2 28 21* '25 27 31  '$ ?GLUCOSE 133* 228* 163* 204* 208*  ?BUN '21 19 14 13 18  '$ ?CREATININE 1.12 0.65 0.92 0.74 0.74  ?CALCIUM 9.6 9.1 8.6* 8.9 9.1  ?MG  --   --   --  2.0  --   ? ? ?Liver Function Tests: ?No results for input(s): AST, ALT, ALKPHOS, BILITOT, PROT, ALBUMIN in the last 168 hours. ? ? ?Radiology Studies: ?No results found. ? ? ? LOS: 5 days  ? ? ?Flora Lipps, MD ?[facility] ?Available via Epic secure chat 7am-7pm ?After these hours, please refer to coverage provider listed on [email or web address] ?10/11/2021, 11:06 AM  ?  ?

## 2021-10-11 NOTE — Progress Notes (Incomplete)
?   10/11/21 1110  ?Orthostatic Lying   ?BP- Lying 109/60  ?Pulse- Lying 71  ?Orthostatic Sitting  ?BP- Sitting 104/61  ?Pulse- Sitting 73  ?Orthostatic Standing at 0 minutes  ?BP- Standing at 0 minutes 111/63  ?Pulse- Standing at 0 minutes 73  ?Orthostatic Standing at 3 minutes  ?BP- Standing at 3 minutes 136/62  ?Pulse- Standing at 3 minutes 75  ?Oxygen Therapy  ?SpO2 97 %  ?O2 Device Nasal Cannula  ?O2 Flow Rate (L/min) 2 L/min  ? ? ?

## 2021-10-11 NOTE — Progress Notes (Signed)
Physical Therapy Treatment ?Patient Details ?Name: Marissa Hall ?MRN: [id number] ?DOB: [birth year] ?Today's Date: 10/11/2021 ? ? ?History of Present Illness 81 yo female with onset of fall and resulting fracture proximal L femur was admitted 5/3 and given THA with direct anterior approach, WBAT.  PMHx:  falls, R DVT, EtOH, breast CA, Covid 19, depression DM, hypothyroidism, non ischemic cardiomyopathy, osteoporosis, HTN, ? ?  ?PT Comments  ? ? Pt reporting "I'm feeling worse than yesterday" but agreeable to be seen. Orthostatic vitals taken and were negative, see table below, though pt did report some dizziness throughout duration of treatment. Pt required min guard for bed mobility, min assist for transfers, and min guard for short-distance ambulation in room. Pt completed short HEP seated EOB; emphasized repeating HEP for second time later today. Pt on 2LO2 via [place], SpO2 monitored throughout 96-98%. We will continue to follow her acutely to promote safe discharge to the destination below.  ? ? ? 10/11/21 1110  ?Orthostatic Lying   ?BP- Lying 109/60  ?Pulse- Lying 71  ?Orthostatic Sitting  ?BP- Sitting 104/61  ?Pulse- Sitting 73  ?Orthostatic Standing at 0 minutes  ?BP- Standing at 0 minutes 111/63  ?Pulse- Standing at 0 minutes 73  ?Orthostatic Standing at 3 minutes  ?BP- Standing at 3 minutes 136/62  ?Pulse- Standing at 3 minutes 75  ?Oxygen Therapy  ?SpO2 97 %  ?O2 Device Nasal Cannula  ?O2 Flow Rate (L/min) 2 L/min  ? ?  ?Recommendations for follow up therapy are one component of a multi-disciplinary discharge planning process, led by the attending physician.  Recommendations may be updated based on patient status, additional functional criteria and insurance authorization. ? ?Follow Up Recommendations ? Skilled nursing-short term rehab (<3 hours/day) ?  ?  ?Assistance Recommended at Discharge Frequent or constant Supervision/Assistance  ?Patient can return home with the following A lot of help with walking  and/or transfers;A lot of help with bathing/dressing/bathroom;Assistance with cooking/housework;Assist for transportation;Help with stairs or ramp for entrance ?  ?Equipment Recommendations ? None recommended by PT  ?  ?Recommendations for Other Services   ? ? ?  ?Precautions / Restrictions Precautions ?Precautions: Fall ?Precaution Comments: dizzy ?Restrictions ?Weight Bearing Restrictions: No ?LLE Weight Bearing: Weight bearing as tolerated  ?  ? ?Mobility ? Bed Mobility ?Overal bed mobility: Needs Assistance ?Bed Mobility: Supine to Sit ?  ?  ?Supine to sit: Min guard ?  ?  ?General bed mobility comments: Pt min guard for safety only, no physical assist required. ?  ? ?Transfers ?Overall transfer level: Needs assistance ?Equipment used: Rolling walker (2 wheels) ?Transfers: Sit to/from Stand ?Sit to Stand: Min assist, From elevated surface ?  ?Step pivot transfers: Min assist ?  ?  ?  ?General transfer comment: Pt required min assist from elevated surface for steadying of RW, VCs for sequencing. Pt transfered via step pivot transfer to [facility], min assist for steadying. Pt able to complete standing marches for 48mn.  ?  ? ?Ambulation/Gait ?Ambulation/Gait assistance: Min guard ?Gait Distance (Feet): 3 Feet ?Assistive device: Rolling walker (2 wheels) ?Gait Pattern/deviations: Step-to pattern, Decreased stride length, Decreased weight shift to left ?Gait velocity: decreased ?  ?  ?General Gait Details: Pt ambulated BSC to recliner ~33fwith RW, min guard, no physical assist required, VCs for sequencing especially during stand to sit transfer in recliner ? ? ?Stairs ?  ?  ?  ?  ?  ? ? ?Wheelchair Mobility ?  ? ?Modified Rankin (Stroke Patients Only) ?  ? ? ?  ?  Balance Overall balance assessment: Needs assistance, History of Falls ?Sitting-balance support: Feet supported, No upper extremity supported ?Sitting balance-Leahy Scale: Fair ?  ?  ?Standing balance support: Bilateral upper extremity supported, During  functional activity ?Standing balance-Leahy Scale: Poor ?Standing balance comment: poor balance but effectively controls with RW, able to take RUE off walker during orthostatic vitals ?  ?  ?  ?  ?  ?  ?  ?  ?  ?  ?  ?  ? ?  ?Cognition Arousal/Alertness: Awake/alert ?Behavior During Therapy: [facility] for tasks assessed/performed ?Overall Cognitive Status: Within Functional Limits for tasks assessed ?  ?  ?  ?  ?  ?  ?  ?  ?  ?  ?  ?  ?  ?  ?  ?  ?General Comments: Hx multiple falls. ?  ?  ? ?  ?Exercises Total Joint Exercises ?Ankle Circles/Pumps: AROM, Both, 10 reps ?Quad Sets: AROM, Both, 10 reps ?Long Arc Quad: AROM, Both, 10 reps ? ?  ?General Comments General comments (skin integrity, edema, etc.): Orthostatic vitals taken, negative, see vitals flowsheet ?  ?  ? ?Pertinent Vitals/Pain Pain Assessment ?Pain Assessment: Faces ?Faces Pain Scale: Hurts little more ?Pain Location: L hip ?Pain Descriptors / Indicators: Grimacing, Guarding, Operative site guarding ?Pain Intervention(s): Limited activity within patient's tolerance, Monitored during session, Repositioned, Ice applied  ? ? ?Home Living   ?  ?  ?  ?  ?  ?  ?  ?  ?  ?   ?  ?Prior Function    ?  ?  ?   ? ?PT Goals (current goals can now be found in the care plan section) Acute Rehab PT Goals ?Patient Stated Goal: to get home and reduce pain ?PT Goal Formulation: With patient/family ?Time For Goal Achievement: 10/21/21 ?Potential to Achieve Goals: Good ?Progress towards PT goals: Progressing toward goals ? ?  ?Frequency ? ? ? Min 5X/week ? ? ? ?  ?PT Plan Current plan remains appropriate  ? ? ?Co-evaluation   ?  ?  ?  ?  ? ?  ?AM-PAC PT "6 Clicks" Mobility   ?Outcome Measure ? Help needed turning from your back to your side while in a flat bed without using bedrails?: A Little ?Help needed moving from lying on your back to sitting on the side of a flat bed without using bedrails?: A Little ?Help needed moving to and from a bed to a chair (including a  wheelchair)?: A Little ?Help needed standing up from a chair using your arms (e.g., wheelchair or bedside chair)?: A Lot ?Help needed to walk in hospital room?: A Lot ?Help needed climbing 3-5 steps with a railing? : Total ?6 Click Score: 14 ? ?  ?End of Session Equipment Utilized During Treatment: Gait belt;Oxygen (2L via [place], SpO2 96-98%) ?Activity Tolerance: Patient limited by fatigue;Patient limited by pain ?Patient left: with call bell/phone within reach;in chair;with chair alarm set ?Nurse Communication: Mobility status ?PT Visit Diagnosis: Unsteadiness on feet (R26.81);Muscle weakness (generalized) (M62.81);Pain ?Pain - Right/Left: Left ?Pain - part of body: Hip ?  ? ? ?Time: [id number] ?PT Time Calculation (min) (ACUTE ONLY): 32 min ? ?Charges:  $Therapeutic Exercise: 8-22 mins ?$Therapeutic Activity: 8-22 mins          ? ?Coolidge Breeze, PT, DPT ?[facility] ?Office: [phone] ?Pager: [phone] ? ?Coolidge Breeze ?10/11/2021, 11:44 AM ? ?

## 2021-10-12 DIAGNOSIS — F419 Anxiety disorder, unspecified: Secondary | ICD-10-CM | POA: Diagnosis not present

## 2021-10-12 DIAGNOSIS — Z789 Other specified health status: Secondary | ICD-10-CM | POA: Diagnosis not present

## 2021-10-12 DIAGNOSIS — E119 Type 2 diabetes mellitus without complications: Secondary | ICD-10-CM | POA: Diagnosis not present

## 2021-10-12 DIAGNOSIS — S72002A Fracture of unspecified part of neck of left femur, initial encounter for closed fracture: Secondary | ICD-10-CM | POA: Diagnosis not present

## 2021-10-12 LAB — MAGNESIUM: Magnesium: 2.4 mg/dL (ref 1.7–2.4)

## 2021-10-12 LAB — BASIC METABOLIC PANEL
Anion gap: 9 (ref 5–15)
BUN: 21 mg/dL (ref 8–23)
CO2: 30 mmol/L (ref 22–32)
Calcium: 9.3 mg/dL (ref 8.9–10.3)
Chloride: 98 mmol/L (ref 98–111)
Creatinine, Ser: 0.75 mg/dL (ref 0.44–1.00)
GFR, Estimated: >60 mL/min (ref >60)
Glucose, Bld: 196 mg/dL — ABNORMAL HIGH (ref 70–99)
Potassium: 4.7 mmol/L (ref 3.5–5.1)
Sodium: 137 mmol/L (ref 135–145)

## 2021-10-12 LAB — GLUCOSE, CAPILLARY
Glucose-Capillary: 210 mg/dL — ABNORMAL HIGH (ref 70–99)
Glucose-Capillary: 206 mg/dL — ABNORMAL HIGH (ref 70–99)
Glucose-Capillary: 183 mg/dL — ABNORMAL HIGH (ref 70–99)
Glucose-Capillary: 156 mg/dL — ABNORMAL HIGH (ref 70–99)

## 2021-10-12 MED ORDER — ACETAMINOPHEN 500 MG PO TABS
1000.0000 mg | ORAL_TABLET | Freq: Four times a day (QID) | ORAL | Status: DC | PRN
  Administered 2021-10-12 – 2021-10-13 (×3): 1000 mg via ORAL
  Filled 2021-10-12 (×3): qty 2

## 2021-10-12 NOTE — Progress Notes (Signed)
Physical Therapy Treatment ?Patient Details ?Name: Marissa Hall ?MRN: [id number] ?DOB: 1940-08-24 ?Today's Date: 10/12/2021 ? ? ?History of Present Illness 81 yo female with onset of fall and resulting fracture proximal L femur was admitted 5/3 and given THA with direct anterior approach, WBAT.  PMHx:  falls, R DVT, EtOH, breast CA, Covid 19, depression DM, hypothyroidism, non ischemic cardiomyopathy, osteoporosis, HTN, ? ?  ?PT Comments  ? ? Pt able to tolerate gait training today for first time without c/o dizziness. Pt ambulated with Min Guard 30f with RW. Pt is much more awake and alert with increased tolerance for activity. Continue to recommend short term SNF placement once medically cleared. Continue PT per POC. ?  ?Recommendations for follow up therapy are one component of a multi-disciplinary discharge planning process, led by the attending physician.  Recommendations may be updated based on patient status, additional functional criteria and insurance authorization. ? ?Follow Up Recommendations ? Skilled nursing-short term rehab (<3 hours/day) ?  ?  ?Assistance Recommended at Discharge Intermittent Supervision/Assistance  ?Patient can return home with the following A little help with walking and/or transfers;Assistance with cooking/housework;Assist for transportation ?  ?Equipment Recommendations ? None recommended by PT  ?  ?Recommendations for Other Services   ? ? ?  ?Precautions / Restrictions Precautions ?Precautions: Fall ?Restrictions ?Weight Bearing Restrictions: No ?LLE Weight Bearing: Weight bearing as tolerated  ?  ? ?Mobility ? Bed Mobility ?Overal bed mobility:  (Pt in recliner) ?  ?  ?  ?  ?  ?  ?  ?  ? ?Transfers ?Overall transfer level: Needs assistance ?Equipment used: Rolling walker (2 wheels) ?Transfers: Sit to/from Stand ?Sit to Stand: Min guard ?  ?  ?  ?  ?  ?General transfer comment: Improved ability to transfer ?  ? ?Ambulation/Gait ?Ambulation/Gait assistance: Min guard ?Gait  Distance (Feet): 80 Feet ?Assistive device: Rolling walker (2 wheels) ?Gait Pattern/deviations: Step-through pattern, Decreased stance time - left ?Gait velocity: decreased ?  ?  ?General Gait Details: No dizziness in standing ? ? ?Stairs ?  ?  ?  ?  ?  ? ? ?Wheelchair Mobility ?  ? ?Modified Rankin (Stroke Patients Only) ?  ? ? ?  ?Balance Overall balance assessment: Needs assistance, History of Falls ?Sitting-balance support: Feet supported, No upper extremity supported ?Sitting balance-Leahy Scale: Good ?  ?  ?Standing balance support: Bilateral upper extremity supported, During functional activity ?Standing balance-Leahy Scale: Fair ?Standing balance comment: Able to tolerate accepting more weight through Left side ?  ?  ?  ?  ?  ?  ?  ?  ?  ?  ?  ?  ? ?  ?Cognition Arousal/Alertness: Awake/alert ?[facility] ?Overall Cognitive Status: Within Functional Limits for tasks assessed ?  ?  ?  ?  ?  ?  ?  ?  ?  ?  ?  ?  ?  ?  ?  ?  ?General Comments: Hx multiple falls. ?  ?  ? ?  ?Exercises Total Joint Exercises ?Ankle Circles/Pumps: AROM, Both, 15 reps ?Gluteal Sets: AROM, Both, 10 reps ?Long Arc Quad: AROM, Both, 10 reps ?Knee Flexion: Both, 10 reps ? ?  ?General Comments General comments (skin integrity, edema, etc.): Pt educated on benefits of short term SNF stay prior to returning home ?  ?  ? ?Pertinent Vitals/Pain Pain Assessment ?Pain Assessment: 0-10 ?Pain Score: 3  ?Pain Location: L hip ?Pain Descriptors / Indicators: Discomfort ?Pain Intervention(s): Monitored  during session, Ice applied  ? ? ?Home Living   ?  ?  ?  ?  ?  ?  ?  ?  ?  ?   ?  ?Prior Function    ?  ?  ?   ? ?PT Goals (current goals can now be found in the care plan section) Acute Rehab PT Goals ?Patient Stated Goal: to get home and reduce pain ?Progress towards PT goals: Progressing toward goals ? ?  ?Frequency ? ? ? Min 5X/week ? ? ? ?  ?PT Plan Current plan remains appropriate  ? ? ?Co-evaluation    ?  ?  ?  ?  ? ?  ?AM-PAC PT "6 Clicks" Mobility   ?Outcome Measure ? Help needed turning from your back to your side while in a flat bed without using bedrails?: A Little ?Help needed moving from lying on your back to sitting on the side of a flat bed without using bedrails?: A Little ?Help needed moving to and from a bed to a chair (including a wheelchair)?: A Little ?Help needed standing up from a chair using your arms (e.g., wheelchair or bedside chair)?: A Little ?Help needed to walk in hospital room?: A Little ?Help needed climbing 3-5 steps with a railing? : A Lot ?6 Click Score: 17 ? ?  ?End of Session Equipment Utilized During Treatment: Gait belt ?Activity Tolerance: Patient tolerated treatment well ?Patient left: in chair;with call bell/phone within reach;with chair alarm set ?Nurse Communication: Mobility status ?PT Visit Diagnosis: Unsteadiness on feet (R26.81);Muscle weakness (generalized) (M62.81);Pain ?Pain - Right/Left: Left ?Pain - part of body: Hip ?  ? ? ?Time: 7628-3151 ?PT Time Calculation (min) (ACUTE ONLY): 25 min ? ?Charges:  $Gait Training: 8-22 mins ?$Therapeutic Exercise: 8-22 mins          ?[name], PTA ? ?[name] ?10/12/2021, 3:35 PM ? ?

## 2021-10-12 NOTE — TOC Progression Note (Signed)
Transition of Care (TOC) - Progression Note  ? ? ?Patient Details  ?Name: Marissa Hall ?MRN: [id number] ?Date of Birth: 1941/03/15 ? ?Transition of Care (TOC) CM/SW Contact  ?Ross Ludwig, LCSW ?Phone Number: ?10/12/2021, 5:31 PM ? ?Clinical Narrative:    ? ?CSW contacted [facility], patient's Passar number is still pending.  CSW updated [facility], patient, and her family.  CSW to continue to follow patient's progress throughout discharge planning. ? ?Expected Discharge Plan: [place] ?Barriers to Discharge: Continued Medical Work up ? ?Expected Discharge Plan and Services ?Expected Discharge Plan: [place] ?  ?  ?  ?Living arrangements for the past 2 months: [place] ?                ?  ?  ?  ?  ?  ?  ?  ?  ?  ?  ? ? ?Social Determinants of Health (SDOH) Interventions ?  ? ?Readmission Risk Interventions ?   ? View : No data to display.  ?  ?  ?  ? ? ?

## 2021-10-12 NOTE — Progress Notes (Signed)
?PROGRESS NOTE ? ? ? ?Marissa Hall  ZOX:[id number] DOB: 02-21-1941 DOA: 10/05/2021 ?PCP: Tammi Sou, MD  ? ? ?Brief Narrative:  ?Man Marissa Hall is a 81 y.o. female with past medical history of type 2 diabetes, hypertension, hyperlipidemia, hypothyroidism, history of DVT status post 6 months of Xarelto, recurrent hyperkalemia, recurrent syncope, history of CVA, depression and anxiety and alcohol use disorder presented to hospital after sustaining a syncopal event and hurting her hip.  Has history of wearing a monitor for recurrent syncope in the past and follows up with Dr. Bettina Gavia cardiology as outpatient with mention of PACs and some PVCs.  Patient  stated that she drinks too much alcohol at home. In the ED, patient was noted to have stable vitals.  WBC was 8.8 hemoglobin of 13.1.  Creatinine 0.6.  CT head was negative for fracture.  CT pelvis showed acute fracture of the proximal left femur.  CT C-spine with multilevel degenerative changes.  Orthopedics was consulted from the ED and patient was admitted hospital for further evaluation and treatment. ? ?Assessment and Plan: ? ?Principal Problem: ?  Closed fracture of proximal end of left femur, initial encounter ([place]) ?Active Problems: ?  Syncope and collapse ?  Hyperkalemia ?  Alcohol use ?  Diabetes mellitus type 2 without retinopathy (Marissa Hall) ?  Hypertension associated with diabetes ([place]) ?  Anxiety and depression ?  Hypothyroidism ?  Hyperlipidemia associated with type 2 diabetes mellitus ([place]) ?  Malnutrition of moderate degree ?  ?* Closed fracture of proximal end of left femur, initial encounter ([place]) ?Patient was seen by orthopedics and underwent total hip arthroplasty on 10/06/2021.  on Xarelto for DVT prophylaxis.     Awaiting PASSAR # for SNF discharge.  ?  ?Syncope and collapse ?History of previous syncopes and cardiac event monitor in the past showing rare PACs and PVCs.  Follows up with Dr. Bettina Gavia as outpatient.   Will need to follow-up with  cardiology as outpatient. ? ?Alcohol use order ?Reports drinking at least a bottle of wine and several beers daily.  Last drink night of admission prior to fall.  Patient did not have any obvious signs of withdrawal during hospitalization. ? ?Diabetes mellitus type 2 without retinopathy (Marissa Hall) ?Continue sliding scale insulin Accu-Cheks diabetic diet while in the hospital.  Takes metformin at home.  Latest POC glucose of 210. ?  ?Hypertension associated with diabetes ([place]) ? Takes Coreg, valsartan and hydralazine at home.  Was orthostatic with PT so home medications have been reduced.  Continue to monitor blood pressure ? ?Hyperlipidemia associated with type 2 diabetes mellitus ([place]) ?Continue Lipitor. ?  ?Hypothyroidism ?Continue Synthroid. ?  ?Anxiety and depression ?Continue Cymbalta and trazodone.  ?  ? DVT prophylaxis: rivaroxaban (XARELTO) tablet 10 mg Start: 10/07/21 0800 ?SCDs Start: 10/06/21 1756 ?Place TED hose Start: 10/06/21 1756 ?rivaroxaban (XARELTO) tablet 10 mg  ? ?Code Status:   ?  Code Status: DNR ? ?Disposition:  ?Skilled nursing facility pending PASSAR, medically stable for disposition. ? ?Status is: Inpatient ? ?Remains inpatient appropriate because: Awaiting for skilled nursing facility,  ? ? Family Communication:  ?I spoke with the patient's son and updated him about the clinical condition of the patient. ? ?Consultants:  ?Orthopedics ? ?Procedures:  ?Total left hip arthroplasty on 10/06/2021 ? ?Antimicrobials:  ?None ? ? ?Subjective: ?Today, patient was seen and examined at bedside.  Patient wishes Tylenol instead of any narcotics for pain relief.  Denies any nausea vomiting fever or chills.  Has not had  a bowel movement today. ? ?Objective: ?Vitals:  ? 10/11/21 1110 10/11/21 1737 10/11/21 1955 10/12/21 0429  ?BP:  (!) 154/66 (!) 150/68 (!) 143/69  ?Pulse:  70 74 76  ?Resp:   16 18  ?Temp:  98.8 ?F (37.1 ?C) 97.8 ?F (36.6 ?C) 97.8 ?F (36.6 ?C)  ?TempSrc:  Oral Oral Oral  ?SpO2: 97% 97% 94% 95%   ?Weight:      ?Height:      ? ?No intake or output data in the 24 hours ending 10/12/21 1006 ? ?Filed Weights  ? 10/05/21 2141  ?Weight: 73.1 kg  ? ? ?Physical Examination: ?Body mass index is 24.51 kg/m?.  ?General:  Average built, not in obvious distress ?HENT:   No scleral pallor or icterus noted. Oral mucosa is moist.  ?Chest:  Clear breath sounds.  Diminished breath sounds bilaterally. No crackles or wheezes.  ?CVS: S1 &S2 heard. No murmur.  Regular rate and rhythm. ?Abdomen: Soft, nontender, nondistended.  Bowel sounds are heard.   ?Extremities: No cyanosis, clubbing or edema.  Peripheral pulses are palpable.  Left hip surgical site healthy with mild edema/dressing ?Psych: Alert, awake and oriented, normal mood ?CNS:  No cranial nerve deficits.  Power equal in all extremities.   ?Skin: Warm and dry.  Left hip surgical site with mild edema with dressing. ? ? ?Data Reviewed:  ? ?CBC: ?Recent Labs  ?Lab 10/06/21 ?4081 10/07/21 ?4481 10/08/21 ?8563 10/10/21 ?1497 10/11/21 ?1134  ?WBC 8.8 4.9 5.7 4.8 4.5  ?HGB 13.1 10.6* 10.2* 9.2* 9.9*  ?HCT 39.0 31.8* 30.7* 27.2* 29.6*  ?MCV 97.7 98.8 97.5 98.9 98.0  ?PLT 165 122* 114* 134* 202  ? ? ? ?Basic Metabolic Panel: ?Recent Labs  ?Lab 10/06/21 ?0263 10/07/21 ?7858 10/08/21 ?8502 10/10/21 ?7741 10/12/21 ?0501  ?NA 133* 136 133* 134* 137  ?K 4.2 4.4 4.2 4.7 4.7  ?CL 100 104 97* 97* 98  ?CO2 21* '25 27 31 30  '$ ?GLUCOSE 228* 163* 204* 208* 196*  ?BUN '19 14 13 18 21  '$ ?CREATININE 0.65 0.92 0.74 0.74 0.75  ?CALCIUM 9.1 8.6* 8.9 9.1 9.3  ?MG  --   --  2.0  --  2.4  ? ? ? ?Liver Function Tests: ?No results for input(s): AST, ALT, ALKPHOS, BILITOT, PROT, ALBUMIN in the last 168 hours. ? ? ?Radiology Studies: ?No results found. ? ? ? LOS: 6 days  ? ? ?Flora Lipps, MD ?[facility] ?Available via Epic secure chat 7am-7pm ?After these hours, please refer to coverage provider listed on [email or web address] ?10/12/2021, 10:06 AM  ?  ?

## 2021-10-12 NOTE — Progress Notes (Signed)
Inpatient Diabetes Program Recommendations ? ?AACE/ADA: New Consensus Statement on Inpatient Glycemic Control (2015) ? ?Target Ranges:  Prepandial:   less than 140 mg/dL ?     Peak postprandial:   less than 180 mg/dL (1-2 hours) ?     Critically ill patients:  140 - 180 mg/dL  ? ?Lab Results  ?Component Value Date  ? GLUCAP 210 (H) 10/12/2021  ? HGBA1C 7.1 (A) 07/20/2021  ? ? ?Review of Glycemic Control ? Latest Reference Range & Units 10/11/21 07:49 10/11/21 12:08 10/11/21 17:33 10/11/21 19:56 10/12/21 07:46  ?Glucose-Capillary 70 - 99 mg/dL 193 (H) 243 (H) 276 (H) 255 (H) 210 (H)  ?(H): Data is abnormally high ? ?Diabetes history: DM2 ?Outpatient Diabetes medications: Metfromin 1000 mg QD ?Current orders for Inpatient glycemic control: Novolog 0-9 units TID and 0-5 QHS ? ?Inpatient Diabetes Program Recommendations:   ? ?Novolog 2 units TID with meals if consumes at least 50% ? ?Will continue to follow while inpatient. ? ?Thank you, ?Reche Dixon, MSN, RN, CDCES ?Diabetes Coordinator ?Inpatient Diabetes Program ?[phone] (team pager from 8a-5p) ? ? ? ?

## 2021-10-13 LAB — GLUCOSE, CAPILLARY
Glucose-Capillary: 191 mg/dL — ABNORMAL HIGH (ref 70–99)
Glucose-Capillary: 254 mg/dL — ABNORMAL HIGH (ref 70–99)

## 2021-10-13 MED ORDER — MENTHOL 3 MG MT LOZG
1.0000 | LOZENGE | OROMUCOSAL | Status: DC | PRN
Start: 2021-10-13 — End: 2021-10-29

## 2021-10-13 MED ORDER — ACETAMINOPHEN 500 MG PO TABS: 500.0000 mg | ORAL_TABLET | Freq: Four times a day (QID) | ORAL | 0 refills | Status: AC | PRN

## 2021-10-13 MED ORDER — POLYETHYLENE GLYCOL 3350 17 G PO PACK: 17.0000 g | PACK | Freq: Every day | ORAL | 0 refills | Status: DC | PRN

## 2021-10-13 MED ORDER — DOCUSATE SODIUM 100 MG PO CAPS: 100.0000 mg | ORAL_CAPSULE | Freq: Two times a day (BID) | ORAL | Status: DC | PRN

## 2021-10-13 MED ORDER — ENSURE MAX PROTEIN PO LIQD
11.0000 [oz_av] | Freq: Two times a day (BID) | ORAL | Status: DC
  Filled 2021-10-13: qty 330

## 2021-10-13 MED ORDER — ACETAMINOPHEN 500 MG PO TABS: 1000.0000 mg | ORAL_TABLET | Freq: Four times a day (QID) | ORAL | 0 refills | Status: DC | PRN

## 2021-10-13 MED ORDER — FOLIC ACID 1 MG PO TABS: 1.0000 mg | ORAL_TABLET | Freq: Every day | ORAL | Status: AC

## 2021-10-13 MED ORDER — CARVEDILOL 3.125 MG PO TABS: 3.1250 mg | ORAL_TABLET | Freq: Two times a day (BID) | ORAL | Status: DC

## 2021-10-13 MED ORDER — ONDANSETRON HCL 4 MG PO TABS
4.0000 mg | ORAL_TABLET | Freq: Four times a day (QID) | ORAL | 0 refills | Status: DC | PRN
Start: 2021-10-13 — End: 2021-10-29

## 2021-10-13 MED ORDER — ENSURE MAX PROTEIN PO LIQD: 11.0000 [oz_av] | Freq: Two times a day (BID) | ORAL | Status: DC

## 2021-10-13 MED ORDER — THIAMINE HCL 100 MG PO TABS: 100.0000 mg | ORAL_TABLET | Freq: Every day | ORAL | Status: AC

## 2021-10-13 MED ORDER — VALSARTAN 160 MG PO TABS
160.0000 mg | ORAL_TABLET | Freq: Every day | ORAL | Status: DC
Start: 2021-10-13 — End: 2021-11-03

## 2021-10-13 MED ORDER — ADULT MULTIVITAMIN W/MINERALS CH: 1.0000 | ORAL_TABLET | Freq: Every day | ORAL | Status: AC

## 2021-10-13 NOTE — Progress Notes (Signed)
Patient ID: Marissa Hall, female   DOB: 12/22/1940, 81 y.o.   MRN: [id number] ? ? ?Subjective: ?7 Days Post-Op Procedure(s) (LRB): ?TOTAL HIP ARTHROPLASTY ANTERIOR APPROACH (Left) ?Patient reports pain as mild.   ? ?Objective: ?Vital signs in last 24 hours: ?Temp:  [97.6 ?F (36.4 ?C)-97.9 ?F (36.6 ?C)] 97.9 ?F (36.6 ?C) (05/10 0431) ?Pulse Rate:  [65-71] 66 (05/10 0431) ?Resp:  [16-18] 16 (05/10 0431) ?BP: (118-138)/(66-97) 138/66 (05/10 0431) ?SpO2:  [95 %-97 %] 95 % (05/10 0431) ? ?Intake/Output from previous day: ?05/09 0701 - 05/10 0700 ?In: 944 [P.O.:944] ?Out: -  ?Intake/Output this shift: ?No intake/output data recorded. ? ?Recent Labs  ?  10/11/21 ?1134  ?HGB 9.9*  ? ?Recent Labs  ?  10/11/21 ?1134  ?WBC 4.5  ?RBC 3.02*  ?HCT 29.6*  ?PLT 202  ? ?Recent Labs  ?  10/12/21 ?0501  ?NA 137  ?K 4.7  ?CL 98  ?CO2 30  ?BUN 21  ?CREATININE 0.75  ?GLUCOSE 196*  ?CALCIUM 9.3  ? ?No results for input(s): LABPT, INR in the last 72 hours. ? ?Neurologically intact ?No results found. ? ?Assessment/Plan: ?7 Days Post-Op Procedure(s) (LRB): ?TOTAL HIP ARTHROPLASTY ANTERIOR APPROACH (Left) ?Up with therapy ?Discharge to SNF ? ?[name] ?10/13/2021, 8:55 AM ? ? ? ? ? ?

## 2021-10-13 NOTE — Progress Notes (Signed)
Nutrition Follow-up ? ?DOCUMENTATION CODES:  ? ?Non-severe (moderate) malnutrition in context of chronic illness ? ?INTERVENTION:  ? ?-D/c Glucerna ? ?-Ensure MAX Protein po BID, each supplement provides 150 kcal and 30 grams of protein -chocolate flavor ? ?-Multivitamin with minerals daily ? ?-Recommend new weight for admission ? ?NUTRITION DIAGNOSIS:  ? ?Moderate Malnutrition related to chronic illness (alcohol abuse) as evidenced by mild fat depletion, mild muscle depletion. ? ?Ongoing. ? ?GOAL:  ? ?Patient will meet greater than or equal to 90% of their needs ? ?Progressing. ? ?MONITOR:  ? ?PO intake, Supplement acceptance, Labs, Weight trends, I & O's ? ?ASSESSMENT:  ? ?81 year old female with type 2 diabetes hypertension history of alcohol consumption with occasional falls asleep in a restaurant and fell in the parking lot does not think she tripped over anything. ? ?Patient currently consuming 0-85% of meals. Pt's supplement was switched to Glucerna shakes (CBGs have not improved). Will trial Ensure Max (which comes in chocolate flavor).  ? ?Admission weight: 161 lbs. ?Needs updated weight. ? ?Medications: Vitamin D, Colace, Folic acid, Multivitamin with minerals daily, Thiamine ? ?Labs reviewed: ?CBGs: 081-448 ? ?Diet Order:   ?Diet Order   ? ?       ?  Diet Carb Modified Fluid consistency: Thin; Room service appropriate? Yes  Diet effective now       ?  ? ?  ?  ? ?  ? ? ?EDUCATION NEEDS:  ? ?Education needs have been addressed ? ?Skin:  Skin Assessment: Skin Integrity Issues: ?Skin Integrity Issues:: Incisions ?Incisions: 5/3 left hip ? ?Last BM:  5/9 -type 5 ? ?Height:  ? ?Ht Readings from Last 1 Encounters:  ?10/05/21 '5\' 8"'$  (1.727 m)  ? ? ?Weight:  ? ?Wt Readings from Last 1 Encounters:  ?10/05/21 73.1 kg  ? ? ?BMI:  Body mass index is 24.51 kg/m?. ? ?Estimated Nutritional Needs:  ? ?Kcal:  1900-2100 ? ?Protein:  85-100g ? ?Fluid:  2L/day ? ? ?Clayton Bibles, MS, RD, LDN ?Inpatient Clinical  Dietitian ?Contact information available via Amion ? ?

## 2021-10-13 NOTE — Discharge Summary (Signed)
?Physician Discharge Summary ?  ?Patient: Marissa Hall MRN: [id number] DOB: Feb 15, 1941  ?Admit date:     10/05/2021  ?Discharge date: 10/13/21  ?Discharge Physician: Corrie Mckusick Donda Friedli  ? ?PCP: Tammi Sou, MD  ? ?Recommendations at discharge:  ? ?Follow-up with primary care provider at the skilled nursing facility in 3 to 5 days. ?Follow-up with orthopedics Dr. Lorin Mercy for postoperative visit in 1 week. ?Patient to keep the appointment with cardiology as outpatient for her history of syncope. ? ?Discharge Diagnoses: ?Principal Problem: ?  Closed fracture of proximal end of left femur, initial encounter ([place]) ?Active Problems: ?  Syncope and collapse ?  Hyperkalemia ?  Alcohol use ?  Diabetes mellitus type 2 without retinopathy ([place]) ?  Hypertension associated with diabetes ([place]) ?  Anxiety and depression ?  Hypothyroidism ?  Hyperlipidemia associated with type 2 diabetes mellitus ([place]) ?  Malnutrition of moderate degree ? ?Resolved Problems: ?  * No resolved hospital problems. * ? ?Hospital Course: ?Marissa Hall is a 81 y.o. female with past medical history of type 2 diabetes, hypertension, hyperlipidemia, hypothyroidism, history of DVT status post 6 months of Xarelto, recurrent hyperkalemia, recurrent syncope, history of CVA, depression and anxiety and alcohol use disorder presented to hospital after sustaining a syncopal event and hurting her hip.  Has history of wearing a monitor for recurrent syncope in the past and follows up with Dr. Bettina Gavia cardiology as outpatient with mention of PACs and some PVCs.  Patient  stated that she drinks too much alcohol at home. In the ED, patient was noted to have stable vitals.  WBC was 8.8 hemoglobin of 13.1.  Creatinine 0.6.  CT head was negative for fracture.  CT pelvis showed acute fracture of the proximal left femur.  CT C-spine with multilevel degenerative changes.  Orthopedics was consulted from the ED and patient was admitted hospital for further evaluation and  treatment. ? ?Assessment and Plan: ? ?Principal Problem: ?  Closed fracture of proximal end of left femur, initial encounter ([place]) ?Active Problems: ?  Syncope and collapse ?  Hyperkalemia ?  Alcohol use ?  Diabetes mellitus type 2 without retinopathy ([place]) ?  Hypertension associated with diabetes ([place]) ?  Anxiety and depression ?  Hypothyroidism ?  Hyperlipidemia associated with type 2 diabetes mellitus ([place]) ?  Malnutrition of moderate degree ?  ?Closed fracture of proximal end of left femur, initial encounter ([place]) ?Patient was seen by orthopedics and underwent total hip arthroplasty on 10/06/2021.  On Xarelto for DVT prophylaxis.    Patient to follow-up with Dr. Lorin Mercy orthopedics in 2 weeks postop ? ?Syncope and collapse ?History of previous syncopes and cardiac event monitor in the past showing rare PACs and PVCs.  Follows up with Dr. Bettina Gavia as outpatient.   Will need to follow-up with cardiology as outpatient. ? ?Alcohol use order ?Reports drinking at least a bottle of wine and several beers daily.  Last drink night of admission prior to fall.  Patient did not have any obvious signs of withdrawal during hospitalization.  Will prescribe thiamine folic acid and multivitamin on discharge. ? ?Diabetes mellitus type 2 without retinopathy ([place]) ?Patient received sliding scale insulin, Accu-Cheks diabetic diet while in the hospital.  Takes metformin at home.  Latest POC glucose of 191.  Will resume metformin after discharge. ?  ?Hypertension  ? Takes Coreg, valsartan and hydralazine at home.  Was orthostatic with PT so home medications have been reduced.  Dose of Coreg has been decreased to half including  valsartan doses.  Hydralazine has been discontinued.  Please readjust medication as necessary as outpatient  ? ?Hyperlipidemia associated with type 2 diabetes mellitus ([place]) ?Continue Lipitor from home.. ?  ?Hypothyroidism ?Continue Synthroid. ?  ?Anxiety and depression ?Continue Cymbalta and trazodone. ? ?Moderate  protein calorie malnutrition.  Present on admission.  Seen by dietitian during hospitalization.  Continue nutritional supplements on discharge. ? ?Consultants: Orthopedics ? ?Procedures performed: Left total hip arthroplasty on 10/06/2021  ? ?Disposition: Skilled nursing facility ? ?Diet recommendation:  ?Discharge Diet Orders (From admission, onward)  ? ?  Start     Ordered  ? 10/13/21 0000  Diet Carb Modified       ? 10/13/21 1143  ? ?  ?  ? ?  ? ?Carb modified diet ?DISCHARGE MEDICATION: ?Allergies as of 10/13/2021   ? ?   Reactions  ? Other Other (See Comments)  ? STEROIDS- emotional  ? Prednisone Other (See Comments)  ? Other reaction(s): Mental Status Changes (intolerance)  ? Amlodipine Other (See Comments)  ? Elevated creatinine  ? Hydrochlorothiazide Other (See Comments)  ? Elevated serum creatinine  ? Penicillins Other (See Comments)  ? Unsure of reaction, was 81 years old  ? ?  ? ?  ?Medication List  ?  ? ?STOP taking these medications   ? ?diclofenac Sodium 1 % Gel ?Commonly known as: VOLTAREN ?  ?hydrALAZINE 25 MG tablet ?Commonly known as: APRESOLINE ?  ? ?  ? ?TAKE these medications   ? ?acetaminophen 500 MG tablet ?Commonly known as: TYLENOL ?Take 1 tablet (500 mg total) by mouth every 6 (six) hours as needed for up to 10 days for mild pain or moderate pain (pain score 1-3 or temp > 100.5). ?  ?atorvastatin 80 MG tablet ?Commonly known as: LIPITOR ?Take 1 tablet (80 mg total) by mouth every evening. ?  ?carvedilol 3.125 MG tablet ?Commonly known as: COREG ?Take 1 tablet (3.125 mg total) by mouth 2 (two) times daily with a meal. ?What changed:  ?medication strength ?how much to take ?  ?cholecalciferol 25 MCG (1000 UNIT) tablet ?Commonly known as: VITAMIN D3 ?Take 1,000 Units by mouth daily. ?  ?docusate sodium 100 MG capsule ?Commonly known as: COLACE ?Take 1 capsule (100 mg total) by mouth 2 (two) times daily as needed for mild constipation or moderate constipation. ?  ?DULoxetine 60 MG capsule ?Commonly  known as: Cymbalta ?Take 1 capsule (60 mg total) by mouth daily. ?  ?Ensure Max Protein Liqd ?Take 330 mLs (11 oz total) by mouth 2 (two) times daily. ?  ?folic acid 1 MG tablet ?Commonly known as: FOLVITE ?Take 1 tablet (1 mg total) by mouth daily. ?Start taking on: Oct 14, 2021 ?  ?menthol-cetylpyridinium 3 MG lozenge ?Commonly known as: CEPACOL ?Take 1 lozenge (3 mg total) by mouth as needed for sore throat. ?  ?metFORMIN 500 MG 24 hr tablet ?Commonly known as: GLUCOPHAGE-XR ?Take 2 tablets (1,000 mg total) by mouth daily before supper. ?  ?multivitamin with minerals Tabs tablet ?Take 1 tablet by mouth daily. ?Start taking on: Oct 14, 2021 ?  ?ondansetron 4 MG tablet ?Commonly known as: ZOFRAN ?Take 1 tablet (4 mg total) by mouth every 6 (six) hours as needed for nausea. ?  ?OVER THE COUNTER MEDICATION ?Take 400 mg by mouth daily. Palmitoylethanolamide - suggested by Oncologist, study drug for neuropathy ?  ?oxyCODONE-acetaminophen 5-325 MG tablet ?Commonly known as: Percocet ?Take 1 tablet by mouth every 4 (four) hours as needed for severe  pain. ?  ?polyethylene glycol 17 g packet ?Commonly known as: MIRALAX / GLYCOLAX ?Take 17 g by mouth daily as needed for severe constipation or moderate constipation. ?  ?rivaroxaban 10 MG Tabs tablet ?Commonly known as: XARELTO ?Take 1 tablet (10 mg total) by mouth daily. MUST TAKE AT LEAST 4 WEEKS POSTOP FOR DVT PROPHYLAXIS ?  ?Synthroid 125 MCG tablet ?Generic drug: levothyroxine ?Take 1 tablet (125 mcg total) by mouth daily before breakfast. ?  ?SYSTANE BALANCE OP ?Place 1 drop into both eyes daily as needed (for dry eyes). ?  ?thiamine 100 MG tablet ?Take 1 tablet (100 mg total) by mouth daily. ?Start taking on: Oct 14, 2021 ?  ?traZODone 50 MG tablet ?Commonly known as: DESYREL ?Take 1 tablet (50 mg total) by mouth at bedtime. ?  ?valsartan 160 MG tablet ?Commonly known as: DIOVAN ?Take 1 tablet (160 mg total) by mouth daily. ?What changed:  ?medication strength ?how  much to take ?  ? ?  ? ?  ?  ? ? ?  ?Discharge Care Instructions  ?(From admission, onward)  ?  ? ? ?  ? ?  Start     Ordered  ? 10/13/21 0000  Discharge wound care:       ?Comments: Reinforce hip dressing

## 2021-10-13 NOTE — Progress Notes (Signed)
Physical Therapy Treatment ?Patient Details ?Name: Marissa Hall ?MRN: [id number] ?DOB: 1941-04-07 ?Today's Date: 10/13/2021 ? ? ?History of Present Illness 81 yo female with onset of fall and resulting fracture proximal L femur was admitted 5/3 and given THA with direct anterior approach, WBAT.  PMHx:  falls, R DVT, EtOH, breast CA, Covid 19, depression DM, hypothyroidism, non ischemic cardiomyopathy, osteoporosis, HTN, ? ?  ?PT Comments  ? ? Pt seen this am, she is making functional progress with every session. Pain level has decreased to 4/10 with Tylenol. Gait distance with RW increased to 171f with [employer] No dizziness with positional changes. Pt will benefit from short term stay at SNF to increase functional independence prior to returning home alone. ?  ?Recommendations for follow up therapy are one component of a multi-disciplinary discharge planning process, led by the attending physician.  Recommendations may be updated based on patient status, additional functional criteria and insurance authorization. ? ?Follow Up Recommendations ? Skilled nursing-short term rehab (<3 hours/day) ?  ?  ?Assistance Recommended at Discharge Intermittent Supervision/Assistance  ?Patient can return home with the following A little help with walking and/or transfers;Assistance with cooking/housework;Assist for transportation ?  ?Equipment Recommendations ? None recommended by PT  ?  ?Recommendations for Other Services   ? ? ?  ?Precautions / Restrictions Precautions ?Precautions: Fall ?Restrictions ?Weight Bearing Restrictions: Yes ?LLE Weight Bearing: Weight bearing as tolerated  ?  ? ?Mobility ? Bed Mobility ?Overal bed mobility: Needs Assistance ?Bed Mobility: Supine to Sit ?  ?  ?Supine to sit: Min guard ?Sit to supine: Min assist ?  ?General bed mobility comments: Pt min guard for safety only, no physical assist required. ?  ? ?Transfers ?Overall transfer level: Needs assistance ?Equipment used: Rolling walker (2  wheels) ?Transfers: Sit to/from Stand ?Sit to Stand: Supervision, Min guard ?  ?  ?  ?  ?  ?General transfer comment: Improved ability to transfer ?  ? ?Ambulation/Gait ?Ambulation/Gait assistance: Min guard ?Gait Distance (Feet): 150 Feet ?Assistive device: Rolling walker (2 wheels) ?Gait Pattern/deviations: Step-through pattern, Decreased stance time - left ?Gait velocity: decreased ?  ?  ?General Gait Details: No dizziness in standing ? ? ?Stairs ?  ?  ?  ?  ?  ? ? ?Wheelchair Mobility ?  ? ?Modified Rankin (Stroke Patients Only) ?  ? ? ?  ?Balance Overall balance assessment: Needs assistance, History of Falls ?Sitting-balance support: Feet supported, No upper extremity supported ?Sitting balance-Leahy Scale: Good ?  ?  ?Standing balance support: Bilateral upper extremity supported, During functional activity ?Standing balance-Leahy Scale: Fair ?Standing balance comment: Able to tolerate accepting more weight through Left side ?  ?  ?  ?  ?  ?  ?  ?  ?  ?  ?  ?  ? ?  ?Cognition Arousal/Alertness: Awake/alert ?Behavior During Therapy: [facility] tasks assessed/performed ?Overall Cognitive Status: Within Functional Limits for tasks assessed ?  ?  ?  ?  ?  ?  ?  ?  ?  ?  ?  ?  ?  ?  ?  ?  ?General Comments: Hx multiple falls. ?  ?  ? ?  ?Exercises   ? ?  ?General Comments General comments (skin integrity, edema, etc.):  (Reviewed exercise program for B LE's) ?  ?  ? ?Pertinent Vitals/Pain Pain Assessment ?Pain Assessment: 0-10 ?Pain Score: 4  ?Pain Location: L hip ?Pain Descriptors / Indicators: Discomfort ?Pain Intervention(s): Monitored during session  ? ? ?  Home Living   ?  ?  ?  ?  ?  ?  ?  ?  ?  ?   ?  ?Prior Function    ?  ?  ?   ? ?PT Goals (current goals can now be found in the care plan section) Acute Rehab PT Goals ?Patient Stated Goal: to get home and reduce pain ?Progress towards PT goals: Progressing toward goals ? ?  ?Frequency ? ? ? Min 5X/week ? ? ? ?  ?PT Plan Current plan remains appropriate   ? ? ?Co-evaluation   ?  ?  ?  ?  ? ?  ?AM-PAC PT "6 Clicks" Mobility   ?Outcome Measure ? Help needed turning from your back to your side while in a flat bed without using bedrails?: A Little ?Help needed moving from lying on your back to sitting on the side of a flat bed without using bedrails?: A Little ?Help needed moving to and from a bed to a chair (including a wheelchair)?: A Little ?Help needed standing up from a chair using your arms (e.g., wheelchair or bedside chair)?: A Little ?Help needed to walk in hospital room?: A Little ?Help needed climbing 3-5 steps with a railing? : A Lot ?6 Click Score: 17 ? ?  ?End of Session Equipment Utilized During Treatment: Gait belt ?Activity Tolerance: Patient tolerated treatment well ?Patient left: in chair;with call bell/phone within reach;with chair alarm set ?Nurse Communication: Mobility status ?PT Visit Diagnosis: Unsteadiness on feet (R26.81);Muscle weakness (generalized) (M62.81);Pain ?Pain - Right/Left: Left ?Pain - part of body: Hip ?  ? ? ?Time: 1015-1040 ?PT Time Calculation (min) (ACUTE ONLY): 25 min ? ?Charges:  $Gait Training: 8-22 mins ?$Therapeutic Exercise: 8-22 mins          ?[name], PTA ? ? ? ?[name] ?10/13/2021, 12:35 PM ? ?

## 2021-10-13 NOTE — TOC Transition Note (Signed)
Transition of Care (TOC) - CM/SW Discharge Note ? ? ?Patient Details  ?Name: Trenyce Loera ?MRN: [id number] ?Date of Birth: 04/05/1941 ? ?Transition of Care (TOC) CM/SW Contact:  ?Ed[place], LCSW ?Phone Number: ?10/13/2021, 12:38 PM ? ? ?Clinical Narrative:    ? ? PASSR Received  [id number] F Expires 11/12/2021 ? ?Patient will DC to:  [facility] ?Anticipated DC date: 10/13/21 ?Family notified: Pt notified son and daughter in law ?Transport by: Corey Harold ? ? ?Per MD patient ready for DC to [facility]. RN, patient, patient's family, and facility notified of DC. Discharge Summary and FL2 sent to facility. RN to call report prior to discharge ([phone]). DC packet on chart. Ambulance transport requested for patient.  ? ?CSW will sign off for now as social work intervention is no longer needed. Please consult [place] again if new needs arise.  ? ? ?Final next level of care: [place] ?Barriers to Discharge: No Barriers Identified ? ? ?Patient Goals and CMS Choice ?  ?  ?  ? ?Discharge Placement ?  ?           ?Patient chooses bed at: [facility] [place] ?Patient to be transferred to facility by: [facility] ?  ?Patient and family notified of of transfer: 10/13/21 ? ?Discharge Plan and Services ?  ?  ?           ?  ?  ?  ?  ?  ?  ?  ?  ?  ?  ? ?Social Determinants of Health (SDOH) Interventions ?  ? ? ?Readmission Risk Interventions ?   ? View : No data to display.  ?  ?  ?  ? ? ? ? ? ?

## 2021-10-13 NOTE — Progress Notes (Signed)
?PROGRESS NOTE ? ? ? ?Marissa Hall  LNL:[id number] DOB: 01/07/1941 DOA: 10/05/2021 ?PCP: Tammi Sou, MD  ? ? ?Brief Narrative:  ?Marissa Hall is a 81 y.o. female with past medical history of type 2 diabetes, hypertension, hyperlipidemia, hypothyroidism, history of DVT status post 6 months of Xarelto, recurrent hyperkalemia, recurrent syncope, history of CVA, depression and anxiety and alcohol use disorder presented to hospital after sustaining a syncopal event and hurting her hip.  Has history of wearing a monitor for recurrent syncope in the past and follows up with Dr. Bettina Gavia cardiology as outpatient with mention of PACs and some PVCs.  Patient  stated that she drinks too much alcohol at home. In the ED, patient was noted to have stable vitals.  WBC was 8.8 hemoglobin of 13.1.  Creatinine 0.6.  CT head was negative for fracture.  CT pelvis showed acute fracture of the proximal left femur.  CT C-spine with multilevel degenerative changes.  Orthopedics was consulted from the ED and patient was admitted hospital for further evaluation and treatment. ? ?Assessment and Plan: ? ?Principal Problem: ?  Closed fracture of proximal end of left femur, initial encounter ([place]) ?Active Problems: ?  Syncope and collapse ?  Hyperkalemia ?  Alcohol use ?  Diabetes mellitus type 2 without retinopathy ([place]) ?  Hypertension associated with diabetes ([place]) ?  Anxiety and depression ?  Hypothyroidism ?  Hyperlipidemia associated with type 2 diabetes mellitus ([place]) ?  Malnutrition of moderate degree ?  ?Closed fracture of proximal end of left femur, initial encounter ([place]) ?Patient was seen by orthopedics and underwent total hip arthroplasty on 10/06/2021.  On Xarelto for DVT prophylaxis.     Awaiting PASSAR # for SNF discharge.  ?  ?Syncope and collapse ?History of previous syncopes and cardiac event monitor in the past showing rare PACs and PVCs.  Follows up with Dr. Bettina Gavia as outpatient.   Will need to follow-up with  cardiology as outpatient. ? ?Alcohol use order ?Reports drinking at least a bottle of wine and several beers daily.  Last drink night of admission prior to fall.  Patient did not have any obvious signs of withdrawal during hospitalization. ? ?Diabetes mellitus type 2 without retinopathy ([place]) ?Continue sliding scale insulin, Accu-Cheks diabetic diet while in the hospital.  Takes metformin at home.  Latest POC glucose of 191 ?  ?Hypertension  ? Takes Coreg, valsartan and hydralazine at home.  Was orthostatic with PT so home medications have been reduced.  Continue to monitor blood pressure ? ?Hyperlipidemia associated with type 2 diabetes mellitus ([place]) ?Continue Lipitor. ?  ?Hypothyroidism ?Continue Synthroid. ?  ?Anxiety and depression ?Continue Cymbalta and trazodone.  ?  ? DVT prophylaxis: rivaroxaban (XARELTO) tablet 10 mg Start: 10/07/21 0800 ?SCDs Start: 10/06/21 1756 ?Place TED hose Start: 10/06/21 1756 ?rivaroxaban (XARELTO) tablet 10 mg  ? ?Code Status:   ?  Code Status: DNR ? ?Disposition:  ?Skilled nursing facility pending PASSAR, medically stable for disposition. ? ?Status is: Inpatient ? ?Remains inpatient appropriate because: Awaiting for skilled nursing facility,  ? ? Family Communication:  ?I spoke with the patient's son on 10/12/2021 ? ?Consultants:  ?Orthopedics ? ?Procedures:  ?Total left hip arthroplasty on 10/06/2021 ? ?Antimicrobials:  ?None ? ? ?Subjective: ?Today, patient was seen and examined at bedside.  Denies interval complaints.  Inquiring about getting discharged.   ? ?Objective: ?Vitals:  ? 10/12/21 0429 10/12/21 1421 10/12/21 1955 10/13/21 0431  ?BP: (!) 143/69 (!) 127/97 118/66 138/66  ?Pulse: 76  71 65 66  ?Resp: '18  18 16  '$ ?Temp: 97.8 ?F (36.6 ?C) 97.6 ?F (36.4 ?C) 97.8 ?F (36.6 ?C) 97.9 ?F (36.6 ?C)  ?TempSrc: Oral Oral  Oral  ?SpO2: 95% 97% 97% 95%  ?Weight:      ?Height:      ? ? ?Intake/Output Summary (Last 24 hours) at 10/13/2021 1133 ?Last data filed at 10/12/2021 1803 ?Gross per 24  hour  ?Intake 236 ml  ?Output --  ?Net 236 ml  ? ?Filed Weights  ? 10/05/21 2141  ?Weight: 73.1 kg  ? ? ?Physical Examination: ?Body mass index is 24.51 kg/m?.  ?General:  Average built, not in obvious distress ?HENT:   No scleral pallor or icterus noted. Oral mucosa is moist.  ?Chest:  Clear breath sounds.  Diminished breath sounds bilaterally. No crackles or wheezes.  ?CVS: S1 &S2 heard. No murmur.  Regular rate and rhythm. ?Abdomen: Soft, nontender, nondistended.  Bowel sounds are heard.   ?Extremities: No cyanosis, clubbing or edema.  Peripheral pulses are palpable.  Left hip surgical site healthy with mild edema and dressing. ?Psych: Alert, awake and oriented, normal mood ?CNS:  No cranial nerve deficits.  Power equal in all extremities.   ?Skin: Warm and dry.   ? ?Data Reviewed:  ? ?CBC: ?Recent Labs  ?Lab 10/07/21 ?1749 10/08/21 ?4496 10/10/21 ?7591 10/11/21 ?1134  ?WBC 4.9 5.7 4.8 4.5  ?HGB 10.6* 10.2* 9.2* 9.9*  ?HCT 31.8* 30.7* 27.2* 29.6*  ?MCV 98.8 97.5 98.9 98.0  ?PLT 122* 114* 134* 202  ? ? ?Basic Metabolic Panel: ?Recent Labs  ?Lab 10/07/21 ?6384 10/08/21 ?6659 10/10/21 ?9357 10/12/21 ?0501  ?NA 136 133* 134* 137  ?K 4.4 4.2 4.7 4.7  ?CL 104 97* 97* 98  ?CO2 '25 27 31 30  '$ ?GLUCOSE 163* 204* 208* 196*  ?BUN '14 13 18 21  '$ ?CREATININE 0.92 0.74 0.74 0.75  ?CALCIUM 8.6* 8.9 9.1 9.3  ?MG  --  2.0  --  2.4  ? ? ?Liver Function Tests: ?No results for input(s): AST, ALT, ALKPHOS, BILITOT, PROT, ALBUMIN in the last 168 hours. ? ? ?Radiology Studies: ?No results found. ? ? ? LOS: 7 days  ? ? ?Flora Lipps, MD ?[facility] ?Available via Epic secure chat 7am-7pm ?After these hours, please refer to coverage provider listed on [email or web address] ?10/13/2021, 11:33 AM  ?  ?

## 2021-10-15 ENCOUNTER — Telehealth: Payer: Self-pay | Admitting: Orthopaedic Surgery

## 2021-10-15 DIAGNOSIS — S72002D Fracture of unspecified part of neck of left femur, subsequent encounter for closed fracture with routine healing: Secondary | ICD-10-CM | POA: Diagnosis not present

## 2021-10-15 DIAGNOSIS — D649 Anemia, unspecified: Secondary | ICD-10-CM | POA: Diagnosis not present

## 2021-10-15 DIAGNOSIS — R262 Difficulty in walking, not elsewhere classified: Secondary | ICD-10-CM | POA: Diagnosis not present

## 2021-10-15 DIAGNOSIS — G8918 Other acute postprocedural pain: Secondary | ICD-10-CM | POA: Diagnosis not present

## 2021-10-15 NOTE — Telephone Encounter (Signed)
Pt returned call waiting for return call for 2 wk post op. Betsy please call pt Monday 10/18/21 Pt also states son need to be with her for appt. Pt phone number is [phone]. ?

## 2021-10-15 NOTE — Telephone Encounter (Signed)
Pt had hip replacement 5/5 and need a 2 wk post op appt. Dr Lorin Mercy nor PA Ricard Dillon has any opening. Please call pt and open slot. Pt phone number is [phone]. ?

## 2021-10-18 NOTE — Telephone Encounter (Signed)
Duplicate message in chart.  

## 2021-10-18 NOTE — Telephone Encounter (Signed)
I spoke with patient. She will be here in the morning. ?

## 2021-10-18 NOTE — Telephone Encounter (Signed)
I called patient, voicemail is full and cannot leave message. I have added her to Dr. Lorin Mercy schedule tomorrow morning at 1030. If she returns call, please let her know about appointment. If this does not work for her and son, please work in for Friday afternoon. ?

## 2021-10-18 NOTE — Telephone Encounter (Signed)
I called to advise again. Mailbox is full and cannot accept new messages.  ?

## 2021-10-19 ENCOUNTER — Other Ambulatory Visit: Payer: Self-pay | Admitting: Family Medicine

## 2021-10-19 ENCOUNTER — Ambulatory Visit (INDEPENDENT_AMBULATORY_CARE_PROVIDER_SITE_OTHER): Payer: Medicare Other | Admitting: Orthopaedic Surgery

## 2021-10-19 ENCOUNTER — Ambulatory Visit (INDEPENDENT_AMBULATORY_CARE_PROVIDER_SITE_OTHER): Payer: Medicare Other

## 2021-10-19 ENCOUNTER — Encounter: Payer: Self-pay | Admitting: Orthopaedic Surgery

## 2021-10-19 VITALS — BP 126/59 | HR 69 | Ht 68.0 in | Wt 161.0 lb

## 2021-10-19 DIAGNOSIS — Z96649 Presence of unspecified artificial hip joint: Secondary | ICD-10-CM | POA: Insufficient documentation

## 2021-10-19 DIAGNOSIS — Z96642 Presence of left artificial hip joint: Secondary | ICD-10-CM | POA: Diagnosis not present

## 2021-10-19 DIAGNOSIS — S72002A Fracture of unspecified part of neck of left femur, initial encounter for closed fracture: Secondary | ICD-10-CM

## 2021-10-19 NOTE — Progress Notes (Signed)
? ?Post-Op Visit Note ?  ?Patient: Marissa Hall           ?Date of Birth: Oct 15, 1940           ?MRN: [id number] ?Visit Date: 10/19/2021 ?PCP: Tammi Sou, MD ? ? ?Assessment & Plan: Post left total hip arthroplasty.  Seizure once good staples removed she is walking in the hall and will be going home Thursday.  Son will be staying with her for a few days.  Return 2 months for final visit.  She is not taking any pain medication. ? ?Chief Complaint:  ?Chief Complaint  ?Patient presents with  ? Left Hip - Routine Post Op  ?  10/06/2021 Left THA  ? ?Visit Diagnoses:  ?1. S/P total left hip arthroplasty   ?2. Status post total replacement of left hip   ?3. Closed fracture of proximal end of left femur, initial encounter ([place])   ? ? ?Plan: Return in 2 months no x-ray needed on return. ? ?Follow-Up Instructions: Return in about 2 months (around 12/19/2021).  ? ?Orders:  ?Orders Placed This Encounter  ?Procedures  ? XR HIP UNILAT W OR W/O PELVIS 2-3 VIEWS LEFT  ? ?No orders of the defined types were placed in this encounter. ? ? ?Imaging: ?No results found. ? ?PMFS History: ?Patient Active Problem List  ? Diagnosis Date Noted  ? S/P total hip arthroplasty 10/19/2021  ? Closed fracture of proximal end of left femur, initial encounter ([place]) 10/06/2021  ? Hyperkalemia 10/06/2021  ? Alcohol use 10/06/2021  ? Malnutrition of moderate degree 10/06/2021  ? Syncope and collapse 06/10/2021  ? Closed fracture of multiple ribs of right side 04/30/2021  ? Acquired hypothyroidism 02/23/2021  ? Type 2 diabetes mellitus with diabetic polyneuropathy, without long-term current use of insulin ([place]) 02/23/2021  ? 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  ? Diabetes mellitus type 2 without retinopathy ([place]) 09/21/2020  ? Meibomian gland dysfunction (MGD) of both eyes 09/21/2020  ? History of COVID-19 06/23/2020  ? COVID-19 virus infection 05/27/2020  ? Piriformis syndrome of right side 05/16/2019  ? Lower extremity pain, right 04/23/2019  ? DVT  (deep venous thrombosis) ([place]) 04/23/2019  ? Lumbar radiculopathy, right 04/02/2019  ? Cataracts, bilateral 07/2017  ? Other long term (current) drug therapy 06/14/2017  ? Anxiety and depression   ? History of breast cancer 03/02/2017  ? History of therapeutic radiation 03/02/2017  ? Breast pain, right 10/03/2016  ? Type 2 diabetes mellitus ([place]) 08/23/2016  ? Hypertension associated with diabetes ([place]) 08/23/2016  ? Hypothyroidism 08/23/2016  ? Chemotherapy-induced neuropathy ([place]) 07/04/2016  ? Port catheter in place 05/09/2016  ? Peripheral neuropathy 05/09/2016  ? Breast cancer of upper-inner quadrant of right female breast ([place]) 03/09/2016  ? Major depressive disorder, recurrent ([place]) 02/27/2014  ? Uncomplicated alcohol dependence ([place]) 02/19/2014  ? Osteoporosis 2015  ? GERD (gastroesophageal reflux disease) 2013  ? TIA (transient ischemic attack) 03/26/2011  ? Epidermoid cyst of vulva 2008  ? Hyperlipidemia associated with type 2 diabetes mellitus ([place]) 1986  ? ?Past Medical History:  ?Diagnosis Date  ? Acute DVT (deep venous thrombosis) ([place]) 04/23/2019  ? Right popliteal  ? Alcoholism ([place])   ? Anxiety and depression 1964  ? oncologist started duloxetine 12/2016  ? Breast cancer ([place]) 03/14/2016  ? Clinical stage 2A: (triple neg): Right breast, upper inner quadrant, 03/2016.  Neoadjuvant chemo x 5 cycles,lumpectomy 4 mo later, then RT started 10/2016.  Adjuvant Xeloda [id number].  SWOG research trial pt 04/2017--pt randomized to  pembrolizumab immunotherapy.  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  ? Cataracts, bilateral 07/2017  ? Chemotherapy-induced neuropathy ([place]) 07/04/2016  ? feet; responding well to cymbalta  ? COVID-19 virus infection 05/27/2020  ? Depression 1964  ? Patient states since age 41  ? Diabetes mellitus with complication [facility]) 5176  ? managed by endocrinology.  A1c Mar 12, 2018 was 7.0% at Dr. Shirlyn Goltz.    ? Epidermoid cyst of vulva   ? Chronic epidermoid cyst of the vulva.  Excision done 02/2019  ? GERD (gastroesophageal reflux disease) 2013  ? Hyperlipidemia 1986  ? Hypertension 2008  ? 2022 ->addition of hctz to lisinopril led to 28m drop in GFR. HCTZ d/cd.  ? Hypothyroidism 1988  ? Diagnosed in her 438s  Managed by Endocrinologist  ? Lumbar radiculopathy 04/2019  ? Dr. STamala Julianto get plain films of LB and hip (considering MRI due to her hx of cancer)  ? Nonischemic cardiomyopathy ([place]   ? Hx of takotsubo CM  ? Osteoporosis 2015  ? pt states "osteopenia", but then says that she refused to take the rx med for this condition, so I suspect she had osteoporosis.  ? Peripheral neuropathy 2017  ? Patient states diabetic neuropathy in feet prior to starting chemotherapy and then worsened by chemo.   ? Syncope and collapse   ? [place]  No prodrome.  Monitor ordered and cardiology referral ordered 05/14/21  ? TIA (transient ischemic attack) 03/26/2011  ? 2012: question of (HA + R eye "floaters"). CT in ED neg acute. Not admitted, no f/u testing done.  ?ocular migraine?  ?  ?Family History  ?Problem Relation Age of Onset  ? Stroke Mother   ? Suicidality Father   ? Stroke Brother   ? Stroke Son   ? Sleep apnea Son   ?  ?Past Surgical History:  ?Procedure Laterality Date  ? ABDOMINAL HYSTERECTOMY  1972  ? APPENDECTOMY  1972  ? [place] ? BREAST IMPLANT REMOVAL Right 09/13/2016  ? Procedure: REMOVAL RIGHT BREAST IMPLANT;  Surgeon: BIrene Limbo MD;  [place] [facility]  Service: Plastics;  Laterality: Right;  ? BREAST LUMPECTOMY WITH RADIOACTIVE SEED AND SENTINEL LYMPH NODE BIOPSY Right 09/13/2016  ? Procedure: RIGHT BREAST LUMPECTOMY WITH RADIOACTIVE SEED X 2 AND SENTINEL LYMPH NODE BIOPSY;  Surgeon: DAlphonsa Overall MD;  [place] [facility]  Service: General;  Laterality: Right;  ? BREAST SURGERY Right 03/14/2016  ? Biopsy  ? CAPSULECTOMY Right  09/13/2016  ? Procedure: RIGHT CAPSULECTOMY;  Surgeon: BIrene Limbo MD;  [place] [facility]  Service: Plastics;  Laterality: Right;  ? CATARACT EXTRACTION, BILATERAL Bilateral 08/10/17 right eye, 08/31/17 left eye  ? MASS EXCISION Left 02/28/2018  ? Path: benign.  Procedure: EXCISIONLEFT MEDIAL THIGH MASS ERAS PATHWAY;  Surgeon: CErroll Luna MD;  [place] [facility]  Service: General;  Laterality: Left;  ? PORTACATH PLACEMENT N/A 03/15/2016  ? Procedure: INSERTION PORT-A-CATH WITH [place]  Surgeon: DAlphonsa Overall MD;  Location: [facility];  Service: General;  Laterality: N/A;  ? PORTACATH REMOVAL  07/2017  ? SLEEP STUDY  09/2021  ? NO SLEEP APNEA  ? surgical repair left ankle Left 2009  ? s/p Fall   ? [place] ? Age 65  ? TOTAL HIP ARTHROPLASTY Left 10/06/2021  ? Procedure: TOTAL  HIP ARTHROPLASTY ANTERIOR APPROACH;  Surgeon: [name], MD;  Location: [facility];  Service: Orthopedics;  Laterality: Left;  ? US CAROTID DOPPLER BILATERAL ([place] HX)  01/2021  ? <50% bilat int carotid sten, otherwise normal.  ? ?Social History  ? ?Occupational History  ? Not on file  ?Tobacco Use  ? Smoking status: Former  ?  Packs/day: 1.00  ?  Types: Cigarettes  ?  Quit date: 03/08/1999  ?  Years since quitting: 22.6  ? Smokeless tobacco: Never  ?Vaping Use  ? Vaping Use: Never used  ?Substance and Sexual Activity  ? Alcohol use: Yes  ?  Alcohol/week: 7.0 standard drinks  ?  Types: 7 Shots of liquor per week  ?  Comment:  daily  ? Drug use: No  ? Sexual activity: Not Currently  ?  Partners: Male  ?  Birth control/protection: Surgical  ?  Comment: 1st intercourse- 19, partners- 10+, current partner- 8 yrs, hysterectomy  ? ? ? ?

## 2021-10-22 NOTE — Progress Notes (Signed)
Patient Care Team: Tammi Sou, MD as PCP - General (Family Medicine) Alphonsa Overall, MD as Consulting Physician (General Surgery) Nicholas Lose, MD as Consulting Physician (Hematology and Oncology) Kyung Rudd, MD as Consulting Physician (Radiation Oncology) Irene Limbo, MD as Consulting Physician (Plastic Surgery) Causey, Charlestine Massed, NP as Nurse Practitioner (Hematology and Oncology) Princess Bruins, MD as Consulting Physician (Obstetrics and Gynecology) Tsamis, Constance Haw, MD as Referring Physician (Ophthalmology) [facility], Melanie Crazier, MD as Consulting Physician (Endocrinology)  DIAGNOSIS:  Encounter Diagnosis  Name Primary?   Malignant neoplasm of upper-inner quadrant of right breast in female, estrogen receptor negative ([place])     SUMMARY OF ONCOLOGIC HISTORY: Oncology History  Breast cancer of upper-inner quadrant of right female breast ([place])  02/29/2016 Initial Diagnosis   Right breast palpable mass (with silicone implants 3300), 3.5 cm on MRI, additional 3 cm anterior linear enhancement? 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 Ki-67 60%, T2 N0 stage 2A clinical stage    03/14/2016 Procedure   Right breast biopsy upper inner quadrant: IDC grade 3    03/28/2016 -  Neo-Adjuvant Chemotherapy   Neoadjuvant chemotherapy with dose dense Adriamycin and Cytoxan followed by Abraxane weekly 5 ( patient is diabetic and cannot take steroids)    07/15/2016 Breast MRI   Right breast: Spiculated mass 1.5 cm significantly smaller compared to prior,NME previously seen is not noted, no abnormal lymph nodes    09/13/2016 Surgery   Removal of the silicone implant due to intracapsular rupture and capsulectomy (Dr.Thimappa)    09/13/2016 Surgery   Right lumpectomy: IDC grade 3, 2 foci, 2 cm and 1.1 cm, 0/3 lymph nodes negative margins negative, ER 0%, PR 0%, HER-2 negative ratio 1.02, Ki-67 60%, RCB-II; ypT2ypN0 Stage 2A     10/20/2016 - 12/06/2016  Radiation Therapy   Adjuvant radiation therapy    12/30/2016 - 04/05/2017 Chemotherapy   Xeloda 1000 mg by mouth twice a day adjuvant therapy x 4 cycles     05/03/2017 - 05/24/2017 Chemotherapy   SWOG S 1418 Pembrolizumab on clinical trial stopped after 2 doses by patient preference (not due to toxicities .)      CHIEF COMPLIANT:  Follow-up of triple negative breast cancer.  INTERVAL HISTORY: Marissa Hall is a 81 y.o. with above-mentioned history of right breast cancer treated with neoadjuvant chemotherapy, lumpectomy, radiation, and is currently on surveillance. 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 She presents to the clinic today for follow-up. States that some days she can't feel her feet. Complains of neuropathy.   ALLERGIES:  is allergic to other, prednisone, amlodipine, hydrochlorothiazide, and penicillins.  MEDICATIONS:  Current Outpatient Medications  Medication Sig Dispense Refill   atorvastatin (LIPITOR) 80 MG tablet Take 1 tablet (80 mg total) by mouth every evening. 90 tablet 3   carvedilol (COREG) 3.125 MG tablet Take 1 tablet (3.125 mg total) by mouth 2 (two) times daily with a meal.     cholecalciferol (VITAMIN D3) 25 MCG (1000 UNIT) tablet Take 1,000 Units by mouth daily.     DULoxetine (CYMBALTA) 60 MG capsule Take 1 capsule (60 mg total) by mouth daily. 90 capsule 1   folic acid (FOLVITE) 1 MG tablet Take 1 tablet (1 mg total) by mouth daily.     menthol-cetylpyridinium (CEPACOL) 3 MG lozenge Take 1 lozenge (3 mg total) by mouth as needed for sore throat.     metFORMIN (GLUCOPHAGE-XR) 500 MG 24 hr tablet Take 2 tablets (1,000 mg total) by mouth  daily before supper. 180 tablet 3   Multiple Vitamin (MULTIVITAMIN WITH MINERALS) TABS tablet Take 1 tablet by mouth daily.     ondansetron (ZOFRAN) 4 MG tablet Take 1 tablet (4 mg total) by mouth every 6 (six) hours as needed for nausea.  0   OVER THE COUNTER MEDICATION Take 400 mg by mouth daily.  Palmitoylethanolamide - suggested by Oncologist, study drug for neuropathy     polyethylene glycol (MIRALAX / GLYCOLAX) 17 g packet Take 17 g by mouth daily as needed for severe constipation or moderate constipation.  0   Propylene Glycol (SYSTANE BALANCE OP) Place 1 drop into both eyes daily as needed (for dry eyes).     rivaroxaban (XARELTO) 10 MG TABS tablet Take 1 tablet (10 mg total) by mouth daily. MUST TAKE AT LEAST 4 WEEKS POSTOP FOR DVT PROPHYLAXIS 30 tablet 0   SYNTHROID 125 MCG tablet Take 1 tablet (125 mcg total) by mouth daily before breakfast. 90 tablet 1   thiamine 100 MG tablet Take 1 tablet (100 mg total) by mouth daily.     traZODone (DESYREL) 50 MG tablet Take 1 tablet (50 mg total) by mouth at bedtime. 90 tablet 1   valsartan (DIOVAN) 160 MG tablet Take 1 tablet (160 mg total) by mouth daily.     No current facility-administered medications for this visit.    PHYSICAL EXAMINATION: ECOG PERFORMANCE STATUS: 1 - Symptomatic but completely ambulatory  Vitals:   10/29/21 1134  BP: (!) 159/59  Pulse: 80  Resp: 19  Temp: (!) 97.5 F (36.4 C)  SpO2: 98%   Filed Weights   10/29/21 1134  Weight: 156 lb 14.4 oz (71.2 kg)      LABORATORY DATA:  I have reviewed the data as listed    Latest Ref Rng & Units 10/29/2021   11:21 AM 10/12/2021    5:01 AM 10/10/2021    5:46 AM  CMP  Glucose 70 - 99 mg/dL 276   196   208    BUN 8 - 23 mg/dL _0 Creatinine 0.44 - 1.00 mg/dL 0.87   0.75   0.74    Sodium 135 - 145 mmol/L 139   137   134    Potassium 3.5 - 5.1 mmol/L 4.6   4.7   4.7    Chloride 98 - 111 mmol/L 103   98   97    CO2 22 - 32 mmol/L _1 Calcium 8.9 - 10.3 mg/dL 10.2   9.3   9.1    Total Protein 6.5 - 8.1 g/dL 7.6      Total Bilirubin 0.3 - 1.2 mg/dL 1.5      Alkaline Phos 38 - 126 U/L 79      AST 15 - 41 U/L 18      ALT 0 - 44 U/L 25        Lab Results  Component Value Date   WBC 5.5 10/29/2021   HGB 11.2 (L) 10/29/2021   HCT 33.9  (L) 10/29/2021   MCV 95.2 10/29/2021   PLT 252 10/29/2021   NEUTROABS 3.8 10/29/2021    ASSESSMENT & PLAN:  Breast cancer of upper-inner quadrant of right female breast ([place]) 02/29/2016: Right breast palpable mass (with silicone implants 1478), 3.5 cm on MRI, additional 3 cm anterior linear enhancement (biopsy 03/14/2016 IDC grade 3); 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  T2 N0 stage 2A clinical stage   Treatment summary: 1. Neoadjuvant chemotherapy with dose dense Adriamycin and Cytoxan 4 followed by Abraxane weekly 5  (stopped early due to neuropathy) 2. 09/13/2016: Right lumpectomy: IDC grade 3, 2 foci, 2 cm and 1.1 cm, 0/3 lymph nodes negative margins negative, ER 0%, PR 0%, HER-2 negative ratio 1.02, Ki-67 60%, RCB-II; ypT2ypN0 Stage 2A (with plastic surgery removing the ruptured implant) 3. Followed by radiation therapy completed 12/06/2016 4. Adjuvant Xeloda 1000 mg by mouth twice a day 2 weeks on one week off 12/30/2016- Oct 2018 5. SWOG 1418 clinical trial Pembrolizumab cycle 1 given 05/03/2017 (patient decided to discontinue clinical trial) ----------------------------------------------------------------------------------------------------------------------------------------- Surveillance: 1.  Breast exam 10/29/2021: Benign, no palpable lumps or nodules of concern. 2. mammogram 06/08/2021 at [facility]: No evidence of malignancy.  Breast density category A    Chronic fatigue Chemo-induced peripheral neuropathy: Patient rates it as 5 out of 10 Hospitalization 10/05/2021-10/13/2021: Fracture of the proximal end of left femur: Total hip arthroplasty on 10/06/2021 History of syncope and collapse: I suspect that it was related to her alcohol consumption.  She quit drinking alcohol.  We are hoping that she would have no further issues with syncope.   Return to clinic in 6 months for follow-up and after that once a year    No orders of the defined types were placed in this  encounter.  The patient has a good understanding of the overall plan. she agrees with it. she will call with any problems that may develop before the next visit here. Total time spent: 30 mins including face to face time and time spent for planning, charting and co-ordination of care   Harriette Ohara, MD 10/29/21    I Gardiner Coins am scribing for Dr. Lindi Adie  I have reviewed the above documentation for accuracy and completeness, and I agree with the above.

## 2021-10-25 ENCOUNTER — Telehealth: Payer: Self-pay

## 2021-10-25 NOTE — Telephone Encounter (Signed)
Please review and advise.

## 2021-10-25 NOTE — Telephone Encounter (Signed)
Stop Xarelto until no bleeding noted in stool for 2 days. Is she in a skilled nursing facility or is she at home?

## 2021-10-25 NOTE — Telephone Encounter (Signed)
Pt called stating that since she has been out of the hospital she has been on xarelto. She just recently noticed that she is having bloody stools. Offered pt to schedule appt on Friday. Pt declined stating that she has an appt at the cancer center that day. Pt is scheduled for HFU 05/31.  Please advise

## 2021-10-26 ENCOUNTER — Telehealth: Payer: Self-pay | Admitting: *Deleted

## 2021-10-26 DIAGNOSIS — Z171 Estrogen receptor negative status [ER-]: Secondary | ICD-10-CM

## 2021-10-26 NOTE — Telephone Encounter (Signed)
Pt advised of recommendations. She was taking Aleve along with Xarelto for the past 3-4 days. Her last dose of Xarelto was yesterday and Aleve was Sunday. She is currently at home. She believes the Aleve caused her to have bleeding in her stool.

## 2021-10-26 NOTE — Telephone Encounter (Signed)
[id number]: Patient confirmed her study visit for Lab and Dr. Lindi Adie this Friday 10/29/21.  Foye Spurling, BSN, RN, [employer] Research Nurse II 10/26/2021 11:22 AM

## 2021-10-29 ENCOUNTER — Other Ambulatory Visit: Payer: Self-pay

## 2021-10-29 ENCOUNTER — Encounter: Payer: Self-pay | Admitting: *Deleted

## 2021-10-29 ENCOUNTER — Inpatient Hospital Stay: Payer: Medicare Other

## 2021-10-29 ENCOUNTER — Inpatient Hospital Stay: Payer: Medicare Other | Attending: Hematology and Oncology | Admitting: Hematology and Oncology

## 2021-10-29 DIAGNOSIS — Z171 Estrogen receptor negative status [ER-]: Secondary | ICD-10-CM

## 2021-10-29 DIAGNOSIS — Z79899 Other long term (current) drug therapy: Secondary | ICD-10-CM | POA: Diagnosis not present

## 2021-10-29 DIAGNOSIS — C50211 Malignant neoplasm of upper-inner quadrant of right female breast: Secondary | ICD-10-CM

## 2021-10-29 LAB — CBC WITH DIFFERENTIAL (CANCER CENTER ONLY)
Abs Immature Granulocytes: 0.02 10*3/uL (ref 0.00–0.07)
Basophils Absolute: 0 10*3/uL (ref 0.0–0.1)
Basophils Relative: 1 %
Eosinophils Absolute: 0.1 10*3/uL (ref 0.0–0.5)
Eosinophils Relative: 3 %
HCT: 33.9 % — ABNORMAL LOW (ref 36.0–46.0)
Hemoglobin: 11.2 g/dL — ABNORMAL LOW (ref 12.0–15.0)
Immature Granulocytes: 0 %
Lymphocytes Relative: 20 %
Lymphs Abs: 1.1 10*3/uL (ref 0.7–4.0)
MCH: 31.5 pg (ref 26.0–34.0)
MCHC: 33 g/dL (ref 30.0–36.0)
MCV: 95.2 fL (ref 80.0–100.0)
Monocytes Absolute: 0.4 10*3/uL (ref 0.1–1.0)
Monocytes Relative: 7 %
Neutro Abs: 3.8 10*3/uL (ref 1.7–7.7)
Neutrophils Relative %: 69 %
Platelet Count: 252 10*3/uL (ref 150–400)
RBC: 3.56 MIL/uL — ABNORMAL LOW (ref 3.87–5.11)
RDW: 13.4 % (ref 11.5–15.5)
WBC Count: 5.5 10*3/uL (ref 4.0–10.5)
nRBC: 0 % (ref 0.0–0.2)

## 2021-10-29 LAB — CMP (CANCER CENTER ONLY)
ALT: 25 U/L (ref 0–44)
AST: 18 U/L (ref 15–41)
Albumin: 4.4 g/dL (ref 3.5–5.0)
Alkaline Phosphatase: 79 U/L (ref 38–126)
Anion gap: 9 (ref 5–15)
BUN: 16 mg/dL (ref 8–23)
CO2: 27 mmol/L (ref 22–32)
Calcium: 10.2 mg/dL (ref 8.9–10.3)
Chloride: 103 mmol/L (ref 98–111)
Creatinine: 0.87 mg/dL (ref 0.44–1.00)
GFR, Estimated: >60 mL/min (ref >60)
Glucose, Bld: 276 mg/dL — ABNORMAL HIGH (ref 70–99)
Potassium: 4.6 mmol/L (ref 3.5–5.1)
Sodium: 139 mmol/L (ref 135–145)
Total Bilirubin: 1.5 mg/dL — ABNORMAL HIGH (ref 0.3–1.2)
Total Protein: 7.6 g/dL (ref 6.5–8.1)

## 2021-10-29 NOTE — Research (Signed)
54 Months Follow Up Visit for [id number] Clinical Trial:  Patient into clinic by herself today to see Dr. Lindi Adie for 70 month study visit.   PROs; Not required for today's visit.  Labs; Completed per protocol and reviewed by Dr. Lindi Adie. Concomitant medications; She has stopped taking Xarelto due to GI Bleeding. Dr. Lindi Adie aware and informed patient it is okay to stay off the [place] now. Medication list updated.    H&P, PS, Toxicity and Recurrence assessment;  Completed by Dr. Lindi Adie. See MD encounter note from today. Patient had hip replacement 3 weeks ago s/p a fall. She is ambulating with cane today and without difficulty. Dr. Lindi Adie states the fall and subsequent surgery was not related to patient's participation in the S1418 study. Patient also reports some blood in her stool since the surgery and she thinks it was because she was taking Aleve and Xarelto. She has since stopped the Xarelto. Dr. Lindi Adie states the bleeding is also unrelated to study participation.  Thanked patient very much for her time today and ongoing participation in this study.  Her next study visit is due 05/01/2022 (+/- 4 weeks) and will be scheduled.  Asked patient to call research nurse if any questions or problems prior to next study visit.  She verbalized understanding.   Foye Spurling, BSN, RN Clinical Research Nurse 10/29/2021

## 2021-10-29 NOTE — Assessment & Plan Note (Addendum)
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  T2 N0 stage 2A clinical stage  Treatment summary: 1. Neoadjuvant chemotherapy with dose dense Adriamycin and Cytoxan 4 followed by Abraxane weekly 5(stopped early due to neuropathy) 2. 09/13/2016: Right lumpectomy: IDC grade 3, 2 foci, 2 cm and 1.1 cm, 0/3 lymph nodes negative margins negative, ER 0%, PR 0%, HER-2 negative ratio 1.02, Ki-67 60%, RCB-II; ypT2ypN0 Stage 2A (with plastic surgery removing the ruptured implant) 3. Followed by radiation therapy completed 12/06/2016 4.Adjuvant Xeloda 1000 mg by mouth twice a day 2 weeks on one week off 12/30/2016-Oct 2018 5.SWOG 1418 clinical trial Pembrolizumabcycle 1 given 05/03/2017(patient decided to discontinue clinical trial) ----------------------------------------------------------------------------------------------------------------------------------------- Surveillance: 1.Breast exam 10/29/2021: Benign, no palpable lumps or nodules of concern. 2.mammogram1/3/2023at [facility]: No evidence of malignancy. Breast density categoryA   Chronic fatigue Chemo-induced peripheral neuropathy:Patient rates it as 5 out of 10 Hospitalization 10/05/2021-10/13/2021: Fracture of the proximal end of left femur: Total hip arthroplasty on 10/06/2021 History of syncope and collapse: I suspect that it was related to her alcohol consumption.  She quit drinking alcohol.  We are hoping that she would have no further issues with syncope.   Return to clinic in 6 months for follow-up and after that once a year

## 2021-11-02 ENCOUNTER — Telehealth: Payer: Self-pay | Admitting: Hematology and Oncology

## 2021-11-02 NOTE — Telephone Encounter (Signed)
Scheduled appointment per 5/26 los. Unable to leave a voicemail due to mailbox being full.

## 2021-11-03 ENCOUNTER — Encounter: Payer: Self-pay | Admitting: Family Medicine

## 2021-11-03 ENCOUNTER — Ambulatory Visit (INDEPENDENT_AMBULATORY_CARE_PROVIDER_SITE_OTHER): Payer: Medicare Other | Admitting: Family Medicine

## 2021-11-03 VITALS — BP 128/63 | HR 66 | Temp 98.7°F | Ht 68.0 in | Wt 157.0 lb

## 2021-11-03 DIAGNOSIS — I10 Essential (primary) hypertension: Secondary | ICD-10-CM | POA: Diagnosis not present

## 2021-11-03 DIAGNOSIS — Z8781 Personal history of (healed) traumatic fracture: Secondary | ICD-10-CM

## 2021-11-03 DIAGNOSIS — Z96642 Presence of left artificial hip joint: Secondary | ICD-10-CM

## 2021-11-03 DIAGNOSIS — F1021 Alcohol dependence, in remission: Secondary | ICD-10-CM | POA: Diagnosis not present

## 2021-11-03 DIAGNOSIS — F3342 Major depressive disorder, recurrent, in full remission: Secondary | ICD-10-CM

## 2021-11-03 MED ORDER — CARVEDILOL 3.125 MG PO TABS: 3.1250 mg | ORAL_TABLET | Freq: Two times a day (BID) | ORAL | 1 refills | Status: DC

## 2021-11-03 MED ORDER — VALSARTAN 160 MG PO TABS: 160.0000 mg | ORAL_TABLET | Freq: Every day | ORAL | 1 refills | Status: DC

## 2021-11-03 NOTE — Progress Notes (Signed)
OFFICE VISIT  11/03/2021  CC:  Chief Complaint  Patient presents with   Hospitalization Follow-up    Patient is a 81 y.o. female who presents for follow-up recent left hip total replacement.  HPI: On 10/05/2021 she fell and broke her left hip. She subsequently got total hip arthroplasty on 5/3. She did good postsurgery, went to rehab for little over a week.  She got home last week. She reports feeling well, has some hip ache at times after she sitting too long but has not required any Tylenol or other pain medicine in the last couple weeks  She did have 1 episode of bright red blood in her stool when she was on anticoagulant in the postsurgical time period. She notes that she had also taken an NSAID for pain at the same time.  Only occasional blood pressure check at home since going home from rehab and she states these have overall been normal. She says in the hospital and in the rehab center her blood pressures were quite good.  She went home only on Coreg 3.125 twice daily and valsartan 160 daily (prior to admission she was on Coreg 6.25 twice daily, hydralazine 25 3 times daily, and valsartan 320 daily).  She has not had any alcohol in the last month or so.  She did not feel any withdrawal.  She does not have any cravings for alcohol currently.  She had labs at her oncologist office 5 days ago and it showed that her hemoglobin had risen from 9.9 to 11.2.  Complete metabolic panel was normal 5 days ago.  ROS as above, plus--> no fevers, no CP, no SOB, no wheezing, no cough, no dizziness, no HAs, no rashes, no melena/hematochezia.  No polyuria or polydipsia.  No myalgias or arthralgias.  No focal weakness, paresthesias, or tremors.  No acute vision or hearing abnormalities.  No dysuria or unusual/new urinary urgency or frequency.  No recent changes in lower legs. No n/v/d or abd pain.  No palpitations.    Past Medical History:  Diagnosis Date   Acute DVT (deep venous thrombosis)  ([facility]) 04/23/2019   Right popliteal   Alcoholism ([place])    Anxiety and depression 1964   oncologist started duloxetine 12/2016   Breast cancer ([facility]) 03/14/2016   Clinical stage 2A: (triple neg): Right breast, upper inner quadrant, 03/2016.  Neoadjuvant chemo x 5 cycles,lumpectomy 4 mo later, then RT started 10/2016.  Adjuvant Xeloda [id number].  SWOG research trial pt 04/2017--pt randomized to pembrolizumab immunotherapy.  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   Cataracts, bilateral 07/2017   Chemotherapy-induced neuropathy ([facility]) 07/04/2016   feet; responding well to cymbalta   COVID-19 virus infection 05/27/2020   Depression 1964   Patient states since age 83   Diabetes mellitus with complication ([facility]) 1062   managed by endocrinology.  A1c Mar 12, 2018 was 7.0% at Dr. Shirlyn Goltz.    Epidermoid cyst of vulva    Chronic epidermoid cyst of the vulva.  Excision done 02/2019   GERD (gastroesophageal reflux disease) 2013   Hyperlipidemia 1986   Hypertension 2008   2022 ->addition of hctz to lisinopril led to 78m drop in GFR. HCTZ d/cd.   Hypothyroidism 1988   Diagnosed in her 484s  Managed by Endocrinologist   Lumbar radiculopathy 04/2019   Dr. STamala Julianto get plain films of LB and hip (considering MRI due to her hx of  cancer)   Nonischemic cardiomyopathy ([place])    Hx of takotsubo CM   Osteoporosis 2015   pt states "osteopenia", but then says that she refused to take the rx med for this condition, so I suspect she had osteoporosis.   Peripheral neuropathy 2017   Patient states diabetic neuropathy in feet prior to starting chemotherapy and then worsened by chemo.    Syncope and collapse    05/2021 while in [place].  No prodrome.  Monitor ordered and cardiology referral ordered 05/14/21   TIA (transient ischemic attack) 03/26/2011   2012: question of (HA + R eye "floaters"). CT in ED neg acute. Not admitted, no  f/u testing done.  ?ocular migraine?    Past Surgical History:  Procedure Laterality Date   ABDOMINAL HYSTERECTOMY  1972   APPENDECTOMY  1972   BREAST ENHANCEMENT SURGERY  1982   BREAST IMPLANT REMOVAL Right 09/13/2016   Procedure: REMOVAL RIGHT BREAST IMPLANT;  Surgeon: Irene Limbo, MD;  Location: [facility];  Service: Plastics;  Laterality: Right;   BREAST LUMPECTOMY WITH RADIOACTIVE SEED AND SENTINEL LYMPH NODE BIOPSY Right 09/13/2016   Procedure: RIGHT BREAST LUMPECTOMY WITH RADIOACTIVE SEED X 2 AND SENTINEL LYMPH NODE BIOPSY;  Surgeon: Alphonsa Overall, MD;  Location: [facility];  Service: General;  Laterality: Right;   BREAST SURGERY Right 03/14/2016   Biopsy   CAPSULECTOMY Right 09/13/2016   Procedure: RIGHT CAPSULECTOMY;  Surgeon: Irene Limbo, MD;  Location: [facility];  Service: Plastics;  Laterality: Right;   CATARACT EXTRACTION, BILATERAL Bilateral 08/10/17 right eye, 08/31/17 left eye   MASS EXCISION Left 02/28/2018   Path: benign.  Procedure: EXCISIONLEFT MEDIAL THIGH MASS ERAS PATHWAY;  Surgeon: Erroll Luna, MD;  Location: [facility];  Service: General;  Laterality: Left;   PORTACATH PLACEMENT N/A 03/15/2016   Procedure: INSERTION PORT-A-CATH WITH [place];  Surgeon: Alphonsa Overall, MD;  [facility];  Service: General;  Laterality: N/A;   PORTACATH REMOVAL  07/2017   SLEEP STUDY  09/2021   NO SLEEP APNEA   surgical repair left ankle Left 2009   s/p [place]   Age 62   TOTAL HIP ARTHROPLASTY Left 10/06/2021   Procedure: TOTAL HIP ARTHROPLASTY ANTERIOR APPROACH;  Surgeon: [name], MD;  [facility];  Service: Orthopedics;  Laterality: Left;   US CAROTID DOPPLER BILATERAL ([facility])  01/2021   <50% bilat int carotid sten, otherwise normal.    Outpatient Medications Prior to Visit  Medication Sig Dispense Refill   atorvastatin (LIPITOR) 80 MG tablet Take 1 tablet  (80 mg total) by mouth every evening. 90 tablet 3   cholecalciferol (VITAMIN D3) 25 MCG (1000 UNIT) tablet Take 1,000 Units by mouth daily.     DULoxetine (CYMBALTA) 60 MG capsule Take 1 capsule (60 mg total) by mouth daily. 90 capsule 1   metFORMIN (GLUCOPHAGE-XR) 500 MG 24 hr tablet Take 2 tablets (1,000 mg total) by mouth daily before supper. 180 tablet 3   OVER THE COUNTER MEDICATION Take 400 mg by mouth daily. Palmitoylethanolamide - suggested by Oncologist, study drug for neuropathy     Propylene Glycol (SYSTANE BALANCE OP) Place 1 drop into both eyes daily as needed (for dry eyes).     SYNTHROID 125 MCG tablet Take 1 tablet (125 mcg total) by mouth daily before breakfast. 90 tablet 1   thiamine 100 MG tablet Take 1 tablet (100 mg total) by mouth  daily.     traZODone (DESYREL) 50 MG tablet Take 1 tablet (50 mg total) by mouth at bedtime. 90 tablet 1   carvedilol (COREG) 3.125 MG tablet Take 1 tablet (3.125 mg total) by mouth 2 (two) times daily with a meal.     valsartan (DIOVAN) 160 MG tablet Take 1 tablet (160 mg total) by mouth daily.     folic acid (FOLVITE) 1 MG tablet Take 1 tablet (1 mg total) by mouth daily. (Patient not taking: Reported on 11/03/2021)     Multiple Vitamin (MULTIVITAMIN WITH MINERALS) TABS tablet Take 1 tablet by mouth daily. (Patient not taking: Reported on 11/03/2021)     clindamycin (CLEOCIN) 150 MG capsule Take 150 mg by mouth 4 (four) times daily. (Patient not taking: Reported on 11/03/2021)     No facility-administered medications prior to visit.    Allergies  Allergen Reactions   Other Other (See Comments)    STEROIDS- emotional   Prednisone Other (See Comments)    Other reaction(s): Mental Status Changes (intolerance)   Amlodipine Other (See Comments)    Elevated creatinine   Hydrochlorothiazide Other (See Comments)    Elevated serum creatinine   Penicillins Other (See Comments)    Unsure of reaction, was 81 years old    ROS As per HPI  PE:     11/03/2021    3:35 PM 10/29/2021   11:34 AM 10/19/2021   11:06 AM  Vitals with BMI  Height '5\' 8"'$  '5\' 8"'$  '5\' 8"'$   Weight 157 lbs 156 lbs 14 oz 161 lbs  BMI 23.88 54.62 70.35  Systolic 009 381 829  Diastolic 63 59 59  Pulse 66 80 69    Physical Exam  Gen: Alert, well appearing.  Patient is oriented to person, place, time, and situation. AFFECT: pleasant, lucid thought and speech. No further exam today.  LABS:  Last CBC Lab Results  Component Value Date   WBC 5.5 10/29/2021   HGB 11.2 (L) 10/29/2021   HCT 33.9 (L) 10/29/2021   MCV 95.2 10/29/2021   MCH 31.5 10/29/2021   RDW 13.4 10/29/2021   PLT 252 93/71/6967   Last metabolic panel Lab Results  Component Value Date   GLUCOSE 276 (H) 10/29/2021   NA 139 10/29/2021   K 4.6 10/29/2021   CL 103 10/29/2021   CO2 27 10/29/2021   BUN 16 10/29/2021   CREATININE 0.87 10/29/2021   GFRNONAA >60 10/29/2021   CALCIUM 10.2 10/29/2021   PROT 7.6 10/29/2021   ALBUMIN 4.4 10/29/2021   BILITOT 1.5 (H) 10/29/2021   ALKPHOS 79 10/29/2021   AST 18 10/29/2021   ALT 25 10/29/2021   ANIONGAP 9 10/29/2021   Last hemoglobin A1c Lab Results  Component Value Date   HGBA1C 7.1 (A) 07/20/2021   Last thyroid functions Lab Results  Component Value Date   TSH 10.71 (H) 07/20/2021   T4TOTAL 6.6 05/21/2018   IMPRESSION AND PLAN:  1 hypertension, significantly improved since her hip surgery 1 month ago.  We will continue valsartan 160 mg a day and carvedilol 3.125 twice daily. Electrolytes and renal function normal 5 days ago.  2.  Alcoholism. She is doing great for the last month. Fortunately, she did not have any withdrawal when she stopped drinking. Hepatic function panel normal other than a chronic very mild elevation in total bilirubin, which is most likely [place] disease.  #3 left hip fracture-->now status post left total hip arthroplasty on 10/06/2021. She is doing great.  No pain  medications.  #4 major depressive disorder,  recurrent. Doing well on Cymbalta 60 mg a day. Of note, no significant insomnia lately so she has not had to take any trazodone.  An After Visit Summary was printed and given to the patient.  FOLLOW UP: Return in about 3 months (around 02/03/2022) for routine chronic illness f/u.  Signed:  Crissie Sickles, MD           11/03/2021

## 2021-11-05 ENCOUNTER — Telehealth: Payer: Self-pay | Admitting: Orthopaedic Surgery

## 2021-11-05 NOTE — Telephone Encounter (Signed)
I called and talked with patient. She is unable to sit too long due to increased pain- states that the muscles around incision still very sore. Does not want to Advil but has taken it 2 times in the last week. Wanted to know if timing wise this was still normal?

## 2021-11-05 NOTE — Telephone Encounter (Signed)
Pt requesting a call back from [facility] [facility] [employer] [facility] about her pains from surgery. Please call this pt she states to have medical questions and need advice. Pt phone number is [phone].

## 2021-11-23 ENCOUNTER — Telehealth: Payer: Self-pay | Admitting: Orthopaedic Surgery

## 2021-11-23 NOTE — Telephone Encounter (Signed)
Received medical records release form, $25.00 check/Forwarding to [place] today

## 2021-12-21 ENCOUNTER — Encounter: Payer: Medicare Other | Admitting: Orthopaedic Surgery

## 2021-12-24 ENCOUNTER — Ambulatory Visit (INDEPENDENT_AMBULATORY_CARE_PROVIDER_SITE_OTHER): Payer: Medicare Other | Admitting: Family Medicine

## 2021-12-24 VITALS — BP 140/70 | HR 61 | Temp 97.9°F | Ht 68.0 in | Wt 159.8 lb

## 2021-12-24 DIAGNOSIS — L820 Inflamed seborrheic keratosis: Secondary | ICD-10-CM | POA: Diagnosis not present

## 2021-12-24 DIAGNOSIS — D229 Melanocytic nevi, unspecified: Secondary | ICD-10-CM

## 2021-12-24 DIAGNOSIS — Z1283 Encounter for screening for malignant neoplasm of skin: Secondary | ICD-10-CM

## 2021-12-24 DIAGNOSIS — M899 Disorder of bone, unspecified: Secondary | ICD-10-CM

## 2021-12-24 DIAGNOSIS — L989 Disorder of the skin and subcutaneous tissue, unspecified: Secondary | ICD-10-CM

## 2021-12-24 NOTE — Progress Notes (Signed)
OFFICE VISIT  12/24/2021  CC:  Chief Complaint  Patient presents with   Spot on back    Noticed 3 months ago; thought it went away but noticed the end of last week it reappeared. Feels like a scab and it is rough.    Patient is a 81 y.o. female who presents for skin lesion concern.  HPI: Marissa Hall is concerned about some areas on her skin. She has some rough/bumpy/crusty areas focally on the upper back as well the back of her right knee and some on the lower legs. Has a mole on the left lower leg that has 2 different pigmented brown.  She does not know if it has grown or changed recently.  She has seen a dermatologist in her hometown of [place] but she requests referral to a new dermatologist.  Past Medical History:  Diagnosis Date   Acute DVT (deep venous thrombosis) ([facility]) 04/23/2019   Right popliteal   Alcoholism ([facility])    Anxiety and depression 1964   oncologist started duloxetine 12/2016   Breast cancer ([facility]) 03/14/2016   Clinical stage 2A: (triple neg): Right breast, upper inner quadrant, 03/2016.  Neoadjuvant chemo x 5 cycles,lumpectomy 4 mo later, then RT started 10/2016.  Adjuvant Xeloda [id number].  SWOG research trial pt 04/2017--pt randomized to pembrolizumab immunotherapy.  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   Cataracts, bilateral 07/2017   Chemotherapy-induced neuropathy ([facility]) 07/04/2016   feet; responding well to cymbalta   COVID-19 virus infection 05/27/2020   Depression 1964   Patient states since age 14   Diabetes mellitus with complication ([facility]) 2094   managed by endocrinology.  A1c Mar 12, 2018 was 7.0% at Dr. Shirlyn Goltz.    Epidermoid cyst of vulva    Chronic epidermoid cyst of the vulva.  Excision done 02/2019   GERD (gastroesophageal reflux disease) 2013   Hyperlipidemia 1986   Hypertension 2008   2022 ->addition of hctz to lisinopril led to 77m drop in GFR. HCTZ d/cd.    Hypothyroidism 1988   Diagnosed in her 476s  Managed by Endocrinologist   Lumbar radiculopathy 04/2019   Dr. STamala Julianto get plain films of LB and hip (considering MRI due to her hx of cancer)   Nonischemic cardiomyopathy ([place]    Hx of takotsubo CM   Osteoporosis 2015   pt states "osteopenia", but then says that she refused to take the rx med for this condition, so I suspect she had osteoporosis.   Peripheral neuropathy 2017   Patient states diabetic neuropathy in feet prior to starting chemotherapy and then worsened by chemo.    Syncope and collapse    [place]  No prodrome.  Monitor ordered and cardiology referral ordered 05/14/21   TIA (transient ischemic attack) 03/26/2011   2012: question of (HA + R eye "floaters"). CT in ED neg acute. Not admitted, no f/u testing done.  ?ocular migraine?    Past Surgical History:  Procedure Laterality Date   ABDOMINAL HYSTERECTOMY  1972   APPENDECTOMY  1972   BREAST ENHANCEMENT SURGERY  1982   BREAST IMPLANT REMOVAL Right 09/13/2016   Procedure: REMOVAL RIGHT BREAST IMPLANT;  Surgeon: BIrene Limbo MD;  Location: [facility]  Service: Plastics;  Laterality: Right;   BREAST LUMPECTOMY WITH RADIOACTIVE SEED AND SENTINEL LYMPH NODE BIOPSY Right 09/13/2016   Procedure: RIGHT BREAST LUMPECTOMY WITH RADIOACTIVE SEED X 2 AND  SENTINEL LYMPH NODE BIOPSY;  Surgeon: Alphonsa Overall, MD;  Location: [facility];  Service: General;  Laterality: Right;   BREAST SURGERY Right 03/14/2016   Biopsy   CAPSULECTOMY Right 09/13/2016   Procedure: RIGHT CAPSULECTOMY;  Surgeon: Irene Limbo, MD;  Location: [facility];  Service: Plastics;  Laterality: Right;   CATARACT EXTRACTION, BILATERAL Bilateral 08/10/17 right eye, 08/31/17 left eye   MASS EXCISION Left 02/28/2018   Path: benign.  Procedure: EXCISIONLEFT MEDIAL THIGH MASS ERAS PATHWAY;  Surgeon: Erroll Luna, MD;  Location: [facility];   Service: General;  Laterality: Left;   PORTACATH PLACEMENT N/A 03/15/2016   Procedure: INSERTION PORT-A-CATH WITH [place];  Surgeon: Alphonsa Overall, MD;  [facility];  Service: General;  Laterality: N/A;   PORTACATH REMOVAL  07/2017   SLEEP STUDY  09/2021   NO SLEEP APNEA   surgical repair left ankle Left 2009   s/p [place]   Age 41   TOTAL HIP ARTHROPLASTY Left 10/06/2021   Procedure: TOTAL HIP ARTHROPLASTY ANTERIOR APPROACH;  Surgeon: [name], MD;  [facility];  Service: Orthopedics;  Laterality: Left;   US CAROTID DOPPLER BILATERAL ([facility])  01/2021   <50% bilat int carotid sten, otherwise normal.    Outpatient Medications Prior to Visit  Medication Sig Dispense Refill   atorvastatin (LIPITOR) 80 MG tablet Take 1 tablet (80 mg total) by mouth every evening. 90 tablet 3   carvedilol (COREG) 3.125 MG tablet Take 1 tablet (3.125 mg total) by mouth 2 (two) times daily with a meal. 180 tablet 1   cholecalciferol (VITAMIN D3) 25 MCG (1000 UNIT) tablet Take 1,000 Units by mouth daily.     DULoxetine (CYMBALTA) 60 MG capsule Take 1 capsule (60 mg total) by mouth daily. 90 capsule 1   folic acid (FOLVITE) 1 MG tablet Take 1 tablet (1 mg total) by mouth daily.     metFORMIN (GLUCOPHAGE-XR) 500 MG 24 hr tablet Take 2 tablets (1,000 mg total) by mouth daily before supper. 180 tablet 3   Multiple Vitamin (MULTIVITAMIN WITH MINERALS) TABS tablet Take 1 tablet by mouth daily.     OVER THE COUNTER MEDICATION Take 400 mg by mouth daily. Palmitoylethanolamide - suggested by Oncologist, study drug for neuropathy     Propylene Glycol (SYSTANE BALANCE OP) Place 1 drop into both eyes daily as needed (for dry eyes).     SYNTHROID 125 MCG tablet Take 1 tablet (125 mcg total) by mouth daily before breakfast. 90 tablet 1   thiamine 100 MG tablet Take 1 tablet (100 mg total) by mouth daily.     traZODone (DESYREL) 50 MG tablet Take 1 tablet (50 mg total) by mouth at  bedtime. 90 tablet 1   valsartan (DIOVAN) 160 MG tablet Take 1 tablet (160 mg total) by mouth daily. 90 tablet 1   No facility-administered medications prior to visit.    Allergies  Allergen Reactions   Other Other (See Comments)    STEROIDS- emotional   Prednisone Other (See Comments)    Other reaction(s): Mental Status Changes (intolerance)   Amlodipine Other (See Comments)    Elevated creatinine   Hydrochlorothiazide Other (See Comments)    Elevated serum creatinine   Penicillins Other (See Comments)    Unsure of reaction, was 81 years old    ROS As per HPI  PE:    12/24/2021    1:27 PM 12/24/2021  1:17 PM 11/03/2021    3:35 PM  Vitals with BMI  Height  '5\' 8"'$  '5\' 8"'$   Weight  159 lbs 13 oz 157 lbs  BMI  95.6 38.75  Systolic 643 329 518  Diastolic 70 71 63  Pulse  61 66     Physical Exam  Gen: Alert, well appearing.  Patient is oriented to person, place, time, and situation. AFFECT: pleasant, lucid thought and speech. Scattered small patches of light brown and pinkish macules with mild superficial flaking--anterior surfaces of both lower legs as well as similar lesions on the upper back.  The ones on the upper back are excoriated. She has a few waxy flat papules on the abdomen and lower extremities, light brown. Lateral aspect of left lower leg near the level of the head of the fibula has a light brown nevus that has a small area of dark brown in the superolateral quadrant.  Lesion size is 10 mm x 6 mm  LABS:  Last CBC Lab Results  Component Value Date   WBC 5.5 10/29/2021   HGB 11.2 (L) 10/29/2021   HCT 33.9 (L) 10/29/2021   MCV 95.2 10/29/2021   MCH 31.5 10/29/2021   RDW 13.4 10/29/2021   PLT 252 84/16/6063   Last metabolic panel Lab Results  Component Value Date   GLUCOSE 276 (H) 10/29/2021   NA 139 10/29/2021   K 4.6 10/29/2021   CL 103 10/29/2021   CO2 27 10/29/2021   BUN 16 10/29/2021   CREATININE 0.87 10/29/2021   GFRNONAA >60 10/29/2021    CALCIUM 10.2 10/29/2021   PROT 7.6 10/29/2021   ALBUMIN 4.4 10/29/2021   BILITOT 1.5 (H) 10/29/2021   ALKPHOS 79 10/29/2021   AST 18 10/29/2021   ALT 25 10/29/2021   ANIONGAP 9 10/29/2021   Lab Results  Component Value Date   HGBA1C 7.1 (A) 07/20/2021   IMPRESSION AND PLAN:  Multiple skin lesions, chronic. Some of these are consistent with actinic keratoses and some seborrheic keratoses The 1 on her left lower leg has atypical nevus characteristics Discussed elliptical excision versus shave excision today.  Due to the location I am hesitant to do elliptical excision because of tightness and paucity of overlying skin/soft tissue. Decided to do shave excision today and sent to pathology. Refer to dermatology as well.  Procedure: skin lesion removal.  Consent obtained.  Area prepped and draped after being infiltrated with 1 ml 1% lido w/epi.  Sterile technique utilized to excise the lesion at its base with dermablade shave excision technique.    No immediate complications.  Pt tolerated procedure well. Specimen sent to pathology.  Wound care instructions discussed.  An After Visit Summary was printed and given to the patient.  FOLLOW UP: Return if symptoms worsen or fail to improve.  Signed:  Crissie Sickles, MD           12/24/2021

## 2021-12-29 ENCOUNTER — Ambulatory Visit (INDEPENDENT_AMBULATORY_CARE_PROVIDER_SITE_OTHER): Payer: Medicare Other | Admitting: Surgery

## 2021-12-29 ENCOUNTER — Encounter: Payer: Self-pay | Admitting: Surgery

## 2021-12-29 VITALS — BP 194/82 | HR 65

## 2021-12-29 DIAGNOSIS — Z96642 Presence of left artificial hip joint: Secondary | ICD-10-CM

## 2021-12-29 NOTE — Progress Notes (Signed)
81 year old white female returns.  She is about 80-monthstatus post left total hip replacement.  Doing very well and she is very pleased up at this point.  She has already returned back to work.   Exam Patient ambulates very well.  No pain with left hip internal/external rotation   Plan Patient will gradually increase her activities.  Follow-up in 3 months for recheck and we will get 656-monthostop x-rays at that time.  Return sooner if needed.

## 2022-01-11 ENCOUNTER — Ambulatory Visit
Admission: EM | Admit: 2022-01-11 | Discharge: 2022-01-11 | Disposition: A | Discharge status: Home / Self Care | Payer: Medicare Other | Attending: Family Medicine | Admitting: Family Medicine

## 2022-01-11 DIAGNOSIS — H60502 Unspecified acute noninfective otitis externa, left ear: Secondary | ICD-10-CM

## 2022-01-11 DIAGNOSIS — H6123 Impacted cerumen, bilateral: Secondary | ICD-10-CM | POA: Diagnosis not present

## 2022-01-11 MED ORDER — NEOMYCIN-POLYMYXIN-HC 3.5-10000-1 OT SOLN: 4.0000 [drp] | Freq: Four times a day (QID) | OTIC | 0 refills | Status: AC

## 2022-01-11 NOTE — ED Triage Notes (Signed)
The patient states she has clogged ears and the left is worse (started: yesterday). The patient states she put peroxide and ear drops in with no relief.

## 2022-01-11 NOTE — ED Notes (Signed)
Attempt to call patient from lobby, patient is in the restroom.

## 2022-01-11 NOTE — ED Provider Notes (Signed)
[facility]    CSN: [id number] Arrival date & time: 01/11/22  1128      History   Chief Complaint Chief Complaint  Patient presents with   Ear Fullness    HPI Marissa Hall is a 81 y.o. female.    Ear Fullness   Here for decreased hearing due to ears being clogged with wax. No ear pain or fever or URI symptoms.  Past Medical History:  Diagnosis Date   Acute DVT (deep venous thrombosis) ([place]) 04/23/2019   Right popliteal   Alcoholism ([place])    Anxiety and depression 1964   oncologist started duloxetine 12/2016   Breast cancer ([place]) 03/14/2016   Clinical stage 2A: (triple neg): Right breast, upper inner quadrant, 03/2016.  Neoadjuvant chemo x 5 cycles,lumpectomy 4 mo later, then RT started 10/2016.  Adjuvant Xeloda [id number].  SWOG research trial pt 04/2017--pt randomized to pembrolizumab immunotherapy.  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  A1c Mar 12, 2018 was 7.0% at Dr. Shirlyn Goltz.    Epidermoid cyst of vulva    Chronic epidermoid cyst of the vulva.  Excision done 02/2019   GERD (gastroesophageal reflux disease) 2013   History of basal cell cancer    L ankle   Hyperlipidemia 1986   Hypertension 2008   2022 ->addition of hctz to lisinopril led to 63m drop in GFR. HCTZ d/cd.   Hypothyroidism 1988   Diagnosed in her 446s  Managed by Endocrinologist   Lumbar radiculopathy 04/2019   Dr. STamala Julianto get plain films of LB and hip (considering MRI due to her hx of cancer)   Nonischemic cardiomyopathy ([place]    Hx of takotsubo CM   Osteoporosis 2015   pt states "osteopenia", but then says that she  refused to take the rx med for this condition, so I suspect she had osteoporosis.   Peripheral neuropathy 2017   Patient states diabetic neuropathy in feet prior to starting chemotherapy and then worsened by chemo.    Syncope and collapse    [place]  No prodrome.  Monitor ordered and cardiology referral ordered 05/14/21   TIA (transient ischemic attack) 03/26/2011   2012: question of (HA + R eye "floaters"). CT in ED neg acute. Not admitted, no f/u testing done.  ?ocular migraine?    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   Closed fracture of proximal end of left femur, initial encounter ([place] 10/06/2021   Hyperkalemia 10/06/2021   Alcohol use 10/06/2021   Malnutrition of moderate degree 10/06/2021   Syncope and collapse 06/10/2021   Closed fracture of multiple ribs of right side 04/30/2021   Acquired hypothyroidism 02/23/2021   Type 2 diabetes mellitus with diabetic polyneuropathy, without long-term current use of insulin ([place] 02/23/2021   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   Diabetes mellitus type 2 without retinopathy ([place] 09/21/2020   Meibomian gland dysfunction (MGD) of both eyes 09/21/2020   History of COVID-19 06/23/2020   COVID-19 virus infection 05/27/2020   Piriformis syndrome of right side 05/16/2019   Lower extremity pain, right 04/23/2019  DVT (deep venous thrombosis) ([place]) 04/23/2019   Lumbar radiculopathy, right 04/02/2019   Cataracts, bilateral 07/2017   Other long term (current) drug therapy 06/14/2017   Anxiety and depression    History of breast cancer 03/02/2017   History of therapeutic radiation 03/02/2017   Breast pain, right 10/03/2016   Type 2 diabetes mellitus ([place]) 08/23/2016   Hypertension associated with diabetes ([place]) 08/23/2016   Hypothyroidism 08/23/2016   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   Port catheter in place 05/09/2016   Peripheral neuropathy 05/09/2016    Breast cancer of upper-inner quadrant of right female breast ([place]) 03/09/2016   Major depressive disorder, recurrent ([place]) 95/02/3266   Uncomplicated alcohol dependence ([place]) 02/19/2014   Osteoporosis 2015   GERD (gastroesophageal reflux disease) 2013   TIA (transient ischemic attack) 03/26/2011   Epidermoid cyst of vulva 2008   Hyperlipidemia associated with type 2 diabetes mellitus ([place]) 1986    Past Surgical History:  Procedure Laterality Date   [place]   BREAST IMPLANT REMOVAL Right 09/13/2016   Procedure: REMOVAL RIGHT BREAST IMPLANT;  Surgeon: Irene Limbo, MD;  Location: [facility];  Service: Plastics;  Laterality: Right;   BREAST LUMPECTOMY WITH RADIOACTIVE SEED AND SENTINEL LYMPH NODE BIOPSY Right 09/13/2016   Procedure: RIGHT BREAST LUMPECTOMY WITH RADIOACTIVE SEED X 2 AND SENTINEL LYMPH NODE BIOPSY;  Surgeon: Alphonsa Overall, MD;  Location: [facility];  Service: General;  Laterality: Right;   BREAST SURGERY Right 03/14/2016   Biopsy   CAPSULECTOMY Right 09/13/2016   Procedure: RIGHT CAPSULECTOMY;  Surgeon: Irene Limbo, MD;  Location: [facility];  Service: Plastics;  Laterality: Right;   CATARACT EXTRACTION, BILATERAL Bilateral 08/10/17 right eye, 08/31/17 left eye   MASS EXCISION Left 02/28/2018   Path: benign.  Procedure: EXCISIONLEFT MEDIAL THIGH MASS ERAS PATHWAY;  Surgeon: Erroll Luna, MD;  Location: [facility];  Service: General;  Laterality: Left;   PORTACATH PLACEMENT N/A 03/15/2016   Procedure: INSERTION PORT-A-CATH WITH [place];  Surgeon: Alphonsa Overall, MD;  [facility];  Service: General;  Laterality: N/A;   PORTACATH REMOVAL  07/2017   SLEEP STUDY  09/2021   NO SLEEP APNEA   surgical repair left ankle Left 2009   s/p [place]   Age 15   TOTAL HIP ARTHROPLASTY Left 10/06/2021    Procedure: TOTAL HIP ARTHROPLASTY ANTERIOR APPROACH;  Surgeon: [name], MD;  [facility];  Service: Orthopedics;  Laterality: Left;   US CAROTID DOPPLER BILATERAL ([place] HX)  01/2021   <50% bilat int carotid sten, otherwise normal.    OB History     Gravida  1   Para  1   Term      Preterm      AB      Living  1      SAB      IAB      Ectopic      Multiple      Live Births               Home Medications    Prior to Admission medications   Medication Sig Start Date End Date Taking? Authorizing Provider  neomycin-polymyxin-hydrocortisone (CORTISPORIN) OTIC solution Place 4 drops into the right ear 4 (four) times daily for 7 days. 01/11/22 01/18/22 Yes Barrett Henle, MD  atorvastatin (LIPITOR) 80 MG  tablet Take 1 tablet (80 mg total) by mouth every evening. 02/25/21   Shamleffer, Melanie Crazier, MD  carvedilol (COREG) 3.125 MG tablet Take 1 tablet (3.125 mg total) by mouth 2 (two) times daily with a meal. 11/03/21   McGowen, Adrian Blackwater, MD  cholecalciferol (VITAMIN D3) 25 MCG (1000 UNIT) tablet Take 1,000 Units by mouth daily.    [provider]  DULoxetine (CYMBALTA) 60 MG capsule Take 1 capsule (60 mg total) by mouth daily. 10/05/21   McGowen, Adrian Blackwater, MD  folic acid (FOLVITE) 1 MG tablet Take 1 tablet (1 mg total) by mouth daily. 10/14/21   Pokhrel, Corrie Mckusick, MD  metFORMIN (GLUCOPHAGE-XR) 500 MG 24 hr tablet Take 2 tablets (1,000 mg total) by mouth daily before supper. 07/20/21   Shamleffer, Melanie Crazier, MD  Multiple Vitamin (MULTIVITAMIN WITH MINERALS) TABS tablet Take 1 tablet by mouth daily. 10/14/21   Pokhrel, Corrie Mckusick, MD  OVER THE COUNTER MEDICATION Take 400 mg by mouth daily. Palmitoylethanolamide - suggested by Oncologist, study drug for neuropathy    [provider]  Propylene Glycol (SYSTANE BALANCE OP) Place 1 drop into both eyes daily as needed (for dry eyes).    [provider]  SYNTHROID 125 MCG tablet Take 1 tablet (125  mcg total) by mouth daily before breakfast. 07/22/21   Shamleffer, Melanie Crazier, MD  thiamine 100 MG tablet Take 1 tablet (100 mg total) by mouth daily. 10/14/21   Pokhrel, Corrie Mckusick, MD  traZODone (DESYREL) 50 MG tablet Take 1 tablet (50 mg total) by mouth at bedtime. 10/05/21   McGowen, Adrian Blackwater, MD  valsartan (DIOVAN) 160 MG tablet Take 1 tablet (160 mg total) by mouth daily. 11/03/21   McGowen, Adrian Blackwater, MD    Family History Family History  Problem Relation Age of Onset   Stroke Mother    Suicidality Father    Stroke Brother    Stroke Son    Sleep apnea Son     Social History Social History   Tobacco Use   Smoking status: Former    Packs/day: 1.00    Types: Cigarettes    Quit date: 03/08/1999    Years since quitting: 22.8   Smokeless tobacco: Never  Vaping Use   Vaping Use: Never used  Substance Use Topics   Alcohol use: Yes    Alcohol/week: 7.0 standard drinks of alcohol    Types: 7 Shots of liquor per week    Comment:  daily   Drug use: No     Allergies   Other, Prednisone, Amlodipine, Hydrochlorothiazide, and Penicillins   Review of Systems Review of Systems   Physical Exam Triage Vital Signs ED Triage Vitals [01/11/22 1153]  Enc Vitals Group     BP (!) 198/73     Pulse Rate 62     Resp 19     Temp 98.3 F (36.8 C)     Temp Source Oral     SpO2 98 %     Weight      Height      Head Circumference      Peak Flow      Pain Score      Pain Loc      Pain Edu?      Excl. in [place]?    No data found.  Updated Vital Signs BP (!) 187/79 (BP Location: Left Arm) Comment: the patient states she just took her bp med  Pulse 62   Temp 98.3 F (36.8 C) (Oral)  Resp 19   SpO2 98%   Visual Acuity Right Eye Distance:   Left Eye Distance:   Bilateral Distance:    Right Eye Near:   Left Eye Near:    Bilateral Near:     Physical Exam Vitals reviewed.  Constitutional:      General: She is not in acute distress.    Appearance: She is not toxic-appearing.   HENT:     Ears:     Comments: Bilaterally TM's are obscured by cerumen Skin:    Coloration: Skin is not pale.  Neurological:     Mental Status: She is alert and oriented to person, place, and time.  Psychiatric:        Behavior: Behavior normal.      UC Treatments / Results  Labs (all labs ordered are listed, but only abnormal results are displayed) Labs Reviewed - No data to display  EKG   Radiology No results found.  Procedures Procedures (including critical care time)  Medications Ordered in UC Medications - No data to display  Initial Impression / Assessment and Plan / UC Course  I have reviewed the triage vital signs and the nursing notes.  Pertinent labs & imaging results that were available during my care of the patient were reviewed by me and considered in my medical decision making (see chart for details).     After ear lavage, she feels much better.  Has some white adherent substance on the walls, and it is still a little irritated to her.  I will send in some Cortisporin for that ear therefore. Final Clinical Impressions(s) / UC Diagnoses   Final diagnoses:  Bilateral impacted cerumen  Acute otitis externa of left ear, unspecified type     Discharge Instructions      Put Cortisporin eardrops in the affected ear 4 times a day for 7 days       ED Prescriptions     Medication Sig Dispense Auth. Provider   neomycin-polymyxin-hydrocortisone (CORTISPORIN) OTIC solution Place 4 drops into the right ear 4 (four) times daily for 7 days. 10 mL Barrett Henle, MD      PDMP not reviewed this encounter.   Barrett Henle, MD 01/11/22 1215

## 2022-01-11 NOTE — Discharge Instructions (Addendum)
Put Cortisporin eardrops in the affected ear 4 times a day for 7 days

## 2022-02-09 ENCOUNTER — Other Ambulatory Visit: Payer: Self-pay

## 2022-02-09 MED ORDER — SYNTHROID 125 MCG PO TABS: 125.0000 ug | ORAL_TABLET | Freq: Every day | ORAL | 0 refills | Status: DC

## 2022-02-09 NOTE — Telephone Encounter (Signed)
Medication sent.

## 2022-04-06 ENCOUNTER — Ambulatory Visit: Payer: Medicare Other

## 2022-04-06 ENCOUNTER — Encounter: Payer: Self-pay | Admitting: Orthopaedic Surgery

## 2022-04-06 ENCOUNTER — Ambulatory Visit (INDEPENDENT_AMBULATORY_CARE_PROVIDER_SITE_OTHER): Payer: Medicare Other | Admitting: Orthopaedic Surgery

## 2022-04-06 VITALS — BP 173/77 | HR 78 | Ht 68.0 in | Wt 160.0 lb

## 2022-04-06 DIAGNOSIS — M545 Low back pain, unspecified: Secondary | ICD-10-CM | POA: Diagnosis not present

## 2022-04-06 DIAGNOSIS — M25552 Pain in left hip: Secondary | ICD-10-CM

## 2022-04-06 NOTE — Progress Notes (Signed)
Office Visit Note   Patient: Marissa Hall           Date of Birth: [birth year]           MRN: [id number] Visit Date: 04/06/2022              Requested by: Tammi Sou, MD [address] [place],  [place] [address] PCP: Tammi Sou, MD   Assessment & Plan: Visit Diagnoses:  1. Acute midline low back pain, unspecified whether sciatica present   2. Pain in left hip     Plan: With patient's history of falling we will set her up for some physical therapy for evaluation and treatment.  No foot drop.  She can follow-up with me after therapy if she is having persistent problems.  Follow-Up Instructions: No follow-ups on file.   Orders:  Orders Placed This Encounter  Procedures   XR Lumbar Spine 2-3 Views   XR HIP UNILAT W OR W/O PELVIS 2-3 VIEWS LEFT   No orders of the defined types were placed in this encounter.     Procedures: No procedures performed   Clinical Data: No additional findings.   Subjective: Chief Complaint  Patient presents with   Lower Back - Pain   Left Hip - Pain    10/06/2021 Left THA    HPI 81 year old female 6 months post left total hip arthroplasty.  Patient states she has had some lower back pain lower thoracic upper lumbar as well as lower lumbar with feelings of weakness.  She has had a history of chemotherapy with neuropathy secondary to chemotherapy.  Previous ankle fixation with plate and screws which is healed.  She states the hips is slightly uncomfortable when she crosses her legs or getting out of the car.  She has decreased sensation in both feet and ankles.  At times when she walks she has caught her toe on the carpet.  Review of Systems positive peripheral neuropathy which patient relates is from chemotherapy post breast cancer.  History of DVT, type 2 diabetes.  Positive hypertension hypothyroidism.  All other systems noncontributory HPI.   Objective: Vital Signs: BP (!) 173/77   Pulse 78   Ht '5\' 8"'$  (1.727 m)   Wt  160 lb (72.6 kg)   BMI 24.33 kg/m   Physical Exam Constitutional:      Appearance: She is well-developed.  HENT:     Head: Normocephalic.     Right Ear: External ear normal.     Left Ear: External ear normal. There is no impacted cerumen.  Eyes:     Pupils: Pupils are equal, round, and reactive to light.  Neck:     Thyroid: No thyromegaly.     Trachea: No tracheal deviation.  Cardiovascular:     Rate and Rhythm: Normal rate.  Pulmonary:     Effort: Pulmonary effort is normal.  Abdominal:     Palpations: Abdomen is soft.  Musculoskeletal:     Cervical back: No rigidity.  Skin:    General: Skin is warm and dry.  Neurological:     Mental Status: She is alert and oriented to person, place, and time.  Psychiatric:        Behavior: Behavior normal.     Ortho Exam decreased sensation both feet and ankles symmetrical stocking distribution.  She has strong EHL and strong ankle dorsiflexion both the right and left.  Specialty Comments:  No specialty comments available.  Imaging: No results found.  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   Closed fracture of proximal end of left femur, initial encounter ([place]) 10/06/2021   Hyperkalemia 10/06/2021   Alcohol use 10/06/2021   Malnutrition of moderate degree 10/06/2021   Syncope and collapse 06/10/2021   Closed fracture of multiple ribs of right side 04/30/2021   Acquired hypothyroidism 02/23/2021   Type 2 diabetes mellitus with diabetic polyneuropathy, without long-term current use of insulin ([place]) 02/23/2021   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   Diabetes mellitus type 2 without retinopathy ([place]) 09/21/2020   Meibomian gland dysfunction (MGD) of both eyes 09/21/2020   History of COVID-19 06/23/2020   COVID-19 virus infection 05/27/2020   Piriformis syndrome of right side 05/16/2019   Lower extremity pain, right 04/23/2019   DVT (deep venous thrombosis) ([place])  04/23/2019   Lumbar radiculopathy, right 04/02/2019   Cataracts, bilateral 07/2017   Other long term (current) drug therapy 06/14/2017   Anxiety and depression    History of breast cancer 03/02/2017   History of therapeutic radiation 03/02/2017   Breast pain, right 10/03/2016   Type 2 diabetes mellitus ([place]) 08/23/2016   Hypertension associated with diabetes ([place]) 08/23/2016   Hypothyroidism 08/23/2016   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   Port catheter in place 05/09/2016   Peripheral neuropathy 05/09/2016   Breast cancer of upper-inner quadrant of right female breast ([place]) 03/09/2016   Major depressive disorder, recurrent ([place]) 74/01/1447   Uncomplicated alcohol dependence ([place]) 02/19/2014   Osteoporosis 2015   GERD (gastroesophageal reflux disease) 2013   TIA (transient ischemic attack) 03/26/2011   Epidermoid cyst of vulva 2008   Hyperlipidemia associated with type 2 diabetes mellitus ([place]) 1986   Past Medical History:  Diagnosis Date   Acute DVT (deep venous thrombosis) ([place]) 04/23/2019   Right popliteal   Alcoholism ([place])    Anxiety and depression 1964   oncologist started duloxetine 12/2016   Breast cancer ([place]) 03/14/2016   Clinical stage 2A: (triple neg): Right breast, upper inner quadrant, 03/2016.  Neoadjuvant chemo x 5 cycles,lumpectomy 4 mo later, then RT started 10/2016.  Adjuvant Xeloda [id number].  SWOG research trial pt 04/2017--pt randomized to pembrolizumab immunotherapy.  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  A1c Mar 12, 2018 was 7.0% at Dr. Shirlyn Goltz.    Epidermoid cyst of vulva     Chronic epidermoid cyst of the vulva.  Excision done 02/2019   GERD (gastroesophageal reflux disease) 2013   History of basal cell cancer    L ankle   Hyperlipidemia 1986   Hypertension 2008   2022 ->addition of hctz to lisinopril led to 78m drop in GFR. HCTZ d/cd.   Hypothyroidism 1988   Diagnosed in her 447s  Managed by Endocrinologist   Lumbar radiculopathy 04/2019   Dr. STamala Julianto get plain films of LB and hip (considering MRI due to her hx of cancer)   Nonischemic cardiomyopathy ([place]    Hx of takotsubo CM   Osteoporosis 2015   pt states "osteopenia", but then says that she refused to take the rx med for this condition,  so I suspect she had osteoporosis.   Peripheral neuropathy 2017   Patient states diabetic neuropathy in feet prior to starting chemotherapy and then worsened by chemo.    Syncope and collapse    05/2021 while in [place].  No prodrome.  Monitor ordered and cardiology referral ordered 05/14/21   TIA (transient ischemic attack) 03/26/2011   2012: question of (HA + R eye "floaters"). CT in ED neg acute. Not admitted, no f/u testing done.  ?ocular migraine?    Family History  Problem Relation Age of Onset   Stroke Mother    Suicidality Father    Stroke Brother    Stroke Son    Sleep apnea Son     Past Surgical History:  Procedure Laterality Date   ABDOMINAL HYSTERECTOMY  1972   APPENDECTOMY  1972   BREAST ENHANCEMENT SURGERY  1982   BREAST IMPLANT REMOVAL Right 09/13/2016   Procedure: REMOVAL RIGHT BREAST IMPLANT;  Surgeon: Irene Limbo, MD;  Location: [facility];  Service: Plastics;  Laterality: Right;   BREAST LUMPECTOMY WITH RADIOACTIVE SEED AND SENTINEL LYMPH NODE BIOPSY Right 09/13/2016   Procedure: RIGHT BREAST LUMPECTOMY WITH RADIOACTIVE SEED X 2 AND SENTINEL LYMPH NODE BIOPSY;  Surgeon: Alphonsa Overall, MD;  Location: [facility];  Service: General;  Laterality: Right;   BREAST SURGERY Right 03/14/2016   Biopsy    CAPSULECTOMY Right 09/13/2016   Procedure: RIGHT CAPSULECTOMY;  Surgeon: Irene Limbo, MD;  Location: [facility];  Service: Plastics;  Laterality: Right;   CATARACT EXTRACTION, BILATERAL Bilateral 08/10/17 right eye, 08/31/17 left eye   MASS EXCISION Left 02/28/2018   Path: benign.  Procedure: EXCISIONLEFT MEDIAL THIGH MASS ERAS PATHWAY;  Surgeon: Erroll Luna, MD;  Location: [facility];  Service: General;  Laterality: Left;   PORTACATH PLACEMENT N/A 03/15/2016   Procedure: INSERTION PORT-A-CATH WITH [place];  Surgeon: Alphonsa Overall, MD;  [facility];  Service: General;  Laterality: N/A;   PORTACATH REMOVAL  07/2017   SLEEP STUDY  09/2021   NO SLEEP APNEA   surgical repair left ankle Left 2009   s/p [place]   Age 39   TOTAL HIP ARTHROPLASTY Left 10/06/2021   Procedure: TOTAL HIP ARTHROPLASTY ANTERIOR APPROACH;  Surgeon: [name], MD;  [facility];  Service: Orthopedics;  Laterality: Left;   US CAROTID DOPPLER BILATERAL ([place] HX)  01/2021   <50% bilat int carotid sten, otherwise normal.   Social History   Occupational History   Not on file  Tobacco Use   Smoking status: Former    Packs/day: 1.00    Types: Cigarettes    Quit date: 03/08/1999    Years since quitting: 23.0   Smokeless tobacco: Never  Vaping Use   Vaping Use: Never used  Substance and Sexual Activity   Alcohol use: Yes    Alcohol/week: 7.0 standard drinks of alcohol    Types: 7 Shots of liquor per week    Comment:  daily   Drug use: No   Sexual activity: Not Currently    Partners: Male    Birth control/protection: Surgical    Comment: 1st intercourse- 19, partners- 10+, current partner- 8 yrs, hysterectomy

## 2022-04-14 ENCOUNTER — Other Ambulatory Visit: Payer: Self-pay

## 2022-04-14 DIAGNOSIS — E785 Hyperlipidemia, unspecified: Secondary | ICD-10-CM

## 2022-04-14 MED ORDER — ATORVASTATIN CALCIUM 80 MG PO TABS: 80.0000 mg | ORAL_TABLET | Freq: Every evening | ORAL | 3 refills | Status: DC

## 2022-04-26 ENCOUNTER — Other Ambulatory Visit: Payer: Self-pay | Admitting: *Deleted

## 2022-04-26 DIAGNOSIS — Z171 Estrogen receptor negative status [ER-]: Secondary | ICD-10-CM

## 2022-04-26 NOTE — Therapy (Incomplete)
OUTPATIENT PHYSICAL THERAPY THORACOLUMBAR and LOWER EXTREMITY EVALUATION   Patient Name: Marissa Hall MRN: [id number] DOB:[birth year], 81 y.o., female Today's Date: 04/26/2022  END OF SESSION:   Past Medical History:  Diagnosis Date   Acute DVT (deep venous thrombosis) ([place]) 04/23/2019   Right popliteal   Alcoholism ([place])    Anxiety and depression 1964   oncologist started duloxetine 12/2016   Breast cancer ([place]) 03/14/2016   Clinical stage 2A: (triple neg): Right breast, upper inner quadrant, 03/2016.  Neoadjuvant chemo x 5 cycles,lumpectomy 4 mo later, then RT started 10/2016.  Adjuvant Xeloda [id number].  SWOG research trial pt 04/2017--pt randomized to pembrolizumab immunotherapy.  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  A1c Mar 12, 2018 was 7.0% at Dr. Shirlyn Goltz.    Epidermoid cyst of vulva    Chronic epidermoid cyst of the vulva.  Excision done 02/2019   GERD (gastroesophageal reflux disease) 2013   History of basal cell cancer    L ankle   Hyperlipidemia 1986   Hypertension 2008   2022 ->addition of hctz to lisinopril led to 9m drop in GFR. HCTZ d/cd.   Hypothyroidism 1988   Diagnosed in her 415s  Managed by Endocrinologist   Lumbar radiculopathy 04/2019   Dr. STamala Julianto get plain films of LB and hip (considering MRI due to her hx of cancer)   Nonischemic cardiomyopathy ([place]    Hx of takotsubo CM   Osteoporosis 2015   pt states "osteopenia", but then says that she refused to take the rx med for this condition, so I suspect she had osteoporosis.   Peripheral neuropathy 2017   Patient states diabetic  neuropathy in feet prior to starting chemotherapy and then worsened by chemo.    Syncope and collapse    [place]  No prodrome.  Monitor ordered and cardiology referral ordered 05/14/21   TIA (transient ischemic attack) 03/26/2011   2012: question of (HA + R eye "floaters"). CT in ED neg acute. Not admitted, no f/u testing done.  ?ocular migraine?   Past Surgical History:  Procedure Laterality Date   ABDOMINAL HYSTERECTOMY  1972   APPENDECTOMY  1972   BREAST ENHANCEMENT SURGERY  1982   BREAST IMPLANT REMOVAL Right 09/13/2016   Procedure: REMOVAL RIGHT BREAST IMPLANT;  Surgeon: BIrene Limbo MD;  [facility]  Service: Plastics;  Laterality: Right;   BREAST LUMPECTOMY WITH RADIOACTIVE SEED AND SENTINEL LYMPH NODE BIOPSY Right 09/13/2016   Procedure: RIGHT BREAST LUMPECTOMY WITH RADIOACTIVE SEED X 2 AND SENTINEL LYMPH NODE BIOPSY;  Surgeon: DAlphonsa Overall MD;  [facility]  Service: General;  Laterality: Right;   BREAST SURGERY Right 03/14/2016   Biopsy   CAPSULECTOMY Right 09/13/2016   Procedure: RIGHT CAPSULECTOMY;  Surgeon: BIrene Limbo MD;  Location[facility]  Service: Plastics;  Laterality: Right;   CATARACT EXTRACTION, BILATERAL Bilateral 08/10/17 right eye, 08/31/17 left eye   MASS EXCISION Left 02/28/2018   Path: benign.  Procedure: EXCISIONLEFT MEDIAL THIGH MASS ERAS PATHWAY;  Surgeon: CErroll Luna MD;  [facility]  Service: General;  Laterality: Left;   PORTACATH PLACEMENT N/A 03/15/2016   Procedure: INSERTION PORT-A-CATH WITH [place];  Surgeon: Alphonsa Overall, MD;  Location: [facility];  Service: General;  Laterality: N/A;   PORTACATH REMOVAL  07/2017   SLEEP STUDY  09/2021   NO SLEEP APNEA   surgical repair left ankle Left 2009   s/p [place]   Age 41   TOTAL HIP ARTHROPLASTY Left 10/06/2021   Procedure: TOTAL HIP ARTHROPLASTY ANTERIOR  APPROACH;  Surgeon: [name], MD;  Location: [facility];  Service: Orthopedics;  Laterality: Left;   US CAROTID DOPPLER BILATERAL ([place] [facility])  01/2021   <50% bilat int carotid sten, otherwise normal.   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   Closed fracture of proximal end of left femur, initial encounter ([place]) 10/06/2021   Hyperkalemia 10/06/2021   Alcohol use 10/06/2021   Malnutrition of moderate degree 10/06/2021   Syncope and collapse 06/10/2021   Closed fracture of multiple ribs of right side 04/30/2021   Acquired hypothyroidism 02/23/2021   Type 2 diabetes mellitus with diabetic polyneuropathy, without long-term current use of insulin ([place]) 02/23/2021   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   Diabetes mellitus type 2 without retinopathy ([place]) 09/21/2020   Meibomian gland dysfunction (MGD) of both eyes 09/21/2020   History of COVID-19 06/23/2020   COVID-19 virus infection 05/27/2020   Piriformis syndrome of right side 05/16/2019   Lower extremity pain, right 04/23/2019   DVT (deep venous thrombosis) ([place]) 04/23/2019   Lumbar radiculopathy, right 04/02/2019   Cataracts, bilateral 07/2017   Other long term (current) drug therapy 06/14/2017   Anxiety and depression    History of breast cancer 03/02/2017   History of therapeutic radiation 03/02/2017   Breast pain, right 10/03/2016   Type 2 diabetes mellitus ([place]) 08/23/2016   Hypertension associated with diabetes ([place]) 08/23/2016   Hypothyroidism 08/23/2016   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   Port catheter in place 05/09/2016   Peripheral neuropathy 05/09/2016   Breast cancer of upper-inner quadrant of right female breast ([place]) 03/09/2016   Major depressive disorder, recurrent ([place]) 84/16/6063   Uncomplicated alcohol dependence ([place]) 02/19/2014   Osteoporosis 2015   GERD (gastroesophageal reflux disease) 2013   TIA (transient ischemic attack) 03/26/2011    Epidermoid cyst of vulva 2008   Hyperlipidemia associated with type 2 diabetes mellitus ([place]) 1986    PCP: Tammi Sou, MD   REFERRING PROVIDER: [name], MD   REFERRING DIAG:  M54.50 (ICD-10-CM) - Acute midline low back pain, unspecified whether sciatica present  M25.552 (ICD-10-CM) - Pain in left hip   Eval and treat Falling with peripheral neuropathy secondary to chemo Catching toes on carpet  THERAPY DIAG:  No diagnosis found.  RATIONALE FOR EVALUATION AND TREATMENT: Rehabilitation  ONSET DATE: ***  NEXT MD VISIT: ***   SUBJECTIVE:  SUBJECTIVE STATEMENT: ***  PAIN: Are you having pain? {OPRCPAIN:27236}  PERTINENT HISTORY:  ***L anterior approach THA s/p fall with femur fracture 10/06/21, falls resulting in multiple R rib fractures (04/2021),  osteoporosis, ORIF L ankle fracture 2009, syncope, HTN, DM-II, diabetic neuropathy, hypothyroidism, DVT, breast cancer ([place]) s/p lumpectomy and chemo, chemo induced peripheral neuropathy, piriformis syndrome,TIA, GERD, anxiety and depression, non ischemic cardiomyopathy (Hx of takotsubo CM), ETOH, malnutrition   PRECAUTIONS: {Therapy precautions:24002}  WEIGHT BEARING RESTRICTIONS: {Yes ***/No:24003}  FALLS:  Has patient fallen in last 6 months? {fallsyesno:27318}  LIVING ENVIRONMENT: Lives with: {OPRC lives with:25569::"lives with their family"}  Alone Lives in: {Lives in:25570}  House Stairs: {opstairs:27293}  One level - 3 steps to enter Has following equipment at home: {Assistive devices:23999}  OCCUPATION: ***  PLOF: {PLOF:24004}  PATIENT GOALS: ***   OBJECTIVE:   DIAGNOSTIC FINDINGS:  ***04/06/22 - Lumbar and R hip x-rays - results not available  PATIENT SURVEYS:  {rehab surveys:24030}  SCREENING FOR  RED FLAGS: Bowel or bladder incontinence: {Yes/No:304960894} Spinal tumors: {Yes/No:304960894} Cauda equina syndrome: {Yes/No:304960894} Compression fracture: {Yes/No:304960894} Abdominal aneurysm: {Yes/No:304960894}  COGNITION:  Overall cognitive status: {cognition:24006}    SENSATION: {sensation:27233}  MUSCLE LENGTH: Hamstrings: Right *** deg; Left *** deg Thomas test: Right *** deg; Left *** deg Hamstrings: *** ITB: *** Piriformis: *** Hip flexors: *** Quads: *** Heelcord: ***  POSTURE:  {posture:25561}  PALPATION: ***  LUMBAR ROM:   {AROM/PROM:27142}  A/PROM  eval  Flexion   Extension   Right lateral flexion   Left lateral flexion   Right rotation   Left rotation   (Blank rows = not tested)  LOWER EXTREMITY ROM:     {AROM/PROM:27142}  Right eval Left eval  Hip flexion    Hip extension    Hip abduction    Hip adduction    Hip internal rotation    Hip external rotation    Knee flexion    Knee extension    Ankle dorsiflexion    Ankle plantarflexion    Ankle inversion    Ankle eversion    (Blank rows = not tested)  LOWER EXTREMITY MMT:    MMT Right eval Left eval  Hip flexion    Hip extension    Hip abduction    Hip adduction    Hip internal rotation    Hip external rotation    Knee flexion    Knee extension    Ankle dorsiflexion    Ankle plantarflexion    Ankle inversion    Ankle eversion     (Blank rows = not tested)  LUMBAR SPECIAL TESTS:  {lumbar special test:25242}  FUNCTIONAL TESTS:  {Functional tests:24029}  GAIT: Distance walked: *** Assistive device utilized: {Assistive devices:23999} Level of assistance: {Levels of assistance:24026} Gait pattern: {gait characteristics:25376} Comments: ***   TODAY'S TREATMENT:  ***   PATIENT EDUCATION:  Education details: *** Person educated: {Person educated:25204} Education method: {Education Method:25205} Education comprehension: {Education Comprehension:25206}  HOME  EXERCISE PROGRAM: ***   ASSESSMENT:  CLINICAL IMPRESSION: Adilen Pavelko is a 81 y.o. female  who was seen today for physical therapy evaluation and treatment for ***.    OBJECTIVE IMPAIRMENTS: {opptimpairments:25111}.   ACTIVITY LIMITATIONS: {activitylimitations:27494}  PARTICIPATION LIMITATIONS: {participationrestrictions:25113}  PERSONAL FACTORS: {Personal factors:25162} are also affecting patient's functional outcome.   REHAB POTENTIAL: {rehabpotential:25112}  CLINICAL DECISION MAKING: {clinical decision making:25114}  EVALUATION COMPLEXITY: {Evaluation complexity:25115}   GOALS: Goals reviewed with patient? {yes/no:20286}  SHORT TERM GOALS: Target date: ***  Patient will be independent with  initial HEP to improve outcomes and carryover.  Baseline: *** Goal status: {GOALSTATUS:25110}  2.  Patient will report centralization of radicular symptoms.  Baseline: *** Goal status: {GOALSTATUS:25110}  3.  *** Baseline: *** Goal status: {GOALSTATUS:25110}   LONG TERM GOALS: Target date: ***  Patient will be independent with ongoing/advanced HEP for self-management at home.  Baseline: *** Goal status: {GOALSTATUS:25110}  2.  Patient will report 75% improvement in low back pain to improve QOL.  Baseline: *** Goal status: {GOALSTATUS:25110}  3.  Patient to demonstrate ability to achieve and maintain good spinal alignment/posturing and body mechanics needed for daily activities. Baseline: *** Goal status: {GOALSTATUS:25110}  4.  Patient will demonstrate full pain free lumbar ROM to perform ADLs.   Baseline: *** Goal status: {GOALSTATUS:25110}  5.  Patient will demonstrate improved *** strength to >/= ***/5 for improved stability and ease of mobility . Baseline: *** Goal status: {GOALSTATUS:25110}  6.  Patient will report *** on lumbar FOTO to demonstrate improved functional ability.  Baseline: *** Goal status: {GOALSTATUS:25110}   7. Patient will report  </= ***% on Modified Oswestry to demonstrate improved functional ability with decreased pain interference. Baseline: *** Goal status: {GOALSTATUS:25110}  8.  Patient will tolerate *** min of (standing/sitting/walking) w/o increased pain to allow for *** improved mobility and activity tolerance. Baseline: *** Goal status: {GOALSTATUS:25110}  9.  *** Baseline: *** Goal status: {GOALSTATUS:25110}   PLAN:  PT FREQUENCY: {rehab frequency:25116}  PT DURATION: {rehab duration:25117}  PLANNED INTERVENTIONS: {rehab planned interventions:25118::"Therapeutic exercises","Therapeutic activity","Neuromuscular re-education","Balance training","Gait training","Patient/Family education","Self Care","Joint mobilization"}  PLAN FOR NEXT SESSION: ***   Percival Spanish, PT 04/26/2022, 2:34 PM

## 2022-04-27 NOTE — Progress Notes (Signed)
 Patient Care Team: McGowen, Philip H, MD as PCP - General (Family Medicine) Newman, David, MD as Consulting Physician (General Surgery) Gudena, Vinay, MD as Consulting Physician (Hematology and Oncology) Moody, John, MD as Consulting Physician (Radiation Oncology) Thimmappa, Brinda, MD as Consulting Physician (Plastic Surgery) Causey, Lindsey Cornetto, NP as Nurse Practitioner (Hematology and Oncology) Lavoie, Marie-Lyne, MD as Consulting Physician (Obstetrics and Gynecology) Tsamis, Katherine Agatha, MD as Referring Physician (Ophthalmology) Shamleffer, Ibtehal Jaralla, MD as Consulting Physician (Endocrinology)  DIAGNOSIS:  Encounter Diagnosis  Name Primary?   Malignant neoplasm of upper-inner quadrant of right breast in female, estrogen receptor negative (HCC) Yes    SUMMARY OF ONCOLOGIC HISTORY: Oncology History  Breast cancer of upper-inner quadrant of right female breast (HCC)  02/29/2016 Initial Diagnosis   Right breast palpable mass (with silicone implants 1982), 3.5 cm on MRI, additional 3 cm anterior linear enhancement? 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 Ki-67 60%, T2 N0 stage 2A clinical stage   03/14/2016 Procedure   Right breast biopsy upper inner quadrant: IDC grade 3   03/28/2016 -  Neo-Adjuvant Chemotherapy   Neoadjuvant chemotherapy with dose dense Adriamycin and Cytoxan followed by Abraxane weekly 5 ( patient is diabetic and cannot take steroids)   07/15/2016 Breast MRI   Right breast: Spiculated mass 1.5 cm significantly smaller compared to prior,NME previously seen is not noted, no abnormal lymph nodes   09/13/2016 Surgery   Removal of the silicone implant due to intracapsular rupture and capsulectomy (Dr.Thimappa)   09/13/2016 Surgery   Right lumpectomy: IDC grade 3, 2 foci, 2 cm and 1.1 cm, 0/3 lymph nodes negative margins negative, ER 0%, PR 0%, HER-2 negative ratio 1.02, Ki-67 60%, RCB-II; ypT2ypN0 Stage 2A    10/20/2016 - 12/06/2016 Radiation  Therapy   Adjuvant radiation therapy   12/30/2016 - 04/05/2017 Chemotherapy   Xeloda 1000 mg by mouth twice a day adjuvant therapy x 4 cycles    05/03/2017 - 05/24/2017 Chemotherapy   SWOG S 1418 Pembrolizumab on clinical trial stopped after 2 doses by patient preference (not due to toxicities .)     CHIEF COMPLIANT: Follow-up of triple negative breast cancer.    INTERVAL HISTORY: Marissa Hall is a 81 y.o. with above-mentioned history of right breast cancer treated with neoadjuvant chemotherapy, lumpectomy, radiation, and is currently on surveillance. 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 She presents to the clinic today for follow-up. She states that she has bee doing good. She says sometimes she is a little dizzy. Sometimes she feels like she is going to fall. She denies losing cautious. Neuropathy has gotten better with wearing socks.   ALLERGIES:  is allergic to other, prednisone, amlodipine, hydrochlorothiazide, and penicillins.  MEDICATIONS:  Current Outpatient Medications  Medication Sig Dispense Refill   atorvastatin (LIPITOR) 80 MG tablet Take 1 tablet (80 mg total) by mouth every evening. 90 tablet 3   carvedilol (COREG) 3.125 MG tablet Take 1 tablet (3.125 mg total) by mouth 2 (two) times daily with a meal. 180 tablet 1   cholecalciferol (VITAMIN D3) 25 MCG (1000 UNIT) tablet Take 1,000 Units by mouth daily.     DULoxetine (CYMBALTA) 60 MG capsule Take 1 capsule (60 mg total) by mouth daily. 90 capsule 1   folic acid (FOLVITE) 1 MG tablet Take 1 tablet (1 mg total) by mouth daily.     metFORMIN (GLUCOPHAGE-XR) 500 MG 24 hr tablet Take 2 tablets (1,000 mg total) by mouth daily before supper. 180 tablet   3   Multiple Vitamin (MULTIVITAMIN WITH MINERALS) TABS tablet Take 1 tablet by mouth daily.     OVER THE COUNTER MEDICATION Take 400 mg by mouth daily. Palmitoylethanolamide - suggested by Oncologist, study drug for neuropathy     Propylene Glycol (SYSTANE  BALANCE OP) Place 1 drop into both eyes daily as needed (for dry eyes).     SYNTHROID 125 MCG tablet Take 1 tablet (125 mcg total) by mouth daily before breakfast. 90 tablet 0   thiamine 100 MG tablet Take 1 tablet (100 mg total) by mouth daily.     traZODone (DESYREL) 50 MG tablet Take 1 tablet (50 mg total) by mouth at bedtime. 90 tablet 1   valsartan (DIOVAN) 160 MG tablet Take 1 tablet (160 mg total) by mouth daily. 90 tablet 1   No current facility-administered medications for this visit.    PHYSICAL EXAMINATION: ECOG PERFORMANCE STATUS: 1 - Symptomatic but completely ambulatory  Vitals:   05/02/22 1137  BP: (!) 169/62  Pulse: 77  Resp: 18  Temp: 97.8 F (36.6 C)  SpO2: 97%   Filed Weights   05/02/22 1137  Weight: 157 lb 6.4 oz (71.4 kg)      LABORATORY DATA:  I have reviewed the data as listed    Latest Ref Rng & Units 10/29/2021   11:21 AM 10/12/2021    5:01 AM 10/10/2021    5:46 AM  CMP  Glucose 70 - 99 mg/dL 276  196  208   BUN 8 - 23 mg/dL _0 Creatinine 0.44 - 1.00 mg/dL 0.87  0.75  0.74   Sodium 135 - 145 mmol/L 139  137  134   Potassium 3.5 - 5.1 mmol/L 4.6  4.7  4.7   Chloride 98 - 111 mmol/L 103  98  97   CO2 22 - 32 mmol/L _1 Calcium 8.9 - 10.3 mg/dL 10.2  9.3  9.1   Total Protein 6.5 - 8.1 g/dL 7.6     Total Bilirubin 0.3 - 1.2 mg/dL 1.5     Alkaline Phos 38 - 126 U/L 79     AST 15 - 41 U/L 18     ALT 0 - 44 U/L 25       Lab Results  Component Value Date   WBC 4.2 05/02/2022   HGB 12.9 05/02/2022   HCT 37.7 05/02/2022   MCV 96.2 05/02/2022   PLT 171 05/02/2022   NEUTROABS 2.4 05/02/2022    ASSESSMENT & PLAN:  Breast cancer of upper-inner quadrant of right female breast ([place]) 02/29/2016: Right breast palpable mass (with silicone implants 5035), 3.5 cm on MRI, additional 3 cm anterior linear enhancement (biopsy 03/14/2016 IDC grade 3); 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    T2 N0 stage 2A clinical stage   Treatment  summary: 1. Neoadjuvant chemotherapy with dose dense Adriamycin and Cytoxan 4 followed by Abraxane weekly 5  (stopped early due to neuropathy) 2. 09/13/2016: Right lumpectomy: IDC grade 3, 2 foci, 2 cm and 1.1 cm, 0/3 lymph nodes negative margins negative, ER 0%, PR 0%, HER-2 negative ratio 1.02, Ki-67 60%, RCB-II; ypT2ypN0 Stage 2A (with plastic surgery removing the ruptured implant) 3. Followed by radiation therapy completed 12/06/2016 4. Adjuvant Xeloda 1000 mg by mouth twice a day 2 weeks on one week off 12/30/2016- Oct 2018 5. SWOG 1418 clinical trial Pembrolizumab cycle 1 given 05/03/2017 (patient decided to discontinue clinical trial) -----------------------------------------------------------------------------------------------------------------------------------------  Surveillance: 1.  Breast exam 10/29/2021: Benign, no palpable lumps or nodules of concern. 2. mammogram 06/08/2021 at [facility]: No evidence of malignancy.  Breast density category A     Chronic fatigue Chemo-induced peripheral neuropathy: Patient rates it as 5 out of 10 Hospitalization 10/05/2021-10/13/2021: Fracture of the proximal end of left femur: Total hip arthroplasty on 10/06/2021  History of syncope and collapse: No further episodes.  She does feel occasionally dizzy.  She has multiple animals at home including goats and chickens etc. she stays busy taking care of them as well as working a full-time job.  Return to clinic in 1 year    No orders of the defined types were placed in this encounter.  The patient has a good understanding of the overall plan. she agrees with it. she will call with any problems that may develop before the next visit here. Total time spent: 30 mins including face to face time and time spent for planning, charting and co-ordination of care   Viinay K Gudena, MD 05/02/22    I Deritra, Mcnairy am scribing for Dr. Gudena  I have reviewed the above documentation for accuracy and  completeness, and I agree with the above.   

## 2022-05-02 ENCOUNTER — Encounter: Payer: Self-pay | Admitting: *Deleted

## 2022-05-02 ENCOUNTER — Inpatient Hospital Stay: Payer: Medicare Other

## 2022-05-02 ENCOUNTER — Inpatient Hospital Stay: Payer: Medicare Other | Attending: Hematology and Oncology | Admitting: Hematology and Oncology

## 2022-05-02 VITALS — BP 169/62 | HR 77 | Temp 97.8°F | Resp 18 | Ht 68.0 in | Wt 157.4 lb

## 2022-05-02 DIAGNOSIS — C50211 Malignant neoplasm of upper-inner quadrant of right female breast: Secondary | ICD-10-CM | POA: Diagnosis not present

## 2022-05-02 DIAGNOSIS — G620 Drug-induced polyneuropathy: Secondary | ICD-10-CM | POA: Insufficient documentation

## 2022-05-02 DIAGNOSIS — R5382 Chronic fatigue, unspecified: Secondary | ICD-10-CM | POA: Insufficient documentation

## 2022-05-02 DIAGNOSIS — Z171 Estrogen receptor negative status [ER-]: Secondary | ICD-10-CM

## 2022-05-02 DIAGNOSIS — Z006 Encounter for examination for normal comparison and control in clinical research program: Secondary | ICD-10-CM

## 2022-05-02 LAB — CBC WITH DIFFERENTIAL (CANCER CENTER ONLY)
Abs Immature Granulocytes: 0 10*3/uL (ref 0.00–0.07)
Basophils Absolute: 0.1 10*3/uL (ref 0.0–0.1)
Basophils Relative: 1 %
Eosinophils Absolute: 0.1 10*3/uL (ref 0.0–0.5)
Eosinophils Relative: 2 %
HCT: 37.7 % (ref 36.0–46.0)
Hemoglobin: 12.9 g/dL (ref 12.0–15.0)
Immature Granulocytes: 0 %
Lymphocytes Relative: 31 %
Lymphs Abs: 1.3 10*3/uL (ref 0.7–4.0)
MCH: 32.9 pg (ref 26.0–34.0)
MCHC: 34.2 g/dL (ref 30.0–36.0)
MCV: 96.2 fL (ref 80.0–100.0)
Monocytes Absolute: 0.3 10*3/uL (ref 0.1–1.0)
Monocytes Relative: 8 %
Neutro Abs: 2.4 10*3/uL (ref 1.7–7.7)
Neutrophils Relative %: 58 %
Platelet Count: 171 10*3/uL (ref 150–400)
RBC: 3.92 MIL/uL (ref 3.87–5.11)
RDW: 13 % (ref 11.5–15.5)
WBC Count: 4.2 10*3/uL (ref 4.0–10.5)
nRBC: 0 % (ref 0.0–0.2)

## 2022-05-02 LAB — CMP (CANCER CENTER ONLY)
ALT: 29 U/L (ref 0–44)
AST: 22 U/L (ref 15–41)
Albumin: 4.8 g/dL (ref 3.5–5.0)
Alkaline Phosphatase: 70 U/L (ref 38–126)
Anion gap: 8 (ref 5–15)
BUN: 24 mg/dL — ABNORMAL HIGH (ref 8–23)
CO2: 28 mmol/L (ref 22–32)
Calcium: 10 mg/dL (ref 8.9–10.3)
Chloride: 99 mmol/L (ref 98–111)
Creatinine: 1 mg/dL (ref 0.44–1.00)
GFR, Estimated: 57 mL/min — ABNORMAL LOW (ref >60)
Glucose, Bld: 275 mg/dL — ABNORMAL HIGH (ref 70–99)
Potassium: 5.3 mmol/L — ABNORMAL HIGH (ref 3.5–5.1)
Sodium: 135 mmol/L (ref 135–145)
Total Bilirubin: 1.8 mg/dL — ABNORMAL HIGH (ref 0.3–1.2)
Total Protein: 7.4 g/dL (ref 6.5–8.1)

## 2022-05-02 NOTE — Research (Signed)
60 Months Follow Up Visit for T6226 Clinical Trial:  Patient into clinic by herself today to see Dr. Lindi Adie for 60 month study visit.   PROs; Provided to patient prior to lab and MD appointments.  Collected and checked for accuracy and completion.  Labs; Completed per protocol.  H&P, PS, Toxicity and Recurrence assessment;  Completed by Dr. Lindi Adie. See MD encounter note from today.  SAEs; Patient has not had any serious adverse events since last study visit.  Plan; Last mammogram was done on 06/03/21. Reminded patient to call [facility] and schedule yearly screening mammogram for the end of December/ beginning of January.  Patient was thanked for her ongoing participation in this study.  Study visits will be yearly now since she past the 5 year mark on study. Her next study visit is due 05/02/2023 (+/- 4 weeks) and will be scheduled.  Asked patient to call research nurse if any questions or problems prior to next study visit.  She verbalized understanding.  Foye Spurling, BSN, RN Clinical Research Nurse 05/02/2022   Addendum Phone call. CMP resulted after patient left the clinic.  Dr. Lindi Adie reviewed and he said the elevated potassium, BUN and bilirubin are probably related to uncontrolled blood sugars and instructs for patient to follow up with PCP or endocrinologist to manage sugars better and monitor labs.  Patient verbalized understanding and states she will follow up with her endocrinologist and PCP soon.   Foye Spurling, BSN, RN, [place] Nurse II [phone] 05/02/2022 4:04 PM

## 2022-05-02 NOTE — Assessment & Plan Note (Addendum)
02/29/2016: Right breast palpable mass (with silicone implants 2620), 3.5 cm on MRI, additional 3 cm anterior linear enhancement (biopsy 03/14/2016 IDC grade 3); 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    T2 N0 stage 2A clinical stage   Treatment summary: 1. Neoadjuvant chemotherapy with dose dense Adriamycin and Cytoxan 4 followed by Abraxane weekly 5  (stopped early due to neuropathy) 2. 09/13/2016: Right lumpectomy: IDC grade 3, 2 foci, 2 cm and 1.1 cm, 0/3 lymph nodes negative margins negative, ER 0%, PR 0%, HER-2 negative ratio 1.02, Ki-67 60%, RCB-II; ypT2ypN0 Stage 2A (with plastic surgery removing the ruptured implant) 3. Followed by radiation therapy completed 12/06/2016 4. Adjuvant Xeloda 1000 mg by mouth twice a day 2 weeks on one week off 12/30/2016- Oct 2018 5. SWOG 1418 clinical trial Pembrolizumab cycle 1 given 05/03/2017 (patient decided to discontinue clinical trial) ----------------------------------------------------------------------------------------------------------------------------------------- Surveillance: 1.  Breast exam 10/29/2021: Benign, no palpable lumps or nodules of concern. 2. mammogram [facility]: No evidence of malignancy.  Breast density category A     Chronic fatigue Chemo-induced peripheral neuropathy: Patient rates it as 5 out of 10 Hospitalization 10/05/2021-10/13/2021: Fracture of the proximal end of left femur: Total hip arthroplasty on 10/06/2021  History of syncope and collapse: No further episodes.  She does feel occasionally dizzy.  She has multiple animals at home including goats and chickens etc. she stays busy taking care of them as well as working a full-time job.  Return to clinic in 1 year

## 2022-05-04 ENCOUNTER — Ambulatory Visit: Payer: Medicare Other | Admitting: Physical Therapy

## 2022-05-06 ENCOUNTER — Ambulatory Visit (INDEPENDENT_AMBULATORY_CARE_PROVIDER_SITE_OTHER): Payer: Medicare Other | Admitting: Family Medicine

## 2022-05-06 ENCOUNTER — Encounter: Payer: Self-pay | Admitting: Family Medicine

## 2022-05-06 VITALS — BP 177/71 | HR 57 | Temp 98.1°F | Wt 158.0 lb

## 2022-05-06 DIAGNOSIS — Z23 Encounter for immunization: Secondary | ICD-10-CM

## 2022-05-06 DIAGNOSIS — L820 Inflamed seborrheic keratosis: Secondary | ICD-10-CM

## 2022-05-06 DIAGNOSIS — L989 Disorder of the skin and subcutaneous tissue, unspecified: Secondary | ICD-10-CM

## 2022-05-06 DIAGNOSIS — C44622 Squamous cell carcinoma of skin of right upper limb, including shoulder: Secondary | ICD-10-CM | POA: Diagnosis not present

## 2022-05-06 NOTE — Progress Notes (Signed)
OFFICE VISIT  05/06/2022  CC:  Chief Complaint  Patient presents with   Cyst    Right arm/ requesting removal   Patient is a 81 y.o. female who presents for knot on arm.  HPI: Approximately 1 month history of gradually enlarging pink nodule on right forearm. A little uncomfortable to touch. No surrounding erythema. She has felt fine.  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  Past Medical History:  Diagnosis Date   Acute DVT (deep venous thrombosis) ([place]) 04/23/2019   Right popliteal   Alcoholism ([place])    Anxiety and depression 1964   oncologist started duloxetine 12/2016   Breast cancer ([place]) 03/14/2016   Clinical stage 2A: (triple neg): Right breast, upper inner quadrant, 03/2016.  Neoadjuvant chemo x 5 cycles,lumpectomy 4 mo later, then RT started 10/2016.  Adjuvant Xeloda [id number].  SWOG research trial pt 04/2017--pt randomized to pembrolizumab immunotherapy.  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  A1c Mar 12, 2018 was 7.0% at Dr. Shirlyn Goltz.    Epidermoid cyst of vulva    Chronic epidermoid cyst of the vulva.  Excision done 02/2019   GERD (gastroesophageal reflux disease) 2013   History of basal cell cancer    L ankle   Hyperlipidemia 1986   Hypertension 2008   2022 ->addition of hctz to lisinopril led to 12m drop in GFR. HCTZ d/cd.   Hypothyroidism 1988   Diagnosed in her 413s  Managed by Endocrinologist   Lumbar radiculopathy 04/2019   Dr. STamala Julianto get plain films of LB and hip (considering MRI due to her hx of cancer)   [place]ischemic cardiomyopathy [place]    Hx of takotsubo CM    Osteoporosis 2015   pt states "osteopenia", but then says that she refused to take the rx med for this condition, so I suspect she had osteoporosis.   Peripheral neuropathy 2017   Patient states diabetic neuropathy in feet prior to starting chemotherapy and then worsened by chemo.    Syncope and collapse    [place]  [place] prodrome.  Monitor ordered and cardiology referral ordered 05/14/21   TIA (transient ischemic attack) 03/26/2011   2012: question of (HA + R eye "floaters"). CT in ED neg acute. Not admitted, no f/u testing done.  ?ocular migraine?    Past Surgical History:  Procedure Laterality Date   ABDOMINAL HYSTERECTOMY  1972   APPENDECTOMY  1972   BREAST ENHANCEMENT SURGERY  1982   BREAST IMPLANT REMOVAL Right 09/13/2016   Procedure: REMOVAL RIGHT BREAST IMPLANT;  Surgeon: BIrene Limbo MD;  [facility]  Service: Plastics;  Laterality: Right;   BREAST LUMPECTOMY WITH RADIOACTIVE SEED AND SENTINEL LYMPH NODE BIOPSY Right 09/13/2016   Procedure: RIGHT BREAST LUMPECTOMY WITH RADIOACTIVE SEED X 2 AND SENTINEL LYMPH NODE BIOPSY;  Surgeon: DAlphonsa Overall MD;  [facility]  Service: General;  Laterality: Right;   BREAST SURGERY Right 03/14/2016   Biopsy   CAPSULECTOMY Right 09/13/2016   Procedure: RIGHT CAPSULECTOMY;  Surgeon: BIrene Limbo MD;  [facility]  Service: Clinical cytogeneticist;  Laterality: Right;   CATARACT EXTRACTION, BILATERAL Bilateral 08/10/17 right eye, 08/31/17 left eye   MASS EXCISION Left 02/28/2018   Path: benign.  Procedure: EXCISIONLEFT MEDIAL THIGH MASS ERAS PATHWAY;  Surgeon: Erroll Luna, MD;  Location: [place];  Service: General;  Laterality: Left;   PORTACATH PLACEMENT N/A 03/15/2016   Procedure: INSERTION PORT-A-CATH WITH [place];  Surgeon: Alphonsa Overall, MD;  [facility];  Service: General;  Laterality: N/A;   PORTACATH REMOVAL  07/2017   SLEEP STUDY   09/2021   NO SLEEP APNEA   surgical repair left ankle Left 2009   s/p [place]   Age 59   TOTAL HIP ARTHROPLASTY Left 10/06/2021   Procedure: TOTAL HIP ARTHROPLASTY ANTERIOR APPROACH;  Surgeon: [name], MD;  [facility];  Service: Orthopedics;  Laterality: Left;   US CAROTID DOPPLER BILATERAL ([place] H[facility])  01/2021   <50% bilat int carotid sten, otherwise normal.    Outpatient Medications Prior to Visit  Medication Sig Dispense Refill   atorvastatin (LIPITOR) 80 MG tablet Take 1 tablet (80 mg total) by mouth every evening. 90 tablet 3   carvedilol (COREG) 3.125 MG tablet Take 1 tablet (3.125 mg total) by mouth 2 (two) times daily with a meal. 180 tablet 1   cholecalciferol (VITAMIN D3) 25 MCG (1000 UNIT) tablet Take 1,000 Units by mouth daily.     DULoxetine (CYMBALTA) 60 MG capsule Take 1 capsule (60 mg total) by mouth daily. 90 capsule 1   folic acid (FOLVITE) 1 MG tablet Take 1 tablet (1 mg total) by mouth daily.     metFORMIN (GLUCOPHAGE-XR) 500 MG 24 hr tablet Take 2 tablets (1,000 mg total) by mouth daily before supper. 180 tablet 3   Multiple Vitamin (MULTIVITAMIN WITH MINERALS) TABS tablet Take 1 tablet by mouth daily.     OVER THE COUNTER MEDICATION Take 400 mg by mouth daily. Palmitoylethanolamide - suggested by Oncologist, study drug for neuropathy     Propylene Glycol (SYSTANE BALANCE OP) Place 1 drop into both eyes daily as needed (for dry eyes).     SYNTHROID 125 MCG tablet Take 1 tablet (125 mcg total) by mouth daily before breakfast. 90 tablet 0   thiamine 100 MG tablet Take 1 tablet (100 mg total) by mouth daily.     traZODone (DESYREL) 50 MG tablet Take 1 tablet (50 mg total) by mouth at bedtime. 90 tablet 1   valsartan (DIOVAN) 160 MG tablet Take 1 tablet (160 mg total) by mouth daily. 90 tablet 1   No facility-administered medications prior to visit.    Allergies  Allergen Reactions   Other Other (See Comments)     STEROIDS- emotional   Prednisone Other (See Comments)    Other reaction(s): Mental Status Changes (intolerance)   Amlodipine Other (See Comments)    Elevated creatinine   Hydrochlorothiazide Other (See Comments)    Elevated serum creatinine   Penicillins Other (See Comments)    Unsure of reaction, was 81 years old    ROS As per HPI  PE:    05/06/2022    2:00 PM 05/06/2022    1:57 PM 05/02/2022   11:37 AM  Vitals with BMI  Height   '5\' 8"'$   Weight  158 lbs 157 lbs 6 oz  BMI  37.85 88.50  Systolic 277 412 878  Diastolic 71 74 62  Pulse  57 77     Physical  Exam  Gen: Alert, well appearing.  Patient is oriented to person, place, time, and situation. Right mid forearm extensor surface with 1 cm oval pink papule.  Very soft/rubbery consistency.  Smooth surface, with just 1 punctate hyperkeratotic region.  LABS:  Last CBC Lab Results  Component Value Date   WBC 4.2 05/02/2022   HGB 12.9 05/02/2022   HCT 37.7 05/02/2022   MCV 96.2 05/02/2022   MCH 32.9 05/02/2022   RDW 13.0 05/02/2022   PLT 171 03/55/9741   Last metabolic panel Lab Results  Component Value Date   GLUCOSE 275 (H) 05/02/2022   NA 135 05/02/2022   K 5.3 (H) 05/02/2022   CL 99 05/02/2022   CO2 28 05/02/2022   BUN 24 (H) 05/02/2022   CREATININE 1.00 05/02/2022   GFRNONAA 57 (L) 05/02/2022   CALCIUM 10.0 05/02/2022   PROT 7.4 05/02/2022   ALBUMIN 4.8 05/02/2022   BILITOT 1.8 (H) 05/02/2022   ALKPHOS 70 05/02/2022   AST 22 05/02/2022   ALT 29 05/02/2022   ANIONGAP 8 05/02/2022   Last hemoglobin A1c Lab Results  Component Value Date   HGBA1C 7.1 (A) 07/20/2021   IMPRESSION AND PLAN:  Right forearm skin lesion. She has a history of basal cell carcinoma of the right ankle.  Discusse excision today and patient wanted to proceed.  Procedure: skin lesion removal.  Consent obtained.  Area prepped and draped after being infiltrated with 2 cc of 1% lidocaine with epinephrine.  Sterile technique  utilized derma blade to do shave excision technique.  I then used a scalpel to puncture the center of the resulting wound bed to see if any cystic or abscess fluid was present: none found.  No wound closure was needed. No immediate complications.  Pt tolerated procedure well. Specimen sent to pathology.  Wound care instructions discussed.  An After Visit Summary was printed and given to the patient.  FOLLOW UP: Return in about 3 months (around 08/05/2022) for  routine chronic illness f/u.  Signed:  Crissie Sickles, MD           05/06/2022

## 2022-05-08 NOTE — Therapy (Signed)
OUTPATIENT PHYSICAL THERAPY THORACOLUMBAR EVALUATION   Patient Name: Marissa Hall MRN: [id number] DOB:12-Apr-1941, 81 y.o., female Today's Date: 05/10/2022  END OF SESSION:  PT End of Session - 05/10/22 1113     Visit Number 1    Date for PT Re-Evaluation 07/05/22    Authorization Type MCR    Progress Note Due on Visit 10    PT Start Time 1113    PT Stop Time 1200    PT Time Calculation (min) 47 min    Activity Tolerance Patient tolerated treatment well    Behavior During Therapy [facility] for tasks assessed/performed             Past Medical History:  Diagnosis Date   Acute DVT (deep venous thrombosis) ([place]) 04/23/2019   Right popliteal   Alcoholism ([place])    Anxiety and depression 1964   oncologist started duloxetine 12/2016   Breast cancer ([place]) 03/14/2016   Clinical stage 2A: (triple neg): Right breast, upper inner quadrant, 03/2016.  Neoadjuvant chemo x 5 cycles,lumpectomy 4 mo later, then RT started 10/2016.  Adjuvant Xeloda [id number].  SWOG research trial pt 04/2017--pt randomized to pembrolizumab immunotherapy.  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  A1c Mar 12, 2018 was 7.0% at Dr. Shirlyn Goltz.    Epidermoid cyst of vulva    Chronic epidermoid cyst of the vulva.  Excision done 02/2019   GERD (gastroesophageal reflux disease) 2013   History of basal cell cancer    L ankle   Hyperlipidemia 1986   Hypertension 2008   2022 ->addition of hctz to lisinopril led to 79m drop in GFR. HCTZ d/cd.   Hypothyroidism 1988   Diagnosed in her 435s  Managed by Endocrinologist   Lumbar radiculopathy 04/2019   Dr.  STamala Julianto get plain films of LB and hip (considering MRI due to her hx of cancer)   [place]ischemic cardiomyopathy ([place]    Hx of takotsubo CM   Osteoporosis 2015   pt states "osteopenia", but then says that she refused to take the rx med for this condition, so I suspect she had osteoporosis.   Peripheral neuropathy 2017   Patient states diabetic neuropathy in feet prior to starting chemotherapy and then worsened by chemo.    Syncope and collapse    [place]  No prodrome.  Monitor ordered and cardiology referral ordered 05/14/21   TIA (transient ischemic attack) 03/26/2011   2012: question of (HA + R eye "floaters"). CT in ED neg acute. Not admitted, no f/u testing done.  ?ocular migraine?   Past Surgical History:  Procedure Laterality Date   ABDOMINAL HYSTERECTOMY  1972   APPENDECTOMY  1972   BREAST ENHANCEMENT SURGERY  1982   BREAST IMPLANT REMOVAL Right 09/13/2016   Procedure: REMOVAL RIGHT BREAST IMPLANT;  Surgeon: BIrene Limbo MD;  [facility]  Service: Plastics;  Laterality: Right;   BREAST LUMPECTOMY WITH RADIOACTIVE SEED AND SENTINEL LYMPH NODE BIOPSY Right 09/13/2016   Procedure: RIGHT BREAST LUMPECTOMY WITH RADIOACTIVE SEED X 2 AND SENTINEL LYMPH NODE BIOPSY;  Surgeon: DAlphonsa Overall MD;  [facility]  Service: General;  Laterality: Right;   BREAST SURGERY Right 03/14/2016   Biopsy   CAPSULECTOMY Right 09/13/2016   Procedure: RIGHT CAPSULECTOMY;  Surgeon: Irene Limbo, MD;  Location: [place];  Service: Plastics;  Laterality: Right;   CATARACT EXTRACTION, BILATERAL Bilateral 08/10/17 right eye, 08/31/17 left eye   MASS EXCISION Left 02/28/2018   Path: benign.  Procedure: EXCISIONLEFT MEDIAL THIGH MASS ERAS PATHWAY;  Surgeon: Erroll Luna, MD;  Location: [place];  Service: General;  Laterality: Left;   PORTACATH PLACEMENT N/A 03/15/2016   Procedure: INSERTION PORT-A-CATH WITH  [place];  Surgeon: Alphonsa Overall, MD;  [facility];  Service: General;  Laterality: N/A;   PORTACATH REMOVAL  07/2017   SLEEP STUDY  09/2021   NO SLEEP APNEA   surgical repair left ankle Left 2009   s/p [place]   Age 12   TOTAL HIP ARTHROPLASTY Left 10/06/2021   Procedure: TOTAL HIP ARTHROPLASTY ANTERIOR APPROACH;  Surgeon: [name], MD;  [facility];  Service: Orthopedics;  Laterality: Left;   US CAROTID DOPPLER BILATERAL ([place] HX)  01/2021   <50% bilat int carotid sten, otherwise normal.   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   Closed fracture of proximal end of left femur, initial encounter ([place]) 10/06/2021   Hyperkalemia 10/06/2021   Alcohol use 10/06/2021   Malnutrition of moderate degree 10/06/2021   Syncope and collapse 06/10/2021   Closed fracture of multiple ribs of right side 04/30/2021   Acquired hypothyroidism 02/23/2021   Type 2 diabetes mellitus with diabetic polyneuropathy, without long-term current use of insulin ([place]) 02/23/2021   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   Diabetes mellitus type 2 without retinopathy ([place]) 09/21/2020   Meibomian gland dysfunction (MGD) of both eyes 09/21/2020   History of COVID-19 06/23/2020   COVID-19 virus infection 05/27/2020   Piriformis syndrome of right side 05/16/2019   Lower extremity pain, right 04/23/2019   DVT (deep venous thrombosis) ([place]) 04/23/2019   Lumbar radiculopathy, right 04/02/2019   Cataracts, bilateral 07/2017   Other long term (current) drug therapy 06/14/2017   Anxiety and depression    History of breast cancer 03/02/2017   History of therapeutic radiation 03/02/2017   Breast pain, right 10/03/2016   Type 2 diabetes mellitus ([place]) 08/23/2016   Hypertension associated with diabetes ([place]) 08/23/2016   Hypothyroidism 08/23/2016   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   Port catheter in place 05/09/2016    Peripheral neuropathy 05/09/2016   Breast cancer of upper-inner quadrant of right female breast ([place]) 03/09/2016   Major depressive disorder, recurrent ([place]) 56/38/9373   Uncomplicated alcohol dependence ([place]) 02/19/2014   Osteoporosis 2015   GERD (gastroesophageal reflux disease) 2013   TIA (transient ischemic attack) 03/26/2011   Epidermoid cyst of vulva 2008   Hyperlipidemia associated with type 2 diabetes mellitus ([place]) 1986    PCP: Tammi Sou, MD   REFERRING PROVIDER:   [name], MD    REFERRING DIAG: M54.50 (ICD-10-CM) - Acute midline low back pain, unspecified whether sciatica present  M25.552 (ICD-10-CM) - Pain in left hip   Rationale for Evaluation and Treatment: Rehabilitation  THERAPY DIAG:  Muscle weakness (generalized)  Unsteadiness on feet  Other low back pain  ONSET DATE: this year since right before hip surgery  SUBJECTIVE:  SUBJECTIVE STATEMENT: Don't have pain just weakness. Intermittent pain in the left buttock. Difficulty with transfers sit to stand, commode and floor to stand. H/O falls since Nov 2022. She thinks she is having diabetic blackouts. She doesn't eat great and forgets to take her medicine. She lives alone with dogs, chickens and goats. No family nearby. Fasting sugar has been 150-170. Last Friday she had 2 glasses of wine and needed to be helped to the car. She drove home and was able to get in her house. Blood sugar and blood pressure are usually high. Plans to talk to endocrinologist about black outs. She states that all her falls have occurred after drinking.  PERTINENT HISTORY:  L THA, 10/06/21, peripheral neuropathy, Breast CA 2017, anxiety, depression, DVT R popliteal, DM, TIA 2012, 2 heart caths for broken heart disease  PAIN:  Are you  having pain? No  PRECAUTIONS: Fall  WEIGHT BEARING RESTRICTIONS: No  FALLS:  Has patient fallen in last 6 months? Yes. Number of falls 2 .September tripped over a painter's cloth, also trash bin started rolling and she couldn't keep up with it  LIVING ENVIRONMENT: Lives with: lives alone Lives in: House/apartment Stairs: Yes: Internal: 13 steps; walks around house to basement rather than use stairs. 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  LOWER EXTREMITY ROM:   WNL except bil hip IR L 24 deg, R 27 deg   LOWER EXTREMITY MMT:    MMT Right eval Left eval  Hip flexion 4 4-  Hip extension 5 5  Hip abduction 5 5  Hip adduction 5 5  Hip internal rotation    Hip external rotation    Knee flexion 5 4+  Knee extension 5 5  Ankle dorsiflexion 5 5   (Blank rows = not tested)   FUNCTIONAL TESTS:  5 times sit to stand: 19.83 seconds Berg Balance Scale: 47/56 indicates moderate >50% fall risk; cane outdoors  GAIT: WNL  TODAY'S TREATMENT:                                                                                                                              DATE:   05/10/22 See PT ED  PATIENT EDUCATION:  Education details: PT eval findings, anticipated  POC, need  for further assessment of balance, initial HEP, and gait safety with SPC outdoors  Person educated: Patient Education method: Explanation, Demonstration, Verbal cues, and Handouts Education comprehension: verbalized understanding and returned demonstration  HOME EXERCISE PROGRAM: Access Code: [email or web address] URL: [email or web address] Date: 05/10/2022 Prepared by: Almyra Free  Exercises - Sit to Stand  - 3 x daily - 7 x weekly - 1-2 sets - 5-10 reps - Tandem Stance with Support  - 2 x daily - 7 x weekly - 1 sets - 5 reps - max hold  ASSESSMENT:  CLINICAL IMPRESSION: Marissa Hall is a 81 y.o. female who was seen today for physical therapy evaluation and treatment for LE weakness and unsteadiness. She denies left hip pain or low back pain today but reports occasional stiffness. She is concerned due to recent falls, increased difficulty with sit to stand and floor to stand transfers and unsteadiness. She has mild strength deficits with MMT but functionally shows weakness with transfers and SLS activities. Her BERG also indicates she is at moderate risk for falls. She also reports that she is concerned she may be having diabetic blackouts and that alcohol may be affecting her blood sugar levels. Pt reports she will follow up with her endocrinologist. Marissa Hall will benefit from skilled PT to address the above deficits.  OBJECTIVE IMPAIRMENTS: decreased balance, decreased strength, decreased safety awareness, impaired flexibility, impaired sensation, and postural dysfunction.   ACTIVITY LIMITATIONS: stairs  PARTICIPATION LIMITATIONS: yard work  PERSONAL FACTORS: Age, Behavior pattern, Past/current experiences, and 3+ comorbidities: L THA, 10/06/21, peripheral neuropathy, Breast CA 2017, anxiety, depression, DVT R popliteal, DM, TIA 2012,  are also affecting patient's functional outcome.   REHAB POTENTIAL: Excellent  CLINICAL DECISION MAKING: Evolving/moderate complexity  EVALUATION  COMPLEXITY: Moderate   GOALS: Goals reviewed with patient? Yes  SHORT TERM GOALS: Target date: 06/07/2022 (Remove Blue Hyperlink)  Patient will be independent with initial HEP. Baseline:  Goal status: INITIAL  2.  DGI and/or FGA to be assessed by 05/24/22. Baseline:  Goal status: INITIAL  3.  Patient will be educated on strategies to decrease risk of falls.  Baseline:  Goal status: INITIAL   LONG TERM GOALS: Target date: 07/05/2022 (Remove Blue Hyperlink)  Patient will be independent with advanced/ongoing HEP to improve outcomes and carryover.  Baseline:  Goal status: INITIAL  2.  Patient will be able perform sit to stand transfer without UE support to ease commode transfers. Baseline:  Goal status: INITIAL  3.  Patient will be able to step up/down curb safely with LRAD for safety with community ambulation.  Baseline:  Goal status: INITIAL   4.  Patient will demonstrate improved functional LE strength as demonstrated by improved 5xSTS to <= 12.6 seconds (norm for age). Baseline: 19.83 sec Goal status: INITIAL  5.  Patient will demonstrate at least 19/24 on DGI to improve gait stability and reduce risk for falls. Baseline: TBD Goal status: INITIAL  6.  Patient will score 56/56 on Berg Balance test to demonstrate lower risk of falls.  Baseline: 47 Goal status: INITIAL  7.  Patient will be able to perform floor to stand transfer without use of support other than UEs. Baseline:  Goal status: INITIAL  PLAN:  PT FREQUENCY: 2x/week  PT DURATION: 8 weeks  PLANNED INTERVENTIONS: Therapeutic exercises, Therapeutic activity, Neuromuscular re-education, Balance training, Gait training, Patient/Family education, Self Care, Joint mobilization, Spinal mobilization, Cryotherapy, Moist heat, and Manual therapy.  PLAN FOR NEXT SESSION: Assess DGI. Work on balance, neutral COG, LE  functional strength steps, sit to stand and floor to stand transfers. Fall prevention  techniques.   Anzal Bartnick, PT 05/10/2022, 1:03 PM

## 2022-05-10 ENCOUNTER — Encounter: Payer: Self-pay | Admitting: Physical Therapy

## 2022-05-10 ENCOUNTER — Other Ambulatory Visit: Payer: Self-pay

## 2022-05-10 ENCOUNTER — Ambulatory Visit: Payer: Medicare Other | Attending: Orthopaedic Surgery | Admitting: Physical Therapy

## 2022-05-10 DIAGNOSIS — M545 Low back pain, unspecified: Secondary | ICD-10-CM | POA: Diagnosis not present

## 2022-05-10 DIAGNOSIS — M25552 Pain in left hip: Secondary | ICD-10-CM | POA: Insufficient documentation

## 2022-05-10 DIAGNOSIS — R2681 Unsteadiness on feet: Secondary | ICD-10-CM | POA: Insufficient documentation

## 2022-05-10 DIAGNOSIS — M6281 Muscle weakness (generalized): Secondary | ICD-10-CM | POA: Insufficient documentation

## 2022-05-10 DIAGNOSIS — M5459 Other low back pain: Secondary | ICD-10-CM | POA: Diagnosis not present

## 2022-05-14 ENCOUNTER — Encounter: Payer: Self-pay | Admitting: Family Medicine

## 2022-05-16 ENCOUNTER — Ambulatory Visit: Payer: Medicare Other

## 2022-05-18 ENCOUNTER — Ambulatory Visit: Payer: Medicare Other | Admitting: Physical Therapy

## 2022-05-18 ENCOUNTER — Encounter: Payer: Self-pay | Admitting: Family Medicine

## 2022-05-18 ENCOUNTER — Encounter: Payer: Self-pay | Admitting: Internal Medicine

## 2022-05-18 ENCOUNTER — Telehealth: Payer: Self-pay

## 2022-05-18 ENCOUNTER — Ambulatory Visit (INDEPENDENT_AMBULATORY_CARE_PROVIDER_SITE_OTHER): Payer: Medicare Other | Admitting: Internal Medicine

## 2022-05-18 VITALS — BP 143/80 | HR 73 | Ht 68.0 in | Wt 156.2 lb

## 2022-05-18 DIAGNOSIS — M6281 Muscle weakness (generalized): Secondary | ICD-10-CM

## 2022-05-18 DIAGNOSIS — E039 Hypothyroidism, unspecified: Secondary | ICD-10-CM

## 2022-05-18 DIAGNOSIS — M5459 Other low back pain: Secondary | ICD-10-CM

## 2022-05-18 DIAGNOSIS — E1142 Type 2 diabetes mellitus with diabetic polyneuropathy: Secondary | ICD-10-CM | POA: Diagnosis not present

## 2022-05-18 DIAGNOSIS — M25552 Pain in left hip: Secondary | ICD-10-CM | POA: Diagnosis not present

## 2022-05-18 DIAGNOSIS — R2681 Unsteadiness on feet: Secondary | ICD-10-CM | POA: Diagnosis not present

## 2022-05-18 DIAGNOSIS — C4492 Squamous cell carcinoma of skin, unspecified: Secondary | ICD-10-CM

## 2022-05-18 DIAGNOSIS — M545 Low back pain, unspecified: Secondary | ICD-10-CM | POA: Diagnosis not present

## 2022-05-18 LAB — POCT GLUCOSE (DEVICE FOR HOME USE): POC Glucose: 248 mg/dl — AB (ref 70–99)

## 2022-05-18 LAB — POCT GLYCOSYLATED HEMOGLOBIN (HGB A1C): Hemoglobin A1C: 7 % — AB (ref 4.0–5.6)

## 2022-05-18 LAB — TSH: TSH: 0.05 u[IU]/mL — ABNORMAL LOW (ref 0.35–5.50)

## 2022-05-18 NOTE — Progress Notes (Unsigned)
Name: Marissa Hall  MRN/ DOB: [id number], October 04, 1940   Age/ Sex: 81 y.o., female    PCP: Tammi Sou, MD   Reason for Endocrinology Evaluation: Type 2 Diabetes Mellitus     Date of Initial Endocrinology Visit: 07/20/2021    PATIENT IDENTIFIER: Marissa Hall is a 81 y.o. female with a past medical history of T2DM, Dyslipidemia, Hx of breast Ca, and Hypothyroidism. The patient presented for initial endocrinology clinic visit on 05/18/2022 for consultative assistance with her diabetes management.    HPI: Marissa Hall is transferring care from [place] [facility]. [place] Poli    Diagnosed with DM ~ 2005 Prior Medications tried/Intolerance: just metformin            Hemoglobin A1c has ranged from 7.0% in 2019, peaking at 7.4% in 2021.  On her initial visit to our clinic she had an A1c of 7.1%, SUBJECTIVE:   During the last visit (07/20/2021): A1c 7.1%   Today (05/18/22): Marissa Hall is here for follow-up on diabetes management and hypothyroidism. She checks her blood sugars occasionally . She did not bring her meter today   She continues to follow-up with oncology for Hx of breast CA diagnosed in 2017  Denies nausea, or vomiting but has occasional diarrhea that she attributes to metformin   She had a few episodes of syncope with a hip fracture, this to have been associated with drinking 2-3 glasses of wine, she has discontinued this    HOME DIABETES REGIMEN: Metformin 500 mg XR , BID  Synthroid 125 mcg daily       Statin: yes ACE-I/ARB: yes    METER DOWNLOAD SUMMARY:  Range 150-180 mg/dl     DIABETIC COMPLICATIONS: Microvascular complications:  Neuropathy Denies: CKD, retinopathy Last eye exam: Completed 09/27/2021  Macrovascular complications:  Nonischemic cardiomyopathy, TIA Denies: CAD, PVD, CVA   PAST HISTORY: Past Medical History:  Past Medical History:  Diagnosis Date   Acute DVT (deep venous thrombosis) ([place]) 04/23/2019   Right popliteal    Alcoholism ([place])    Anxiety and depression 1964   oncologist started duloxetine 12/2016   Breast cancer ([place]) 03/14/2016   Clinical stage 2A: (triple neg): Right breast, upper inner quadrant, 03/2016.  Neoadjuvant chemo x 5 cycles,lumpectomy 4 mo later, then RT started 10/2016.  Adjuvant Xeloda [id number].  SWOG research trial pt 04/2017--pt randomized to pembrolizumab immunotherapy.  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  A1c Mar 12, 2018 was 7.0% at Dr. Shirlyn Goltz.    Epidermoid cyst of vulva    Chronic epidermoid cyst of the vulva.  Excision done 02/2019   GERD (gastroesophageal reflux disease) 2013   History of basal cell cancer    L ankle   Hyperlipidemia 1986   Hypertension 2008   2022 ->addition of hctz to lisinopril led to 76m drop in GFR. HCTZ d/cd.   Hypothyroidism 1988   Diagnosed in her 48s  Managed by Endocrinologist   Lumbar radiculopathy 04/2019   Dr. STamala Julianto get plain films of LB and hip (considering MRI due to her hx of cancer)   [place]ischemic cardiomyopathy ([place]    Hx of takotsubo  CM   Osteoporosis 2015   pt states "osteopenia", but then says that she refused to take the rx med for this condition, so I suspect she had osteoporosis.   Peripheral neuropathy 2017   Patient states diabetic neuropathy in feet prior to starting chemotherapy and then worsened by chemo.    SCC (squamous cell carcinoma), arm, right    Syncope and collapse    05/2021 while in [place].  No prodrome.  Monitor ordered and cardiology referral ordered 05/14/21   TIA (transient ischemic attack) 03/26/2011   2012: question of (HA + R eye  "floaters"). CT in ED neg acute. Not admitted, no f/u testing done.  ?ocular migraine?   Past Surgical History:  Past Surgical History:  Procedure Laterality Date   ABDOMINAL HYSTERECTOMY  1972   APPENDECTOMY  1972   BREAST ENHANCEMENT SURGERY  1982   BREAST IMPLANT REMOVAL Right 09/13/2016   Procedure: REMOVAL RIGHT BREAST IMPLANT;  Surgeon: Irene Limbo, MD;  Location: [facility];  Service: Plastics;  Laterality: Right;   BREAST LUMPECTOMY WITH RADIOACTIVE SEED AND SENTINEL LYMPH NODE BIOPSY Right 09/13/2016   Procedure: RIGHT BREAST LUMPECTOMY WITH RADIOACTIVE SEED X 2 AND SENTINEL LYMPH NODE BIOPSY;  Surgeon: Alphonsa Overall, MD;  Location: [facility];  Service: General;  Laterality: Right;   BREAST SURGERY Right 03/14/2016   Biopsy   CAPSULECTOMY Right 09/13/2016   Procedure: RIGHT CAPSULECTOMY;  Surgeon: Irene Limbo, MD;  Location: [facility];  Service: Plastics;  Laterality: Right;   CATARACT EXTRACTION, BILATERAL Bilateral 08/10/17 right eye, 08/31/17 left eye   MASS EXCISION Left 02/28/2018   Path: benign.  Procedure: EXCISIONLEFT MEDIAL THIGH MASS ERAS PATHWAY;  Surgeon: Erroll Luna, MD;  Location: [facility];  Service: General;  Laterality: Left;   PORTACATH PLACEMENT N/A 03/15/2016   Procedure: INSERTION PORT-A-CATH WITH [place];  Surgeon: Alphonsa Overall, MD;  Location: [facility];  Service: General;  Laterality: N/A;   PORTACATH REMOVAL  07/2017   SLEEP STUDY  09/2021   NO SLEEP APNEA   surgical repair left ankle Left 2009   s/p [place]   Age 89   TOTAL HIP ARTHROPLASTY Left 10/06/2021   Procedure: TOTAL HIP ARTHROPLASTY ANTERIOR APPROACH;  Surgeon: [name], MD;  Location: [facility];  Service: Orthopedics;  Laterality: Left;   US CAROTID DOPPLER BILATERAL ([place] [facility])  01/2021   <50% bilat int carotid sten, otherwise normal.    Social History:  reports that she quit smoking about  23 years ago. Her smoking use included cigarettes. She smoked an average of 1 pack per day. She has never used smokeless tobacco. She reports current alcohol use of about 7.0 standard drinks of alcohol per week. She reports that she does not use drugs. Family History:  Family History  Problem Relation Age of Onset   Stroke Mother    Suicidality Father    Stroke Brother    Stroke Son    Sleep apnea Son      HOME MEDICATIONS: Allergies as of 05/18/2022       Reactions   Other Other (See Comments)   STEROIDS- emotional   Prednisone Other (See Comments)   Other reaction(s): Mental Status Changes (intolerance)   Amlodipine Other (See Comments)   Elevated creatinine   Hydrochlorothiazide Other (See Comments)   Elevated serum creatinine   Penicillins Other (See Comments)   Unsure of reaction, was 81 years old  Medication List        Accurate as of May 18, 2022  2:17 PM. If you have any questions, ask your nurse or doctor.          atorvastatin 80 MG tablet Commonly known as: LIPITOR Take 1 tablet (80 mg total) by mouth every evening.   carvedilol 3.125 MG tablet Commonly known as: COREG Take 1 tablet (3.125 mg total) by mouth 2 (two) times daily with a meal.   cholecalciferol 25 MCG (1000 UNIT) tablet Commonly known as: VITAMIN D3 Take 1,000 Units by mouth daily.   DULoxetine 60 MG capsule Commonly known as: Cymbalta Take 1 capsule (60 mg total) by mouth daily.   folic acid 1 MG tablet Commonly known as: FOLVITE Take 1 tablet (1 mg total) by mouth daily.   metFORMIN 500 MG 24 hr tablet Commonly known as: GLUCOPHAGE-XR Take 2 tablets (1,000 mg total) by mouth daily before supper.   multivitamin with minerals Tabs tablet Take 1 tablet by mouth daily.   OVER THE COUNTER MEDICATION Take 400 mg by mouth daily. Palmitoylethanolamide - suggested by Oncologist, study drug for neuropathy   Synthroid 125 MCG tablet Generic drug: levothyroxine Take 1  tablet (125 mcg total) by mouth daily before breakfast.   SYSTANE BALANCE OP Place 1 drop into both eyes daily as needed (for dry eyes).   thiamine 100 MG tablet Commonly known as: VITAMIN B1 Take 1 tablet (100 mg total) by mouth daily.   traZODone 50 MG tablet Commonly known as: DESYREL Take 1 tablet (50 mg total) by mouth at bedtime.   valsartan 160 MG tablet Commonly known as: DIOVAN Take 1 tablet (160 mg total) by mouth daily.         ALLERGIES: Allergies  Allergen Reactions   Other Other (See Comments)    STEROIDS- emotional   Prednisone Other (See Comments)    Other reaction(s): Mental Status Changes (intolerance)   Amlodipine Other (See Comments)    Elevated creatinine   Hydrochlorothiazide Other (See Comments)    Elevated serum creatinine   Penicillins Other (See Comments)    Unsure of reaction, was 81 years old     REVIEW OF SYSTEMS: A comprehensive ROS was conducted with the patient and is negative except as per HPI    OBJECTIVE:   VITAL SIGNS: BP (!) 143/80 (BP Location: Left Arm, Patient Position: Sitting, Cuff Size: Small)   Pulse 73   Ht '5\' 8"'$  (1.727 m)   Wt 156 lb 3.2 oz (70.9 kg)   SpO2 96%   BMI 23.75 kg/m    PHYSICAL EXAM:  General: Pt appears well and is in NAD  Neck: General: Supple without adenopathy or carotid bruits. Thyroid: Thyroid size normal.  No goiter or nodules appreciated.  Lungs: Clear with good BS bilat with no rales, rhonchi, or wheezes  Heart: RRR   Abdomen: soft, nontender, without masses or organomegaly palpable  Extremities:  Lower extremities - No pretibial edema. No lesions.  Neuro: MS is good with appropriate affect, pt is alert and Ox3    DM foot exam: 07/20/2021  The skin of the feet is intact without sores or ulcerations. The pedal pulses are 2+ on right and 2+ on left. The sensation is intact to a screening 5.07, 10 gram monofilament bilaterally   DATA REVIEWED:  Lab Results  Component Value Date    HGBA1C 7.1 (A) 07/20/2021   HGBA1C 7.2 (A) 02/23/2021   HGBA1C 7.4 03/23/2020    Latest  Reference Range & Units 05/18/22 14:35  TSH 0.35 - 5.50 uIU/mL 0.05 (L)     Latest Reference Range & Units 05/02/22 11:28  Sodium 135 - 145 mmol/L 135  Potassium 3.5 - 5.1 mmol/L 5.3 (H)  Chloride 98 - 111 mmol/L 99  CO2 22 - 32 mmol/L 28  Glucose 70 - 99 mg/dL 275 (H)  BUN 8 - 23 mg/dL 24 (H)  Creatinine 0.44 - 1.00 mg/dL 1.00  Calcium 8.9 - 10.3 mg/dL 10.0  Anion gap 5 - 15  8  Alkaline Phosphatase 38 - 126 U/L 70  Albumin 3.5 - 5.0 g/dL 4.8  AST 15 - 41 U/L 22  ALT 0 - 44 U/L 29  Total Protein 6.5 - 8.1 g/dL 7.4  Total Bilirubin 0.3 - 1.2 mg/dL 1.8 (H)  GFR, Est Non African American >60 mL/min 57 (L)    In-office BG 248 mg/dL    ASSESSMENT / PLAN / RECOMMENDATIONS:   1) Type 2 Diabetes Mellitus, Optimally controlled, With neuropathic and Macrovascular complications - Most recent A1c of 7.1 %. Goal A1c < 7.5 %.    - A1c at goal  but she has been noted with postprandial hyperglycemia  - She endorses occasional diarrhea that she attributes to metformin , after discussing alternatives to include SU, SGLT-2i and GLP-1 agonists risks and benefits, we opted to stay on metformin for now  - We discussed effects of wine and ETOH in general on ones' health and on glucose and BP and she already had stopped it  - No changes today   MEDICATIONS: Continue Metformin 500 mg XR , BID  EDUCATION / INSTRUCTIONS: BG monitoring instructions: Patient is instructed to check her blood sugars 1 times a day, fasting . Call [facility] if: BG persistently < 70  I reviewed the Rule of 15 for the treatment of hypoglycemia in detail with the patient. Literature supplied.   2) Diabetic complications:  Eye: Does not have known diabetic retinopathy.  Neuro/ Feet: Does  have known diabetic peripheral neuropathy. Renal: Patient does not have known baseline CKD. She is  on an ACEI/ARB at  present.     3) Hypothyroidism :  - Pt with fatigue  - TSH Is low, will reduce Synthroid as below  Medication  Stop Synthroid 125 mcg daily Start Synthroid 112 mcg daily    F/U in 6 months   Signed electronically by: Mack Guise, MD  [facility]  [facility] [address] [place], [place] [address] Phone: [phone] FAX: [phone]   CC: Tammi Sou, MD [address] [place] [address] Phone: [phone]  Fax: [phone]    Return to Endocrinology clinic as below: Future Appointments  Date Time Provider [place]  05/18/2022  2:20 PM Shaquaya Wuellner, Melanie Crazier, MD [facility] None  05/23/2022 11:00 AM Artist Pais, PTA [facility]  05/25/2022 11:00 AM Percival Spanish, PT [facility]  05/09/2023 10:30 AM Nicholas Lose, MD [facility] None

## 2022-05-18 NOTE — Patient Instructions (Signed)
Continue Metformin 500 mg, 1 tablet twice daily     You are on levothyroxine - which is your thyroid hormone supplement. You MUST take this consistently.  You should take this first thing in the morning on an empty stomach with water. You should not take it with other medications. Wait 66mn to 1hr prior to eating. If you are taking any vitamins - please take these in the evening.   If you miss a dose, please take your missed dose the following day (double the dose for that day). You should have a pill box for ONLY levothyroxine on your bedside table to help you remember to take your medications.

## 2022-05-18 NOTE — Therapy (Signed)
OUTPATIENT PHYSICAL THERAPY TREATMENT   Patient Name: Marissa Hall MRN: [id number] DOB:[birth year], 81 y.o., female Today's Date: 05/18/2022  END OF SESSION:  PT End of Session - 05/18/22 1158     Visit Number 2    Date for PT Re-Evaluation 07/05/22    Authorization Type MCR    Progress Note Due on Visit 10    PT Start Time 1158   Pt arrived late   PT Stop Time 1232    PT Time Calculation (min) 34 min    Activity Tolerance Patient tolerated treatment well    Behavior During Therapy [facility] for tasks assessed/performed             Past Medical History:  Diagnosis Date   Acute DVT (deep venous thrombosis) ([place]) 04/23/2019   Right popliteal   Alcoholism ([place])    Anxiety and depression 1964   oncologist started duloxetine 12/2016   Breast cancer ([place]) 03/14/2016   Clinical stage 2A: (triple neg): Right breast, upper inner quadrant, 03/2016.  Neoadjuvant chemo x 5 cycles,lumpectomy 4 mo later, then RT started 10/2016.  Adjuvant Xeloda [id number].  SWOG research trial pt 04/2017--pt randomized to pembrolizumab immunotherapy.  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) 6440   managed by endocrinology.  A1c Mar 12, 2018 was 7.0% at Dr. Shirlyn Goltz.    Epidermoid cyst of vulva    Chronic epidermoid cyst of the vulva.  Excision done 02/2019   GERD (gastroesophageal reflux disease) 2013   History of basal cell cancer    L ankle   Hyperlipidemia 1986   Hypertension 2008   2022 ->addition of hctz to lisinopril led to 48m drop in GFR. HCTZ d/cd.   Hypothyroidism 1988   Diagnosed in her 458s  Managed by Endocrinologist   Lumbar radiculopathy 04/2019   Dr.  STamala Julianto get plain films of LB and hip (considering MRI due to her hx of cancer)   [place]ischemic [place]    Hx of takotsubo CM   Osteoporosis 2015   pt states "osteopenia", but then says that she refused to take the rx med for this condition, so I suspect she had osteoporosis.   Peripheral neuropathy 2017   Patient states diabetic neuropathy in feet prior to starting chemotherapy and then worsened by chemo.    SCC (squamous cell carcinoma), arm, right    Syncope and collapse    05[place]  No prodrome.  Monitor ordered and cardiology referral ordered 05/14/21   TIA (transient ischemic attack) 03/26/2011   2012: question of (HA + R eye "floaters"). CT in ED neg acute. Not admitted, no f/u testing done.  ?ocular migraine?   Past Surgical History:  Procedure Laterality Date   ABDOMINAL HYSTERECTOMY  1972   APPENDECTOMY  1972   BREAST ENHANCEMENT SURGERY  1982   BREAST IMPLANT REMOVAL Right 09/13/2016   Procedure: REMOVAL RIGHT BREAST IMPLANT;  Surgeon: BIrene Limbo MD;  [place]  Service: Plastics;  Laterality: Right;   BREAST LUMPECTOMY WITH RADIOACTIVE SEED AND SENTINEL LYMPH NODE BIOPSY Right 09/13/2016   Procedure: RIGHT BREAST LUMPECTOMY WITH RADIOACTIVE SEED X 2 AND SENTINEL LYMPH NODE  BIOPSY;  Surgeon: Alphonsa Overall, MD;  Location: [place];  Service: General;  Laterality: Right;   BREAST SURGERY Right 03/14/2016   Biopsy   CAPSULECTOMY Right 09/13/2016   Procedure: RIGHT CAPSULECTOMY;  Surgeon: Irene Limbo, MD;  Location: [place];  Service: Plastics;  Laterality: Right;   CATARACT EXTRACTION, BILATERAL Bilateral 08/10/17 right eye, 08/31/17 left eye   MASS EXCISION Left 02/28/2018   Path: benign.  Procedure: EXCISIONLEFT MEDIAL THIGH MASS ERAS PATHWAY;  Surgeon: Erroll Luna, MD;  Location: [place];  Service: General;  Laterality: Left;   PORTACATH PLACEMENT N/A  03/15/2016   Procedure: INSERTION PORT-A-CATH WITH [place];  Surgeon: Alphonsa Overall, MD;  Location: [facility];  Service: General;  Laterality: N/A;   PORTACATH REMOVAL  07/2017   SLEEP STUDY  09/2021   NO SLEEP APNEA   surgical repair left ankle Left 2009   s/p [place]   Age 13   TOTAL HIP ARTHROPLASTY Left 10/06/2021   Procedure: TOTAL HIP ARTHROPLASTY ANTERIOR APPROACH;  Surgeon: [name], MD;  Location: [facility];  Service: Orthopedics;  Laterality: Left;   US CAROTID DOPPLER BILATERAL ([place] HX)  01/2021   <50% bilat int carotid sten, otherwise normal.   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   Closed fracture of proximal end of left femur, initial encounter ([place]) 10/06/2021   Hyperkalemia 10/06/2021   Alcohol use 10/06/2021   Malnutrition of moderate degree 10/06/2021   Syncope and collapse 06/10/2021   Closed fracture of multiple ribs of right side 04/30/2021   Acquired hypothyroidism 02/23/2021   Type 2 diabetes mellitus with diabetic polyneuropathy, without long-term current use of insulin ([place]) 02/23/2021   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   Diabetes mellitus type 2 without retinopathy ([place]) 09/21/2020   Meibomian gland dysfunction (MGD) of both eyes 09/21/2020   History of COVID-19 06/23/2020   COVID-19 virus infection 05/27/2020   Piriformis syndrome of right side 05/16/2019   Lower extremity pain, right 04/23/2019   DVT (deep venous thrombosis) ([place]) 04/23/2019   Lumbar radiculopathy, right 04/02/2019   Cataracts, bilateral 07/2017   Other long term (current) drug therapy 06/14/2017   Anxiety and depression    History of breast cancer 03/02/2017   History of therapeutic radiation 03/02/2017   Breast pain, right 10/03/2016   Type 2 diabetes mellitus ([place]) 08/23/2016   Hypertension associated with diabetes ([place]) 08/23/2016   Hypothyroidism 08/23/2016   Chemotherapy-induced neuropathy  ([employer]) 07/04/2016   Port catheter in place 05/09/2016   Peripheral neuropathy 05/09/2016   Breast cancer of upper-inner quadrant of right female breast ([place]) 03/09/2016   Major depressive disorder, recurrent ([place]) 78/24/2353   Uncomplicated alcohol dependence ([place]) 02/19/2014   Osteoporosis 2015   GERD (gastroesophageal reflux disease) 2013   TIA (transient ischemic attack) 03/26/2011   Epidermoid cyst of vulva 2008   Hyperlipidemia associated with type 2 diabetes mellitus ([place]) 1986    PCP: Tammi Sou, MD   REFERRING PROVIDER:   [name], MD    REFERRING DIAG: M54.50 (ICD-10-CM) - Acute midline low back pain, unspecified whether sciatica present  M25.552 (ICD-10-CM) - Pain in left hip   Rationale for Evaluation and Treatment: Rehabilitation  THERAPY DIAG:  Muscle weakness (generalized)  Unsteadiness on feet  Other low back pain  ONSET DATE: this year since right before hip surgery  SUBJECTIVE:  SUBJECTIVE STATEMENT: Don't have pain just weakness. Intermittent pain in the left buttock. Difficulty with transfers sit to stand, commode and floor to stand. H/O falls since Nov 2022. She thinks she is having diabetic blackouts. She doesn't eat great and forgets to take her medicine. She lives alone with dogs, chickens and goats. No family nearby. Fasting sugar has been 150-170. Last Friday she had 2 glasses of wine and needed to be helped to the car. She drove home and was able to get in her house. Blood sugar and blood pressure are usually high. Plans to talk to endocrinologist about black outs. She states that all her falls have occurred after drinking.  PERTINENT HISTORY:  L THA, 10/06/21, peripheral neuropathy, Breast CA 2017, anxiety, depression, DVT R popliteal, DM, TIA 2012, 2 heart  caths for broken heart disease  PAIN:  Are you having pain? No  PRECAUTIONS: Fall  WEIGHT BEARING RESTRICTIONS: No  FALLS:  Has patient fallen in last 6 months? Yes. Number of falls 2 .September tripped over a painter's cloth, also trash bin started rolling and she couldn't keep up with it  LIVING ENVIRONMENT: Lives with: lives alone Lives in: House/apartment Stairs: Yes: Internal: 13 steps; walks around house to basement rather than use stairs. 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  LOWER EXTREMITY ROM:   WNL except bil hip IR L 24 deg, R 27 deg   LOWER EXTREMITY MMT:    MMT Right eval Left eval  Hip flexion 4 4-  Hip extension 5 5  Hip abduction 5 5  Hip adduction 5 5  Hip internal rotation    Hip external rotation    Knee flexion 5 4+  Knee extension 5 5  Ankle dorsiflexion 5 5   (Blank rows = not tested)   FUNCTIONAL TESTS:  5 times sit to stand: 19.83 seconds 10 meter walk test: 9.72 sec; Gait speed = 3.37 ft/sec (05/18/22) Berg Balance Scale: 47/56 indicates moderate >50% fall risk; cane outdoors Functional gait assessment: 19/30; < 19 = high risk fall (05/18/22)  GAIT: WNL   TODAY'S TREATMENT:  DATE:    05/18/22 THERAPEUTIC ACTIVITIES: 10MWT: 9.72 sec Gait speed: 3.37 ft/sec FGA = 19/30; < 19 = high risk fall  Sit to stand x 10 - cues for controlled descent  SELF CARE: Reviewed Check for Safety - Home Fall Prevention Checklist for Older Adults with patient to help identify hazards in home that may contribute to increased fall risk and discussed appropriate modifications or strategies to reduce fall risk in home.  NEUROMUSCULAR RE-EDUCATION: To improve balance, proprioception, and reduce fall risk. Corner balance progression with 1/2 tandem stance and tandem stance on firm surface with arms crossed on chest            Eyes open: - static stance x 30 sec in both tandem and 1/2 tandem, remainder of activities in 1/2 tandem - horiz head turns x 5 - vertical head nods x 5 - trunk rotation x 5 Eyes closed: - static stance x 10 sec in 1/2 tandem   05/10/22 See PT ED   PATIENT EDUCATION:  Education details: PT eval findings and Check for Worth for Older Adults  Person educated: Patient Education method: Explanation and Handouts Education comprehension: verbalized understanding  HOME EXERCISE PROGRAM: Access Code: [email or web address] URL: [email or web address] Date: 05/18/2022 Prepared by: Annie Paras  Exercises - Sit to Stand  - 3 x daily - 7 x weekly - 1-2 sets - 5-10 reps - Tandem Stance with Support  - 2 x daily - 7 x weekly - 1 sets - 5 reps - max hold  Patient Education - Check for Safety   ASSESSMENT:  CLINICAL IMPRESSION: Balance testing completed with gait speed on par with community ambulation but FGA revealing a high risk for falls. Reviewed the "Check for Safety - Home Fall Prevention Checklist for Older Adults" to help identify fall risk hazards in the home along with strategies to reduce fall risk at home with Pamala Hurry stating that most items  reviewed were already addressed in her home. Initial HEP reviewed but only minimal progression attempted today due to time constraints as patient arrived late to session.  OBJECTIVE IMPAIRMENTS: decreased balance, decreased strength, decreased safety awareness, impaired flexibility, impaired sensation, and postural dysfunction.   ACTIVITY LIMITATIONS: stairs  PARTICIPATION LIMITATIONS: yard work  PERSONAL FACTORS: Age, Behavior pattern, Past/current experiences, and 3+ comorbidities: L THA, 10/06/21, peripheral neuropathy, Breast CA 2017, anxiety, depression, DVT R popliteal, DM, TIA 2012,  are also affecting patient's functional outcome.   REHAB POTENTIAL: Excellent  CLINICAL DECISION MAKING: Evolving/moderate complexity  EVALUATION COMPLEXITY: Moderate   GOALS: Goals reviewed with patient? Yes  SHORT TERM GOALS: Target date: 06/07/2022   Patient will be independent with initial HEP. Baseline:  Goal status: IN PROGRESS  2.  DGI and/or FGA to be assessed by 05/24/22. Baseline:  Goal status: IN PROGRESS  3.  Patient will be educated on strategies to decrease risk of falls.  Baseline:  Goal status: IN PROGRESS  LONG TERM GOALS: Target date: 07/05/2022   Patient will be independent with advanced/ongoing HEP to improve outcomes and carryover.  Baseline:  Goal status: IN PROGRESS  2.  Patient will be able perform sit to stand transfer without UE support to ease commode transfers. Baseline:  Goal status: IN PROGRESS  3.  Patient will be able to step up/down curb safely with LRAD for safety with community ambulation.  Baseline:  Goal status: IN PROGRESS   4.  Patient will demonstrate improved functional LE strength as demonstrated by improved 5xSTS to <=  12.6 seconds (norm for age). Baseline: 19.83 sec Goal status: IN PROGRESS  5.  Patient will demonstrate at least 23/30 on FGA to improve gait stability and reduce risk for falls. Baseline: 19/30 (05/18/22) Goal status: IN  PROGRESS  6.  Patient will score 56/56 on Berg Balance test to demonstrate lower risk of falls.  Baseline: 47 Goal status: IN PROGRESS  7.  Patient will be able to perform floor to stand transfer without use of support other than UEs. Baseline:  Goal status: IN PROGRESS  PLAN:  PT FREQUENCY: 2x/week  PT DURATION: 8 weeks  PLANNED INTERVENTIONS: Therapeutic exercises, Therapeutic activity, Neuromuscular re-education, Balance training, Gait training, Patient/Family education, Self Care, Joint mobilization, Spinal mobilization, Cryotherapy, Moist heat, and Manual therapy.  PLAN FOR NEXT SESSION: Work on balance, neutral COG, LE functional strength steps, sit to stand and floor to stand transfers. Review fall prevention techniques PRN.   Percival Spanish, PT 05/18/2022, 1:26 PM

## 2022-05-18 NOTE — Telephone Encounter (Signed)
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  It does not look like the excision I did got all of the lesion removed. This would be best addressed by dermatology at this point. Reassure her that this is a localized cancer, has no potential to spread anywhere else. If she does not already have a dermatologist please refer to [facility] dermatology, diagnosis squamous cell carcinoma of skin.

## 2022-05-19 MED ORDER — SYNTHROID 112 MCG PO TABS: 112.0000 ug | ORAL_TABLET | Freq: Every day | ORAL | 2 refills | Status: DC

## 2022-05-23 ENCOUNTER — Ambulatory Visit: Payer: Medicare Other

## 2022-05-23 DIAGNOSIS — M6281 Muscle weakness (generalized): Secondary | ICD-10-CM | POA: Diagnosis not present

## 2022-05-23 DIAGNOSIS — M545 Low back pain, unspecified: Secondary | ICD-10-CM | POA: Diagnosis not present

## 2022-05-23 DIAGNOSIS — M5459 Other low back pain: Secondary | ICD-10-CM

## 2022-05-23 DIAGNOSIS — R2681 Unsteadiness on feet: Secondary | ICD-10-CM

## 2022-05-23 DIAGNOSIS — M25552 Pain in left hip: Secondary | ICD-10-CM | POA: Diagnosis not present

## 2022-05-23 NOTE — Therapy (Signed)
OUTPATIENT PHYSICAL THERAPY TREATMENT   Patient Name: Marissa Hall MRN: [id number] DOB:[birth year], 81 y.o., female Today's Date: 05/23/2022  END OF SESSION:  PT End of Session - 05/23/22 1155     Visit Number 3    Date for PT Re-Evaluation 07/05/22    Authorization Type MCR    Progress Note Due on Visit 10    PT Start Time 1108   pt late   PT Stop Time 1150    PT Time Calculation (min) 42 min    Activity Tolerance Patient tolerated treatment well    Behavior During Therapy [facility] for tasks assessed/performed              Past Medical History:  Diagnosis Date   Acute DVT (deep venous thrombosis) ([place]) 04/23/2019   Right popliteal   Alcoholism ([place])    Anxiety and depression 1964   oncologist started duloxetine 12/2016   Breast cancer ([place]) 03/14/2016   Clinical stage 2A: (triple neg): Right breast, upper inner quadrant, 03/2016.  Neoadjuvant chemo x 5 cycles,lumpectomy 4 mo later, then RT started 10/2016.  Adjuvant Xeloda [id number].  SWOG research trial pt 04/2017--pt randomized to pembrolizumab immunotherapy.  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  A1c Mar 12, 2018 was 7.0% at Dr. Shirlyn Goltz.    Epidermoid cyst of vulva    Chronic epidermoid cyst of the vulva.  Excision done 02/2019   GERD (gastroesophageal reflux disease) 2013   History of basal cell cancer    L ankle   Hyperlipidemia 1986   Hypertension 2008   2022 ->addition of hctz to lisinopril led to 53m drop in GFR. HCTZ d/cd.   Hypothyroidism 1988   Diagnosed in her 474s  Managed by Endocrinologist   Lumbar radiculopathy 04/2019   Dr. STamala Julian to get plain films of LB and hip (considering MRI due to her hx of cancer)   Nonischemic cardiomyopathy ([place]    Hx of takotsubo CM   Osteoporosis 2015   pt states "osteopenia", but then says that she refused to take the rx med for this condition, so I suspect she had osteoporosis.   Peripheral neuropathy 2017   Patient states diabetic neuropathy in feet prior to starting chemotherapy and then worsened by chemo.    SCC (squamous cell carcinoma), arm, right    Syncope and collapse    05/2021 while in [place]  No prodrome.  Monitor ordered and cardiology referral ordered 05/14/21   TIA (transient ischemic attack) 03/26/2011   2012: question of (HA + R eye "floaters"). CT in ED neg acute. Not admitted, no f/u testing done.  ?ocular migraine?   Past Surgical History:  Procedure Laterality Date   ABDOMINAL HYSTERECTOMY  1972   APPENDECTOMY  1972   BREAST ENHANCEMENT SURGERY  1982   BREAST IMPLANT REMOVAL Right 09/13/2016   Procedure: REMOVAL RIGHT BREAST IMPLANT;  Surgeon: BIrene Limbo MD;  Location: [facility]  Service: Plastics;  Laterality: Right;   BREAST LUMPECTOMY WITH RADIOACTIVE SEED AND SENTINEL LYMPH NODE BIOPSY Right 09/13/2016   Procedure: RIGHT BREAST LUMPECTOMY WITH RADIOACTIVE SEED X 2 AND SENTINEL LYMPH NODE  BIOPSY;  Surgeon: Alphonsa Overall, MD;  Location: [facility];  Service: General;  Laterality: Right;   BREAST SURGERY Right 03/14/2016   Biopsy   CAPSULECTOMY Right 09/13/2016   Procedure: RIGHT CAPSULECTOMY;  Surgeon: Irene Limbo, MD;  Location: [facility];  Service: Plastics;  Laterality: Right;   CATARACT EXTRACTION, BILATERAL Bilateral 08/10/17 right eye, 08/31/17 left eye   MASS EXCISION Left 02/28/2018   Path: benign.  Procedure: EXCISIONLEFT MEDIAL THIGH MASS ERAS PATHWAY;  Surgeon: Erroll Luna, MD;  Location: [facility];  Service: General;  Laterality: Left;   PORTACATH PLACEMENT N/A 03/15/2016    Procedure: INSERTION PORT-A-CATH WITH [place];  Surgeon: Alphonsa Overall, MD;  Location: [facility];  Service: General;  Laterality: N/A;   PORTACATH REMOVAL  07/2017   SLEEP STUDY  09/2021   NO SLEEP APNEA   surgical repair left ankle Left 2009   s/p [place]   Age 17   TOTAL HIP ARTHROPLASTY Left 10/06/2021   Procedure: TOTAL HIP ARTHROPLASTY ANTERIOR APPROACH;  Surgeon: [name], MD;  Location: [facility];  Service: Orthopedics;  Laterality: Left;   US CAROTID DOPPLER BILATERAL ([place] HX)  01/2021   <50% bilat int carotid sten, otherwise normal.   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   Closed fracture of proximal end of left femur, initial encounter ([place]) 10/06/2021   Hyperkalemia 10/06/2021   Alcohol use 10/06/2021   Malnutrition of moderate degree 10/06/2021   Syncope and collapse 06/10/2021   Closed fracture of multiple ribs of right side 04/30/2021   Acquired hypothyroidism 02/23/2021   Type 2 diabetes mellitus with diabetic polyneuropathy, without long-term current use of insulin ([place]) 02/23/2021   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   Diabetes mellitus type 2 without retinopathy ([place]) 09/21/2020   Meibomian gland dysfunction (MGD) of both eyes 09/21/2020   History of COVID-19 06/23/2020   COVID-19 virus infection 05/27/2020   Piriformis syndrome of right side 05/16/2019   Lower extremity pain, right 04/23/2019   DVT (deep venous thrombosis) ([place]) 04/23/2019   Lumbar radiculopathy, right 04/02/2019   Cataracts, bilateral 07/2017   Other long term (current) drug therapy 06/14/2017   Anxiety and depression    History of breast cancer 03/02/2017   History of therapeutic radiation 03/02/2017   Breast pain, right 10/03/2016   Type 2 diabetes mellitus ([place]) 08/23/2016   Hypertension associated with diabetes ([place]) 08/23/2016   Hypothyroidism 08/23/2016   Chemotherapy-induced neuropathy ([place])  07/04/2016   Port catheter in place 05/09/2016   Peripheral neuropathy 05/09/2016   Breast cancer of upper-inner quadrant of right female breast ([place]) 03/09/2016   Major depressive disorder, recurrent ([place]) 95/62/1308   Uncomplicated alcohol dependence ([place]) 02/19/2014   Osteoporosis 2015   GERD (gastroesophageal reflux disease) 2013   TIA (transient ischemic attack) 03/26/2011   Epidermoid cyst of vulva 2008   Hyperlipidemia associated with type 2 diabetes mellitus ([place]) 1986    PCP: Tammi Sou, MD   REFERRING PROVIDER:   [name], MD    REFERRING DIAG: M54.50 (ICD-10-CM) - Acute midline low back pain, unspecified whether sciatica present  M25.552 (ICD-10-CM) - Pain in left hip   Rationale for Evaluation and Treatment: Rehabilitation  THERAPY DIAG:  Muscle weakness (generalized)  Unsteadiness on feet  Other low back pain  ONSET DATE: this year since right before hip surgery  SUBJECTIVE:  SUBJECTIVE STATEMENT: Pt reports soreness in her low back, not bad, balance is still bad when walking.  PERTINENT HISTORY:  L THA, 10/06/21, peripheral neuropathy, Breast CA 2017, anxiety, depression, DVT R popliteal, DM, TIA 2012, 2 heart caths for broken heart disease  PAIN:  Are you having pain? No  PRECAUTIONS: Fall  WEIGHT BEARING RESTRICTIONS: No  FALLS:  Has patient fallen in last 6 months? Yes. Number of falls 2 .September tripped over a painter's cloth, also trash bin started rolling and she couldn't keep up with it  LIVING ENVIRONMENT: Lives with: lives alone Lives in: House/apartment Stairs: Yes: Internal: 13 steps; walks around house to basement rather than use stairs. 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  LOWER EXTREMITY ROM:   WNL except bil hip IR L 24 deg, R 27 deg   LOWER EXTREMITY MMT:    MMT Right eval Left eval  Hip flexion 4 4-  Hip extension 5 5  Hip abduction 5 5  Hip adduction 5 5  Hip internal rotation    Hip external rotation    Knee flexion 5 4+  Knee extension 5 5  Ankle dorsiflexion 5 5   (Blank rows = not tested)   FUNCTIONAL TESTS:  5 times sit to stand: 19.83 seconds 10 meter walk test: 9.72 sec; Gait speed = 3.37 ft/sec (05/18/22) Berg Balance Scale: 47/56 indicates moderate >50% fall risk; cane outdoors Functional gait assessment: 19/30; < 19 = high risk fall (05/18/22)  GAIT: WNL   TODAY'S TREATMENT:                                                                                                                              DATE:    05/23/22 Nustep [id number]  NEUROMUSCULAR RE-EDUCATION: To improve balance, proprioception, and reduce fall risk. Corner balance: Tandem stance 1 trial EO x 30 sec; 2nd trial EC (multiple attempts to maintain balance on walls and postural swaying) Walking with horizontal and vertical head turns 2x170 ft Walking with stop and turn around 1x170 ft Trunk rotation with D2 flexion bil standing on airex x 10 Step downs and back up from airex 10x bil  05/18/22 THERAPEUTIC  ACTIVITIES: 10MWT: 9.72 sec Gait speed: 3.37 ft/sec FGA =  19/30; < 19 = high risk fall  Sit to stand x 10 - cues for controlled descent  SELF CARE: Reviewed Check for Safety - Home Fall Prevention Checklist for Older Adults with patient to help identify hazards in home that may contribute to increased fall risk and discussed appropriate modifications or strategies to reduce fall risk in home.  NEUROMUSCULAR RE-EDUCATION: To improve balance, proprioception, and reduce fall risk. Corner balance progression with 1/2 tandem stance and tandem stance on firm surface with arms crossed on chest            Eyes open: - static stance x 30 sec in both tandem and 1/2 tandem, remainder of activities in 1/2 tandem - horiz head turns x 5 - vertical head nods x 5 - trunk rotation x 5 Eyes closed: - static stance x 10 sec in 1/2 tandem   05/10/22 See PT ED   PATIENT EDUCATION:  Education details: HEP update - standing trunk rotations and head turns Person educated: Patient Education method: Explanation and Handouts Education comprehension: verbalized understanding  HOME EXERCISE PROGRAM: Access Code: [email or web address] URL: [email or web address] Date: 05/18/2022 Prepared by: Annie Paras  Exercises - Sit to Stand  - 3 x daily - 7 x weekly - 1-2 sets - 5-10 reps - Tandem Stance with Support  - 2 x daily - 7 x weekly - 1 sets - 5 reps - max hold  Patient Education - Check for Safety   ASSESSMENT:  CLINICAL IMPRESSION: Pt arrived late to session. We were able to progress balance training to improve proprioception and reactive responses. She noted feelings of instability with the gait and head turns. She did show more instability and postural sway with the tandem stance with EC. Updated HEP while reviewing and ensuring patient understanding of new exercises.  OBJECTIVE IMPAIRMENTS: decreased balance, decreased strength, decreased safety awareness, impaired flexibility, impaired  sensation, and postural dysfunction.   ACTIVITY LIMITATIONS: stairs  PARTICIPATION LIMITATIONS: yard work  PERSONAL FACTORS: Age, Behavior pattern, Past/current experiences, and 3+ comorbidities: L THA, 10/06/21, peripheral neuropathy, Breast CA 2017, anxiety, depression, DVT R popliteal, DM, TIA 2012,  are also affecting patient's functional outcome.   REHAB POTENTIAL: Excellent  CLINICAL DECISION MAKING: Evolving/moderate complexity  EVALUATION COMPLEXITY: Moderate   GOALS: Goals reviewed with patient? Yes  SHORT TERM GOALS: Target date: 06/07/2022   Patient will be independent with initial HEP. Baseline:  Goal status: IN PROGRESS  2.  DGI and/or FGA to be assessed by 05/24/22. Baseline:  Goal status: IN PROGRESS  3.  Patient will be educated on strategies to decrease risk of falls.  Baseline:  Goal status: IN PROGRESS  LONG TERM GOALS: Target date: 07/05/2022   Patient will be independent with advanced/ongoing HEP to improve outcomes and carryover.  Baseline:  Goal status: IN PROGRESS  2.  Patient will be able perform sit to stand transfer without UE support to ease commode transfers. Baseline:  Goal status: IN PROGRESS  3.  Patient will be able to step up/down curb safely with LRAD for safety with community ambulation.  Baseline:  Goal status: IN PROGRESS   4.  Patient will demonstrate improved functional LE strength as demonstrated by improved 5xSTS to <= 12.6 seconds (norm for age). Baseline: 19.83 sec Goal status: IN PROGRESS  5.  Patient will demonstrate at least 23/30 on FGA to improve gait stability and reduce risk for falls. Baseline: 19/30 (05/18/22) Goal status: IN PROGRESS  6.  Patient will score 56/56 on Berg Balance test  to demonstrate lower risk of falls.  Baseline: 47 Goal status: IN PROGRESS  7.  Patient will be able to perform floor to stand transfer without use of support other than UEs. Baseline:  Goal status: IN PROGRESS  PLAN:  PT  FREQUENCY: 2x/week  PT DURATION: 8 weeks  PLANNED INTERVENTIONS: Therapeutic exercises, Therapeutic activity, Neuromuscular re-education, Balance training, Gait training, Patient/Family education, Self Care, Joint mobilization, Spinal mobilization, Cryotherapy, Moist heat, and Manual therapy.  PLAN FOR NEXT SESSION: Work on balance, neutral COG, LE functional strength steps, sit to stand and floor to stand transfers. Review fall prevention techniques PRN.   Artist Pais, PTA 05/23/2022, 12:04 PM

## 2022-05-25 ENCOUNTER — Ambulatory Visit: Payer: Medicare Other | Admitting: Physical Therapy

## 2022-05-25 ENCOUNTER — Encounter: Payer: Self-pay | Admitting: Physical Therapy

## 2022-05-25 DIAGNOSIS — M5459 Other low back pain: Secondary | ICD-10-CM

## 2022-05-25 DIAGNOSIS — M6281 Muscle weakness (generalized): Secondary | ICD-10-CM

## 2022-05-25 DIAGNOSIS — M25552 Pain in left hip: Secondary | ICD-10-CM | POA: Diagnosis not present

## 2022-05-25 DIAGNOSIS — R2681 Unsteadiness on feet: Secondary | ICD-10-CM | POA: Diagnosis not present

## 2022-05-25 DIAGNOSIS — M545 Low back pain, unspecified: Secondary | ICD-10-CM | POA: Diagnosis not present

## 2022-05-25 NOTE — Therapy (Signed)
OUTPATIENT PHYSICAL THERAPY TREATMENT   Patient Name: Marissa Hall MRN: [id number] DOB:1940/10/26, 81 y.o., female Today's Date: 05/25/2022  END OF SESSION:  PT End of Session - 05/25/22 1101     Visit Number 4    Date for PT Re-Evaluation 07/05/22    Authorization Type MCR    Progress Note Due on Visit 10    PT Start Time 1101    PT Stop Time 1142    PT Time Calculation (min) 41 min    Activity Tolerance Patient tolerated treatment well    Behavior During Therapy [facility] for tasks assessed/performed              Past Medical History:  Diagnosis Date   Acute DVT (deep venous thrombosis) ([place]) 04/23/2019   Right popliteal   Alcoholism ([place])    Anxiety and depression 1964   oncologist started duloxetine 12/2016   Breast cancer ([place]) 03/14/2016   Clinical stage 2A: (triple neg): Right breast, upper inner quadrant, 03/2016.  Neoadjuvant chemo x 5 cycles,lumpectomy 4 mo later, then RT started 10/2016.  Adjuvant Xeloda [id number].  SWOG research trial pt 04/2017--pt randomized to pembrolizumab immunotherapy.  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  A1c Mar 12, 2018 was 7.0% at Dr. Shirlyn Goltz.    Epidermoid cyst of vulva    Chronic epidermoid cyst of the vulva.  Excision done 02/2019   GERD (gastroesophageal reflux disease) 2013   History of basal cell cancer    L ankle   Hyperlipidemia 1986   Hypertension 2008   2022 ->addition of hctz to lisinopril led to 28m drop in GFR. HCTZ d/cd.   Hypothyroidism 1988   Diagnosed in her 488s  Managed by Endocrinologist   Lumbar radiculopathy 04/2019   Dr. STamala Julianto get  plain films of LB and hip (considering MRI due to her hx of cancer)   Nonischemic cardiomyopathy ([place]    Hx of takotsubo CM   Osteoporosis 2015   pt states "osteopenia", but then says that she refused to take the rx med for this condition, so I suspect she had osteoporosis.   Peripheral neuropathy 2017   Patient states diabetic neuropathy in feet prior to starting chemotherapy and then worsened by chemo.    SCC (squamous cell carcinoma), arm, right    Syncope and collapse    05/2021 while in [place]  No prodrome.  Monitor ordered and cardiology referral ordered 05/14/21   TIA (transient ischemic attack) 03/26/2011   2012: question of (HA + R eye "floaters"). CT in ED neg acute. Not admitted, no f/u testing done.  ?ocular migraine?   Past Surgical History:  Procedure Laterality Date   ABDOMINAL HYSTERECTOMY  1972   APPENDECTOMY  1972   BREAST ENHANCEMENT SURGERY  1982   BREAST IMPLANT REMOVAL Right 09/13/2016   Procedure: REMOVAL RIGHT BREAST IMPLANT;  Surgeon: BIrene Limbo MD;  [place]  Service: Plastics;  Laterality: Right;   BREAST LUMPECTOMY WITH RADIOACTIVE SEED AND SENTINEL LYMPH NODE BIOPSY Right 09/13/2016   Procedure: RIGHT BREAST LUMPECTOMY WITH RADIOACTIVE SEED X 2 AND SENTINEL LYMPH NODE BIOPSY;  Surgeon:  Alphonsa Overall, MD;  Location: [place];  Service: General;  Laterality: Right;   BREAST SURGERY Right 03/14/2016   Biopsy   CAPSULECTOMY Right 09/13/2016   Procedure: RIGHT CAPSULECTOMY;  Surgeon: Irene Limbo, MD;  Location: [place];  Service: Plastics;  Laterality: Right;   CATARACT EXTRACTION, BILATERAL Bilateral 08/10/17 right eye, 08/31/17 left eye   MASS EXCISION Left 02/28/2018   Path: benign.  Procedure: EXCISIONLEFT MEDIAL THIGH MASS ERAS PATHWAY;  Surgeon: Erroll Luna, MD;  Location: [place];  Service: General;  Laterality: Left;   PORTACATH PLACEMENT N/A 03/15/2016    Procedure: INSERTION PORT-A-CATH WITH [place];  Surgeon: Alphonsa Overall, MD;  Location: [facility];  Service: General;  Laterality: N/A;   PORTACATH REMOVAL  07/2017   SLEEP STUDY  09/2021   NO SLEEP APNEA   surgical repair left ankle Left 2009   s/p [place]   Age 33   TOTAL HIP ARTHROPLASTY Left 10/06/2021   Procedure: TOTAL HIP ARTHROPLASTY ANTERIOR APPROACH;  Surgeon: [name], MD;  Location: [facility];  Service: Orthopedics;  Laterality: Left;   US CAROTID DOPPLER BILATERAL ([place] HX)  01/2021   <50% bilat int carotid sten, otherwise normal.   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   Closed fracture of proximal end of left femur, initial encounter ([place]) 10/06/2021   Hyperkalemia 10/06/2021   Alcohol use 10/06/2021   Malnutrition of moderate degree 10/06/2021   Syncope and collapse 06/10/2021   Closed fracture of multiple ribs of right side 04/30/2021   Acquired hypothyroidism 02/23/2021   Type 2 diabetes mellitus with diabetic polyneuropathy, without long-term current use of insulin ([place]) 02/23/2021   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   Diabetes mellitus type 2 without retinopathy ([place]) 09/21/2020   Meibomian gland dysfunction (MGD) of both eyes 09/21/2020   History of COVID-19 06/23/2020   COVID-19 virus infection 05/27/2020   Piriformis syndrome of right side 05/16/2019   Lower extremity pain, right 04/23/2019   DVT (deep venous thrombosis) ([place]) 04/23/2019   Lumbar radiculopathy, right 04/02/2019   Cataracts, bilateral 07/2017   Other long term (current) drug therapy 06/14/2017   Anxiety and depression    History of breast cancer 03/02/2017   History of therapeutic radiation 03/02/2017   Breast pain, right 10/03/2016   Type 2 diabetes mellitus ([place]) 08/23/2016   Hypertension associated with diabetes ([place]) 08/23/2016   Hypothyroidism 08/23/2016   Chemotherapy-induced neuropathy ([place]) 07/04/2016    Port catheter in place 05/09/2016   Peripheral neuropathy 05/09/2016   Breast cancer of upper-inner quadrant of right female breast ([place]) 03/09/2016   Major depressive disorder, recurrent ([place]) 01/75/1025   Uncomplicated alcohol dependence ([place]) 02/19/2014   Osteoporosis 2015   GERD (gastroesophageal reflux disease) 2013   TIA (transient ischemic attack) 03/26/2011   Epidermoid cyst of vulva 2008   Hyperlipidemia associated with type 2 diabetes mellitus ([place]) 1986    PCP: Tammi Sou, MD  REFERRING PROVIDER: [name], MD  REFERRING DIAG:  M54.50 (ICD-10-CM) - Acute midline low back pain, unspecified whether sciatica present  M25.552 (ICD-10-CM) - Pain in left hip   RATIONALE FOR EVALUATION AND TREATMENT: Rehabilitation  THERAPY DIAG:  Muscle weakness (generalized)  Unsteadiness on feet  Other low back pain  ONSET DATE: this year since right before hip surgery  NEXT MD VISIT: around Feb   SUBJECTIVE:  SUBJECTIVE STATEMENT: Pt reports very mild discomfort in her hip today.  PAIN:  Are you having pain? Yes: NPRS scale: 1/10 Pain location: L hip Pain description: discomfort  PERTINENT HISTORY:  L THA, 10/06/21, peripheral neuropathy, Breast CA 2017, anxiety, depression, DVT R popliteal, DM, TIA 2012, 2 heart caths for broken heart disease  PRECAUTIONS: Fall  WEIGHT BEARING RESTRICTIONS: No  FALLS:  Has patient fallen in last 6 months? Yes. Number of falls 2 .September tripped over a painter's cloth, also trash bin started rolling and she couldn't keep up with it  LIVING ENVIRONMENT: Lives with: lives alone Lives in: House/apartment Stairs: Yes: Internal: 13 steps; walks around house to basement rather than use stairs. 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  LOWER EXTREMITY ROM:    WNL except bil hip IR L 24 deg, R 27 deg  LOWER EXTREMITY MMT:    MMT Right eval Left eval  Hip flexion 4 4-  Hip extension 5 5  Hip abduction 5 5  Hip adduction 5 5  Hip internal rotation    Hip external rotation    Knee flexion 5 4+  Knee extension 5 5  Ankle dorsiflexion 5 5   (Blank rows = not tested)  FUNCTIONAL TESTS:  5 times sit to stand: 19.83 seconds 10 meter walk test: 9.72 sec; Gait speed = 3.37 ft/sec (05/18/22) Berg Balance Scale: 47/56 indicates moderate >50% fall risk; cane outdoors Functional gait assessment: 19/30; < 19 = high risk fall (05/18/22)  GAIT: WNL   TODAY'S TREATMENT:                                                                                                                              DATE:    05/25/22 THERAPEUTIC EXERCISE: to improve flexibility, strength and mobility.  Verbal and tactile cues throughout for technique.  Rec bike - L2 x 6 min R/L fwd step-ups to 6" step x 10 each LE R/L fwd step-up & over 6" step x 10 leading with each LE B lateral step-up & down on 6" step x 10 R/L SLS with opp UE/LE reach (UE sliding out along counter) x 10 - pt c/o dizziness initially which improved with cues to focus on visual target  NEUROMUSCULAR  RE-EDUCATION:  To improve balance, proprioception, coordination, and reduce fall risk. Toe clears from Airex pad to 9" stool x 20 intermittent single UE support on counter Tandem stepping fwd & back along Airex balance beam x 8 passes B side-stepping along Airex balance beam x 8 passes Alt fwd midline step with cross-body reach to cone atop 36" FR x 10 Alt fwd midline step to blue foam oval with cross-body reach to cone atop 36" FR x 10   05/23/22 Nustep L4x3mn  NEUROMUSCULAR RE-EDUCATION: To improve balance, proprioception, and reduce fall risk. Corner balance: Tandem stance 1 trial EO x 30 sec; 2nd trial EC (multiple attempts to maintain balance on walls and postural swaying) Walking with horizontal and vertical head turns 2x170 ft Walking with stop and turn around 1x170 ft Trunk rotation with D2 flexion bil standing on airex x 10 Step downs and back up from airex 10x bil   05/18/22 THERAPEUTIC ACTIVITIES: 10MWT: 9.72 sec Gait speed: 3.37 ft/sec FGA = 19/30; < 19 = high risk fall  Sit to stand x 10 - cues for controlled descent  SELF CARE: Reviewed Check for Safety - Home Fall Prevention Checklist for Older Adults with patient to help identify hazards in home that may contribute to increased fall risk and discussed appropriate modifications or strategies to reduce fall risk in home.  NEUROMUSCULAR RE-EDUCATION: To improve balance, proprioception, and reduce fall risk. Corner balance progression with 1/2 tandem stance and tandem stance on firm surface with arms crossed on chest            Eyes open: - static stance x 30 sec in both tandem and 1/2 tandem, remainder of activities in 1/2 tandem - horiz head turns x 5 - vertical head nods x 5 - trunk rotation x 5 Eyes closed: - static stance x 10 sec in 1/2 tandem   PATIENT EDUCATION:  Education details: HEP update - standing trunk rotations and head turns Person educated: Patient Education method: Explanation and  Handouts Education comprehension: verbalized understanding  HOME EXERCISE PROGRAM: Access Code: [email or web address] URL: [email or web address] Date: 05/25/2022 Prepared by: JAnnie Paras Exercises - Sit to Stand  - 3 x daily - 7 x weekly - 1-2 sets - 5-10 reps - Tandem Stance with Support  - 2 x daily - 7 x weekly - 1 sets - 5 reps - max hold - Standing Shoulder PNF D2 with Trunk Rotation  - 2 x daily - 7 x weekly - 2 sets - 10 reps - Standing with Head Rotation  - 2 x daily - 7 x weekly - 1-2 sets - 5-10 reps  Patient Education - Check for Safety   ASSESSMENT:  CLINICAL IMPRESSION: BLyriqcontinues to note instability when negotiating stairs, esp when she has to look down. Worked on stepping strategies with stairs and SLS utilizing fwd visual target and only occasional downward glances which seemed to reduce dizziness. Increased balance difficulty noted with stepping patterns involving cross body activity, along with issues related to coordination of movement patterns. Also progressed activities on uneven surfaces with good tolerance but intermittent need for UE support necessary. BRaymewill continue to benefit from skilled PT to address above deficits to improve mobility and functional activity tolerance with improved balance.  OBJECTIVE IMPAIRMENTS: decreased balance, decreased strength, decreased safety awareness, impaired flexibility, impaired sensation, and postural dysfunction.   ACTIVITY LIMITATIONS: stairs  PARTICIPATION LIMITATIONS: yard work  PERSONAL FACTORS: Age, Behavior pattern, Past/current experiences, and 3+ comorbidities: L THA, 10/06/21, peripheral neuropathy, Breast CA  2017, anxiety, depression, DVT R popliteal, DM, TIA 2012,  are also affecting patient's functional outcome.   REHAB POTENTIAL: Excellent  CLINICAL DECISION MAKING: Evolving/moderate complexity  EVALUATION COMPLEXITY: Moderate   GOALS: Goals reviewed with patient? Yes  SHORT TERM GOALS:  Target date: 06/07/2022   Patient will be independent with initial HEP. Baseline:  Goal status: MET  05/25/22  2.  DGI and/or FGA to be assessed by 05/24/22. Baseline:  Goal status: MET  05/18/22  3.  Patient will be educated on strategies to decrease risk of falls.  Baseline:  Goal status: MET  05/18/22  LONG TERM GOALS: Target date: 07/05/2022   Patient will be independent with advanced/ongoing HEP to improve outcomes and carryover.  Baseline:  Goal status: IN PROGRESS  2.  Patient will be able perform sit to stand transfer without UE support to ease commode transfers. Baseline:  Goal status: IN PROGRESS  3.  Patient will be able to step up/down curb safely with LRAD for safety with community ambulation.  Baseline:  Goal status: IN PROGRESS   4.  Patient will demonstrate improved functional LE strength as demonstrated by improved 5xSTS to <= 12.6 seconds (norm for age). Baseline: 19.83 sec Goal status: IN PROGRESS  5.  Patient will demonstrate at least 23/30 on FGA to improve gait stability and reduce risk for falls. Baseline: 19/30 (05/18/22) Goal status: IN PROGRESS  6.  Patient will score 56/56 on Berg Balance test to demonstrate lower risk of falls.  Baseline: 47 Goal status: IN PROGRESS  7.  Patient will be able to perform floor to stand transfer without use of support other than UEs. Baseline:  Goal status: IN PROGRESS  PLAN:  PT FREQUENCY: 2x/week  PT DURATION: 8 weeks  PLANNED INTERVENTIONS: Therapeutic exercises, Therapeutic activity, Neuromuscular re-education, Balance training, Gait training, Patient/Family education, Self Care, Joint mobilization, Spinal mobilization, Cryotherapy, Moist heat, and Manual therapy.  PLAN FOR NEXT SESSION: Work on balance, neutral COG, LE functional strength steps, sit to stand and floor to stand transfers. Review fall prevention techniques PRN.   Percival Spanish, PT 05/25/2022, 1:50 PM

## 2022-06-02 ENCOUNTER — Other Ambulatory Visit: Payer: Self-pay | Admitting: Family Medicine

## 2022-06-07 ENCOUNTER — Ambulatory Visit: Payer: Medicare Other | Attending: Family Medicine

## 2022-06-07 DIAGNOSIS — R42 Dizziness and giddiness: Secondary | ICD-10-CM | POA: Insufficient documentation

## 2022-06-07 DIAGNOSIS — M5459 Other low back pain: Secondary | ICD-10-CM | POA: Insufficient documentation

## 2022-06-07 DIAGNOSIS — R2681 Unsteadiness on feet: Secondary | ICD-10-CM | POA: Diagnosis not present

## 2022-06-07 DIAGNOSIS — M6281 Muscle weakness (generalized): Secondary | ICD-10-CM | POA: Diagnosis not present

## 2022-06-07 DIAGNOSIS — Z9181 History of falling: Secondary | ICD-10-CM | POA: Diagnosis not present

## 2022-06-07 NOTE — Therapy (Signed)
OUTPATIENT PHYSICAL THERAPY TREATMENT   Patient Name: Marissa Hall MRN: [id number] DOB:03-Jun-1941, 82 y.o., female Today's Date: 06/07/2022  END OF SESSION:  PT End of Session - 06/07/22 1431     Visit Number 5    Date for PT Re-Evaluation 07/05/22    Authorization Type MCR    Progress Note Due on Visit 10    PT Start Time 1403    PT Stop Time 6270    PT Time Calculation (min) 39 min    Activity Tolerance Patient tolerated treatment well    Behavior During Therapy [facility] for tasks assessed/performed               Past Medical History:  Diagnosis Date   Acute DVT (deep venous thrombosis) ([place]) 04/23/2019   Right popliteal   Alcoholism ([place])    Anxiety and depression 1964   oncologist started duloxetine 12/2016   Breast cancer ([place]) 03/14/2016   Clinical stage 2A: (triple neg): Right breast, upper inner quadrant, 03/2016.  Neoadjuvant chemo x 5 cycles,lumpectomy 4 mo later, then RT started 10/2016.  Adjuvant Xeloda [id number].  SWOG research trial pt 04/2017--pt randomized to pembrolizumab immunotherapy.  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  A1c Mar 12, 2018 was 7.0% at Dr. Shirlyn Goltz.    Epidermoid cyst of vulva    Chronic epidermoid cyst of the vulva.  Excision done 02/2019   GERD (gastroesophageal reflux disease) 2013   History of basal cell cancer    L ankle   Hyperlipidemia 1986   Hypertension 2008   2022 ->addition of hctz to lisinopril led to 35m drop in GFR. HCTZ d/cd.   Hypothyroidism 1988   Diagnosed in her 430s  Managed by Endocrinologist   Lumbar radiculopathy 04/2019   Dr. STamala Julianto get  plain films of LB and hip (considering MRI due to her hx of cancer)   [place]    Hx of takotsubo CM   Osteoporosis 2015   pt states "osteopenia", but then says that she refused to take the rx med for this condition, so I suspect she had osteoporosis.   Peripheral neuropathy 2017   Patient states diabetic neuropathy in feet prior to starting chemotherapy and then worsened by chemo.    SCC (squamous cell carcinoma), arm, right    Syncope and collapse    05[place]  No prodrome.  Monitor ordered and cardiology referral ordered 05/14/21   TIA (transient ischemic attack) 03/26/2011   2012: question of (HA + R eye "floaters"). CT in ED neg acute. Not admitted, no f/u testing done.  ?ocular migraine?   Past Surgical History:  Procedure Laterality Date   ABDOMINAL HYSTERECTOMY  1972   APPENDECTOMY  1972   BREAST ENHANCEMENT SURGERY  1982   BREAST IMPLANT REMOVAL Right 09/13/2016   Procedure: REMOVAL RIGHT BREAST IMPLANT;  Surgeon: BIrene Limbo MD;  Location[place]  Service: Plastics;  Laterality: Right;   BREAST LUMPECTOMY WITH RADIOACTIVE SEED AND SENTINEL LYMPH NODE BIOPSY Right 09/13/2016   Procedure: RIGHT BREAST LUMPECTOMY WITH RADIOACTIVE SEED X 2 AND SENTINEL LYMPH NODE BIOPSY;  Surgeon: Alphonsa Overall, MD;  Location: [place];  Service: General;  Laterality: Right;   BREAST SURGERY Right 03/14/2016   Biopsy   CAPSULECTOMY Right 09/13/2016   Procedure: RIGHT CAPSULECTOMY;  Surgeon: Irene Limbo, MD;  Location: [place];  Service: Plastics;  Laterality: Right;   CATARACT EXTRACTION, BILATERAL Bilateral 08/10/17 right eye, 08/31/17 left eye   MASS EXCISION Left 02/28/2018   Path: benign.  Procedure: EXCISIONLEFT MEDIAL THIGH MASS ERAS PATHWAY;  Surgeon: Erroll Luna, MD;  Location: [place];  Service: General;  Laterality: Left;   PORTACATH PLACEMENT N/A 03/15/2016    Procedure: INSERTION PORT-A-CATH WITH [place];  Surgeon: Alphonsa Overall, MD;  Location: [facility];  Service: General;  Laterality: N/A;   PORTACATH REMOVAL  07/2017   SLEEP STUDY  09/2021   NO SLEEP APNEA   surgical repair left ankle Left 2009   s/p [place]   Age 45   TOTAL HIP ARTHROPLASTY Left 10/06/2021   Procedure: TOTAL HIP ARTHROPLASTY ANTERIOR APPROACH;  Surgeon: [name], MD;  Location: [facility];  Service: Orthopedics;  Laterality: Left;   US CAROTID DOPPLER BILATERAL ([place] HX)  01/2021   <50% bilat int carotid sten, otherwise normal.   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   Closed fracture of proximal end of left femur, initial encounter ([place]) 10/06/2021   Hyperkalemia 10/06/2021   Alcohol use 10/06/2021   Malnutrition of moderate degree 10/06/2021   Syncope and collapse 06/10/2021   Closed fracture of multiple ribs of right side 04/30/2021   Acquired hypothyroidism 02/23/2021   Type 2 diabetes mellitus with diabetic polyneuropathy, without long-term current use of insulin ([place]) 02/23/2021   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   Diabetes mellitus type 2 without retinopathy ([place]) 09/21/2020   Meibomian gland dysfunction (MGD) of both eyes 09/21/2020   History of COVID-19 06/23/2020   COVID-19 virus infection 05/27/2020   Piriformis syndrome of right side 05/16/2019   Lower extremity pain, right 04/23/2019   DVT (deep venous thrombosis) ([place]) 04/23/2019   Lumbar radiculopathy, right 04/02/2019   Cataracts, bilateral 07/2017   Other long term (current) drug therapy 06/14/2017   Anxiety and depression    History of breast cancer 03/02/2017   History of therapeutic radiation 03/02/2017   Breast pain, right 10/03/2016   Type 2 diabetes mellitus ([place]) 08/23/2016   Hypertension associated with diabetes ([place]) 08/23/2016   Hypothyroidism 08/23/2016   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/04/2016    Port catheter in place 05/09/2016   Peripheral neuropathy 05/09/2016   Breast cancer of upper-inner quadrant of right female breast ([place]) 03/09/2016   Major depressive disorder, recurrent ([place]) 94/70/9628   Uncomplicated alcohol dependence ([place]) 02/19/2014   Osteoporosis 2015   GERD (gastroesophageal reflux disease) 2013   TIA (transient ischemic attack) 03/26/2011   Epidermoid cyst of vulva 2008   Hyperlipidemia associated with type 2 diabetes mellitus ([place]) 1986    PCP: Tammi Sou, MD  REFERRING PROVIDER: [name], MD  REFERRING DIAG:  M54.50 (ICD-10-CM) - Acute midline low back pain, unspecified whether sciatica present  M25.552 (ICD-10-CM) - Pain in left hip   RATIONALE FOR EVALUATION AND TREATMENT: Rehabilitation  THERAPY DIAG:  Muscle weakness (generalized)  Unsteadiness on feet  Other low back pain  ONSET DATE: this year since right before hip surgery  NEXT MD VISIT: around Feb   SUBJECTIVE:  SUBJECTIVE STATEMENT: No pain today, was sick for the past 2 weeks with sinus issues, feeling better now.  PAIN:  Are you having pain? Yes: NPRS scale: 0/10 Pain location: L hip Pain description: discomfort  PERTINENT HISTORY:  L THA, 10/06/21, peripheral neuropathy, Breast CA 2017, anxiety, depression, DVT R popliteal, DM, TIA 2012, 2 heart caths for broken heart disease  PRECAUTIONS: Fall  WEIGHT BEARING RESTRICTIONS: No  FALLS:  Has patient fallen in last 6 months? Yes. Number of falls 2 .September tripped over a painter's cloth, also trash bin started rolling and she couldn't keep up with it  LIVING ENVIRONMENT: Lives with: lives alone Lives in: House/apartment Stairs: Yes: Internal: 13 steps; walks around house to basement rather than use stairs. 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  LOWER EXTREMITY ROM:    WNL except bil hip IR L 24 deg, R 27 deg  LOWER EXTREMITY MMT:    MMT Right eval Left eval  Hip flexion 4 4-  Hip extension 5 5  Hip abduction 5 5  Hip adduction 5 5  Hip internal rotation    Hip external rotation    Knee flexion 5 4+  Knee extension 5 5  Ankle dorsiflexion 5 5   (Blank rows = not tested)  FUNCTIONAL TESTS:  5 times sit to stand: 19.83 seconds 10 meter walk test: 9.72 sec; Gait speed = 3.37 ft/sec (05/18/22) Berg Balance Scale: 47/56 indicates moderate >50% fall risk; cane outdoors Functional gait assessment: 19/30; < 19 = high risk fall (05/18/22)  GAIT: WNL   TODAY'S TREATMENT:                                                                                                                              DATE:    06/07/22 THERAPEUTIC EXERCISE: to improve flexibility, strength and mobility.  Verbal and tactile cues throughout for technique.  Rec bike - L2 x 6 min STS x 10 no UE support Fwd step ups 2x10 BLE 6' step  Neuro Re-ed: Trunk rotation on airex x 10 bil D2 flexion bil on airex x 10 both directions Toe taps on 9' stool standing on airex x 10 bil Gait dynamic balance with head turns horizontal and turning around  to look behind  Clock balance 1/2 circle pattern 5x R/L with ski pole for balance; Focused on posterior and posterior lateral stepping with ski pole x 10 bil  05/25/22 THERAPEUTIC EXERCISE: to improve flexibility, strength and mobility.  Verbal and tactile cues throughout for technique.  Rec bike - L2 x 6 min R/L fwd step-ups to 6" step x 10 each LE R/L fwd step-up & over 6" step x 10 leading with each LE B lateral step-up & down on 6" step x 10 R/L SLS with opp UE/LE reach (UE sliding out along counter) x 10 - pt c/o dizziness initially which improved with cues to focus on visual target  NEUROMUSCULAR RE-EDUCATION: To improve balance, proprioception, coordination, and reduce fall risk. Toe clears from Airex pad to 9" stool x 20 intermittent single UE support on counter Tandem stepping fwd & back along Airex balance beam x 8 passes B side-stepping along Airex balance beam x 8 passes Alt fwd midline step with cross-body reach to cone atop 36" FR x 10 Alt fwd midline step to blue foam oval with cross-body reach to cone atop 36" FR x 10   05/23/22 Nustep [id number]  NEUROMUSCULAR RE-EDUCATION: To improve balance, proprioception, and reduce fall risk. Corner balance: Tandem stance 1 trial EO x 30 sec; 2nd trial EC (multiple attempts to maintain balance on walls and postural swaying) Walking with horizontal and vertical head turns 2x170 ft Walking with stop and turn around 1x170 ft Trunk rotation with D2 flexion bil standing on airex x 10 Step downs and back up from airex 10x bil   05/18/22 THERAPEUTIC ACTIVITIES: 10MWT: 9.72 sec Gait speed: 3.37 ft/sec FGA = 19/30; < 19 = high risk fall  Sit to stand x 10 - cues for controlled descent  SELF CARE: Reviewed Check for Safety - Home Fall Prevention Checklist for Older Adults with patient to help identify hazards in home that may contribute to increased fall risk and discussed appropriate modifications or strategies to reduce fall risk in  home.  NEUROMUSCULAR RE-EDUCATION: To improve balance, proprioception, and reduce fall risk. Corner balance progression with 1/2 tandem stance and tandem stance on firm surface with arms crossed on chest            Eyes open: - static stance x 30 sec in both tandem and 1/2 tandem, remainder of activities in 1/2 tandem - horiz head turns x 5 - vertical head nods x 5 - trunk rotation x 5 Eyes closed: - static stance x 10 sec in 1/2 tandem   PATIENT EDUCATION:  Education details: HEP update - standing trunk rotations and head turns Person educated: Patient Education method: Explanation and Handouts Education comprehension: verbalized understanding  HOME EXERCISE PROGRAM: Access Code: [email or web address] URL: [email or web address] Date: 05/25/2022 Prepared by: JAnnie Paras Exercises - Sit to Stand  - 3 x daily - 7 x weekly - 1-2 sets - 5-10 reps - Tandem Stance with Support  - 2 x daily - 7 x weekly - 1 sets - 5 reps - max hold - Standing Shoulder PNF D2 with Trunk Rotation  - 2 x daily - 7 x weekly - 2 sets - 10 reps - Standing with Head Rotation  - 2 x daily - 7 x weekly - 1-2 sets - 5-10 reps  Patient Education - Check for Safety   ASSESSMENT:  CLINICAL IMPRESSION: Pt reports having a sickness for past couple of weeks so she has not been as active. She tolerated the progression of exercises well today with  no increased pain. She did have some dizziness with the clock balance exercise, which subsided with rest. She showed most difficulty with posterior and post. Lateral WS. Pt showed ability to do STS transfer w/o UE support so she has met LTG #2.  OBJECTIVE IMPAIRMENTS: decreased balance, decreased strength, decreased safety awareness, impaired flexibility, impaired sensation, and postural dysfunction.   ACTIVITY LIMITATIONS: stairs  PARTICIPATION LIMITATIONS: yard work  PERSONAL FACTORS: Age, Behavior pattern, Past/current experiences, and 3+ comorbidities: L THA,  10/06/21, peripheral neuropathy, Breast CA 2017, anxiety, depression, DVT R popliteal, DM, TIA 2012,  are also affecting patient's functional outcome.   REHAB POTENTIAL: Excellent  CLINICAL DECISION MAKING: Evolving/moderate complexity  EVALUATION COMPLEXITY: Moderate   GOALS: Goals reviewed with patient? Yes  SHORT TERM GOALS: Target date: 06/07/2022   Patient will be independent with initial HEP. Baseline:  Goal status: MET  05/25/22  2.  DGI and/or FGA to be assessed by 05/24/22. Baseline:  Goal status: MET  05/18/22  3.  Patient will be educated on strategies to decrease risk of falls.  Baseline:  Goal status: MET  05/18/22  LONG TERM GOALS: Target date: 07/05/2022   Patient will be independent with advanced/ongoing HEP to improve outcomes and carryover.  Baseline:  Goal status: IN PROGRESS  2.  Patient will be able perform sit to stand transfer without UE support to ease commode transfers. Baseline:  Goal status: MET - 06/07/22  3.  Patient will be able to step up/down curb safely with LRAD for safety with community ambulation.  Baseline:  Goal status: IN PROGRESS   4.  Patient will demonstrate improved functional LE strength as demonstrated by improved 5xSTS to <= 12.6 seconds (norm for age). Baseline: 19.83 sec Goal status: IN PROGRESS  5.  Patient will demonstrate at least 23/30 on FGA to improve gait stability and reduce risk for falls. Baseline: 19/30 (05/18/22) Goal status: IN PROGRESS  6.  Patient will score 56/56 on Berg Balance test to demonstrate lower risk of falls.  Baseline: 47 Goal status: IN PROGRESS  7.  Patient will be able to perform floor to stand transfer without use of support other than UEs. Baseline:  Goal status: IN PROGRESS  PLAN:  PT FREQUENCY: 2x/week  PT DURATION: 8 weeks  PLANNED INTERVENTIONS: Therapeutic exercises, Therapeutic activity, Neuromuscular re-education, Balance training, Gait training, Patient/Family education, Self  Care, Joint mobilization, Spinal mobilization, Cryotherapy, Moist heat, and Manual therapy.  PLAN FOR NEXT SESSION: Continue dynamic balance; LE functional strength steps, sit to stand and floor to stand transfers. Review fall prevention techniques PRN.   Artist Pais, PTA 06/07/2022, 2:44 PM

## 2022-06-13 ENCOUNTER — Ambulatory Visit: Payer: Medicare Other

## 2022-06-13 DIAGNOSIS — M5459 Other low back pain: Secondary | ICD-10-CM | POA: Diagnosis not present

## 2022-06-13 DIAGNOSIS — Z9181 History of falling: Secondary | ICD-10-CM | POA: Diagnosis not present

## 2022-06-13 DIAGNOSIS — R42 Dizziness and giddiness: Secondary | ICD-10-CM

## 2022-06-13 DIAGNOSIS — R2681 Unsteadiness on feet: Secondary | ICD-10-CM

## 2022-06-13 DIAGNOSIS — M6281 Muscle weakness (generalized): Secondary | ICD-10-CM | POA: Diagnosis not present

## 2022-06-13 NOTE — Therapy (Signed)
OUTPATIENT PHYSICAL THERAPY TREATMENT   Patient Name: Marissa Hall MRN: [id number] DOB:[birth year], 82 y.o., female Today's Date: 06/13/2022  END OF SESSION:  PT End of Session - 06/13/22 1152     Visit Number 6    Date for PT Re-Evaluation 07/05/22    Authorization Type MCR    Progress Note Due on Visit 10    PT Start Time 1112   pt late   PT Stop Time 1146    PT Time Calculation (min) 34 min    Activity Tolerance Patient tolerated treatment well    Behavior During Therapy [facility] for tasks assessed/performed               Past Medical History:  Diagnosis Date   Acute DVT (deep venous thrombosis) ([place]) 04/23/2019   Right popliteal   Alcoholism ([place])    Anxiety and depression 1964   oncologist started duloxetine 12/2016   Breast cancer ([place]) 03/14/2016   Clinical stage 2A: (triple neg): Right breast, upper inner quadrant, 03/2016.  Neoadjuvant chemo x 5 cycles,lumpectomy 4 mo later, then RT started 10/2016.  Adjuvant Xeloda [id number].  SWOG research trial pt 04/2017--pt randomized to pembrolizumab immunotherapy.  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  A1c Mar 12, 2018 was 7.0% at Dr. Shirlyn Goltz.    Epidermoid cyst of vulva    Chronic epidermoid cyst of the vulva.  Excision done 02/2019   GERD (gastroesophageal reflux disease) 2013   History of basal cell cancer    L ankle   Hyperlipidemia 1986   Hypertension 2008   2022 ->addition of hctz to lisinopril led to 33m drop in GFR. HCTZ d/cd.   Hypothyroidism 1988   Diagnosed in her 446s  Managed by Endocrinologist   Lumbar radiculopathy 04/2019   Dr. STamala Julian to get plain films of LB and hip (considering MRI due to her hx of cancer)   Nonischemic cardiomyopathy ([place]    Hx of takotsubo CM   Osteoporosis 2015   pt states "osteopenia", but then says that she refused to take the rx med for this condition, so I suspect she had osteoporosis.   Peripheral neuropathy 2017   Patient states diabetic neuropathy in feet prior to starting chemotherapy and then worsened by chemo.    SCC (squamous cell carcinoma), arm, right    Syncope and collapse    05[place]2021 [place]  No prodrome.  Monitor ordered and cardiology referral ordered 05/14/21   TIA (transient ischemic attack) 03/26/2011   2012: question of (HA + R eye "floaters"). CT in ED neg acute. Not admitted, no f/u testing done.  ?ocular migraine?   Past Surgical History:  Procedure Laterality Date   ABDOMINAL HYSTERECTOMY  1972   APPENDECTOMY  1972   BREAST ENHANCEMENT SURGERY  1982   BREAST IMPLANT REMOVAL Right 09/13/2016   Procedure: REMOVAL RIGHT BREAST IMPLANT;  Surgeon: BIrene Limbo MD;  [place] [facility]  Service: Plastics;  Laterality: Right;   BREAST LUMPECTOMY WITH RADIOACTIVE SEED AND SENTINEL LYMPH NODE BIOPSY Right 09/13/2016   Procedure: RIGHT BREAST LUMPECTOMY WITH RADIOACTIVE SEED X 2 AND SENTINEL LYMPH  NODE BIOPSY;  Surgeon: Alphonsa Overall, MD;  Location: [place];  Service: General;  Laterality: Right;   BREAST SURGERY Right 03/14/2016   Biopsy   CAPSULECTOMY Right 09/13/2016   Procedure: RIGHT CAPSULECTOMY;  Surgeon: Irene Limbo, MD;  Location: [place];  Service: Plastics;  Laterality: Right;   CATARACT EXTRACTION, BILATERAL Bilateral 08/10/17 right eye, 08/31/17 left eye   MASS EXCISION Left 02/28/2018   Path: benign.  Procedure: EXCISIONLEFT MEDIAL THIGH MASS ERAS PATHWAY;  Surgeon: Erroll Luna, MD;  Location: [employer];  Service: General;  Laterality: Left;   PORTACATH PLACEMENT N/A 03/15/2016    Procedure: INSERTION PORT-A-CATH WITH [place];  Surgeon: Alphonsa Overall, MD;  Location: [facility];  Service: General;  Laterality: N/A;   PORTACATH REMOVAL  07/2017   SLEEP STUDY  09/2021   NO SLEEP APNEA   surgical repair left ankle Left 2009   s/p [place]   Age 54   TOTAL HIP ARTHROPLASTY Left 10/06/2021   Procedure: TOTAL HIP ARTHROPLASTY ANTERIOR APPROACH;  Surgeon: [name], MD;  Location: [facility];  Service: Orthopedics;  Laterality: Left;   US CAROTID DOPPLER BILATERAL ([place] [facility])  01/2021   <50% bilat int carotid sten, otherwise normal.   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   Closed fracture of proximal end of left femur, initial encounter ([place]) 10/06/2021   Hyperkalemia 10/06/2021   Alcohol use 10/06/2021   Malnutrition of moderate degree 10/06/2021   Syncope and collapse 06/10/2021   Closed fracture of multiple ribs of right side 04/30/2021   Acquired hypothyroidism 02/23/2021   Type 2 diabetes mellitus with diabetic polyneuropathy, without long-term current use of insulin ([place]) 02/23/2021   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   Diabetes mellitus type 2 without retinopathy ([place]) 09/21/2020   Meibomian gland dysfunction (MGD) of both eyes 09/21/2020   History of COVID-19 06/23/2020   COVID-19 virus infection 05/27/2020   Piriformis syndrome of right side 05/16/2019   Lower extremity pain, right 04/23/2019   DVT (deep venous thrombosis) ([place]) 04/23/2019   Lumbar radiculopathy, right 04/02/2019   Cataracts, bilateral 07/2017   Other long term (current) drug therapy 06/14/2017   Anxiety and depression    History of breast cancer 03/02/2017   History of therapeutic radiation 03/02/2017   Breast pain, right 10/03/2016   Type 2 diabetes mellitus ([place]) 08/23/2016   Hypertension associated with diabetes ([place]) 08/23/2016   Hypothyroidism 08/23/2016   Chemotherapy-induced neuropathy ([place])  07/04/2016   Port catheter in place 05/09/2016   Peripheral neuropathy 05/09/2016   Breast cancer of upper-inner quadrant of right female breast ([place]) 03/09/2016   Major depressive disorder, recurrent ([place]) 96/29/5284   Uncomplicated alcohol dependence ([place]) 02/19/2014   Osteoporosis 2015   GERD (gastroesophageal reflux disease) 2013   TIA (transient ischemic attack) 03/26/2011   Epidermoid cyst of vulva 2008   Hyperlipidemia associated with type 2 diabetes mellitus ([place]) 1986    PCP: Tammi Sou, MD  REFERRING PROVIDER: [name], MD  REFERRING DIAG:  M54.50 (ICD-10-CM) - Acute midline low back pain, unspecified whether sciatica present  M25.552 (ICD-10-CM) - Pain in left hip   RATIONALE FOR EVALUATION AND TREATMENT: Rehabilitation  THERAPY DIAG:  Muscle weakness (generalized)  Unsteadiness on feet  Other low back pain  Dizziness and giddiness  History of falling  ONSET DATE: this year since right before hip surgery  NEXT MD VISIT: around Feb   SUBJECTIVE:                                                                                                                                                                                           SUBJECTIVE STATEMENT: No new complaints.  PAIN:  Are you having pain? No  PERTINENT HISTORY:  L THA, 10/06/21, peripheral neuropathy, Breast CA 2017, anxiety, depression, DVT R popliteal, DM, TIA 2012, 2 heart caths for broken heart disease  PRECAUTIONS: Fall  WEIGHT BEARING RESTRICTIONS: No  FALLS:  Has patient fallen in last 6 months? Yes. Number of falls 2 .September tripped over a painter's cloth, also trash bin started rolling and she couldn't keep up with it  LIVING ENVIRONMENT: Lives with: lives alone Lives in: House/apartment Stairs: Yes: Internal: 13 steps; walks around house to basement rather than use stairs. 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  LOWER EXTREMITY ROM:    WNL except bil hip IR L 24 deg, R 27 deg  LOWER EXTREMITY MMT:    MMT Right eval Left eval  Hip flexion 4 4-  Hip extension 5 5  Hip abduction 5 5  Hip adduction 5 5  Hip internal rotation    Hip external rotation    Knee flexion 5 4+  Knee extension 5 5  Ankle dorsiflexion 5 5   (Blank rows = not tested)  FUNCTIONAL TESTS:  5 times sit to stand: 19.83 seconds 10 meter walk test: 9.72 sec; Gait speed = 3.37 ft/sec (05/18/22) Berg Balance Scale: 47/56 indicates moderate >50% fall risk; cane outdoors Functional gait assessment: 19/30; < 19 = high risk fall (05/18/22)  GAIT: WNL   TODAY'S TREATMENT:  DATE:    06/13/22 THERAPEUTIC EXERCISE: to improve flexibility, strength and mobility.  Verbal and tactile cues throughout for technique.  Gait for warm up  Step ups 6' 2x10 bil Lateral step ups 6' 2x10 bil Functional squats x 10 with bil UE support STS 2x10 with yellow weight ball  Standing hip abduction, extension, flexion x 10 bil YTB at counter  06/07/22 THERAPEUTIC EXERCISE: to improve flexibility, strength and mobility.  Verbal and tactile cues throughout for technique.  Rec bike - L2 x 6  min STS x 10 no UE support Fwd step ups 2x10 BLE 6' step  Neuro Re-ed: Trunk rotation on airex x 10 bil D2 flexion bil on airex x 10 both directions Toe taps on 9' stool standing on airex x 10 bil Gait dynamic balance with head turns horizontal and turning around to look behind  Clock balance 1/2 circle pattern 5x R/L with ski pole for balance; Focused on posterior and posterior lateral stepping with ski pole x 10 bil  05/25/22 THERAPEUTIC EXERCISE: to improve flexibility, strength and mobility.  Verbal and tactile cues throughout for technique.  Rec bike - L2 x 6 min R/L fwd step-ups to 6" step x 10 each LE R/L fwd step-up & over 6" step x 10 leading with each LE B lateral step-up & down on 6" step x 10 R/L SLS with opp UE/LE reach (UE sliding out along counter) x 10 - pt c/o dizziness initially which improved with cues to focus on visual target  NEUROMUSCULAR RE-EDUCATION: To improve balance, proprioception, coordination, and reduce fall risk. Toe clears from Airex pad to 9" stool x 20 intermittent single UE support on counter Tandem stepping fwd & back along Airex balance beam x 8 passes B side-stepping along Airex balance beam x 8 passes Alt fwd midline step with cross-body reach to cone atop 36" FR x 10 Alt fwd midline step to blue foam oval with cross-body reach to cone atop 36" FR x 10   05/23/22 Nustep [id number]  NEUROMUSCULAR RE-EDUCATION: To improve balance, proprioception, and reduce fall risk. Corner balance: Tandem stance 1 trial EO x 30 sec; 2nd trial EC (multiple attempts to maintain balance on walls and postural swaying) Walking with horizontal and vertical head turns 2x170 ft Walking with stop and turn around 1x170 ft Trunk rotation with D2 flexion bil standing on airex x 10 Step downs and back up from airex 10x bil   05/18/22 THERAPEUTIC ACTIVITIES: 10MWT: 9.72 sec Gait speed: 3.37 ft/sec FGA = 19/30; < 19 = high risk fall  Sit to stand x 10 - cues for  controlled descent  SELF CARE: Reviewed Check for Safety - Home Fall Prevention Checklist for Older Adults with patient to help identify hazards in home that may contribute to increased fall risk and discussed appropriate modifications or strategies to reduce fall risk in home.  NEUROMUSCULAR RE-EDUCATION: To improve balance, proprioception, and reduce fall risk. Corner balance progression with 1/2 tandem stance and tandem stance on firm surface with arms crossed on chest            Eyes open: - static stance x 30 sec in both tandem and 1/2 tandem, remainder of activities in 1/2 tandem - horiz head turns x 5 - vertical head nods x 5 - trunk rotation x 5 Eyes closed: - static stance x 10 sec in 1/2 tandem   PATIENT EDUCATION:  Education details: HEP update - standing trunk rotations and head turns Person educated: Patient Education method:  Explanation and Handouts Education comprehension: verbalized understanding  HOME EXERCISE PROGRAM: Access Code: [email or web address] URL: [email or web address] Date: 06/13/2022 Prepared by: Clarene Essex  Exercises - Sit to Stand  - 3 x daily - 7 x weekly - 1-2 sets - 5-10 reps - Tandem Stance with Support  - 2 x daily - 7 x weekly - 1 sets - 5 reps - max hold - Standing Shoulder PNF D2 with Trunk Rotation  - 2 x daily - 7 x weekly - 2 sets - 10 reps - Standing with Head Rotation  - 2 x daily - 7 x weekly - 1-2 sets - 5-10 reps - Standing Hip Abduction with Resistance at Ankles and Counter Support  - 1 x daily - 3-4 x weekly - 2 sets - 10 reps - Standing Hip Extension with Resistance at Ankles and Counter Support  - 1 x daily - 3-4 x weekly - 2 sets - 10 reps - Standing Hip Flexion with Resistance at Ankles and Counter Support  - 1 x daily - 3-4 x weekly - 2 sets - 10 reps  Patient Education - Check for Safety   ASSESSMENT:  CLINICAL IMPRESSION: Pt demonstrated a good participation in today's session with no increased pain. She wants to  continue working on standing up from her toilet with no UE support so we focused on strengthening the muscles that helped with this. She became fatigued towards the few reps of STS w/ weighted ball. She had a report of LB aching with the standing hip exercises but with contracting her abdominals this eased the sensation.  OBJECTIVE IMPAIRMENTS: decreased balance, decreased strength, decreased safety awareness, impaired flexibility, impaired sensation, and postural dysfunction.   ACTIVITY LIMITATIONS: stairs  PARTICIPATION LIMITATIONS: yard work  PERSONAL FACTORS: Age, Behavior pattern, Past/current experiences, and 3+ comorbidities: L THA, 10/06/21, peripheral neuropathy, Breast CA 2017, anxiety, depression, DVT R popliteal, DM, TIA 2012,  are also affecting patient's functional outcome.   REHAB POTENTIAL: Excellent  CLINICAL DECISION MAKING: Evolving/moderate complexity  EVALUATION COMPLEXITY: Moderate   GOALS: Goals reviewed with patient? Yes  SHORT TERM GOALS: Target date: 06/07/2022   Patient will be independent with initial HEP. Baseline:  Goal status: MET  05/25/22  2.  DGI and/or FGA to be assessed by 05/24/22. Baseline:  Goal status: MET  05/18/22  3.  Patient will be educated on strategies to decrease risk of falls.  Baseline:  Goal status: MET  05/18/22  LONG TERM GOALS: Target date: 07/05/2022   Patient will be independent with advanced/ongoing HEP to improve outcomes and carryover.  Baseline:  Goal status: IN PROGRESS  2.  Patient will be able perform sit to stand transfer without UE support to ease commode transfers. Baseline:  Goal status: MET - 06/07/22  3.  Patient will be able to step up/down curb safely with LRAD for safety with community ambulation.  Baseline:  Goal status: IN PROGRESS   4.  Patient will demonstrate improved functional LE strength as demonstrated by improved 5xSTS to <= 12.6 seconds (norm for age). Baseline: 19.83 sec Goal status: IN  PROGRESS  5.  Patient will demonstrate at least 23/30 on FGA to improve gait stability and reduce risk for falls. Baseline: 19/30 (05/18/22) Goal status: IN PROGRESS  6.  Patient will score 56/56 on Berg Balance test to demonstrate lower risk of falls.  Baseline: 47 Goal status: IN PROGRESS  7.  Patient will be able to perform floor to stand transfer without use of support other than  UEs. Baseline:  Goal status: IN PROGRESS  PLAN:  PT FREQUENCY: 2x/week  PT DURATION: 8 weeks  PLANNED INTERVENTIONS: Therapeutic exercises, Therapeutic activity, Neuromuscular re-education, Balance training, Gait training, Patient/Family education, Self Care, Joint mobilization, Spinal mobilization, Cryotherapy, Moist heat, and Manual therapy.  PLAN FOR NEXT SESSION: Continue dynamic balance; LE functional strength steps, sit to stand and floor to stand transfers. Review fall prevention techniques PRN.   Artist Pais, PTA 06/13/2022, 11:53 AM

## 2022-06-16 ENCOUNTER — Ambulatory Visit: Payer: Medicare Other | Admitting: Physical Therapy

## 2022-06-16 ENCOUNTER — Encounter: Payer: Self-pay | Admitting: Physical Therapy

## 2022-06-16 DIAGNOSIS — M6281 Muscle weakness (generalized): Secondary | ICD-10-CM | POA: Diagnosis not present

## 2022-06-16 DIAGNOSIS — M5459 Other low back pain: Secondary | ICD-10-CM | POA: Diagnosis not present

## 2022-06-16 DIAGNOSIS — R2681 Unsteadiness on feet: Secondary | ICD-10-CM

## 2022-06-16 DIAGNOSIS — Z9181 History of falling: Secondary | ICD-10-CM | POA: Diagnosis not present

## 2022-06-16 DIAGNOSIS — R42 Dizziness and giddiness: Secondary | ICD-10-CM | POA: Diagnosis not present

## 2022-06-16 NOTE — Therapy (Signed)
OUTPATIENT PHYSICAL THERAPY TREATMENT   Patient Name: Marissa Hall MRN: [id number] DOB:09/01/1940, 82 y.o., female Today's Date: 06/16/2022  END OF SESSION:  PT End of Session - 06/16/22 1411     Visit Number 7    Date for PT Re-Evaluation 07/05/22    Authorization Type MCR    Progress Note Due on Visit 10    PT Start Time 9476    PT Stop Time 1446    PT Time Calculation (min) 44 min    Activity Tolerance Patient tolerated treatment well    Behavior During Therapy [facility] for tasks assessed/performed                Past Medical History:  Diagnosis Date   Acute DVT (deep venous thrombosis) ([place]) 04/23/2019   Right popliteal   Alcoholism ([place])    Anxiety and depression 1964   oncologist started duloxetine 12/2016   Breast cancer ([place]) 03/14/2016   Clinical stage 2A: (triple neg): Right breast, upper inner quadrant, 03/2016.  Neoadjuvant chemo x 5 cycles,lumpectomy 4 mo later, then RT started 10/2016.  Adjuvant Xeloda [id number].  SWOG research trial pt 04/2017--pt randomized to pembrolizumab immunotherapy.  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  A1c Mar 12, 2018 was 7.0% at Dr. Shirlyn Goltz.    Epidermoid cyst of vulva    Chronic epidermoid cyst of the vulva.  Excision done 02/2019   GERD (gastroesophageal reflux disease) 2013   History of basal cell cancer    L ankle   Hyperlipidemia 1986   Hypertension 2008   2022 ->addition of hctz to lisinopril led to 45m drop in GFR. HCTZ d/cd.   Hypothyroidism 1988   Diagnosed in her 435s  Managed by Endocrinologist   Lumbar radiculopathy 04/2019   Dr. STamala Julianto get  plain films of LB and hip (considering MRI due to her hx of cancer)   Nonischemic cardiomyopathy ([place]    Hx of takotsubo CM   Osteoporosis 2015   pt states "osteopenia", but then says that she refused to take the rx med for this condition, so I suspect she had osteoporosis.   Peripheral neuropathy 2017   Patient states diabetic neuropathy in feet prior to starting chemotherapy and then worsened by chemo.    SCC (squamous cell carcinoma), arm, right    Syncope and collapse    05[place]2021 [place]  No prodrome.  Monitor ordered and cardiology referral ordered 05/14/21   TIA (transient ischemic attack) 03/26/2011   2012: question of (HA + R eye "floaters"). CT in ED neg acute. Not admitted, no f/u testing done.  ?ocular migraine?   Past Surgical History:  Procedure Laterality Date   ABDOMINAL HYSTERECTOMY  1972   APPENDECTOMY  1972   BREAST ENHANCEMENT SURGERY  1982   BREAST IMPLANT REMOVAL Right 09/13/2016   Procedure: REMOVAL RIGHT BREAST IMPLANT;  Surgeon: BIrene Limbo MD;  [facility]  Service: Plastics;  Laterality: Right;   BREAST LUMPECTOMY WITH RADIOACTIVE SEED AND SENTINEL LYMPH NODE BIOPSY Right 09/13/2016   Procedure: RIGHT BREAST LUMPECTOMY WITH RADIOACTIVE SEED X 2 AND SENTINEL LYMPH NODE BIOPSY;  Surgeon: Alphonsa Overall, MD;  Location: [place];  Service: General;  Laterality: Right;   BREAST SURGERY Right 03/14/2016   Biopsy   CAPSULECTOMY Right 09/13/2016   Procedure: RIGHT CAPSULECTOMY;  Surgeon: Irene Limbo, MD;  Location: [place];  Service: Plastics;  Laterality: Right;   CATARACT EXTRACTION, BILATERAL Bilateral 08/10/17 right eye, 08/31/17 left eye   MASS EXCISION Left 02/28/2018   Path: benign.  Procedure: EXCISIONLEFT MEDIAL THIGH MASS ERAS PATHWAY;  Surgeon: Erroll Luna, MD;  Location: [place];  Service: General;  Laterality: Left;   PORTACATH PLACEMENT N/A 03/15/2016    Procedure: INSERTION PORT-A-CATH WITH [place];  Surgeon: Alphonsa Overall, MD;  Location: [facility];  Service: General;  Laterality: N/A;   PORTACATH REMOVAL  07/2017   SLEEP STUDY  09/2021   NO SLEEP APNEA   surgical repair left ankle Left 2009   s/p [place]   Age 45   TOTAL HIP ARTHROPLASTY Left 10/06/2021   Procedure: TOTAL HIP ARTHROPLASTY ANTERIOR APPROACH;  Surgeon: [name], MD;  Location: [facility];  Service: Orthopedics;  Laterality: Left;   US CAROTID DOPPLER BILATERAL ([place] HX)  01/2021   <50% bilat int carotid sten, otherwise normal.   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   Closed fracture of proximal end of left femur, initial encounter ([place]) 10/06/2021   Hyperkalemia 10/06/2021   Alcohol use 10/06/2021   Malnutrition of moderate degree 10/06/2021   Syncope and collapse 06/10/2021   Closed fracture of multiple ribs of right side 04/30/2021   Acquired hypothyroidism 02/23/2021   Type 2 diabetes mellitus with diabetic polyneuropathy, without long-term current use of insulin ([place]) 02/23/2021   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   Diabetes mellitus type 2 without retinopathy ([place]) 09/21/2020   Meibomian gland dysfunction (MGD) of both eyes 09/21/2020   History of COVID-19 06/23/2020   COVID-19 virus infection 05/27/2020   Piriformis syndrome of right side 05/16/2019   Lower extremity pain, right 04/23/2019   DVT (deep venous thrombosis) ([place]) 04/23/2019   Lumbar radiculopathy, right 04/02/2019   Cataracts, bilateral 07/2017   Other long term (current) drug therapy 06/14/2017   Anxiety and depression    History of breast cancer 03/02/2017   History of therapeutic radiation 03/02/2017   Breast pain, right 10/03/2016   Type 2 diabetes mellitus ([place]) 08/23/2016   Hypertension associated with diabetes ([place]) 08/23/2016   Hypothyroidism 08/23/2016   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/04/2016    Port catheter in place 05/09/2016   Peripheral neuropathy 05/09/2016   Breast cancer of upper-inner quadrant of right female breast ([place]) 03/09/2016   Major depressive disorder, recurrent ([place]) 94/70/9628   Uncomplicated alcohol dependence ([place]) 02/19/2014   Osteoporosis 2015   GERD (gastroesophageal reflux disease) 2013   TIA (transient ischemic attack) 03/26/2011   Epidermoid cyst of vulva 2008   Hyperlipidemia associated with type 2 diabetes mellitus ([place]) 1986    PCP: Tammi Sou, MD  REFERRING PROVIDER: [name], MD  REFERRING DIAG:  M54.50 (ICD-10-CM) - Acute midline low back pain, unspecified whether sciatica present  M25.552 (ICD-10-CM) - Pain in left hip   RATIONALE FOR EVALUATION AND TREATMENT: Rehabilitation  THERAPY DIAG:  Muscle weakness (generalized)  Unsteadiness on feet  Other low back pain  ONSET DATE: this year since right before hip surgery  NEXT MD VISIT: around Feb   SUBJECTIVE:  SUBJECTIVE STATEMENT: Pt stated she was pleased to be able to get down on the ground and back up safely in her yard.   PAIN:  Are you having pain? No  PERTINENT HISTORY:  L THA, 10/06/21, peripheral neuropathy, Breast CA 2017, anxiety, depression, DVT R popliteal, DM, TIA 2012, 2 heart caths for broken heart disease  PRECAUTIONS: Fall  WEIGHT BEARING RESTRICTIONS: No  FALLS:  Has patient fallen in last 6 months? Yes. Number of falls 2 .September tripped over a painter's cloth, also trash bin started rolling and she couldn't keep up with it  LIVING ENVIRONMENT: Lives with: lives alone Lives in: House/apartment Stairs: Yes: Internal: 13 steps; walks around house to basement rather than use stairs. 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  LOWER EXTREMITY ROM:    WNL except bil hip IR L 24 deg, R 27 deg  LOWER EXTREMITY MMT:    MMT Right eval Left eval Right 06/16/22 Left 06/16/22  Hip flexion 4 4- 4 4  Hip extension 5 5 4+ 4+  Hip abduction '5 5 5 5  '$ Hip adduction 5 5 4+ 5  Hip internal rotation   5 5  Hip external rotation   5 5  Knee flexion 5 4+ 5 5  Knee extension '5 5 5 5  '$ Ankle dorsiflexion '5 5 5 5  '$ Ankle Plantarflexion   5 5   (Blank rows = not tested)  FUNCTIONAL TESTS:  5 times sit to stand: 19.83 seconds 10 meter walk test: 9.72 sec; Gait speed = 3.37 ft/sec (05/18/22) Berg Balance Scale: 47/56 indicates moderate >50% fall risk; cane outdoors; 06/16/22 scored 54/56 indicating lower fall risk  Functional gait assessment: 19/30; < 19 = high risk fall (05/18/22)  GAIT: WNL   TODAY'S TREATMENT:                                                                                                                              DATE:     06/16/22 THERAPEUTIC EXERCISE: to improve flexibility, strength and mobility.  Verbal and tactile cues throughout for technique.  Rec Bike x6' L2  Standing March RTB around midfoot 2x10 bilat  Side Stepping RTB on ankles 3f (x4 with counter support; x4 without counter support)  MPublic librarian  Walk RTB on ankles  64f (x4 without counter support)   THERAPEUTIC ACTIVITIES: BMerrilee JanskyBalance Test: 54/56   06/13/22 THERAPEUTIC EXERCISE: to improve flexibility, strength and mobility.  Verbal and tactile cues throughout for technique.  Gait for warm up  Step ups 6' 2x10 bil Lateral step ups 6' 2x10 bil Functional squats x 10 with bil UE support STS 2x10 with yellow weight ball  Standing hip abduction, extension, flexion x 10 bil YTB at counter   06/07/22 THERAPEUTIC EXERCISE: to improve flexibility, strength and mobility.  Verbal and tactile cues throughout for technique.  Rec bike - L2 x 6 min STS x 10 no UE support Fwd step ups 2x10 BLE 6' step  Neuro Re-ed: Trunk rotation on airex x 10 bil D2 flexion bil on airex x 10 both directions Toe taps on 9' stool standing on airex x 10 bil Gait dynamic balance with head turns horizontal and turning around to look behind  Clock balance 1/2 circle pattern 5x R/L with ski pole for balance; Focused on posterior and posterior lateral stepping with ski pole x 10 bil   PATIENT EDUCATION:  Education details: progress with PT and ongoing PT POC  Person educated: Patient Education method: Explanation Education comprehension: verbalized understanding  HOME EXERCISE PROGRAM: Access Code: [email or web address] URL: [email or web address] Date: 06/13/2022 Prepared by: BClarene Essex Exercises - Sit to Stand  - 3 x daily - 7 x weekly - 1-2 sets - 5-10 reps - Tandem Stance with Support  - 2 x daily - 7 x weekly - 1 sets - 5 reps - max hold - Standing Shoulder PNF D2 with Trunk Rotation  - 2 x daily - 7 x weekly - 2 sets - 10 reps - Standing with Head Rotation  - 2 x daily - 7 x weekly - 1-2 sets - 5-10 reps - Standing Hip Abduction with Resistance at Ankles and Counter Support  - 1 x daily - 3-4 x weekly - 2 sets - 10 reps - Standing Hip Extension with Resistance at Ankles and Counter Support  - 1 x daily - 3-4 x weekly - 2 sets - 10 reps - Standing Hip Flexion  with Resistance at Ankles and Counter Support  - 1 x daily - 3-4 x weekly - 2 sets - 10 reps  Patient Education - Check for Safety   ASSESSMENT:  CLINICAL IMPRESSION: Pt demonstrated a good participation in today's session with no increased pain.Today's treatment incorporated testing of LE strength which prompted the different exercises that were performed to improve overall strength. Pt responded well to standing strengthening exercises with the band and only had to take one break in between exercises. Pt feels accomplished with the progress she has made in the [place] and wants to continue working on areas where she has some deficit.   OBJECTIVE IMPAIRMENTS: decreased balance, decreased strength, decreased safety awareness, impaired flexibility, impaired sensation, and postural dysfunction.   ACTIVITY LIMITATIONS: stairs  PARTICIPATION LIMITATIONS: yard work  PERSONAL FACTORS: Age, Behavior pattern, Past/current experiences, and 3+ comorbidities: L THA, 10/06/21, peripheral neuropathy, Breast CA 2017, anxiety, depression, DVT R popliteal, DM, TIA 2012,  are also affecting patient's functional outcome.   REHAB POTENTIAL: Excellent  CLINICAL DECISION MAKING: Evolving/moderate complexity  EVALUATION COMPLEXITY: Moderate   GOALS: Goals reviewed with patient? Yes  SHORT TERM GOALS: Target date: 06/07/2022   Patient will be independent with initial HEP. Baseline:  Goal status: MET  05/25/22  2.  DGI and/or FGA to be  assessed by 05/24/22. Baseline:  Goal status: MET  05/18/22  3.  Patient will be educated on strategies to decrease risk of falls.  Baseline:  Goal status: MET  05/18/22  LONG TERM GOALS: Target date: 07/05/2022   Patient will be independent with advanced/ongoing HEP to improve outcomes and carryover.  Baseline:  Goal status: IN PROGRESS  2.  Patient will be able perform sit to stand transfer without UE support to ease commode transfers. Baseline:  Goal  status: MET - 06/07/22  3.  Patient will be able to step up/down curb safely with LRAD for safety with community ambulation.  Baseline:  Goal status: IN PROGRESS   4.  Patient will demonstrate improved functional LE strength as demonstrated by improved 5xSTS to <= 12.6 seconds (norm for age). Baseline: 19.83 sec Goal status: IN PROGRESS  5.  Patient will demonstrate at least 23/30 on FGA to improve gait stability and reduce risk for falls. Baseline: 19/30 (05/18/22) Goal status: IN PROGRESS  6.  Patient will score 56/56 on Berg Balance test to demonstrate lower risk of falls.  Baseline: 47/56 on eval; 54/56 on 06/16/22 Goal status: IN PROGRESS   7.  Patient will be able to perform floor to stand transfer without use of support other than UEs. Baseline:  Goal status: MET 06/16/22  PLAN:  PT FREQUENCY: 2x/week  PT DURATION: 8 weeks  PLANNED INTERVENTIONS: Therapeutic exercises, Therapeutic activity, Neuromuscular re-education, Balance training, Gait training, Patient/Family education, Self Care, Joint mobilization, Spinal mobilization, Cryotherapy, Moist heat, and Manual therapy.  PLAN FOR NEXT SESSION: Continue LE strengthening exercises to increase function. Continue to work on tandem and SLS to improve [employer] score to meet goal.     Maureen Ralphs, Student-PT 06/16/2022, 5:56 PM

## 2022-06-17 DIAGNOSIS — Z1231 Encounter for screening mammogram for malignant neoplasm of breast: Secondary | ICD-10-CM | POA: Diagnosis not present

## 2022-06-17 LAB — HM MAMMOGRAPHY

## 2022-06-20 ENCOUNTER — Ambulatory Visit: Payer: Medicare Other | Admitting: Physical Therapy

## 2022-06-21 ENCOUNTER — Encounter: Payer: Self-pay | Admitting: Hematology and Oncology

## 2022-06-23 ENCOUNTER — Encounter: Payer: Self-pay | Admitting: Physical Therapy

## 2022-06-23 ENCOUNTER — Ambulatory Visit: Payer: Medicare Other | Admitting: Physical Therapy

## 2022-06-23 DIAGNOSIS — M6281 Muscle weakness (generalized): Secondary | ICD-10-CM | POA: Diagnosis not present

## 2022-06-23 DIAGNOSIS — Z9181 History of falling: Secondary | ICD-10-CM | POA: Diagnosis not present

## 2022-06-23 DIAGNOSIS — R42 Dizziness and giddiness: Secondary | ICD-10-CM | POA: Diagnosis not present

## 2022-06-23 DIAGNOSIS — R2681 Unsteadiness on feet: Secondary | ICD-10-CM | POA: Diagnosis not present

## 2022-06-23 DIAGNOSIS — M5459 Other low back pain: Secondary | ICD-10-CM

## 2022-06-23 NOTE — Therapy (Signed)
OUTPATIENT PHYSICAL THERAPY TREATMENT   Patient Name: Marissa Hall MRN: [id number] DOB:01/01/1941, 82 y.o., female Today's Date: 06/23/2022  END OF SESSION:  PT End of Session - 06/23/22 1443     Visit Number 8    Date for PT Re-Evaluation 07/05/22    Authorization Type MCR    Progress Note Due on Visit 10    PT Start Time 1442    PT Stop Time 1530    PT Time Calculation (min) 48 min    Activity Tolerance Patient tolerated treatment well    Behavior During Therapy [facility] for tasks assessed/performed                 Past Medical History:  Diagnosis Date   Acute DVT (deep venous thrombosis) ([place]) 04/23/2019   Right popliteal   Alcoholism ([place])    Anxiety and depression 1964   oncologist started duloxetine 12/2016   Breast cancer ([place]) 03/14/2016   Clinical stage 2A: (triple neg): Right breast, upper inner quadrant, 03/2016.  Neoadjuvant chemo x 5 cycles,lumpectomy 4 mo later, then RT started 10/2016.  Adjuvant Xeloda [id number].  SWOG research trial pt 04/2017--pt randomized to pembrolizumab immunotherapy.  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  A1c Mar 12, 2018 was 7.0% at Dr. Shirlyn Goltz.    Epidermoid cyst of vulva    Chronic epidermoid cyst of the vulva.  Excision done 02/2019   GERD (gastroesophageal reflux disease) 2013   History of basal cell cancer    L ankle   Hyperlipidemia 1986   Hypertension 2008   2022 ->addition of hctz to lisinopril led to 69m drop in GFR. HCTZ d/cd.   Hypothyroidism 1988   Diagnosed in her 453s  Managed by Endocrinologist   Lumbar radiculopathy 04/2019   Dr. STamala Julianto  get plain films of LB and hip (considering MRI due to her hx of cancer)   Nonischemic cardiomyopathy ([place]    Hx of takotsubo CM   Osteoporosis 2015   pt states "osteopenia", but then says that she refused to take the rx med for this condition, so I suspect she had osteoporosis.   Peripheral neuropathy 2017   Patient states diabetic neuropathy in feet prior to starting chemotherapy and then worsened by chemo.    SCC (squamous cell carcinoma), arm, right    Syncope and collapse    05/2021 while in [place]  No prodrome.  Monitor ordered and cardiology referral ordered 05/14/21   TIA (transient ischemic attack) 03/26/2011   2012: question of (HA + R eye "floaters"). CT in ED neg acute. Not admitted, no f/u testing done.  ?ocular migraine?   Past Surgical History:  Procedure Laterality Date   ABDOMINAL HYSTERECTOMY  1972   APPENDECTOMY  1972   BREAST ENHANCEMENT SURGERY  1982   BREAST IMPLANT REMOVAL Right 09/13/2016   Procedure: REMOVAL RIGHT BREAST IMPLANT;  Surgeon: BIrene Limbo MD;  Location: [facility]  Service: Plastics;  Laterality: Right;   BREAST LUMPECTOMY WITH RADIOACTIVE SEED AND SENTINEL LYMPH NODE BIOPSY Right 09/13/2016   Procedure: RIGHT BREAST LUMPECTOMY WITH RADIOACTIVE SEED X 2 AND SENTINEL LYMPH NODE  BIOPSY;  Surgeon: Alphonsa Overall, MD;  Location: [place];  Service: General;  Laterality: Right;   BREAST SURGERY Right 03/14/2016   Biopsy   CAPSULECTOMY Right 09/13/2016   Procedure: RIGHT CAPSULECTOMY;  Surgeon: Irene Limbo, MD;  Location: [place];  Service: Plastics;  Laterality: Right;   CATARACT EXTRACTION, BILATERAL Bilateral 08/10/17 right eye, 08/31/17 left eye   MASS EXCISION Left 02/28/2018   Path: benign.  Procedure: EXCISIONLEFT MEDIAL THIGH MASS ERAS PATHWAY;  Surgeon: Erroll Luna, MD;  Location: [facility];  Service: General;  Laterality: Left;   PORTACATH PLACEMENT N/A 03/15/2016    Procedure: INSERTION PORT-A-CATH WITH [place];  Surgeon: Alphonsa Overall, MD;  Location: [facility];  Service: General;  Laterality: N/A;   PORTACATH REMOVAL  07/2017   SLEEP STUDY  09/2021   NO SLEEP APNEA   surgical repair left ankle Left 2009   s/p [place]   Age 37   TOTAL HIP ARTHROPLASTY Left 10/06/2021   Procedure: TOTAL HIP ARTHROPLASTY ANTERIOR APPROACH;  Surgeon: [name], MD;  Location: [facility];  Service: Orthopedics;  Laterality: Left;   US CAROTID DOPPLER BILATERAL ([place] HX)  01/2021   <50% bilat int carotid sten, otherwise normal.   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   Closed fracture of proximal end of left femur, initial encounter ([place]) 10/06/2021   Hyperkalemia 10/06/2021   Alcohol use 10/06/2021   Malnutrition of moderate degree 10/06/2021   Syncope and collapse 06/10/2021   Closed fracture of multiple ribs of right side 04/30/2021   Acquired hypothyroidism 02/23/2021   Type 2 diabetes mellitus with diabetic polyneuropathy, without long-term current use of insulin ([place]) 02/23/2021   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   Diabetes mellitus type 2 without retinopathy ([place]) 09/21/2020   Meibomian gland dysfunction (MGD) of both eyes 09/21/2020   History of COVID-19 06/23/2020   COVID-19 virus infection 05/27/2020   Piriformis syndrome of right side 05/16/2019   Lower extremity pain, right 04/23/2019   DVT (deep venous thrombosis) ([place]) 04/23/2019   Lumbar radiculopathy, right 04/02/2019   Cataracts, bilateral 07/2017   Other long term (current) drug therapy 06/14/2017   Anxiety and depression    History of breast cancer 03/02/2017   History of therapeutic radiation 03/02/2017   Breast pain, right 10/03/2016   Type 2 diabetes mellitus ([place]) 08/23/2016   Hypertension associated with diabetes ([place]) 08/23/2016   Hypothyroidism 08/23/2016   Chemotherapy-induced neuropathy ([place]) 07/04/2016    Port catheter in place 05/09/2016   Peripheral neuropathy 05/09/2016   Breast cancer of upper-inner quadrant of right female breast ([place]) 03/09/2016   Major depressive disorder, recurrent ([place]) 81/44/8185   Uncomplicated alcohol dependence ([place]) 02/19/2014   Osteoporosis 2015   GERD (gastroesophageal reflux disease) 2013   TIA (transient ischemic attack) 03/26/2011   Epidermoid cyst of vulva 2008   Hyperlipidemia associated with type 2 diabetes mellitus ([place]) 1986    PCP: Tammi Sou, MD  REFERRING PROVIDER: [name], MD  REFERRING DIAG:  M54.50 (ICD-10-CM) - Acute midline low back pain, unspecified whether sciatica present  M25.552 (ICD-10-CM) - Pain in left hip   RATIONALE FOR EVALUATION AND TREATMENT: Rehabilitation  THERAPY DIAG:  Muscle weakness (generalized)  Unsteadiness on feet  Other low back pain  ONSET DATE: this year since right before hip surgery  NEXT MD VISIT: around Feb   SUBJECTIVE:  SUBJECTIVE STATEMENT: Had a hectic morning with her animals at home today. Has had a lot of yard work this weekend due to the weather. No complaints about pain.   PAIN:  Are you having pain? No  PERTINENT HISTORY:  L THA, 10/06/21, peripheral neuropathy, Breast CA 2017, anxiety, depression, DVT R popliteal, DM, TIA 2012, 2 heart caths for broken heart disease  PRECAUTIONS: Fall  WEIGHT BEARING RESTRICTIONS: No  FALLS:  Has patient fallen in last 6 months? Yes. Number of falls 2 .September tripped over a painter's cloth, also trash bin started rolling and she couldn't keep up with it  LIVING ENVIRONMENT: Lives with: lives alone Lives in: House/apartment Stairs: Yes: Internal: 13 steps; walks around house to basement rather than use stairs. 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  LOWER EXTREMITY ROM:    WNL except bil hip IR L 24 deg, R 27 deg  LOWER EXTREMITY MMT:    MMT Right eval Left eval Right 06/16/22 Left 06/16/22  Hip flexion 4 4- 4 4  Hip extension 5 5 4+ 4+  Hip abduction '5 5 5 5  '$ Hip adduction 5 5 4+ 5  Hip internal rotation   5 5  Hip external rotation   5 5  Knee flexion 5 4+ 5 5  Knee extension '5 5 5 5  '$ Ankle dorsiflexion '5 5 5 5  '$ Ankle Plantarflexion   5 5   (Blank rows = not tested)  FUNCTIONAL TESTS:  5 times sit to stand: 19.83 seconds 10 meter walk test: 9.72 sec; Gait speed = 3.37 ft/sec (05/18/22) Berg Balance Scale: 47/56 indicates moderate >50% fall risk; cane outdoors; 06/16/22 scored 54/56 indicating lower fall risk  Functional gait assessment: 19/30; < 19 = high risk fall (05/18/22)  GAIT: WNL   TODAY'S TREATMENT:                                                                                                                              DATE:    06/23/22 THERAPEUTIC EXERCISE: to improve flexibility, strength and mobility.  Verbal and tactile cues throughout for technique. NuStep L5 x6' Step Ups 8" Fwd/Bwd with raising contralateral knee x10 bilat; Lateral step-up 6" & had to use ski pole for support; raising contralateral  knee x10  bilat  Standing Marching w/ GTB around midfoot 2x10 bilat  Monster Walk w/ GTB down and back x3 Seated Knee Flexion/Extension #20 for flexion, #15 for extension; 2x10 bilat   NEUROMUSCULAR RE-EDUCATION: To improve posture, balance, proprioception, coordination, and reduce fall risk Tandem Balance x30": EO firm surface & airex EC firm surface & airex  SLS + Cone taps-3 cones: down and back x5 bilat, once with arm chair support & once without SLS + Cone knockdown and upright- x10 bilat    06/16/22 THERAPEUTIC EXERCISE: to improve flexibility, strength and mobility.  Verbal and tactile cues throughout for technique.  Rec Bike x6' L2  Standing March RTB around midfoot 2x10 bilat  Side Stepping RTB on ankles 19f (x4 with counter support; x4 without counter support)  Monster/Reverse Monster Walk RTB on ankles 120f(x4 without counter support)   THERAPEUTIC ACTIVITIES: BeMerrilee Janskyalance Test: 54/56   06/13/22 THERAPEUTIC EXERCISE: to improve flexibility, strength and mobility.  Verbal and tactile cues throughout for technique.  Gait for warm up  Step ups 6' 2x10 bil Lateral step ups 6' 2x10 bil Functional squats x 10 with bil UE support STS 2x10 with yellow weight ball  Standing hip abduction, extension, flexion x 10 bil YTB at counter    PATIENT EDUCATION:  Education details: progress with PT, HEP progression - GTB to maDean Foods Companyand HEP review  Person educated: Patient Education method: Explanation Education comprehension: verbalized understanding  HOME EXERCISE PROGRAM: Access Code: [email or web address] URL: [email or web address] Date: 06/13/2022 Prepared by: BrClarene EssexExercises - Sit to Stand  - 3 x daily - 7 x weekly - 1-2 sets - 5-10 reps - Tandem Stance with Support  - 2 x daily - 7 x weekly - 1 sets - 5 reps - max hold - Standing Shoulder PNF D2 with Trunk Rotation  - 2 x daily - 7 x weekly - 2 sets - 10 reps - Standing with Head Rotation  - 2 x daily - 7  x weekly - 1-2 sets - 5-10 reps - Standing Hip Abduction with Resistance at Ankles and Counter Support  - 1 x daily - 3-4 x weekly - 2 sets - 10 reps - Standing Hip Extension with Resistance at Ankles and Counter Support  - 1 x daily - 3-4 x weekly - 2 sets - 10 reps - Standing Hip Flexion with Resistance at Ankles and Counter Support  - 1 x daily - 3-4 x weekly - 2 sets - 10 reps  Patient Education - Check for Safety   ASSESSMENT:  CLINICAL IMPRESSION:  Pt reports she has been able to complete her activities well at home in the yard while keeping up with her animals. Pt has been progressing in her balance as she was able to tolerate tandem balance on the Airex with both her eyes open and closed. The pt was introduced to a SLS exercise with cones to incorporate multitasking while working on her balance. Integrating balance while strengthening her LE, the pt performed a progression of step ups with bringing the contralateral knee up. The patient had some trouble performing the lateral step ups as previously described, which shows there is room for improvement. Overall, the patient is progressing well and continues to be motivated in her POC.   OBJECTIVE IMPAIRMENTS: decreased balance, decreased strength, decreased safety awareness, impaired flexibility, impaired sensation, and postural dysfunction.   ACTIVITY LIMITATIONS: stairs  PARTICIPATION LIMITATIONS: yard work  PERSONAL FACTORS: Age, Behavior pattern, Past/current  experiences, and 3+ comorbidities: L THA, 10/06/21, peripheral neuropathy, Breast CA 2017, anxiety, depression, DVT R popliteal, DM, TIA 2012,  are also affecting patient's functional outcome.   REHAB POTENTIAL: Excellent  CLINICAL DECISION MAKING: Evolving/moderate complexity  EVALUATION COMPLEXITY: Moderate   GOALS: Goals reviewed with patient? Yes  SHORT TERM GOALS: Target date: 06/07/2022   Patient will be independent with initial HEP. Baseline:  Goal status: MET   05/25/22  2.  DGI and/or FGA to be assessed by 05/24/22. Baseline:  Goal status: MET  05/18/22  3.  Patient will be educated on strategies to decrease risk of falls.  Baseline:  Goal status: MET  05/18/22  LONG TERM GOALS: Target date: 07/05/2022   Patient will be independent with advanced/ongoing HEP to improve outcomes and carryover.  Baseline:  Goal status: IN PROGRESS  2.  Patient will be able perform sit to stand transfer without UE support to ease commode transfers. Baseline:  Goal status: MET - 06/07/22  3.  Patient will be able to step up/down curb safely with LRAD for safety with community ambulation.  Baseline:  Goal status: IN PROGRESS   4.  Patient will demonstrate improved functional LE strength as demonstrated by improved 5xSTS to <= 12.6 seconds (norm for age). Baseline: 19.83 sec Goal status: IN PROGRESS  5.  Patient will demonstrate at least 23/30 on FGA to improve gait stability and reduce risk for falls. Baseline: 19/30 (05/18/22) Goal status: IN PROGRESS  6.  Patient will score 56/56 on Berg Balance test to demonstrate lower risk of falls.  Baseline: 47/56 on eval; 54/56 on 06/16/22 Goal status: IN PROGRESS   7.  Patient will be able to perform floor to stand transfer without use of support other than UEs. Baseline:  Goal status: MET 06/16/22  PLAN:  PT FREQUENCY: 2x/week  PT DURATION: 8 weeks  PLANNED INTERVENTIONS: Therapeutic exercises, Therapeutic activity, Neuromuscular re-education, Balance training, Gait training, Patient/Family education, Self Care, Joint mobilization, Spinal mobilization, Cryotherapy, Moist heat, and Manual therapy.  PLAN FOR NEXT SESSION: Work on ankle stability to increase stability, Continue to work on [employer], continue balance work. Update HEP as needed.    Lynze Reddy Romero-Perozo, Student-PT 06/23/2022, 3:54 PM

## 2022-06-26 ENCOUNTER — Other Ambulatory Visit: Payer: Self-pay | Admitting: Family Medicine

## 2022-06-27 ENCOUNTER — Ambulatory Visit: Payer: Medicare Other | Admitting: Physical Therapy

## 2022-06-28 NOTE — Therapy (Incomplete)
OUTPATIENT PHYSICAL THERAPY TREATMENT   Patient Name: Marissa Hall MRN: [id number] DOB:[birth year], 81 y.o., female Today's Date: 06/23/2022  END OF SESSION:  PT End of Session - 06/23/22 1443     Visit Number 8    Date for PT Re-Evaluation 07/05/22    Authorization Type MCR    Progress Note Due on Visit 10    PT Start Time 1442    PT Stop Time 1530    PT Time Calculation (min) 48 min    Activity Tolerance Patient tolerated treatment well    Behavior During Therapy [facility] for tasks assessed/performed                 Past Medical History:  Diagnosis Date   Acute DVT (deep venous thrombosis) ([place]) 04/23/2019   Right popliteal   Alcoholism ([place])    Anxiety and depression 1964   oncologist started duloxetine 12/2016   Breast cancer ([place]) 03/14/2016   Clinical stage 2A: (triple neg): Right breast, upper inner quadrant, 03/2016.  Neoadjuvant chemo x 5 cycles,lumpectomy 4 mo later, then RT started 10/2016.  Adjuvant Xeloda [id number].  SWOG research trial pt 04/2017--pt randomized to pembrolizumab immunotherapy.  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  A1c Mar 12, 2018 was 7.0% at Dr. Shirlyn Goltz.    Epidermoid cyst of vulva    Chronic epidermoid cyst of the vulva.  Excision done 02/2019   GERD (gastroesophageal reflux disease) 2013   History of basal cell cancer    L ankle   Hyperlipidemia 1986   Hypertension 2008   2022 ->addition of hctz to lisinopril led to 73m drop in GFR. HCTZ d/cd.   Hypothyroidism 1988   Diagnosed in her 431s  Managed by Endocrinologist   Lumbar radiculopathy 04/2019   Dr. STamala Julianto  get plain films of LB and hip (considering MRI due to her hx of cancer)   Nonischemic cardiomyopathy ([place]    Hx of takotsubo CM   Osteoporosis 2015   pt states "osteopenia", but then says that she refused to take the rx med for this condition, so I suspect she had osteoporosis.   Peripheral neuropathy 2017   Patient states diabetic neuropathy in feet prior to starting chemotherapy and then worsened by chemo.    SCC (squamous cell carcinoma), arm, right    Syncope and collapse    05[place]2021 [place]  No prodrome.  Monitor ordered and cardiology referral ordered 05/14/21   TIA (transient ischemic attack) 03/26/2011   2012: question of (HA + R eye "floaters"). CT in ED neg acute. Not admitted, no f/u testing done.  ?ocular migraine?   Past Surgical History:  Procedure Laterality Date   ABDOMINAL HYSTERECTOMY  1972   APPENDECTOMY  1972   BREAST ENHANCEMENT SURGERY  1982   BREAST IMPLANT REMOVAL Right 09/13/2016   Procedure: REMOVAL RIGHT BREAST IMPLANT;  Surgeon: BIrene Limbo MD;  Location: [facility]  Service: Plastics;  Laterality: Right;   BREAST LUMPECTOMY WITH RADIOACTIVE SEED AND SENTINEL LYMPH NODE BIOPSY Right 09/13/2016   Procedure: RIGHT BREAST LUMPECTOMY WITH RADIOACTIVE SEED X 2 AND SENTINEL LYMPH NODE  BIOPSY;  Surgeon: Alphonsa Overall, MD;  Location: [place];  Service: General;  Laterality: Right;   BREAST SURGERY Right 03/14/2016   Biopsy   CAPSULECTOMY Right 09/13/2016   Procedure: RIGHT CAPSULECTOMY;  Surgeon: Irene Limbo, MD;  Location: [place];  Service: Plastics;  Laterality: Right;   CATARACT EXTRACTION, BILATERAL Bilateral 08/10/17 right eye, 08/31/17 left eye   MASS EXCISION Left 02/28/2018   Path: benign.  Procedure: EXCISIONLEFT MEDIAL THIGH MASS ERAS PATHWAY;  Surgeon: Erroll Luna, MD;  Location: [place] [facility];  Service: General;  Laterality: Left;   PORTACATH PLACEMENT N/A 03/15/2016    Procedure: INSERTION PORT-A-CATH WITH [place];  Surgeon: Alphonsa Overall, MD;  Location: [facility];  Service: General;  Laterality: N/A;   PORTACATH REMOVAL  07/2017   SLEEP STUDY  09/2021   NO SLEEP APNEA   surgical repair left ankle Left 2009   s/p [place]   Age 89   TOTAL HIP ARTHROPLASTY Left 10/06/2021   Procedure: TOTAL HIP ARTHROPLASTY ANTERIOR APPROACH;  Surgeon: [name], MD;  Location: [facility];  Service: Orthopedics;  Laterality: Left;   US CAROTID DOPPLER BILATERAL ([place] [facility])  01/2021   <50% bilat int carotid sten, otherwise normal.   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   Closed fracture of proximal end of left femur, initial encounter ([place]) 10/06/2021   Hyperkalemia 10/06/2021   Alcohol use 10/06/2021   Malnutrition of moderate degree 10/06/2021   Syncope and collapse 06/10/2021   Closed fracture of multiple ribs of right side 04/30/2021   Acquired hypothyroidism 02/23/2021   Type 2 diabetes mellitus with diabetic polyneuropathy, without long-term current use of insulin ([place]) 02/23/2021   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   Diabetes mellitus type 2 without retinopathy ([place]) 09/21/2020   Meibomian gland dysfunction (MGD) of both eyes 09/21/2020   History of COVID-19 06/23/2020   COVID-19 virus infection 05/27/2020   Piriformis syndrome of right side 05/16/2019   Lower extremity pain, right 04/23/2019   DVT (deep venous thrombosis) ([place]) 04/23/2019   Lumbar radiculopathy, right 04/02/2019   Cataracts, bilateral 07/2017   Other long term (current) drug therapy 06/14/2017   Anxiety and depression    History of breast cancer 03/02/2017   History of therapeutic radiation 03/02/2017   Breast pain, right 10/03/2016   Type 2 diabetes mellitus ([place]) 08/23/2016   Hypertension associated with diabetes ([place]) 08/23/2016   Hypothyroidism 08/23/2016   Chemotherapy-induced neuropathy ([place]) 07/04/2016    Port catheter in place 05/09/2016   Peripheral neuropathy 05/09/2016   Breast cancer of upper-inner quadrant of right female breast ([place]) 03/09/2016   Major depressive disorder, recurrent ([place]) 81/44/8185   Uncomplicated alcohol dependence ([place]) 02/19/2014   Osteoporosis 2015   GERD (gastroesophageal reflux disease) 2013   TIA (transient ischemic attack) 03/26/2011   Epidermoid cyst of vulva 2008   Hyperlipidemia associated with type 2 diabetes mellitus ([place]) 1986    PCP: Tammi Sou, MD  REFERRING PROVIDER: [name], MD  REFERRING DIAG:  M54.50 (ICD-10-CM) - Acute midline low back pain, unspecified whether sciatica present  M25.552 (ICD-10-CM) - Pain in left hip   RATIONALE FOR EVALUATION AND TREATMENT: Rehabilitation  THERAPY DIAG:  Muscle weakness (generalized)  Unsteadiness on feet  Other low back pain  ONSET DATE: this year since right before hip surgery  NEXT MD VISIT: around Feb   SUBJECTIVE:  SUBJECTIVE STATEMENT: ***  PAIN:  Are you having pain? No  PERTINENT HISTORY:  L THA, 10/06/21, peripheral neuropathy, Breast CA 2017, anxiety, depression, DVT R popliteal, DM, TIA 2012, 2 heart caths for broken heart disease  PRECAUTIONS: Fall  WEIGHT BEARING RESTRICTIONS: No  FALLS:  Has patient fallen in last 6 months? Yes. Number of falls 2 .September tripped over a painter's cloth, also trash bin started rolling and she couldn't keep up with it  LIVING ENVIRONMENT: Lives with: lives alone Lives in: House/apartment Stairs: Yes: Internal: 13 steps; walks around house to basement rather than use stairs. 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  LOWER EXTREMITY ROM:    WNL except bil hip IR L 24 deg, R 27 deg  LOWER EXTREMITY MMT:    MMT Right eval Left eval Right 06/16/22 Left 06/16/22  Hip flexion 4 4- 4 4  Hip extension 5 5 4+ 4+  Hip abduction '5 5 5 5  '$ Hip adduction 5 5 4+ 5  Hip internal rotation   5 5  Hip external rotation   5 5  Knee flexion 5 4+ 5 5  Knee extension '5 5 5 5  '$ Ankle dorsiflexion '5 5 5 5  '$ Ankle Plantarflexion   5 5   (Blank rows = not tested)  FUNCTIONAL TESTS:  5 times sit to stand: 19.83 seconds 10 meter walk test: 9.72 sec; Gait speed = 3.37 ft/sec (05/18/22) Berg Balance Scale: 47/56 indicates moderate >50% fall risk; cane outdoors; 06/16/22 scored 54/56 indicating lower fall risk  Functional gait assessment: 19/30; < 19 = high risk fall (05/18/22)  GAIT: WNL   TODAY'S TREATMENT:                                                                                                                              DATE:    06/30/22 THERAPEUTIC EXERCISE: to improve flexibility, strength and mobility.  Verbal and tactile cues throughout for technique. NuStep L5 x6' Step Ups 8" Fwd/Bwd with raising contralateral knee x10 bilat; Lateral step-up 6" & had to use ski pole for support; raising contralateral knee x10 bilat  Standing Marching w/ GTB around midfoot 2x10 bilat  Monster Walk w/ GTB down and back x3 Seated Knee Flexion/Extension #20  for  flexion, #15 for extension; 2x10 bilat   NEUROMUSCULAR RE-EDUCATION: To improve posture, balance, proprioception, coordination, and reduce fall risk Tandem Balance x30": EO firm surface & airex EC firm surface & airex  SLS + Cone taps-3 cones: down and back x5 bilat, once with arm chair support & once without SLS + Cone knockdown and upright- x10 bilat    06/23/22 THERAPEUTIC EXERCISE: to improve flexibility, strength and mobility.  Verbal and tactile cues throughout for technique. NuStep L5 x6' Step Ups 8" Fwd/Bwd with raising contralateral knee x10 bilat; Lateral step-up 6" & had to use ski pole for support; raising contralateral knee x10 bilat  Standing Marching w/ GTB around midfoot 2x10 bilat  Monster Walk w/ GTB down and back x3 Seated Knee Flexion/Extension #20 for flexion, #15 for extension; 2x10 bilat   NEUROMUSCULAR RE-EDUCATION: To improve posture, balance, proprioception, coordination, and reduce fall risk Tandem Balance x30": EO firm surface & airex EC firm surface & airex  SLS + Cone taps-3 cones: down and back x5 bilat, once with arm chair support & once without SLS + Cone knockdown and upright- x10 bilat    06/16/22 THERAPEUTIC EXERCISE: to improve flexibility, strength and mobility.  Verbal and tactile cues throughout for technique.  Rec Bike x6' L2  Standing March RTB around midfoot 2x10 bilat  Side Stepping RTB on ankles 87f (x4 with counter support; x4 without counter support)  Monster/Reverse Monster Walk RTB on ankles 165f(x4 without counter support)   THERAPEUTIC ACTIVITIES: Berg Balance Test: 54/56   PATIENT EDUCATION:  Education details: progress with PT, HEP progression - GTB to maDean Foods Companyand HEP review  Person educated: Patient Education method: Explanation Education comprehension: verbalized understanding  HOME EXERCISE PROGRAM: Access Code: [email or web address] URL: [email or web address] Date: 06/13/2022 Prepared by:  BrClarene EssexExercises - Sit to Stand  - 3 x daily - 7 x weekly - 1-2 sets - 5-10 reps - Tandem Stance with Support  - 2 x daily - 7 x weekly - 1 sets - 5 reps - max hold - Standing Shoulder PNF D2 with Trunk Rotation  - 2 x daily - 7 x weekly - 2 sets - 10 reps - Standing with Head Rotation  - 2 x daily - 7 x weekly - 1-2 sets - 5-10 reps - Standing Hip Abduction with Resistance at Ankles and Counter Support  - 1 x daily - 3-4 x weekly - 2 sets - 10 reps - Standing Hip Extension with Resistance at Ankles and Counter Support  - 1 x daily - 3-4 x weekly - 2 sets - 10 reps - Standing Hip Flexion with Resistance at Ankles and Counter Support  - 1 x daily - 3-4 x weekly - 2 sets - 10 reps  Patient Education - Check for Safety   ASSESSMENT:  CLINICAL IMPRESSION:  ***  OBJECTIVE IMPAIRMENTS: decreased balance, decreased strength, decreased safety awareness, impaired flexibility, impaired sensation, and postural dysfunction.   ACTIVITY LIMITATIONS: stairs  PARTICIPATION LIMITATIONS: yard work  PERSONAL FACTORS: Age, Behavior pattern, Past/current experiences, and 3+ comorbidities: L THA, 10/06/21, peripheral neuropathy, Breast CA 2017, anxiety, depression, DVT R popliteal, DM, TIA 2012,  are also affecting patient's functional outcome.   REHAB POTENTIAL: Excellent  CLINICAL DECISION MAKING: Evolving/moderate complexity  EVALUATION COMPLEXITY: Moderate   GOALS: Goals reviewed with patient? Yes  SHORT TERM GOALS: Target date: 06/07/2022   Patient will be independent with initial HEP. Baseline:  Goal status: MET  05/25/22  2.  DGI  and/or FGA to be assessed by 05/24/22. Baseline:  Goal status: MET  05/18/22  3.  Patient will be educated on strategies to decrease risk of falls.  Baseline:  Goal status: MET  05/18/22  LONG TERM GOALS: Target date: 07/05/2022   Patient will be independent with advanced/ongoing HEP to improve outcomes and carryover.  Baseline:  Goal status: IN  PROGRESS  2.  Patient will be able perform sit to stand transfer without UE support to ease commode transfers. Baseline:  Goal status: MET - 06/07/22  3.  Patient will be able to step up/down curb safely with LRAD for safety with community ambulation.  Baseline:  Goal status: IN PROGRESS   4.  Patient will demonstrate improved functional LE strength as demonstrated by improved 5xSTS to <= 12.6 seconds (norm for age). Baseline: 19.83 sec Goal status: IN PROGRESS  5.  Patient will demonstrate at least 23/30 on FGA to improve gait stability and reduce risk for falls. Baseline: 19/30 (05/18/22) Goal status: IN PROGRESS  6.  Patient will score 56/56 on Berg Balance test to demonstrate lower risk of falls.  Baseline: 47/56 on eval; 54/56 on 06/16/22 Goal status: IN PROGRESS   7.  Patient will be able to perform floor to stand transfer without use of support other than UEs. Baseline:  Goal status: MET 06/16/22  PLAN:  PT FREQUENCY: 2x/week  PT DURATION: 8 weeks  PLANNED INTERVENTIONS: Therapeutic exercises, Therapeutic activity, Neuromuscular re-education, Balance training, Gait training, Patient/Family education, Self Care, Joint mobilization, Spinal mobilization, Cryotherapy, Moist heat, and Manual therapy.  PLAN FOR NEXT SESSION: Work on ankle stability to increase stability, Continue to work on [employer], continue balance work. Update HEP as needed.    Veronica Romero-Perozo, Student-PT 06/23/2022, 3:54 PM

## 2022-06-30 ENCOUNTER — Ambulatory Visit: Payer: Medicare Other | Admitting: Physical Therapy

## 2022-07-04 NOTE — Therapy (Incomplete)
OUTPATIENT PHYSICAL THERAPY TREATMENT   Patient Name: Marissa Hall MRN: [id number] DOB:Nov 28, 1940, 82 y.o., female Today's Date: 07/04/2022  END OF SESSION:        Past Medical History:  Diagnosis Date   Acute DVT (deep venous thrombosis) ([place]) 04/23/2019   Right popliteal   Alcoholism ([place])    Anxiety and depression 1964   oncologist started duloxetine 12/2016   Breast cancer ([place]) 03/14/2016   Clinical stage 2A: (triple neg): Right breast, upper inner quadrant, 03/2016.  Neoadjuvant chemo x 5 cycles,lumpectomy 4 mo later, then RT started 10/2016.  Adjuvant Xeloda [id number].  SWOG research trial pt 04/2017--pt randomized to pembrolizumab immunotherapy.  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  A1c Mar 12, 2018 was 7.0% at Dr. Shirlyn Goltz.    Epidermoid cyst of vulva    Chronic epidermoid cyst of the vulva.  Excision done 02/2019   GERD (gastroesophageal reflux disease) 2013   History of basal cell cancer    L ankle   Hyperlipidemia 1986   Hypertension 2008   2022 ->addition of hctz to lisinopril led to 47m drop in GFR. HCTZ d/cd.   Hypothyroidism 1988   Diagnosed in her 463s  Managed by Endocrinologist   Lumbar radiculopathy 04/2019   Dr. STamala Julianto get plain films of LB and hip (considering MRI due to her hx of cancer)   Nonischemic cardiomyopathy ([place]    Hx of takotsubo CM   Osteoporosis 2015   pt states "osteopenia", but then says that she refused to take the rx med for this condition, so I suspect she had osteoporosis.   Peripheral neuropathy 2017   Patient states diabetic neuropathy in feet prior to  starting chemotherapy and then worsened by chemo.    SCC (squamous cell carcinoma), arm, right    Syncope and collapse    05/2021 [place]  No prodrome.  Monitor ordered and cardiology referral ordered 05/14/21   TIA (transient ischemic attack) 03/26/2011   2012: question of (HA + R eye "floaters"). CT in ED neg acute. Not admitted, no f/u testing done.  ?ocular migraine?   Past Surgical History:  Procedure Laterality Date   ABDOMINAL HYSTERECTOMY  1972   APPENDECTOMY  1972   BREAST ENHANCEMENT SURGERY  1982   BREAST IMPLANT REMOVAL Right 09/13/2016   Procedure: REMOVAL RIGHT BREAST IMPLANT;  Surgeon: BIrene Limbo MD;  Location: [facility]  Service: Plastics;  Laterality: Right;   BREAST LUMPECTOMY WITH RADIOACTIVE SEED AND SENTINEL LYMPH NODE BIOPSY Right 09/13/2016   Procedure: RIGHT BREAST LUMPECTOMY WITH RADIOACTIVE SEED X 2 AND SENTINEL LYMPH NODE BIOPSY;  Surgeon: DAlphonsa Overall MD;  Location: [facility]  Service: General;  Laterality: Right;   BREAST SURGERY Right 03/14/2016   Biopsy   CAPSULECTOMY Right 09/13/2016   Procedure: RIGHT CAPSULECTOMY;  Surgeon: BIrene Limbo MD;  [facility]  Service: Plastics;  Laterality: Right;   CATARACT EXTRACTION, BILATERAL Bilateral 08/10/17 right eye, 08/31/17 left eye   MASS EXCISION Left 02/28/2018   Path: benign.  Procedure: EXCISIONLEFT MEDIAL THIGH MASS ERAS PATHWAY;  Surgeon: CErroll Luna MD;  Location: [facility];  Service: General;  Laterality: Left;   PORTACATH PLACEMENT N/A 03/15/2016   Procedure: INSERTION PORT-A-CATH WITH [place];  Surgeon: Alphonsa Overall, MD;  Location: [facility];  Service: General;  Laterality: N/A;   PORTACATH REMOVAL  07/2017   SLEEP STUDY  09/2021   NO SLEEP APNEA   surgical repair left ankle Left 2009   s/p [place]   Age 17   TOTAL HIP ARTHROPLASTY Left 10/06/2021   Procedure: TOTAL HIP  ARTHROPLASTY ANTERIOR APPROACH;  Surgeon: [name], MD;  Location: [facility];  Service: Orthopedics;  Laterality: Left;   US CAROTID DOPPLER BILATERAL ([place] [facility])  01/2021   <50% bilat int carotid sten, otherwise normal.   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   Closed fracture of proximal end of left femur, initial encounter ([place]) 10/06/2021   Hyperkalemia 10/06/2021   Alcohol use 10/06/2021   Malnutrition of moderate degree 10/06/2021   Syncope and collapse 06/10/2021   Closed fracture of multiple ribs of right side 04/30/2021   Acquired hypothyroidism 02/23/2021   Type 2 diabetes mellitus with diabetic polyneuropathy, without long-term current use of insulin ([place]) 02/23/2021   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   Diabetes mellitus type 2 without retinopathy ([place]) 09/21/2020   Meibomian gland dysfunction (MGD) of both eyes 09/21/2020   History of COVID-19 06/23/2020   COVID-19 virus infection 05/27/2020   Piriformis syndrome of right side 05/16/2019   Lower extremity pain, right 04/23/2019   DVT (deep venous thrombosis) ([place]) 04/23/2019   Lumbar radiculopathy, right 04/02/2019   Cataracts, bilateral 07/2017   Other long term (current) drug therapy 06/14/2017   Anxiety and depression    History of breast cancer 03/02/2017   History of therapeutic radiation 03/02/2017   Breast pain, right 10/03/2016   Type 2 diabetes mellitus ([place]) 08/23/2016   Hypertension associated with diabetes ([place]) 08/23/2016   Hypothyroidism 08/23/2016   Chemotherapy-induced neuropathy ([place]) 07/04/2016   Port catheter in place 05/09/2016   Peripheral neuropathy 05/09/2016   Breast cancer of upper-inner quadrant of right female breast ([place]) 03/09/2016   Major depressive disorder, recurrent ([place]) 09/60/4540   Uncomplicated alcohol dependence ([place]) 02/19/2014   Osteoporosis 2015   GERD (gastroesophageal reflux disease) 2013   TIA (transient ischemic  attack) 03/26/2011   Epidermoid cyst of vulva 2008   Hyperlipidemia associated with type 2 diabetes mellitus ([place]) 1986    PCP: Tammi Sou, MD  REFERRING PROVIDER: [name], MD  REFERRING DIAG:  M54.50 (ICD-10-CM) - Acute midline low back pain, unspecified whether sciatica present  M25.552 (ICD-10-CM) - Pain in left hip   RATIONALE FOR EVALUATION AND TREATMENT: Rehabilitation  THERAPY DIAG:  No diagnosis found.  ONSET DATE: this year since right before hip surgery  NEXT MD VISIT: around Feb   SUBJECTIVE:  SUBJECTIVE STATEMENT: ***  PAIN:  Are you having pain? No  PERTINENT HISTORY:  L THA, 10/06/21, peripheral neuropathy, Breast CA 2017, anxiety, depression, DVT R popliteal, DM, TIA 2012, 2 heart caths for broken heart disease  PRECAUTIONS: Fall  WEIGHT BEARING RESTRICTIONS: No  FALLS:  Has patient fallen in last 6 months? Yes. Number of falls 2 .September tripped over a painter's cloth, also trash bin started rolling and she couldn't keep up with it  LIVING ENVIRONMENT: Lives with: lives alone Lives in: House/apartment Stairs: Yes: Internal: 13 steps; walks around house to basement rather than use stairs. 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  LOWER EXTREMITY ROM:    WNL except bil hip IR L 24 deg, R 27 deg  LOWER EXTREMITY MMT:    MMT Right eval Left eval Right 06/16/22 Left 06/16/22  Hip flexion 4 4- 4 4  Hip extension 5 5 4+ 4+  Hip abduction '5 5 5 5  '$ Hip adduction 5 5 4+ 5  Hip internal rotation   5 5  Hip external rotation   5 5  Knee flexion 5 4+ 5 5  Knee extension '5 5 5 5  '$ Ankle dorsiflexion '5 5 5 5  '$ Ankle Plantarflexion   5 5   (Blank rows = not tested)  FUNCTIONAL TESTS:  5 times sit to stand: 19.83 seconds 10 meter walk test: 9.72 sec; Gait speed = 3.37 ft/sec (05/18/22) Berg Balance Scale: 47/56 indicates moderate >50% fall risk; cane outdoors; 06/16/22 scored 54/56 indicating lower fall risk  Functional gait assessment: 19/30; < 19 = high risk fall (05/18/22)  GAIT: WNL   TODAY'S TREATMENT:                                                                                                                              DATE:    07/05/22 THERAPEUTIC EXERCISE: to improve flexibility, strength and mobility.  Verbal and tactile cues throughout for technique. NuStep L5 x6' Step Ups 8" Fwd/Bwd with raising contralateral knee x10 bilat; Lateral step-up 6" & had to use ski pole for support; raising contralateral knee x10 bilat  Standing Marching w/ GTB around midfoot 2x10 bilat  Monster Walk w/ GTB down and back x3 Seated Knee Flexion/Extension #20 for flexion, #  15 for extension; 2x10 bilat   NEUROMUSCULAR RE-EDUCATION: To improve posture, balance, proprioception, coordination, and reduce fall risk Tandem Balance x30": EO firm surface & airex EC firm surface & airex  SLS + Cone taps-3 cones: down and back x5 bilat, once with arm chair support & once without SLS + Cone knockdown and upright- x10 bilat    06/23/22 THERAPEUTIC EXERCISE: to improve  flexibility, strength and mobility.  Verbal and tactile cues throughout for technique. NuStep L5 x6' Step Ups 8" Fwd/Bwd with raising contralateral knee x10 bilat; Lateral step-up 6" & had to use ski pole for support; raising contralateral knee x10 bilat  Standing Marching w/ GTB around midfoot 2x10 bilat  Monster Walk w/ GTB down and back x3 Seated Knee Flexion/Extension #20 for flexion, #15 for extension; 2x10 bilat   NEUROMUSCULAR RE-EDUCATION: To improve posture, balance, proprioception, coordination, and reduce fall risk Tandem Balance x30": EO firm surface & airex EC firm surface & airex  SLS + Cone taps-3 cones: down and back x5 bilat, once with arm chair support & once without SLS + Cone knockdown and upright- x10 bilat    06/16/22 THERAPEUTIC EXERCISE: to improve flexibility, strength and mobility.  Verbal and tactile cues throughout for technique.  Rec Bike x6' L2  Standing March RTB around midfoot 2x10 bilat  Side Stepping RTB on ankles 43f (x4 with counter support; x4 without counter support)  Monster/Reverse Monster Walk RTB on ankles 141f(x4 without counter support)   THERAPEUTIC ACTIVITIES: Berg Balance Test: 54/56   PATIENT EDUCATION:  Education details: progress with PT, HEP progression - GTB to maDean Foods Companyand HEP review  Person educated: Patient Education method: Explanation Education comprehension: verbalized understanding  HOME EXERCISE PROGRAM: Access Code: [email or web address] URL: [email or web address] Date: 06/13/2022 Prepared by: BrClarene EssexExercises - Sit to Stand  - 3 x daily - 7 x weekly - 1-2 sets - 5-10 reps - Tandem Stance with Support  - 2 x daily - 7 x weekly - 1 sets - 5 reps - max hold - Standing Shoulder PNF D2 with Trunk Rotation  - 2 x daily - 7 x weekly - 2 sets - 10 reps - Standing with Head Rotation  - 2 x daily - 7 x weekly - 1-2 sets - 5-10 reps - Standing Hip Abduction with Resistance at Ankles and Counter  Support  - 1 x daily - 3-4 x weekly - 2 sets - 10 reps - Standing Hip Extension with Resistance at Ankles and Counter Support  - 1 x daily - 3-4 x weekly - 2 sets - 10 reps - Standing Hip Flexion with Resistance at Ankles and Counter Support  - 1 x daily - 3-4 x weekly - 2 sets - 10 reps  Patient Education - Check for Safety   ASSESSMENT:  CLINICAL IMPRESSION:  ***  OBJECTIVE IMPAIRMENTS: decreased balance, decreased strength, decreased safety awareness, impaired flexibility, impaired sensation, and postural dysfunction.   ACTIVITY LIMITATIONS: stairs  PARTICIPATION LIMITATIONS: yard work  PERSONAL FACTORS: Age, Behavior pattern, Past/current experiences, and 3+ comorbidities: L THA, 10/06/21, peripheral neuropathy, Breast CA 2017, anxiety, depression, DVT R popliteal, DM, TIA 2012,  are also affecting patient's functional outcome.   REHAB POTENTIAL: Excellent  CLINICAL DECISION MAKING: Evolving/moderate complexity  EVALUATION COMPLEXITY: Moderate   GOALS: Goals reviewed with patient? Yes  SHORT TERM GOALS: Target date: 06/07/2022   Patient will be independent with initial HEP. Baseline:  Goal status: MET  05/25/22  2.  DGI  and/or FGA to be assessed by 05/24/22. Baseline:  Goal status: MET  05/18/22  3.  Patient will be educated on strategies to decrease risk of falls.  Baseline:  Goal status: MET  05/18/22  LONG TERM GOALS: Target date: 07/05/2022   Patient will be independent with advanced/ongoing HEP to improve outcomes and carryover.  Baseline:  Goal status: IN PROGRESS  2.  Patient will be able perform sit to stand transfer without UE support to ease commode transfers. Baseline:  Goal status: MET - 06/07/22  3.  Patient will be able to step up/down curb safely with LRAD for safety with community ambulation.  Baseline:  Goal status: IN PROGRESS   4.  Patient will demonstrate improved functional LE strength as demonstrated by improved 5xSTS to <= 12.6 seconds  (norm for age). Baseline: 19.83 sec Goal status: IN PROGRESS  5.  Patient will demonstrate at least 23/30 on FGA to improve gait stability and reduce risk for falls. Baseline: 19/30 (05/18/22) Goal status: IN PROGRESS  6.  Patient will score 56/56 on Berg Balance test to demonstrate lower risk of falls.  Baseline: 47/56 on eval; 54/56 on 06/16/22 Goal status: IN PROGRESS   7.  Patient will be able to perform floor to stand transfer without use of support other than UEs. Baseline:  Goal status: MET 06/16/22  PLAN:  PT FREQUENCY: 2x/week  PT DURATION: 8 weeks  PLANNED INTERVENTIONS: Therapeutic exercises, Therapeutic activity, Neuromuscular re-education, Balance training, Gait training, Patient/Family education, Self Care, Joint mobilization, Spinal mobilization, Cryotherapy, Moist heat, and Manual therapy.  PLAN FOR NEXT SESSION: Work on ankle stability to increase stability, Continue to work on [employer], continue balance work. Update HEP as needed.    Lalonnie Shaffer, PT 07/04/2022, 3:12 PM

## 2022-07-05 ENCOUNTER — Ambulatory Visit: Payer: Medicare Other

## 2022-07-05 DIAGNOSIS — M6281 Muscle weakness (generalized): Secondary | ICD-10-CM | POA: Diagnosis not present

## 2022-07-05 DIAGNOSIS — R2681 Unsteadiness on feet: Secondary | ICD-10-CM

## 2022-07-05 DIAGNOSIS — M5459 Other low back pain: Secondary | ICD-10-CM

## 2022-07-05 DIAGNOSIS — Z9181 History of falling: Secondary | ICD-10-CM | POA: Diagnosis not present

## 2022-07-05 DIAGNOSIS — R42 Dizziness and giddiness: Secondary | ICD-10-CM | POA: Diagnosis not present

## 2022-07-05 NOTE — Therapy (Addendum)
OUTPATIENT PHYSICAL THERAPY TREATMENT / DISCHARGE SUMMARY    Patient Name: Marissa Hall MRN: [id number] DOB:08-Aug-1940, 82 y.o., female Today's Date: 07/05/2022  END OF SESSION:  PT End of Session - 07/05/22 1157     Visit Number 9    Date for PT Re-Evaluation 07/05/22    Authorization Type MCR    Progress Note Due on Visit 10    PT Start Time 1102    PT Stop Time 1140    PT Time Calculation (min) 38 min    Activity Tolerance Patient tolerated treatment well    Behavior During Therapy [facility] for tasks assessed/performed                 Past Medical History:  Diagnosis Date   Acute DVT (deep venous thrombosis) (HCC) 04/23/2019   Right popliteal   Alcoholism (HCC)    Anxiety and depression 1964   oncologist started duloxetine 12/2016   Breast cancer (HCC) 03/14/2016   Clinical stage 2A: (triple neg): Right breast, upper inner quadrant, 03/2016.  Neoadjuvant chemo x 5 cycles,lumpectomy 4 mo later, then RT started 10/2016.  Adjuvant Xeloda [id number].  SWOG research trial pt 04/2017--pt randomized to pembrolizumab immunotherapy.  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  A1c Mar 12, 2018 was 7.0% at Dr. Lacie Draft.    Epidermoid cyst of vulva    Chronic epidermoid cyst of the vulva.  Excision done 02/2019   GERD (gastroesophageal reflux disease) 2013   History of basal cell cancer    L ankle   Hyperlipidemia 1986   Hypertension 2008   2022 ->addition of hctz to lisinopril led to 25ml drop in GFR. HCTZ d/cd.   Hypothyroidism 1988   Diagnosed in her 33s.  Managed by Endocrinologist   Lumbar radiculopathy  04/2019   Dr. Katrinka Blazing to get plain films of LB and hip (considering MRI due to her hx of cancer)   Nonischemic cardiomyopathy (HCC)    Hx of takotsubo CM   Osteoporosis 2015   pt states "osteopenia", but then says that she refused to take the rx med for this condition, so I suspect she had osteoporosis.   Peripheral neuropathy 2017   Patient states diabetic neuropathy in feet prior to starting chemotherapy and then worsened by chemo.    SCC (squamous cell carcinoma), arm, right    Syncope and collapse    05/2021 while in [place].  No prodrome.  Monitor ordered and cardiology referral ordered 05/14/21   TIA (transient ischemic attack) 03/26/2011   2012: question of (HA + R eye "floaters"). CT in ED neg acute. Not admitted, no f/u testing done.  ?ocular migraine?   Past Surgical History:  Procedure Laterality Date   ABDOMINAL HYSTERECTOMY  1972   APPENDECTOMY  1972   BREAST ENHANCEMENT SURGERY  1982   BREAST IMPLANT REMOVAL Right 09/13/2016   Procedure: REMOVAL RIGHT BREAST IMPLANT;  Surgeon: Glenna Fellows, MD;  Location: [facility];  Service: Plastics;  Laterality: Right;   BREAST LUMPECTOMY WITH RADIOACTIVE SEED AND SENTINEL LYMPH NODE BIOPSY Right 09/13/2016   Procedure: RIGHT BREAST LUMPECTOMY WITH RADIOACTIVE SEED X 2  AND SENTINEL LYMPH NODE BIOPSY;  Surgeon: Ovidio Kin, MD;  Location: [facility];  Service: General;  Laterality: Right;   BREAST SURGERY Right 03/14/2016   Biopsy   CAPSULECTOMY Right 09/13/2016   Procedure: RIGHT CAPSULECTOMY;  Surgeon: Glenna Fellows, MD;  Location: [facility];  Service: Plastics;  Laterality: Right;   CATARACT EXTRACTION, BILATERAL Bilateral 08/10/17 right eye, 08/31/17 left eye   MASS EXCISION Left 02/28/2018   Path: benign.  Procedure: EXCISIONLEFT MEDIAL THIGH MASS ERAS PATHWAY;  Surgeon: Harriette Bouillon, MD;  Location: [facility];  Service: General;  Laterality: Left;   PORTACATH  PLACEMENT N/A 03/15/2016   Procedure: INSERTION PORT-A-CATH WITH [place];  Surgeon: Ovidio Kin, MD;  Location: [facility];  Service: General;  Laterality: N/A;   PORTACATH REMOVAL  07/2017   SLEEP STUDY  09/2021   NO SLEEP APNEA   surgical repair left ankle Left 2009   s/p Fall    TONSILLECTOMY AND ADENOIDECTOMY  1948   Age 12   TOTAL HIP ARTHROPLASTY Left 10/06/2021   Procedure: TOTAL HIP ARTHROPLASTY ANTERIOR APPROACH;  Surgeon: Eldred Manges, MD;  Location: [facility];  Service: Orthopedics;  Laterality: Left;   US CAROTID DOPPLER BILATERAL ([facility] HX)  01/2021   <50% bilat int carotid sten, otherwise normal.   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   Closed fracture of proximal end of left femur, initial encounter (HCC) 10/06/2021   Hyperkalemia 10/06/2021   Alcohol use 10/06/2021   Malnutrition of moderate degree 10/06/2021   Syncope and collapse 06/10/2021   Closed fracture of multiple ribs of right side 04/30/2021   Acquired hypothyroidism 02/23/2021   Type 2 diabetes mellitus with diabetic polyneuropathy, without long-term current use of insulin (HCC) 02/23/2021   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   Diabetes mellitus type 2 without retinopathy (HCC) 09/21/2020   Meibomian gland dysfunction (MGD) of both eyes 09/21/2020   History of COVID-19 06/23/2020   COVID-19 virus infection 05/27/2020   Piriformis syndrome of right side 05/16/2019   Lower extremity pain, right 04/23/2019   DVT (deep venous thrombosis) (HCC) 04/23/2019   Lumbar radiculopathy, right 04/02/2019   Cataracts, bilateral 07/2017   Other long term (current) drug therapy 06/14/2017   Anxiety and depression    History of breast cancer 03/02/2017   History of therapeutic radiation 03/02/2017   Breast pain, right 10/03/2016   Type 2 diabetes mellitus (HCC) 08/23/2016   Hypertension associated with diabetes (HCC) 08/23/2016   Hypothyroidism 08/23/2016   Chemotherapy-induced  neuropathy (HCC) 07/04/2016   Port catheter in place 05/09/2016   Peripheral neuropathy 05/09/2016   Breast cancer of upper-inner quadrant of right female breast (HCC) 03/09/2016   Major depressive disorder, recurrent (HCC) 02/27/2014   Uncomplicated alcohol dependence (HCC) 02/19/2014   Osteoporosis 2015   GERD (gastroesophageal reflux disease) 2013   TIA (transient ischemic attack) 03/26/2011   Epidermoid cyst of vulva 2008   Hyperlipidemia associated with type 2 diabetes mellitus (HCC) 1986    PCP: Jeoffrey Massed, MD  REFERRING PROVIDER: Eldred Manges, MD  REFERRING DIAG:  M54.50 (ICD-10-CM) - Acute midline low back pain, unspecified whether sciatica present  M25.552 (ICD-10-CM) - Pain in left hip   RATIONALE FOR EVALUATION AND TREATMENT: Rehabilitation  THERAPY DIAG:  Muscle weakness (generalized)  Unsteadiness on feet  Other low back pain  ONSET DATE: this year since right before hip surgery  NEXT MD VISIT: around Feb  SUBJECTIVE:                                                                                                                                                                                           SUBJECTIVE STATEMENT: Pt reports she has had a couple of stumbles but no falls.  PAIN:  Are you having pain? No  PERTINENT HISTORY:  L THA, 10/06/21, peripheral neuropathy, Breast CA 2017, anxiety, depression, DVT R popliteal, DM, TIA 2012, 2 heart caths for broken heart disease  PRECAUTIONS: Fall  WEIGHT BEARING RESTRICTIONS: No  FALLS:  Has patient fallen in last 6 months? Yes. Number of falls 2 .September tripped over a painter's cloth, also trash bin started rolling and she couldn't keep up with it  LIVING ENVIRONMENT: Lives with: lives alone Lives in: House/apartment Stairs: Yes: Internal: 13 steps; walks around house to basement rather than use stairs. 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  LOWER EXTREMITY ROM:    WNL except bil hip IR L 24 deg, R 27 deg  LOWER EXTREMITY MMT:    MMT Right eval Left eval Right 06/16/22 Left 06/16/22  Hip flexion 4 4- 4 4  Hip extension 5 5 4+ 4+  Hip abduction 5 5 5 5   Hip adduction 5 5 4+ 5  Hip internal rotation   5 5  Hip external rotation   5 5  Knee flexion 5 4+ 5 5  Knee extension 5 5 5 5   Ankle dorsiflexion 5 5 5 5   Ankle Plantarflexion   5 5   (Blank rows = not tested)  FUNCTIONAL TESTS:  5 times sit to stand: 19.83 seconds 10 meter walk test: 9.72 sec; Gait speed = 3.37 ft/sec (05/18/22) Berg Balance Scale: 47/56 indicates moderate >50% fall risk; cane outdoors; 06/16/22 scored 54/56 indicating lower fall risk  Functional gait assessment: 19/30; < 19 = high risk fall (05/18/22); 24/30 on 07/05/22  GAIT: WNL  TODAY'S TREATMENT:                                                                                                                              DATE:    07/05/22 THERAPEUTIC EXERCISE: to improve flexibility, strength and mobility.  Verbal and tactile cues throughout for technique. Recumbent Bike L2x47min FGA Assessed ability to step up and down curb 5xSTS  Seated knee extension GTB x 10 bil Standing hip abduction x 10 GTB bil Standing hip extension x 10 GTB  bil   06/23/22 THERAPEUTIC EXERCISE: to improve flexibility, strength and mobility.  Verbal and tactile cues throughout for technique. NuStep L5 x6' Step Ups 8" Fwd/Bwd with raising contralateral knee x10 bilat; Lateral step-up 6" & had to use ski pole for support; raising contralateral knee x10 bilat  Standing Marching w/ GTB around midfoot 2x10 bilat  Monster Walk w/ GTB down and back x3 Seated Knee Flexion/Extension #20 for flexion, #15 for extension; 2x10 bilat   NEUROMUSCULAR RE-EDUCATION: To improve posture, balance, proprioception, coordination, and reduce fall risk Tandem Balance x30": EO firm surface & airex EC firm surface & airex  SLS + Cone taps-3 cones: down and back x5 bilat, once with arm chair support & once without SLS + Cone knockdown and upright- x10 bilat    06/16/22 THERAPEUTIC EXERCISE: to improve flexibility, strength and mobility.  Verbal and tactile cues throughout for technique.  Rec Bike x6' L2  Standing March RTB around midfoot 2x10 bilat  Side Stepping RTB on ankles 58ft (x4 with counter support; x4 without counter support)  Monster/Reverse Monster Walk RTB on ankles 58ft (x4 without counter support)   THERAPEUTIC ACTIVITIES: Berg Balance Test: 54/56   PATIENT EDUCATION:  Education details: progress with PT, HEP progression - GTB to [employer], and HEP review  Person educated: Patient Education method: Explanation Education comprehension: verbalized understanding  HOME EXERCISE PROGRAM: Access Code: [email or web address] URL: [email or web address] Date: 06/13/2022 Prepared by: Verta Ellen  Exercises - Sit to Stand  - 3 x daily - 7 x weekly - 1-2 sets - 5-10 reps - Tandem Stance with Support  - 2 x daily - 7 x weekly - 1 sets - 5 reps - max hold - Standing Shoulder PNF D2 with Trunk Rotation  - 2 x daily - 7 x weekly - 2 sets - 10 reps - Standing with Head Rotation  - 2 x daily - 7 x weekly - 1-2 sets - 5-10 reps - Standing Hip  Abduction with Resistance at Ankles and Counter Support  - 1 x daily - 3-4 x weekly - 2 sets - 10 reps - Standing Hip Extension with Resistance at Ankles and Counter Support  - 1 x daily - 3-4 x weekly - 2 sets - 10 reps - Standing Hip Flexion with Resistance at Ankles and Counter Support  - 1 x daily - 3-4 x weekly - 2 sets - 10 reps  Patient Education -  Check for Safety   ASSESSMENT:  CLINICAL IMPRESSION:  Pt today showed improvement in FGA score by 5 points and 5x STS score by 4 points. LTG #3 was met for being able to step up/down curb safely. LTG #5 was also met for FGA score. She showed improved score on BERG balance test last time assessed. Reviewed HEP to ensure understanding. She has met all LTGs except #4 and #6. Will be placed on 30 day hold as of today.  OBJECTIVE IMPAIRMENTS: decreased balance, decreased strength, decreased safety awareness, impaired flexibility, impaired sensation, and postural dysfunction.   ACTIVITY LIMITATIONS: stairs  PARTICIPATION LIMITATIONS: yard work  PERSONAL FACTORS: Age, Behavior pattern, Past/current experiences, and 3+ comorbidities: L THA, 10/06/21, peripheral neuropathy, Breast CA 2017, anxiety, depression, DVT R popliteal, DM, TIA 2012,  are also affecting patient's functional outcome.   REHAB POTENTIAL: Excellent  CLINICAL DECISION MAKING: Evolving/moderate complexity  EVALUATION COMPLEXITY: Moderate   GOALS: Goals reviewed with patient? Yes  SHORT TERM GOALS: Target date: 06/07/2022   Patient will be independent with initial HEP. Baseline:  Goal status: MET  05/25/22  2.  DGI and/or FGA to be assessed by 05/24/22. Baseline:  Goal status: MET  05/18/22  3.  Patient will be educated on strategies to decrease risk of falls.  Baseline:  Goal status: MET  05/18/22  LONG TERM GOALS: Target date: 07/05/2022   Patient will be independent with advanced/ongoing HEP to improve outcomes and carryover.  Baseline:  Goal status: MET  07/05/22  2.  Patient will be able perform sit to stand transfer without UE support to ease commode transfers. Baseline:  Goal status: MET  06/07/22  3.  Patient will be able to step up/down curb safely with LRAD for safety with community ambulation.  Baseline:  Goal status: MET  07/05/22   4.  Patient will demonstrate improved functional LE strength as demonstrated by improved 5xSTS to </= 12.6 seconds (norm for age). Baseline: 19.83 sec Goal status: PARTIALLY MET  07/05/22 - 15 sec (improved from baseline but not to goal level)  5.  Patient will demonstrate at least 23/30 on FGA to improve gait stability and reduce risk for falls. Baseline: 19/30 (05/18/22) Goal status: MET 07/05/22  6.  Patient will score 56/56 on Berg Balance test to demonstrate lower risk of falls.  Baseline: 47/56 on eval; 54/56 on 06/16/22 Goal status: PARTIALLY MET  07/05/22 - not assessed but improved to 54/56 as of 06/16/22  7.  Patient will be able to perform floor to stand transfer without use of support other than UEs. Baseline:  Goal status: MET 06/16/22  PLAN:  PT FREQUENCY: 2x/week  PT DURATION: 8 weeks  PLANNED INTERVENTIONS: Therapeutic exercises, Therapeutic activity, Neuromuscular re-education, Balance training, Gait training, Patient/Family education, Self Care, Joint mobilization, Spinal mobilization, Cryotherapy, Moist heat, and Manual therapy.  PLAN FOR NEXT SESSION: Transition to HEP + 30-day hold   Darleene Cleaver, PTA 07/05/2022, 11:57 AM   PHYSICAL THERAPY DISCHARGE SUMMARY  Visits from Start of Care: 9  Current functional level related to goals / functional outcomes:   Refer to above clinical impression and goal assessment for status as of last visit on 07/05/22. Patient was placed on hold for 30 days and has not needed to return to PT, therefore will proceed with discharge from PT for this episode.     Remaining deficits:   As above.    Education / Equipment:   HEP   Patient  agrees to discharge. Patient  goals were mostly met. Patient is being discharged due to being pleased with the current functional level.  Marry Guan, PT, MPT 09/29/22, 4:20 PM  [facility] [address]  [address] [place], [place], [address] Phone: [phone]   Fax:  [phone]

## 2022-07-08 ENCOUNTER — Ambulatory Visit (INDEPENDENT_AMBULATORY_CARE_PROVIDER_SITE_OTHER): Payer: Medicare Other | Admitting: Family Medicine

## 2022-07-08 ENCOUNTER — Encounter: Payer: Self-pay | Admitting: Family Medicine

## 2022-07-08 VITALS — BP 158/77 | HR 59 | Temp 98.1°F | Ht 68.0 in | Wt 155.0 lb

## 2022-07-08 DIAGNOSIS — F4323 Adjustment disorder with mixed anxiety and depressed mood: Secondary | ICD-10-CM | POA: Diagnosis not present

## 2022-07-08 DIAGNOSIS — I10 Essential (primary) hypertension: Secondary | ICD-10-CM | POA: Diagnosis not present

## 2022-07-08 DIAGNOSIS — F1010 Alcohol abuse, uncomplicated: Secondary | ICD-10-CM | POA: Diagnosis not present

## 2022-07-08 DIAGNOSIS — F411 Generalized anxiety disorder: Secondary | ICD-10-CM

## 2022-07-08 MED ORDER — CLONIDINE HCL 0.1 MG PO TABS: 0.1000 mg | ORAL_TABLET | Freq: Three times a day (TID) | ORAL | 1 refills | Status: DC

## 2022-07-08 MED ORDER — VALSARTAN 160 MG PO TABS: 160.0000 mg | ORAL_TABLET | Freq: Every day | ORAL | 1 refills | Status: DC

## 2022-07-08 MED ORDER — DULOXETINE HCL 60 MG PO CPEP: 60.0000 mg | ORAL_CAPSULE | Freq: Every day | ORAL | 1 refills | Status: DC

## 2022-07-08 NOTE — Progress Notes (Signed)
OFFICE VISIT  07/08/2022  CC:  Chief Complaint  Patient presents with   Medical Management of Chronic Issues    Patient is a 82 y.o. female with hypertension, alcoholism, and recurrent major depressive disorder with anxiety who presents for anxiety.  HPI: Lots of increased stress, worry, concentration/focus issues lately, gradually worsening.  Impairs functioning at times, gets her very frustrated.  This causes her mood to go down. Still drinking a bottle of wine every night, she feels like this is less than she had been drinking. Gets about 4 hours of sleep per night, not taking the trazodone lately. Denies panic attacks.  Overall, she describes herself as being overwhelmed.  She still works 6 days a week, drives for 45 minutes to work each way, is taking care of her home.  No home blood pressure monitoring lately. Had some palpitations and chest heaviness yesterday in the setting of lots of increased feeling of stress and anxiety.  PMP AWARE reviewed today: most recent rx for alprazolam was filled 07/15/2021, # 20, rx by a provider in [facility]. No red flags.  ROS as above, plus--> no fevers, no CP, no SOB, no wheezing, no cough, no dizziness, no HAs, no rashes, no melena/hematochezia.  No polyuria or polydipsia.  No myalgias or arthralgias.  No focal weakness, paresthesias, or tremors.  No acute vision or hearing abnormalities.  No dysuria or unusual/new urinary urgency or frequency.  No recent changes in lower legs. No n/v/d or abd pain.      Past Medical History:  Diagnosis Date   Acute DVT (deep venous thrombosis) ([place]) 04/23/2019   Right popliteal   Alcoholism ([place])    Anxiety and depression 1964   oncologist started duloxetine 12/2016   Breast cancer ([place]) 03/14/2016   Clinical stage 2A: (triple neg): Right breast, upper inner quadrant, 03/2016.  Neoadjuvant chemo x 5 cycles,lumpectomy 4 mo later, then RT started 10/2016.  Adjuvant Xeloda [id number].  SWOG  research trial pt 04/2017--pt randomized to pembrolizumab immunotherapy.  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  A1c Mar 12, 2018 was 7.0% at Dr. Shirlyn Goltz.    Epidermoid cyst of vulva    Chronic epidermoid cyst of the vulva.  Excision done 02/2019   GERD (gastroesophageal reflux disease) 2013   History of basal cell cancer    L ankle   Hyperlipidemia 1986   Hypertension 2008   2022 ->addition of hctz to lisinopril led to 74m drop in GFR. HCTZ d/cd.   Hypothyroidism 1988   Diagnosed in her 451s  Managed by Endocrinologist   Lumbar radiculopathy 04/2019   Dr. STamala Julianto get plain films of LB and hip (considering MRI due to her hx of cancer)   Nonischemic cardiomyopathy ([place]    Hx of takotsubo CM   Osteoporosis 2015   pt states "osteopenia", but then says that she refused to take the rx med for this condition, so I suspect she had osteoporosis.   Peripheral neuropathy 2017   Patient states diabetic neuropathy in feet prior to starting chemotherapy and then worsened by chemo.    SCC (squamous cell carcinoma), arm,  right    Syncope and collapse    05/2021 while in [place].  No prodrome.  Monitor ordered and cardiology referral ordered 05/14/21   TIA (transient ischemic attack) 03/26/2011   2012: question of (HA + R eye "floaters"). CT in ED neg acute. Not admitted, no f/u testing done.  ?ocular migraine?    Past Surgical History:  Procedure Laterality Date   ABDOMINAL HYSTERECTOMY  1972   APPENDECTOMY  1972   BREAST ENHANCEMENT SURGERY  1982   BREAST IMPLANT REMOVAL Right 09/13/2016   Procedure: REMOVAL RIGHT BREAST  IMPLANT;  Surgeon: Irene Limbo, MD;  Location: [facility] [facility];  Service: Plastics;  Laterality: Right;   BREAST LUMPECTOMY WITH RADIOACTIVE SEED AND SENTINEL LYMPH NODE BIOPSY Right 09/13/2016   Procedure: RIGHT BREAST LUMPECTOMY WITH RADIOACTIVE SEED X 2 AND SENTINEL LYMPH NODE BIOPSY;  Surgeon: Alphonsa Overall, MD;  Location: [place];  Service: General;  Laterality: Right;   BREAST SURGERY Right 03/14/2016   Biopsy   CAPSULECTOMY Right 09/13/2016   Procedure: RIGHT CAPSULECTOMY;  Surgeon: Irene Limbo, MD;  Location: [place];  Service: Plastics;  Laterality: Right;   CATARACT EXTRACTION, BILATERAL Bilateral 08/10/17 right eye, 08/31/17 left eye   MASS EXCISION Left 02/28/2018   Path: benign.  Procedure: EXCISIONLEFT MEDIAL THIGH MASS ERAS PATHWAY;  Surgeon: Erroll Luna, MD;  Location: [place];  Service: General;  Laterality: Left;   PORTACATH PLACEMENT N/A 03/15/2016   Procedure: INSERTION PORT-A-CATH WITH [place];  Surgeon: Alphonsa Overall, MD;  [facility];  Service: General;  Laterality: N/A;   PORTACATH REMOVAL  07/2017   SLEEP STUDY  09/2021   NO SLEEP APNEA   surgical repair left ankle Left 2009   s/p [place]   Age 46   TOTAL HIP ARTHROPLASTY Left 10/06/2021   Procedure: TOTAL HIP ARTHROPLASTY ANTERIOR APPROACH;  Surgeon: [name], MD;  [facility];  Service: Orthopedics;  Laterality: Left;   US CAROTID DOPPLER BILATERAL ([place] HX)  01/2021   <50% bilat int carotid sten, otherwise normal.    Outpatient Medications Prior to Visit  Medication Sig Dispense Refill   atorvastatin (LIPITOR) 80 MG tablet Take 1 tablet (80 mg total) by mouth every evening. 90 tablet 3   carvedilol (COREG) 3.125 MG tablet Take 1 tablet (3.125 mg total) by mouth 2 (two) times daily with a meal. 60 tablet 0   cholecalciferol (VITAMIN D3) 25 MCG (1000 UNIT) tablet Take 1,000 Units by mouth daily.      folic acid (FOLVITE) 1 MG tablet Take 1 tablet (1 mg total) by mouth daily.     metFORMIN (GLUCOPHAGE-XR) 500 MG 24 hr tablet Take 2 tablets (1,000 mg total) by mouth daily before supper. 180 tablet 3   Multiple Vitamin (MULTIVITAMIN WITH MINERALS) TABS tablet Take 1 tablet by mouth daily.     Propylene Glycol (SYSTANE BALANCE OP) Place 1 drop into both eyes daily as needed (for dry eyes).     SYNTHROID 112 MCG tablet Take 1 tablet (112 mcg total) by mouth daily before breakfast. 90 tablet 2   thiamine 100 MG tablet Take 1 tablet (100 mg total) by mouth daily.     traZODone (DESYREL) 50 MG tablet Take 1 tablet (50 mg total) by mouth at bedtime. 90 tablet 1   DULoxetine (CYMBALTA) 60 MG capsule Take 1 capsule (60 mg total) by mouth daily. 30 capsule 0  valsartan (DIOVAN) 160 MG tablet Take 1 tablet (160 mg total) by mouth daily. 30 tablet 0   OVER THE COUNTER MEDICATION Take 400 mg by mouth daily. Palmitoylethanolamide - suggested by Oncologist, study drug for neuropathy (Patient not taking: Reported on 07/08/2022)     No facility-administered medications prior to visit.    Allergies  Allergen Reactions   Other Other (See Comments)    STEROIDS- emotional   Prednisone Other (See Comments)    Other reaction(s): Mental Status Changes (intolerance)   Amlodipine Other (See Comments)    Elevated creatinine   Hydrochlorothiazide Other (See Comments)    Elevated serum creatinine   Penicillins Other (See Comments)    Unsure of reaction, was 82 years old    Review of Systems  As per HPI  PE:    07/08/2022   11:00 AM 07/08/2022   10:53 AM 05/18/2022    2:08 PM  Vitals with BMI  Height  '5\' 8"'$  '5\' 8"'$   Weight  155 lbs 156 lbs 3 oz  BMI  26.71 24.58  Systolic 099 833 825  Diastolic 77 68 80  Pulse  59 73     Physical Exam  Gen: Alert, well appearing.  Patient is oriented to person, place, time, and situation. AFFECT: pleasant, lucid thought and speech. CV: RRR (rate 60-65), no  murmur. Lungs clear No edema  LABS:  Last CBC Lab Results  Component Value Date   WBC 4.2 05/02/2022   HGB 12.9 05/02/2022   HCT 37.7 05/02/2022   MCV 96.2 05/02/2022   MCH 32.9 05/02/2022   RDW 13.0 05/02/2022   PLT 171 05/39/7673   Last metabolic panel Lab Results  Component Value Date   GLUCOSE 275 (H) 05/02/2022   NA 135 05/02/2022   K 5.3 (H) 05/02/2022   CL 99 05/02/2022   CO2 28 05/02/2022   BUN 24 (H) 05/02/2022   CREATININE 1.00 05/02/2022   GFRNONAA 57 (L) 05/02/2022   CALCIUM 10.0 05/02/2022   PROT 7.4 05/02/2022   ALBUMIN 4.8 05/02/2022   BILITOT 1.8 (H) 05/02/2022   ALKPHOS 70 05/02/2022   AST 22 05/02/2022   ALT 29 05/02/2022   ANIONGAP 8 05/02/2022   Last hemoglobin A1c Lab Results  Component Value Date   HGBA1C 7.0 (A) 05/18/2022   Last thyroid functions Lab Results  Component Value Date   TSH 0.05 (L) 05/18/2022   T4TOTAL 6.6 05/21/2018   IMPRESSION AND PLAN:  #1 GAD, recurrent major depressive disorder. Discussed options today.  Decided to continue with duloxetine 60 mg a day and add clonidine 0.1 mg 3 times daily for anxiety/focus/impulsivity and hopefully for some added blood pressure control. Therapeutic expectations and side effect profile of medication discussed today.  Patient's questions answered. Cautioned her about excessive use of alcohol.  Will avoid benzos.  She does have trazodone to use for insomnia but she does not find this all that helpful. She has been on buspirone in the past but does not recall that it helped very much.  However, she is willing to try this again if needed.  2.  Hypertension, uncontrolled. As per #1 above, adding clonidine 0.1 mg 3 times daily. In future could go up on valsartan dosing. Encouraged her to monitor blood pressure and heart rate daily.  An After Visit Summary was printed and given to the patient.  FOLLOW UP: Return for 7-10d f/u anxiety and bp-->virtual or in person.  Signed:  Crissie Sickles, MD  07/08/2022  

## 2022-07-15 ENCOUNTER — Encounter: Payer: Self-pay | Admitting: Family Medicine

## 2022-07-15 ENCOUNTER — Telehealth (INDEPENDENT_AMBULATORY_CARE_PROVIDER_SITE_OTHER): Payer: Medicare Other | Admitting: Family Medicine

## 2022-07-15 VITALS — BP 190/87 | HR 57

## 2022-07-15 DIAGNOSIS — I10 Essential (primary) hypertension: Secondary | ICD-10-CM | POA: Diagnosis not present

## 2022-07-15 MED ORDER — CLONIDINE HCL 0.1 MG PO TABS: ORAL_TABLET | ORAL | 1 refills | Status: DC

## 2022-07-15 MED ORDER — FELODIPINE ER 10 MG PO TB24
10.0000 mg | ORAL_TABLET | Freq: Every day | ORAL | 0 refills | Status: DC
Start: 2022-07-15 — End: 2022-08-26

## 2022-07-15 NOTE — Progress Notes (Signed)
Virtual Visit via Video Note  I connected with Marissa Hall  on 07/15/22 at 10:00 AM EST by a video enabled telemedicine application and verified that I am speaking with the correct person using two identifiers.  Location patient: [place] Location provider:work or home office Persons participating in the virtual visit: patient, provider  I discussed the limitations and requested verbal permission for telemedicine visit. The patient expressed understanding and agreed to proceed.  CC:  82 year old female being seen today for 1 week follow-up anxiety and uncontrolled hypertension. A/P as of last visit: "#1 GAD, recurrent major depressive disorder. Discussed options today.  Decided to continue with duloxetine 60 mg a day and add clonidine 0.1 mg 3 times daily for anxiety/focus/impulsivity and hopefully for some added blood pressure control. Therapeutic expectations and side effect profile of medication discussed today.  Patient's questions answered. Cautioned her about excessive use of alcohol.  Will avoid benzos.  She does have trazodone to use for insomnia but she does not find this all that helpful. She has been on buspirone in the past but does not recall that it helped very much.  However, she is willing to try this again if needed.   2.  Hypertension, uncontrolled. As per #1 above, adding clonidine 0.1 mg 3 times daily. In future could go up on valsartan dosing. Encouraged her to monitor blood pressure and heart rate daily.  INTERIM HX: Clonidine during the day caused her oversedation. Average blood pressure around 190/90 lately. She feels better from a mood and anxiety standpoint. She does have headaches in the morning.   ROS: See pertinent positives and negatives per HPI.  Past Medical History:  Diagnosis Date   Acute DVT (deep venous thrombosis) ([place]) 04/23/2019   Right popliteal   Alcoholism ([place])    Anxiety and depression 1964   oncologist started duloxetine 12/2016   Breast  cancer ([place]) 03/14/2016   Clinical stage 2A: (triple neg): Right breast, upper inner quadrant, 03/2016.  Neoadjuvant chemo x 5 cycles,lumpectomy 4 mo later, then RT started 10/2016.  Adjuvant Xeloda [id number].  SWOG research trial pt 04/2017--pt randomized to pembrolizumab immunotherapy.  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  A1c Mar 12, 2018 was 7.0% at Dr. Shirlyn Goltz.    Epidermoid cyst of vulva    Chronic epidermoid cyst of the vulva.  Excision done 02/2019   GERD (gastroesophageal reflux disease) 2013   History of basal cell cancer    L ankle   Hyperlipidemia 1986   Hypertension 2008   2022 ->addition of hctz to lisinopril led to 11ml drop in GFR. HCTZ d/cd.   Hypothyroidism 1988   Diagnosed in her 39s.  Managed by Endocrinologist   Lumbar radiculopathy 04/2019   Dr. Tamala Julian to get plain films of LB and hip (considering MRI due to her hx of cancer)   Nonischemic cardiomyopathy ([place])    Hx of takotsubo CM   Osteoporosis 2015   pt states "osteopenia", but then says that she refused to take the rx med for this condition, so I suspect she had osteoporosis.   Peripheral neuropathy 2017   Patient states diabetic neuropathy in  feet prior to starting chemotherapy and then worsened by chemo.    SCC (squamous cell carcinoma), arm, right    Syncope and collapse    05/2021 while in [place].  No prodrome.  Monitor ordered and cardiology referral ordered 05/14/21   TIA (transient ischemic attack) 03/26/2011   2012: question of (HA + R eye "floaters"). CT in ED neg acute. Not admitted, no f/u testing done.  ?ocular migraine?    Past  Surgical History:  Procedure Laterality Date   ABDOMINAL HYSTERECTOMY  1972   APPENDECTOMY  1972   BREAST ENHANCEMENT SURGERY  1982   BREAST IMPLANT REMOVAL Right 09/13/2016   Procedure: REMOVAL RIGHT BREAST IMPLANT;  Surgeon: Irene Limbo, MD;  Location: [facility];  Service: Plastics;  Laterality: Right;   BREAST LUMPECTOMY WITH RADIOACTIVE SEED AND SENTINEL LYMPH NODE BIOPSY Right 09/13/2016   Procedure: RIGHT BREAST LUMPECTOMY WITH RADIOACTIVE SEED X 2 AND SENTINEL LYMPH NODE BIOPSY;  Surgeon: Alphonsa Overall, MD;  Location: [facility];  Service: General;  Laterality: Right;   BREAST SURGERY Right 03/14/2016   Biopsy   CAPSULECTOMY Right 09/13/2016   Procedure: RIGHT CAPSULECTOMY;  Surgeon: Irene Limbo, MD;  Location: [facility];  Service: Plastics;  Laterality: Right;   CATARACT EXTRACTION, BILATERAL Bilateral 08/10/17 right eye, 08/31/17 left eye   MASS EXCISION Left 02/28/2018   Path: benign.  Procedure: EXCISIONLEFT MEDIAL THIGH MASS ERAS PATHWAY;  Surgeon: Erroll Luna, MD;  Location: [facility];  Service: General;  Laterality: Left;   PORTACATH PLACEMENT N/A 03/15/2016   Procedure: INSERTION PORT-A-CATH WITH [place];  Surgeon: Alphonsa Overall, MD;  [facility];  Service: General;  Laterality: N/A;   PORTACATH REMOVAL  07/2017   SLEEP STUDY  09/2021   NO SLEEP APNEA   surgical repair left ankle Left 2009   s/p [place]   Age 81   TOTAL HIP ARTHROPLASTY Left 10/06/2021   Procedure: TOTAL HIP ARTHROPLASTY ANTERIOR APPROACH;  Surgeon: [name], MD;  [facility];  Service: Orthopedics;  Laterality: Left;   US CAROTID DOPPLER BILATERAL ([place] HX)  01/2021   <50% bilat int carotid sten, otherwise normal.    Allergies  Allergen Reactions   Other Other (See Comments)    STEROIDS- emotional   Prednisone Other (See Comments)    Other reaction(s): Mental Status Changes  (intolerance)   Amlodipine Other (See Comments)    Elevated creatinine   Hydrochlorothiazide Other (See Comments)    Elevated serum creatinine   Penicillins Other (See Comments)    Unsure of reaction, was 82 years old     Current Outpatient Medications:    atorvastatin (LIPITOR) 80 MG tablet, Take 1 tablet (80 mg total) by mouth every evening., Disp: 90 tablet, Rfl: 3   carvedilol (COREG) 3.125 MG tablet, Take 1 tablet (3.125 mg total) by mouth 2 (two) times daily with a meal., Disp: 60 tablet, Rfl: 0   cholecalciferol (VITAMIN D3) 25 MCG (1000 UNIT) tablet, Take 1,000 Units by mouth daily., Disp: , Rfl:    DULoxetine (CYMBALTA) 60 MG capsule, Take 1 capsule (60 mg total) by mouth daily., Disp: 90 capsule, Rfl: 1   folic acid (FOLVITE) 1 MG tablet, Take 1 tablet (1 mg total) by mouth daily., Disp: , Rfl:    metFORMIN (GLUCOPHAGE-XR) 500 MG 24 hr tablet, Take 2 tablets (1,000 mg total) by mouth daily before supper., Disp: 180 tablet, Rfl: 3  Multiple Vitamin (MULTIVITAMIN WITH MINERALS) TABS tablet, Take 1 tablet by mouth daily., Disp: , Rfl:    Propylene Glycol (SYSTANE BALANCE OP), Place 1 drop into both eyes daily as needed (for dry eyes)., Disp: , Rfl:    SYNTHROID 112 MCG tablet, Take 1 tablet (112 mcg total) by mouth daily before breakfast., Disp: 90 tablet, Rfl: 2   thiamine 100 MG tablet, Take 1 tablet (100 mg total) by mouth daily., Disp: , Rfl:    traZODone (DESYREL) 50 MG tablet, Take 1 tablet (50 mg total) by mouth at bedtime., Disp: 90 tablet, Rfl: 1   valsartan (DIOVAN) 160 MG tablet, Take 1 tablet (160 mg total) by mouth daily., Disp: 90 tablet, Rfl: 1   cloNIDine (CATAPRES) 0.1 MG tablet, Take 1 tablet (0.1 mg total) by mouth 3 (three) times daily. (Patient not taking: Reported on 07/15/2022), Disp: 90 tablet, Rfl: 1  EXAM:  VITALS per patient if applicable:     [id number]   10:02 AM 07/08/2022   11:00 AM 07/08/2022   10:53 AM  Vitals with BMI  Height   5\' 8"   Weight   155  lbs  BMI   123456  Systolic [id number]  Diastolic 87 77 68  Pulse 57  59    GENERAL: alert, oriented, appears well and in no acute distress  HEENT: atraumatic, conjunttiva clear, no obvious abnormalities on inspection of external nose and ears  NECK: normal movements of the head and neck  LUNGS: on inspection no signs of respiratory distress, breathing rate appears normal, no obvious gross SOB, gasping or wheezing  CV: no obvious cyanosis  MS: moves all visible extremities without noticeable abnormality  PSYCH/NEURO: pleasant and cooperative, no obvious depression or anxiety, speech and thought processing grossly intact  LABS: none today    Chemistry      Component Value Date/Time   NA 135 05/02/2022 1128   NA 136 05/01/2017 0914   K 5.3 (H) 05/02/2022 1128   K 4.5 05/01/2017 0914   CL 99 05/02/2022 1128   CO2 28 05/02/2022 1128   CO2 24 05/01/2017 0914   BUN 24 (H) 05/02/2022 1128   BUN 14.9 05/01/2017 0914   CREATININE 1.00 05/02/2022 1128   CREATININE 0.8 05/01/2017 0914   GLU 181 09/20/2019 0000      Component Value Date/Time   CALCIUM 10.0 05/02/2022 1128   CALCIUM 9.6 05/01/2017 0914   ALKPHOS 70 05/02/2022 1128   ALKPHOS 75 05/01/2017 0914   AST 22 05/02/2022 1128   AST 51 (H) 05/01/2017 0914   ALT 29 05/02/2022 1128   ALT 62 (H) 05/01/2017 0914   BILITOT 1.8 (H) 05/02/2022 1128   BILITOT 1.30 (H) 05/01/2017 0914     ASSESSMENT AND PLAN:  Discussed the following assessment and plan:  #1 uncontrolled hypertension. Change clonidine back to 0.1 mg nightly only. Add felodipine 10 mg a day. Continue valsartan 160 mg a day and carvedilol 3.125 mg twice a day. Did not tolerate trial of clonidine. Her hyperkalemia precludes any increase in valsartan or use of spironolactone.  #2 recurrent major depressive disorder and generalized anxiety disorder. She is stable on duloxetine 60 mg a day.  I discussed the assessment and treatment plan with the patient.  The patient was provided an opportunity to ask questions and all were answered. The patient agreed with the plan and demonstrated an understanding of the instructions.   F/u: 1 wk  Signed:  Crissie Sickles, MD  07/15/2022  

## 2022-08-18 ENCOUNTER — Other Ambulatory Visit: Payer: Self-pay | Admitting: Family Medicine

## 2022-08-19 NOTE — Telephone Encounter (Signed)
Noted  

## 2022-08-26 MED ORDER — VALSARTAN 160 MG PO TABS: 160.0000 mg | ORAL_TABLET | Freq: Every day | ORAL | 0 refills | Status: DC

## 2022-08-26 NOTE — Addendum Note (Signed)
Addended by: Deveron Furlong D on: 08/26/2022 10:02 AM   Modules accepted: Orders

## 2022-08-26 NOTE — Telephone Encounter (Signed)
Pt is now scheduled for 08/31/22 at 3:20. She wanted me to inform Vevelyn Royals this has been completed.

## 2022-08-26 NOTE — Telephone Encounter (Signed)
Noted  

## 2022-08-31 ENCOUNTER — Telehealth (INDEPENDENT_AMBULATORY_CARE_PROVIDER_SITE_OTHER): Payer: Medicare Other | Admitting: Family Medicine

## 2022-08-31 ENCOUNTER — Encounter: Payer: Self-pay | Admitting: Family Medicine

## 2022-08-31 VITALS — BP 194/89

## 2022-08-31 DIAGNOSIS — I10 Essential (primary) hypertension: Secondary | ICD-10-CM | POA: Diagnosis not present

## 2022-08-31 MED ORDER — FELODIPINE ER 10 MG PO TB24: 10.0000 mg | ORAL_TABLET | Freq: Every day | ORAL | 1 refills | Status: DC

## 2022-08-31 MED ORDER — VALSARTAN 160 MG PO TABS: 160.0000 mg | ORAL_TABLET | Freq: Every day | ORAL | 1 refills | Status: DC

## 2022-08-31 NOTE — Progress Notes (Signed)
Virtual Visit via Video Note  I connected with Marissa Hall  on 08/31/22 at  3:20 PM EDT by a video enabled telemedicine application and verified that I am speaking with the correct person using two identifiers.  Location patient: [place] Location provider:work or home office Persons participating in the virtual visit: patient, provider  I discussed the limitations and requested verbal permission for telemedicine visit. The patient expressed understanding and agreed to proceed.  CC: Patient is a 82 y.o. female who presents for 28-month follow-up uncontrolled hypertension as well as anxiety disorder in the setting of alcohol abuse. A/P as of last visit: "#1 GAD, recurrent major depressive disorder. Discussed options today.  Decided to continue with duloxetine 60 mg a day and add clonidine 0.1 mg 3 times daily for anxiety/focus/impulsivity and hopefully for some added blood pressure control. Therapeutic expectations and side effect profile of medication discussed today.  Patient's questions answered. Cautioned her about excessive use of alcohol.  Will avoid benzos.  She does have trazodone to use for insomnia but she does not find this all that helpful. She has been on buspirone in the past but does not recall that it helped very much.  However, she is willing to try this again if needed.   2.  Hypertension, uncontrolled. As per #1 above, adding clonidine 0.1 mg 3 times daily. In future could go up on valsartan dosing. Encouraged her to monitor blood pressure and heart rate daily."  INTERIM HX: She forgot to pick up the felodipine that I prescribed last time. She ran out of valsartan 1 week ago.  There was some confusion and she did not know that we had sent in a short-term supply. Overall she feels cold all the time and tired--not much usual from her usual.  She did have a headache this morning. This week her blood pressure is have been higher than usual due to being out of valsartan. No visual  concerns, no chest pain, no shortness of breath, no focal weakness, no lower extremity swelling.   ROS: See pertinent positives and negatives per HPI.  Past Medical History:  Diagnosis Date   Acute DVT (deep venous thrombosis) ([place]) 04/23/2019   Right popliteal   Alcoholism ([place])    Anxiety and depression 1964   oncologist started duloxetine 12/2016   Breast cancer ([place]) 03/14/2016   Clinical stage 2A: (triple neg): Right breast, upper inner quadrant, 03/2016.  Neoadjuvant chemo x 5 cycles,lumpectomy 4 mo later, then RT started 10/2016.  Adjuvant Xeloda [id number].  SWOG research trial pt 04/2017--pt randomized to pembrolizumab immunotherapy.  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  A1c Mar 12, 2018 was 7.0% at Dr. Shirlyn Goltz.    Epidermoid cyst of vulva    Chronic epidermoid cyst of the vulva.  Excision done 02/2019   GERD (gastroesophageal reflux disease) 2013   History of basal cell cancer    L ankle   Hyperlipidemia 1986   Hypertension 2008   2022 ->addition of hctz to lisinopril led to 22ml drop in GFR. HCTZ d/cd.   Hypothyroidism 1988   Diagnosed in her 42s.  Managed by Endocrinologist  Lumbar radiculopathy 04/2019   Dr. Tamala Julian to get plain films of LB and hip (considering MRI due to her hx of cancer)   Nonischemic cardiomyopathy ([place])    Hx of takotsubo CM   Osteoporosis 2015   pt states "osteopenia", but then says that she refused to take the rx med for this condition, so I suspect she had osteoporosis.   Peripheral neuropathy 2017   Patient states diabetic neuropathy in feet prior to starting chemotherapy and  then worsened by chemo.    SCC (squamous cell carcinoma), arm, right    Syncope and collapse    05/2021 while in [place].  No prodrome.  Monitor ordered and cardiology referral ordered 05/14/21   TIA (transient ischemic attack) 03/26/2011   2012: question of (HA + R eye "floaters"). CT in ED neg acute. Not admitted, no f/u testing done.  ?ocular migraine?    Past Surgical History:  Procedure Laterality Date   ABDOMINAL HYSTERECTOMY  1972   APPENDECTOMY  1972   BREAST ENHANCEMENT SURGERY  1982   BREAST IMPLANT REMOVAL Right 09/13/2016   Procedure: REMOVAL RIGHT BREAST IMPLANT;  Surgeon: Irene Limbo, MD;  Location: [facility];  Service: Plastics;  Laterality: Right;   BREAST LUMPECTOMY WITH RADIOACTIVE SEED AND SENTINEL LYMPH NODE BIOPSY Right 09/13/2016   Procedure: RIGHT BREAST LUMPECTOMY WITH RADIOACTIVE SEED X 2 AND SENTINEL LYMPH NODE BIOPSY;  Surgeon: Alphonsa Overall, MD;  Location: [facility];  Service: General;  Laterality: Right;   BREAST SURGERY Right 03/14/2016   Biopsy   CAPSULECTOMY Right 09/13/2016   Procedure: RIGHT CAPSULECTOMY;  Surgeon: Irene Limbo, MD;  Location: [facility];  Service: Plastics;  Laterality: Right;   CATARACT EXTRACTION, BILATERAL Bilateral 08/10/17 right eye, 08/31/17 left eye   MASS EXCISION Left 02/28/2018   Path: benign.  Procedure: EXCISIONLEFT MEDIAL THIGH MASS ERAS PATHWAY;  Surgeon: Erroll Luna, MD;  Location: [facility];  Service: General;  Laterality: Left;   PORTACATH PLACEMENT N/A 03/15/2016   Procedure: INSERTION PORT-A-CATH WITH [place];  Surgeon: Alphonsa Overall, MD;  Location: [facility];  Service: General;  Laterality: N/A;   PORTACATH REMOVAL  07/2017   SLEEP STUDY  09/2021   NO SLEEP APNEA   surgical repair left ankle Left 2009   s/p [place]   Age 40   TOTAL HIP ARTHROPLASTY Left 10/06/2021   Procedure: TOTAL HIP ARTHROPLASTY ANTERIOR  APPROACH;  Surgeon: [name], MD;  Location: [facility];  Service: Orthopedics;  Laterality: Left;   US CAROTID DOPPLER BILATERAL ([place] [facility])  01/2021   <50% bilat int carotid sten, otherwise normal.     Current Outpatient Medications:    atorvastatin (LIPITOR) 80 MG tablet, Take 1 tablet (80 mg total) by mouth every evening., Disp: 90 tablet, Rfl: 3   carvedilol (COREG) 3.125 MG tablet, Take 1 tablet (3.125 mg total) by mouth 2 (two) times daily with a meal., Disp: 60 tablet, Rfl: 0   cholecalciferol (VITAMIN D3) 25 MCG (1000 UNIT) tablet, Take 1,000 Units by mouth daily., Disp: , Rfl:    cloNIDine (CATAPRES) 0.1 MG tablet, 1 tab po qhs, Disp: 90 tablet, Rfl: 1   DULoxetine (CYMBALTA) 60 MG capsule, Take 1 capsule (60 mg total) by mouth daily., Disp: 90 capsule, Rfl: 1   folic acid (FOLVITE) 1 MG tablet, Take 1 tablet (1 mg total) by mouth daily., Disp: , Rfl:    metFORMIN (GLUCOPHAGE-XR) 500  MG 24 hr tablet, Take 2 tablets (1,000 mg total) by mouth daily before supper., Disp: 180 tablet, Rfl: 3   Multiple Vitamin (MULTIVITAMIN WITH MINERALS) TABS tablet, Take 1 tablet by mouth daily., Disp: , Rfl:    Propylene Glycol (SYSTANE BALANCE OP), Place 1 drop into both eyes daily as needed (for dry eyes)., Disp: , Rfl:    SYNTHROID 112 MCG tablet, Take 1 tablet (112 mcg total) by mouth daily before breakfast., Disp: 90 tablet, Rfl: 2   thiamine 100 MG tablet, Take 1 tablet (100 mg total) by mouth daily., Disp: , Rfl:    valsartan (DIOVAN) 160 MG tablet, Take 1 tablet (160 mg total) by mouth daily., Disp: 7 tablet, Rfl: 0   felodipine (PLENDIL) 10 MG 24 hr tablet, Take 1 tablet (10 mg total) by mouth daily. (Patient not taking: Reported on 08/31/2022), Disp: 30 tablet, Rfl: 0   traZODone (DESYREL) 50 MG tablet, Take 1 tablet (50 mg total) by mouth at bedtime. (Patient not taking: Reported on 08/31/2022), Disp: 90 tablet, Rfl: 1  EXAM:  VITALS per patient if applicable:     [id number]    3:28 PM  08/31/2022    1:16 PM 07/15/2022   10:02 AM  Vitals with BMI  Systolic [id number]  Diastolic 89 89 87  Pulse   57    GENERAL: alert, oriented, appears well and in no acute distress  HEENT: atraumatic, conjunttiva clear, no obvious abnormalities on inspection of external nose and ears  NECK: normal movements of the head and neck  LUNGS: on inspection no signs of respiratory distress, breathing rate appears normal, no obvious gross SOB, gasping or wheezing  CV: no obvious cyanosis  MS: moves all visible extremities without noticeable abnormality  PSYCH/NEURO: pleasant and cooperative, no obvious depression or anxiety, speech and thought processing grossly intact  ASSESSMENT AND PLAN:  Discussed the following assessment and plan:  Uncontrolled hypertension Needs to get back on valsartan 160 mg a day--new prescription sent today for 90-day supply and 1 additional refill. Also needs to start felodipine as planned at last visit, new prescription sent. Continue clonidine 0.1 mg nightly and carvedilol 3.125 twice daily.    I discussed the assessment and treatment plan with the patient. The patient was provided an opportunity to ask questions and all were answered. The patient agreed with the plan and demonstrated an understanding of the instructions.   F/u: 6 weeks follow-up hypertension  Signed:  Crissie Sickles, MD           08/31/2022

## 2022-09-05 ENCOUNTER — Encounter: Payer: Self-pay | Admitting: Family Medicine

## 2022-09-05 MED ORDER — HYDROCHLOROTHIAZIDE 25 MG PO TABS: 25.0000 mg | ORAL_TABLET | Freq: Every day | ORAL | 0 refills | Status: DC

## 2022-09-05 NOTE — Telephone Encounter (Signed)
Take felodipine, valsartan, and the first dose of carvedilol when you wake up in the morning. Take second dose of carvedilol about 5 in the afternoon. I am sending in HCTZ 25 mg to start right away--take every morning. Needs basic metabolic panel in 4 days, diagnosis uncontrolled hypertension.

## 2022-09-05 NOTE — Telephone Encounter (Signed)
Message sent to provider 

## 2022-09-06 ENCOUNTER — Other Ambulatory Visit: Payer: Self-pay

## 2022-09-06 DIAGNOSIS — I10 Essential (primary) hypertension: Secondary | ICD-10-CM

## 2022-09-07 ENCOUNTER — Ambulatory Visit (INDEPENDENT_AMBULATORY_CARE_PROVIDER_SITE_OTHER): Payer: Medicare Other

## 2022-09-07 ENCOUNTER — Ambulatory Visit
Admission: EM | Admit: 2022-09-07 | Discharge: 2022-09-07 | Disposition: A | Discharge status: Home / Self Care | Payer: Medicare Other | Attending: Urgent Care | Admitting: Urgent Care

## 2022-09-07 VITALS — Wt 155.0 lb

## 2022-09-07 DIAGNOSIS — Z0000 Encounter for general adult medical examination without abnormal findings: Secondary | ICD-10-CM

## 2022-09-07 DIAGNOSIS — S51811A Laceration without foreign body of right forearm, initial encounter: Secondary | ICD-10-CM

## 2022-09-07 MED ORDER — CEPHALEXIN 500 MG PO CAPS: 500.0000 mg | ORAL_CAPSULE | Freq: Three times a day (TID) | ORAL | 0 refills | Status: DC

## 2022-09-07 NOTE — Patient Instructions (Signed)
Marissa Hall , Thank you for taking time to come for your Medicare Wellness Visit. I appreciate your ongoing commitment to your health goals. Please review the following plan we discussed and let me know if I can assist you in the future.   These are the goals we discussed:  Goals      Patient Stated     Get blood pressure under control         This is a list of the screening recommended for you and due dates:  Health Maintenance  Topic Date Due   Pneumonia Vaccine (1 of 2 - PCV) Never done   Hepatitis C Screening: USPSTF Recommendation to screen - Ages 61-79 yo.  Never done   Zoster (Shingles) Vaccine (1 of 2) Never done   COVID-19 Vaccine (3 - Pfizer risk series) 09/10/2019   Yearly kidney health urinalysis for diabetes  02/23/2022   Complete foot exam   07/20/2022   Eye exam for diabetics  09/28/2022   Hemoglobin A1C  11/17/2022   Flu Shot  01/05/2023   Yearly kidney function blood test for diabetes  05/03/2023   Mammogram  06/18/2023   Medicare Annual Wellness Visit  09/07/2023   DTaP/Tdap/Td vaccine (2 - Td or Tdap) 10/06/2031   DEXA scan (bone density measurement)  Completed   HPV Vaccine  Aged Out    Advanced directives: Please bring a copy of your health care power of attorney and living will to the office at your convenience.  Conditions/risks identified: get blood pressure and blood sugar under control  Next appointment: Follow up in one year for your annual wellness visit    Preventive Care 65 Years and Older, Female Preventive care refers to lifestyle choices and visits with your health care provider that can promote health and wellness. What does preventive care include? A yearly physical exam. This is also called an annual well check. Dental exams once or twice a year. Routine eye exams. Ask your health care provider how often you should have your eyes checked. Personal lifestyle choices, including: Daily care of your teeth and gums. Regular physical  activity. Eating a healthy diet. Avoiding tobacco and drug use. Limiting alcohol use. Practicing safe sex. Taking low-dose aspirin every day. Taking vitamin and mineral supplements as recommended by your health care provider. What happens during an annual well check? The services and screenings done by your health care provider during your annual well check will depend on your age, overall health, lifestyle risk factors, and family history of disease. Counseling  Your health care provider may ask you questions about your: Alcohol use. Tobacco use. Drug use. Emotional well-being. Home and relationship well-being. Sexual activity. Eating habits. History of falls. Memory and ability to understand (cognition). Work and work Statistician. Reproductive health. Screening  You may have the following tests or measurements: Height, weight, and BMI. Blood pressure. Lipid and cholesterol levels. These may be checked every 5 years, or more frequently if you are over 20 years old. Skin check. Lung cancer screening. You may have this screening every year starting at age 16 if you have a 30-pack-year history of smoking and currently smoke or have quit within the past 15 years. Fecal occult blood test (FOBT) of the stool. You may have this test every year starting at age 74. Flexible sigmoidoscopy or colonoscopy. You may have a sigmoidoscopy every 5 years or a colonoscopy every 10 years starting at age 65. Hepatitis C blood test. Hepatitis B blood test. Sexually transmitted  disease (STD) testing. Diabetes screening. This is done by checking your blood sugar (glucose) after you have not eaten for a while (fasting). You may have this done every 1-3 years. Bone density scan. This is done to screen for osteoporosis. You may have this done starting at age 4. Mammogram. This may be done every 1-2 years. Talk to your health care provider about how often you should have regular mammograms. Talk with your  health care provider about your test results, treatment options, and if necessary, the need for more tests. Vaccines  Your health care provider may recommend certain vaccines, such as: Influenza vaccine. This is recommended every year. Tetanus, diphtheria, and acellular pertussis (Tdap, Td) vaccine. You may need a Td booster every 10 years. Zoster vaccine. You may need this after age 26. Pneumococcal 13-valent conjugate (PCV13) vaccine. One dose is recommended after age 48. Pneumococcal polysaccharide (PPSV23) vaccine. One dose is recommended after age 56. Talk to your health care provider about which screenings and vaccines you need and how often you need them. This information is not intended to replace advice given to you by your health care provider. Make sure you discuss any questions you have with your health care provider. Document Released: 06/19/2015 Document Revised: 02/10/2016 Document Reviewed: 03/24/2015 Elsevier Interactive Patient Education  2017 [employer] Prevention in the Home Falls can cause injuries. They can happen to people of all ages. There are many things you can do to make your home safe and to help prevent falls. What can I do on the outside of my home? Regularly fix the edges of walkways and driveways and fix any cracks. Remove anything that might make you trip as you walk through a door, such as a raised step or threshold. Trim any bushes or trees on the path to your home. Use bright outdoor lighting. Clear any walking paths of anything that might make someone trip, such as rocks or tools. Regularly check to see if handrails are loose or broken. Make sure that both sides of any steps have handrails. Any raised decks and porches should have guardrails on the edges. Have any leaves, snow, or ice cleared regularly. Use sand or salt on walking paths during winter. Clean up any spills in your garage right away. This includes oil or grease spills. What can I  do in the bathroom? Use night lights. Install grab bars by the toilet and in the tub and shower. Do not use towel bars as grab bars. Use non-skid mats or decals in the tub or shower. If you need to sit down in the shower, use a plastic, non-slip stool. Keep the floor dry. Clean up any water that spills on the floor as soon as it happens. Remove soap buildup in the tub or shower regularly. Attach bath mats securely with double-sided non-slip rug tape. Do not have throw rugs and other things on the floor that can make you trip. What can I do in the bedroom? Use night lights. Make sure that you have a light by your bed that is easy to reach. Do not use any sheets or blankets that are too big for your bed. They should not hang down onto the floor. Have a firm chair that has side arms. You can use this for support while you get dressed. Do not have throw rugs and other things on the floor that can make you trip. What can I do in the kitchen? Clean up any spills right away. Avoid walking  on wet floors. Keep items that you use a lot in easy-to-reach places. If you need to reach something above you, use a strong step stool that has a grab bar. Keep electrical cords out of the way. Do not use floor polish or wax that makes floors slippery. If you must use wax, use non-skid floor wax. Do not have throw rugs and other things on the floor that can make you trip. What can I do with my stairs? Do not leave any items on the stairs. Make sure that there are handrails on both sides of the stairs and use them. Fix handrails that are broken or loose. Make sure that handrails are as long as the stairways. Check any carpeting to make sure that it is firmly attached to the stairs. Fix any carpet that is loose or worn. Avoid having throw rugs at the top or bottom of the stairs. If you do have throw rugs, attach them to the floor with carpet tape. Make sure that you have a light switch at the top of the stairs  and the bottom of the stairs. If you do not have them, ask someone to add them for you. What else can I do to help prevent falls? Wear shoes that: Do not have high heels. Have rubber bottoms. Are comfortable and fit you well. Are closed at the toe. Do not wear sandals. If you use a stepladder: Make sure that it is fully opened. Do not climb a closed stepladder. Make sure that both sides of the stepladder are locked into place. Ask someone to hold it for you, if possible. Clearly mark and make sure that you can see: Any grab bars or handrails. First and last steps. Where the edge of each step is. Use tools that help you move around (mobility aids) if they are needed. These include: Canes. Walkers. Scooters. Crutches. Turn on the lights when you go into a dark area. Replace any light bulbs as soon as they burn out. Set up your furniture so you have a clear path. Avoid moving your furniture around. If any of your floors are uneven, fix them. If there are any pets around you, be aware of where they are. Review your medicines with your doctor. Some medicines can make you feel dizzy. This can increase your chance of falling. Ask your doctor what other things that you can do to help prevent falls. This information is not intended to replace advice given to you by your health care provider. Make sure you discuss any questions you have with your health care provider. Document Released: 03/19/2009 Document Revised: 10/29/2015 Document Reviewed: 06/27/2014 Elsevier Interactive Patient Education  2017 [employer].

## 2022-09-07 NOTE — Progress Notes (Signed)
I connected with  Katherine Roan on 09/07/22 by a audio enabled telemedicine application and verified that I am speaking with the correct person using two identifiers.  Patient Location: Home  Provider Location: Home Office   I discussed the limitations of evaluation and management by telemedicine. The patient expressed understanding and agreed to proceed.   Subjective:   Marissa Hall is a 82 y.o. female who presents for Medicare Annual (Subsequent) preventive examination.  Review of Systems     Cardiac Risk Factors include: advanced age (>66men, >76 women);dyslipidemia;hypertension;diabetes mellitus     Objective:    Today's Vitals   09/07/22 1032  Weight: 155 lb (70.3 kg)   Body mass index is 23.57 kg/m.     09/07/2022   10:39 AM 05/10/2022   11:18 AM 05/02/2022   11:40 AM 10/05/2021    9:38 PM 09/01/2021    8:39 AM 02/02/2021    2:03 PM 11/14/2019    8:03 PM  Advanced Directives  Does Patient Have a Medical Advance Directive? Yes Yes Yes No Yes Yes No  Type of Paramedic of [place];Living will [place];Living will [place];Living will  Healthcare Power of [place];Living will   Does patient want to make changes to medical advance directive?      No - Patient declined   Copy of [place] in Chart? No - copy requested No - copy requested   No - copy requested No - copy requested   Would patient like information on creating a medical advance directive?    No - Patient declined   No - Patient declined    Current Medications (verified) Outpatient Encounter Medications as of 09/07/2022  Medication Sig   atorvastatin (LIPITOR) 80 MG tablet Take 1 tablet (80 mg total) by mouth every evening.   carvedilol (COREG) 3.125 MG tablet Take 1 tablet (3.125 mg total) by mouth 2 (two) times daily with a meal.   cholecalciferol (VITAMIN D3) 25 MCG (1000 UNIT) tablet Take 1,000  Units by mouth daily.   cloNIDine (CATAPRES) 0.1 MG tablet 1 tab po qhs   DULoxetine (CYMBALTA) 60 MG capsule Take 1 capsule (60 mg total) by mouth daily.   felodipine (PLENDIL) 10 MG 24 hr tablet Take 1 tablet (10 mg total) by mouth daily.   folic acid (FOLVITE) 1 MG tablet Take 1 tablet (1 mg total) by mouth daily.   metFORMIN (GLUCOPHAGE-XR) 500 MG 24 hr tablet Take 2 tablets (1,000 mg total) by mouth daily before supper.   Multiple Vitamin (MULTIVITAMIN WITH MINERALS) TABS tablet Take 1 tablet by mouth daily.   Propylene Glycol (SYSTANE BALANCE OP) Place 1 drop into both eyes daily as needed (for dry eyes).   SYNTHROID 112 MCG tablet Take 1 tablet (112 mcg total) by mouth daily before breakfast.   thiamine 100 MG tablet Take 1 tablet (100 mg total) by mouth daily.   traZODone (DESYREL) 50 MG tablet Take 1 tablet (50 mg total) by mouth at bedtime.   valsartan (DIOVAN) 160 MG tablet Take 1 tablet (160 mg total) by mouth daily.   hydrochlorothiazide (HYDRODIURIL) 25 MG tablet Take 1 tablet (25 mg total) by mouth daily. (Patient not taking: Reported on 09/07/2022)   No facility-administered encounter medications on file as of 09/07/2022.    Allergies (verified) Other, Prednisone, Amlodipine, Hydrochlorothiazide, and Penicillins   History: Past Medical History:  Diagnosis Date   Acute DVT (deep venous thrombosis) 04/23/2019  Right popliteal   Alcoholism    Anxiety and depression 1964   oncologist started duloxetine 12/2016   Breast cancer 03/14/2016   Clinical stage 2A: (triple neg): Right breast, upper inner quadrant, 03/2016.  Neoadjuvant chemo x 5 cycles,lumpectomy 4 mo later, then RT started 10/2016.  Adjuvant Xeloda [id number].  SWOG research trial pt 04/2017--pt randomized to pembrolizumab immunotherapy.  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  A1c Mar 12, 2018 was 7.0% at Dr. Shirlyn Goltz.    Epidermoid cyst of vulva    Chronic epidermoid cyst of the vulva.  Excision done 02/2019   GERD (gastroesophageal reflux disease) 2013   History of basal cell cancer    L ankle   Hyperlipidemia 1986   Hypertension 2008   2022 ->addition of hctz to lisinopril led to 7ml drop in GFR. HCTZ d/cd.   Hypothyroidism 1988   Diagnosed in her 30s.  Managed by Endocrinologist   Lumbar radiculopathy 04/2019   Dr. Tamala Julian to get plain films of LB and hip (considering MRI due to her hx of cancer)   Nonischemic cardiomyopathy    Hx of takotsubo CM   Osteoporosis 2015   pt states "osteopenia", but then says that she refused to take the rx med for this condition, so I suspect she had osteoporosis.   Peripheral neuropathy 2017   Patient states diabetic neuropathy in feet prior to starting chemotherapy and then worsened by chemo.    SCC (squamous cell carcinoma), arm, right    Syncope and collapse    05/2021 while in [place].  No prodrome.  Monitor ordered and cardiology referral ordered 05/14/21   TIA (transient ischemic attack) 03/26/2011   2012: question of (HA + R eye "floaters"). CT in ED neg acute. Not admitted, no f/u testing done.  ?ocular migraine?   Past Surgical History:  Procedure Laterality Date   ABDOMINAL HYSTERECTOMY  1972   APPENDECTOMY  1972   BREAST ENHANCEMENT SURGERY  1982   BREAST IMPLANT REMOVAL Right 09/13/2016   Procedure: REMOVAL RIGHT BREAST IMPLANT;  Surgeon: Irene Limbo, MD;  Location: [facility];  Service: Plastics;  Laterality: Right;   BREAST LUMPECTOMY WITH RADIOACTIVE SEED AND SENTINEL LYMPH NODE BIOPSY Right 09/13/2016   Procedure: RIGHT BREAST LUMPECTOMY WITH RADIOACTIVE SEED X 2 AND  SENTINEL LYMPH NODE BIOPSY;  Surgeon: Alphonsa Overall, MD;  Location: [facility];  Service: General;  Laterality: Right;   BREAST SURGERY Right 03/14/2016   Biopsy   CAPSULECTOMY Right 09/13/2016   Procedure: RIGHT CAPSULECTOMY;  Surgeon: Irene Limbo, MD;  Location: [facility];  Service: Plastics;  Laterality: Right;   CATARACT EXTRACTION, BILATERAL Bilateral 08/10/17 right eye, 08/31/17 left eye   MASS EXCISION Left 02/28/2018   Path: benign.  Procedure: EXCISIONLEFT MEDIAL THIGH MASS ERAS PATHWAY;  Surgeon: Erroll Luna, MD;  Location: [facility];  Service: General;  Laterality: Left;   PORTACATH PLACEMENT N/A 03/15/2016   Procedure: INSERTION PORT-A-CATH WITH [place];  Surgeon: Alphonsa Overall, MD;  Location: [facility];  Service: General;  Laterality: N/A;   PORTACATH REMOVAL  07/2017   SLEEP STUDY  09/2021   NO SLEEP APNEA   surgical repair left ankle Left 2009   s/p Fall    TONSILLECTOMY AND ADENOIDECTOMY  1948   Age 24   TOTAL HIP ARTHROPLASTY Left 10/06/2021   Procedure: TOTAL HIP ARTHROPLASTY ANTERIOR APPROACH;  Surgeon: [name], MD;  Location: [facility];  Service: Orthopedics;  Laterality: Left;   US CAROTID DOPPLER BILATERAL ([place] HX)  01/2021   <50% bilat int carotid sten, otherwise normal.   Family History  Problem Relation Age of Onset   Stroke Mother    Suicidality Father    Stroke Brother    Stroke Son    Sleep apnea Son    Social History   Socioeconomic History   Marital status: Divorced    Spouse name: Not on file   Number of children: Not on file   Years of education: Not on file   Highest education level: Associate degree: academic program  Occupational History   Not on file  Tobacco Use   Smoking status: Former    Packs/day: 1    Types: Cigarettes    Quit date: 03/08/1999    Years since quitting: 23.5   Smokeless tobacco: Never  Vaping Use   Vaping Use: Never used  Substance and Sexual Activity   Alcohol use: Yes     Alcohol/week: 7.0 standard drinks of alcohol    Types: 7 Shots of liquor per week    Comment:  daily   Drug use: No   Sexual activity: Not Currently    Partners: Male    Birth control/protection: Surgical    Comment: 1st intercourse- 19, partners- 10+, current partner- 8 yrs, hysterectomy  Other Topics Concern   Not on file  Social History Narrative   Divorced, has a son who lives in [place].   Educ: college   Occup: Optometrist, still working part time.   Tob: quit 2000.     Alc: several times a week--1-2 glasses wine.   No drugs.   Social Determinants of Health   Financial Resource Strain: Low Risk  (09/07/2022)   Overall Financial Resource Strain (CARDIA)    Difficulty of Paying Living Expenses: Not hard at all  Food Insecurity: No Food Insecurity (09/07/2022)   Hunger Vital Sign    Worried About Running Out of Food in the Last Year: Never true    Ran Out of Food in the Last Year: Never true  Transportation Needs: No Transportation Needs (09/07/2022)   PRAPARE - Hydrologist (Medical): No    Lack of Transportation (Non-Medical): No  Physical Activity: Inactive (09/07/2022)   Exercise Vital Sign    Days of Exercise per Week: 0 days    Minutes of Exercise per Session: 0 min  Stress: Stress Concern Present (09/07/2022)   [place]    Feeling of Stress : To some extent  Social Connections: Moderately Integrated (09/07/2022)   Social Connection and Isolation Panel [NHANES]    Frequency of Communication with Friends and Family: More than three times a week    Frequency of Social Gatherings with Friends and Family: More than three times a week    Attends Religious Services: More than 4 times per year    Active Member of [employer] or Organizations: Yes    Attends Archivist Meetings: 1 to 4 times per year    Marital Status: Divorced    Tobacco Counseling Counseling given: Not  Answered  Clinical Intake:  Pre-visit preparation completed: Yes  Pain : No/denies pain     BMI - recorded: 23.57 Nutritional Status: BMI of 19-24  Normal Nutritional Risks: None Diabetes: Yes CBG done?: No Did pt. bring in CBG monitor from home?: No  How often do you need to have someone help you when you read instructions, pamphlets, or other written materials from your doctor or pharmacy?: 1 - Never  Diabetic?Nutrition Risk Assessment:  Has the patient had any N/V/D within the last 2 months?  No  Does the patient have any non-healing wounds?  No  Has the patient had any unintentional weight loss or weight gain?  No   Diabetes:  Is the patient diabetic?  Yes  If diabetic, was a CBG obtained today?  No  Did the patient bring in their glucometer from home?  No  How often do you monitor your CBG's? N/a.   Financial Strains and Diabetes Management:  Are you having any financial strains with the device, your supplies or your medication? No .  Does the patient want to be seen by Chronic Care Management for management of their diabetes?  No  Would the patient like to be referred to a Nutritionist or for Diabetic Management?  No   Diabetic Exams:  Diabetic Eye Exam: Completed 09/27/21 Diabetic Foot Exam: Overdue, Pt has been advised about the importance in completing this exam. Pt is scheduled for diabetic foot exam on next .   Interpreter Needed?: No  Information entered by :: Charlott Rakes, LPN   Activities of Daily Living    09/07/2022   10:40 AM 10/10/2021    4:00 PM  In your present state of health, do you have any difficulty performing the following activities:  Hearing? 0   Vision? 0   Difficulty concentrating or making decisions? 0   Walking or climbing stairs? 0   Dressing or bathing? 0   Doing errands, shopping? 0 0  Preparing Food and eating ? N   Using the Toilet? N   In the past six months, have you accidently leaked urine? N   Do you have problems  with loss of bowel control? Y   Comment at times   Managing your Medications? N   Managing your Finances? N   Housekeeping or managing your Housekeeping? N     Patient Care Team: Tammi Sou, MD as PCP - General (Family Medicine) Alphonsa Overall, MD as Consulting Physician (General Surgery) Nicholas Lose, MD as Consulting Physician (Hematology and Oncology) Kyung Rudd, MD as Consulting Physician (Radiation Oncology) Irene Limbo, MD as Consulting Physician (Plastic Surgery) Causey, Charlestine Massed, NP as Nurse Practitioner (Hematology and Oncology) Princess Bruins, MD as Consulting Physician (Obstetrics and Gynecology) Tsamis, Constance Haw, MD as Referring Physician (Ophthalmology) [facility], Melanie Crazier, MD as Consulting Physician (Endocrinology)  Indicate any recent Medical Services you may have received from other than [facility] providers in the past year (date may be approximate).     Assessment:   This is a routine wellness examination for [place].  Hearing/Vision screen Hearing Screening - Comments:: Pt denies any hearing issues  Vision Screening - Comments:: Pt follows up with provider   Dietary issues and exercise activities discussed: Current Exercise Habits: The patient has a physically strenuous job, but has no regular exercise apart from work.   Goals Addressed             This Visit's Progress    Patient Stated       Take  better care of blood pressure and blood sugar        Depression Screen    09/07/2022   10:37 AM 07/08/2022   10:54 AM 09/01/2021    8:38 AM 06/16/2021    4:20 PM 05/07/2021    8:02 AM 12/23/2019    1:41 PM 01/23/2019    3:16 PM  PHQ 2/9 Scores  PHQ - 2 Score 0 2 1 2  0 2 4  PHQ- 9 Score  8  11  6 10     Fall Risk    09/07/2022   10:40 AM 12/24/2021   11:11 AM 10/05/2021   11:45 AM 09/01/2021    8:40 AM 06/16/2021    4:20 PM  Fall Risk   Falls in the past year? 1 1 1 1 1   Number falls in past yr: 1 1 1 1 1   Injury with  Fall? 1 1 1 1 1   Comment hip broken   broke ribs   Risk for fall due to : Impaired balance/gait;Impaired mobility;Impaired vision;History of fall(s)  History of fall(s);Impaired balance/gait;Impaired vision Impaired balance/gait;Impaired vision;History of fall(s) History of fall(s);Impaired vision  Risk for fall due to: Comment    related to syncope   Follow up Falls prevention discussed  Falls evaluation completed Falls prevention discussed Falls evaluation completed    [place]:  Any stairs in or around the home? No  If so, are there any without handrails? No  Home free of loose throw rugs in walkways, pet beds, electrical cords, etc? Yes  Adequate lighting in your home to reduce risk of falls? Yes   ASSISTIVE DEVICES UTILIZED TO PREVENT FALLS:  Life alert? Yes  Use of a cane, walker or w/c? No  Grab bars in the bathroom? Yes  Shower chair or bench in shower? Yes  Elevated toilet seat or a handicapped toilet? No   TIMED UP AND GO:  Was the test performed? No .  Cognitive Function:        09/07/2022   10:41 AM 09/01/2021    8:42 AM  6CIT Screen  What Year? 0 points 0 points  What month? 0 points 0 points  What time? 0 points 0 points  Count back from 20 0 points 0 points  Months in reverse 0 points 0 points  Repeat phrase 0 points 2 points  Total Score 0 points 2 points    Immunizations Immunization History  Administered Date(s) Administered   Fluad Quad(high Dose 65+) 03/13/2019, 05/11/2020, 02/18/2021, 05/06/2022   Influenza, High Dose Seasonal PF 05/03/2017   Influenza,inj,Quad PF,6+ Mos 04/11/2016   PFIZER(Purple Top)SARS-COV-2 Vaccination 07/21/2019, 08/13/2019   Tdap 10/05/2021   Zoster, Live 06/06/2010    TDAP status: Up to date  Flu Vaccine status: Up to date  Pneumococcal vaccine status: Due, Education has been provided regarding the importance of this vaccine. Advised may receive this vaccine at local pharmacy or  Health Dept. Aware to provide a copy of the vaccination record if obtained from local pharmacy or Health Dept. Verbalized acceptance and understanding.  Covid-19 vaccine status: Completed vaccines  Qualifies for Shingles Vaccine? Yes   Zostavax completed No   Shingrix Completed?: No.    Education has been provided regarding the importance of this vaccine. Patient has been advised to call insurance company to determine out of pocket expense if they have not yet received this vaccine. Advised may also receive vaccine at local pharmacy or Health Dept. Verbalized acceptance and understanding.  Screening Tests Health Maintenance  Topic Date Due   Pneumonia Vaccine 30+ Years old (1 of 2 - PCV) Never done   Hepatitis C Screening  Never done   Zoster Vaccines- Shingrix (1 of 2) Never done   COVID-19 Vaccine (3 - Pfizer risk series) 09/10/2019   Diabetic kidney evaluation - Urine ACR  02/23/2022   FOOT EXAM  07/20/2022   OPHTHALMOLOGY EXAM  09/28/2022   HEMOGLOBIN A1C  11/17/2022   INFLUENZA VACCINE  01/05/2023   Diabetic kidney evaluation - eGFR measurement  05/03/2023   MAMMOGRAM  06/18/2023   Medicare Annual Wellness (AWV)  09/07/2023   DTaP/Tdap/Td (2 - Td or Tdap) 10/06/2031   DEXA SCAN  Completed   HPV VACCINES  Aged Out    Health Maintenance  Health Maintenance Due  Topic Date Due   Pneumonia Vaccine 43+ Years old (1 of 2 - PCV) Never done   Hepatitis C Screening  Never done   Zoster Vaccines- Shingrix (1 of 2) Never done   COVID-19 Vaccine (3 - Pfizer risk series) 09/10/2019   Diabetic kidney evaluation - Urine ACR  02/23/2022   FOOT EXAM  07/20/2022    Colorectal cancer screening: No longer required.   Mammogram status: Completed 06/17/22. Repeat every year  Bone Density status: Completed 01/30/19. Results reflect: Bone density results: OSTEOPOROSIS. Repeat every 2 years.   Additional Screening:  Hepatitis C Screening: does qualify;  Vision Screening: Recommended  annual ophthalmology exams for early detection of glaucoma and other disorders of the eye. Is the patient up to date with their annual eye exam?  Yes  Who is the provider or what is the name of the office in which the patient attends annual eye exams? Unsure  of providers name  If pt is not established with a provider, would they like to be referred to a provider to establish care? No .   Dental Screening: Recommended annual dental exams for proper oral hygiene  Community Resource Referral / Chronic Care Management: CRR required this visit?  No   CCM required this visit?  No      Plan:     I have personally reviewed and noted the following in the patient's chart:   Medical and social history Use of alcohol, tobacco or illicit drugs  Current medications and supplements including opioid prescriptions. Patient is not currently taking opioid prescriptions. Functional ability and status Nutritional status Physical activity Advanced directives List of other physicians Hospitalizations, surgeries, and ER visits in previous 12 months Vitals Screenings to include cognitive, depression, and falls Referrals and appointments  In addition, I have reviewed and discussed with patient certain preventive protocols, quality metrics, and best practice recommendations. A written personalized care plan for preventive services as well as general preventive health recommendations were provided to patient.     Willette Brace, LPN   [phone]   Nurse Notes: none

## 2022-09-07 NOTE — ED Provider Notes (Signed)
[facility]  Note:  This document was prepared using Systems analyst and may include unintentional dictation errors.  MRN: [id number] DOB: 26-May-1941  Subjective:   Marissa Hall is a 82 y.o. female presenting for 1 week history of persistent right forearm pain.  Symptoms started from the wound she suffered.  This was a skin tear from scraping her arm against ago with swelling.  Since then she has tended to the wound as carefully as possible.  She is a diabetic and is worried about infection.  Notes redness and pain.  No drainage of pus or bleeding.  Tdap was updated a year ago.  No current facility-administered medications for this encounter.  Current Outpatient Medications:    atorvastatin (LIPITOR) 80 MG tablet, Take 1 tablet (80 mg total) by mouth every evening., Disp: 90 tablet, Rfl: 3   carvedilol (COREG) 3.125 MG tablet, Take 1 tablet (3.125 mg total) by mouth 2 (two) times daily with a meal., Disp: 60 tablet, Rfl: 0   cholecalciferol (VITAMIN D3) 25 MCG (1000 UNIT) tablet, Take 1,000 Units by mouth daily., Disp: , Rfl:    cloNIDine (CATAPRES) 0.1 MG tablet, 1 tab po qhs, Disp: 90 tablet, Rfl: 1   DULoxetine (CYMBALTA) 60 MG capsule, Take 1 capsule (60 mg total) by mouth daily., Disp: 90 capsule, Rfl: 1   felodipine (PLENDIL) 10 MG 24 hr tablet, Take 1 tablet (10 mg total) by mouth daily., Disp: 90 tablet, Rfl: 1   folic acid (FOLVITE) 1 MG tablet, Take 1 tablet (1 mg total) by mouth daily., Disp: , Rfl:    hydrochlorothiazide (HYDRODIURIL) 25 MG tablet, Take 1 tablet (25 mg total) by mouth daily. (Patient not taking: Reported on 09/07/2022), Disp: 15 tablet, Rfl: 0   metFORMIN (GLUCOPHAGE-XR) 500 MG 24 hr tablet, Take 2 tablets (1,000 mg total) by mouth daily before supper., Disp: 180 tablet, Rfl: 3   Multiple Vitamin (MULTIVITAMIN WITH MINERALS) TABS tablet, Take 1 tablet by mouth daily., Disp: , Rfl:    Propylene Glycol (SYSTANE BALANCE  OP), Place 1 drop into both eyes daily as needed (for dry eyes)., Disp: , Rfl:    SYNTHROID 112 MCG tablet, Take 1 tablet (112 mcg total) by mouth daily before breakfast., Disp: 90 tablet, Rfl: 2   thiamine 100 MG tablet, Take 1 tablet (100 mg total) by mouth daily., Disp: , Rfl:    traZODone (DESYREL) 50 MG tablet, Take 1 tablet (50 mg total) by mouth at bedtime., Disp: 90 tablet, Rfl: 1   valsartan (DIOVAN) 160 MG tablet, Take 1 tablet (160 mg total) by mouth daily., Disp: 90 tablet, Rfl: 1   Allergies  Allergen Reactions   Other Other (See Comments)    STEROIDS- emotional   Prednisone Other (See Comments)    Other reaction(s): Mental Status Changes (intolerance)   Amlodipine Other (See Comments)    Elevated creatinine   Hydrochlorothiazide Other (See Comments)    Elevated serum creatinine   Penicillins Other (See Comments)    Unsure of reaction, was 82 years old    Past Medical History:  Diagnosis Date   Acute DVT (deep venous thrombosis) 04/23/2019   Right popliteal   Alcoholism    Anxiety and depression 1964   oncologist started duloxetine 12/2016   Breast cancer 03/14/2016   Clinical stage 2A: (triple neg): Right breast, upper inner quadrant, 03/2016.  Neoadjuvant chemo x 5 cycles,lumpectomy 4 mo later, then RT started 10/2016.  Adjuvant Xeloda [id number].  SWOG research trial pt 04/2017--pt randomized to pembrolizumab immunotherapy.  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  A1c Mar 12, 2018 was 7.0% at Dr. Shirlyn Goltz.    Epidermoid cyst of vulva    Chronic epidermoid cyst of the vulva.  Excision done 02/2019   GERD  (gastroesophageal reflux disease) 2013   History of basal cell cancer    L ankle   Hyperlipidemia 1986   Hypertension 2008   2022 ->addition of hctz to lisinopril led to 54ml drop in GFR. HCTZ d/cd.   Hypothyroidism 1988   Diagnosed in her 3s.  Managed by Endocrinologist   Lumbar radiculopathy 04/2019   Dr. Tamala Julian to get plain films of LB and hip (considering MRI due to her hx of cancer)   Nonischemic cardiomyopathy    Hx of takotsubo CM   Osteoporosis 2015   pt states "osteopenia", but then says that she refused to take the rx med for this condition, so I suspect she had osteoporosis.   Peripheral neuropathy 2017   Patient states diabetic neuropathy in feet prior to starting chemotherapy and then worsened by chemo.    SCC (squamous cell carcinoma), arm, right    Syncope and collapse    05/2021 while in [place].  No prodrome.  Monitor ordered and cardiology referral ordered 05/14/21   TIA (transient ischemic attack) 03/26/2011   2012: question of (HA + R eye "floaters"). CT in ED neg acute. Not admitted, no f/u testing done.  ?ocular migraine?     Past Surgical History:  Procedure Laterality Date   ABDOMINAL HYSTERECTOMY  1972   APPENDECTOMY  1972   BREAST ENHANCEMENT SURGERY  1982   BREAST IMPLANT REMOVAL Right 09/13/2016   Procedure: REMOVAL RIGHT BREAST IMPLANT;  Surgeon: Irene Limbo, MD;  Location: [facility];  Service: Plastics;  Laterality: Right;   BREAST LUMPECTOMY WITH RADIOACTIVE SEED AND SENTINEL LYMPH NODE BIOPSY Right 09/13/2016   Procedure: RIGHT BREAST LUMPECTOMY WITH RADIOACTIVE SEED X 2 AND SENTINEL LYMPH NODE BIOPSY;  Surgeon: Alphonsa Overall, MD;  Location: [facility];  Service: General;  Laterality: Right;   BREAST SURGERY Right 03/14/2016   Biopsy   CAPSULECTOMY Right 09/13/2016   Procedure: RIGHT CAPSULECTOMY;  Surgeon: Irene Limbo, MD;  Location: [facility];  Service: Plastics;  Laterality: Right;    CATARACT EXTRACTION, BILATERAL Bilateral 08/10/17 right eye, 08/31/17 left eye   MASS EXCISION Left 02/28/2018   Path: benign.  Procedure: EXCISIONLEFT MEDIAL THIGH MASS ERAS PATHWAY;  Surgeon: Erroll Luna, MD;  Location: [facility];  Service: General;  Laterality: Left;   PORTACATH PLACEMENT N/A 03/15/2016   Procedure: INSERTION PORT-A-CATH WITH [place];  Surgeon: Alphonsa Overall, MD;  Location: [facility];  Service: General;  Laterality: N/A;   PORTACATH REMOVAL  07/2017   SLEEP STUDY  09/2021   NO SLEEP APNEA   surgical repair left ankle Left 2009   s/p [place]   Age 36   TOTAL HIP ARTHROPLASTY Left 10/06/2021   Procedure: TOTAL HIP ARTHROPLASTY ANTERIOR APPROACH;  Surgeon: [name], MD;  Location: [facility];  Service: Orthopedics;  Laterality: Left;   US CAROTID DOPPLER BILATERAL ([place] HX)  01/2021   <50% bilat int carotid sten, otherwise normal.    Family History  Problem Relation Age of Onset   Stroke Mother    Suicidality Father    Stroke Brother    Stroke Son    Sleep apnea Son     Social History   Tobacco Use   Smoking status: Former    Packs/day: 1    Types: Cigarettes    Quit date: 03/08/1999    Years since quitting: 23.5   Smokeless tobacco: Never  Vaping Use   Vaping Use: Never used  Substance Use Topics   Alcohol use: Yes    Alcohol/week: 7.0 standard drinks of alcohol    Types: 7 Shots of liquor per week    Comment:  daily   Drug use: No    ROS   Objective:   Vitals: BP (!) 167/77 (BP Location: Right Arm)   Pulse 63   Temp 98.2 F (36.8 C) (Oral)   Resp 18   SpO2 98%   Physical Exam Constitutional:      General: She is not in acute distress.    Appearance: Normal appearance. She is well-developed. She is not ill-appearing, toxic-appearing or diaphoretic.  HENT:     Head: Normocephalic and atraumatic.     Nose: Nose normal.     Mouth/Throat:     Mouth: Mucous membranes are moist.  Eyes:      General: No scleral icterus.       Right eye: No discharge.        Left eye: No discharge.     Extraocular Movements: Extraocular movements intact.  Cardiovascular:     Rate and Rhythm: Normal rate.  Pulmonary:     Effort: Pulmonary effort is normal.  Skin:    General: Skin is warm and dry.       Neurological:     General: No focal deficit present.     Mental Status: She is alert and oriented to person, place, and time.  Psychiatric:        Mood and Affect: Mood normal.        Behavior: Behavior normal.     Assessment and Plan :   PDMP not reviewed this encounter.  1. Skin tear of right forearm without complication, initial encounter     Will cover for secondary infection with Keflex.  Creatinine clearance calculated at 51 mL/min.  Discussed general wound care.  Patient has a good eschar, I expect this to resolve without complication. Counseled patient on potential for adverse effects with medications prescribed/recommended today, ER and return-to-clinic precautions discussed, patient verbalized understanding.    Jaynee Eagles, [place] 09/07/22 [id number]

## 2022-09-07 NOTE — Discharge Instructions (Signed)
Keep the wound clean and dry. Do not use ointments or creams. Start cephalexin to address secondary skin infection. Clean the wound daily when you shower. Cover it in really dirty environments.

## 2022-09-07 NOTE — ED Triage Notes (Addendum)
Pt c/o of laceration on right arm, small sore in left wrist happened last Thursday.  Site of injury is slightly irritated, red and sore   Pt states the injury occurred when she was "talking" with goat and scraped her arm on the goats horn.  Pt states had tetanus last May.

## 2022-09-09 ENCOUNTER — Other Ambulatory Visit: Payer: Self-pay

## 2022-09-09 ENCOUNTER — Other Ambulatory Visit: Payer: Self-pay | Admitting: Family Medicine

## 2022-09-09 ENCOUNTER — Telehealth: Payer: Self-pay | Admitting: Family Medicine

## 2022-09-09 ENCOUNTER — Telehealth: Payer: Self-pay | Admitting: Cardiology

## 2022-09-09 MED ORDER — HYDRALAZINE HCL 25 MG PO TABS: 25.0000 mg | ORAL_TABLET | Freq: Three times a day (TID) | ORAL | 0 refills | Status: DC

## 2022-09-09 NOTE — Telephone Encounter (Signed)
Make sure she got the hctz I sent in a few days ago. Also, I'm sending in a prescription for hydralazine for her to start taking right away. Does she have a follow up appt arranged with me already?

## 2022-09-09 NOTE — Telephone Encounter (Signed)
Pt c/o BP issue: STAT if pt c/o blurred vision, one-sided weakness or slurred speech 1. What are your last 5 BP readings? 151/71 11AM, 211/91 9AM, and 215/94 8 AM  2. Are you having any other symptoms (ex. Dizziness, headache, blurred vision, passed out)? Headaches and dizzy   3. What is your BP issue? Ranging around 200/90  hydrochlorothiazide (HYDRODIURIL) 25 MG tablet    valsartan (DIOVAN) 160 MG tablet    carvedilol (COREG) 3.125 MG tablet  traZODone (DESYREL) 50 MG tablet  Pt took these medications but it did not bring her bp down. Pt stated that her BP keeps ranging around 200/90. Pt said they have been having horrible headaches and felt a little dizzy this morning. Please advise

## 2022-09-09 NOTE — Telephone Encounter (Signed)
Called patient and she reported that her PCP had prescribed her medications to help control her blood pressure. Per the patient when she takes her medications her blood pressure will come down, but it is still running high. Since her blood pressure was being managed by her PCP and Dr. Dulce Sellar had not seen her in the office for over a year I recommended that she reach out to her PCP for guidance regarding her blood pressure. Patient was in agreement with this plan and stated that she would reach out to her PCP.

## 2022-09-09 NOTE — Telephone Encounter (Signed)
Pt called an triaged but wanted to know what she needs to do about her blood pressure. States that it has been elevated even with new instructions. States current BP is 171/71 but has been in the 200s this morning. Please advise what pt should do

## 2022-09-09 NOTE — Telephone Encounter (Signed)
Pt advised of med recommendations, she is scheduled for Wednesday.

## 2022-09-14 ENCOUNTER — Ambulatory Visit (INDEPENDENT_AMBULATORY_CARE_PROVIDER_SITE_OTHER): Payer: Medicare Other | Admitting: Family Medicine

## 2022-09-14 DIAGNOSIS — I10 Essential (primary) hypertension: Secondary | ICD-10-CM

## 2022-09-27 ENCOUNTER — Ambulatory Visit (INDEPENDENT_AMBULATORY_CARE_PROVIDER_SITE_OTHER): Payer: Medicare Other | Admitting: Family Medicine

## 2022-09-27 ENCOUNTER — Encounter: Payer: Self-pay | Admitting: Family Medicine

## 2022-09-27 VITALS — BP 136/73 | HR 67 | Temp 98.1°F | Ht 68.0 in | Wt 150.8 lb

## 2022-09-27 DIAGNOSIS — F3342 Major depressive disorder, recurrent, in full remission: Secondary | ICD-10-CM

## 2022-09-27 DIAGNOSIS — I10 Essential (primary) hypertension: Secondary | ICD-10-CM

## 2022-09-27 DIAGNOSIS — R002 Palpitations: Secondary | ICD-10-CM | POA: Diagnosis not present

## 2022-09-27 LAB — BASIC METABOLIC PANEL
BUN: 17 mg/dL (ref 6–23)
CO2: 27 mEq/L (ref 19–32)
Calcium: 9.8 mg/dL (ref 8.4–10.5)
Chloride: 100 mEq/L (ref 96–112)
Creatinine, Ser: 0.84 mg/dL (ref 0.40–1.20)
GFR: 66.09 mL/min (ref >60.00)
Glucose, Bld: 135 mg/dL — ABNORMAL HIGH (ref 70–99)
Potassium: 4.6 mEq/L (ref 3.5–5.1)
Sodium: 138 mEq/L (ref 135–145)

## 2022-09-27 MED ORDER — VALSARTAN 160 MG PO TABS: 160.0000 mg | ORAL_TABLET | Freq: Every day | ORAL | 1 refills | Status: DC

## 2022-09-27 MED ORDER — FELODIPINE ER 10 MG PO TB24: 10.0000 mg | ORAL_TABLET | Freq: Every day | ORAL | 1 refills | Status: DC

## 2022-09-27 MED ORDER — HYDRALAZINE HCL 25 MG PO TABS: 25.0000 mg | ORAL_TABLET | Freq: Three times a day (TID) | ORAL | 1 refills | Status: DC

## 2022-09-27 MED ORDER — CLONIDINE HCL 0.1 MG PO TABS: ORAL_TABLET | ORAL | 1 refills | Status: DC

## 2022-09-27 MED ORDER — CARVEDILOL 3.125 MG PO TABS: 3.1250 mg | ORAL_TABLET | Freq: Two times a day (BID) | ORAL | 0 refills | Status: DC

## 2022-09-27 NOTE — Progress Notes (Signed)
OFFICE VISIT  09/27/2022  CC:  Chief Complaint  Patient presents with   Medical Management of Chronic Issues    Follow up BP; was represcribed HCTZ  but stopped taking 1 week ago. Took bp this morning with her cuff, 166/72 HR 74    Patient is a 82 y.o. female who presents for follow-up hypertension.  INTERIM HX: Marco feels well. Blood pressures in the morning are elevated when she first gets up but after taking her blood pressure medications it goes down to the 130/80 range. She is currently taking carvedilol 3.125 mg twice daily, clonidine 0.1 mg nightly, felodipine 10 mg daily, hydralazine 25 mg 3 times daily, and losartan 160 mg daily. She took the HCTZ that I have most recently prescribed for only a short time due to her fear that it may cause worsening renal function.  She has had a few periods over the last few weeks of palpitations/heart pounding.  She estimates that it lasts a minute or so.  She has to do some slow deep breathing to get it to go away.  Denies chest pain.  Unsure about dizziness.  On 2 of the 3 occasions it woke her up from sleep in the night.  She has some chronic diarrhea that started ever since she has been on metformin.  Past Medical History:  Diagnosis Date   Acute DVT (deep venous thrombosis) 04/23/2019   Right popliteal   Alcoholism    Anxiety and depression 1964   oncologist started duloxetine 12/2016   Breast cancer 03/14/2016   Clinical stage 2A: (triple neg): Right breast, upper inner quadrant, 03/2016.  Neoadjuvant chemo x 5 cycles,lumpectomy 4 mo later, then RT started 10/2016.  Adjuvant Xeloda [id number].  SWOG research trial pt 04/2017--pt randomized to pembrolizumab immunotherapy.  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  A1c Mar 12, 2018 was 7.0% at Dr. Lacie Draft.    Epidermoid cyst of vulva    Chronic epidermoid cyst of the vulva.  Excision done 02/2019   GERD (gastroesophageal reflux disease) 2013   History of basal cell cancer    L ankle   Hyperlipidemia 1986   Hypertension 2008   2022 ->addition of hctz to lisinopril led to 25ml drop in GFR. HCTZ d/cd.   Hypothyroidism 1988   Diagnosed in her 73s.  Managed by Endocrinologist   Lumbar radiculopathy 04/2019   Dr. Katrinka Blazing to get plain films of LB and hip (considering MRI due to her hx of cancer)   Nonischemic cardiomyopathy    Hx of takotsubo CM   Osteoporosis 2015   pt states "osteopenia", but then says that she refused to take the rx med for this condition, so I suspect she had osteoporosis.   Peripheral neuropathy 2017   Patient states diabetic neuropathy in feet prior to starting chemotherapy and then worsened by chemo.    SCC (squamous cell carcinoma), arm, right    Syncope and collapse    05/2021 while in [place].  No prodrome.  Monitor ordered and cardiology referral ordered 05/14/21   TIA (transient ischemic attack) 03/26/2011   2012: question of (HA + R eye "floaters").  CT in ED neg acute. Not admitted, no f/u testing done.  ?ocular migraine?    Past Surgical History:  Procedure Laterality Date   ABDOMINAL HYSTERECTOMY  1972   APPENDECTOMY  1972   BREAST ENHANCEMENT SURGERY  1982   BREAST IMPLANT REMOVAL Right 09/13/2016   Procedure: REMOVAL RIGHT BREAST IMPLANT;  Surgeon: Glenna Fellows, MD;  Location: [facility];  Service: Plastics;  Laterality: Right;   BREAST LUMPECTOMY WITH RADIOACTIVE SEED AND SENTINEL LYMPH NODE BIOPSY Right 09/13/2016   Procedure: RIGHT BREAST LUMPECTOMY WITH RADIOACTIVE SEED X 2 AND SENTINEL LYMPH NODE BIOPSY;  Surgeon: Ovidio Kin, MD;  Location: [facility];  Service: General;  Laterality: Right;   BREAST SURGERY Right 03/14/2016   Biopsy   CAPSULECTOMY Right 09/13/2016   Procedure: RIGHT CAPSULECTOMY;  Surgeon: Glenna Fellows, MD;  Location: [facility];  Service: Plastics;  Laterality: Right;   CATARACT EXTRACTION, BILATERAL Bilateral 08/10/17 right eye, 08/31/17 left eye   MASS EXCISION Left 02/28/2018   Path: benign.  Procedure: EXCISIONLEFT MEDIAL THIGH MASS ERAS PATHWAY;  Surgeon: Harriette Bouillon, MD;  Location: [facility];  Service: General;  Laterality: Left;   PORTACATH PLACEMENT N/A 03/15/2016   Procedure: INSERTION PORT-A-CATH WITH [place];  Surgeon: Ovidio Kin, MD;  [facility];  Service: General;  Laterality: N/A;   PORTACATH REMOVAL  07/2017   SLEEP STUDY  09/2021   NO SLEEP APNEA   surgical repair left ankle Left 2009   s/p [place]    TONSILLECTOMY AND ADENOIDECTOMY  1948   Age 18   TOTAL HIP ARTHROPLASTY Left 10/06/2021   Procedure: TOTAL HIP ARTHROPLASTY ANTERIOR APPROACH;  Surgeon: Eldred Manges, MD;  [facility];  Service: Orthopedics;  Laterality: Left;   US CAROTID DOPPLER BILATERAL ([facility] HX)  01/2021   <50% bilat int carotid sten, otherwise normal.   Zio monitoring     06/2021; sinus, some mild brady, brief SVT, no signif PVCs    Outpatient Medications Prior to Visit  Medication Sig Dispense Refill   atorvastatin (LIPITOR) 80 MG tablet Take 1 tablet (80 mg total) by mouth every evening. 90 tablet 3   cholecalciferol (VITAMIN D3) 25 MCG (1000 UNIT) tablet Take 1,000 Units by mouth daily.     DULoxetine (CYMBALTA) 60 MG capsule Take 1 capsule (60 mg total) by mouth daily. 90 capsule 1   folic acid (FOLVITE) 1 MG tablet Take 1 tablet (1 mg total) by mouth daily.     metFORMIN (GLUCOPHAGE-XR) 500 MG 24 hr tablet Take 2 tablets (1,000 mg total) by mouth daily before supper. 180 tablet 3   Multiple Vitamin (MULTIVITAMIN WITH MINERALS) TABS tablet Take 1 tablet by mouth daily.      Propylene Glycol (SYSTANE BALANCE OP) Place 1 drop into both eyes daily as needed (for dry eyes).     SYNTHROID 112 MCG tablet Take 1 tablet (112 mcg total) by mouth daily before breakfast. 90 tablet 2   thiamine 100 MG tablet Take 1 tablet (100 mg total) by mouth daily.     traZODone (DESYREL) 50 MG tablet Take 1 tablet (50 mg total) by mouth at bedtime. 90 tablet 1   carvedilol (COREG) 3.125 MG tablet Take 1 tablet (3.125 mg total) by mouth 2 (two) times daily with a meal. 60 tablet 0   cephALEXin (KEFLEX) 500 MG capsule Take 1 capsule (500 mg total) by mouth 3 (three) times daily. 21 capsule 0   cloNIDine (CATAPRES)  0.1 MG tablet 1 tab po qhs 90 tablet 1   felodipine (PLENDIL) 10 MG 24 hr tablet Take 1 tablet (10 mg total) by mouth daily. 90 tablet 1   hydrALAZINE (APRESOLINE) 25 MG tablet Take 1 tablet (25 mg total) by mouth 3 (three) times daily. 30 tablet 0   valsartan (DIOVAN) 160 MG tablet Take 1 tablet (160 mg total) by mouth daily. 90 tablet 1   hydrochlorothiazide (HYDRODIURIL) 25 MG tablet Take 1 tablet (25 mg total) by mouth daily. (Patient not taking: Reported on 09/07/2022) 15 tablet 0   No facility-administered medications prior to visit.    Allergies  Allergen Reactions   Other Other (See Comments)    STEROIDS- emotional   Prednisone Other (See Comments)    Other reaction(s): Mental Status Changes (intolerance)   Amlodipine Other (See Comments)    Elevated creatinine   Hydrochlorothiazide Other (See Comments)    Elevated serum creatinine   Penicillins Other (See Comments)    Unsure of reaction, was 82 years old    Review of Systems As per HPI  PE:    09/27/2022   11:07 AM 09/07/2022   10:58 AM 09/07/2022   10:32 AM  Vitals with BMI  Height 5\' 8"     Weight 150 lbs 13 oz  155 lbs  BMI 22.93    Systolic 136 167   Diastolic 73 77   Pulse 67 63      Physical Exam  Gen: Alert, well appearing.  Patient is oriented to person, place, time, and situation. AFFECT:  pleasant, lucid thought and speech. CV: RRR, no m/r/g.   LUNGS: CTA bilat, nonlabored resps, good aeration in all lung fields. EXT: no clubbing or cyanosis.  no edema.    LABS:  Last CBC Lab Results  Component Value Date   WBC 4.2 05/02/2022   HGB 12.9 05/02/2022   HCT 37.7 05/02/2022   MCV 96.2 05/02/2022   MCH 32.9 05/02/2022   RDW 13.0 05/02/2022   PLT 171 05/02/2022   Last metabolic panel Lab Results  Component Value Date   GLUCOSE 275 (H) 05/02/2022   NA 135 05/02/2022   K 5.3 (H) 05/02/2022   CL 99 05/02/2022   CO2 28 05/02/2022   BUN 24 (H) 05/02/2022   CREATININE 1.00 05/02/2022   GFRNONAA 57 (L) 05/02/2022   CALCIUM 10.0 05/02/2022   PROT 7.4 05/02/2022   ALBUMIN 4.8 05/02/2022   BILITOT 1.8 (H) 05/02/2022   ALKPHOS 70 05/02/2022   AST 22 05/02/2022   ALT 29 05/02/2022   ANIONGAP 8 05/02/2022   Last lipids Lab Results  Component Value Date   CHOL 311 (H) 02/23/2021   HDL 64.10 02/23/2021   LDLCALC 92 03/23/2020   LDLDIRECT 218.0 02/23/2021   TRIG 242.0 (H) 02/23/2021   CHOLHDL 5 02/23/2021   Last hemoglobin A1c Lab Results  Component Value Date   HGBA1C 7.0 (A) 05/18/2022   Last thyroid functions Lab Results  Component Value Date   TSH 0.05 (L) 05/18/2022   T4TOTAL 6.6 05/21/2018   IMPRESSION AND PLAN:  #1 hypertension, labile. Currently well-controlled on carvedilol 3.125 mg twice daily, clonidine 0.1 mg nightly, felodipine 10 mg daily, hydralazine 25 mg 3 times daily, and valsartan 160 mg daily. Check bmet today.  2 palpitations. Possible SVT. Rhythm monitoring January 2023 showed a couple of very brief runs of this. If she has another episode in the next week or 2 she will call and we will set up rhythm  monitoring again. Additionally, she is going to call and arrange follow-up with her cardiologist, Dr. Dulce Sellar.  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 She is pretty miserable. She is considering short-term discontinuation of the  medication to see how things go. At this point, though, she plans on discussing things with her endocrinologist first.  #4 recurrent major depressive disorder, generalized anxiety disorder. She is doing pretty well. She is trying to do destress a little bit--is limiting her work week to 2 or 3 days. Continue Cymbalta 60 mg a day. She is enjoying some home standing activities, especially making sourdough bread.  An After Visit Summary was printed and given to the patient.  FOLLOW UP: Return in about 3 months (around 12/27/2022) for routine chronic illness f/u.  Signed:  Santiago Bumpers, MD           09/27/2022

## 2022-10-07 DIAGNOSIS — I10 Essential (primary) hypertension: Secondary | ICD-10-CM | POA: Diagnosis not present

## 2022-10-07 DIAGNOSIS — E1140 Type 2 diabetes mellitus with diabetic neuropathy, unspecified: Secondary | ICD-10-CM | POA: Diagnosis not present

## 2022-10-24 ENCOUNTER — Encounter: Payer: Self-pay | Admitting: Family Medicine

## 2022-10-25 NOTE — Telephone Encounter (Signed)
Hydralazine may be the culprit. Are your blood pressures ok lately? Have you continued to have episodes of palpitations? Sounds like you need to be seen. Unfortunately, I'm out of town this week but you could call the office and get set up with a visit with another provider.  If blood pressures are ok and you feel like you can wait then arrange appt with me next week.

## 2022-11-03 DIAGNOSIS — I10 Essential (primary) hypertension: Secondary | ICD-10-CM | POA: Diagnosis not present

## 2022-11-03 DIAGNOSIS — E1140 Type 2 diabetes mellitus with diabetic neuropathy, unspecified: Secondary | ICD-10-CM | POA: Diagnosis not present

## 2022-11-04 DIAGNOSIS — I10 Essential (primary) hypertension: Secondary | ICD-10-CM | POA: Diagnosis not present

## 2022-11-04 DIAGNOSIS — E1140 Type 2 diabetes mellitus with diabetic neuropathy, unspecified: Secondary | ICD-10-CM | POA: Diagnosis not present

## 2022-11-08 DIAGNOSIS — L03012 Cellulitis of left finger: Secondary | ICD-10-CM | POA: Diagnosis not present

## 2022-11-08 NOTE — Patient Instructions (Addendum)
Thanks for the information about copilot IQ. I look forward to seeing your glucose and blood pressure trends.

## 2022-11-11 ENCOUNTER — Ambulatory Visit (INDEPENDENT_AMBULATORY_CARE_PROVIDER_SITE_OTHER): Payer: Medicare Other | Admitting: Family Medicine

## 2022-11-11 ENCOUNTER — Encounter: Payer: Self-pay | Admitting: Family Medicine

## 2022-11-11 VITALS — BP 124/67 | HR 58 | Ht 68.0 in | Wt 159.2 lb

## 2022-11-11 DIAGNOSIS — H9202 Otalgia, left ear: Secondary | ICD-10-CM | POA: Diagnosis not present

## 2022-11-11 DIAGNOSIS — R0989 Other specified symptoms and signs involving the circulatory and respiratory systems: Secondary | ICD-10-CM

## 2022-11-11 DIAGNOSIS — H6123 Impacted cerumen, bilateral: Secondary | ICD-10-CM | POA: Diagnosis not present

## 2022-11-11 DIAGNOSIS — R002 Palpitations: Secondary | ICD-10-CM | POA: Diagnosis not present

## 2022-11-11 DIAGNOSIS — J069 Acute upper respiratory infection, unspecified: Secondary | ICD-10-CM | POA: Diagnosis not present

## 2022-11-11 MED ORDER — CARVEDILOL 3.125 MG PO TABS: 3.1250 mg | ORAL_TABLET | Freq: Two times a day (BID) | ORAL | 1 refills | Status: DC

## 2022-11-11 NOTE — Progress Notes (Signed)
OFFICE VISIT  11/11/2022  CC:  Chief Complaint  Patient presents with   Medical Management of Chronic Issues    Patient is a 82 y.o. female who presents for 6-week follow-up labile hypertension and palpitations. A/P as of last visit: "1 hypertension, labile. Currently well-controlled on carvedilol 3.125 mg twice daily, clonidine 0.1 mg nightly, felodipine 10 mg daily, hydralazine 25 mg 3 times daily, and valsartan 160 mg daily. Check bmet today.   2 palpitations. Possible SVT. Rhythm monitoring January 2023 showed a couple of very brief runs of this. If she has another episode in the next week or 2 she will call and we will set up rhythm monitoring again. Additionally, she is going to call and arrange follow-up with her cardiologist, Dr. Dulce Sellar.   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 She is pretty miserable. She is considering short-term discontinuation of the medication to see how things go. At this point, though, she plans on discussing things with her endocrinologist first.   #4 recurrent major depressive disorder, generalized anxiety disorder. She is doing pretty well. She is trying to do destress a little bit--is limiting her work week to 2 or 3 days. Continue Cymbalta 60 mg a day. She is enjoying some home standing activities, especially making sourdough bread."  INTERIM HX: Feeling well except some waxing and waning pain in the left ear lately. She has had a URI with cough over the last couple weeks that is appropriately resolving lately.  No fever. She uses a new app called "[employer]" through Medicare.  She monitors her blood pressure and glucoses and these are automatically uploaded and put into graph form and the trends are faxed to her providers every month. Blood pressures ranging anywhere from 130 to 170s systolic, 70s to 16X diastolic. These measurements are before taking her blood pressure medications in the morning.  Her palpitations have essentially resolved.   She feels like she was under excessive stress when these were going on.   ROS as above, plus--> no CP, no SOB, no wheezing, no dizziness, no HAs, no rashes, no melena/hematochezia.  No polyuria or polydipsia.  No myalgias or arthralgias.  No focal weakness, paresthesias, or tremors.  No acute vision or hearing abnormalities.  No dysuria or unusual/new urinary urgency or frequency.  No recent changes in lower legs. No n/v/d or abd pain.    Past Medical History:  Diagnosis Date   Acute DVT (deep venous thrombosis) (HCC) 04/23/2019   Right popliteal   Alcoholism (HCC)    Anxiety and depression 1964   oncologist started duloxetine 12/2016   Breast cancer (HCC) 03/14/2016   Clinical stage 2A: (triple neg): Right breast, upper inner quadrant, 03/2016.  Neoadjuvant chemo x 5 cycles,lumpectomy 4 mo later, then RT started 10/2016.  Adjuvant Xeloda [id number].  SWOG research trial pt 04/2017--pt randomized to pembrolizumab immunotherapy.  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  A1c Mar 12, 2018 was 7.0% at Dr. Lacie Draft.    Epidermoid cyst of vulva    Chronic epidermoid cyst of the vulva.  Excision done 02/2019   GERD (gastroesophageal reflux disease) 2013  History of basal cell cancer    L ankle   Hyperlipidemia 1986   Hypertension 2008   2022 ->addition of hctz to lisinopril led to 25ml drop in GFR. HCTZ d/cd.   Hypothyroidism 1988   Diagnosed in her 14s.  Managed by Endocrinologist   Lumbar radiculopathy 04/2019   Dr. Katrinka Blazing to get plain films of LB and hip (considering MRI due to her hx of cancer)   Nonischemic cardiomyopathy  (HCC)    Hx of takotsubo CM   Osteoporosis 2015   pt states "osteopenia", but then says that she refused to take the rx med for this condition, so I suspect she had osteoporosis.   Peripheral neuropathy 2017   Patient states diabetic neuropathy in feet prior to starting chemotherapy and then worsened by chemo.    SCC (squamous cell carcinoma), arm, right    Syncope and collapse    05/2021 while in [place].  No prodrome.  Monitor ordered and cardiology referral ordered 05/14/21   TIA (transient ischemic attack) 03/26/2011   2012: question of (HA + R eye "floaters"). CT in ED neg acute. Not admitted, no f/u testing done.  ?ocular migraine?    Past Surgical History:  Procedure Laterality Date   ABDOMINAL HYSTERECTOMY  1972   APPENDECTOMY  1972   BREAST ENHANCEMENT SURGERY  1982   BREAST IMPLANT REMOVAL Right 09/13/2016   Procedure: REMOVAL RIGHT BREAST IMPLANT;  Surgeon: Glenna Fellows, MD;  Location: [facility];  Service: Plastics;  Laterality: Right;   BREAST LUMPECTOMY WITH RADIOACTIVE SEED AND SENTINEL LYMPH NODE BIOPSY Right 09/13/2016   Procedure: RIGHT BREAST LUMPECTOMY WITH RADIOACTIVE SEED X 2 AND SENTINEL LYMPH NODE BIOPSY;  Surgeon: Ovidio Kin, MD;  Location: [facility];  Service: General;  Laterality: Right;   BREAST SURGERY Right 03/14/2016   Biopsy   CAPSULECTOMY Right 09/13/2016   Procedure: RIGHT CAPSULECTOMY;  Surgeon: Glenna Fellows, MD;  Location: [facility];  Service: Plastics;  Laterality: Right;   CATARACT EXTRACTION, BILATERAL Bilateral 08/10/17 right eye, 08/31/17 left eye   MASS EXCISION Left 02/28/2018   Path: benign.  Procedure: EXCISIONLEFT MEDIAL THIGH MASS ERAS PATHWAY;  Surgeon: Harriette Bouillon, MD;  Location: [facility];  Service: General;  Laterality: Left;   PORTACATH PLACEMENT N/A 03/15/2016   Procedure: INSERTION PORT-A-CATH WITH [place];  Surgeon: Ovidio Kin, MD;  Location: [facility];  Service:  General;  Laterality: N/A;   PORTACATH REMOVAL  07/2017   SLEEP STUDY  09/2021   NO SLEEP APNEA   surgical repair left ankle Left 2009   s/p Fall    TONSILLECTOMY AND ADENOIDECTOMY  1948   Age 50   TOTAL HIP ARTHROPLASTY Left 10/06/2021   Procedure: TOTAL HIP ARTHROPLASTY ANTERIOR APPROACH;  Surgeon: Eldred Manges, MD;  Location: [facility];  Service: Orthopedics;  Laterality: Left;   US CAROTID DOPPLER BILATERAL ([facility] HX)  01/2021   <50% bilat int carotid sten, otherwise normal.   Zio monitoring     06/2021; sinus, some mild brady, brief SVT, no signif PVCs    Outpatient Medications Prior to Visit  Medication Sig Dispense Refill   atorvastatin (LIPITOR) 80 MG tablet Take 1 tablet (80 mg total) by mouth every evening. 90 tablet 3   cholecalciferol (VITAMIN D3) 25 MCG (1000 UNIT) tablet Take 1,000 Units by mouth daily.     cloNIDine (CATAPRES) 0.1 MG tablet 1 tab po qhs 90 tablet 1   DULoxetine (CYMBALTA) 60 MG capsule  Take 1 capsule (60 mg total) by mouth daily. 90 capsule 1   felodipine (PLENDIL) 10 MG 24 hr tablet Take 1 tablet (10 mg total) by mouth daily. 90 tablet 1   folic acid (FOLVITE) 1 MG tablet Take 1 tablet (1 mg total) by mouth daily.     metFORMIN (GLUCOPHAGE-XR) 500 MG 24 hr tablet Take 2 tablets (1,000 mg total) by mouth daily before supper. 180 tablet 3   Multiple Vitamin (MULTIVITAMIN WITH MINERALS) TABS tablet Take 1 tablet by mouth daily.     Propylene Glycol (SYSTANE BALANCE OP) Place 1 drop into both eyes daily as needed (for dry eyes).     SYNTHROID 112 MCG tablet Take 1 tablet (112 mcg total) by mouth daily before breakfast. 90 tablet 2   thiamine 100 MG tablet Take 1 tablet (100 mg total) by mouth daily.     traZODone (DESYREL) 50 MG tablet Take 1 tablet (50 mg total) by mouth at bedtime. 90 tablet 1   valsartan (DIOVAN) 160 MG tablet Take 1 tablet (160 mg total) by mouth daily. 90 tablet 1   hydrALAZINE (APRESOLINE) 25 MG tablet Take 1 tablet (25 mg total) by mouth  3 (three) times daily. (Patient not taking: Reported on 11/11/2022) 270 tablet 1   carvedilol (COREG) 3.125 MG tablet Take 1 tablet (3.125 mg total) by mouth 2 (two) times daily with a meal. 60 tablet 0   No facility-administered medications prior to visit.    Allergies  Allergen Reactions   Other Other (See Comments)    STEROIDS- emotional   Prednisone Other (See Comments)    Other reaction(s): Mental Status Changes (intolerance)   Amlodipine Other (See Comments)    Elevated creatinine   Hydrochlorothiazide Other (See Comments)    Elevated serum creatinine   Penicillins Other (See Comments)    Unsure of reaction, was 82 years old    Review of Systems As per HPI  PE:    11/11/2022    2:43 PM 09/27/2022   11:07 AM 09/07/2022   10:58 AM  Vitals with BMI  Height 5\' 8"  5\' 8"    Weight 159 lbs 3 oz 150 lbs 13 oz   BMI 24.21 22.93   Systolic 124 136 409  Diastolic 67 73 77  Pulse 58 67 63     Physical Exam  Gen: Alert, well appearing.  Patient is oriented to person, place, time, and situation. AFFECT: pleasant, lucid thought and speech. WJX:BJYN: no injection, icteris, swelling, or exudate.  EOMI, PERRLA. EARS: EACs with complete cerumen impaction bilat. Mouth: lips without lesion/swelling.  Oral mucosa pink and moist. Oropharynx without erythema, exudate, or swelling.  CV: RRR, no m/r/g.   LUNGS: CTA bilat, nonlabored resps, good aeration in all lung fields.   LABS:  Last CBC Lab Results  Component Value Date   WBC 4.2 05/02/2022   HGB 12.9 05/02/2022   HCT 37.7 05/02/2022   MCV 96.2 05/02/2022   MCH 32.9 05/02/2022   RDW 13.0 05/02/2022   PLT 171 05/02/2022   Last metabolic panel Lab Results  Component Value Date   GLUCOSE 135 (H) 09/27/2022   NA 138 09/27/2022   K 4.6 09/27/2022   CL 100 09/27/2022   CO2 27 09/27/2022   BUN 17 09/27/2022   CREATININE 0.84 09/27/2022   GFRNONAA 57 (L) 05/02/2022   CALCIUM 9.8 09/27/2022   PROT 7.4 05/02/2022   ALBUMIN 4.8  05/02/2022   BILITOT 1.8 (H) 05/02/2022   ALKPHOS 70 05/02/2022  AST 22 05/02/2022   ALT 29 05/02/2022   ANIONGAP 8 05/02/2022   Last lipids Lab Results  Component Value Date   CHOL 311 (H) 02/23/2021   HDL 64.10 02/23/2021   LDLCALC 92 03/23/2020   LDLDIRECT 218.0 02/23/2021   TRIG 242.0 (H) 02/23/2021   CHOLHDL 5 02/23/2021   Last hemoglobin A1c Lab Results  Component Value Date   HGBA1C 7.0 (A) 05/18/2022   Last thyroid functions Lab Results  Component Value Date   TSH 0.05 (L) 05/18/2022   T4TOTAL 6.6 05/21/2018   IMPRESSION AND PLAN:  #1 labile hypertension, control is pretty good and stable. I think we will keep her medication regimen the same: Coreg 3.125 mg twice a day chronic clonidine 0.1 mg nightly, felodipine 10 mg daily, and hydralazine 25 mg 3 times a day (she manages to get the hydralazine in once a day usually).  #2 palpitations, history of brief runs of SVT on rhythm monitoring January 2023. These have resolved since I last saw her.  Stress reduction has helped.  #3 left ear pain--she has bilateral cerumen impactions. Consent obtained. Procedure: Cerumen Disimpaction  Warm water was applied and gentle ear lavage performed on both sides. There were no complications and following the disimpaction the tympanic membrane was visible on both sides. Tympanic membranes are intact following the procedure.  Auditory canals are normal.  The patient reported relief of symptoms after removal of cerumen   #4 URI with cough, resolving appropriately. No new treatments recommended.  An After Visit Summary was printed and given to the patient.  FOLLOW UP: Return in about 3 months (around 02/11/2023) for routine chronic illness f/u.  Signed:  Santiago Bumpers, MD           11/11/2022

## 2022-11-13 DIAGNOSIS — E1140 Type 2 diabetes mellitus with diabetic neuropathy, unspecified: Secondary | ICD-10-CM | POA: Diagnosis not present

## 2022-11-13 DIAGNOSIS — I10 Essential (primary) hypertension: Secondary | ICD-10-CM | POA: Diagnosis not present

## 2022-11-16 ENCOUNTER — Other Ambulatory Visit: Payer: Self-pay | Admitting: Family Medicine

## 2022-11-18 ENCOUNTER — Ambulatory Visit (INDEPENDENT_AMBULATORY_CARE_PROVIDER_SITE_OTHER): Payer: Medicare Other | Admitting: Internal Medicine

## 2022-11-18 ENCOUNTER — Encounter: Payer: Self-pay | Admitting: Internal Medicine

## 2022-11-18 VITALS — BP 120/68 | HR 73 | Ht 68.0 in | Wt 159.0 lb

## 2022-11-18 DIAGNOSIS — E785 Hyperlipidemia, unspecified: Secondary | ICD-10-CM | POA: Diagnosis not present

## 2022-11-18 DIAGNOSIS — E039 Hypothyroidism, unspecified: Secondary | ICD-10-CM | POA: Diagnosis not present

## 2022-11-18 DIAGNOSIS — E1142 Type 2 diabetes mellitus with diabetic polyneuropathy: Secondary | ICD-10-CM

## 2022-11-18 LAB — POCT GLUCOSE (DEVICE FOR HOME USE): POC Glucose: 227 mg/dl — AB (ref 70–99)

## 2022-11-18 LAB — POCT GLYCOSYLATED HEMOGLOBIN (HGB A1C): Hemoglobin A1C: 7.2 % — AB (ref 4.0–5.6)

## 2022-11-18 MED ORDER — EMPAGLIFLOZIN 10 MG PO TABS
10.0000 mg | ORAL_TABLET | Freq: Every day | ORAL | 3 refills | Status: DC
Start: 2022-11-18 — End: 2022-11-28

## 2022-11-18 NOTE — Progress Notes (Unsigned)
Name: Marissa Hall  MRN/ DOB: [id number], 1940/06/14   Age/ Sex: 82 y.o., female    PCP: Jeoffrey Massed, MD   Reason for Endocrinology Evaluation: Type 2 Diabetes Mellitus     Date of Initial Endocrinology Visit: 07/20/2021    PATIENT IDENTIFIER: Marissa Hall is a 82 y.o. female with a past medical history of T2DM, Dyslipidemia, Hx of breast Ca, and Hypothyroidism. The patient presented for initial endocrinology clinic visit on 11/18/2022 for consultative assistance with her diabetes management.    HPI: Marissa Hall is transferring care from [facility] Dr. Shawnee Knapp    Diagnosed with DM ~ 2005 Prior Medications tried/Intolerance: just metformin            Hemoglobin A1c has ranged from 7.0% in 2019, peaking at 7.4% in 2021.  On her initial visit to our clinic she had an A1c of 7.1%, SUBJECTIVE:   During the last visit (05/18/2022): A1c 7.1%   Today (11/18/22): Marissa Hall is here for follow-up on diabetes management and hypothyroidism. She checks her blood sugars 1x daily . She did not bring her meter today   She continues to follow-up with oncology for Hx of breast CA diagnosed in 2017  She is participating in co-pilot program through insurance, which has helped improve compliance  Denies nausea, or vomiting  She has occasional  diarrhea that she attributes to metformin    HOME DIABETES REGIMEN: Metformin 500 mg XR , BID  Synthroid 112 mcg daily       Statin: yes ACE-I/ARB: yes    METER DOWNLOAD SUMMARY:  Range 150-180 mg/dl     DIABETIC COMPLICATIONS: Microvascular complications:  Neuropathy Denies: CKD, retinopathy Last eye exam: Completed 09/27/2021  Macrovascular complications:  Nonischemic cardiomyopathy, TIA Denies: CAD, PVD, CVA   PAST HISTORY: Past Medical History:  Past Medical History:  Diagnosis Date   Acute DVT (deep venous thrombosis) (HCC) 04/23/2019   Right popliteal   Alcoholism (HCC)    Anxiety and depression 1964    oncologist started duloxetine 12/2016   Breast cancer (HCC) 03/14/2016   Clinical stage 2A: (triple neg): Right breast, upper inner quadrant, 03/2016.  Neoadjuvant chemo x 5 cycles,lumpectomy 4 mo later, then RT started 10/2016.  Adjuvant Xeloda [id number].  SWOG research trial pt 04/2017--pt randomized to pembrolizumab immunotherapy.  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  A1c Mar 12, 2018 was 7.0% at Dr. Lacie Draft.    Epidermoid cyst of vulva    Chronic epidermoid cyst of the vulva.  Excision done 02/2019   GERD (gastroesophageal reflux disease) 2013   History of basal cell cancer    L ankle   Hyperlipidemia 1986   Hypertension 2008   2022 ->addition of hctz to lisinopril led to 25ml drop in GFR. HCTZ d/cd.   Hypothyroidism 1988   Diagnosed in her 12s.  Managed by Endocrinologist   Lumbar radiculopathy 04/2019   Dr. Katrinka Blazing to get plain films of LB and hip (considering MRI due to her hx of cancer)   Nonischemic cardiomyopathy (HCC)    Hx of takotsubo CM   Osteoporosis 2015   pt states "osteopenia", but  then says that she refused to take the rx med for this condition, so I suspect she had osteoporosis.   Peripheral neuropathy 2017   Patient states diabetic neuropathy in feet prior to starting chemotherapy and then worsened by chemo.    SCC (squamous cell carcinoma), arm, right    Syncope and collapse    05/2021 while in [place].  No prodrome.  Monitor ordered and cardiology referral ordered 05/14/21   TIA (transient ischemic attack) 03/26/2011   2012: question of (HA + R eye "floaters"). CT in ED neg acute. Not admitted, no  f/u testing done.  ?ocular migraine?   Past Surgical History:  Past Surgical History:  Procedure Laterality Date   ABDOMINAL HYSTERECTOMY  1972   APPENDECTOMY  1972   BREAST ENHANCEMENT SURGERY  1982   BREAST IMPLANT REMOVAL Right 09/13/2016   Procedure: REMOVAL RIGHT BREAST IMPLANT;  Surgeon: Glenna Fellows, MD;  Location: [facility];  Service: Plastics;  Laterality: Right;   BREAST LUMPECTOMY WITH RADIOACTIVE SEED AND SENTINEL LYMPH NODE BIOPSY Right 09/13/2016   Procedure: RIGHT BREAST LUMPECTOMY WITH RADIOACTIVE SEED X 2 AND SENTINEL LYMPH NODE BIOPSY;  Surgeon: Ovidio Kin, MD;  Location: [facility];  Service: General;  Laterality: Right;   BREAST SURGERY Right 03/14/2016   Biopsy   CAPSULECTOMY Right 09/13/2016   Procedure: RIGHT CAPSULECTOMY;  Surgeon: Glenna Fellows, MD;  Location: [facility];  Service: Plastics;  Laterality: Right;   CATARACT EXTRACTION, BILATERAL Bilateral 08/10/17 right eye, 08/31/17 left eye   MASS EXCISION Left 02/28/2018   Path: benign.  Procedure: EXCISIONLEFT MEDIAL THIGH MASS ERAS PATHWAY;  Surgeon: Harriette Bouillon, MD;  Location: [facility];  Service: General;  Laterality: Left;   PORTACATH PLACEMENT N/A 03/15/2016   Procedure: INSERTION PORT-A-CATH WITH [place];  Surgeon: Ovidio Kin, MD;  Location: [facility];  Service: General;  Laterality: N/A;   PORTACATH REMOVAL  07/2017   SLEEP STUDY  09/2021   NO SLEEP APNEA   surgical repair left ankle Left 2009   s/p Fall    TONSILLECTOMY AND ADENOIDECTOMY  1948   Age 89   TOTAL HIP ARTHROPLASTY Left 10/06/2021   Procedure: TOTAL HIP ARTHROPLASTY ANTERIOR APPROACH;  Surgeon: Eldred Manges, MD;  Location: [facility];  Service: Orthopedics;  Laterality: Left;   US CAROTID DOPPLER BILATERAL ([facility] HX)  01/2021   <50% bilat int carotid sten, otherwise normal.   Zio monitoring     06/2021; sinus, some mild brady, brief SVT, no signif PVCs    Social History:   reports that she quit smoking about 23 years ago. Her smoking use included cigarettes. She smoked an average of 1 pack per day. She has never used smokeless tobacco. She reports current alcohol use of about 7.0 standard drinks of alcohol per week. She reports that she does not use drugs. Family History:  Family History  Problem Relation Age of Onset   Stroke Mother    Suicidality Father    Stroke Brother    Stroke Son    Sleep apnea Son      HOME MEDICATIONS: Allergies as of 11/18/2022       Reactions   Other Other (See Comments)   STEROIDS- emotional   Prednisone Other (See Comments)   Other reaction(s): Mental Status Changes (intolerance)   Amlodipine Other (See Comments)   Elevated creatinine   Hydrochlorothiazide Other (See Comments)   Elevated serum creatinine   Penicillins Other (See Comments)  Unsure of reaction, was 82 years old        Medication List        Accurate as of November 18, 2022  7:35 AM. If you have any questions, ask your nurse or doctor.          atorvastatin 80 MG tablet Commonly known as: LIPITOR Take 1 tablet (80 mg total) by mouth every evening.   carvedilol 3.125 MG tablet Commonly known as: COREG Take 1 tablet (3.125 mg total) by mouth 2 (two) times daily with a meal.   cholecalciferol 25 MCG (1000 UNIT) tablet Commonly known as: VITAMIN D3 Take 1,000 Units by mouth daily.   cloNIDine 0.1 MG tablet Commonly known as: CATAPRES 1 tab po qhs   DULoxetine 60 MG capsule Commonly known as: CYMBALTA Take 1 capsule (60 mg total) by mouth daily.   felodipine 10 MG 24 hr tablet Commonly known as: PLENDIL Take 1 tablet (10 mg total) by mouth daily.   folic acid 1 MG tablet Commonly known as: FOLVITE Take 1 tablet (1 mg total) by mouth daily.   hydrALAZINE 25 MG tablet Commonly known as: APRESOLINE Take 1 tablet (25 mg total) by mouth 3 (three) times daily.   metFORMIN 500 MG 24 hr tablet Commonly known as: GLUCOPHAGE-XR Take 2  tablets (1,000 mg total) by mouth daily before supper.   multivitamin with minerals Tabs tablet Take 1 tablet by mouth daily.   Synthroid 112 MCG tablet Generic drug: levothyroxine Take 1 tablet (112 mcg total) by mouth daily before breakfast.   SYSTANE BALANCE OP Place 1 drop into both eyes daily as needed (for dry eyes).   thiamine 100 MG tablet Commonly known as: VITAMIN B1 Take 1 tablet (100 mg total) by mouth daily.   traZODone 50 MG tablet Commonly known as: DESYREL Take 1 tablet (50 mg total) by mouth at bedtime.   valsartan 160 MG tablet Commonly known as: DIOVAN Take 1 tablet (160 mg total) by mouth daily.         ALLERGIES: Allergies  Allergen Reactions   Other Other (See Comments)    STEROIDS- emotional   Prednisone Other (See Comments)    Other reaction(s): Mental Status Changes (intolerance)   Amlodipine Other (See Comments)    Elevated creatinine   Hydrochlorothiazide Other (See Comments)    Elevated serum creatinine   Penicillins Other (See Comments)    Unsure of reaction, was 82 years old     REVIEW OF SYSTEMS: A comprehensive ROS was conducted with the patient and is negative except as per HPI    OBJECTIVE:   VITAL SIGNS: There were no vitals taken for this visit.   PHYSICAL EXAM:  General: Pt appears well and is in NAD  Neck: General: Supple without adenopathy or carotid bruits. Thyroid: Thyroid size normal.  No goiter or nodules appreciated.  Lungs: Clear with good BS bilat with no rales, rhonchi, or wheezes  Heart: RRR   Abdomen: soft, nontender, without masses or organomegaly palpable  Extremities:  Lower extremities - No pretibial edema. No lesions.  Neuro: MS is good with appropriate affect, pt is alert and Ox3    DM foot exam: 11/18/2022  The skin of the feet is intact without sores or ulcerations. The pedal pulses are 2+ on right and 2+ on left. The sensation is intact to a screening 5.07, 10 gram monofilament  bilaterally   DATA REVIEWED:  Lab Results  Component Value Date   HGBA1C 7.0 (A) 05/18/2022  HGBA1C 7.1 (A) 07/20/2021   HGBA1C 7.2 (A) 02/23/2021    Latest Reference Range & Units 05/18/22 14:35  TSH 0.35 - 5.50 uIU/mL 0.05 (L)     Latest Reference Range & Units 05/02/22 11:28  Sodium 135 - 145 mmol/L 135  Potassium 3.5 - 5.1 mmol/L 5.3 (H)  Chloride 98 - 111 mmol/L 99  CO2 22 - 32 mmol/L 28  Glucose 70 - 99 mg/dL 409 (H)  BUN 8 - 23 mg/dL 24 (H)  Creatinine 8.11 - 1.00 mg/dL 9.14  Calcium 8.9 - 78.2 mg/dL 95.6  Anion gap 5 - 15  8  Alkaline Phosphatase 38 - 126 U/L 70  Albumin 3.5 - 5.0 g/dL 4.8  AST 15 - 41 U/L 22  ALT 0 - 44 U/L 29  Total Protein 6.5 - 8.1 g/dL 7.4  Total Bilirubin 0.3 - 1.2 mg/dL 1.8 (H)  GFR, Est Non African American >60 mL/min 57 (L)    In-office BG 227 mg/dL    ASSESSMENT / PLAN / RECOMMENDATIONS:   1) Type 2 Diabetes Mellitus, Optimally controlled, With neuropathic and Macrovascular complications - Most recent A1c of 7.2 %. Goal A1c < 7.5 %.    -A1c acceptable but is trending up -In office BG, postprandial 227 mg/DL -Intolerant to higher doses of metformin due to diarrhea -I have recommended trying Jardiance, cautioned against genital infections, if this is cost prohibitive we will proceed with insulin secretagogue   MEDICATIONS: Continue Metformin 500 mg XR , BID Start Jardiance 10 mg daily  EDUCATION / INSTRUCTIONS: BG monitoring instructions: Patient is instructed to check her blood sugars 1 times a day, fasting . Call [facility] if: BG persistently < 70  I reviewed the Rule of 15 for the treatment of hypoglycemia in detail with the patient. Literature supplied.   2) Diabetic complications:  Eye: Does not have known diabetic retinopathy.  Neuro/ Feet: Does  have known diabetic peripheral neuropathy. Renal: Patient does not have known baseline CKD. She is  on an ACEI/ARB at present.     3) Hypothyroidism  :  - TSH ****   Medication    Synthroid 112 mcg daily    F/U in 6 months   Signed electronically by: Lyndle Herrlich, MD  [facility]  [facility] [address] [place], [place] [address] Phone: [phone] FAX: [phone]   CC: Jeoffrey Massed, MD [address] [place] [address] Phone: [phone]  Fax: [phone]    Return to Endocrinology clinic as below: Future Appointments  Date Time Provider Department Center  11/18/2022  1:40 PM Kentley Cedillo, Konrad Dolores, MD [facility] None  05/09/2023 10:30 AM Serena Croissant, MD [facility] None  09/20/2023 10:30 AM [facility] ANNUAL WELLNESS VISIT [facility]

## 2022-11-18 NOTE — Patient Instructions (Addendum)
Continue Metformin 500 mg, 1 tablet twice daily  Start Jardiance 10 mg daily

## 2022-11-19 LAB — LIPID PANEL
Chol/HDL Ratio: 2.1 ratio (ref 0.0–4.4)
Cholesterol, Total: 167 mg/dL (ref 100–199)
HDL: 78 mg/dL (ref >39)
LDL Chol Calc (NIH): 60 mg/dL (ref 0–99)
Triglycerides: 179 mg/dL — ABNORMAL HIGH (ref 0–149)
VLDL Cholesterol Cal: 29 mg/dL (ref 5–40)

## 2022-11-19 LAB — TSH: TSH: 1.5 u[IU]/mL (ref 0.450–4.500)

## 2022-11-21 MED ORDER — SYNTHROID 112 MCG PO TABS: 112.0000 ug | ORAL_TABLET | Freq: Every day | ORAL | 3 refills | Status: DC

## 2022-11-21 MED ORDER — ATORVASTATIN CALCIUM 80 MG PO TABS: 80.0000 mg | ORAL_TABLET | Freq: Every evening | ORAL | 3 refills | Status: DC

## 2022-11-24 ENCOUNTER — Encounter: Payer: Self-pay | Admitting: Internal Medicine

## 2022-11-24 DIAGNOSIS — E1140 Type 2 diabetes mellitus with diabetic neuropathy, unspecified: Secondary | ICD-10-CM | POA: Diagnosis not present

## 2022-11-24 DIAGNOSIS — I10 Essential (primary) hypertension: Secondary | ICD-10-CM | POA: Diagnosis not present

## 2022-11-28 MED ORDER — GLIMEPIRIDE 1 MG PO TABS
1.0000 mg | ORAL_TABLET | Freq: Every day | ORAL | 3 refills | Status: DC
Start: 2022-11-28 — End: 2023-08-17

## 2022-11-29 DIAGNOSIS — E119 Type 2 diabetes mellitus without complications: Secondary | ICD-10-CM | POA: Diagnosis not present

## 2022-11-29 DIAGNOSIS — H524 Presbyopia: Secondary | ICD-10-CM | POA: Diagnosis not present

## 2022-11-29 DIAGNOSIS — Z7984 Long term (current) use of oral hypoglycemic drugs: Secondary | ICD-10-CM | POA: Diagnosis not present

## 2022-11-29 DIAGNOSIS — H35361 Drusen (degenerative) of macula, right eye: Secondary | ICD-10-CM | POA: Diagnosis not present

## 2022-11-29 DIAGNOSIS — H0288A Meibomian gland dysfunction right eye, upper and lower eyelids: Secondary | ICD-10-CM | POA: Diagnosis not present

## 2022-11-29 DIAGNOSIS — H02834 Dermatochalasis of left upper eyelid: Secondary | ICD-10-CM | POA: Diagnosis not present

## 2022-11-29 DIAGNOSIS — H40003 Preglaucoma, unspecified, bilateral: Secondary | ICD-10-CM | POA: Diagnosis not present

## 2022-11-29 DIAGNOSIS — H0288B Meibomian gland dysfunction left eye, upper and lower eyelids: Secondary | ICD-10-CM | POA: Diagnosis not present

## 2022-11-29 DIAGNOSIS — H02831 Dermatochalasis of right upper eyelid: Secondary | ICD-10-CM | POA: Diagnosis not present

## 2022-11-29 DIAGNOSIS — H04123 Dry eye syndrome of bilateral lacrimal glands: Secondary | ICD-10-CM | POA: Diagnosis not present

## 2022-11-29 DIAGNOSIS — D3132 Benign neoplasm of left choroid: Secondary | ICD-10-CM | POA: Diagnosis not present

## 2022-12-04 DIAGNOSIS — E1140 Type 2 diabetes mellitus with diabetic neuropathy, unspecified: Secondary | ICD-10-CM | POA: Diagnosis not present

## 2022-12-04 DIAGNOSIS — I10 Essential (primary) hypertension: Secondary | ICD-10-CM | POA: Diagnosis not present

## 2022-12-13 DIAGNOSIS — I10 Essential (primary) hypertension: Secondary | ICD-10-CM | POA: Diagnosis not present

## 2022-12-13 DIAGNOSIS — E1140 Type 2 diabetes mellitus with diabetic neuropathy, unspecified: Secondary | ICD-10-CM | POA: Diagnosis not present

## 2023-01-04 DIAGNOSIS — E1140 Type 2 diabetes mellitus with diabetic neuropathy, unspecified: Secondary | ICD-10-CM | POA: Diagnosis not present

## 2023-01-04 DIAGNOSIS — I10 Essential (primary) hypertension: Secondary | ICD-10-CM | POA: Diagnosis not present

## 2023-01-16 ENCOUNTER — Other Ambulatory Visit: Payer: Self-pay

## 2023-01-16 DIAGNOSIS — E1142 Type 2 diabetes mellitus with diabetic polyneuropathy: Secondary | ICD-10-CM

## 2023-01-16 MED ORDER — METFORMIN HCL ER 500 MG PO TB24
1000.0000 mg | ORAL_TABLET | Freq: Every day | ORAL | 3 refills | Status: DC
Start: 2023-01-16 — End: 2023-05-29

## 2023-02-03 NOTE — Progress Notes (Signed)
Appointment canceled by provider ?

## 2023-02-04 DIAGNOSIS — E1140 Type 2 diabetes mellitus with diabetic neuropathy, unspecified: Secondary | ICD-10-CM | POA: Diagnosis not present

## 2023-02-04 DIAGNOSIS — I10 Essential (primary) hypertension: Secondary | ICD-10-CM | POA: Diagnosis not present

## 2023-02-08 ENCOUNTER — Other Ambulatory Visit: Payer: Self-pay | Admitting: Family Medicine

## 2023-02-08 DIAGNOSIS — H02831 Dermatochalasis of right upper eyelid: Secondary | ICD-10-CM | POA: Diagnosis not present

## 2023-02-08 DIAGNOSIS — Z9011 Acquired absence of right breast and nipple: Secondary | ICD-10-CM | POA: Diagnosis not present

## 2023-02-08 DIAGNOSIS — H02834 Dermatochalasis of left upper eyelid: Secondary | ICD-10-CM | POA: Diagnosis not present

## 2023-02-08 DIAGNOSIS — Z853 Personal history of malignant neoplasm of breast: Secondary | ICD-10-CM | POA: Diagnosis not present

## 2023-02-08 DIAGNOSIS — Z923 Personal history of irradiation: Secondary | ICD-10-CM | POA: Diagnosis not present

## 2023-03-06 ENCOUNTER — Other Ambulatory Visit: Payer: Self-pay | Admitting: Family Medicine

## 2023-03-06 DIAGNOSIS — I10 Essential (primary) hypertension: Secondary | ICD-10-CM | POA: Diagnosis not present

## 2023-03-06 DIAGNOSIS — E1140 Type 2 diabetes mellitus with diabetic neuropathy, unspecified: Secondary | ICD-10-CM | POA: Diagnosis not present

## 2023-03-15 ENCOUNTER — Other Ambulatory Visit: Payer: Self-pay | Admitting: Family Medicine

## 2023-03-15 NOTE — Telephone Encounter (Signed)
felodipine (PLENDIL) 10 MG 24 hr tablet [[id number]]   Order Details Dose: 10 mg Route: Oral Frequency: Daily  Dispense Quantity: 90 tablet Refills: 1        Sig: Take 1 tablet (10 mg total) by mouth daily.       Start Date: 09/27/22 End Date: --  Written Date: 09/27/22 Ex    Based on recent visits, pt may have established care with another provider (LaToya Hopkins, [facility]).    LVM for pt to return call and confirm.

## 2023-03-17 NOTE — Telephone Encounter (Signed)
LVM for pt to return call regarding refill.

## 2023-03-20 DIAGNOSIS — I10 Essential (primary) hypertension: Secondary | ICD-10-CM | POA: Diagnosis not present

## 2023-03-20 DIAGNOSIS — E1140 Type 2 diabetes mellitus with diabetic neuropathy, unspecified: Secondary | ICD-10-CM | POA: Diagnosis not present

## 2023-04-05 NOTE — Telephone Encounter (Signed)
Pt is scheduled for virtual @ 11A tomorrow.

## 2023-04-05 NOTE — Telephone Encounter (Signed)
Can put on schedule tomorrow at 11 AM or 11:20 AM.  Virtual fine.

## 2023-04-06 ENCOUNTER — Telehealth: Payer: Medicare Other | Admitting: Family Medicine

## 2023-04-06 ENCOUNTER — Encounter: Payer: Self-pay | Admitting: Family Medicine

## 2023-04-06 VITALS — BP 155/60

## 2023-04-06 DIAGNOSIS — K529 Noninfective gastroenteritis and colitis, unspecified: Secondary | ICD-10-CM

## 2023-04-06 DIAGNOSIS — E1140 Type 2 diabetes mellitus with diabetic neuropathy, unspecified: Secondary | ICD-10-CM | POA: Diagnosis not present

## 2023-04-06 DIAGNOSIS — R0989 Other specified symptoms and signs involving the circulatory and respiratory systems: Secondary | ICD-10-CM | POA: Diagnosis not present

## 2023-04-06 DIAGNOSIS — I10 Essential (primary) hypertension: Secondary | ICD-10-CM | POA: Diagnosis not present

## 2023-04-06 MED ORDER — VALSARTAN 160 MG PO TABS: 160.0000 mg | ORAL_TABLET | Freq: Every day | ORAL | 1 refills | Status: DC

## 2023-04-06 MED ORDER — CIPROFLOXACIN HCL 500 MG PO TABS: 500.0000 mg | ORAL_TABLET | Freq: Two times a day (BID) | ORAL | 0 refills | Status: AC

## 2023-04-06 NOTE — Progress Notes (Signed)
Virtual Visit via Video Note  I connected with Marissa Hall  on 04/06/23 at 11:00 AM EDT by a video enabled telemedicine application and verified that I am speaking with the correct person using two identifiers.  Location patient: [place] Location provider:work or home office Persons participating in the virtual visit: patient, provider  I discussed the limitations and requested verbal permission for telemedicine visit. The patient expressed understanding and agreed to proceed.  CC: Uncontrolled hypertension, some abdominal pain recently A/P at last visit 11/11/22: #1 labile hypertension, control is pretty good and stable. I think we will keep her medication regimen the same: Coreg 3.125 mg twice a day chronic clonidine 0.1 mg nightly, felodipine 10 mg daily, and hydralazine 25 mg 3 times a day (she manages to get the hydralazine in once a day usually).   #2 palpitations, history of brief runs of SVT on rhythm monitoring January 2023. These have resolved since I last saw her.  Stress reduction has helped.  INTERIM HX: 4 days ago [place] started developing abdominal cramps, initially very severe.  Also started getting frequent urgent watery bowel movements.  She does see some mucus in the bowel movements.  She has had some bright red blood per rectum that she thinks is from her hemorrhoids. She has had some nausea but no vomiting and she has not required nausea medication.  She is drinking water well. Has significant fatigue.  No fever.  She has missed work all week. Last week she did eat a big Mac at [facility] but did not have anything with their onions on it. She went on a cruise to [place] on October 6 to October 11 but did not eat anything in [place].  She ate on the cruise ship. When she got back she felt okay other than some fatigue. She has not had any antibiotics in the recent months.  Blood pressures have been elevated some this week, typically 150s to 170 systolic.  ROS: See pertinent  positives and negatives per HPI.  Past Medical History:  Diagnosis Date   Acute DVT (deep venous thrombosis) (HCC) 04/23/2019   Right popliteal   Alcoholism (HCC)    Anxiety and depression 1964   oncologist started duloxetine 12/2016   Breast cancer (HCC) 03/14/2016   Clinical stage 2A: (triple neg): Right breast, upper inner quadrant, 03/2016.  Neoadjuvant chemo x 5 cycles,lumpectomy 4 mo later, then RT started 10/2016.  Adjuvant Xeloda [id number].  SWOG research trial pt 04/2017--pt randomized to pembrolizumab immunotherapy.  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  A1c Mar 12, 2018 was 7.0% at Dr. Lacie Draft.    Epidermoid cyst of vulva    Chronic epidermoid cyst of the vulva.  Excision done 02/2019   GERD (gastroesophageal reflux disease) 2013   History of basal cell cancer    L ankle   Hyperlipidemia 1986   Hypertension 2008   2022 ->addition of hctz to lisinopril led to 25ml drop in GFR. HCTZ d/cd.   Hypothyroidism 1988   Diagnosed in her 58s.  Managed by Endocrinologist   Lumbar radiculopathy 04/2019   Dr. Katrinka Blazing to get plain films of LB  and hip (considering MRI due to her hx of cancer)   Nonischemic cardiomyopathy (HCC)    Hx of takotsubo CM   Osteoporosis 2015   pt states "osteopenia", but then says that she refused to take the rx med for this condition, so I suspect she had osteoporosis.   Peripheral neuropathy 2017   Patient states diabetic neuropathy in feet prior to starting chemotherapy and then worsened by chemo.    SCC (squamous cell carcinoma), arm, right    Syncope and collapse    05/2021 while in  [place].  No prodrome.  Monitor ordered and cardiology referral ordered 05/14/21   TIA (transient ischemic attack) 03/26/2011   2012: question of (HA + R eye "floaters"). CT in ED neg acute. Not admitted, no f/u testing done.  ?ocular migraine?    Past Surgical History:  Procedure Laterality Date   ABDOMINAL HYSTERECTOMY  1972   APPENDECTOMY  1972   BREAST ENHANCEMENT SURGERY  1982   BREAST IMPLANT REMOVAL Right 09/13/2016   Procedure: REMOVAL RIGHT BREAST IMPLANT;  Surgeon: Glenna Fellows, MD;  Location: [facility];  Service: Plastics;  Laterality: Right;   BREAST LUMPECTOMY WITH RADIOACTIVE SEED AND SENTINEL LYMPH NODE BIOPSY Right 09/13/2016   Procedure: RIGHT BREAST LUMPECTOMY WITH RADIOACTIVE SEED X 2 AND SENTINEL LYMPH NODE BIOPSY;  Surgeon: Ovidio Kin, MD;  Location: [facility];  Service: General;  Laterality: Right;   BREAST SURGERY Right 03/14/2016   Biopsy   CAPSULECTOMY Right 09/13/2016   Procedure: RIGHT CAPSULECTOMY;  Surgeon: Glenna Fellows, MD;  Location: [facility];  Service: Plastics;  Laterality: Right;   CATARACT EXTRACTION, BILATERAL Bilateral 08/10/17 right eye, 08/31/17 left eye   MASS EXCISION Left 02/28/2018   Path: benign.  Procedure: EXCISIONLEFT MEDIAL THIGH MASS ERAS PATHWAY;  Surgeon: Harriette Bouillon, MD;  Location:  SUR[facility]ERY CENTER;  Service: General;  Laterality: Left;   PORTACATH PLACEMENT N/A 03/15/2016   Procedure: INSERTION PORT-A-CATH WITH [place];  Surgeon: Ovidio Kin, MD;  Location: [facility];  Service: General;  Laterality: N/A;   PORTACATH REMOVAL  07/2017   SLEEP STUDY  09/2021   NO SLEEP APNEA   surgical repair left ankle Left 2009   s/p Fall    TONSILLECTOMY AND ADENOIDECTOMY  1948   Age 43   TOTAL HIP ARTHROPLASTY Left 10/06/2021   Procedure: TOTAL HIP ARTHROPLASTY ANTERIOR APPROACH;  Surgeon: Eldred Manges, MD;  Location: [facility];  Service: Orthopedics;  Laterality: Left;   US CAROTID DOPPLER  BILATERAL ([facility] HX)  01/2021   <50% bilat int carotid sten, otherwise normal.   Zio monitoring     06/2021; sinus, some mild brady, brief SVT, no signif PVCs     Current Outpatient Medications:    atorvastatin (LIPITOR) 80 MG tablet, Take 1 tablet (80 mg total) by mouth every evening., Disp: 90 tablet, Rfl: 3   carvedilol (COREG) 3.125 MG tablet, Take 1 tablet (3.125 mg total) by mouth 2 (two) times daily with a meal., Disp: 180 tablet, Rfl: 1   cholecalciferol (VITAMIN D3) 25 MCG (1000 UNIT) tablet, Take 1,000 Units by mouth daily., Disp: , Rfl:    DULoxetine (CYMBALTA) 60 MG capsule, Take 1 capsule (60 mg total) by mouth daily., Disp: 90 capsule, Rfl: 1   felodipine (PLENDIL) 10 MG 24 hr tablet, Take 1 tablet (10 mg total) by mouth daily., Disp: 90 tablet, Rfl: 1   folic acid (FOLVITE) 1 MG tablet, Take 1 tablet (1 mg total)  by mouth daily., Disp: , Rfl:    glimepiride (AMARYL) 1 MG tablet, Take 1 tablet (1 mg total) by mouth daily with breakfast., Disp: 90 tablet, Rfl: 3   hydrALAZINE (APRESOLINE) 25 MG tablet, Take 1 tablet (25 mg total) by mouth 3 (three) times daily., Disp: 270 tablet, Rfl: 1   metFORMIN (GLUCOPHAGE-XR) 500 MG 24 hr tablet, Take 2 tablets (1,000 mg total) by mouth daily before supper., Disp: 180 tablet, Rfl: 3   Multiple Vitamin (MULTIVITAMIN WITH MINERALS) TABS tablet, Take 1 tablet by mouth daily., Disp: , Rfl:    Propylene Glycol (SYSTANE BALANCE OP), Place 1 drop into both eyes daily as needed (for dry eyes)., Disp: , Rfl:    SYNTHROID 112 MCG tablet, Take 1 tablet (112 mcg total) by mouth daily before breakfast., Disp: 90 tablet, Rfl: 3   thiamine 100 MG tablet, Take 1 tablet (100 mg total) by mouth daily., Disp: , Rfl:    traZODone (DESYREL) 50 MG tablet, Take 1 tablet (50 mg total) by mouth at bedtime., Disp: 90 tablet, Rfl: 1   valsartan (DIOVAN) 160 MG tablet, Take 1 tablet (160 mg total) by mouth daily., Disp: 30 tablet, Rfl: 0   ciprofloxacin (CIPRO) 500 MG  tablet, Take 1 tablet (500 mg total) by mouth 2 (two) times daily for 5 days., Disp: 10 tablet, Rfl: 0   cloNIDine (CATAPRES) 0.1 MG tablet, 1 tab po qhs (Patient not taking: Reported on 04/06/2023), Disp: 90 tablet, Rfl: 1  EXAM:  VITALS per patient if applicable:     04/06/2023   10:50 AM 11/18/2022    1:55 PM 11/11/2022    2:43 PM  Vitals with BMI  Height  5\' 8"  5\' 8"   Weight  159 lbs 159 lbs 3 oz  BMI  24.18 24.21  Systolic 155 120 782  Diastolic 60 68 67  Pulse  73 58     GENERAL: alert, oriented, appears tired but in no acute distress  HEENT: atraumatic, conjunttiva clear, no obvious abnormalities on inspection of external nose and ears  NECK: normal movements of the head and neck  LUNGS: on inspection no signs of respiratory distress, breathing rate appears normal, no obvious gross SOB, gasping or wheezing  CV: no obvious cyanosis  MS: moves all visible extremities without noticeable abnormality  PSYCH/NEURO: pleasant and cooperative, no obvious depression or anxiety, speech and thought processing grossly intact  LABS: none today    Chemistry      Component Value Date/Time   NA 138 09/27/2022 1157   NA 136 05/01/2017 0914   K 4.6 09/27/2022 1157   K 4.5 05/01/2017 0914   CL 100 09/27/2022 1157   CO2 27 09/27/2022 1157   CO2 24 05/01/2017 0914   BUN 17 09/27/2022 1157   BUN 14.9 05/01/2017 0914   CREATININE 0.84 09/27/2022 1157   CREATININE 1.00 05/02/2022 1128   CREATININE 0.8 05/01/2017 0914   GLU 181 09/20/2019 0000      Component Value Date/Time   CALCIUM 9.8 09/27/2022 1157   CALCIUM 9.6 05/01/2017 0914   ALKPHOS 70 05/02/2022 1128   ALKPHOS 75 05/01/2017 0914   AST 22 05/02/2022 1128   AST 51 (H) 05/01/2017 0914   ALT 29 05/02/2022 1128   ALT 62 (H) 05/01/2017 0914   BILITOT 1.8 (H) 05/02/2022 1128   BILITOT 1.30 (H) 05/01/2017 0914     Lab Results  Component Value Date   WBC 4.2 05/02/2022   HGB 12.9 05/02/2022  HCT 37.7 05/02/2022    MCV 96.2 05/02/2022   PLT 171 05/02/2022   ASSESSMENT AND PLAN:  Discussed the following assessment and plan:  #1 acute gastroenteritis, possibly bacterial. Will treat with empiric Cipro 500 mg twice daily x 5 days. Imodium is helping with the urgency and frequency of her diarrhea adequately at this time. She has anti-nausea medication at home if she needs it. Discussed importance of electrolyte-rich fluids.   #2 labile hypertension. A little worse controlled this week associated with her current illness. Continue to monitor. No medication changes at this time---->Coreg 3.125 mg twice a day, valsartan 160 mg daily, clonidine 0.1 mg nightly, felodipine 10 mg daily, and hydralazine 25 mg 3 times a day (she manages to get the hydralazine in once a day usually).  I discussed the assessment and treatment plan with the patient. The patient was provided an opportunity to ask questions and all were answered. The patient agreed with the plan and demonstrated an understanding of the instructions.   F/u: 4 to 5 days  Signed:  Santiago Bumpers, MD           04/06/2023

## 2023-04-20 ENCOUNTER — Encounter: Payer: Self-pay | Admitting: Family Medicine

## 2023-04-20 ENCOUNTER — Ambulatory Visit (INDEPENDENT_AMBULATORY_CARE_PROVIDER_SITE_OTHER): Payer: Medicare Other | Admitting: Family Medicine

## 2023-04-20 VITALS — BP 145/73 | HR 76 | Temp 98.0°F | Wt 168.8 lb

## 2023-04-20 DIAGNOSIS — I10 Essential (primary) hypertension: Secondary | ICD-10-CM | POA: Diagnosis not present

## 2023-04-20 DIAGNOSIS — K529 Noninfective gastroenteritis and colitis, unspecified: Secondary | ICD-10-CM

## 2023-04-20 DIAGNOSIS — Z23 Encounter for immunization: Secondary | ICD-10-CM | POA: Diagnosis not present

## 2023-04-20 DIAGNOSIS — K580 Irritable bowel syndrome with diarrhea: Secondary | ICD-10-CM

## 2023-04-20 NOTE — Progress Notes (Signed)
OFFICE VISIT  04/20/2023  CC:  Chief Complaint  Patient presents with   GI Problem    No meds taken today    Patient is a 82 y.o. female who presents for 2-week follow-up of gastroenteritis and labile hypertension. A/P as of last visit (virtual visit): "#1 acute gastroenteritis, possibly bacterial. Will treat with empiric Cipro 500 mg twice daily x 5 days. Imodium is helping with the urgency and frequency of her diarrhea adequately at this time. She has anti-nausea medication at home if she needs it. Discussed importance of electrolyte-rich fluids.   #2 labile hypertension. A little worse controlled this week associated with her current illness. Continue to monitor. No medication changes at this time---->Coreg 3.125 mg twice a day, valsartan 160 mg daily, clonidine 0.1 mg nightly, felodipine 10 mg daily, and hydralazine 25 mg 3 times a day (she manages to get the hydralazine in once a day usually)."  INTERIM HX:  Abdominal pain has stopped but she still having some loose and urgent bowel movements.  She finished her Cipro.  No blood in stool.  No fevers. Imodium does help.  She took a cruise to [place] several weeks ago.  She ate and drank on the ship for everything except 1 meal. Interestingly she, she says her well water smells like sulfur lately.  Of note, she has not been taking metformin because her sugars have been very good. She has had IBS with diarrhea symptoms for years, waxes and wanes in severity without clear trigger.  Home blood pressures have been anywhere from 140s to 160s over 70s to 90s.  She also notes recently having more problem with chronic low-level severity nausea with some intermittent right upper quadrant pain and bloating.  She feels like the symptoms are consistently worse after coffee but is not so sure about being worse after typical meals. No vomiting. She has occasional feeling of significant gastroesophageal reflux. She does not take NSAIDs  regularly.  ROS as above, plus-->no CP, no SOB, no wheezing, no cough, no dizziness, no HAs, no rashes, no melena/hematochezia.  No polyuria or polydipsia.  No myalgias or arthralgias.  No focal weakness, paresthesias, or tremors.  No acute vision or hearing abnormalities.  No dysuria or unusual/new urinary urgency or frequency.  No recent changes in lower legs.   No palpitations.    Past Medical History:  Diagnosis Date   Acute DVT (deep venous thrombosis) (HCC) 04/23/2019   Right popliteal   Alcoholism (HCC)    Anxiety and depression 1964   oncologist started duloxetine 12/2016   Breast cancer (HCC) 03/14/2016   Clinical stage 2A: (triple neg): Right breast, upper inner quadrant, 03/2016.  Neoadjuvant chemo x 5 cycles,lumpectomy 4 mo later, then RT started 10/2016.  Adjuvant Xeloda [id number].  SWOG research trial pt 04/2017--pt randomized to pembrolizumab immunotherapy.  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  A1c Mar 12, 2018 was 7.0% at Dr. Lacie Draft.    Epidermoid cyst of vulva    Chronic epidermoid cyst of the vulva.  Excision done 02/2019   GERD (gastroesophageal reflux disease) 2013   History of basal cell cancer  L ankle   Hyperlipidemia 1986   Hypertension 2008   2022 ->addition of hctz to lisinopril led to 25ml drop in GFR. HCTZ d/cd.   Hypothyroidism 1988   Diagnosed in her 66s.  Managed by Endocrinologist   Lumbar radiculopathy 04/2019   Dr. Katrinka Blazing to get plain films of LB and hip (considering MRI due to her hx of cancer)   Nonischemic cardiomyopathy (HCC)    Hx of takotsubo CM   Osteoporosis 2015   pt  states "osteopenia", but then says that she refused to take the rx med for this condition, so I suspect she had osteoporosis.   Peripheral neuropathy 2017   Patient states diabetic neuropathy in feet prior to starting chemotherapy and then worsened by chemo.    SCC (squamous cell carcinoma), arm, right    Syncope and collapse    05/2021 while in [place].  No prodrome.  Monitor ordered and cardiology referral ordered 05/14/21   TIA (transient ischemic attack) 03/26/2011   2012: question of (HA + R eye "floaters"). CT in ED neg acute. Not admitted, no f/u testing done.  ?ocular migraine?    Past Surgical History:  Procedure Laterality Date   ABDOMINAL HYSTERECTOMY  1972   APPENDECTOMY  1972   BREAST ENHANCEMENT SURGERY  1982   BREAST IMPLANT REMOVAL Right 09/13/2016   Procedure: REMOVAL RIGHT BREAST IMPLANT;  Surgeon: Glenna Fellows, MD;  Location: [facility];  Service: Plastics;  Laterality: Right;   BREAST LUMPECTOMY WITH RADIOACTIVE SEED AND SENTINEL LYMPH NODE BIOPSY Right 09/13/2016   Procedure: RIGHT BREAST LUMPECTOMY WITH RADIOACTIVE SEED X 2 AND SENTINEL LYMPH NODE BIOPSY;  Surgeon: Ovidio Kin, MD;  Location: [facility];  Service: General;  Laterality: Right;   BREAST SURGERY Right 03/14/2016   Biopsy   CAPSULECTOMY Right 09/13/2016   Procedure: RIGHT CAPSULECTOMY;  Surgeon: Glenna Fellows, MD;  Location: [facility];  Service: Plastics;  Laterality: Right;   CATARACT EXTRACTION, BILATERAL Bilateral 08/10/17 right eye, 08/31/17 left eye   MASS EXCISION Left 02/28/2018   Path: benign.  Procedure: EXCISIONLEFT MEDIAL THIGH MASS ERAS PATHWAY;  Surgeon: Harriette Bouillon, MD;  Location: [facility];  Service: General;  Laterality: Left;   PORTACATH PLACEMENT N/A 03/15/2016   Procedure: INSERTION PORT-A-CATH WITH [place];  Surgeon: Ovidio Kin, MD;  Location: [facility];  Service: General;  Laterality: N/A;   PORTACATH REMOVAL  07/2017    SLEEP STUDY  09/2021   NO SLEEP APNEA   surgical repair left ankle Left 2009   s/p Fall    TONSILLECTOMY AND ADENOIDECTOMY  1948   Age 85   TOTAL HIP ARTHROPLASTY Left 10/06/2021   Procedure: TOTAL HIP ARTHROPLASTY ANTERIOR APPROACH;  Surgeon: Eldred Manges, MD;  Location: [facility];  Service: Orthopedics;  Laterality: Left;   US CAROTID DOPPLER BILATERAL ([facility] HX)  01/2021   <50% bilat int carotid sten, otherwise normal.   Zio monitoring     06/2021; sinus, some mild brady, brief SVT, no signif PVCs    Outpatient Medications Prior to Visit  Medication Sig Dispense Refill   atorvastatin (LIPITOR) 80 MG tablet Take 1 tablet (80 mg total) by mouth every evening. 90 tablet 3   carvedilol (COREG) 3.125 MG tablet Take 1 tablet (3.125 mg total) by mouth 2 (two) times daily with a meal. 180 tablet 1   cholecalciferol (VITAMIN D3) 25 MCG (1000 UNIT) tablet Take 1,000 Units by mouth daily.     cloNIDine (CATAPRES)  0.1 MG tablet 1 tab po qhs 90 tablet 1   DULoxetine (CYMBALTA) 60 MG capsule Take 1 capsule (60 mg total) by mouth daily. 90 capsule 1   felodipine (PLENDIL) 10 MG 24 hr tablet Take 1 tablet (10 mg total) by mouth daily. 90 tablet 1   folic acid (FOLVITE) 1 MG tablet Take 1 tablet (1 mg total) by mouth daily.     glimepiride (AMARYL) 1 MG tablet Take 1 tablet (1 mg total) by mouth daily with breakfast. 90 tablet 3   hydrALAZINE (APRESOLINE) 25 MG tablet Take 1 tablet (25 mg total) by mouth 3 (three) times daily. 270 tablet 1   Multiple Vitamin (MULTIVITAMIN WITH MINERALS) TABS tablet Take 1 tablet by mouth daily.     Propylene Glycol (SYSTANE BALANCE OP) Place 1 drop into both eyes daily as needed (for dry eyes).     SYNTHROID 112 MCG tablet Take 1 tablet (112 mcg total) by mouth daily before breakfast. 90 tablet 3   thiamine 100 MG tablet Take 1 tablet (100 mg total) by mouth daily.     traZODone (DESYREL) 50 MG tablet Take 1 tablet (50 mg total) by mouth at bedtime. 90 tablet 1    valsartan (DIOVAN) 160 MG tablet Take 1 tablet (160 mg total) by mouth daily. 90 tablet 1   metFORMIN (GLUCOPHAGE-XR) 500 MG 24 hr tablet Take 2 tablets (1,000 mg total) by mouth daily before supper. (Patient not taking: Reported on 04/20/2023) 180 tablet 3   No facility-administered medications prior to visit.    Allergies  Allergen Reactions   Other Other (See Comments)    STEROIDS- emotional   Prednisone Other (See Comments)    Other reaction(s): Mental Status Changes (intolerance)   Amlodipine Other (See Comments)    Elevated creatinine   Hydrochlorothiazide Other (See Comments)    Elevated serum creatinine   Penicillins Other (See Comments)    Unsure of reaction, was 82 years old    Review of Systems As per HPI  PE:    04/20/2023    2:00 PM 04/20/2023    1:59 PM 04/06/2023   10:50 AM  Vitals with BMI  Weight  168 lbs 13 oz   Systolic 145 167 409  Diastolic 73 90 60  Pulse  76      Physical Exam Exam chaperoned by Harlene Salts, CMA  Gen: Alert, well appearing.  Patient is oriented to person, place, time, and situation. AFFECT: pleasant, lucid thought and speech. ABD: soft, NT, ND, BS normal.  No hepatospenomegaly or mass.  No bruits.  LABS:  Last CBC Lab Results  Component Value Date   WBC 4.2 05/02/2022   HGB 12.9 05/02/2022   HCT 37.7 05/02/2022   MCV 96.2 05/02/2022   MCH 32.9 05/02/2022   RDW 13.0 05/02/2022   PLT 171 05/02/2022   Last metabolic panel Lab Results  Component Value Date   GLUCOSE 135 (H) 09/27/2022   NA 138 09/27/2022   K 4.6 09/27/2022   CL 100 09/27/2022   CO2 27 09/27/2022   BUN 17 09/27/2022   CREATININE 0.84 09/27/2022   GFR 66.09 09/27/2022   CALCIUM 9.8 09/27/2022   PROT 7.4 05/02/2022   ALBUMIN 4.8 05/02/2022   BILITOT 1.8 (H) 05/02/2022   ALKPHOS 70 05/02/2022   AST 22 05/02/2022   ALT 29 05/02/2022   ANIONGAP 8 05/02/2022   Last hemoglobin A1c Lab Results  Component Value Date   HGBA1C 7.2 (A) 11/18/2022  Last thyroid functions Lab Results  Component Value Date   TSH 1.500 11/18/2022   T4TOTAL 6.6 05/21/2018   IMPRESSION AND PLAN:  #1 acute gastroenteritis superimposed on IBS with diarrhea. She is mildly improved with a short course of Cipro. Will collect stool sample for bacterial culture, Giardia/crypto, and C. difficile PCR. CBC with differential and c-Met today.  2.  Labile hypertension. Permissive hypertension has been our approach due to multiple medication intolerances and tendency to have unpredictable low blood pressure episodes. Today her blood pressure is actually pretty good and she has not taken any blood pressure medications today. Continue carvedilol 3.125 mg twice daily, clonidine 0.1 mg nightly, felodipine 10 mg daily, valsartan 160 mg daily, and hydralazine 25 mg 3 times a day. Electrolytes and creatinine monitoring today.  An After Visit Summary was printed and given to the patient.  FOLLOW UP: Return in about 3 months (around 07/21/2023) for routine chronic illness f/u.  Signed:  Santiago Bumpers, MD           04/20/2023

## 2023-04-21 LAB — CBC WITH DIFFERENTIAL/PLATELET
Basophils Absolute: 0 10*3/uL (ref 0.0–0.1)
Basophils Relative: 0.7 % (ref 0.0–3.0)
Eosinophils Absolute: 0.2 10*3/uL (ref 0.0–0.7)
Eosinophils Relative: 2.3 % (ref 0.0–5.0)
HCT: 41 % (ref 36.0–46.0)
Hemoglobin: 13.5 g/dL (ref 12.0–15.0)
Lymphocytes Relative: 24.5 % (ref 12.0–46.0)
Lymphs Abs: 1.7 10*3/uL (ref 0.7–4.0)
MCHC: 32.9 g/dL (ref 30.0–36.0)
MCV: 105.2 fL — ABNORMAL HIGH (ref 78.0–100.0)
Monocytes Absolute: 0.6 10*3/uL (ref 0.1–1.0)
Monocytes Relative: 8.8 % (ref 3.0–12.0)
Neutro Abs: 4.4 10*3/uL (ref 1.4–7.7)
Neutrophils Relative %: 63.7 % (ref 43.0–77.0)
Platelets: 219 10*3/uL (ref 150.0–400.0)
RBC: 3.9 Mil/uL (ref 3.87–5.11)
RDW: 15.2 % (ref 11.5–15.5)
WBC: 6.8 10*3/uL (ref 4.0–10.5)

## 2023-04-21 LAB — COMPREHENSIVE METABOLIC PANEL
ALT: 26 U/L (ref 0–35)
AST: 24 U/L (ref 0–37)
Albumin: 4.8 g/dL (ref 3.5–5.2)
Alkaline Phosphatase: 67 U/L (ref 39–117)
BUN: 22 mg/dL (ref 6–23)
CO2: 26 meq/L (ref 19–32)
Calcium: 9.7 mg/dL (ref 8.4–10.5)
Chloride: 105 meq/L (ref 96–112)
Creatinine, Ser: 0.98 mg/dL (ref 0.40–1.20)
GFR: 54.71 mL/min — ABNORMAL LOW (ref >60.00)
Glucose, Bld: 102 mg/dL — ABNORMAL HIGH (ref 70–99)
Potassium: 4.5 meq/L (ref 3.5–5.1)
Sodium: 141 meq/L (ref 135–145)
Total Bilirubin: 1 mg/dL (ref 0.2–1.2)
Total Protein: 7.4 g/dL (ref 6.0–8.3)

## 2023-04-24 ENCOUNTER — Other Ambulatory Visit: Payer: Medicare Other

## 2023-04-24 DIAGNOSIS — K529 Noninfective gastroenteritis and colitis, unspecified: Secondary | ICD-10-CM | POA: Diagnosis not present

## 2023-04-25 LAB — GIARDIA AND CRYPTOSPORIDIUM ANTIGEN PANEL
MICRO NUMBER:: 15744547
RESULT:: NOT DETECTED
SPECIMEN QUALITY:: ADEQUATE
Specimen Quality:: ADEQUATE
micro Number:: 15744546

## 2023-04-25 LAB — CLOSTRIDIUM DIFFICILE TOXIN B, QUALITATIVE, REAL-TIME PCR: Toxigenic C. Difficile by PCR: NOT DETECTED

## 2023-04-29 LAB — STOOL CULTURE: E coli, Shiga toxin Assay: NEGATIVE

## 2023-05-08 ENCOUNTER — Telehealth: Payer: Self-pay

## 2023-05-08 DIAGNOSIS — K529 Noninfective gastroenteritis and colitis, unspecified: Secondary | ICD-10-CM

## 2023-05-08 NOTE — Telephone Encounter (Signed)
Patient still having intermittent diarrhea, almost everyday.  Patient would like to be referred to GI provider.    Reason for Referral Request:  GI issues  Has patient been seen PCP for this complaint?  Y  No,  please schedule patient for appointment for complaint.  Patient scheduled on:  last visit 11/14  Yes, please find out following information.  Referral for which specialty:  Laurette Schimke  Preferred office/provider:

## 2023-05-08 NOTE — Assessment & Plan Note (Signed)
02/29/2016: Right breast palpable mass (with silicone implants 2620), 3.5 cm on MRI, additional 3 cm anterior linear enhancement (biopsy 03/14/2016 IDC grade 3); 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    T2 N0 stage 2A clinical stage   Treatment summary: 1. Neoadjuvant chemotherapy with dose dense Adriamycin and Cytoxan 4 followed by Abraxane weekly 5  (stopped early due to neuropathy) 2. 09/13/2016: Right lumpectomy: IDC grade 3, 2 foci, 2 cm and 1.1 cm, 0/3 lymph nodes negative margins negative, ER 0%, PR 0%, HER-2 negative ratio 1.02, Ki-67 60%, RCB-II; ypT2ypN0 Stage 2A (with plastic surgery removing the ruptured implant) 3. Followed by radiation therapy completed 12/06/2016 4. Adjuvant Xeloda 1000 mg by mouth twice a day 2 weeks on one week off 12/30/2016- Oct 2018 5. SWOG 1418 clinical trial Pembrolizumab cycle 1 given 05/03/2017 (patient decided to discontinue clinical trial) ----------------------------------------------------------------------------------------------------------------------------------------- Surveillance: 1.  Breast exam 10/29/2021: Benign, no palpable lumps or nodules of concern. 2. mammogram 06/08/2021 at [facility]: No evidence of malignancy.  Breast density category A     Chronic fatigue Chemo-induced peripheral neuropathy: Patient rates it as 5 out of 10 Hospitalization 10/05/2021-10/13/2021: Fracture of the proximal end of left femur: Total hip arthroplasty on 10/06/2021  History of syncope and collapse: No further episodes.  She does feel occasionally dizzy.  She has multiple animals at home including goats and chickens etc. she stays busy taking care of them as well as working a full-time job.  Return to clinic in 1 year

## 2023-05-09 ENCOUNTER — Inpatient Hospital Stay: Payer: Medicare Other | Attending: Hematology and Oncology | Admitting: Hematology and Oncology

## 2023-05-09 DIAGNOSIS — L989 Disorder of the skin and subcutaneous tissue, unspecified: Secondary | ICD-10-CM | POA: Insufficient documentation

## 2023-05-09 DIAGNOSIS — Z9221 Personal history of antineoplastic chemotherapy: Secondary | ICD-10-CM | POA: Insufficient documentation

## 2023-05-09 DIAGNOSIS — Z171 Estrogen receptor negative status [ER-]: Secondary | ICD-10-CM

## 2023-05-09 DIAGNOSIS — R5382 Chronic fatigue, unspecified: Secondary | ICD-10-CM | POA: Insufficient documentation

## 2023-05-09 DIAGNOSIS — Z853 Personal history of malignant neoplasm of breast: Secondary | ICD-10-CM | POA: Insufficient documentation

## 2023-05-09 DIAGNOSIS — Z006 Encounter for examination for normal comparison and control in clinical research program: Secondary | ICD-10-CM | POA: Insufficient documentation

## 2023-05-09 NOTE — Telephone Encounter (Signed)
OK, pls refer to [facility], dx chronic diarrhea.-thx

## 2023-05-09 NOTE — Telephone Encounter (Signed)
Referral placed.

## 2023-05-18 ENCOUNTER — Telehealth: Payer: Self-pay | Admitting: *Deleted

## 2023-05-18 NOTE — Telephone Encounter (Signed)
[id number]- Patient missed appt with Dr. Pamelia Hoit on 05/09/23.  Called patient to see if she is willing to get rescheduled and see MD by 05/30/23 for research required follow up visit.  Patient states she forgot the appointment but is willing to be rescheduled.  Scheduling request sent to r/s pt for follow up visit with Dr. Pamelia Hoit.  [name], BSN, RN, [employer] Research Nurse II [phone] 05/18/2023

## 2023-05-18 NOTE — Telephone Encounter (Signed)
Late Entry: M0102; This research RN called patient and left VM on 05/09/23 after patient did not show for her scheduled visit with Dr. Pamelia Hoit.  Requested patient return call to reschedule appointment.  Scheduling request was sent to r/s patient for this appt by 05/30/23.  [name], BSN, RN, [employer] fundraiser II [phone]

## 2023-05-22 ENCOUNTER — Telehealth: Payer: Self-pay | Admitting: *Deleted

## 2023-05-22 NOTE — Telephone Encounter (Signed)
[id number]- Informed patient of appt with Dr. Pamelia Hoit is r/s to next week on Monday 12/23 at 2:30 pm.  She confirmed. Asked patient to call if she cannot make appt and she verbalized understanding.  [name], BSN, RN, [employer] Clinical Research Nurse II [phone] 05/22/2023 2:07 PM

## 2023-05-29 ENCOUNTER — Inpatient Hospital Stay: Payer: Medicare Other | Admitting: Hematology and Oncology

## 2023-05-29 ENCOUNTER — Encounter: Payer: Self-pay | Admitting: *Deleted

## 2023-05-29 VITALS — BP 143/49 | HR 68 | Temp 97.4°F | Resp 18 | Ht 68.0 in | Wt 168.4 lb

## 2023-05-29 DIAGNOSIS — C50211 Malignant neoplasm of upper-inner quadrant of right female breast: Secondary | ICD-10-CM

## 2023-05-29 DIAGNOSIS — Z171 Estrogen receptor negative status [ER-]: Secondary | ICD-10-CM | POA: Diagnosis not present

## 2023-05-29 DIAGNOSIS — Z9221 Personal history of antineoplastic chemotherapy: Secondary | ICD-10-CM | POA: Diagnosis not present

## 2023-05-29 DIAGNOSIS — L989 Disorder of the skin and subcutaneous tissue, unspecified: Secondary | ICD-10-CM | POA: Diagnosis not present

## 2023-05-29 DIAGNOSIS — Z006 Encounter for examination for normal comparison and control in clinical research program: Secondary | ICD-10-CM | POA: Diagnosis not present

## 2023-05-29 DIAGNOSIS — Z853 Personal history of malignant neoplasm of breast: Secondary | ICD-10-CM | POA: Diagnosis not present

## 2023-05-29 DIAGNOSIS — R5382 Chronic fatigue, unspecified: Secondary | ICD-10-CM | POA: Diagnosis not present

## 2023-05-29 NOTE — Research (Signed)
6 years Follow Up Visit for [phone] Clinical Trial:  Patient into clinic by herself today to see Dr. Pamelia Hoit for yearly follow- up study visit.   H&P, PS, Toxicity and Recurrence assessment;  Completed by Dr. Pamelia Hoit. See MD encounter note from today.  SAEs; Patient denies any serious adverse events since last study visit.  Plan; Her next study visit is due in one year on 05/28/24 (+/- 4 weeks) and will be scheduled.  Patient was thanked for her ongoing participation in this study.  Asked patient to call research nurse if any questions or problems prior to next study visit.  She verbalized understanding.   [name], BSN, RN, [employer] Clinical Research Nurse II [phone] 05/29/2023 3:23 PM

## 2023-05-29 NOTE — Assessment & Plan Note (Signed)
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    T2 N0 stage 2A clinical stage   Treatment summary: 1. Neoadjuvant chemotherapy with dose dense Adriamycin and Cytoxan 4 followed by Abraxane weekly 5  (stopped early due to neuropathy) 2. 09/13/2016: Right lumpectomy: IDC grade 3, 2 foci, 2 cm and 1.1 cm, 0/3 lymph nodes negative margins negative, ER 0%, PR 0%, HER-2 negative ratio 1.02, Ki-67 60%, RCB-II; ypT2ypN0 Stage 2A (with plastic surgery removing the ruptured implant) 3. Followed by radiation therapy completed 12/06/2016 4. Adjuvant Xeloda 1000 mg by mouth twice a day 2 weeks on one week off 12/30/2016- Oct 2018 5. SWOG 1418 clinical trial Pembrolizumab cycle 1 given 05/03/2017 (patient decided to discontinue clinical trial) ----------------------------------------------------------------------------------------------------------------------------------------- Surveillance: 1.  Breast exam 05/29/2023: Benign, no palpable lumps or nodules of concern. 2. mammogram [facility]/17/2022 [facility]: No evidence of malignancy.  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  She does feel occasionally dizzy.   She has multiple animals at home including goats and chickens etc. she stays busy taking care of them as well as working a full-time job.   Return to clinic in 1 year

## 2023-05-29 NOTE — Progress Notes (Signed)
Patient Care Team: Jeoffrey Massed, MD as PCP - General (Family Medicine) Ovidio Kin, MD as Consulting Physician (General Surgery) Serena Croissant, MD as Consulting Physician (Hematology and Oncology) Dorothy Puffer, MD as Consulting Physician (Radiation Oncology) Glenna Fellows, MD as Consulting Physician (Plastic Surgery) Causey, Larna Daughters, NP as Nurse Practitioner (Hematology and Oncology) Genia Del, MD as Consulting Physician (Obstetrics and Gynecology) Tsamis, Georganna Skeans, MD as Referring Physician (Ophthalmology) [facility], Konrad Dolores, MD as Consulting Physician (Endocrinology)  DIAGNOSIS:  Encounter Diagnosis  Name Primary?   Malignant neoplasm of upper-inner quadrant of right breast in female, estrogen receptor negative (HCC) Yes    SUMMARY OF ONCOLOGIC HISTORY: Oncology History  Breast cancer of upper-inner quadrant of right female breast (HCC)  02/29/2016 Initial Diagnosis   Right breast palpable mass (with silicone implants 1982), 3.5 cm on MRI, additional 3 cm anterior linear enhancement? 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 Ki-67 60%, T2 N0 stage 2A clinical stage   03/14/2016 Procedure   Right breast biopsy upper inner quadrant: IDC grade 3   03/28/2016 -  Neo-Adjuvant Chemotherapy   Neoadjuvant chemotherapy with dose dense Adriamycin and Cytoxan followed by Abraxane weekly 5 ( patient is diabetic and cannot take steroids)   07/15/2016 Breast MRI   Right breast: Spiculated mass 1.5 cm significantly smaller compared to prior,NME previously seen is not noted, no abnormal lymph nodes   09/13/2016 Surgery   Removal of the silicone implant due to intracapsular rupture and capsulectomy (Dr.Thimappa)   09/13/2016 Surgery   Right lumpectomy: IDC grade 3, 2 foci, 2 cm and 1.1 cm, 0/3 lymph nodes negative margins negative, ER 0%, PR 0%, HER-2 negative ratio 1.02, Ki-67 60%, RCB-II; ypT2ypN0 Stage 2A    10/20/2016 - 12/06/2016 Radiation  Therapy   Adjuvant radiation therapy   12/30/2016 - 04/05/2017 Chemotherapy   Xeloda 1000 mg by mouth twice a day adjuvant therapy x 4 cycles    05/03/2017 - 05/24/2017 Chemotherapy   SWOG S 1418 Pembrolizumab on clinical trial stopped after 2 doses by patient preference (not due to toxicities .)     CHIEF COMPLIANT: Surveillance of breast cancer  HISTORY OF PRESENT ILLNESS:  History of Present Illness   The patient, who recently turned 89, presents for a routine yearly visit. She reports feeling well overall, with no new symptoms or concerns. She mentions some forgetfulness, which she attributes to discontinuing duloxetine without tapering. She has since resumed the medication and believes it may have caused some side effects. The patient is active, recently riding a horse for the first time in 35 years. She also mentions a skin lesion that she has scratched off multiple times, but it keeps returning. The patient has a history of breast cancer and has a breast implant, which sometimes bothers her. She also has a family history of mental health issues, with a great-granddaughter currently studying mental health nursing.         ALLERGIES:  is allergic to other, prednisone, amlodipine, hydrochlorothiazide, and penicillins.  MEDICATIONS:  Current Outpatient Medications  Medication Sig Dispense Refill   carvedilol (COREG) 3.125 MG tablet Take 1 tablet (3.125 mg total) by mouth 2 (two) times daily with a meal. 180 tablet 1   cholecalciferol (VITAMIN D3) 25 MCG (1000 UNIT) tablet Take 1,000 Units by mouth daily.     cloNIDine (CATAPRES) 0.1 MG tablet 1 tab po qhs 90 tablet 1   DULoxetine (CYMBALTA) 60 MG capsule Take 1 capsule (60 mg total) by mouth daily.  90 capsule 1   felodipine (PLENDIL) 10 MG 24 hr tablet Take 1 tablet (10 mg total) by mouth daily. 90 tablet 1   folic acid (FOLVITE) 1 MG tablet Take 1 tablet (1 mg total) by mouth daily.     glimepiride (AMARYL) 1 MG tablet Take 1  tablet (1 mg total) by mouth daily with breakfast. 90 tablet 3   hydrALAZINE (APRESOLINE) 25 MG tablet Take 1 tablet (25 mg total) by mouth 3 (three) times daily. 270 tablet 1   Multiple Vitamin (MULTIVITAMIN WITH MINERALS) TABS tablet Take 1 tablet by mouth daily.     Propylene Glycol (SYSTANE BALANCE OP) Place 1 drop into both eyes daily as needed (for dry eyes).     SYNTHROID 112 MCG tablet Take 1 tablet (112 mcg total) by mouth daily before breakfast. 90 tablet 3   thiamine 100 MG tablet Take 1 tablet (100 mg total) by mouth daily.     traZODone (DESYREL) 50 MG tablet Take 1 tablet (50 mg total) by mouth at bedtime. 90 tablet 1   valsartan (DIOVAN) 160 MG tablet Take 1 tablet (160 mg total) by mouth daily. 90 tablet 1   No current facility-administered medications for this visit.    PHYSICAL EXAMINATION: ECOG PERFORMANCE STATUS: 1 - Symptomatic but completely ambulatory  Vitals:   05/29/23 1444  BP: (!) 143/49  Pulse: 68  Resp: 18  Temp: (!) 97.4 F (36.3 C)  SpO2: 100%   Filed Weights   05/29/23 1444  Weight: 168 lb 6.4 oz (76.4 kg)    Physical Exam   SKIN: Large, rough spot on skin, resembling a scab, with underlying erythema. Actinic keratosis present, described as red.      (exam performed in the presence of a chaperone)  LABORATORY DATA:  I have reviewed the data as listed    Latest Ref Rng & Units 04/20/2023    2:20 PM 09/27/2022   11:57 AM 05/02/2022   11:28 AM  CMP  Glucose 70 - 99 mg/dL 284  132  440   BUN 6 - 23 mg/dL 22  17  24    Creatinine 0.40 - 1.20 mg/dL 1.02  7.25  3.66   Sodium 135 - 145 mEq/L 141  138  135   Potassium 3.5 - 5.1 mEq/L 4.5  4.6  5.3   Chloride 96 - 112 mEq/L 105  100  99   CO2 19 - 32 mEq/L 26  27  28    Calcium 8.4 - 10.5 mg/dL 9.7  9.8  44.0   Total Protein 6.0 - 8.3 g/dL 7.4   7.4   Total Bilirubin 0.2 - 1.2 mg/dL 1.0   1.8   Alkaline Phos 39 - 117 U/L 67   70   AST 0 - 37 U/L 24   22   ALT 0 - 35 U/L 26   29     Lab  Results  Component Value Date   WBC 6.8 04/20/2023   HGB 13.5 04/20/2023   HCT 41.0 04/20/2023   MCV 105.2 (H) 04/20/2023   PLT 219.0 04/20/2023   NEUTROABS 4.4 04/20/2023    ASSESSMENT & PLAN:  Breast cancer of upper-inner quadrant of right female breast (HCC) 02/29/2016: Right breast palpable mass (with silicone implants 1982), 3.5 cm on MRI, additional 3 cm anterior linear enhancement (biopsy 03/14/2016 IDC grade 3); 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    T2 N0 stage 2A clinical stage   Treatment summary: 1. Neoadjuvant chemotherapy  with dose dense Adriamycin and Cytoxan 4 followed by Abraxane weekly 5  (stopped early due to neuropathy) 2. 09/13/2016: Right lumpectomy: IDC grade 3, 2 foci, 2 cm and 1.1 cm, 0/3 lymph nodes negative margins negative, ER 0%, PR 0%, HER-2 negative ratio 1.02, Ki-67 60%, RCB-II; ypT2ypN0 Stage 2A (with plastic surgery removing the ruptured implant) 3. Followed by radiation therapy completed 12/06/2016 4. Adjuvant Xeloda 1000 mg by mouth twice a day 2 weeks on one week off 12/30/2016- Oct 2018 5. SWOG 1418 clinical trial Pembrolizumab cycle 1 given 05/03/2017 (patient decided to discontinue clinical trial) ----------------------------------------------------------------------------------------------------------------------------------------- Surveillance: 1.  Breast exam 05/29/2023: Benign, no palpable lumps or nodules of concern. 2. mammogram 06/17/2022 at [facility]: No evidence of malignancy.  Breast density category A     Chronic fatigue Chemo-induced peripheral neuropathy: Patient rates it as 5 out of 10 Hospitalization 10/05/2021-10/13/2021: Fracture of the proximal end of left femur: Total hip arthroplasty on 10/06/2021   She has multiple animals at home. she stays busy taking care of them as well as working a full-time job.     Breast Implants No current issues or concerns. -Continue annual check-ins.  Skin Lesion Noted a persistent scab on  the skin. No signs of malignancy. -Consider dermatology referral for further evaluation if it does not resolve or changes.  Neuropathy Patient is taking PEA supplement with some perceived benefit. Discussed recent study results showing no significant benefit for chemotherapy-induced neuropathy, but no contraindications or interactions noted. -Continue PEA supplement as tolerated if perceived benefit.  General Health Maintenance -Continue annual mammograms. Last mammogram results were satisfactory.      Return to clinic in 1 year for follow-up    No orders of the defined types were placed in this encounter.  The patient has a good understanding of the overall plan. she agrees with it. she will call with any problems that may develop before the next visit here. Total time spent: 30 mins including face to face time and time spent for planning, charting and co-ordination of care   Tamsen Meek, MD 05/29/23

## 2023-06-21 ENCOUNTER — Telehealth: Payer: Self-pay | Admitting: Hematology and Oncology

## 2023-06-21 NOTE — Telephone Encounter (Signed)
 Per in basket message patient was scheduled Mid December with Dr. Lee Public 1115.

## 2023-06-29 ENCOUNTER — Other Ambulatory Visit: Payer: Self-pay | Admitting: Family Medicine

## 2023-06-30 DIAGNOSIS — Z1231 Encounter for screening mammogram for malignant neoplasm of breast: Secondary | ICD-10-CM | POA: Diagnosis not present

## 2023-08-04 ENCOUNTER — Encounter: Payer: Self-pay | Admitting: Hematology and Oncology

## 2023-08-10 ENCOUNTER — Telehealth: Payer: Self-pay | Admitting: Internal Medicine

## 2023-08-10 NOTE — Telephone Encounter (Signed)
 MEDICATION:  Synthroid SYNTHROID 112 MCG tablet  PHARMACY:    [facility] - [place], [place] - [address] (Ph: [phone])    HAS THE PATIENT CONTACTED THEIR PHARMACY?  Yes  IS THIS A 90 DAY SUPPLY : Yes  IS PATIENT OUT OF MEDICATION: No  IF NOT; HOW MUCH IS LEFT: 2-3 days  LAST APPOINTMENT DATE: @6 /14/2024  NEXT APPOINTMENT DATE:@Visit  date not found  DO WE HAVE YOUR PERMISSION TO LEAVE A DETAILED MESSAGE?:Yes  OTHER COMMENTS: Patient states that reason for name brand product needs to be included as she cannot take the generic.   **Let patient know to contact pharmacy at the end of the day to make sure medication is ready. **  ** Please notify patient to allow 48-72 hours to process**  **Encourage patient to contact the pharmacy for refills or they can request refills through [facility]**

## 2023-08-14 MED ORDER — SYNTHROID 112 MCG PO TABS: 112.0000 ug | ORAL_TABLET | Freq: Every day | ORAL | 0 refills | Status: DC

## 2023-08-14 NOTE — Telephone Encounter (Signed)
 [id number] to call office to schedule follow up appointment with Dr., Lonzo Cloud.

## 2023-08-14 NOTE — Addendum Note (Signed)
 Addended by: Lisabeth Pick on: 08/14/2023 10:18 AM   Modules accepted: Orders

## 2023-08-14 NOTE — Telephone Encounter (Signed)
 Appointment scheduled for 03.13.25 @ 10:30 AM

## 2023-08-14 NOTE — Telephone Encounter (Signed)
 Done

## 2023-08-15 ENCOUNTER — Other Ambulatory Visit: Payer: Self-pay | Admitting: Family Medicine

## 2023-08-15 MED ORDER — DULOXETINE HCL 60 MG PO CPEP: 60.0000 mg | ORAL_CAPSULE | Freq: Every day | ORAL | 0 refills | Status: DC

## 2023-08-17 ENCOUNTER — Ambulatory Visit: Admitting: Internal Medicine

## 2023-08-17 ENCOUNTER — Encounter: Payer: Self-pay | Admitting: Internal Medicine

## 2023-08-17 VITALS — BP 128/74 | HR 61 | Ht 68.0 in | Wt 164.0 lb

## 2023-08-17 DIAGNOSIS — E1142 Type 2 diabetes mellitus with diabetic polyneuropathy: Secondary | ICD-10-CM

## 2023-08-17 DIAGNOSIS — Z7984 Long term (current) use of oral hypoglycemic drugs: Secondary | ICD-10-CM | POA: Diagnosis not present

## 2023-08-17 DIAGNOSIS — E039 Hypothyroidism, unspecified: Secondary | ICD-10-CM

## 2023-08-17 DIAGNOSIS — E785 Hyperlipidemia, unspecified: Secondary | ICD-10-CM | POA: Diagnosis not present

## 2023-08-17 LAB — POCT GLYCOSYLATED HEMOGLOBIN (HGB A1C): Hemoglobin A1C: 6.5 % — AB (ref 4.0–5.6)

## 2023-08-17 LAB — POCT GLUCOSE (DEVICE FOR HOME USE): Glucose Fasting, POC: 168 mg/dL — AB (ref 70–99)

## 2023-08-17 MED ORDER — GLIMEPIRIDE 1 MG PO TABS: 1.0000 mg | ORAL_TABLET | Freq: Every day | ORAL | 3 refills | Status: AC

## 2023-08-17 NOTE — Progress Notes (Unsigned)
 Name: Marissa Hall  MRN/ DOB: [id number], 05-12-1941   Age/ Sex: 83 y.o., female    PCP: Jeoffrey Massed, MD   Reason for Endocrinology Evaluation: Type 2 Diabetes Mellitus     Date of Initial Endocrinology Visit: 07/20/2021    PATIENT IDENTIFIER: Marissa Hall is a 83 y.o. female with a past medical history of T2DM, Dyslipidemia, Hx of breast Ca, and Hypothyroidism. The patient presented for initial endocrinology clinic visit on 08/17/2023 for consultative assistance with her diabetes management.    HPI: Marissa Hall is transferring care from [facility] Dr. Shawnee Knapp    Diagnosed with DM ~ 2005 Prior Medications tried/Intolerance: just metformin            Hemoglobin A1c has ranged from 7.0% in 2019, peaking at 7.4% in 2021.  On her initial visit to our clinic she had an A1c of 7.1%  Attempted to prescribe Jardiance 11/2022 but this was cost prohibitive and glimepiride was initiated instead  Metformin discontinued due to diarrhea 2024  She was on levothyroxine in the past but her TFT's have were not controlled   SUBJECTIVE:   During the last visit (11/18/2022): A1c 7.2%   Today (08/17/23): Marissa Hall is here for follow-up on diabetes management and hypothyroidism. She has not been checking glucose in 2 months.   She continues to follow-up with oncology for Hx of breast CA diagnosed in 2017  Denies nausea, or vomiting  Continues with occasional diarrhea but its better than in the past  Denies local neck swelling  Denies palpitations   HOME DIABETES REGIMEN: Metformin 500 mg XR , BID - not taking  Glimepiride 1 mg daily Synthroid 112 mcg daily     Statin: yes ACE-I/ARB: yes    METER DOWNLOAD SUMMARY:  Range 150-180 mg/dl     DIABETIC COMPLICATIONS: Microvascular complications:  Neuropathy Denies: CKD, retinopathy Last eye exam: Completed 09/27/2021  Macrovascular complications:  Nonischemic cardiomyopathy, TIA Denies: CAD, PVD, CVA   PAST  HISTORY: Past Medical History:  Past Medical History:  Diagnosis Date   Acute DVT (deep venous thrombosis) (HCC) 04/23/2019   Right popliteal   Alcoholism (HCC)    Anxiety and depression 1964   oncologist started duloxetine 12/2016   Breast cancer (HCC) 03/14/2016   Clinical stage 2A: (triple neg): Right breast, upper inner quadrant, 03/2016.  Neoadjuvant chemo x 5 cycles,lumpectomy 4 mo later, then RT started 10/2016.  Adjuvant Xeloda [id number].  SWOG research trial pt 04/2017--pt randomized to pembrolizumab immunotherapy.  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  A1c Mar 12, 2018 was 7.0% at Dr. Lacie Draft.    Epidermoid cyst of vulva    Chronic epidermoid cyst of the vulva.  Excision done 02/2019   GERD (gastroesophageal reflux disease) 2013   History of basal cell cancer    L ankle   Hyperlipidemia 1986   Hypertension 2008   2022 ->addition of hctz to lisinopril led to 25ml drop in GFR. HCTZ d/cd.   Hypothyroidism 1988   Diagnosed in her 83s.  Managed by Endocrinologist   Lumbar radiculopathy 04/2019   Dr. Katrinka Blazing to get plain films of  LB and hip (considering MRI due to her hx of cancer)   Nonischemic cardiomyopathy (HCC)    Hx of takotsubo CM   Osteoporosis 2015   pt states "osteopenia", but then says that she refused to take the rx med for this condition, so I suspect she had osteoporosis.   Peripheral neuropathy 2017   Patient states diabetic neuropathy in feet prior to starting chemotherapy and then worsened by chemo.    SCC (squamous cell carcinoma), arm, right    Syncope and collapse    05/2021 while in [place].   No prodrome.  Monitor ordered and cardiology referral ordered 05/14/21   TIA (transient ischemic attack) 03/26/2011   2012: question of (HA + R eye "floaters"). CT in ED neg acute. Not admitted, no f/u testing done.  ?ocular migraine?   Past Surgical History:  Past Surgical History:  Procedure Laterality Date   ABDOMINAL HYSTERECTOMY  1972   APPENDECTOMY  1972   BREAST ENHANCEMENT SURGERY  1982   BREAST IMPLANT REMOVAL Right 09/13/2016   Procedure: REMOVAL RIGHT BREAST IMPLANT;  Surgeon: Glenna Fellows, MD;  Location: [facility];  Service: Plastics;  Laterality: Right;   BREAST LUMPECTOMY WITH RADIOACTIVE SEED AND SENTINEL LYMPH NODE BIOPSY Right 09/13/2016   Procedure: RIGHT BREAST LUMPECTOMY WITH RADIOACTIVE SEED X 2 AND SENTINEL LYMPH NODE BIOPSY;  Surgeon: Ovidio Kin, MD;  Location: [facility];  Service: General;  Laterality: Right;   BREAST SURGERY Right 03/14/2016   Biopsy   CAPSULECTOMY Right 09/13/2016   Procedure: RIGHT CAPSULECTOMY;  Surgeon: Glenna Fellows, MD;  Location: [facility];  Service: Plastics;  Laterality: Right;   CATARACT EXTRACTION, BILATERAL Bilateral 08/10/17 right eye, 08/31/17 left eye   MASS EXCISION Left 02/28/2018   Path: benign.  Procedure: EXCISIONLEFT MEDIAL THIGH MASS ERAS PATHWAY;  Surgeon: Harriette Bouillon, MD;  Location: [facility];  Service: General;  Laterality: Left;   PORTACATH PLACEMENT N/A 03/15/2016   Procedure: INSERTION PORT-A-CATH WITH [place];  Surgeon: Ovidio Kin, MD;  Location: [facility];  Service: General;  Laterality: N/A;   PORTACATH REMOVAL  07/2017   SLEEP STUDY  09/2021   NO SLEEP APNEA   surgical repair left ankle Left 2009   s/p Fall    TONSILLECTOMY AND ADENOIDECTOMY  1948   Age 38   TOTAL HIP ARTHROPLASTY Left 10/06/2021   Procedure: TOTAL HIP ARTHROPLASTY ANTERIOR APPROACH;  Surgeon: Eldred Manges, MD;  Location: [facility];  Service: Orthopedics;  Laterality: Left;   US  CAROTID DOPPLER BILATERAL ([facility] HX)  01/2021   <50% bilat int carotid sten, otherwise normal.   Zio monitoring     06/2021; sinus, some mild brady, brief SVT, no signif PVCs    Social History:  reports that she quit smoking about 24 years ago. Her smoking use included cigarettes. She has never used smokeless tobacco. She reports current alcohol use of about 7.0 standard drinks of alcohol per week. She reports that she does not use drugs. Family History:  Family History  Problem Relation Age of Onset   Stroke Mother    Suicidality Father    Stroke Brother    Stroke Son    Sleep apnea Son      HOME MEDICATIONS: Allergies as of 08/17/2023       Reactions   Other Other (See Comments)   STEROIDS- emotional   Prednisone Other (See Comments)   Other reaction(s): Mental Status Changes (intolerance)   Amlodipine Other (See  Comments)   Elevated creatinine   Hydrochlorothiazide Other (See Comments)   Elevated serum creatinine   Penicillins Other (See Comments)   Unsure of reaction, was 83 years old        Medication List        Accurate as of August 17, 2023 10:59 AM. If you have any questions, ask your nurse or doctor.          STOP taking these medications    cloNIDine 0.1 MG tablet Commonly known as: CATAPRES Stopped by: Johnney Ou Abdulhamid Olgin   hydrALAZINE 25 MG tablet Commonly known as: APRESOLINE Stopped by: Johnney Ou Kale Dols       TAKE these medications    carvedilol 3.125 MG tablet Commonly known as: COREG Take 1 tablet (3.125 mg total) by mouth 2 (two) times daily with a meal.   cholecalciferol 25 MCG (1000 UNIT) tablet Commonly known as: VITAMIN D3 Take 1,000 Units by mouth daily.   DULoxetine 60 MG capsule Commonly known as: CYMBALTA Take 1 capsule (60 mg total) by mouth daily.   felodipine 10 MG 24 hr tablet Commonly known as: PLENDIL Take 1 tablet (10 mg total) by mouth daily.   folic acid 1 MG tablet Commonly known as: FOLVITE Take 1  tablet (1 mg total) by mouth daily.   glimepiride 1 MG tablet Commonly known as: AMARYL Take 1 tablet (1 mg total) by mouth daily with breakfast.   lisinopril 20 MG tablet Commonly known as: ZESTRIL Take by mouth.   multivitamin with minerals Tabs tablet Take 1 tablet by mouth daily.   Synthroid 112 MCG tablet Generic drug: levothyroxine Take 1 tablet (112 mcg total) by mouth daily before breakfast.   SYSTANE BALANCE OP Place 1 drop into both eyes daily as needed (for dry eyes).   thiamine 100 MG tablet Commonly known as: VITAMIN B1 Take 1 tablet (100 mg total) by mouth daily.   traZODone 50 MG tablet Commonly known as: DESYREL Take 1 tablet (50 mg total) by mouth at bedtime.   valsartan 160 MG tablet Commonly known as: DIOVAN Take 1 tablet (160 mg total) by mouth daily.         ALLERGIES: Allergies  Allergen Reactions   Other Other (See Comments)    STEROIDS- emotional   Prednisone Other (See Comments)    Other reaction(s): Mental Status Changes (intolerance)   Amlodipine Other (See Comments)    Elevated creatinine   Hydrochlorothiazide Other (See Comments)    Elevated serum creatinine   Penicillins Other (See Comments)    Unsure of reaction, was 83 years old     REVIEW OF SYSTEMS: A comprehensive ROS was conducted with the patient and is negative except as per HPI    OBJECTIVE:   VITAL SIGNS: BP 128/74 (BP Location: Left Arm, Patient Position: Sitting, Cuff Size: Small)   Pulse 61   Ht 5\' 8"  (1.727 m)   Wt 164 lb (74.4 kg)   SpO2 98%   BMI 24.94 kg/m    PHYSICAL EXAM:  General: Pt appears well and is in NAD  Neck: General: Supple without adenopathy or carotid bruits. Thyroid: Thyroid size normal.  No goiter or nodules appreciated.  Lungs: Clear with good BS bilat with no rales, rhonchi, or wheezes  Heart: RRR   Extremities:  Lower extremities - No pretibial edema.   Neuro: MS is good with appropriate affect, pt is alert and Ox3    DM foot  exam: 08/17/2023  The skin of the  feet is intact without sores or ulcerations. The pedal pulses are 2+ on right and 2+ on left. The sensation is decreased  to a screening 5.07, 10 gram monofilament on the right    DATA REVIEWED:  Lab Results  Component Value Date   HGBA1C 6.5 (A) 08/17/2023   HGBA1C 7.2 (A) 11/18/2022   HGBA1C 7.0 (A) 05/18/2022     Latest Reference Range & Units 08/17/23 11:14  TSH 0.40 - 4.50 mIU/L 4.17  T4,Free(Direct) 0.8 - 1.8 ng/dL 1.1      Latest Reference Range & Units 04/20/23 14:20  Sodium 135 - 145 mEq/L 141  Potassium 3.5 - 5.1 mEq/L 4.5  Chloride 96 - 112 mEq/L 105  CO2 19 - 32 mEq/L 26  Glucose 70 - 99 mg/dL 027 (H)  BUN 6 - 23 mg/dL 22  Creatinine 2.53 - 6.64 mg/dL 4.03  Calcium 8.4 - 47.4 mg/dL 9.7  Alkaline Phosphatase 39 - 117 U/L 67  Albumin 3.5 - 5.2 g/dL 4.8  AST 0 - 37 U/L 24  ALT 0 - 35 U/L 26  Total Protein 6.0 - 8.3 g/dL 7.4  Total Bilirubin 0.2 - 1.2 mg/dL 1.0  GFR >25.95 mL/min 54.71 (L)    Latest Reference Range & Units 08/17/23 11:14  Microalb, Ur mg/dL 0.5  MICROALB/CREAT RATIO <30 mg/g creat 7  Creatinine, Urine 20 - 275 mg/dL 72    In-office BG 638 mg/dL    ASSESSMENT / PLAN / RECOMMENDATIONS:   1) Type 2 Diabetes Mellitus, Optimally controlled, With neuropathic and Macrovascular complications - Most recent A1c of 6.5 %. Goal A1c < 7.5 %.    -A1c optimal  -Intolerant to metformin due to diarrhea -SGLT2 inhibitors are cost prohibitive -No change   MEDICATIONS: Continue glimepiride 1 mg daily  EDUCATION / INSTRUCTIONS: BG monitoring instructions: Patient is instructed to check her blood sugars 1 times a day, fasting . Call [facility] if: BG persistently < 70  I reviewed the Rule of 15 for the treatment of hypoglycemia in detail with the patient. Literature supplied.   2) Diabetic complications:  Eye: Does not have known diabetic retinopathy.  Neuro/ Feet: Does  have known diabetic  peripheral neuropathy. Renal: Patient does not have known baseline CKD. She is  on an ACEI/ARB at present.     3) Hypothyroidism :  - TSH continues to be within normal range   Medication   Continue Synthroid 112 mcg daily  4) Dyslipidemia:  -LDL at goal -She does admit to imperfect adherence to statin intake   Medication  Continue atorvastatin 80 mg daily  F/U in 6 months   Signed electronically by: Lyndle Herrlich, MD  [facility] End[facility]  [facility] [address] [place] [place], [place] [address] Phone: [phone] FAX: [phone]   CC: Jeoffrey Massed, MD [address] [place] [address] Phone: [phone]  Fax: [phone]    Return to Endocrinology clinic as below: Future Appointments  Date Time Provider Department Center  08/21/2023  8:40 AM Milinda Cave, Maryjean Morn, MD [facility]  09/20/2023 10:10 AM [facility] ANNUAL WELLNESS VISIT [facility]  05/21/2024 11:15 AM Serena Croissant, MD [facility] None

## 2023-08-17 NOTE — Patient Instructions (Signed)
 Continue Glimepiride 1 mg daily    HOW TO TREAT LOW BLOOD SUGARS (Blood sugar LESS THAN 70 MG/DL) Please follow the RULE OF 15 for the treatment of hypoglycemia treatment (when your (blood sugars are less than 70 mg/dL)   STEP 1: Take 15 grams of carbohydrates when your blood sugar is low, which includes:  3-4 GLUCOSE TABS  OR 3-4 OZ OF JUICE OR REGULAR SODA OR ONE TUBE OF GLUCOSE GEL    STEP 2: RECHECK blood sugar in 15 MINUTES STEP 3: If your blood sugar is still low at the 15 minute recheck --> then, go back to STEP 1 and treat AGAIN with another 15 grams of carbohydrates.

## 2023-08-18 ENCOUNTER — Encounter: Payer: Self-pay | Admitting: Internal Medicine

## 2023-08-18 LAB — T4, FREE: Free T4: 1.1 ng/dL (ref 0.8–1.8)

## 2023-08-18 LAB — MICROALBUMIN / CREATININE URINE RATIO
Creatinine, Urine: 72 mg/dL (ref 20–275)
Microalb Creat Ratio: 7 mg/g{creat} (ref <30)
Microalb, Ur: 0.5 mg/dL

## 2023-08-18 LAB — TSH: TSH: 4.17 m[IU]/L (ref 0.40–4.50)

## 2023-08-18 MED ORDER — SYNTHROID 112 MCG PO TABS: 112.0000 ug | ORAL_TABLET | Freq: Every day | ORAL | 3 refills | Status: DC

## 2023-08-20 ENCOUNTER — Other Ambulatory Visit: Payer: Self-pay | Admitting: Family Medicine

## 2023-08-21 ENCOUNTER — Ambulatory Visit: Admitting: Family Medicine

## 2023-08-22 ENCOUNTER — Ambulatory Visit (INDEPENDENT_AMBULATORY_CARE_PROVIDER_SITE_OTHER): Admitting: Family Medicine

## 2023-08-22 VITALS — BP 121/73 | HR 57 | Temp 97.9°F | Ht 68.0 in | Wt 165.6 lb

## 2023-08-22 DIAGNOSIS — R0989 Other specified symptoms and signs involving the circulatory and respiratory systems: Secondary | ICD-10-CM | POA: Diagnosis not present

## 2023-08-22 DIAGNOSIS — F3342 Major depressive disorder, recurrent, in full remission: Secondary | ICD-10-CM | POA: Diagnosis not present

## 2023-08-22 DIAGNOSIS — H40003 Preglaucoma, unspecified, bilateral: Secondary | ICD-10-CM | POA: Diagnosis not present

## 2023-08-22 NOTE — Progress Notes (Signed)
 OFFICE VISIT  08/22/2023  CC:  Chief Complaint  Patient presents with   Medical Management of Chronic Issues    Patient is a 83 y.o. female who presents for follow-up of hypertension and recurrent major depressive disorder. I last saw her about 4 months ago. Labile hypertension. Permissive hypertension has been our approach due to multiple medication intolerances and tendency to have unpredictable low blood pressure episodes. Today her blood pressure is actually pretty good and she has not taken any blood pressure medications today. Continue carvedilol 3.125 mg twice daily, clonidine 0.1 mg nightly, felodipine 10 mg daily, valsartan 160 mg daily, and hydralazine 25 mg 3 times a day. Electrolytes and creatinine monitoring today."  INTERIM HX: She feels well. Mood has been good, anxiety level minimal lately. She is enjoying her chickens and dogs.  Home blood pressures consistently normal. Current blood pressure medication regimen: carvedilol 3.125 mg twice daily,  felodipine 10 mg daily, valsartan 160 mg daily (she is not taking lisinopril currently on her medication list).  ROS --> no fevers, no CP, no SOB, no wheezing, no cough, no dizziness, no HAs, no rashes, no melena/hematochezia.  No polyuria or polydipsia.  No myalgias or arthralgias.  No focal weakness, paresthesias, or tremors.  No acute vision or hearing abnormalities.  No dysuria or unusual/new urinary urgency or frequency.  No recent changes in lower legs. No n/v/d or abd pain.  No palpitations.    Past Medical History:  Diagnosis Date   Acute DVT (deep venous thrombosis) (HCC) 04/23/2019   Right popliteal   Alcoholism (HCC)    Anxiety and depression 1964   oncologist started duloxetine 12/2016   Breast cancer (HCC) 03/14/2016   Clinical stage 2A: (triple neg): Right breast, upper inner quadrant, 03/2016.  Neoadjuvant chemo x 5 cycles,lumpectomy 4 mo later, then RT started 10/2016.  Adjuvant Xeloda [id number].  SWOG  research trial pt 04/2017--pt randomized to pembrolizumab immunotherapy.  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  A1c Mar 12, 2018 was 7.0% at Dr. Lacie Draft.    Epidermoid cyst of vulva    Chronic epidermoid cyst of the vulva.  Excision done 02/2019   GERD (gastroesophageal reflux disease) 2013   History of basal cell cancer    L ankle   Hyperlipidemia 1986   Hypertension 2008   2022 ->addition of hctz to lisinopril led to 25ml drop in GFR. HCTZ d/cd.   Hypothyroidism 1988   Diagnosed in her 6s.  Managed by Endocrinologist   Lumbar radiculopathy 04/2019   Dr. Katrinka Blazing to get plain films of LB and hip (considering MRI due to her hx of cancer)   Nonischemic cardiomyopathy (HCC)    Hx of takotsubo CM   Osteoporosis 2015   pt states "osteopenia", but then says that she refused to take the rx med for this condition, so I suspect she had osteoporosis.   Peripheral neuropathy 2017   Patient states diabetic neuropathy in feet prior to starting chemotherapy and then worsened by chemo.    SCC (squamous cell carcinoma), arm, right  Syncope and collapse    05/2021 while in [place].  No prodrome.  Monitor ordered and cardiology referral ordered 05/14/21   TIA (transient ischemic attack) 03/26/2011   2012: question of (HA + R eye "floaters"). CT in ED neg acute. Not admitted, no f/u testing done.  ?ocular migraine?    Past Surgical History:  Procedure Laterality Date   ABDOMINAL HYSTERECTOMY  1972   APPENDECTOMY  1972   BREAST ENHANCEMENT SURGERY  1982    BREAST IMPLANT REMOVAL Right 09/13/2016   Procedure: REMOVAL RIGHT BREAST IMPLANT;  Surgeon: Glenna Fellows, MD;  Location: [facility];  Service: Plastics;  Laterality: Right;   BREAST LUMPECTOMY WITH RADIOACTIVE SEED AND SENTINEL LYMPH NODE BIOPSY Right 09/13/2016   Procedure: RIGHT BREAST LUMPECTOMY WITH RADIOACTIVE SEED X 2 AND SENTINEL LYMPH NODE BIOPSY;  Surgeon: Ovidio Kin, MD;  Location: [facility];  Service: General;  Laterality: Right;   BREAST SURGERY Right 03/14/2016   Biopsy   CAPSULECTOMY Right 09/13/2016   Procedure: RIGHT CAPSULECTOMY;  Surgeon: Glenna Fellows, MD;  Location: [facility];  Service: Plastics;  Laterality: Right;   CATARACT EXTRACTION, BILATERAL Bilateral 08/10/17 right eye, 08/31/17 left eye   MASS EXCISION Left 02/28/2018   Path: benign.  Procedure: EXCISIONLEFT MEDIAL THIGH MASS ERAS PATHWAY;  Surgeon: Harriette Bouillon, MD;  Location: [facility];  Service: General;  Laterality: Left;   PORTACATH PLACEMENT N/A 03/15/2016   Procedure: INSERTION PORT-A-CATH WITH [place];  Surgeon: Ovidio Kin, MD;  Location[facility];  Service: General;  Laterality: N/A;   PORTACATH REMOVAL  07/2017   SLEEP STUDY  09/2021   NO SLEEP APNEA   surgical repair left ankle Left 2009   s/p Fall    TONSILLECTOMY AND ADENOIDECTOMY  1948   Age 11   TOTAL HIP ARTHROPLASTY Left 10/06/2021   Procedure: TOTAL HIP ARTHROPLASTY ANTERIOR APPROACH;  Surgeon: Eldred Manges, MD;  Location: [facility];  Service: Orthopedics;  Laterality: Left;   US CAROTID DOPPLER BILATERAL ([facility] HX)  01/2021   <50% bilat int carotid sten, otherwise normal.   Zio monitoring     06/2021; sinus, some mild brady, brief SVT, no signif PVCs    Outpatient Medications Prior to Visit  Medication Sig Dispense Refill   carvedilol (COREG) 3.125 MG tablet Take 1 tablet (3.125 mg total) by mouth 2 (two) times daily with a meal. 180 tablet 1   cholecalciferol (VITAMIN D3) 25  MCG (1000 UNIT) tablet Take 1,000 Units by mouth daily.     DULoxetine (CYMBALTA) 60 MG capsule Take 1 capsule (60 mg total) by mouth daily. 30 capsule 0   felodipine (PLENDIL) 10 MG 24 hr tablet Take 1 tablet (10 mg total) by mouth daily. 90 tablet 1   folic acid (FOLVITE) 1 MG tablet Take 1 tablet (1 mg total) by mouth daily.     glimepiride (AMARYL) 1 MG tablet Take 1 tablet (1 mg total) by mouth daily with breakfast. 90 tablet 3   Multiple Vitamin (MULTIVITAMIN WITH MINERALS) TABS tablet Take 1 tablet by mouth daily.     Propylene Glycol (SYSTANE BALANCE OP) Place 1 drop into both eyes daily as needed (for dry eyes).     SYNTHROID 112 MCG tablet Take 1 tablet (112 mcg total) by mouth daily before breakfast. 90 tablet 3   thiamine 100 MG tablet Take 1 tablet (100 mg total) by mouth daily.     traZODone (DESYREL) 50 MG tablet Take 1  tablet (50 mg total) by mouth at bedtime. 90 tablet 1   valsartan (DIOVAN) 160 MG tablet Take 1 tablet (160 mg total) by mouth daily. 90 tablet 1   lisinopril (ZESTRIL) 20 MG tablet Take by mouth.     No facility-administered medications prior to visit.    Allergies  Allergen Reactions   Other Other (See Comments)    STEROIDS- emotional   Prednisone Other (See Comments)    Other reaction(s): Mental Status Changes (intolerance)   Amlodipine Other (See Comments)    Elevated creatinine   Hydrochlorothiazide Other (See Comments)    Elevated serum creatinine   Penicillins Other (See Comments)    Unsure of reaction, was 83 years old    Review of Systems As per HPI  PE:    08/22/2023   11:08 AM 08/17/2023   10:42 AM 05/29/2023    2:44 PM  Vitals with BMI  Height 5\' 8"  5\' 8"  5\' 8"   Weight 165 lbs 10 oz 164 lbs 168 lbs 6 oz  BMI 25.19 24.94 25.61  Systolic 121 128 782  Diastolic 73 74 49  Pulse 57 61 68     Physical Exam  Gen: Alert, well appearing.  Patient is oriented to person, place, time, and situation. AFFECT: pleasant, lucid thought and  speech. No further exam today  LABS:  Last CBC Lab Results  Component Value Date   WBC 6.8 04/20/2023   HGB 13.5 04/20/2023   HCT 41.0 04/20/2023   MCV 105.2 (H) 04/20/2023   MCH 32.9 05/02/2022   RDW 15.2 04/20/2023   PLT 219.0 04/20/2023   Last metabolic panel Lab Results  Component Value Date   GLUCOSE 102 (H) 04/20/2023   NA 141 04/20/2023   K 4.5 04/20/2023   CL 105 04/20/2023   CO2 26 04/20/2023   BUN 22 04/20/2023   CREATININE 0.98 04/20/2023   GFR 54.71 (L) 04/20/2023   CALCIUM 9.7 04/20/2023   PROT 7.4 04/20/2023   ALBUMIN 4.8 04/20/2023   BILITOT 1.0 04/20/2023   ALKPHOS 67 04/20/2023   AST 24 04/20/2023   ALT 26 04/20/2023   ANIONGAP 8 05/02/2022   Last lipids Lab Results  Component Value Date   CHOL 167 11/18/2022   HDL 78 11/18/2022   LDLCALC 60 11/18/2022   LDLDIRECT 218.0 02/23/2021   TRIG 179 (H) 11/18/2022   CHOLHDL 2.1 11/18/2022   Last hemoglobin A1c Lab Results  Component Value Date   HGBA1C 6.5 (A) 08/17/2023   Last thyroid functions Lab Results  Component Value Date   TSH 4.17 08/17/2023   T4TOTAL 6.6 05/21/2018   IMPRESSION AND PLAN:  #1 labile hypertension. Well-controlled lately. Continue carvedilol 3.125 mg twice daily,  felodipine 10 mg daily, valsartan 160 mg daily. I removed lisinopril from her medication list.  #2 recurrent major depressive disorder, in remission. Continue Cymbalta 60 mg/day. An After Visit Summary was printed and given to the patient.  FOLLOW UP: Return in about 4 months (around 12/22/2023) for annual CPE (fasting).  Signed:  Santiago Bumpers, MD           08/22/2023

## 2023-08-23 ENCOUNTER — Telehealth: Payer: Self-pay

## 2023-08-23 NOTE — Telephone Encounter (Signed)
 Synthroid needs PA. Patient is out of medicaiton

## 2023-08-23 NOTE — Telephone Encounter (Signed)
 Pharmacy Patient Advocate Encounter   Received notification from Pt Calls Messages that prior authorization for Synthroid is required/requested.   Insurance verification completed.   The patient is insured through [employer] [facility] .   Per test claim: PA required; PA submitted to above mentioned insurance via CoverMyMeds Key/confirmation #/EOC [id number] Status is pending

## 2023-08-24 MED ORDER — LEVOTHYROXINE SODIUM 112 MCG PO TABS: 112.0000 ug | ORAL_TABLET | Freq: Every day | ORAL | 3 refills | Status: DC

## 2023-08-28 ENCOUNTER — Telehealth: Payer: Self-pay

## 2023-08-28 NOTE — Telephone Encounter (Signed)
 Pharmacy given verbal that it's okay to change to generic

## 2023-08-28 NOTE — Telephone Encounter (Signed)
 [facility] would like to know if okay to fill generic of the Synthroid. Insurance will not pay for brand. Patient is aware and okay with generic per pharmacy.

## 2023-08-30 ENCOUNTER — Ambulatory Visit: Admitting: Family Medicine

## 2023-09-01 NOTE — Telephone Encounter (Signed)
 Unfortunately not. But I see the generic has been sent. If we can get it on recent record that it is not working, we may be able to submit again

## 2023-09-12 ENCOUNTER — Other Ambulatory Visit: Payer: Self-pay | Admitting: Family Medicine

## 2023-09-20 ENCOUNTER — Ambulatory Visit: Payer: Medicare Other | Admitting: *Deleted

## 2023-09-20 DIAGNOSIS — Z780 Asymptomatic menopausal state: Secondary | ICD-10-CM

## 2023-09-20 DIAGNOSIS — Z0000 Encounter for general adult medical examination without abnormal findings: Secondary | ICD-10-CM

## 2023-09-20 NOTE — Patient Instructions (Signed)
 Marissa Hall , Thank you for taking time to come for your Medicare Wellness Visit. I appreciate your ongoing commitment to your health goals. Please review the following plan we discussed and let me know if I can assist you in the future.   Screening recommendations/referrals: Colonoscopy: no longer required Mammogram: Education provided Bone Density: ordered Recommended yearly ophthalmology/optometry visit for glaucoma screening and checkup Recommended yearly dental visit for hygiene and checkup  Vaccinations: Influenza vaccine: up to date Pneumococcal vaccine: up to date Tdap vaccine: up to date Shingles vaccine: Education provided       Preventive Care 65 Years and Older, Female Preventive care refers to lifestyle choices and visits with your health care provider that can promote health and wellness. What does preventive care include? A yearly physical exam. This is also called an annual well check. Dental exams once or twice a year. Routine eye exams. Ask your health care provider how often you should have your eyes checked. Personal lifestyle choices, including: Daily care of your teeth and gums. Regular physical activity. Eating a healthy diet. Avoiding tobacco and drug use. Limiting alcohol use. Practicing safe sex. Taking low-dose aspirin every day. Taking vitamin and mineral supplements as recommended by your health care provider. What happens during an annual well check? The services and screenings done by your health care provider during your annual well check will depend on your age, overall health, lifestyle risk factors, and family history of disease. Counseling  Your health care provider may ask you questions about your: Alcohol use. Tobacco use. Drug use. Emotional well-being. Home and relationship well-being. Sexual activity. Eating habits. History of falls. Memory and ability to understand (cognition). Work and work Astronomer. Reproductive  health. Screening  You may have the following tests or measurements: Height, weight, and BMI. Blood pressure. Lipid and cholesterol levels. These may be checked every 5 years, or more frequently if you are over 47 years old. Skin check. Lung cancer screening. You may have this screening every year starting at age 42 if you have a 30-pack-year history of smoking and currently smoke or have quit within the past 15 years. Fecal occult blood test (FOBT) of the stool. You may have this test every year starting at age 11. Flexible sigmoidoscopy or colonoscopy. You may have a sigmoidoscopy every 5 years or a colonoscopy every 10 years starting at age 75. Hepatitis C blood test. Hepatitis B blood test. Sexually transmitted disease (STD) testing. Diabetes screening. This is done by checking your blood sugar (glucose) after you have not eaten for a while (fasting). You may have this done every 1-3 years. Bone density scan. This is done to screen for osteoporosis. You may have this done starting at age 23. Mammogram. This may be done every 1-2 years. Talk to your health care provider about how often you should have regular mammograms. Talk with your health care provider about your test results, treatment options, and if necessary, the need for more tests. Vaccines  Your health care provider may recommend certain vaccines, such as: Influenza vaccine. This is recommended every year. Tetanus, diphtheria, and acellular pertussis (Tdap, Td) vaccine. You may need a Td booster every 10 years. Zoster vaccine. You may need this after age 54. Pneumococcal 13-valent conjugate (PCV13) vaccine. One dose is recommended after age 56. Pneumococcal polysaccharide (PPSV23) vaccine. One dose is recommended after age 77. Talk to your health care provider about which screenings and vaccines you need and how often you need them. This information is not  intended to replace advice given to you by your health care provider.  Make sure you discuss any questions you have with your health care provider. Document Released: 06/19/2015 Document Revised: 02/10/2016 Document Reviewed: 03/24/2015 Elsevier Interactive Patient Education  2017 [employer].  Fall Prevention in the Home Falls can cause injuries. They can happen to people of all ages. There are many things you can do to make your home safe and to help prevent falls. What can I do on the outside of my home? Regularly fix the edges of walkways and driveways and fix any cracks. Remove anything that might make you trip as you walk through a door, such as a raised step or threshold. Trim any bushes or trees on the path to your home. Use bright outdoor lighting. Clear any walking paths of anything that might make someone trip, such as rocks or tools. Regularly check to see if handrails are loose or broken. Make sure that both sides of any steps have handrails. Any raised decks and porches should have guardrails on the edges. Have any leaves, snow, or ice cleared regularly. Use sand or salt on walking paths during winter. Clean up any spills in your garage right away. This includes oil or grease spills. What can I do in the bathroom? Use night lights. Install grab bars by the toilet and in the tub and shower. Do not use towel bars as grab bars. Use non-skid mats or decals in the tub or shower. If you need to sit down in the shower, use a plastic, non-slip stool. Keep the floor dry. Clean up any water that spills on the floor as soon as it happens. Remove soap buildup in the tub or shower regularly. Attach bath mats securely with double-sided non-slip rug tape. Do not have throw rugs and other things on the floor that can make you trip. What can I do in the bedroom? Use night lights. Make sure that you have a light by your bed that is easy to reach. Do not use any sheets or blankets that are too big for your bed. They should not hang down onto the floor. Have a  firm chair that has side arms. You can use this for support while you get dressed. Do not have throw rugs and other things on the floor that can make you trip. What can I do in the kitchen? Clean up any spills right away. Avoid walking on wet floors. Keep items that you use a lot in easy-to-reach places. If you need to reach something above you, use a strong step stool that has a grab bar. Keep electrical cords out of the way. Do not use floor polish or wax that makes floors slippery. If you must use wax, use non-skid floor wax. Do not have throw rugs and other things on the floor that can make you trip. What can I do with my stairs? Do not leave any items on the stairs. Make sure that there are handrails on both sides of the stairs and use them. Fix handrails that are broken or loose. Make sure that handrails are as long as the stairways. Check any carpeting to make sure that it is firmly attached to the stairs. Fix any carpet that is loose or worn. Avoid having throw rugs at the top or bottom of the stairs. If you do have throw rugs, attach them to the floor with carpet tape. Make sure that you have a light switch at the top of the stairs  and the bottom of the stairs. If you do not have them, ask someone to add them for you. What else can I do to help prevent falls? Wear shoes that: Do not have high heels. Have rubber bottoms. Are comfortable and fit you well. Are closed at the toe. Do not wear sandals. If you use a stepladder: Make sure that it is fully opened. Do not climb a closed stepladder. Make sure that both sides of the stepladder are locked into place. Ask someone to hold it for you, if possible. Clearly mark and make sure that you can see: Any grab bars or handrails. First and last steps. Where the edge of each step is. Use tools that help you move around (mobility aids) if they are needed. These include: Canes. Walkers. Scooters. Crutches. Turn on the lights when you  go into a dark area. Replace any light bulbs as soon as they burn out. Set up your furniture so you have a clear path. Avoid moving your furniture around. If any of your floors are uneven, fix them. If there are any pets around you, be aware of where they are. Review your medicines with your doctor. Some medicines can make you feel dizzy. This can increase your chance of falling. Ask your doctor what other things that you can do to help prevent falls. This information is not intended to replace advice given to you by your health care provider. Make sure you discuss any questions you have with your health care provider. Document Released: 03/19/2009 Document Revised: 10/29/2015 Document Reviewed: 06/27/2014 Elsevier Interactive Patient Education  2017 [employer].

## 2023-09-20 NOTE — Progress Notes (Signed)
 Subjective:   Marissa Hall is a 83 y.o. female who presents for Medicare Annual (Subsequent) preventive examination.  Visit Complete: Virtual I connected with  Rachel Moulds on 09/20/23 by a audio enabled telemedicine application and verified that I am speaking with the correct person using two identifiers.  Patient Location: Home  Provider Location: Home Office  I discussed the limitations of evaluation and management by telemedicine. The patient expressed understanding and agreed to proceed.  Vital Signs: Because this visit was a virtual/telehealth visit, some criteria may be missing or patient reported. Any vitals not documented were not able to be obtained and vitals that have been documented are patient reported.  Cardiac Risk Factors include: advanced age (>51men, >76 women);diabetes mellitus;hypertension;family history of premature cardiovascular disease;obesity (BMI >30kg/m2)     Objective:    There were no vitals filed for this visit. There is no height or weight on file to calculate BMI.     09/20/2023   10:16 AM 09/07/2022   10:39 AM 05/10/2022   11:18 AM 05/02/2022   11:40 AM 10/05/2021    9:38 PM 09/01/2021    8:39 AM 02/02/2021    2:03 PM  Advanced Directives  Does Patient Have a Medical Advance Directive? Yes Yes Yes Yes No Yes Yes  Type of Estate agent of [employer] Power of [place];Living will Healthcare Power of [place];Living will Healthcare Power of [place];Living will  Healthcare Power of [employer] of [place];Living will  Does patient want to make changes to medical advance directive?       No - Patient declined  Copy of Healthcare Power of Attorney in Chart? No - copy requested No - copy requested No - copy requested   No - copy requested No - copy requested  Would patient like information on creating a medical advance directive?     No - Patient declined      Current Medications (verified) Outpatient  Encounter Medications as of 09/20/2023  Medication Sig   carvedilol (COREG) 3.125 MG tablet Take 1 tablet (3.125 mg total) by mouth 2 (two) times daily with a meal.   cholecalciferol (VITAMIN D3) 25 MCG (1000 UNIT) tablet Take 1,000 Units by mouth daily.   DULoxetine (CYMBALTA) 60 MG capsule Take 1 capsule (60 mg total) by mouth daily.   felodipine (PLENDIL) 10 MG 24 hr tablet Take 1 tablet (10 mg total) by mouth daily.   folic acid (FOLVITE) 1 MG tablet Take 1 tablet (1 mg total) by mouth daily.   glimepiride (AMARYL) 1 MG tablet Take 1 tablet (1 mg total) by mouth daily with breakfast.   levothyroxine (SYNTHROID) 112 MCG tablet Take 1 tablet (112 mcg total) by mouth daily.   Multiple Vitamin (MULTIVITAMIN WITH MINERALS) TABS tablet Take 1 tablet by mouth daily.   Propylene Glycol (SYSTANE BALANCE OP) Place 1 drop into both eyes daily as needed (for dry eyes).   thiamine 100 MG tablet Take 1 tablet (100 mg total) by mouth daily.   traZODone (DESYREL) 50 MG tablet Take 1 tablet (50 mg total) by mouth at bedtime.   valsartan (DIOVAN) 160 MG tablet Take 1 tablet (160 mg total) by mouth daily.   No facility-administered encounter medications on file as of 09/20/2023.    Allergies (verified) Other, Prednisone, Amlodipine, Hydrochlorothiazide, and Penicillins   History: Past Medical History:  Diagnosis Date   Acute DVT (deep venous thrombosis) (HCC) 04/23/2019   Right popliteal   Alcoholism (HCC)    Anxiety  and depression 1964   oncologist started duloxetine 12/2016   Breast cancer (HCC) 03/14/2016   Clinical stage 2A: (triple neg): Right breast, upper inner quadrant, 03/2016.  Neoadjuvant chemo x 5 cycles,lumpectomy 4 mo later, then RT started 10/2016.  Adjuvant Xeloda [id number].  SWOG research trial pt 04/2017--pt randomized to pembrolizumab immunotherapy.  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  A1c Mar 12, 2018 was 7.0% at Dr. Birdena Buggy.    Epidermoid cyst of vulva    Chronic epidermoid cyst of the vulva.  Excision done 02/2019   GERD (gastroesophageal reflux disease) 2013   History of basal cell cancer    L ankle   Hyperlipidemia 1986   Hypertension 2008   2022 ->addition of hctz to lisinopril led to 25ml drop in GFR. HCTZ d/cd.   Hypothyroidism 1988   Diagnosed in her 25s.  Managed by Endocrinologist   Lumbar radiculopathy 04/2019   Dr. Felipe Horton to get plain films of LB and hip (considering MRI due to her hx of cancer)   Nonischemic cardiomyopathy (HCC)    Hx of takotsubo CM   Osteoporosis 2015   pt states "osteopenia", but then says that she refused to take the rx med for this condition, so I suspect she had osteoporosis.   Peripheral neuropathy 2017   Patient states diabetic neuropathy in feet prior to starting chemotherapy and then worsened by chemo.    SCC (squamous cell carcinoma), arm, right    Syncope and collapse    05/2021 while in [place] .  No prodrome.  Monitor ordered and cardiology referral ordered 05/14/21   TIA (transient ischemic attack) 03/26/2011   2012: question of (HA + R eye "floaters"). CT in ED neg acute. Not admitted, no f/u testing done.  ?ocular migraine?   Past Surgical History:  Procedure Laterality Date   ABDOMINAL HYSTERECTOMY  1972   APPENDECTOMY  1972   BREAST ENHANCEMENT SURGERY  1982   BREAST IMPLANT REMOVAL Right 09/13/2016   Procedure: REMOVAL RIGHT BREAST IMPLANT;  Surgeon: Alger Infield, MD;  Location: [facility];  Service: Plastics;  Laterality: Right;   BREAST  LUMPECTOMY WITH RADIOACTIVE SEED AND SENTINEL LYMPH NODE BIOPSY Right 09/13/2016   Procedure: RIGHT BREAST LUMPECTOMY WITH RADIOACTIVE SEED X 2 AND SENTINEL LYMPH NODE BIOPSY;  Surgeon: Juanita Norlander, MD;  Location: [facility];  Service: General;  Laterality: Right;   BREAST SURGERY Right 03/14/2016   Biopsy   CAPSULECTOMY Right 09/13/2016   Procedure: RIGHT CAPSULECTOMY;  Surgeon: Alger Infield, MD;  Location: [facility];  Service: Plastics;  Laterality: Right;   CATARACT EXTRACTION, BILATERAL Bilateral 08/10/17 right eye, 08/31/17 left eye   MASS EXCISION Left 02/28/2018   Path: benign.  Procedure: EXCISIONLEFT MEDIAL THIGH MASS ERAS PATHWAY;  Surgeon: Sim Dryer, MD;  Location: [facility];  Service: General;  Laterality: Left;   PORTACATH PLACEMENT N/A 03/15/2016   Procedure: INSERTION PORT-A-CATH WITH US ;  Surgeon: Juanita Norlander, MD;  Location: [facility];  Service: General;  Laterality: N/A;  PORTACATH REMOVAL  07/2017   SLEEP STUDY  09/2021   NO SLEEP APNEA   surgical repair left ankle Left 2009   s/p Fall    TONSILLECTOMY AND ADENOIDECTOMY  1948   Age 23   TOTAL HIP ARTHROPLASTY Left 10/06/2021   Procedure: TOTAL HIP ARTHROPLASTY ANTERIOR APPROACH;  Surgeon: Eldred Manges, MD;  Location: [facility];  Service: Orthopedics;  Laterality: Left;   US CAROTID DOPPLER BILATERAL ([facility] HX)  01/2021   <50% bilat int carotid sten, otherwise normal.   Zio monitoring     06/2021; sinus, some mild brady, brief SVT, no signif PVCs   Family History  Problem Relation Age of Onset   Stroke Mother    Suicidality Father    Stroke Brother    Stroke Son    Sleep apnea Son    Social History   Socioeconomic History   Marital status: Divorced    Spouse name: Not on file   Number of children: Not on file   Years of education: Not on file   Highest education level: Associate degree: academic program  Occupational History   Not on file  Tobacco Use    Smoking status: Former    Current packs/day: 0.00    Types: Cigarettes    Quit date: 03/08/1999    Years since quitting: 24.5   Smokeless tobacco: Never  Vaping Use   Vaping status: Never Used  Substance and Sexual Activity   Alcohol use: Yes    Alcohol/week: 7.0 standard drinks of alcohol    Types: 7 Shots of liquor per week    Comment:  daily   Drug use: No   Sexual activity: Not Currently    Partners: Male    Birth control/protection: Surgical    Comment: 1st intercourse- 19, partners- 10+, current partner- 8 yrs, hysterectomy  Other Topics Concern   Not on file  Social History Narrative   Divorced, has a son who lives in [place].   Educ: college   Occup: Airline pilot, still working part time.   Tob: quit 2000.     Alc: several times a week--1-2 glasses wine.   No drugs.   Social Drivers of Health   Financial Resource Strain: Medium Risk (09/20/2023)   Overall Financial Resource Strain (CARDIA)    Difficulty of Paying Living Expenses: Somewhat hard  Food Insecurity: No Food Insecurity (09/20/2023)   Hunger Vital Sign    Worried About Running Out of Food in the Last Year: Never true    Ran Out of Food in the Last Year: Never true  Transportation Needs: No Transportation Needs (08/22/2023)   PRAPARE - Administrator, Civil Service (Medical): No    Lack of Transportation (Non-Medical): No  Physical Activity: Inactive (09/20/2023)   Exercise Vital Sign    Days of Exercise per Week: 0 days    Minutes of Exercise per Session: 0 min  Stress: Stress Concern Present (09/20/2023)   [employer] of Occupational Health - Occupational Stress Questionnaire    Feeling of Stress : To some extent  Social Connections: Moderately Isolated (09/20/2023)   Social Connection and Isolation Panel [NHANES]    Frequency of Communication with Friends and Family: More than three times a week    Frequency of Social Gatherings with Friends and Family: More than three times a week     Attends Religious Services: Never    Database administrator or Organizations: Yes    Attends Banker Meetings: More  than 4 times per year    Marital Status: Divorced    Tobacco Counseling Counseling given: Not Answered   Clinical Intake:  Pre-visit preparation completed: Yes  Pain : No/denies pain     Diabetes: Yes CBG done?: No Did pt. bring in CBG monitor from home?: No  How often do you need to have someone help you when you read instructions, pamphlets, or other written materials from your doctor or pharmacy?: 1 - Never  Interpreter Needed?: No  Information entered by :: Remi Haggard LPN   Activities of Daily Living    09/20/2023   10:14 AM  In your present state of health, do you have any difficulty performing the following activities:  Difficulty concentrating or making decisions? 0  Walking or climbing stairs? 0  Dressing or bathing? 0  Doing errands, shopping? 0  Preparing Food and eating ? N  Using the Toilet? N  In the past six months, have you accidently leaked urine? Y  Do you have problems with loss of bowel control? N  Managing your Medications? N  Managing your Finances? N  Housekeeping or managing your Housekeeping? N    Patient Care Team: Jeoffrey Massed, MD as PCP - General (Family Medicine) Ovidio Kin, MD as Consulting Physician (General Surgery) Serena Croissant, MD as Consulting Physician (Hematology and Oncology) Dorothy Puffer, MD as Consulting Physician (Radiation Oncology) Glenna Fellows, MD as Consulting Physician (Plastic Surgery) Causey, Larna Daughters, NP as Nurse Practitioner (Hematology and Oncology) Genia Del, MD as Consulting Physician (Obstetrics and Gynecology) Tsamis, Georganna Skeans, MD as Referring Physician (Ophthalmology) [facility], Konrad Dolores, MD as Consulting Physician (Endocrinology)  Indicate any recent Medical Services you may have received from other than [facility] providers in the past  year (date may be approximate).     Assessment:   This is a routine wellness examination for [place].  Hearing/Vision screen Hearing Screening - Comments:: No trouble hearing  Vision Screening - Comments:: [place] [facility]   Goals Addressed             This Visit's Progress    Patient Stated   On track    Take better care of blood pressure and blood sugar      Patient Stated       [place] current lifestyle       Depression Screen    09/20/2023   10:23 AM 08/22/2023   11:10 AM 04/20/2023    2:01 PM 11/11/2022    3:15 PM 09/27/2022   11:15 AM 09/07/2022   10:37 AM 07/08/2022   10:54 AM  PHQ 2/9 Scores  PHQ - 2 Score 0 0 0 1 1 0 2  PHQ- 9 Score 6 0  4 5  8     Fall Risk    09/20/2023   10:12 AM 11/11/2022    3:15 PM 09/27/2022   11:14 AM 09/07/2022   10:40 AM 12/24/2021   11:11 AM  Fall Risk   Falls in the past year? 1 1 1 1 1   Number falls in past yr: 0 0 1 1 1   Injury with Fall? 1 1 1 1 1   Comment fell over something in dog lot.   cut finger   hip broken   Risk for fall due to :  History of fall(s) History of fall(s);Impaired balance/gait;Impaired vision Impaired balance/gait;Impaired mobility;Impaired vision;History of fall(s)   Follow up Falls evaluation completed;Education provided;Falls prevention discussed Falls evaluation completed Falls evaluation completed Falls prevention discussed  MEDICARE RISK AT HOME: Medicare Risk at Home Any stairs in or around the home?: Yes If so, are there any without handrails?: No Home free of loose throw rugs in walkways, pet beds, electrical cords, etc?: Yes Adequate lighting in your home to reduce risk of falls?: Yes Life alert?: No Use of a cane, walker or w/c?: No Grab bars in the bathroom?: Yes Shower chair or bench in shower?: No Elevated toilet seat or a handicapped toilet?: No  TIMED UP AND GO:  Was the test performed?  No    Cognitive Function:        09/20/2023   10:19 AM 09/07/2022   10:41 AM 09/01/2021     8:42 AM  6CIT Screen  What Year? 0 points 0 points 0 points  What month? 0 points 0 points 0 points  What time? 0 points 0 points 0 points  Count back from 20 0 points 0 points 0 points  Months in reverse 0 points 0 points 0 points  Repeat phrase 0 points 0 points 2 points  Total Score 0 points 0 points 2 points    Immunizations Immunization History  Administered Date(s) Administered   Fluad Quad(high Dose 65+) 03/13/2019, 05/11/2020, 02/18/2021, 05/06/2022   Fluad Trivalent(High Dose 65+) 04/20/2023   Influenza, High Dose Seasonal PF 05/03/2017   Influenza,inj,Quad PF,6+ Mos 04/11/2016   PFIZER(Purple Top)SARS-COV-2 Vaccination 07/21/2019, 08/13/2019   Tdap 10/05/2021   Zoster, Live 06/06/2010    TDAP status: Up to date  Flu Vaccine status: Up to date  Pneumococcal vaccine status: Up to date  Covid-19 vaccine status: Information provided on how to obtain vaccines.   Qualifies for Shingles Vaccine? Yes   Zostavax completed No   Shingrix Completed?: No.    Education has been provided regarding the importance of this vaccine. Patient has been advised to call insurance company to determine out of pocket expense if they have not yet received this vaccine. Advised may also receive vaccine at local pharmacy or Health Dept. Verbalized acceptance and understanding.  Screening Tests Health Maintenance  Topic Date Due   Zoster Vaccines- Shingrix (1 of 2) [birth year]26[birth year]1962   COVID-19 Vaccine (3 - Pfizer risk series) 09/10/2019   OPHTHALMOLOGY EXAM  09/28/2022   Pneumonia Vaccine 34+ Years old (1 of 2 - PCV) 11/11/2023 (Originally 04/26/1960)   INFLUENZA VACCINE  01/05/2024   HEMOGLOBIN A1C  02/17/2024   Diabetic kidney evaluation - eGFR measurement  04/19/2024   MAMMOGRAM  06/29/2024   Diabetic kidney evaluation - Urine ACR  08/16/2024   FOOT EXAM  08/16/2024   Medicare Annual Wellness (AWV)  09/19/2024   DTaP/Tdap/Td (2 - Td or Tdap) 10/06/2031   DEXA SCAN  Completed   HPV VACCINES   Aged Out   Meningococcal B Vaccine  Aged Out    Health Maintenance  Health Maintenance Due  Topic Date Due   Zoster Vaccines- Shingrix (1 of 2) 04[birth year]26/1962   COVID-19 Vaccine (3 - Pfizer risk series) 09/10/2019   OPHTHALMOLOGY EXAM  09/28/2022    Colorectal cancer screening: No longer required.   Mammogram status: Completed  . Repeat every year  Bone Density status: Ordered  . Pt provided with contact info and advised to call to schedule appt.  Lung Cancer Screening: (Low Dose CT Chest recommended if Age 66-80 years, 20 pack-year currently smoking OR have quit w/in 15years.) does not qualify.   Lung Cancer Screening Referral:   Additional Screening:  Hepatitis C Screening  never done  Vision Screening:  Recommended annual ophthalmology exams for early detection of glaucoma and other disorders of the eye. Is the patient up to date with their annual eye exam?  Yes  Who is the provider or what is the name of the office in which the patient attends annual eye exams? [facility]  [place] If pt is not established with a provider, would they like to be referred to a provider to establish care? No .   Dental Screening: Recommended annual dental exams for proper oral hygiene  Nutrition Risk Assessment:  Has the patient had any N/V/D within the last 2 months?  No  Does the patient have any non-healing wounds?  No  Has the patient had any unintentional weight loss or weight gain?  No   Diabetes:  Is the patient diabetic?  Yes  If diabetic, was a CBG obtained today?  No  Did the patient bring in their glucometer from home?  No  How often do you monitor your CBG's? Does not check.   Financial Strains and Diabetes Management:  Are you having any financial strains with the device, your supplies or your medication? No .  Does the patient want to be seen by Chronic Care Management for management of their diabetes?  No  Would the patient like to be referred to a Nutritionist or for  Diabetic Management?  No   Diabetic Exams:  Diabetic Eye Exam:. Pt has been advised about the importance in completing this exam.   Diabetic Foot Exam: . Pt has been advised about the importance in completing this exam. .    Community Resource Referral / Chronic Care Management: CRR required this visit?  No   CCM required this visit?  No     Plan:     I have personally reviewed and noted the following in the patient's chart:   Medical and social history Use of alcohol, tobacco or illicit drugs  Current medications and supplements including opioid prescriptions. Patient is not currently taking opioid prescriptions. Functional ability and status Nutritional status Physical activity Advanced directives List of other physicians Hospitalizations, surgeries, and ER visits in previous 12 months Vitals Screenings to include cognitive, depression, and falls Referrals and appointments  In addition, I have reviewed and discussed with patient certain preventive protocols, quality metrics, and best practice recommendations. A written personalized care plan for preventive services as well as general preventive health recommendations were provided to patient.     Kieth Pelt, LPN   4/69/6295   After Visit Summary: (MyChart) Due to this being a telephonic visit, the after visit summary with patients personalized plan was offered to patient via MyChart   Nurse Notes:   Patient did cut her hand on fence.  TDAP is up to date.   Advised on watching for infection and if it is healing  Patient voiced understanding

## 2023-10-04 ENCOUNTER — Telehealth (HOSPITAL_BASED_OUTPATIENT_CLINIC_OR_DEPARTMENT_OTHER): Payer: Self-pay

## 2023-10-04 ENCOUNTER — Other Ambulatory Visit: Payer: Self-pay | Admitting: Family Medicine

## 2023-10-21 ENCOUNTER — Other Ambulatory Visit: Payer: Self-pay | Admitting: Family Medicine

## 2023-11-02 ENCOUNTER — Telehealth: Payer: Self-pay

## 2023-11-02 MED ORDER — ATORVASTATIN CALCIUM 80 MG PO TABS: 80.0000 mg | ORAL_TABLET | Freq: Every day | ORAL | 3 refills | Status: DC

## 2023-11-02 NOTE — Telephone Encounter (Signed)
 [facility] requesting Atorvastatin  but it no longer on patient medication list. Okay to fill ?

## 2023-11-16 ENCOUNTER — Ambulatory Visit: Admitting: Family Medicine

## 2023-11-16 ENCOUNTER — Ambulatory Visit

## 2023-11-16 VITALS — BP 130/84 | HR 62 | Ht 68.0 in | Wt 167.6 lb

## 2023-11-16 DIAGNOSIS — Z7409 Other reduced mobility: Secondary | ICD-10-CM

## 2023-11-16 DIAGNOSIS — M47816 Spondylosis without myelopathy or radiculopathy, lumbar region: Secondary | ICD-10-CM | POA: Diagnosis not present

## 2023-11-16 DIAGNOSIS — G8929 Other chronic pain: Secondary | ICD-10-CM

## 2023-11-16 DIAGNOSIS — I700 Atherosclerosis of aorta: Secondary | ICD-10-CM

## 2023-11-16 DIAGNOSIS — M545 Low back pain, unspecified: Secondary | ICD-10-CM | POA: Diagnosis not present

## 2023-11-16 DIAGNOSIS — M419 Scoliosis, unspecified: Secondary | ICD-10-CM | POA: Diagnosis not present

## 2023-11-16 DIAGNOSIS — Z780 Asymptomatic menopausal state: Secondary | ICD-10-CM | POA: Diagnosis not present

## 2023-11-16 NOTE — Patient Instructions (Signed)
 Thank you for coming in today.   Please get an Xray today before you leave   Schedule a dexa scan.   Keep a blood pressure log.   Recheck in 1 month.   I've referred you to Physical Therapy.  Let us  know if you don't hear from them in one week.

## 2023-11-16 NOTE — Progress Notes (Signed)
 Marissa Muck, PhD, LAT, ATC acting as a scribe for Garlan Juniper, MD.  Marissa Hall is a 83 y.o. female who presents to [facility] today for chronic low back pain and imbalance and frequent falls.  Patient has chronic low back pain without significant pain radiating to her legs.  She has a vague sense of some leg weakness and poor sensation in her leg contributing to sensation of imbalance and falls.  She has had a trial of balance physical therapy about a year ago but nothing since.    Pertinent review of systems: No fevers or chills  Relevant historical information: History of a TIA.  Diabetes.  Neuropathy secondary to chemotherapy.   Exam:  BP 130/84   Pulse 62   Ht 5' 8 (1.727 m)   Wt 167 lb 9.6 oz (76 kg)   SpO2 98%   BMI 25.48 kg/m  General: Well Developed, well nourished, and in no acute distress.   MSK: L-spine normal appearing Nontender palpation spinal midline. Decreased lumbar motion. Mild unstable gait.   Lab and Radiology Results  X-ray images lumbar spine obtained today personally and independently interpreted. Degenerative scoliosis concave right.  Multilevel DDD.  No acute fractures are visible.  Aorta atherosclerosis is present. Await formal radiology review    Assessment and Plan: 83 y.o. female with chronic low back pain with balance difficulty.  The neuropathy could be playing a role in the balance and the degenerative changes seen on x-ray certainly could be a playing a role in the back pain.  She could improve with physical therapy.  Plan for retrial of PT.  Additionally she has a history of a few fragility fractures.  It looks like there was an attempt to arrange for a bone density test but she was never called to schedule.  I am going to be able to get her a bone density test a little sooner at the [place] office at 11.  Will go ahead and arrange for that and follow-up in 1 month.  She would consider taking  medications for osteoporosis if needed.  I do see aortic atherosclerosis.  Will discuss with PCP consider vascular ultrasound.   PDMP not reviewed this encounter. Orders Placed This Encounter  Procedures   DG BONE DENSITY (DXA)    Standing Status:   Future    Expiration Date:   11/15/2024    Reason for Exam (SYMPTOM  OR DIAGNOSIS REQUIRED):   eval bone density. hx fall and hip fx    Preferred imaging location?:   [address]   DG Lumbar Spine 2-3 Views    Standing Status:   Future    Number of Occurrences:   1    Expiration Date:   11/15/2024    Reason for Exam (SYMPTOM  OR DIAGNOSIS REQUIRED):   eval lowback pain    Preferred imaging location?:   [facility]   Ambulatory referral to Physical Therapy    Referral Priority:   Routine    Referral Type:   Physical Medicine    Referral Reason:   Specialty Services Required    Requested Specialty:   Physical Therapy    Number of Visits Requested:   1   No orders of the defined types were placed in this encounter.    Discussed warning signs or symptoms. Please see discharge instructions. Patient expresses understanding.   The above documentation has been reviewed and is accurate and complete Garlan Juniper, M.D.

## 2023-11-22 ENCOUNTER — Ambulatory Visit (INDEPENDENT_AMBULATORY_CARE_PROVIDER_SITE_OTHER)
Admission: RE | Admit: 2023-11-22 | Discharge: 2023-11-22 | Disposition: A | Discharge status: Home / Self Care | Source: Ambulatory Visit

## 2023-11-22 ENCOUNTER — Ambulatory Visit: Payer: Self-pay | Admitting: Family Medicine

## 2023-11-22 DIAGNOSIS — Z780 Asymptomatic menopausal state: Secondary | ICD-10-CM

## 2023-11-22 DIAGNOSIS — I700 Atherosclerosis of aorta: Secondary | ICD-10-CM

## 2023-11-22 NOTE — Progress Notes (Signed)
Lumbar spine x-ray shows arthritis.

## 2023-11-24 NOTE — Progress Notes (Signed)
 Bone density test shows osteopenia range of reduced bone density.  However given your fracture history your risk of having another fracture is high enough that medications would make sense.  You have an appointment scheduled with me for July 14.  We can talk about the results of this test and what to do about it in full detail during that visit.

## 2023-11-24 NOTE — Telephone Encounter (Signed)
-----   Message from Shelvia Dick sent at 11/17/2023  7:52 AM EDT ----- Regarding: RE: Aortic atherosclerosis Sure! ----- Message ----- From: Syliva Even, MD Sent: 11/16/2023   6:05 PM EDT To: Shelvia Dick, MD Subject: Aortic atherosclerosis                         I did a lumbar spine x-ray on this patient today.  I noted that she has pretty considerable aortic atherosclerosis.  Would like me to arrange for vascular ultrasound looking at her abdominal aorta? Thanks, Armonie Mettler

## 2023-11-24 NOTE — Telephone Encounter (Signed)
 I have ordered a vascular ultrasound to look for aortic aneurysm based on the aortic atherosclerosis disease seen on the lumbar spine x-ray.  You should hear soon from the vascular imaging center about getting this test set up.

## 2023-12-04 NOTE — Therapy (Signed)
 OUTPATIENT PHYSICAL THERAPY THORACOLUMBAR EVALUATION   Patient Name: Marissa Hall MRN: [id number] DOB:[birth year], 83 y.o., female Today's Date: 12/05/2023  END OF SESSION:  PT End of Session - 12/05/23 1431     Visit Number 1    Date for PT Re-Evaluation 01/30/24    Authorization Type MCR with [employer] supplement    Progress Note Due on Visit 10    PT Start Time 1420    PT Stop Time 1515    PT Time Calculation (min) 55 min    Activity Tolerance Patient tolerated treatment well;No increased pain    Behavior During Therapy [facility] for tasks assessed/performed          Past Medical History:  Diagnosis Date   Acute DVT (deep venous thrombosis) (HCC) 04/23/2019   Right popliteal   Alcoholism (HCC)    Anxiety and depression 1964   oncologist started duloxetine  12/2016   Breast cancer (HCC) 03/14/2016   Clinical stage 2A: (triple neg): Right breast, upper inner quadrant, 03/2016.  Neoadjuvant chemo x 5 cycles,lumpectomy 4 mo later, then RT started 10/2016.  Adjuvant Xeloda  8-10,2018.  SWOG research trial pt 04/2017--pt randomized to pembrolizumab  immunotherapy.  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  A1c Mar 12, 2018 was 7.0% at Dr. Karoline.    Epidermoid cyst of vulva    Chronic epidermoid cyst of the vulva.  Excision done 02/2019   GERD (gastroesophageal reflux disease) 2013   History of basal cell cancer    L ankle   Hyperlipidemia 1986   Hypertension 2008   2022 ->addition of hctz to lisinopril  led to 25ml drop in GFR. HCTZ d/cd.    Hypothyroidism 1988   Diagnosed in her 60s.  Managed by Endocrinologist   Lumbar radiculopathy 04/2019   Dr. Claudene to get plain films of LB and hip (considering MRI due to her hx of cancer)   Nonischemic cardiomyopathy (HCC)    Hx of takotsubo CM   Osteoporosis 2015   pt states osteopenia, but then says that she refused to take the rx med for this condition, so I suspect she had osteoporosis.   Peripheral neuropathy 2017   Patient states diabetic neuropathy in feet prior to starting chemotherapy and then worsened by chemo.    SCC (squamous cell carcinoma), arm, right    Syncope and collapse    05/2021 while in [place] .  No prodrome.  Monitor ordered and cardiology referral ordered 05/14/21   TIA (transient ischemic attack) 03/26/2011   2012: question of (HA + R eye floaters). CT in ED neg acute. Not admitted, no f/u testing done.  ?ocular migraine?   Past Surgical History:  Procedure Laterality Date   ABDOMINAL HYSTERECTOMY  1972   APPENDECTOMY  1972   BREAST ENHANCEMENT SURGERY  1982   BREAST IMPLANT REMOVAL Right 09/13/2016   Procedure: REMOVAL RIGHT BREAST IMPLANT;  Surgeon: Earlis Ranks, MD;  Location:  SURGERY CENTER;  Service: Plastics;  Laterality: Right;   BREAST LUMPECTOMY WITH RADIOACTIVE SEED AND SENTINEL LYMPH NODE BIOPSY Right 09/13/2016  Procedure: RIGHT BREAST LUMPECTOMY WITH RADIOACTIVE SEED X 2 AND SENTINEL LYMPH NODE BIOPSY;  Surgeon: Alm Angle, MD;  Location: [facility];  Service: General;  Laterality: Right;   BREAST SURGERY Right 03/14/2016   Biopsy   CAPSULECTOMY Right 09/13/2016   Procedure: RIGHT CAPSULECTOMY;  Surgeon: Earlis Ranks, MD;  Location: [facility];  Service: Plastics;  Laterality: Right;   CATARACT EXTRACTION, BILATERAL Bilateral 08/10/17 right eye, 08/31/17 left eye   MASS EXCISION Left 02/28/2018   Path: benign.  Procedure: EXCISIONLEFT MEDIAL THIGH MASS ERAS PATHWAY;  Surgeon: Vanderbilt Ned,  MD;  Location: [facility];  Service: General;  Laterality: Left;   PORTACATH PLACEMENT N/A 03/15/2016   Procedure: INSERTION PORT-A-CATH WITH US ;  Surgeon: Alm Angle, MD;  Location: [facility];  Service: General;  Laterality: N/A;   PORTACATH REMOVAL  07/2017   SLEEP STUDY  09/2021   NO SLEEP APNEA   surgical repair left ankle Left 2009   s/p Fall    TONSILLECTOMY AND ADENOIDECTOMY  1948   Age 16   TOTAL HIP ARTHROPLASTY Left 10/06/2021   Procedure: TOTAL HIP ARTHROPLASTY ANTERIOR APPROACH;  Surgeon: Barbarann Oneil BROCKS, MD;  Location: [facility];  Service: Orthopedics;  Laterality: Left;   US  CAROTID DOPPLER BILATERAL ([facility] HX)  01/2021   <50% bilat int carotid sten, otherwise normal.   Zio monitoring     06/2021; sinus, some mild brady, brief SVT, no signif PVCs   Patient Active Problem List   Diagnosis Date Noted   Atherosclerosis of abdominal aorta (HCC) 11/16/2023   S/P total hip arthroplasty 10/19/2021   Closed fracture of proximal end of left femur, initial encounter (HCC) 10/06/2021   Hyperkalemia 10/06/2021   Alcohol use 10/06/2021   Malnutrition of moderate degree 10/06/2021   Syncope and collapse 06/10/2021   Closed fracture of multiple ribs of right side 04/30/2021   Acquired hypothyroidism 02/23/2021   Type 2 diabetes mellitus with diabetic polyneuropathy, without long-term current use of insulin  (HCC) 02/23/2021   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   Diabetes mellitus type 2 without retinopathy (HCC) 09/21/2020   Meibomian gland dysfunction (MGD) of both eyes 09/21/2020   History of COVID-19 06/23/2020   COVID-19 virus infection 05/27/2020   Piriformis syndrome of right side 05/16/2019   Lower extremity pain, right 04/23/2019   DVT (deep venous thrombosis) (HCC) 04/23/2019   Lumbar radiculopathy, right 04/02/2019   Cataracts, bilateral 07/2017   Other long term (current) drug therapy 06/14/2017   Anxiety and depression    History of breast cancer 03/02/2017    History of therapeutic radiation 03/02/2017   Breast pain, right 10/03/2016   Type 2 diabetes mellitus (HCC) 08/23/2016   Hypertension associated with diabetes (HCC) 08/23/2016   Hypothyroidism 08/23/2016   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/04/2016   Port catheter in place 05/09/2016   Peripheral neuropathy 05/09/2016   Breast cancer of upper-inner quadrant of right female breast (HCC) 03/09/2016   Major depressive disorder, recurrent (HCC) 02/27/2014   Uncomplicated alcohol dependence (HCC) 02/19/2014   Osteoporosis 2015   GERD (gastroesophageal reflux disease) 2013   TIA (transient ischemic attack) 03/26/2011   Epidermoid cyst of vulva 2008   Hyperlipidemia associated with type 2 diabetes mellitus (HCC) 1986    PCP: Candise Aleene DEL, MD   REFERRING PROVIDER: Joane Artist RAMAN, MD   REFERRING DIAG:  M54.50,G89.29 (ICD-10-CM) - Chronic bilateral low back pain without sciatica  Z74.09 (ICD-10-CM) - Impaired functional mobility, balance, and endurance  THERAPY DIAG:  Unsteadiness on feet  Muscle weakness (generalized)  Stiffness of left hip, not elsewhere classified  Stiffness of right hip, not elsewhere classified  Other low back pain  History of falling  RATIONALE FOR EVALUATION AND TREATMENT: Rehabilitation  ONSET DATE: last several years   NEXT MD VISIT: unsure   SUBJECTIVE:                                                                                                                                                                                                         SUBJECTIVE STATEMENT:  Patient is referred to PT for LBP without sciatica and balance deficits.    Patient reports she has had LBP for a long time now.   She states it is bothersome, but not severe.  States feels like balance is bigger problem and wants to focus on this more than her back pain.  However, she definitely would like therapy for both issues.     Patient states she has had  numerous falls in the past with L hip fx/hemiarthrplasty, rib fx's, etc.    She reports she has fallen once in the past month.   States she stood up and passed out.   She denies any injury with this fall.   She is having a hard time doing any yardwork, feeding her dogs/chickens, or simply walking outdoors in general.   She reports difficulty stepping over or around objects in the home, including her dog.   She states her Back is worse with lifting and household chores, and better with lying down.   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 PAIN: Are you having pain? Yes: NPRS scale: 0/10, 6/10 worst over 24 hr period Pain location: low back centrally Pain description: sore achy, intermittent Aggravating factors: lifting, digging in garden, carrying Relieving factors: tylenol , lying down  PERTINENT HISTORY:   DM 2 with neuropathy, chemo induced neuropathy, nonischemic cardiomyopathy, h/o L breast CA 2018, HTN, HLD, h/o RLE DVT  3 yrs ago, falls  PRECAUTIONS: None  RED FLAGS: None  WEIGHT BEARING RESTRICTIONS: No  FALLS:  Has patient fallen in last 6 months? Yes. Number of falls 1  LIVING ENVIRONMENT: Lives with: lives alone Lives in: House/apartment Stairs: Yes: External: 3 steps; can reach both Has following equipment at home: Single point cane and walking stick  OCCUPATION: accountant, still working  PLOF: Independent with community mobility without device and Independent with gait, but admittedly unsteady  PATIENT GOALS: feel safer walking and have better balance   OBJECTIVE: (objective measures  completed at initial evaluation unless otherwise dated)  DIAGNOSTIC FINDINGS:  CLINICAL DATA:  Low back pain for 1 month.   EXAM: LUMBAR SPINE - 2-3 VIEW   COMPARISON:  April 06, 2022   FINDINGS: There is no evidence of lumbar spine fracture. Scoliosis. Moderate narrow intervertebral spaces are noted throughout the lumbar spine more prominently involving the L2-3, L5-S1  levels. Minimal anterior spurring noted throughout the lumbar spine. Mild facet joint sclerosis noted throughout lumbar spine.   IMPRESSION: Degenerative joint changes of lumbar spine.   Electronically Signed   By: Craig Farr M.D.   On: 11/21/2023 10:21  Date of study: 11/22/2023 Exam: DUAL X-RAY ABSORPTIOMETRY (DXA) FOR BONE MINERAL DENSITY (BMD) Instrument: [employer] Requesting Provider: PCP Indication: follow up for low BMD Comparison: none (please note that it is not possible to compare data from different instruments) Clinical data: Pt is a 83 y.o. female with previous history of hip and elbow fracture. On calcium  and vitamin D .   Results:   Lumbar spine L1-L4 (L2, L3) Femoral neck (FN) 33% distal radius Ultra distal radius  T-score -0.4 RFN: -1.5 LFN: n/a -1.2 -2.2      PATIENT SURVEYS:  Modified Oswestry: 9/50 = 18% disability  Minimally Clinically Important Difference (MCID) = 12.8%  SCREENING FOR RED FLAGS: Bowel or bladder incontinence: No Spinal tumors: No Cauda equina syndrome: No Compression fracture: No Abdominal aneurysm: No  COGNITION:  Overall cognitive status: Within functional limits for tasks assessed    SENSATION: Proprioception: Impaired , decreased sensation bilateral feet and at times up to mid-calf due to p. neuropathy  POSTURE:  R scoliosis, equal leg lengths in supine, level pelvic landmarks  PALPATION: No significant TTP over the lumbar paraspinals, some SI jt tenderness, -no buttock tenderness  LUMBAR ROM: TBD  Active  Eval  Flexion   Extension   Right lateral flexion   Left lateral flexion   Right rotation   Left rotation   (Blank rows = not tested)  MUSCLE LENGTH: Hamstrings: Right SLR 80 deg; Left 70 deg Thomas test: Right NT deg; Left NT deg Hamstrings: 90/90 LLE 70, RLE 80 ITB: NT on L due THA history; R is WNL Piriformis: WNL BLE  Hip flexors: NT  Quads: NT Heelcord: NT  LOWER EXTREMITY ROM:      Active  Right eval Left eval  Hip flexion    Hip extension 40 deg 50 deg  Hip abduction    Hip adduction    Hip internal rotation 40 30  Hip external rotation 80 70  Knee flexion 140+ 140+  Knee extension 0 0  Ankle dorsiflexion 5 10  Ankle plantarflexion    Ankle inversion    Ankle eversion    (Blank rows = not tested)  LOWER EXTREMITY MMT:    MMT Right eval Left eval  Hip flexion 4- 4-  Hip extension 4- 4-  Hip abduction 4- 4  Hip adduction 4- 4-  Hip internal rotation 4- 4  Hip external rotation 4+ 5  Knee flexion 5 5  Knee extension 5 5  Ankle dorsiflexion 4- 4+  Ankle plantarflexion    Ankle inversion 4- 4-  Ankle eversion NT NT   (Blank rows = not tested)  LUMBAR SPECIAL TESTS:  Straight leg raise test: Negative, Slump test: Negative, and FABER test: Negative  FUNCTIONAL TESTS:  Functional gait assessment: 19 GAIT: Distance walked: into clinic from parking lot  Assistive device utilized: None Level of assistance: Complete Independence Gait  pattern: R scoliosis Comments: decreased trunk rotation and arm swing, short steps with decreased heel strike   TODAY'S TREATMENT:   SELF CARE: Provided education to reduce fall risk, to facilitate performance of basic household cleaning/chores, and safety with HEP performance by utilizing corner space of home with chair in front.    PATIENT EDUCATION:  Education details: PT eval findings, anticipated POC, initial HEP, and posture and body mechanics for typical daily postioning, mobility and household tasks  Person educated: Patient Education method: Explanation, Demonstration, Verbal cues, Tactile cues, Handouts, and MedBridgeGO app access provided Education comprehension: verbalized understanding, verbal cues required, tactile cues required, and needs further education  HOME EXERCISE PROGRAM: Access Code: [email or web address] URL: [email or web address] Date: 12/05/2023 Prepared by: Garnette Montclair  Exercises - Seated Hamstring Stretch  - 1 x daily - 7 x weekly - 1 sets - 2 reps - 1 min hold - Standing Heel Raise  - 1 x daily - 7 x weekly - 1 sets - 20 reps - Standing Toe Raises at Chair  - 1 x daily - 7 x weekly - 1 sets - 20 reps - Tandem Stance  - 1 x daily - 7 x weekly - 1 sets - 1 reps - 1 min hold - Single Leg Stance  - 1 x daily - 7 x weekly - 1 sets - 3 reps - 30 sec hold   ASSESSMENT:  CLINICAL IMPRESSION: Clovis Warwick is a 83 y.o. female who was referred to physical therapy for evaluation and treatment for LBP and balance deficits.     Patient reports onset of LBP pain beginning over recent 1-2 yrs. Pain is worse with doing any lifting activities.  Patient has deficits in lumbar and hip ROM, B LE flexibility, BLE strength, abnormal posture, with abnormal muscle tension in B hamstrings which are interfering with ADLs and are impacting quality of life.  On Modified Oswestry patient scored 9/50 demonstrating 18% disability. Functional Gait assessment score is 19 indicating a high fall risk.   Enrika will benefit from skilled PT to address above deficits to improve mobility and activity tolerance with decreased pain interference.   She is agreeable to the POC  OBJECTIVE IMPAIRMENTS: Abnormal gait, decreased balance, difficulty walking, decreased ROM, decreased strength, impaired flexibility, and pain.   ACTIVITY LIMITATIONS: carrying, lifting, bending, and locomotion level  PARTICIPATION LIMITATIONS: cleaning, laundry, shopping, and yard work  PERSONAL FACTORS: Age, Time since onset of injury/illness/exacerbation, and 1-2 comorbidities: DM 2 with neuropathy, nonischemic cardiomyopathy, osteopenia, h/o L breast CA 2018, HTN, HLD, h/o RLE DVT  3 yrs ago, falls are also affecting patient's functional outcome.   REHAB POTENTIAL: Good  CLINICAL DECISION MAKING: Evolving/moderate complexity  EVALUATION COMPLEXITY: Moderate   GOALS: Goals reviewed with patient?  Yes  SHORT TERM GOALS: Target date: 01/02/2024  Patient will be independent with initial HEP to improve outcomes and carryover.  Baseline: 100% PT assistance required for correct completion  Goal status: INITIAL  2.  Patient will report 25% improvement in low back pain to improve QOL. Baseline: 6/10 worst Goal status: INITIAL   LONG TERM GOALS: Target date: 01/30/2024  Patient will be independent with ongoing/advanced HEP for self-management at home.  Baseline: no advanced HEP yet  Goal status: INITIAL   2.  Patient will report 50-75% improvement in low back pain to improve QOL.  Baseline: 6/10 worst Goal status: INITIAL  3.  Patient to demonstrate improvement in functional gait assessment to 25 to reduce fall risk in the  community Baseline: 19 Goal status: INITIAL  4.  Patient will report no falls with 50% subjective improvement in steadiness/balance  Baseline: has fallen once/month in last month  Goal status: INITIAL  5.  Patient will demonstrate improved BLE strength to >/= 4+/5 for improved stability and ease of mobility. Baseline: Refer to above LE MMT table Goal status: INITIAL  6. Patient will report </= 5% on Modified Oswestry (MCID = 12%) to demonstrate improved functional ability with decreased pain interference. Baseline: 9 Goal status: INITIAL  PLAN:  PT FREQUENCY: 2x/week  PT DURATION: 8 weeks  PLANNED INTERVENTIONS: 97164- PT Re-evaluation, 97750- Physical Performance Testing, 97110-Therapeutic exercises, 97530- Therapeutic activity, [id number]- Neuromuscular re-education, 97535- Self Care, [id number]- Manual therapy, [phone]- Gait training, [phone]- Electrical stimulation (unattended), 97035- Ultrasound, [id number]- Traction (mechanical), Patient/Family education, Balance training, Stair training, Joint mobilization, Spinal mobilization, Cryotherapy, Moist heat, and Biofeedback  PLAN FOR NEXT SESSION:Assess lumbar ROM;  Review HEP, progress higher level balance activities as  able; BLE strengthening/flexibility   Kamarah Bilotta, PT 12/05/2023, 9:14 PM

## 2023-12-05 ENCOUNTER — Ambulatory Visit: Attending: Family Medicine | Admitting: Rehabilitation

## 2023-12-05 ENCOUNTER — Other Ambulatory Visit: Payer: Self-pay

## 2023-12-05 DIAGNOSIS — R42 Dizziness and giddiness: Secondary | ICD-10-CM | POA: Insufficient documentation

## 2023-12-05 DIAGNOSIS — M5459 Other low back pain: Secondary | ICD-10-CM | POA: Diagnosis not present

## 2023-12-05 DIAGNOSIS — M25652 Stiffness of left hip, not elsewhere classified: Secondary | ICD-10-CM | POA: Insufficient documentation

## 2023-12-05 DIAGNOSIS — M6281 Muscle weakness (generalized): Secondary | ICD-10-CM | POA: Diagnosis not present

## 2023-12-05 DIAGNOSIS — G8929 Other chronic pain: Secondary | ICD-10-CM | POA: Insufficient documentation

## 2023-12-05 DIAGNOSIS — Z7409 Other reduced mobility: Secondary | ICD-10-CM | POA: Insufficient documentation

## 2023-12-05 DIAGNOSIS — Z9181 History of falling: Secondary | ICD-10-CM | POA: Insufficient documentation

## 2023-12-05 DIAGNOSIS — M545 Low back pain, unspecified: Secondary | ICD-10-CM | POA: Diagnosis not present

## 2023-12-05 DIAGNOSIS — R2681 Unsteadiness on feet: Secondary | ICD-10-CM | POA: Diagnosis not present

## 2023-12-05 DIAGNOSIS — M25651 Stiffness of right hip, not elsewhere classified: Secondary | ICD-10-CM | POA: Insufficient documentation

## 2023-12-06 ENCOUNTER — Telehealth (HOSPITAL_BASED_OUTPATIENT_CLINIC_OR_DEPARTMENT_OTHER): Payer: Self-pay

## 2023-12-13 ENCOUNTER — Other Ambulatory Visit (INDEPENDENT_AMBULATORY_CARE_PROVIDER_SITE_OTHER)

## 2023-12-13 ENCOUNTER — Ambulatory Visit (INDEPENDENT_AMBULATORY_CARE_PROVIDER_SITE_OTHER)
Admission: RE | Admit: 2023-12-13 | Discharge: 2023-12-13 | Disposition: A | Discharge status: Home / Self Care | Source: Ambulatory Visit | Attending: Gastroenterology | Admitting: Gastroenterology

## 2023-12-13 ENCOUNTER — Encounter: Payer: Self-pay | Admitting: Gastroenterology

## 2023-12-13 ENCOUNTER — Ambulatory Visit (INDEPENDENT_AMBULATORY_CARE_PROVIDER_SITE_OTHER): Admitting: Gastroenterology

## 2023-12-13 VITALS — BP 144/74 | HR 62 | Ht 68.0 in | Wt 164.2 lb

## 2023-12-13 DIAGNOSIS — R152 Fecal urgency: Secondary | ICD-10-CM

## 2023-12-13 DIAGNOSIS — K529 Noninfective gastroenteritis and colitis, unspecified: Secondary | ICD-10-CM | POA: Diagnosis not present

## 2023-12-13 DIAGNOSIS — R159 Full incontinence of feces: Secondary | ICD-10-CM

## 2023-12-13 DIAGNOSIS — R140 Abdominal distension (gaseous): Secondary | ICD-10-CM | POA: Diagnosis not present

## 2023-12-13 LAB — C-REACTIVE PROTEIN: CRP: <1 mg/dL (ref 0.5–20.0)

## 2023-12-13 NOTE — Patient Instructions (Addendum)
 OTC imodium prn Start psyllium husk  1 tsp po daily over the counter  Avoid food triggers  Your provider has requested that you have an abdominal x ray before leaving today. Please go to the basement floor to our Radiology department for the test.  Your provider has requested that you go to the basement level for lab work before leaving today. Press B on the elevator. The lab is located at the first door on the left as you exit the elevator.  _______________________________________________________  If your blood pressure at your visit was 140/90 or greater, please contact your primary care physician to follow up on this.  _______________________________________________________  If you are age 81 or older, your body mass index should be between 23-30. Your Body mass index is 24.97 kg/m. If this is out of the aforementioned range listed, please consider follow up with your Primary Care Provider.  If you are age 62 or younger, your body mass index should be between 19-25. Your Body mass index is 24.97 kg/m. If this is out of the aformentioned range listed, please consider follow up with your Primary Care Provider.   ________________________________________________________  The [facility] providers would like to encourage you to use MYCHART to communicate with providers for non-urgent requests or questions.  Due to long hold times on the telephone, sending your provider a message by [facility] may be a faster and more efficient way to get a response.  Please allow 48 business hours for a response.  Please remember that this is for non-urgent requests.  _______________________________________________________  Thank you for trusting me with your gastrointestinal care. Deanna May, RNP

## 2023-12-13 NOTE — Progress Notes (Signed)
 Chief Complaint:chronic diarrhea Primary GI Doctor: Dr. Federico  HPI:  Patient is a  83  year old female patient with past medical history of DVT (not on blood thinner), Breast cancer (2017), GERD, diabetes, depression, and anxiety, who was referred to me by Candise Aleene DEL, MD on 05/09/23 for a complaint of chronic diarrhea. Interval History    Patient presents for evaluation of intermittent chronic diarrhea she reports she has had several years. She reports she has 2-3 times a week where she has semi formed stools that are urgent. She does note the stools oily.  No blood in stool. Occasionally has cramping. She takes 2 gel caps Imodium when she has the diarrhea and it helps for a few days. She does have some stool leakage and has had accident. The other days she has 1 regular bowel movement. She reports history of constipation , but reports it has been several years. At one time she thought it was her metformin  so they switched her to the glimepiride  which did not reduce the diarrhea.   She reports corn, citrus, tomatoes, and beets make the diarrhea worse.   She does admit she's type A personality and tends to be anxious person. She is still working.    Nonsmoker. She drinks 3 glasses of white wine per night .   No blood thinners.   Patients last colonoscopy in [place] approximately 10 years ago - she reports normal. She had one 8 years before prior to that and also normal.  Patients last EGD 10/2013 with [facility] for dyspepsia.   Surgical history: hysterectomy partial, vaginal birth  Patient's family history includes: personal history of Breast CA.  Wt Readings from Last 3 Encounters:  12/13/23 164 lb 4 oz (74.5 kg)  11/16/23 167 lb 9.6 oz (76 kg)  08/22/23 165 lb 9.6 oz (75.1 kg)    Past Medical History:  Diagnosis Date   Acute DVT (deep venous thrombosis) (HCC) 04/23/2019   Right popliteal   Alcoholism (HCC)    Anxiety and depression 1964   oncologist started  duloxetine  12/2016   Breast cancer (HCC) 03/14/2016   Clinical stage 2A: (triple neg): Right breast, upper inner quadrant, 03/2016.  Neoadjuvant chemo x 5 cycles,lumpectomy 4 mo later, then RT started 10/2016.  Adjuvant Xeloda  8-10,2018.  SWOG research trial pt 04/2017--pt randomized to pembrolizumab  immunotherapy.  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  A1c Mar 12, 2018 was 7.0% at Dr. Karoline.    Epidermoid cyst of vulva    Chronic epidermoid cyst of the vulva.  Excision done 02/2019   GERD (gastroesophageal reflux disease) 2013   History of basal cell cancer    L ankle   Hyperlipidemia 1986   Hypertension 2008   2022 ->addition of hctz to lisinopril  led to 25ml drop in GFR. HCTZ d/cd.   Hypothyroidism 1988   Diagnosed in her 20s.  Managed by Endocrinologist   Lumbar radiculopathy 04/2019   Dr. Claudene to get plain films of LB and hip (considering MRI due to her  hx of cancer)   Nonischemic cardiomyopathy (HCC)    Hx of takotsubo CM   Osteoporosis 2015   pt states osteopenia, but then says that she refused to take the rx med for this condition, so I suspect she had osteoporosis.   Peripheral neuropathy 2017   Patient states diabetic neuropathy in feet prior to starting chemotherapy and then worsened by chemo.    SCC (squamous cell carcinoma), arm, right    Syncope and collapse    05/2021 while in [place] .  No prodrome.  Monitor ordered and cardiology referral ordered 05/14/21   TIA (transient ischemic attack) 03/26/2011   2012: question of (HA  + R eye floaters). CT in ED neg acute. Not admitted, no f/u testing done.  ?ocular migraine?    Past Surgical History:  Procedure Laterality Date   ABDOMINAL HYSTERECTOMY  1972   APPENDECTOMY  1972   BREAST ENHANCEMENT SURGERY  1982   BREAST IMPLANT REMOVAL Right 09/13/2016   Procedure: REMOVAL RIGHT BREAST IMPLANT;  Surgeon: Earlis Ranks, MD;  Location: [facility];  Service: Plastics;  Laterality: Right;   BREAST LUMPECTOMY WITH RADIOACTIVE SEED AND SENTINEL LYMPH NODE BIOPSY Right 09/13/2016   Procedure: RIGHT BREAST LUMPECTOMY WITH RADIOACTIVE SEED X 2 AND SENTINEL LYMPH NODE BIOPSY;  Surgeon: Alm Angle, MD;  Location: [facility];  Service: General;  Laterality: Right;   BREAST SURGERY Right 03/14/2016   Biopsy   CAPSULECTOMY Right 09/13/2016   Procedure: RIGHT CAPSULECTOMY;  Surgeon: Earlis Ranks, MD;  Location: [facility];  Service: Plastics;  Laterality: Right;   CATARACT EXTRACTION, BILATERAL Bilateral 08/10/17 right eye, 08/31/17 left eye   MASS EXCISION Left 02/28/2018   Path: benign.  Procedure: EXCISIONLEFT MEDIAL THIGH MASS ERAS PATHWAY;  Surgeon: Vanderbilt Ned, MD;  Location: [facility];  Service: General;  Laterality: Left;   PORTACATH PLACEMENT N/A 03/15/2016   Procedure: INSERTION PORT-A-CATH WITH US ;  Surgeon: Alm Angle, MD;  Location: [facility];  Service: General;  Laterality: N/A;   PORTACATH REMOVAL  07/2017   SLEEP STUDY  09/2021   NO SLEEP APNEA   surgical repair left ankle Left 2009   s/p Fall    TONSILLECTOMY AND ADENOIDECTOMY  1948   Age 54   TOTAL HIP ARTHROPLASTY Left 10/06/2021   Procedure: TOTAL HIP ARTHROPLASTY ANTERIOR APPROACH;  Surgeon: Barbarann Oneil BROCKS, MD;  Location: [facility];  Service: Orthopedics;  Laterality: Left;   US  CAROTID DOPPLER BILATERAL ([facility] HX)  01/2021   <50% bilat int carotid sten, otherwise normal.   Zio monitoring     06/2021; sinus, some mild brady, brief SVT, no  signif PVCs    Current Outpatient Medications  Medication Sig Dispense Refill   atorvastatin  (LIPITOR ) 80 MG tablet Take 1 tablet (80 mg total) by mouth daily. 90 tablet 3   carvedilol  (COREG ) 3.125 MG tablet Take 1 tablet (3.125 mg total) by mouth 2 (two) times daily with a meal. 180 tablet 0   cholecalciferol  (VITAMIN D3) 25 MCG (1000 UNIT) tablet Take 1,000 Units by mouth daily.     DULoxetine  (CYMBALTA ) 60 MG capsule Take 1 capsule (60 mg total) by mouth daily. 30 capsule 2   felodipine  (PLENDIL ) 10 MG 24 hr tablet Take 1 tablet (10 mg total) by mouth daily. 90 tablet 0   folic acid  (FOLVITE ) 1 MG tablet Take 1 tablet (1 mg total) by mouth daily.     glimepiride  (AMARYL ) 1 MG tablet Take 1 tablet (  1 mg total) by mouth daily with breakfast. 90 tablet 3   levothyroxine  (SYNTHROID ) 112 MCG tablet Take 1 tablet (112 mcg total) by mouth daily. 30 tablet 3   Multiple Vitamin (MULTIVITAMIN WITH MINERALS) TABS tablet Take 1 tablet by mouth daily.     Propylene Glycol (SYSTANE BALANCE OP) Place 1 drop into both eyes daily as needed (for dry eyes).     thiamine  100 MG tablet Take 1 tablet (100 mg total) by mouth daily.     traZODone  (DESYREL ) 50 MG tablet Take 1 tablet (50 mg total) by mouth at bedtime. 30 tablet 2   valsartan  (DIOVAN ) 160 MG tablet Take 1 tablet (160 mg total) by mouth daily. 90 tablet 1   No current facility-administered medications for this visit.    Allergies as of 12/13/2023 - Review Complete 12/13/2023  Allergen Reaction Noted   Other Other (See Comments) 10/29/2013   Prednisone Other (See Comments) 09/21/2020   Amlodipine  Other (See Comments) 07/13/2021   Hydrochlorothiazide  Other (See Comments) 07/13/2021   Penicillins Other (See Comments) 03/22/2011    Family History  Problem Relation Age of Onset   Stroke Mother    Suicidality Father    Stroke Brother    Stroke Son    Sleep apnea Son     Review of Systems:    Constitutional: No weight loss, fever, chills,  weakness or fatigue HEENT: Eyes: No change in vision               Ears, Nose, Throat:  No change in hearing or congestion Skin: No rash or itching Cardiovascular: No chest pain, chest pressure or palpitations   Respiratory: No SOB or cough Gastrointestinal: See HPI and otherwise negative Genitourinary: No dysuria or change in urinary frequency Neurological: No headache, dizziness or syncope Musculoskeletal: No new muscle or joint pain Hematologic: No bleeding or bruising Psychiatric: No history of depression or anxiety    Physical Exam:  Vital signs: BP (!) 144/74 (BP Location: Left Arm, Patient Position: Sitting, Cuff Size: Normal)   Pulse 62   Ht 5' 8 (1.727 m)   Wt 164 lb 4 oz (74.5 kg)   BMI 24.97 kg/m   Constitutional:   Pleasant  female appears to be in NAD, Well developed, Well nourished, alert and cooperative. Throat: Oral cavity and pharynx without inflammation, swelling or lesion.  Respiratory: Respirations even and unlabored. Lungs clear to auscultation bilaterally.   No wheezes, crackles, or rhonchi.  Cardiovascular: Normal S1, S2. Regular rate and rhythm. No peripheral edema, cyanosis or pallor.  Gastrointestinal:  Soft, nondistended, nontender. No rebound or guarding. Normal bowel sounds. No appreciable masses or hepatomegaly. Rectal:  Not performed.  Msk:  Symmetrical without gross deformities. Without edema, no deformity or joint abnormality.  Neurologic:  Alert and  oriented x4;  grossly normal neurologically.  Skin:   Dry and intact without significant lesions or rashes. Psychiatric: Oriented to person, place and time. Demonstrates good judgement and reason without abnormal affect or behaviors.  RELEVANT LABS AND IMAGING: CBC    Latest Ref Rng & Units 04/20/2023    2:20 PM 05/02/2022   11:28 AM 10/29/2021   11:21 AM  CBC  WBC 4.0 - 10.5 K/uL 6.8  4.2  5.5   Hemoglobin 12.0 - 15.0 g/dL 86.4  87.0  88.7   Hematocrit 36.0 - 46.0 % 41.0  37.7  33.9    Platelets 150.0 - 400.0 K/uL 219.0  171  252      CMP  Latest Ref Rng & Units 04/20/2023    2:20 PM 09/27/2022   11:57 AM 05/02/2022   11:28 AM  CMP  Glucose 70 - 99 mg/dL 897  864  724   BUN 6 - 23 mg/dL 22  17  24    Creatinine 0.40 - 1.20 mg/dL 9.01  9.15  8.99   Sodium 135 - 145 mEq/L 141  138  135   Potassium 3.5 - 5.1 mEq/L 4.5  4.6  5.3   Chloride 96 - 112 mEq/L 105  100  99   CO2 19 - 32 mEq/L 26  27  28    Calcium  8.4 - 10.5 mg/dL 9.7  9.8  89.9   Total Protein 6.0 - 8.3 g/dL 7.4   7.4   Total Bilirubin 0.2 - 1.2 mg/dL 1.0   1.8   Alkaline Phos 39 - 117 U/L 67   70   AST 0 - 37 U/L 24   22   ALT 0 - 35 U/L 26   29      Lab Results  Component Value Date   TSH 4.17 08/17/2023  05/2016 echo- The estimated ejection fraction was in the range of 55% to 60%.  04/2023 stool tests negative - cdiff, salmonella, campylobacter, ecoli  AAA duplex test - pending Assessment: Encounter Diagnoses  Name Primary?   Chronic diarrhea Yes   Incontinence of feces with fecal urgency        83 year old female patient with chronic diarrhea, urgency and incontinence. We reviewed her medications and she is not on any new medications. She is on Valsarten, a  Angiotensin Receptor Blocker (ARB), which has been associated with gastrointestinal issues, including duodenitis (inflammation of the duodenum) in some cases. Will check lab tests to check celiac panel and inflammatory marker r/o celiac disease and inflammatory disease. Will also check stool elastase to r/o epi with hx of Diabetes. Given her history of constipation and stool leakage will order abd xray 2 view to rule out obstipation. Will have her start psyllium husk 1 tsp po daily. If negative workup with no movement we discussed doing colonoscopy with random biopsies to r/o colitis.   Plan: - Check CRP, TTG IgA, IgA - Order stool elastase  - Start OTC imodium prn  -Start psyllium husk 1 tsp po daily - Order abd xray 2 view  -Avoid  food triggers -consider colonoscopy with random biopsies to r/o microscopic colitis  Thank you for the courtesy of this consult. Please call me with any questions or concerns.   Kathren Scearce, FNP-C [facility] 12/13/2023, 3:46 PM  Cc: McGowen, Aleene DEL, MD

## 2023-12-14 ENCOUNTER — Other Ambulatory Visit: Payer: Self-pay

## 2023-12-14 ENCOUNTER — Ambulatory Visit: Payer: Self-pay | Admitting: Rehabilitation

## 2023-12-14 ENCOUNTER — Ambulatory Visit: Admitting: Rehabilitation

## 2023-12-14 ENCOUNTER — Encounter: Payer: Self-pay | Admitting: Rehabilitation

## 2023-12-14 DIAGNOSIS — Z9181 History of falling: Secondary | ICD-10-CM | POA: Diagnosis not present

## 2023-12-14 DIAGNOSIS — M6281 Muscle weakness (generalized): Secondary | ICD-10-CM

## 2023-12-14 DIAGNOSIS — R42 Dizziness and giddiness: Secondary | ICD-10-CM

## 2023-12-14 DIAGNOSIS — M5459 Other low back pain: Secondary | ICD-10-CM | POA: Diagnosis not present

## 2023-12-14 DIAGNOSIS — M25651 Stiffness of right hip, not elsewhere classified: Secondary | ICD-10-CM

## 2023-12-14 DIAGNOSIS — M25652 Stiffness of left hip, not elsewhere classified: Secondary | ICD-10-CM | POA: Diagnosis not present

## 2023-12-14 DIAGNOSIS — R2681 Unsteadiness on feet: Secondary | ICD-10-CM

## 2023-12-14 LAB — TISSUE TRANSGLUTAMINASE ABS,IGG,IGA
(tTG) Ab, IgA: <1 U/mL
(tTG) Ab, IgG: <1 U/mL

## 2023-12-14 LAB — IGA: Immunoglobulin A: 178 mg/dL (ref 70–320)

## 2023-12-14 MED ORDER — LEVOTHYROXINE SODIUM 112 MCG PO TABS: 112.0000 ug | ORAL_TABLET | Freq: Every day | ORAL | 5 refills | Status: DC

## 2023-12-14 NOTE — Progress Notes (Signed)
 I agree with the assessment and plan as outlined by Ms. May.

## 2023-12-14 NOTE — Therapy (Signed)
 OUTPATIENT PHYSICAL THERAPY THORACOLUMBAR EVALUATION   Patient Name: Marissa Hall MRN: [id number] DOB:June 10, 1940, 83 y.o., female Today's Date: 12/14/2023  END OF SESSION:    Past Medical History:  Diagnosis Date   Acute DVT (deep venous thrombosis) (HCC) 04/23/2019   Right popliteal   Alcoholism (HCC)    Anxiety and depression 1964   oncologist started duloxetine  12/2016   Breast cancer (HCC) 03/14/2016   Clinical stage 2A: (triple neg): Right breast, upper inner quadrant, 03/2016.  Neoadjuvant chemo x 5 cycles,lumpectomy 4 mo later, then RT started 10/2016.  Adjuvant Xeloda  8-10,2018.  SWOG research trial pt 04/2017--pt randomized to pembrolizumab  immunotherapy.  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  A1c Mar 12, 2018 was 7.0% at Dr. Karoline.    Epidermoid cyst of vulva    Chronic epidermoid cyst of the vulva.  Excision done 02/2019   GERD (gastroesophageal reflux disease) 2013   History of basal cell cancer    L ankle   Hyperlipidemia 1986   Hypertension 2008   2022 ->addition of hctz to lisinopril  led to 25ml drop in GFR. HCTZ d/cd.   Hypothyroidism 1988   Diagnosed in her 94s.  Managed by Endocrinologist   Lumbar radiculopathy 04/2019   Dr. Claudene to get plain films of LB and hip (considering MRI due to her hx of cancer)   Nonischemic cardiomyopathy (HCC)    Hx of takotsubo CM   Osteoporosis 2015   pt states osteopenia, but then says that she refused to take the rx med for this condition, so I suspect she had osteoporosis.    Peripheral neuropathy 2017   Patient states diabetic neuropathy in feet prior to starting chemotherapy and then worsened by chemo.    SCC (squamous cell carcinoma), arm, right    Syncope and collapse    05/2021 while in [place] .  No prodrome.  Monitor ordered and cardiology referral ordered 05/14/21   TIA (transient ischemic attack) 03/26/2011   2012: question of (HA + R eye floaters). CT in ED neg acute. Not admitted, no f/u testing done.  ?ocular migraine?   Past Surgical History:  Procedure Laterality Date   ABDOMINAL HYSTERECTOMY  1972   APPENDECTOMY  1972   BREAST ENHANCEMENT SURGERY  1982   BREAST IMPLANT REMOVAL Right 09/13/2016   Procedure: REMOVAL RIGHT BREAST IMPLANT;  Surgeon: Earlis Ranks, MD;  Location: [facility];  Service: Plastics;  Laterality: Right;   BREAST LUMPECTOMY WITH RADIOACTIVE SEED AND SENTINEL LYMPH NODE BIOPSY Right 09/13/2016   Procedure: RIGHT BREAST LUMPECTOMY WITH RADIOACTIVE SEED X 2 AND SENTINEL LYMPH NODE BIOPSY;  Surgeon: Alm Angle, MD;  Location: [facility];  Service: General;  Laterality: Right;   BREAST SURGERY Right 03/14/2016   Biopsy   CAPSULECTOMY Right 09/13/2016   Procedure: RIGHT CAPSULECTOMY;  Surgeon: Earlis Ranks, MD;  Location: [facility];  Service: Plastics;  Laterality: Right;   CATARACT EXTRACTION, BILATERAL Bilateral 08/10/17 right eye, 08/31/17 left eye   MASS EXCISION Left 02/28/2018   Path: benign.  Procedure:  EXCISIONLEFT MEDIAL THIGH MASS ERAS PATHWAY;  Surgeon: Vanderbilt Ned, MD;  Location: [facility];  Service: General;  Laterality: Left;   PORTACATH PLACEMENT N/A 03/15/2016   Procedure: INSERTION PORT-A-CATH WITH US ;  Surgeon: Alm Angle, MD;  Location: [facility];  Service: General;  Laterality: N/A;   PORTACATH REMOVAL  07/2017   SLEEP STUDY  09/2021   NO SLEEP APNEA   surgical repair left ankle Left 2009   s/p Fall    TONSILLECTOMY AND ADENOIDECTOMY   1948   Age 20   TOTAL HIP ARTHROPLASTY Left 10/06/2021   Procedure: TOTAL HIP ARTHROPLASTY ANTERIOR APPROACH;  Surgeon: Barbarann Oneil BROCKS, MD;  Location: [facility];  Service: Orthopedics;  Laterality: Left;   US  CAROTID DOPPLER BILATERAL ([facility] HX)  01/2021   <50% bilat int carotid sten, otherwise normal.   Zio monitoring     06/2021; sinus, some mild brady, brief SVT, no signif PVCs   Patient Active Problem List   Diagnosis Date Noted   Atherosclerosis of abdominal aorta (HCC) 11/16/2023   S/P total hip arthroplasty 10/19/2021   Closed fracture of proximal end of left femur, initial encounter (HCC) 10/06/2021   Hyperkalemia 10/06/2021   Alcohol use 10/06/2021   Malnutrition of moderate degree 10/06/2021   Syncope and collapse 06/10/2021   Closed fracture of multiple ribs of right side 04/30/2021   Acquired hypothyroidism 02/23/2021   Type 2 diabetes mellitus with diabetic polyneuropathy, without long-term current use of insulin  (HCC) 02/23/2021   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   Diabetes mellitus type 2 without retinopathy (HCC) 09/21/2020   Meibomian gland dysfunction (MGD) of both eyes 09/21/2020   History of COVID-19 06/23/2020   COVID-19 virus infection 05/27/2020   Piriformis syndrome of right side 05/16/2019   Lower extremity pain, right 04/23/2019   DVT (deep venous thrombosis) (HCC) 04/23/2019   Lumbar radiculopathy, right 04/02/2019   Cataracts, bilateral 07/2017   Other long term (current) drug therapy 06/14/2017   Anxiety and depression    History of breast cancer 03/02/2017   History of therapeutic radiation 03/02/2017   Breast pain, right 10/03/2016   Type 2 diabetes mellitus (HCC) 08/23/2016   Hypertension associated with diabetes (HCC) 08/23/2016   Hypothyroidism 08/23/2016   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/04/2016   Port catheter in place 05/09/2016   Peripheral neuropathy 05/09/2016   Breast cancer of upper-inner quadrant of right female breast (HCC)  03/09/2016   Major depressive disorder, recurrent (HCC) 02/27/2014   Uncomplicated alcohol dependence (HCC) 02/19/2014   Osteoporosis 2015   GERD (gastroesophageal reflux disease) 2013   TIA (transient ischemic attack) 03/26/2011   Epidermoid cyst of vulva 2008   Hyperlipidemia associated with type 2 diabetes mellitus (HCC) 1986    PCP: Candise Aleene DEL, MD   REFERRING PROVIDER: Joane Artist RAMAN, MD   REFERRING DIAG:  M54.50,G89.29 (ICD-10-CM) - Chronic bilateral low back pain without sciatica  Z74.09 (ICD-10-CM) - Impaired functional mobility, balance, and endurance    THERAPY DIAG:  No diagnosis found.  RATIONALE FOR EVALUATION AND TREATMENT: Rehabilitation  ONSET DATE: last several years   NEXT MD VISIT: unsure   SUBJECTIVE:  SUBJECTIVE STATEMENT: Pt. Reports back feels about the same.  States has been doing her HEP some.  She denies any falls.  Goes for a echocardiogram tomorrow.  States worst pain over last 24 hours has been 3-4/10.  Taking tylenol  PRN which helps   Patient is referred to PT for LBP without sciatica and balance deficits.    Patient reports she has had LBP for a long time now.   She states it is bothersome, but not severe.  States feels like balance is bigger problem and wants to focus on this more than her back pain.  However, she definitely would like therapy for both issues.     Patient states she has had numerous falls in the past with L hip fx/hemiarthrplasty, rib fx's, etc.    She reports she has fallen once in the past month.   States she stood up and passed out.   She denies any injury with this fall.   She is having a hard time doing any yardwork, feeding her dogs/chickens, or simply walking outdoors in general.   She reports difficulty stepping over or  around objects in the home, including her dog.   She states her Back is worse with lifting and household chores, and better with lying down.   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 PAIN: Are you having pain? Yes: NPRS scale: 0/10, 6/10 worst over 24 hr period Pain location: low back centrally Pain description: sore achy, intermittent Aggravating factors: lifting, digging in garden, carrying Relieving factors: tylenol , lying down  PERTINENT HISTORY:   DM 2 with neuropathy, chemo induced neuropathy, nonischemic cardiomyopathy, h/o L breast CA 2018, HTN, HLD, h/o RLE DVT  3 yrs ago, falls  PRECAUTIONS: None  RED FLAGS: None  WEIGHT BEARING RESTRICTIONS: No  FALLS:  Has patient fallen in last 6 months? Yes. Number of falls 1  LIVING ENVIRONMENT: Lives with: lives alone Lives in: House/apartment Stairs: Yes: External: 3 steps; can reach both Has following equipment at home: Single point cane and walking stick  OCCUPATION: accountant, still working  PLOF: Independent with community mobility without device and Independent with gait, but admittedly unsteady  PATIENT GOALS: feel safer walking and have better balance   OBJECTIVE: (objective measures completed at initial evaluation unless otherwise dated)  DIAGNOSTIC FINDINGS:  CLINICAL DATA:  Low back pain for 1 month.   EXAM: LUMBAR SPINE - 2-3 VIEW   COMPARISON:  April 06, 2022   FINDINGS: There is no evidence of lumbar spine fracture. Scoliosis. Moderate narrow intervertebral spaces are noted throughout the lumbar spine more prominently involving the L2-3, L5-S1 levels. Minimal anterior spurring noted throughout the lumbar spine. Mild facet joint sclerosis noted throughout lumbar spine.   IMPRESSION: Degenerative joint changes of lumbar spine.   Electronically Signed   By: Craig Farr M.D.   On: 11/21/2023 10:21  Date of study: 11/22/2023 Exam: DUAL X-RAY ABSORPTIOMETRY (DXA) FOR BONE MINERAL DENSITY  (BMD) Instrument: [employer] Requesting Provider: PCP Indication: follow up for low BMD Comparison: none (please note that it is not possible to compare data from different instruments) Clinical data: Pt is a 83 y.o. female with previous history of hip and elbow fracture. On calcium  and vitamin D .   Results:   Lumbar spine L1-L4 (L2, L3) Femoral neck (FN) 33% distal radius Ultra distal radius  T-score -0.4 RFN: -1.5 LFN: n/a -1.2 -2.2      PATIENT SURVEYS:  Modified Oswestry: 9/50 = 18% disability  Minimally Clinically Important Difference (MCID) = 12.8%  SCREENING FOR RED FLAGS: Bowel or bladder incontinence: No Spinal tumors: No Cauda equina syndrome: No Compression fracture: No Abdominal aneurysm: No  COGNITION:  Overall cognitive status: Within functional limits for tasks assessed    SENSATION: Proprioception: Impaired , decreased sensation bilateral feet and at times up to mid-calf due to p. neuropathy  POSTURE:  R scoliosis, equal leg lengths in supine, level pelvic landmarks  PALPATION: No significant TTP over the lumbar paraspinals, some SI jt tenderness, -no buttock tenderness  LUMBAR ROM: TBD  Active  Eval  Flexion   Extension   Right lateral flexion   Left lateral flexion   Right rotation   Left rotation   (Blank rows = not tested)  MUSCLE LENGTH: Hamstrings: Right SLR 80 deg; Left 70 deg Thomas test: Right NT deg; Left NT deg Hamstrings: 90/90 LLE 70, RLE 80 ITB: NT on L due THA history; R is WNL Piriformis: WNL BLE  Hip flexors: NT  Quads: NT Heelcord: NT  LOWER EXTREMITY ROM:     Active  Right eval Left eval  Hip flexion    Hip extension 40 deg 50 deg  Hip abduction    Hip adduction    Hip internal rotation 40 30  Hip external rotation 80 70  Knee flexion 140+ 140+  Knee extension 0 0  Ankle dorsiflexion 5 10  Ankle plantarflexion    Ankle inversion    Ankle eversion    (Blank rows = not tested)  LOWER EXTREMITY MMT:     MMT Right eval Left eval  Hip flexion 4- 4-  Hip extension 4- 4-  Hip abduction 4- 4  Hip adduction 4- 4-  Hip internal rotation 4- 4  Hip external rotation 4+ 5  Knee flexion 5 5  Knee extension 5 5  Ankle dorsiflexion 4- 4+  Ankle plantarflexion    Ankle inversion 4- 4-  Ankle eversion NT NT   (Blank rows = not tested)  LUMBAR SPECIAL TESTS:  Straight leg raise test: Negative, Slump test: Negative, and FABER test: Negative  FUNCTIONAL TESTS:  Functional gait assessment: 19 GAIT: Distance walked: into clinic from parking lot  Assistive device utilized: None Level of assistance: Complete Independence Gait pattern: R scoliosis Comments: decreased trunk rotation and arm swing, short steps with decreased heel strike   TODAY'S TREATMENT:  12/14/23 Nu Step L3 x 5'  THERAPEUTIC ACTIVITIES: To improve functional performance.  Demonstration, verbal and tactile cues throughout for technique.  Supine manual SLR stretch x 1' x 2 BLE Supine cross over piriformis stretch x 1' x 3 RLE only due to L THA Supine FABER piriformis str x 1' x 3 LLE Bridging x 10 w/ 3 sec holds x 10 LTR w/ 2 sec holds x 10 B Peanut ball bridge x 10 Peanut roll outs x 20 Seated hip flexor stretch x 1' x 3 RLE only due to L THA Counter back extension x 10  NEUROMUSCULAR RE-EDUCATION: To improve balance Tandem stance x 1' x 2 BLE Tandem gait F/B at counter x 4 laps Foam standing w/ marching BLE x 1' Foam eyes closed x 1'      SELF CARE: Provided education to reduce fall risk, to facilitate performance of basic household cleaning/chores, and safety with HEP performance by utilizing corner space of home with chair in front.    PATIENT EDUCATION:  Education details: PT eval findings, anticipated POC, initial HEP, and posture and body mechanics for typical  daily postioning, mobility and household tasks  Person educated: Patient Education method: Explanation, Demonstration, Verbal cues, Tactile  cues, Handouts, and MedBridgeGO app access provided Education comprehension: verbalized understanding, verbal cues required, tactile cues required, and needs further education  HOME EXERCISE PROGRAM: Access Code: [email or web address] URL: [email or web address] Date: 12/05/2023 Prepared by: Garnette Montclair  Exercises - Seated Hamstring Stretch  - 1 x daily - 7 x weekly - 1 sets - 2 reps - 1 min hold - Standing Heel Raise  - 1 x daily - 7 x weekly - 1 sets - 20 reps - Standing Toe Raises at Chair  - 1 x daily - 7 x weekly - 1 sets - 20 reps - Tandem Stance  - 1 x daily - 7 x weekly - 1 sets - 1 reps - 1 min hold - Single Leg Stance  - 1 x daily - 7 x weekly - 1 sets - 3 reps - 30 sec hold 7/10 - Supine Bridge  - 1 x daily - 7 x weekly - 3 sets - 10 reps - Supine Lower Trunk Rotation with PLB  - 1 x daily - 7 x weekly - 3 sets - 10 reps - Standing Back Extension  - 1 x daily - 7 x weekly - 1 sets - 10 reps   ASSESSMENT:  CLINICAL IMPRESSION: Progressed stretching BLE, added lumbar strengthening exercises; progressed balance training.  Patient able to complete with manual assist and v/c for correct performance and with CGA to min assist for balance exercises.    Marissa Hall is a 83 y.o. female who was referred to physical therapy for evaluation and treatment for LBP and balance deficits.     Patient reports onset of LBP pain beginning over recent 1-2 yrs. Pain is worse with doing any lifting activities.  Patient has deficits in lumbar and hip ROM, B LE flexibility, BLE strength, abnormal posture, with abnormal muscle tension in B hamstrings which are interfering with ADLs and are impacting quality of life.  On Modified Oswestry patient scored 9/50 demonstrating 18% disability. Functional Gait assessment score is 19 indicating a high fall risk.   Fey will benefit from skilled PT to address above deficits to improve mobility and activity tolerance with decreased pain interference.    She is agreeable to the POC  OBJECTIVE IMPAIRMENTS: Abnormal gait, decreased balance, difficulty walking, decreased ROM, decreased strength, impaired flexibility, and pain.   ACTIVITY LIMITATIONS: carrying, lifting, bending, and locomotion level  PARTICIPATION LIMITATIONS: cleaning, laundry, shopping, and yard work  PERSONAL FACTORS: Age, Time since onset of injury/illness/exacerbation, and 1-2 comorbidities: DM 2 with neuropathy, nonischemic cardiomyopathy, osteopenia, h/o L breast CA 2018, HTN, HLD, h/o RLE DVT  3 yrs ago, falls are also affecting patient's functional outcome.   REHAB POTENTIAL: Good  CLINICAL DECISION MAKING: Evolving/moderate complexity  EVALUATION COMPLEXITY: Moderate   GOALS: Goals reviewed with patient? Yes  SHORT TERM GOALS: Target date: 01/02/2024  Patient will be independent with initial HEP to improve outcomes and carryover.  Baseline: 100% PT assistance required for correct completion  Goal status: INITIAL  2.  Patient will report 25% improvement in low back pain to improve QOL. Baseline: 6/10 worst Goal status: INITIAL   LONG TERM GOALS: Target date: 01/30/2024  Patient will be independent with ongoing/advanced HEP for self-management at home.  Baseline: no advanced HEP yet  Goal status: INITIAL   2.  Patient will report 50-75% improvement in low back pain to improve QOL.  Baseline: 6/10 worst Goal  status: INITIAL  3.  Patient to demonstrate improvement in functional gait assessment to 25 to reduce fall risk in the community Baseline: 19 Goal status: INITIAL  4.  Patient will report no falls with 50% subjective improvement in steadiness/balance  Baseline: has fallen once/month in last month  Goal status: INITIAL  5.  Patient will demonstrate improved BLE strength to >/= 4+/5 for improved stability and ease of mobility. Baseline: Refer to above LE MMT table Goal status: INITIAL  6. Patient will report </= 5% on Modified Oswestry (MCID =  12%) to demonstrate improved functional ability with decreased pain interference. Baseline: 9 Goal status: INITIAL  PLAN:  PT FREQUENCY: 2x/week  PT DURATION: 8 weeks  PLANNED INTERVENTIONS: [id number]- PT Re-evaluation, 97750- Physical Performance Testing, 97110-Therapeutic exercises, 97530- Therapeutic activity, [id number]- Neuromuscular re-education, 97535- Self Care, [id number]- Manual therapy, [phone]- Gait training, [phone]- Electrical stimulation (unattended), 97035- Ultrasound, [id number]- Traction (mechanical), Patient/Family education, Balance training, Stair training, Joint mobilization, Spinal mobilization, Cryotherapy, Moist heat, and Biofeedback  PLAN FOR NEXT SESSION:  progress higher level dynamic balance; assess lumbar ROM; back strengthening  Alcie Runions, PT 12/14/2023, 1:12 PM

## 2023-12-15 ENCOUNTER — Ambulatory Visit (HOSPITAL_COMMUNITY)
Admission: RE | Admit: 2023-12-15 | Discharge: 2023-12-15 | Disposition: A | Discharge status: Home / Self Care | Source: Ambulatory Visit | Attending: Family Medicine | Admitting: Family Medicine

## 2023-12-15 ENCOUNTER — Ambulatory Visit: Payer: Self-pay | Admitting: Gastroenterology

## 2023-12-15 DIAGNOSIS — I700 Atherosclerosis of aorta: Secondary | ICD-10-CM | POA: Diagnosis not present

## 2023-12-18 ENCOUNTER — Ambulatory Visit: Payer: Self-pay | Admitting: Family Medicine

## 2023-12-18 ENCOUNTER — Other Ambulatory Visit

## 2023-12-18 ENCOUNTER — Ambulatory Visit: Admitting: Family Medicine

## 2023-12-18 DIAGNOSIS — R152 Fecal urgency: Secondary | ICD-10-CM | POA: Diagnosis not present

## 2023-12-18 DIAGNOSIS — R159 Full incontinence of feces: Secondary | ICD-10-CM | POA: Diagnosis not present

## 2023-12-18 DIAGNOSIS — K529 Noninfective gastroenteritis and colitis, unspecified: Secondary | ICD-10-CM

## 2023-12-18 NOTE — Progress Notes (Deleted)
 Marissa Ileana Collet, PhD, LAT, ATC acting as a scribe for Artist Lloyd, MD.  Marissa Hall is a 83 y.o. female who presents to [facility] today for f/u LBP, imbalance, and DEXA scan review. Pt was last seen by Dr. Lloyd on 11/16/23 and was referred to PT, completing 2 visits. A DEXA scan and vasc US  AAA were ordered.   Today, pt reports ***  Dx testing: 11/22/23 DEXA scan  11/16/23 L-spine XR  Pertinent review of systems: ***  Relevant historical information: ***   Exam:  There were no vitals taken for this visit. General: Well Developed, well nourished, and in no acute distress.   MSK: ***    Lab and Radiology Results No results found for this or any previous visit (from the past 72 hours). VAS US  AAA DUPLEX Result Date: 12/15/2023 ABDOMINAL AORTA STUDY Patient Name:  Marissa Hall  Date of Exam:   12/15/2023 Medical Rec #: [id number]         Accession #:    [id number] Date of Birth: 18-Jan-1941        Patient Gender: F Patient Age:   72 years Exam Location:  [address] Procedure:      VAS US  AAA DUPLEX Referring Phys: ARTIST LLOYD --------------------------------------------------------------------------------  Indications: From Dr Corey's phone note 11/24/23: I have ordered a vascular              ultrasound to look for aortic aneurysm based on the aortic              atherosclerosis disease seen on the lumbar spine x-ray. Risk Factors: Hypertension, hyperlipidemia, Diabetes, past history of smoking.  Performing Technologist: [place] RVT  Examination Guidelines: A complete evaluation includes B-mode imaging, spectral Doppler, color Doppler, and power Doppler as needed of all accessible portions of each vessel. Bilateral testing is considered an integral part of a complete examination. Limited examinations for reoccurring indications may be performed as noted.  Abdominal Aorta Findings:  +-------------+-------+----------+----------+--------+--------+--------+ Location     AP (cm)Trans (cm)PSV (cm/s)WaveformThrombusComments +-------------+-------+----------+----------+--------+--------+--------+ Proximal     2.08   2.02      66                                 +-------------+-------+----------+----------+--------+--------+--------+ Mid          1.52   1.79      69                                 +-------------+-------+----------+----------+--------+--------+--------+ Distal       1.63   2.07      91                                 +-------------+-------+----------+----------+--------+--------+--------+ RT CIA Prox  1.1    1.2       89        biphasic                 +-------------+-------+----------+----------+--------+--------+--------+ RT EIA Distal                 115       biphasic                 +-------------+-------+----------+----------+--------+--------+--------+ LT CIA Prox  0.9    1.0  89        biphasic                 +-------------+-------+----------+----------+--------+--------+--------+ LT EIA Distal                 98        biphasic                 +-------------+-------+----------+----------+--------+--------+--------+  Summary: Abdominal Aorta: No evidence of an abdominal aortic aneurysm was visualized.  *See table(s) above for measurements and observations.  Electronically signed by Gaile New MD on 12/15/2023 at 5:12:24 PM.    Final        Assessment and Plan: 83 y.o. female with ***   PDMP not reviewed this encounter. No orders of the defined types were placed in this encounter.  No orders of the defined types were placed in this encounter.    Discussed warning signs or symptoms. Please see discharge instructions. Patient expresses understanding.   ***

## 2023-12-18 NOTE — Progress Notes (Signed)
 Vascular ultrasound shows no aorta aneurysm.  This is great news.

## 2023-12-20 ENCOUNTER — Ambulatory Visit: Admitting: Rehabilitation

## 2023-12-23 LAB — PANCREATIC ELASTASE, FECAL: Pancreatic Elastase-1, Stool: 241 ug/g (ref >200)

## 2023-12-26 ENCOUNTER — Ambulatory Visit: Admitting: Family Medicine

## 2023-12-26 ENCOUNTER — Ambulatory Visit

## 2023-12-26 DIAGNOSIS — M6281 Muscle weakness (generalized): Secondary | ICD-10-CM

## 2023-12-26 DIAGNOSIS — R2681 Unsteadiness on feet: Secondary | ICD-10-CM

## 2023-12-26 DIAGNOSIS — M25652 Stiffness of left hip, not elsewhere classified: Secondary | ICD-10-CM

## 2023-12-26 DIAGNOSIS — M25651 Stiffness of right hip, not elsewhere classified: Secondary | ICD-10-CM

## 2023-12-26 DIAGNOSIS — M5459 Other low back pain: Secondary | ICD-10-CM | POA: Diagnosis not present

## 2023-12-26 DIAGNOSIS — Z9181 History of falling: Secondary | ICD-10-CM | POA: Diagnosis not present

## 2023-12-26 NOTE — Therapy (Signed)
 OUTPATIENT PHYSICAL THERAPY THORACOLUMBAR TREATMENT   Patient Name: Marissa Hall MRN: [id number] DOB:1940-07-20, 83 y.o., female Today's Date: 12/26/2023  END OF SESSION:  PT End of Session - 12/26/23 1449     Visit Number 3    Date for PT Re-Evaluation 01/30/24    Authorization Type MCR with [employer] supplement    PT Start Time 1447    PT Stop Time 1528    PT Time Calculation (min) 41 min    Activity Tolerance Patient tolerated treatment well;No increased pain    Behavior During Therapy [facility] for tasks assessed/performed           Past Medical History:  Diagnosis Date   Acute DVT (deep venous thrombosis) (HCC) 04/23/2019   Right popliteal   Alcoholism (HCC)    Anxiety and depression 1964   oncologist started duloxetine  12/2016   Breast cancer (HCC) 03/14/2016   Clinical stage 2A: (triple neg): Right breast, upper inner quadrant, 03/2016.  Neoadjuvant chemo x 5 cycles,lumpectomy 4 mo later, then RT started 10/2016.  Adjuvant Xeloda  8-10,2018.  SWOG research trial pt 04/2017--pt randomized to pembrolizumab  immunotherapy.  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  A1c Mar 12, 2018 was 7.0% at Dr. Karoline.    Epidermoid cyst of vulva    Chronic epidermoid cyst of the vulva.  Excision done 02/2019   GERD (gastroesophageal reflux disease) 2013   History of basal cell cancer    L ankle   Hyperlipidemia 1986   Hypertension 2008   2022 ->addition of hctz to lisinopril  led to 25ml drop in GFR. HCTZ d/cd.   Hypothyroidism 1988   Diagnosed  in her 76s.  Managed by Endocrinologist   Lumbar radiculopathy 04/2019   Dr. Claudene to get plain films of LB and hip (considering MRI due to her hx of cancer)   Nonischemic cardiomyopathy (HCC)    Hx of takotsubo CM   Osteoporosis 2015   pt states osteopenia, but then says that she refused to take the rx med for this condition, so I suspect she had osteoporosis.   Peripheral neuropathy 2017   Patient states diabetic neuropathy in feet prior to starting chemotherapy and then worsened by chemo.    SCC (squamous cell carcinoma), arm, right    Syncope and collapse    05/2021 while in [place] .  No prodrome.  Monitor ordered and cardiology referral ordered 05/14/21   TIA (transient ischemic attack) 03/26/2011   2012: question of (HA + R eye floaters). CT in ED neg acute. Not admitted, no f/u testing done.  ?ocular migraine?   Past Surgical History:  Procedure Laterality Date   ABDOMINAL HYSTERECTOMY  1972   APPENDECTOMY  1972   BREAST ENHANCEMENT SURGERY  1982   BREAST IMPLANT REMOVAL Right 09/13/2016   Procedure: REMOVAL RIGHT BREAST IMPLANT;  Surgeon: Earlis Ranks, MD;  Location: [facility];  Service: Plastics;  Laterality: Right;   BREAST LUMPECTOMY WITH RADIOACTIVE SEED AND SENTINEL LYMPH NODE BIOPSY Right 09/13/2016   Procedure: RIGHT BREAST LUMPECTOMY WITH RADIOACTIVE SEED X  2 AND SENTINEL LYMPH NODE BIOPSY;  Surgeon: Alm Angle, MD;  Location: [facility];  Service: General;  Laterality: Right;   BREAST SURGERY Right 03/14/2016   Biopsy   CAPSULECTOMY Right 09/13/2016   Procedure: RIGHT CAPSULECTOMY;  Surgeon: Earlis Ranks, MD;  Location: [facility];  Service: Plastics;  Laterality: Right;   CATARACT EXTRACTION, BILATERAL Bilateral 08/10/17 right eye, 08/31/17 left eye   MASS EXCISION Left 02/28/2018   Path: benign.  Procedure: EXCISIONLEFT MEDIAL THIGH MASS ERAS PATHWAY;  Surgeon: Vanderbilt Ned, MD;  Location: [facility];  Service: General;  Laterality: Left;   PORTACATH PLACEMENT N/A 03/15/2016   Procedure: INSERTION PORT-A-CATH WITH US ;  Surgeon: Alm Angle, MD;  Location: [facility];  Service: General;  Laterality: N/A;   PORTACATH REMOVAL  07/2017   SLEEP STUDY  09/2021   NO SLEEP APNEA   surgical repair left ankle Left 2009   s/p Fall    TONSILLECTOMY AND ADENOIDECTOMY  1948   Age 57   TOTAL HIP ARTHROPLASTY Left 10/06/2021   Procedure: TOTAL HIP ARTHROPLASTY ANTERIOR APPROACH;  Surgeon: Barbarann Oneil BROCKS, MD;  Location: [facility];  Service: Orthopedics;  Laterality: Left;   US  CAROTID DOPPLER BILATERAL ([facility] HX)  01/2021   <50% bilat int carotid sten, otherwise normal.   Zio monitoring     06/2021; sinus, some mild brady, brief SVT, no signif PVCs   Patient Active Problem List   Diagnosis Date Noted   Atherosclerosis of abdominal aorta (HCC) 11/16/2023   S/P total hip arthroplasty 10/19/2021   Closed fracture of proximal end of left femur, initial encounter (HCC) 10/06/2021   Hyperkalemia 10/06/2021   Alcohol use 10/06/2021   Malnutrition of moderate degree 10/06/2021   Syncope and collapse 06/10/2021   Closed fracture of multiple ribs of right side 04/30/2021   Acquired hypothyroidism 02/23/2021   Type 2 diabetes mellitus with diabetic polyneuropathy, without long-term current use of insulin  (HCC) 02/23/2021   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   Diabetes mellitus type 2 without retinopathy (HCC) 09/21/2020   Meibomian gland dysfunction (MGD) of both eyes 09/21/2020   History of COVID-19 06/23/2020   COVID-19 virus infection 05/27/2020   Piriformis syndrome of right side 05/16/2019   Lower extremity pain, right 04/23/2019   DVT (deep venous thrombosis) (HCC) 04/23/2019   Lumbar radiculopathy, right 04/02/2019   Cataracts, bilateral 07/2017   Other long term (current) drug therapy 06/14/2017   Anxiety and depression    History of breast cancer 03/02/2017   History of therapeutic radiation  03/02/2017   Breast pain, right 10/03/2016   Type 2 diabetes mellitus (HCC) 08/23/2016   Hypertension associated with diabetes (HCC) 08/23/2016   Hypothyroidism 08/23/2016   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/04/2016   Port catheter in place 05/09/2016   Peripheral neuropathy 05/09/2016   Breast cancer of upper-inner quadrant of right female breast (HCC) 03/09/2016   Major depressive disorder, recurrent (HCC) 02/27/2014   Uncomplicated alcohol dependence (HCC) 02/19/2014   Osteoporosis 2015   GERD (gastroesophageal reflux disease) 2013   TIA (transient ischemic attack) 03/26/2011   Epidermoid cyst of vulva 2008   Hyperlipidemia associated with type 2 diabetes mellitus (HCC) 1986    PCP: Candise Aleene DEL, MD   REFERRING PROVIDER: Joane Artist RAMAN, MD   REFERRING DIAG:  M54.50,G89.29 (ICD-10-CM) - Chronic bilateral low back pain without sciatica  Z74.09 (ICD-10-CM) - Impaired functional mobility, balance, and endurance    THERAPY DIAG:  Unsteadiness on feet  Muscle  weakness (generalized)  Stiffness of left hip, not elsewhere classified  Stiffness of right hip, not elsewhere classified  Other low back pain  History of falling  RATIONALE FOR EVALUATION AND TREATMENT: Rehabilitation  ONSET DATE: last several years   NEXT MD VISIT: unsure   SUBJECTIVE:                                                                                                                                                                                                         SUBJECTIVE STATEMENT: Pt reports GI issues lately but all of her test have come back with good results  Patient is referred to PT for LBP without sciatica and balance deficits.    Patient reports she has had LBP for a long time now.   She states it is bothersome, but not severe.  States feels like balance is bigger problem and wants to focus on this more than her back pain.  However, she definitely would like therapy for  both issues.     Patient states she has had numerous falls in the past with L hip fx/hemiarthrplasty, rib fx's, etc.    She reports she has fallen once in the past month.   States she stood up and passed out.   She denies any injury with this fall.   She is having a hard time doing any yardwork, feeding her dogs/chickens, or simply walking outdoors in general.   She reports difficulty stepping over or around objects in the home, including her dog.   She states her Back is worse with lifting and household chores, and better with lying down.   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 PAIN: Are you having pain? Yes: NPRS scale: 0/10, 6/10 worst over 24 hr period Pain location: low back centrally Pain description: sore achy, intermittent Aggravating factors: lifting, digging in garden, carrying Relieving factors: tylenol , lying down  PERTINENT HISTORY:   DM 2 with neuropathy, chemo induced neuropathy, nonischemic cardiomyopathy, h/o L breast CA 2018, HTN, HLD, h/o RLE DVT  3 yrs ago, falls  PRECAUTIONS: None  RED FLAGS: None  WEIGHT BEARING RESTRICTIONS: No  FALLS:  Has patient fallen in last 6 months? Yes. Number of falls 1  LIVING ENVIRONMENT: Lives with: lives alone Lives in: House/apartment Stairs: Yes: External: 3 steps; can reach both Has following equipment at home: Single point cane and walking stick  OCCUPATION: accountant, still working  PLOF: Independent with community mobility without device and Independent with gait, but admittedly unsteady  PATIENT GOALS: feel safer walking and  have better balance   OBJECTIVE: (objective measures completed at initial evaluation unless otherwise dated)  DIAGNOSTIC FINDINGS:  CLINICAL DATA:  Low back pain for 1 month.   EXAM: LUMBAR SPINE - 2-3 VIEW   COMPARISON:  April 06, 2022   FINDINGS: There is no evidence of lumbar spine fracture. Scoliosis. Moderate narrow intervertebral spaces are noted throughout the lumbar  spine more prominently involving the L2-3, L5-S1 levels. Minimal anterior spurring noted throughout the lumbar spine. Mild facet joint sclerosis noted throughout lumbar spine.   IMPRESSION: Degenerative joint changes of lumbar spine.   Electronically Signed   By: Craig Farr M.D.   On: 11/21/2023 10:21  Date of study: 11/22/2023 Exam: DUAL X-RAY ABSORPTIOMETRY (DXA) FOR BONE MINERAL DENSITY (BMD) Instrument: [employer] Requesting Provider: PCP Indication: follow up for low BMD Comparison: none (please note that it is not possible to compare data from different instruments) Clinical data: Pt is a 83 y.o. female with previous history of hip and elbow fracture. On calcium  and vitamin D .   Results:   Lumbar spine L1-L4 (L2, L3) Femoral neck (FN) 33% distal radius Ultra distal radius  T-score -0.4 RFN: -1.5 LFN: n/a -1.2 -2.2      PATIENT SURVEYS:  Modified Oswestry: 9/50 = 18% disability  Minimally Clinically Important Difference (MCID) = 12.8%  SCREENING FOR RED FLAGS: Bowel or bladder incontinence: No Spinal tumors: No Cauda equina syndrome: No Compression fracture: No Abdominal aneurysm: No  COGNITION:  Overall cognitive status: Within functional limits for tasks assessed    SENSATION: Proprioception: Impaired , decreased sensation bilateral feet and at times up to mid-calf due to p. neuropathy  POSTURE:  R scoliosis, equal leg lengths in supine, level pelvic landmarks  PALPATION: No significant TTP over the lumbar paraspinals, some SI jt tenderness, -no buttock tenderness  LUMBAR ROM: TBD  Active  Eval  Flexion   Extension   Right lateral flexion   Left lateral flexion   Right rotation   Left rotation   (Blank rows = not tested)  MUSCLE LENGTH: Hamstrings: Right SLR 80 deg; Left 70 deg Thomas test: Right NT deg; Left NT deg Hamstrings: 90/90 LLE 70, RLE 80 ITB: NT on L due THA history; R is WNL Piriformis: WNL BLE  Hip flexors: NT  Quads:  NT Heelcord: NT  LOWER EXTREMITY ROM:     Active  Right eval Left eval  Hip flexion    Hip extension 40 deg 50 deg  Hip abduction    Hip adduction    Hip internal rotation 40 30  Hip external rotation 80 70  Knee flexion 140+ 140+  Knee extension 0 0  Ankle dorsiflexion 5 10  Ankle plantarflexion    Ankle inversion    Ankle eversion    (Blank rows = not tested)  LOWER EXTREMITY MMT:    MMT Right eval Left eval  Hip flexion 4- 4-  Hip extension 4- 4-  Hip abduction 4- 4  Hip adduction 4- 4-  Hip internal rotation 4- 4  Hip external rotation 4+ 5  Knee flexion 5 5  Knee extension 5 5  Ankle dorsiflexion 4- 4+  Ankle plantarflexion    Ankle inversion 4- 4-  Ankle eversion NT NT   (Blank rows = not tested)  LUMBAR SPECIAL TESTS:  Straight leg raise test: Negative, Slump test: Negative, and FABER test: Negative  FUNCTIONAL TESTS:  Functional gait assessment: 19 GAIT: Distance walked: into clinic from parking lot  Assistive device  utilized: None Level of assistance: Complete Independence Gait pattern: R scoliosis Comments: decreased trunk rotation and arm swing, short steps with decreased heel strike   TODAY'S TREATMENT:  12/26/23 Nustep L5x61min  Standing back extension at counter x 10  Standing heel/toe raise x 20 Standing mini squat counter 2x10 Tandem gait F/B at counter 3x Seated marching 2 x 10 2lb  Seated LAQ 2lb BLE 2 x 10 Standing toe taps different directions starting at ground, then progressing to higher objects  Sit to stand no UE support 2x10  12/14/23 Nu Step L3 x 5'  THERAPEUTIC ACTIVITIES: To improve functional performance.  Demonstration, verbal and tactile cues throughout for technique.  Supine manual SLR stretch x 1' x 2 BLE Supine cross over piriformis stretch x 1' x 3 RLE only due to L THA Supine FABER piriformis str x 1' x 3 LLE Bridging x 10 w/ 3 sec holds x 10 LTR w/ 2 sec holds x 10 B Peanut ball bridge x 10 Peanut roll outs x  20 Seated hip flexor stretch x 1' x 3 RLE only due to L THA Counter back extension x 10  NEUROMUSCULAR RE-EDUCATION: To improve balance Tandem stance x 1' x 2 BLE Tandem gait F/B at counter x 4 laps Foam standing w/ marching BLE x 1' Foam eyes closed x 1'      SELF CARE: Provided education to reduce fall risk, to facilitate performance of basic household cleaning/chores, and safety with HEP performance by utilizing corner space of home with chair in front.    PATIENT EDUCATION:  Education details: PT eval findings, anticipated POC, initial HEP, and posture and body mechanics for typical daily postioning, mobility and household tasks  Person educated: Patient Education method: Explanation, Demonstration, Verbal cues, Tactile cues, Handouts, and MedBridgeGO app access provided Education comprehension: verbalized understanding, verbal cues required, tactile cues required, and needs further education  HOME EXERCISE PROGRAM: Access Code: [email or web address] URL: [email or web address] Date: 12/05/2023 Prepared by: Garnette Montclair  Exercises - Seated Hamstring Stretch  - 1 x daily - 7 x weekly - 1 sets - 2 reps - 1 min hold - Standing Heel Raise  - 1 x daily - 7 x weekly - 1 sets - 20 reps - Standing Toe Raises at Chair  - 1 x daily - 7 x weekly - 1 sets - 20 reps - Tandem Stance  - 1 x daily - 7 x weekly - 1 sets - 1 reps - 1 min hold - Single Leg Stance  - 1 x daily - 7 x weekly - 1 sets - 3 reps - 30 sec hold 7/10 - Supine Bridge  - 1 x daily - 7 x weekly - 3 sets - 10 reps - Supine Lower Trunk Rotation with PLB  - 1 x daily - 7 x weekly - 3 sets - 10 reps - Standing Back Extension  - 1 x daily - 7 x weekly - 1 sets - 10 reps   ASSESSMENT:  CLINICAL IMPRESSION: Progressed BLE strengthening and balance activities. Pt with mild difficulty with tandem walk. She demonstrates RLE bias with squats and sit to stand requiring cues to correct weight shift. She does really well  with balance activities, will continue progressing dynamic balance maybe adding foam surface next visit.   Gari Trovato is a 83 y.o. female who was referred to physical therapy for evaluation and treatment for LBP and balance deficits.     Patient reports onset of LBP pain beginning over recent  1-2 yrs. Pain is worse with doing any lifting activities.  Patient has deficits in lumbar and hip ROM, B LE flexibility, BLE strength, abnormal posture, with abnormal muscle tension in B hamstrings which are interfering with ADLs and are impacting quality of life.  On Modified Oswestry patient scored 9/50 demonstrating 18% disability. Functional Gait assessment score is 19 indicating a high fall risk.   Donnesha will benefit from skilled PT to address above deficits to improve mobility and activity tolerance with decreased pain interference.   She is agreeable to the POC  OBJECTIVE IMPAIRMENTS: Abnormal gait, decreased balance, difficulty walking, decreased ROM, decreased strength, impaired flexibility, and pain.   ACTIVITY LIMITATIONS: carrying, lifting, bending, and locomotion level  PARTICIPATION LIMITATIONS: cleaning, laundry, shopping, and yard work  PERSONAL FACTORS: Age, Time since onset of injury/illness/exacerbation, and 1-2 comorbidities: DM 2 with neuropathy, nonischemic cardiomyopathy, osteopenia, h/o L breast CA 2018, HTN, HLD, h/o RLE DVT  3 yrs ago, falls are also affecting patient's functional outcome.   REHAB POTENTIAL: Good  CLINICAL DECISION MAKING: Evolving/moderate complexity  EVALUATION COMPLEXITY: Moderate   GOALS: Goals reviewed with patient? Yes  SHORT TERM GOALS: Target date: 01/02/2024  Patient will be independent with initial HEP to improve outcomes and carryover.  Baseline: 100% PT assistance required for correct completion  Goal status: MET- 12/26/23  2.  Patient will report 25% improvement in low back pain to improve QOL. Baseline: 6/10 worst Goal status:  INITIAL   LONG TERM GOALS: Target date: 01/30/2024  Patient will be independent with ongoing/advanced HEP for self-management at home.  Baseline: no advanced HEP yet  Goal status: INITIAL   2.  Patient will report 50-75% improvement in low back pain to improve QOL.  Baseline: 6/10 worst Goal status: INITIAL  3.  Patient to demonstrate improvement in functional gait assessment to 25 to reduce fall risk in the community Baseline: 19 Goal status: INITIAL  4.  Patient will report no falls with 50% subjective improvement in steadiness/balance  Baseline: has fallen once/month in last month  Goal status: INITIAL  5.  Patient will demonstrate improved BLE strength to >/= 4+/5 for improved stability and ease of mobility. Baseline: Refer to above LE MMT table Goal status: INITIAL  6. Patient will report </= 5% on Modified Oswestry (MCID = 12%) to demonstrate improved functional ability with decreased pain interference. Baseline: 9 Goal status: INITIAL  PLAN:  PT FREQUENCY: 2x/week  PT DURATION: 8 weeks  PLANNED INTERVENTIONS: 97164- PT Re-evaluation, 97750- Physical Performance Testing, 97110-Therapeutic exercises, 97530- Therapeutic activity, [id number]- Neuromuscular re-education, 97535- Self Care, [id number]- Manual therapy, [phone]- Gait training, [phone]- Electrical stimulation (unattended), 97035- Ultrasound, 02987- Traction (mechanical), Patient/Family education, Balance training, Stair training, Joint mobilization, Spinal mobilization, Cryotherapy, Moist heat, and Biofeedback  PLAN FOR NEXT SESSION:  progress higher level dynamic balance; assess lumbar ROM; back strengthening  Sol LITTIE Gaskins, PTA 12/26/2023, 5:17 PM

## 2023-12-26 NOTE — Progress Notes (Deleted)
   Marissa Ileana Collet, PhD, LAT, ATC acting as a scribe for Artist Lloyd, MD.  Marissa Hall is a 83 y.o. female who presents to [facility] at [facility] today for f/u LBP, imbalance, and DEXA scan review. Pt was last seen by Dr. Lloyd on 11/16/23 and was referred to PT, completing 2 visits. A DEXA scan and vasc US  AAA were ordered.   Today, pt reports ***  Dx testing: 11/22/23 DEXA scan  11/16/23 L-spine XR  Pertinent review of systems: ***  Relevant historical information: ***   Exam:  There were no vitals taken for this visit. General: Well Developed, well nourished, and in no acute distress.   MSK: ***    Lab and Radiology Results No results found for this or any previous visit (from the past 72 hours). No results found.     Assessment and Plan: 83 y.o. female with ***   PDMP not reviewed this encounter. No orders of the defined types were placed in this encounter.  No orders of the defined types were placed in this encounter.    Discussed warning signs or symptoms. Please see discharge instructions. Patient expresses understanding.   ***

## 2023-12-29 ENCOUNTER — Ambulatory Visit: Admitting: Family Medicine

## 2024-01-01 ENCOUNTER — Other Ambulatory Visit: Payer: Self-pay | Admitting: Family Medicine

## 2024-01-02 ENCOUNTER — Ambulatory Visit (INDEPENDENT_AMBULATORY_CARE_PROVIDER_SITE_OTHER): Admitting: Family Medicine

## 2024-01-02 ENCOUNTER — Ambulatory Visit: Admitting: Rehabilitation

## 2024-01-02 ENCOUNTER — Encounter: Payer: Self-pay | Admitting: Family Medicine

## 2024-01-02 VITALS — BP 148/78 | HR 64 | Ht 68.0 in | Wt 165.0 lb

## 2024-01-02 DIAGNOSIS — M810 Age-related osteoporosis without current pathological fracture: Secondary | ICD-10-CM

## 2024-01-02 DIAGNOSIS — M545 Low back pain, unspecified: Secondary | ICD-10-CM

## 2024-01-02 DIAGNOSIS — G8929 Other chronic pain: Secondary | ICD-10-CM | POA: Diagnosis not present

## 2024-01-02 NOTE — Patient Instructions (Addendum)
 Thank you for coming in today.   Continue PT and HEP  Let us  know if you would like for Dr. Joane to prescribe Fosamax.

## 2024-01-02 NOTE — Progress Notes (Signed)
   I, Leotis Batter, CMA acting as a scribe for Artist Lloyd, MD.  Marissa Hall is a 83 y.o. female who presents to [facility] at [facility] today for f/u LBP, imbalance, and DEXA scan review. Pt was last seen by Dr. Lloyd on 11/16/23 and was referred to PT, completing 2 visits. A DEXA scan and vasc US  AAA were ordered.   Today, pt reports some improvement of lower back pain, PT has been helpful. Doing HEP occasionally. Feels that balance has improved some, still very cautious about holding on the railings and using assistive walking device. Notes that BP has been better controlled.   Dx testing: 11/22/23 DEXA scan  11/16/23 L-spine XR  Pertinent review of systems: No fevers or chills  Relevant historical information: Osteoporosis.  History of fragility fractures.   Exam:  BP (!) 148/78   Pulse 64   Ht 5' 8 (1.727 m)   Wt 165 lb (74.8 kg)   SpO2 98%   BMI 25.09 kg/m  General: Well Developed, well nourished, and in no acute distress.   MSK: L-spine: Nontender to palpation decreased lumbar motion.  Normal gait.      Assessment and Plan: 83 y.o. female with chronic back and hip pain improving with PT.  Balance is also improving with PT.  Plan to continue PT home exercise program and check back as needed.  We did talk about bone density.  Her T-score is in the osteopenia range with a T-score of -2.2.  She has several fragility fractures which rounds are up to osteoporosis.  We spent some time talking about her medication options.  I think alendronate is her best option.  Reclast may be a good option as well.  We talked about side effects and benefit.  She is going to think about it and let me know.   PDMP not reviewed this encounter. No orders of the defined types were placed in this encounter.  No orders of the defined types were placed in this encounter.    Discussed warning signs or symptoms. Please see discharge instructions. Patient expresses  understanding.   The above documentation has been reviewed and is accurate and complete Artist Lloyd, M.D.

## 2024-01-03 ENCOUNTER — Ambulatory Visit: Admitting: Family Medicine

## 2024-01-04 ENCOUNTER — Encounter: Admitting: Rehabilitation

## 2024-01-05 LAB — HM DIABETES EYE EXAM

## 2024-01-10 ENCOUNTER — Ambulatory Visit: Admitting: Family Medicine

## 2024-01-10 ENCOUNTER — Ambulatory Visit: Attending: Family Medicine | Admitting: Rehabilitation

## 2024-01-10 DIAGNOSIS — M25651 Stiffness of right hip, not elsewhere classified: Secondary | ICD-10-CM | POA: Diagnosis not present

## 2024-01-10 DIAGNOSIS — R2681 Unsteadiness on feet: Secondary | ICD-10-CM | POA: Insufficient documentation

## 2024-01-10 DIAGNOSIS — M6281 Muscle weakness (generalized): Secondary | ICD-10-CM | POA: Diagnosis not present

## 2024-01-10 DIAGNOSIS — M25652 Stiffness of left hip, not elsewhere classified: Secondary | ICD-10-CM | POA: Insufficient documentation

## 2024-01-10 NOTE — Therapy (Addendum)
 OUTPATIENT PHYSICAL THERAPY THORACOLUMBAR TREATMENT /DC SUMMARY   Patient Name: Marissa Hall MRN: [id number] DOB:09/27/1940, 83 y.o., female Today's Date: 01/10/2024  END OF SESSION:  PT End of Session - 01/10/24 1546     Visit Number 4    Date for PT Re-Evaluation 01/30/24    Authorization Type MCR with [employer] supplement    PT Start Time 1541    Activity Tolerance Patient tolerated treatment well;No increased pain    Behavior During Therapy [facility] for tasks assessed/performed           Past Medical History:  Diagnosis Date   Acute DVT (deep venous thrombosis) (HCC) 04/23/2019   Right popliteal   Alcoholism (HCC)    Anxiety and depression 1964   oncologist started duloxetine  12/2016   Breast cancer (HCC) 03/14/2016   Clinical stage 2A: (triple neg): Right breast, upper inner quadrant, 03/2016.  Neoadjuvant chemo x 5 cycles,lumpectomy 4 mo later, then RT started 10/2016.  Adjuvant Xeloda  8-10,2018.  SWOG research trial pt 04/2017--pt randomized to pembrolizumab  immunotherapy.  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  A1c Mar 12, 2018 was 7.0% at Dr. Karoline.    Epidermoid cyst of vulva    Chronic epidermoid cyst of the vulva.  Excision done 02/2019   GERD (gastroesophageal reflux disease) 2013   History of basal cell cancer    L ankle   Hyperlipidemia 1986   Hypertension 2008   2022 ->addition of hctz to lisinopril  led to 25ml drop in GFR. HCTZ d/cd.   Hypothyroidism 1988   Diagnosed in her 49s.  Managed by Endocrinologist   Lumbar  radiculopathy 04/2019   Dr. Claudene to get plain films of LB and hip (considering MRI due to her hx of cancer)   Nonischemic cardiomyopathy (HCC)    Hx of takotsubo CM   Osteoporosis 2015   pt states osteopenia, but then says that she refused to take the rx med for this condition, so I suspect she had osteoporosis.   Peripheral neuropathy 2017   Patient states diabetic neuropathy in feet prior to starting chemotherapy and then worsened by chemo.    SCC (squamous cell carcinoma), arm, right    Syncope and collapse    05/2021 while in [place] .  No prodrome.  Monitor ordered and cardiology referral ordered 05/14/21   TIA (transient ischemic attack) 03/26/2011   2012: question of (HA + R eye floaters). CT in ED neg acute. Not admitted, no f/u testing done.  ?ocular migraine?   Past Surgical History:  Procedure Laterality Date   ABDOMINAL HYSTERECTOMY  1972   APPENDECTOMY  1972   BREAST ENHANCEMENT SURGERY  1982   BREAST IMPLANT REMOVAL Right 09/13/2016   Procedure: REMOVAL RIGHT BREAST IMPLANT;  Surgeon: Earlis Ranks, MD;  Location: [facility];  Service: Plastics;  Laterality: Right;   BREAST LUMPECTOMY WITH RADIOACTIVE SEED AND SENTINEL LYMPH NODE BIOPSY Right 09/13/2016   Procedure: RIGHT BREAST LUMPECTOMY WITH RADIOACTIVE SEED X 2 AND SENTINEL LYMPH NODE BIOPSY;  Surgeon: Alm Angle, MD;  Location: [facility];  Service: General;  Laterality: Right;   BREAST SURGERY Right 03/14/2016   Biopsy   CAPSULECTOMY Right 09/13/2016   Procedure: RIGHT CAPSULECTOMY;  Surgeon: Earlis Ranks, MD;  Location: [facility];  Service: Plastics;  Laterality: Right;   CATARACT EXTRACTION, BILATERAL Bilateral 08/10/17 right eye, 08/31/17 left eye   MASS EXCISION Left 02/28/2018   Path: benign.  Procedure: EXCISIONLEFT MEDIAL THIGH MASS ERAS PATHWAY;  Surgeon: Vanderbilt Ned, MD;  Location: [facility];  Service: General;  Laterality: Left;    PORTACATH PLACEMENT N/A 03/15/2016   Procedure: INSERTION PORT-A-CATH WITH US ;  Surgeon: Alm Angle, MD;  Location: [facility];  Service: General;  Laterality: N/A;   PORTACATH REMOVAL  07/2017   SLEEP STUDY  09/2021   NO SLEEP APNEA   surgical repair left ankle Left 2009   s/p Fall    TONSILLECTOMY AND ADENOIDECTOMY  1948   Age 38   TOTAL HIP ARTHROPLASTY Left 10/06/2021   Procedure: TOTAL HIP ARTHROPLASTY ANTERIOR APPROACH;  Surgeon: Barbarann Oneil BROCKS, MD;  Location: [facility];  Service: Orthopedics;  Laterality: Left;   US  CAROTID DOPPLER BILATERAL ([facility] HX)  01/2021   <50% bilat int carotid sten, otherwise normal.   Zio monitoring     06/2021; sinus, some mild brady, brief SVT, no signif PVCs   Patient Active Problem List   Diagnosis Date Noted   Atherosclerosis of abdominal aorta (HCC) 11/16/2023   S/P total hip arthroplasty 10/19/2021   Closed fracture of proximal end of left femur, initial encounter (HCC) 10/06/2021   Hyperkalemia 10/06/2021   Alcohol use 10/06/2021   Malnutrition of moderate degree 10/06/2021   Syncope and collapse 06/10/2021   Closed fracture of multiple ribs of right side 04/30/2021   Acquired hypothyroidism 02/23/2021   Type 2 diabetes mellitus with diabetic polyneuropathy, without long-term current use of insulin  (HCC) 02/23/2021   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   Diabetes mellitus type 2 without retinopathy (HCC) 09/21/2020   Meibomian gland dysfunction (MGD) of both eyes 09/21/2020   History of COVID-19 06/23/2020   COVID-19 virus infection 05/27/2020   Piriformis syndrome of right side 05/16/2019   Lower extremity pain, right 04/23/2019   DVT (deep venous thrombosis) (HCC) 04/23/2019   Lumbar radiculopathy, right 04/02/2019   Cataracts, bilateral 07/2017   Other long term (current) drug therapy 06/14/2017   Anxiety and depression    History of breast cancer 03/02/2017   History of therapeutic radiation 03/02/2017   Breast pain, right 10/03/2016   Type  2 diabetes mellitus (HCC) 08/23/2016   Hypertension associated with diabetes (HCC) 08/23/2016   Hypothyroidism 08/23/2016   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/04/2016   Port catheter in place 05/09/2016   Peripheral neuropathy 05/09/2016   Breast cancer of upper-inner quadrant of right female breast (HCC) 03/09/2016   Major depressive disorder, recurrent (HCC) 02/27/2014   Uncomplicated alcohol dependence (HCC) 02/19/2014   Osteoporosis 2015   GERD (gastroesophageal reflux disease) 2013   TIA (transient ischemic attack) 03/26/2011   Epidermoid cyst of vulva 2008   Hyperlipidemia associated with type 2 diabetes mellitus (HCC) 1986    PCP: Candise Aleene DEL, MD   REFERRING PROVIDER: Joane Artist RAMAN, MD   REFERRING DIAG:  M54.50,G89.29 (ICD-10-CM) - Chronic bilateral low back pain without sciatica  Z74.09 (ICD-10-CM) - Impaired functional mobility, balance, and endurance    THERAPY DIAG:  Unsteadiness on feet  Muscle weakness (generalized)  Stiffness of left hip, not elsewhere classified  Stiffness of right  hip, not elsewhere classified  RATIONALE FOR EVALUATION AND TREATMENT: Rehabilitation  ONSET DATE: last several years   NEXT MD VISIT: unsure   SUBJECTIVE:                                                                                                                                                                                                         SUBJECTIVE STATEMENT: 01/10/24:  0/10 pain per patient report.  States feeling better.  Denies any falls.   Patient is referred to PT for LBP without sciatica and balance deficits.    Patient reports she has had LBP for a long time now.   She states it is bothersome, but not severe.  States feels like balance is bigger problem and wants to focus on this more than her back pain.  However, she definitely would like therapy for both issues.     Patient states she has had numerous falls in the past with L hip  fx/hemiarthrplasty, rib fx's, etc.    She reports she has fallen once in the past month.   States she stood up and passed out.   She denies any injury with this fall.   She is having a hard time doing any yardwork, feeding her dogs/chickens, or simply walking outdoors in general.   She reports difficulty stepping over or around objects in the home, including her dog.   She states her Back is worse with lifting and household chores, and better with lying down.   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 PAIN: Are you having pain? Yes: NPRS scale: 0/10, 6/10 worst over 24 hr period Pain location: low back centrally Pain description: sore achy, intermittent Aggravating factors: lifting, digging in garden, carrying Relieving factors: tylenol , lying down  PERTINENT HISTORY:   DM 2 with neuropathy, chemo induced neuropathy, nonischemic cardiomyopathy, h/o L breast CA 2018, HTN, HLD, h/o RLE DVT  3 yrs ago, falls  PRECAUTIONS: None  RED FLAGS: None  WEIGHT BEARING RESTRICTIONS: No  FALLS:  Has patient fallen in last 6 months? Yes. Number of falls 1  LIVING ENVIRONMENT: Lives with: lives alone Lives in: House/apartment Stairs: Yes: External: 3 steps; can reach both Has following equipment at home: Single point cane and walking stick  OCCUPATION: accountant, still working  PLOF: Independent with community mobility without device and Independent with gait, but admittedly unsteady  PATIENT GOALS: feel safer walking and have better balance   OBJECTIVE: (objective measures completed at initial evaluation unless otherwise dated)  DIAGNOSTIC FINDINGS:  CLINICAL DATA:  Low  back pain for 1 month.   EXAM: LUMBAR SPINE - 2-3 VIEW   COMPARISON:  April 06, 2022   FINDINGS: There is no evidence of lumbar spine fracture. Scoliosis. Moderate narrow intervertebral spaces are noted throughout the lumbar spine more prominently involving the L2-3, L5-S1 levels. Minimal anterior spurring  noted throughout the lumbar spine. Mild facet joint sclerosis noted throughout lumbar spine.   IMPRESSION: Degenerative joint changes of lumbar spine.   Electronically Signed   By: Craig Farr M.D.   On: 11/21/2023 10:21  Date of study: 11/22/2023 Exam: DUAL X-RAY ABSORPTIOMETRY (DXA) FOR BONE MINERAL DENSITY (BMD) Instrument: [employer] Requesting Provider: PCP Indication: follow up for low BMD Comparison: none (please note that it is not possible to compare data from different instruments) Clinical data: Pt is a 83 y.o. female with previous history of hip and elbow fracture. On calcium  and vitamin D .   Results:   Lumbar spine L1-L4 (L2, L3) Femoral neck (FN) 33% distal radius Ultra distal radius  T-score -0.4 RFN: -1.5 LFN: n/a -1.2 -2.2      PATIENT SURVEYS:  Modified Oswestry: 9/50 = 18% disability  Minimally Clinically Important Difference (MCID) = 12.8%  SCREENING FOR RED FLAGS: Bowel or bladder incontinence: No Spinal tumors: No Cauda equina syndrome: No Compression fracture: No Abdominal aneurysm: No  COGNITION:  Overall cognitive status: Within functional limits for tasks assessed    SENSATION: Proprioception: Impaired , decreased sensation bilateral feet and at times up to mid-calf due to p. neuropathy  POSTURE:  R scoliosis, equal leg lengths in supine, level pelvic landmarks  PALPATION: No significant TTP over the lumbar paraspinals, some SI jt tenderness, -no buttock tenderness  LUMBAR ROM: TBD  Active  Eval  Flexion   Extension   Right lateral flexion   Left lateral flexion   Right rotation   Left rotation   (Blank rows = not tested)  MUSCLE LENGTH: Hamstrings: Right SLR 80 deg; Left 70 deg Thomas test: Right NT deg; Left NT deg Hamstrings: 90/90 LLE 70, RLE 80 ITB: NT on L due THA history; R is WNL Piriformis: WNL BLE  Hip flexors: NT  Quads: NT Heelcord: NT  LOWER EXTREMITY ROM:     Active  Right eval Left eval  Hip  flexion    Hip extension 40 deg 50 deg  Hip abduction    Hip adduction    Hip internal rotation 40 30  Hip external rotation 80 70  Knee flexion 140+ 140+  Knee extension 0 0  Ankle dorsiflexion 5 10  Ankle plantarflexion    Ankle inversion    Ankle eversion    (Blank rows = not tested)  LOWER EXTREMITY MMT:    MMT Right eval Left eval  Hip flexion 4- 4-  Hip extension 4- 4-  Hip abduction 4- 4  Hip adduction 4- 4-  Hip internal rotation 4- 4  Hip external rotation 4+ 5  Knee flexion 5 5  Knee extension 5 5  Ankle dorsiflexion 4- 4+  Ankle plantarflexion    Ankle inversion 4- 4-  Ankle eversion NT NT   (Blank rows = not tested)  LUMBAR SPECIAL TESTS:  Straight leg raise test: Negative, Slump test: Negative, and FABER test: Negative  FUNCTIONAL TESTS:  Functional gait assessment: 19 GAIT: Distance walked: into clinic from parking lot  Assistive device utilized: None Level of assistance: Complete Independence Gait pattern: R scoliosis Comments: decreased trunk rotation and arm swing, short steps with decreased heel  strike   TODAY'S TREATMENT:  01/10/24 THERAPEUTIC EXERCISE: To improve strength.  Demonstration, verbal and tactile cues throughout for technique. Nustep L4 x 5'  THERAPEUTIC ACTIVITIES: To improve functional performance.  Demonstration, verbal and tactile cues throughout for technique. Supine SB rollouts x 20 BLE Supine SB S/S x 20 BLE Supine SB bridge x 20 BLE Bridge with alternating marching x 3/5 BLE Bridge w/ alternating knee extension x 3/5 BLE RDL 10# KB to lowered mat table x 15 BUE Supine manual ham stretch x 1' x 2 BLE  NEUROMUSCULAR RE-EDUCATION: To improve balance. Corner toe/heel rocking x 20 BLE Corner minisquat x 20 Corner marching x 20 BLE Tandem stance x 1' BLE SLS x 1' BLE Star/clock touch x 3 BLE to dots Cone touch x 3 BLE  12/26/23 Nustep L5x55min  Standing back extension at counter x 10  Standing heel/toe raise x  20 Standing mini squat counter 2x10 Tandem gait F/B at counter 3x Seated marching 2 x 10 2lb  Seated LAQ 2lb BLE 2 x 10 Standing toe taps different directions starting at ground, then progressing to higher objects  Sit to stand no UE support 2x10  12/14/23 Nu Step L3 x 5'  THERAPEUTIC ACTIVITIES: To improve functional performance.  Demonstration, verbal and tactile cues throughout for technique.  Supine manual SLR stretch x 1' x 2 BLE Supine cross over piriformis stretch x 1' x 3 RLE only due to L THA Supine FABER piriformis str x 1' x 3 LLE Bridging x 10 w/ 3 sec holds x 10 LTR w/ 2 sec holds x 10 B Peanut ball bridge x 10 Peanut roll outs x 20 Seated hip flexor stretch x 1' x 3 RLE only due to L THA Counter back extension x 10  NEUROMUSCULAR RE-EDUCATION: To improve balance Tandem stance x 1' x 2 BLE Tandem gait F/B at counter x 4 laps Foam standing w/ marching BLE x 1' Foam eyes closed x 1'      SELF CARE: Provided education to reduce fall risk, to facilitate performance of basic household cleaning/chores, and safety with HEP performance by utilizing corner space of home with chair in front.    PATIENT EDUCATION:  Education details: PT eval findings, anticipated POC, initial HEP, and posture and body mechanics for typical daily postioning, mobility and household tasks  Person educated: Patient Education method: Explanation, Demonstration, Verbal cues, Tactile cues, Handouts, and MedBridgeGO app access provided Education comprehension: verbalized understanding, verbal cues required, tactile cues required, and needs further education  HOME EXERCISE PROGRAM: Access Code: [email or web address] URL: [email or web address] Date: 12/05/2023 Prepared by: Garnette Montclair  Exercises - Seated Hamstring Stretch  - 1 x daily - 7 x weekly - 1 sets - 2 reps - 1 min hold - Standing Heel Raise  - 1 x daily - 7 x weekly - 1 sets - 20 reps - Standing Toe Raises at Chair  - 1 x  daily - 7 x weekly - 1 sets - 20 reps - Tandem Stance  - 1 x daily - 7 x weekly - 1 sets - 1 reps - 1 min hold - Single Leg Stance  - 1 x daily - 7 x weekly - 1 sets - 3 reps - 30 sec hold 7/10 - Supine Bridge  - 1 x daily - 7 x weekly - 3 sets - 10 reps - Supine Lower Trunk Rotation with PLB  - 1 x daily - 7 x weekly - 3 sets - 10 reps - Standing  Back Extension  - 1 x daily - 7 x weekly - 1 sets - 10 reps   ASSESSMENT:  CLINICAL IMPRESSION: Patient does well with her balance activities today.  She has much improved balance over initial visit and has clearly been doing her HEP.  SLS stability is improved and she can hold it for 1' with only intermittent UE support.  Her back pain appears to have improved as well and progressed to deadlifts from lowered mat table.  Marissa Hall is a 83 y.o. female who was referred to physical therapy for evaluation and treatment for LBP and balance deficits.     Patient reports onset of LBP pain beginning over recent 1-2 yrs. Pain is worse with doing any lifting activities.  Patient has deficits in lumbar and hip ROM, B LE flexibility, BLE strength, abnormal posture, with abnormal muscle tension in B hamstrings which are interfering with ADLs and are impacting quality of life.  On Modified Oswestry patient scored 9/50 demonstrating 18% disability. Functional Gait assessment score is 19 indicating a high fall risk.   Marissa Hall will benefit from skilled PT to address above deficits to improve mobility and activity tolerance with decreased pain interference.   She is agreeable to the POC  OBJECTIVE IMPAIRMENTS: Abnormal gait, decreased balance, difficulty walking, decreased ROM, decreased strength, impaired flexibility, and pain.   ACTIVITY LIMITATIONS: carrying, lifting, bending, and locomotion level  PARTICIPATION LIMITATIONS: cleaning, laundry, shopping, and yard work  PERSONAL FACTORS: Age, Time since onset of injury/illness/exacerbation, and 1-2  comorbidities: DM 2 with neuropathy, nonischemic cardiomyopathy, osteopenia, h/o L breast CA 2018, HTN, HLD, h/o RLE DVT  3 yrs ago, falls are also affecting patient's functional outcome.   REHAB POTENTIAL: Good  CLINICAL DECISION MAKING: Evolving/moderate complexity  EVALUATION COMPLEXITY: Moderate   GOALS: Goals reviewed with patient? Yes  SHORT TERM GOALS: Target date: 01/02/2024  Patient will be independent with initial HEP to improve outcomes and carryover.  Baseline: 100% PT assistance required for correct completion  Goal status: MET- 12/26/23  2.  Patient will report 25% improvement in low back pain to improve QOL. Baseline: 6/10 worst 01/13/24 Denies pain Goal status: MET   LONG TERM GOALS: Target date: 01/30/2024  Patient will be independent with ongoing/advanced HEP for self-management at home.  Baseline: progressing HEP per visit now  Goal status: IN PROGRESS   2.  Patient will report 50-75% improvement in low back pain to improve QOL.  Baseline: 6/10 worst 01/10/24:  denies pain Goal status: MET  3.  Patient to demonstrate improvement in functional gait assessment to 25 to reduce fall risk in the community Baseline: 19 Goal status: INITIAL  4.  Patient will report no falls with 50% subjective improvement in steadiness/balance  Baseline: has fallen once/month in last month  01/10/24:  states subjectively feels better about 25%-30% with balance; no falls Goal status: IN PROGRESS  5.  Patient will demonstrate improved BLE strength to >/= 4+/5 for improved stability and ease of mobility. Baseline: Refer to above LE MMT table Goal status: INITIAL  6. Patient will report </= 5% on Modified Oswestry (MCID = 12%) to demonstrate improved functional ability with decreased pain interference. Baseline: 9 Goal status: INITIAL  PLAN:  PT FREQUENCY: 2x/week  PT DURATION: 8 weeks  PLANNED INTERVENTIONS: 97164- PT Re-evaluation, 97750- Physical Performance Testing,  97110-Therapeutic exercises, 97530- Therapeutic activity, [id number]- Neuromuscular re-education, 97535- Self Care, [id number]- Manual therapy, [id number]- Gait training, [phone]- Electrical stimulation (unattended), [id number]- Ultrasound, 02987- Traction (mechanical), Patient/Family education,  Balance training, Stair training, Joint mobilization, Spinal mobilization, Cryotherapy, Moist heat, and Biofeedback  PLAN FOR NEXT SESSION:  add more lumbar strengthening exercises; progress balance  Tyquez Hollibaugh, PT 01/10/2024, 3:49 PM   PHYSICAL THERAPY DISCHARGE SUMMARY  Visits from Start of Care: 4  Current functional level related to goals / functional outcomes: SEE ABOVE NOTE UNDER ASSESSMENTS   Remaining deficits: Patient was doing very well at the time of her last visit and had no c/o back pain and her balance was markedly improved which is unusual for only 4 PT visits.   However, she was very diligent doing her home exercises.   She has not returned to PT since her 4th visit, so we will D/C   Education / Equipment: Patient is independent with all home exercises and advised to continue daily as tolerated and call us  with any questions   Patient agrees to discharge. Patient goals were partially met. Patient is being discharged due to not returning since the last visit.

## 2024-01-12 ENCOUNTER — Other Ambulatory Visit: Payer: Self-pay

## 2024-01-12 MED ORDER — DULOXETINE HCL 60 MG PO CPEP: 60.0000 mg | ORAL_CAPSULE | Freq: Every day | ORAL | 0 refills | Status: DC

## 2024-01-16 ENCOUNTER — Ambulatory Visit: Admitting: Rehabilitation

## 2024-01-23 ENCOUNTER — Other Ambulatory Visit: Payer: Self-pay

## 2024-01-23 MED ORDER — LEVOTHYROXINE SODIUM 112 MCG PO TABS: 112.0000 ug | ORAL_TABLET | Freq: Every day | ORAL | 5 refills | Status: DC

## 2024-01-25 ENCOUNTER — Encounter: Admitting: Obstetrics and Gynecology

## 2024-02-02 ENCOUNTER — Encounter: Payer: Self-pay | Admitting: Family Medicine

## 2024-02-02 ENCOUNTER — Ambulatory Visit (INDEPENDENT_AMBULATORY_CARE_PROVIDER_SITE_OTHER): Admitting: Family Medicine

## 2024-02-02 VITALS — BP 149/79 | HR 54 | Ht 68.0 in | Wt 165.2 lb

## 2024-02-02 DIAGNOSIS — I10 Essential (primary) hypertension: Secondary | ICD-10-CM | POA: Diagnosis not present

## 2024-02-02 DIAGNOSIS — Z23 Encounter for immunization: Secondary | ICD-10-CM | POA: Diagnosis not present

## 2024-02-02 DIAGNOSIS — F3342 Major depressive disorder, recurrent, in full remission: Secondary | ICD-10-CM | POA: Diagnosis not present

## 2024-02-02 DIAGNOSIS — H6123 Impacted cerumen, bilateral: Secondary | ICD-10-CM

## 2024-02-02 DIAGNOSIS — N1831 Chronic kidney disease, stage 3a: Secondary | ICD-10-CM | POA: Diagnosis not present

## 2024-02-02 MED ORDER — CARVEDILOL 3.125 MG PO TABS: 3.1250 mg | ORAL_TABLET | Freq: Two times a day (BID) | ORAL | 1 refills | Status: AC

## 2024-02-02 MED ORDER — DULOXETINE HCL 60 MG PO CPEP: 60.0000 mg | ORAL_CAPSULE | Freq: Every day | ORAL | 1 refills | Status: DC

## 2024-02-02 MED ORDER — VALSARTAN 160 MG PO TABS: 160.0000 mg | ORAL_TABLET | Freq: Every day | ORAL | 3 refills | Status: AC

## 2024-02-02 MED ORDER — TRAZODONE HCL 50 MG PO TABS: 50.0000 mg | ORAL_TABLET | Freq: Every day | ORAL | 6 refills | Status: AC

## 2024-02-02 MED ORDER — FELODIPINE ER 10 MG PO TB24: 10.0000 mg | ORAL_TABLET | Freq: Every day | ORAL | 3 refills | Status: AC

## 2024-02-02 NOTE — Patient Instructions (Signed)
   It was very nice to see you today!   PLEASE NOTE:  If labs were collected or images ordered, we will inform you of  results once we have received them and reviewed. We will contact you either by echart message, or telephone call.  Please give ample time to the testing facility, and our office to run,  receive and review results. Please do not call inquiring of results, even if you can see them in your chart. We will contact you as soon as we are able. If it has been over 1 week since the test was completed, and you have not yet heard from us , then please call us .     - echart message- for normal results that have been seen by the patient already.   - telephone call: abnormal results or if patient has not viewed results in their echart.   If a referral to a specialist was entered for you, please call us  in 2 weeks if you have not heard from the specialist office to schedule.

## 2024-02-02 NOTE — Progress Notes (Signed)
 OFFICE VISIT  02/02/2024  CC:  Chief Complaint  Patient presents with   Medical Management of Chronic Issues    Patient is a 83 y.o. female who presents for 44-month follow-up hypertension, chronic renal insufficiency, and recurrent major depressive disorder. A/P as of last visit: #1 labile hypertension. Well-controlled lately. Continue carvedilol  3.125 mg twice daily,  felodipine  10 mg daily, valsartan  160 mg daily. I removed lisinopril  from her medication list.   #2 recurrent major depressive disorder, in remission. Continue Cymbalta  60 mg/day.  INTERIM HX: Marissa Hall says she is doing pretty well.  Home blood pressures consistently around 130/80.    Mood is stable, anxiety level stable.  Her sleep is dysfunctional but trazodone  50 mg nightly as needed does help.  She says her ears feel clogged lately.  No ear pain or drainage.  ROS as above, plus--> no fevers, no CP, no SOB, no wheezing, no cough, no dizziness, no HAs, no rashes, no melena/hematochezia.  No polyuria or polydipsia.  No myalgias or arthralgias.  No focal weakness, paresthesias, or tremors.  No vision abnormalities.  No dysuria or unusual/new urinary urgency or frequency.  No recent changes in lower legs. No n/v/d or abd pain.  No palpitations.     Past Medical History:  Diagnosis Date   Acute DVT (deep venous thrombosis) (HCC) 04/23/2019   Right popliteal   Alcoholism (HCC)    Allergy    Since a child   Anxiety and depression 1964   oncologist started duloxetine  12/2016   Breast cancer (HCC) 03/14/2016   Clinical stage 2A: (triple neg): Right breast, upper inner quadrant, 03/2016.  Neoadjuvant chemo x 5 cycles,lumpectomy 4 mo later, then RT started 10/2016.  Adjuvant Xeloda  8-10,2018.  SWOG research trial pt 04/2017--pt randomized to pembrolizumab  immunotherapy.  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  A1c Mar 12, 2018 was 7.0% at Dr. Karoline.    Epidermoid cyst of vulva    Chronic epidermoid cyst of the vulva.  Excision done 02/2019   GERD (gastroesophageal reflux disease) 2013   History of basal cell cancer    L ankle   Hyperlipidemia 1986   Hypertension 2008   2022 ->addition of hctz to lisinopril  led to 25ml drop in GFR. HCTZ d/cd.   Hypothyroidism 1988   Diagnosed in her 33s.  Managed by Endocrinologist   Lumbar radiculopathy 04/2019   Dr. Claudene to get plain films of LB and hip (considering MRI due to her hx of cancer)   Nonischemic cardiomyopathy (HCC)    Hx of takotsubo CM   Osteoporosis 2015   pt states osteopenia, but then says that she refused to take the rx med for this condition, so I suspect she had osteoporosis.   Peripheral neuropathy 2017   Patient states diabetic neuropathy in feet prior to starting chemotherapy and then worsened by chemo.    SCC (squamous cell carcinoma), arm, right    Stroke (HCC) 03/2011   Syncope and collapse    05/2021 while in [place] .  No prodrome.  Monitor ordered  and cardiology referral ordered 05/14/21   TIA (transient ischemic attack) 03/26/2011   2012: question of (HA + R eye floaters). CT in ED neg acute. Not admitted, no f/u testing done.  ?ocular migraine?    Past Surgical History:  Procedure Laterality Date   ABDOMINAL HYSTERECTOMY  1972   APPENDECTOMY  1972   BREAST ENHANCEMENT SURGERY  1982   BREAST IMPLANT REMOVAL Right 09/13/2016   Procedure: REMOVAL RIGHT BREAST IMPLANT;  Surgeon: Earlis Ranks, MD;  Location: [facility];  Service: Plastics;  Laterality: Right;    BREAST LUMPECTOMY WITH RADIOACTIVE SEED AND SENTINEL LYMPH NODE BIOPSY Right 09/13/2016   Procedure: RIGHT BREAST LUMPECTOMY WITH RADIOACTIVE SEED X 2 AND SENTINEL LYMPH NODE BIOPSY;  Surgeon: Alm Angle, MD;  Location: [facility];  Service: General;  Laterality: Right;   BREAST SURGERY Right 03/14/2016   Biopsy   CAPSULECTOMY Right 09/13/2016   Procedure: RIGHT CAPSULECTOMY;  Surgeon: Earlis Ranks, MD;  Location: [facility];  Service: Plastics;  Laterality: Right;   CARDIAC CATHETERIZATION  2011   Takotsubo Cardiomyopthy   CATARACT EXTRACTION, BILATERAL Bilateral 08/10/17 right eye, 08/31/17 left eye   FRACTURE SURGERY  2009   JOINT REPLACEMENT  10/06/21   MASS EXCISION Left 02/28/2018   Path: benign.  Procedure: EXCISIONLEFT MEDIAL THIGH MASS ERAS PATHWAY;  Surgeon: Vanderbilt Ned, MD;  Location: [facility];  Service: General;  Laterality: Left;   PORTACATH PLACEMENT N/A 03/15/2016   Procedure: INSERTION PORT-A-CATH WITH US ;  Surgeon: Alm Angle, MD;  Location: [facility];  Service: General;  Laterality: N/A;   PORTACATH REMOVAL  07/2017   SLEEP STUDY  09/2021   NO SLEEP APNEA   surgical repair left ankle Left 2009   s/p Fall    TONSILLECTOMY AND ADENOIDECTOMY  1948   Age 89   TOTAL HIP ARTHROPLASTY Left 10/06/2021   Procedure: TOTAL HIP ARTHROPLASTY ANTERIOR APPROACH;  Surgeon: Barbarann Oneil BROCKS, MD;  Location: [facility];  Service: Orthopedics;  Laterality: Left;   US  CAROTID DOPPLER BILATERAL ([facility] HX)  01/2021   <50% bilat int carotid sten, otherwise normal.   Zio monitoring     06/2021; sinus, some mild brady, brief SVT, no signif PVCs    Outpatient Medications Prior to Visit  Medication Sig Dispense Refill   atorvastatin  (LIPITOR ) 80 MG tablet Take 1 tablet (80 mg total) by mouth daily. 90 tablet 3   cholecalciferol  (VITAMIN D3) 25 MCG (1000 UNIT) tablet Take 1,000 Units by mouth daily.     folic acid  (FOLVITE ) 1 MG tablet Take 1 tablet  (1 mg total) by mouth daily.     glimepiride  (AMARYL ) 1 MG tablet Take 1 tablet (1 mg total) by mouth daily with breakfast. 90 tablet 3   levothyroxine  (SYNTHROID ) 112 MCG tablet Take 1 tablet (112 mcg total) by mouth daily. 30 tablet 5   Multiple Vitamin (MULTIVITAMIN WITH MINERALS) TABS tablet Take 1 tablet by mouth daily.     Propylene Glycol (SYSTANE BALANCE OP) Place 1 drop into both eyes daily as needed (for dry eyes).     thiamine  100 MG tablet Take 1 tablet (100 mg total) by mouth daily.     carvedilol  (COREG ) 3.125 MG tablet Take 1 tablet (3.125 mg total) by mouth 2 (two) times daily with a meal. 180 tablet 0   DULoxetine  (CYMBALTA ) 60 MG capsule Take 1 capsule (60 mg total) by mouth daily. MUST KEEP APPT FOR FURTHER REFILLS 30 capsule 0  felodipine  (PLENDIL ) 10 MG 24 hr tablet Take 1 tablet (10 mg total) by mouth daily. 90 tablet 0   traZODone  (DESYREL ) 50 MG tablet Take 1 tablet (50 mg total) by mouth at bedtime. 30 tablet 2   valsartan  (DIOVAN ) 160 MG tablet TAKE ONE TABLET BY MOUTH DAILY 30 tablet 0   No facility-administered medications prior to visit.    Allergies  Allergen Reactions   Other Other (See Comments)    STEROIDS- emotional   Prednisone Other (See Comments)    Other reaction(s): Mental Status Changes (intolerance)   Amlodipine  Other (See Comments)    Elevated creatinine   Hydrochlorothiazide  Other (See Comments)    Elevated serum creatinine   Penicillins Other (See Comments)    Unsure of reaction, was 83 years old    Review of Systems As per HPI  PE:    02/02/2024    2:36 PM 02/02/2024    2:33 PM 01/02/2024    2:30 PM  Vitals with BMI  Height  5' 8 5' 8  Weight  165 lbs 3 oz 165 lbs  BMI  25.12 25.09  Systolic 149  148  Diastolic 79  78  Pulse 54  64     Physical Exam  Gen: Alert, well appearing.  Patient is oriented to person, place, time, and situation. AFFECT: pleasant, lucid thought and speech. EARS: Both external auditory canals are  100% occluded by cerumen. Cardiovascular: Regular rhythm and rate without murmur rub or gallop. Lungs are clear bilaterally, breathing nonlabored. Extremities: No edema.  LABS:  Last CBC Lab Results  Component Value Date   WBC 6.8 04/20/2023   HGB 13.5 04/20/2023   HCT 41.0 04/20/2023   MCV 105.2 (H) 04/20/2023   MCH 32.9 05/02/2022   RDW 15.2 04/20/2023   PLT 219.0 04/20/2023   Last metabolic panel Lab Results  Component Value Date   GLUCOSE 102 (H) 04/20/2023   NA 141 04/20/2023   K 4.5 04/20/2023   CL 105 04/20/2023   CO2 26 04/20/2023   BUN 22 04/20/2023   CREATININE 0.98 04/20/2023   GFR 54.71 (L) 04/20/2023   CALCIUM  9.7 04/20/2023   PROT 7.4 04/20/2023   ALBUMIN 4.8 04/20/2023   BILITOT 1.0 04/20/2023   ALKPHOS 67 04/20/2023   AST 24 04/20/2023   ALT 26 04/20/2023   ANIONGAP 8 05/02/2022   Last lipids Lab Results  Component Value Date   CHOL 167 11/18/2022   HDL 78 11/18/2022   LDLCALC 60 11/18/2022   LDLDIRECT 218.0 02/23/2021   TRIG 179 (H) 11/18/2022   CHOLHDL 2.1 11/18/2022   Last hemoglobin A1c Lab Results  Component Value Date   HGBA1C 6.5 (A) 08/17/2023   Last thyroid  functions Lab Results  Component Value Date   TSH 4.17 08/17/2023   T4TOTAL 6.6 05/21/2018   IMPRESSION AND PLAN:  #1 labile hypertension. Well-controlled lately. Continue carvedilol  3.125 mg twice daily,  felodipine  10 mg daily, valsartan  160 mg daily. Basic metabolic panel today.   #2 recurrent major depressive disorder, in remission. Continue Cymbalta  60 mg/day.  3.  Chronic renal insufficiency stage III. Avoid NSAIDs. Focus on good hydration. Monitor basic metabolic panel today.  4.  Bilateral cerumen impaction. Consent obtained. Procedure: Cerumen Disimpaction  Warm water  was applied and gentle ear lavage performed on both sides. There were no complications but the cerumen did not come out. Unable to view anything beyond the cerumen. Pt tolerated procedure  well.   We'll have to refer her to  ENT.  An After Visit Summary was printed and given to the patient.  FOLLOW UP: Return in about 6 months (around 08/03/2024) for routine chronic illness f/u.  Signed:  Gerlene Hockey, MD           02/02/2024

## 2024-02-03 LAB — BASIC METABOLIC PANEL WITH GFR
BUN: 21 mg/dL (ref 7–25)
CO2: 21 mmol/L (ref 20–32)
Calcium: 9.9 mg/dL (ref 8.6–10.4)
Chloride: 101 mmol/L (ref 98–110)
Creat: 0.87 mg/dL (ref 0.60–0.95)
Glucose, Bld: 96 mg/dL (ref 65–99)
Potassium: 5.2 mmol/L (ref 3.5–5.3)
Sodium: 136 mmol/L (ref 135–146)
eGFR: 67 mL/min/1.73m2 (ref >=60)

## 2024-02-04 ENCOUNTER — Ambulatory Visit: Payer: Self-pay | Admitting: Family Medicine

## 2024-02-09 ENCOUNTER — Other Ambulatory Visit: Payer: Self-pay

## 2024-02-19 ENCOUNTER — Ambulatory Visit: Admitting: Internal Medicine

## 2024-02-19 NOTE — Progress Notes (Deleted)
 Name: Marissa Hall  MRN/ DOB: [id number], 1940/12/15   Age/ Sex: 83 y.o., female    PCP: Candise Aleene DEL, MD   Reason for Endocrinology Evaluation: Type 2 Diabetes Mellitus     Date of Initial Endocrinology Visit: 07/20/2021    PATIENT IDENTIFIER: Marissa Hall is a 83 y.o. female with a past medical history of T2DM, Dyslipidemia, Hx of breast Ca, and Hypothyroidism. The patient presented for initial endocrinology clinic visit on 02/19/2024 for consultative assistance with her diabetes management.    HPI: Marissa Hall is transferring care from [facility]    Diagnosed with DM ~ 2005 Prior Medications tried/Intolerance: just metformin             Hemoglobin A1c has ranged from 7.0% in 2019, peaking at 7.4% in 2021.  On her initial visit to our clinic she had an A1c of 7.1%  Attempted to prescribe Jardiance  11/2022 but this was cost prohibitive and glimepiride  was initiated instead  Metformin  discontinued due to diarrhea 2024  She was on levothyroxine  in the past but her TFT's have were not controlled   SUBJECTIVE:   During the last visit (08/17/2023): A1c 6.5%   Today (02/19/24): Marissa Hall is here for follow-up on diabetes management and hypothyroidism. She has not been checking glucose in 2 months.   She continues to follow-up with oncology for Hx of breast CA diagnosed in 2017  Denies nausea, or vomiting  Continues with occasional diarrhea but its better than in the past  Denies local neck swelling  Denies palpitations   HOME DIABETES REGIMEN: Metformin  500 mg XR , BID - not taking  Glimepiride  1 mg daily Synthroid  112 mcg daily     Statin: yes ACE-I/ARB: yes    METER DOWNLOAD SUMMARY:  Range 150-180 mg/dl     DIABETIC COMPLICATIONS: Microvascular complications:  Neuropathy Denies: CKD, retinopathy Last eye exam: Completed 01/05/2024  Macrovascular complications:  Nonischemic cardiomyopathy, TIA Denies: CAD, PVD, CVA   PAST  HISTORY: Past Medical History:  Past Medical History:  Diagnosis Date   Acute DVT (deep venous thrombosis) (HCC) 04/23/2019   Right popliteal   Alcoholism (HCC)    Allergy    Since a child   Anxiety and depression 1964   oncologist started duloxetine  12/2016   Breast cancer (HCC) 03/14/2016   Clinical stage 2A: (triple neg): Right breast, upper inner quadrant, 03/2016.  Neoadjuvant chemo x 5 cycles,lumpectomy 4 mo later, then RT started 10/2016.  Adjuvant Xeloda  8-10,2018.  SWOG research trial pt 04/2017--pt randomized to pembrolizumab  immunotherapy.  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  A1c Mar 12, 2018 was 7.0% at Dr. Karoline.    Epidermoid cyst of vulva    Chronic epidermoid cyst of the vulva.  Excision done 02/2019   GERD (gastroesophageal reflux disease) 2013   History of basal cell cancer    L ankle   Hyperlipidemia 1986   Hypertension 2008   2022 ->addition of hctz to lisinopril  led to 25ml drop in GFR. HCTZ d/cd.   Hypothyroidism 1988  Diagnosed in her 76s.  Managed by Endocrinologist   Lumbar radiculopathy 04/2019   Dr. Claudene to get plain films of LB and hip (considering MRI due to her hx of cancer)   Nonischemic cardiomyopathy (HCC)    Hx of takotsubo CM   Osteoporosis 2015   pt states osteopenia, but then says that she refused to take the rx med for this condition, so I suspect she had osteoporosis.   Peripheral neuropathy 2017   Patient states diabetic neuropathy in feet prior to starting chemotherapy and then worsened by  chemo.    SCC (squamous cell carcinoma), arm, right    Stroke (HCC) 03/2011   Syncope and collapse    05/2021 while in [place] .  No prodrome.  Monitor ordered and cardiology referral ordered 05/14/21   TIA (transient ischemic attack) 03/26/2011   2012: question of (HA + R eye floaters). CT in ED neg acute. Not admitted, no f/u testing done.  ?ocular migraine?   Past Surgical History:  Past Surgical History:  Procedure Laterality Date   ABDOMINAL HYSTERECTOMY  1972   APPENDECTOMY  1972   BREAST ENHANCEMENT SURGERY  1982   BREAST IMPLANT REMOVAL Right 09/13/2016   Procedure: REMOVAL RIGHT BREAST IMPLANT;  Surgeon: Earlis Ranks, MD;  Location: [facility];  Service: Plastics;  Laterality: Right;   BREAST LUMPECTOMY WITH RADIOACTIVE SEED AND SENTINEL LYMPH NODE BIOPSY Right 09/13/2016   Procedure: RIGHT BREAST LUMPECTOMY WITH RADIOACTIVE SEED X 2 AND SENTINEL LYMPH NODE BIOPSY;  Surgeon: Alm Angle, MD;  Location: [facility];  Service: General;  Laterality: Right;   BREAST SURGERY Right 03/14/2016   Biopsy   CAPSULECTOMY Right 09/13/2016   Procedure: RIGHT CAPSULECTOMY;  Surgeon: Earlis Ranks, MD;  Location: [facility];  Service: Plastics;  Laterality: Right;   CARDIAC CATHETERIZATION  2011   Takotsubo Cardiomyopthy   CATARACT EXTRACTION, BILATERAL Bilateral 08/10/17 right eye, 08/31/17 left eye   FRACTURE SURGERY  2009   JOINT REPLACEMENT  10/06/21   MASS EXCISION Left 02/28/2018   Path: benign.  Procedure: EXCISIONLEFT MEDIAL THIGH MASS ERAS PATHWAY;  Surgeon: Vanderbilt Ned, MD;  Location: [facility];  Service: General;  Laterality: Left;   PORTACATH PLACEMENT N/A 03/15/2016   Procedure: INSERTION PORT-A-CATH WITH US ;  Surgeon: Alm Angle, MD;  Location: [facility];  Service: General;  Laterality: N/A;   PORTACATH REMOVAL  07/2017   SLEEP STUDY  09/2021   NO SLEEP APNEA   surgical repair left ankle Left 2009   s/p Fall     TONSILLECTOMY AND ADENOIDECTOMY  1948   Age 43   TOTAL HIP ARTHROPLASTY Left 10/06/2021   Procedure: TOTAL HIP ARTHROPLASTY ANTERIOR APPROACH;  Surgeon: Barbarann Oneil BROCKS, MD;  Location: [facility];  Service: Orthopedics;  Laterality: Left;   US  CAROTID DOPPLER BILATERAL ([facility] HX)  01/2021   <50% bilat int carotid sten, otherwise normal.   Zio monitoring     06/2021; sinus, some mild brady, brief SVT, no signif PVCs    Social History:  reports that she quit smoking about 24 years ago. Her smoking use included cigarettes. She has never used smokeless tobacco. She reports current alcohol use of about 7.0 standard drinks of alcohol per week. She reports that she does not use drugs. Family History:  Family History  Problem Relation Age of Onset   Stroke Mother    Alcohol abuse Mother    Anxiety disorder Mother    Arthritis Mother  Depression Mother    Hypertension Mother    Suicidality Father    Alcohol abuse Father    Depression Father    Stroke Brother    Stroke Son    Sleep apnea Son    Diabetes Son      HOME MEDICATIONS: Allergies as of 02/19/2024       Reactions   Other Other (See Comments)   STEROIDS- emotional   Prednisone Other (See Comments)   Other reaction(s): Mental Status Changes (intolerance)   Amlodipine  Other (See Comments)   Elevated creatinine   Hydrochlorothiazide  Other (See Comments)   Elevated serum creatinine   Penicillins Other (See Comments)   Unsure of reaction, was 83 years old        Medication List        Accurate as of February 19, 2024  7:24 AM. If you have any questions, ask your nurse or doctor.          atorvastatin  80 MG tablet Commonly known as: LIPITOR  Take 1 tablet (80 mg total) by mouth daily.   carvedilol  3.125 MG tablet Commonly known as: COREG  Take 1 tablet (3.125 mg total) by mouth 2 (two) times daily with a meal.   cholecalciferol  25 MCG (1000 UNIT) tablet Commonly known as: VITAMIN D3 Take 1,000 Units by mouth  daily.   DULoxetine  60 MG capsule Commonly known as: CYMBALTA  Take 1 capsule (60 mg total) by mouth daily.   felodipine  10 MG 24 hr tablet Commonly known as: PLENDIL  Take 1 tablet (10 mg total) by mouth daily.   folic acid  1 MG tablet Commonly known as: FOLVITE  Take 1 tablet (1 mg total) by mouth daily.   glimepiride  1 MG tablet Commonly known as: AMARYL  Take 1 tablet (1 mg total) by mouth daily with breakfast.   levothyroxine  112 MCG tablet Commonly known as: SYNTHROID  Take 1 tablet (112 mcg total) by mouth daily.   multivitamin with minerals Tabs tablet Take 1 tablet by mouth daily.   SYSTANE BALANCE OP Place 1 drop into both eyes daily as needed (for dry eyes).   thiamine  100 MG tablet Commonly known as: VITAMIN B1 Take 1 tablet (100 mg total) by mouth daily.   traZODone  50 MG tablet Commonly known as: DESYREL  Take 1 tablet (50 mg total) by mouth at bedtime.   valsartan  160 MG tablet Commonly known as: DIOVAN  Take 1 tablet (160 mg total) by mouth daily.         ALLERGIES: Allergies  Allergen Reactions   Other Other (See Comments)    STEROIDS- emotional   Prednisone Other (See Comments)    Other reaction(s): Mental Status Changes (intolerance)   Amlodipine  Other (See Comments)    Elevated creatinine   Hydrochlorothiazide  Other (See Comments)    Elevated serum creatinine   Penicillins Other (See Comments)    Unsure of reaction, was 83 years old     REVIEW OF SYSTEMS: A comprehensive ROS was conducted with the patient and is negative except as per HPI    OBJECTIVE:   VITAL SIGNS: There were no vitals taken for this visit.   PHYSICAL EXAM:  General: Pt appears well and is in NAD  Neck: General: Supple without adenopathy or carotid bruits. Thyroid : Thyroid  size normal.  No goiter or nodules appreciated.  Lungs: Clear with good BS bilat with no rales, rhonchi, or wheezes  Heart: RRR   Extremities:  Lower extremities - No pretibial edema.    Neuro: MS is good with appropriate  affect, pt is alert and Ox3    DM foot exam: 08/17/2023  The skin of the feet is intact without sores or ulcerations. The pedal pulses are 2+ on right and 2+ on left. The sensation is decreased  to a screening 5.07, 10 gram monofilament on the right    DATA REVIEWED:  Lab Results  Component Value Date   HGBA1C 6.5 (A) 08/17/2023   HGBA1C 7.2 (A) 11/18/2022   HGBA1C 7.0 (A) 05/18/2022     Latest Reference Range & Units 02/02/24 15:02  Sodium 135 - 146 mmol/L 136  Potassium 3.5 - 5.3 mmol/L 5.2  Chloride 98 - 110 mmol/L 101  CO2 20 - 32 mmol/L 21  Glucose 65 - 99 mg/dL 96  BUN 7 - 25 mg/dL 21  Creatinine 9.39 - 9.04 mg/dL 9.12  Calcium  8.6 - 10.4 mg/dL 9.9  BUN/Creatinine Ratio 6 - 22 (calc) SEE NOTE:  eGFR > OR = 60 mL/min/1.63m2 67      ASSESSMENT / PLAN / RECOMMENDATIONS:   1) Type 2 Diabetes Mellitus, Optimally controlled, With neuropathic and Macrovascular complications - Most recent A1c of 6.5 %. Goal A1c < 7.5 %.    -A1c optimal  -Intolerant to metformin  due to diarrhea -SGLT2 inhibitors are cost prohibitive -No change   MEDICATIONS: Continue glimepiride  1 mg daily  EDUCATION / INSTRUCTIONS: BG monitoring instructions: Patient is instructed to check her blood sugars 1 times a day, fasting . Call [facility] if: BG persistently < 70  I reviewed the Rule of 15 for the treatment of hypoglycemia in detail with the patient. Literature supplied.   2) Diabetic complications:  Eye: Does not have known diabetic retinopathy.  Neuro/ Feet: Does  have known diabetic peripheral neuropathy. Renal: Patient does not have known baseline CKD. She is  on an ACEI/ARB at present.     3) Hypothyroidism :  - TSH continues to be within normal range   Medication   Continue Synthroid  112 mcg daily  4) Dyslipidemia:  -LDL at goal -She does admit to imperfect adherence to statin intake   Medication  Continue  atorvastatin  80 mg daily  F/U in 6 months   Signed electronically by: Stefano Redgie Butts, MD  [facility]  [facility] [address] [place] [address] [place], [place] [address] Phone: [phone] FAX: [phone]   CC: Candise Aleene DEL, MD [address] [place] [address] Phone: [phone]  Fax: [phone]    Return to Endocrinology clinic as below: Future Appointments  Date Time Provider Department Center  02/19/2024 10:50 AM Priti Consoli, Donell Redgie, MD [facility] None  05/21/2024 11:15 AM Odean Potts, MD [facility] None  09/25/2024 10:10 AM [facility] ANNUAL WELLNESS VISIT [facility] [address]

## 2024-02-22 ENCOUNTER — Encounter: Payer: Self-pay | Admitting: Family Medicine

## 2024-02-22 NOTE — Telephone Encounter (Signed)
 No further action needed.

## 2024-02-27 DIAGNOSIS — H02834 Dermatochalasis of left upper eyelid: Secondary | ICD-10-CM | POA: Diagnosis not present

## 2024-02-27 DIAGNOSIS — D3132 Benign neoplasm of left choroid: Secondary | ICD-10-CM | POA: Diagnosis not present

## 2024-02-27 DIAGNOSIS — H02831 Dermatochalasis of right upper eyelid: Secondary | ICD-10-CM | POA: Diagnosis not present

## 2024-02-27 DIAGNOSIS — H04123 Dry eye syndrome of bilateral lacrimal glands: Secondary | ICD-10-CM | POA: Diagnosis not present

## 2024-02-27 DIAGNOSIS — H0288A Meibomian gland dysfunction right eye, upper and lower eyelids: Secondary | ICD-10-CM | POA: Diagnosis not present

## 2024-02-27 DIAGNOSIS — H0288B Meibomian gland dysfunction left eye, upper and lower eyelids: Secondary | ICD-10-CM | POA: Diagnosis not present

## 2024-02-27 DIAGNOSIS — E119 Type 2 diabetes mellitus without complications: Secondary | ICD-10-CM | POA: Diagnosis not present

## 2024-02-27 DIAGNOSIS — H35361 Drusen (degenerative) of macula, right eye: Secondary | ICD-10-CM | POA: Diagnosis not present

## 2024-02-27 DIAGNOSIS — Z7984 Long term (current) use of oral hypoglycemic drugs: Secondary | ICD-10-CM | POA: Diagnosis not present

## 2024-02-27 DIAGNOSIS — H524 Presbyopia: Secondary | ICD-10-CM | POA: Diagnosis not present

## 2024-02-27 DIAGNOSIS — H40003 Preglaucoma, unspecified, bilateral: Secondary | ICD-10-CM | POA: Diagnosis not present

## 2024-02-28 ENCOUNTER — Encounter: Payer: Self-pay | Admitting: Family Medicine

## 2024-02-28 ENCOUNTER — Telehealth (INDEPENDENT_AMBULATORY_CARE_PROVIDER_SITE_OTHER): Admitting: Family Medicine

## 2024-02-28 DIAGNOSIS — F515 Nightmare disorder: Secondary | ICD-10-CM

## 2024-02-28 DIAGNOSIS — F331 Major depressive disorder, recurrent, moderate: Secondary | ICD-10-CM

## 2024-02-28 DIAGNOSIS — F410 Panic disorder [episodic paroxysmal anxiety] without agoraphobia: Secondary | ICD-10-CM

## 2024-02-28 DIAGNOSIS — F411 Generalized anxiety disorder: Secondary | ICD-10-CM

## 2024-02-28 MED ORDER — VENLAFAXINE HCL ER 37.5 MG PO CP24: 37.5000 mg | ORAL_CAPSULE | Freq: Every day | ORAL | 0 refills | Status: DC

## 2024-02-28 MED ORDER — ALPRAZOLAM 0.5 MG PO TABS: ORAL_TABLET | ORAL | 1 refills | Status: AC

## 2024-02-28 MED ORDER — DULOXETINE HCL 20 MG PO CPEP
ORAL_CAPSULE | ORAL | 0 refills | Status: AC
Start: 2024-02-28 — End: ?

## 2024-02-28 NOTE — Progress Notes (Signed)
 Virtual Visit via Video Note  I connected with Marissa Hall  on 02/28/24 at  2:40 PM EDT by a video enabled telemedicine application and verified that I am speaking with the correct person using two identifiers.  Location patient: [place] Location provider:work or home office Persons participating in the virtual visit: patient, provider  I discussed the limitations and requested verbal permission for telemedicine visit. The patient expressed understanding and agreed to proceed.   HPI: 83 year old female being seen today for anxiety. Gradually increasing generalized anxiety and near panic over the last couple of months. Has been having nightmares lately, some of them waking her up and causing panic and severe anxiety for the entire next day. Does endorse depression, anhedonia, poor motivation.  No SI or HI. She had a Xanax  from a prescription a few years ago and had to take it recently and it helped.  ROS: No headaches, no tremors, no rash, no focal weakness, no paresthesias, no palpitations, no chest pain, no shortness of breath, no lower extremity edema.  Past Medical History:  Diagnosis Date   Acute DVT (deep venous thrombosis) (HCC) 04/23/2019   Right popliteal   Alcoholism (HCC)    Allergy    Since a child   Anxiety and depression 1964   oncologist started duloxetine  12/2016   Breast cancer (HCC) 03/14/2016   Clinical stage 2A: (triple neg): Right breast, upper inner quadrant, 03/2016.  Neoadjuvant chemo x 5 cycles,lumpectomy 4 mo later, then RT started 10/2016.  Adjuvant Xeloda  8-10,2018.  SWOG research trial pt 04/2017--pt randomized to pembrolizumab  immunotherapy.  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  A1c Mar 12, 2018 was 7.0% at Dr. Karoline.    Epidermoid cyst of vulva    Chronic epidermoid cyst of the vulva.  Excision done 02/2019   GERD (gastroesophageal reflux disease) 2013   History of basal cell cancer    L ankle   Hyperlipidemia 1986   Hypertension 2008   2022 ->addition of hctz to lisinopril  led to 25ml drop in GFR. HCTZ d/cd.   Hypothyroidism 1988   Diagnosed in her 57s.  Managed by Endocrinologist   Lumbar radiculopathy 04/2019   Dr. Claudene to get plain films of LB and hip (considering MRI due to her hx of cancer)   Nonischemic cardiomyopathy (HCC)    Hx of takotsubo CM   Osteoporosis 2015   pt states osteopenia, but then says that she refused to take the rx med for this condition, so I suspect she had osteoporosis.   Peripheral neuropathy 2017   Patient states diabetic neuropathy in feet prior to starting chemotherapy and then worsened by chemo.    SCC (squamous cell carcinoma), arm, right    Stroke (HCC) 03/2011   Syncope and collapse    05/2021 while in [place] .  No prodrome.  Monitor ordered and cardiology referral ordered 05/14/21   TIA (transient ischemic attack) 03/26/2011   2012: question of (HA + R eye floaters). CT in ED neg acute. Not admitted, no f/u testing done.  ?ocular migraine?  Past Surgical History:  Procedure Laterality Date   ABDOMINAL HYSTERECTOMY  1972   APPENDECTOMY  1972   BREAST ENHANCEMENT SURGERY  1982   BREAST IMPLANT REMOVAL Right 09/13/2016   Procedure: REMOVAL RIGHT BREAST IMPLANT;  Surgeon: Earlis Ranks, MD;  Location: [facility];  Service: Plastics;  Laterality: Right;   BREAST LUMPECTOMY WITH RADIOACTIVE SEED AND SENTINEL LYMPH NODE BIOPSY Right 09/13/2016   Procedure: RIGHT BREAST LUMPECTOMY WITH RADIOACTIVE SEED X 2 AND SENTINEL LYMPH NODE BIOPSY;  Surgeon:  Alm Angle, MD;  Location: [facility];  Service: General;  Laterality: Right;   BREAST SURGERY Right 03/14/2016   Biopsy   CAPSULECTOMY Right 09/13/2016   Procedure: RIGHT CAPSULECTOMY;  Surgeon: Earlis Ranks, MD;  Location: [facility];  Service: Plastics;  Laterality: Right;   CARDIAC CATHETERIZATION  2011   Takotsubo Cardiomyopthy   CATARACT EXTRACTION, BILATERAL Bilateral 08/10/17 right eye, 08/31/17 left eye   FRACTURE SURGERY  2009   JOINT REPLACEMENT  10/06/21   MASS EXCISION Left 02/28/2018   Path: benign.  Procedure: EXCISIONLEFT MEDIAL THIGH MASS ERAS PATHWAY;  Surgeon: Vanderbilt Ned, MD;  Location: [facility];  Service: General;  Laterality: Left;   PORTACATH PLACEMENT N/A 03/15/2016   Procedure: INSERTION PORT-A-CATH WITH US ;  Surgeon: Alm Angle, MD;  Location: [facility];  Service: General;  Laterality: N/A;   PORTACATH REMOVAL  07/2017   SLEEP STUDY  09/2021   NO SLEEP APNEA   surgical repair left ankle Left 2009   s/p Fall    TONSILLECTOMY AND ADENOIDECTOMY  1948   Age 75   TOTAL HIP ARTHROPLASTY Left 10/06/2021   Procedure: TOTAL HIP ARTHROPLASTY ANTERIOR APPROACH;  Surgeon: Barbarann Oneil BROCKS, MD;  Location: [facility];  Service: Orthopedics;  Laterality: Left;   US  CAROTID DOPPLER BILATERAL ([facility] HX)  01/2021   <50% bilat int carotid sten, otherwise normal.   Zio monitoring     06/2021; sinus, some mild brady, brief SVT, no signif PVCs     Current Outpatient Medications:    ALPRAZolam  (XANAX ) 0.5 MG tablet, 1/2-1 tab every 12 hours as needed for severe anxiety, Disp: 30 tablet, Rfl: 1   atorvastatin  (LIPITOR ) 80 MG tablet, Take 1 tablet (80 mg total) by mouth daily., Disp: 90 tablet, Rfl: 3   carvedilol  (COREG ) 3.125 MG tablet, Take 1 tablet (3.125 mg total) by mouth 2 (two) times daily with a meal., Disp: 180 tablet, Rfl: 1   cholecalciferol  (VITAMIN D3) 25 MCG (1000 UNIT) tablet, Take 1,000 Units by mouth daily., Disp: , Rfl:     DULoxetine  (CYMBALTA ) 20 MG capsule, 1 cap daily for 10 days then 1 cap every other day for 10 days then stop, Disp: 15 capsule, Rfl: 0   felodipine  (PLENDIL ) 10 MG 24 hr tablet, Take 1 tablet (10 mg total) by mouth daily., Disp: 90 tablet, Rfl: 3   folic acid  (FOLVITE ) 1 MG tablet, Take 1 tablet (1 mg total) by mouth daily., Disp: , Rfl:    glimepiride  (AMARYL ) 1 MG tablet, Take 1 tablet (1 mg total) by mouth daily with breakfast., Disp: 90 tablet, Rfl: 3   levothyroxine  (SYNTHROID ) 112 MCG tablet, Take 1 tablet (112 mcg total) by mouth daily., Disp: 30 tablet, Rfl: 5   Multiple Vitamin (MULTIVITAMIN WITH MINERALS) TABS tablet, Take 1 tablet by mouth daily., Disp: , Rfl:    Propylene Glycol (SYSTANE BALANCE OP), Place 1 drop into both eyes daily as needed (for dry eyes).,  Disp: , Rfl:    thiamine  100 MG tablet, Take 1 tablet (100 mg total) by mouth daily., Disp: , Rfl:    traZODone  (DESYREL ) 50 MG tablet, Take 1 tablet (50 mg total) by mouth at bedtime., Disp: 30 tablet, Rfl: 6   valsartan  (DIOVAN ) 160 MG tablet, Take 1 tablet (160 mg total) by mouth daily., Disp: 90 tablet, Rfl: 3   venlafaxine  XR (EFFEXOR  XR) 37.5 MG 24 hr capsule, Take 1 capsule (37.5 mg total) by mouth daily with breakfast., Disp: 30 capsule, Rfl: 0  EXAM:  VITALS per patient if applicable:     02/02/2024    2:36 PM 02/02/2024    2:33 PM 01/02/2024    2:30 PM  Vitals with BMI  Height  5' 8 5' 8  Weight  165 lbs 3 oz 165 lbs  BMI  25.12 25.09  Systolic 149  148  Diastolic 79  78  Pulse 54  64    GENERAL: alert, oriented, appears well and in no acute distress  HEENT: atraumatic, conjunttiva clear, no obvious abnormalities on inspection of external nose and ears  NECK: normal movements of the head and neck  LUNGS: on inspection no signs of respiratory distress, breathing rate appears normal, no obvious gross SOB, gasping or wheezing  CV: no obvious cyanosis  MS: moves all visible extremities without  noticeable abnormality  PSYCH/NEURO: pleasant and cooperative, no obvious depression or anxiety, speech and thought processing grossly intact  LABS: none today    Chemistry      Component Value Date/Time   NA 136 02/02/2024 1502   NA 136 05/01/2017 0914   K 5.2 02/02/2024 1502   K 4.5 05/01/2017 0914   CL 101 02/02/2024 1502   CO2 21 02/02/2024 1502   CO2 24 05/01/2017 0914   BUN 21 02/02/2024 1502   BUN 14.9 05/01/2017 0914   CREATININE 0.87 02/02/2024 1502   CREATININE 0.8 05/01/2017 0914   GLU 181 09/20/2019 0000      Component Value Date/Time   CALCIUM  9.9 02/02/2024 1502   CALCIUM  9.6 05/01/2017 0914   ALKPHOS 67 04/20/2023 1420   ALKPHOS 75 05/01/2017 0914   AST 24 04/20/2023 1420   AST 22 05/02/2022 1128   AST 51 (H) 05/01/2017 0914   ALT 26 04/20/2023 1420   ALT 29 05/02/2022 1128   ALT 62 (H) 05/01/2017 0914   BILITOT 1.0 04/20/2023 1420   BILITOT 1.8 (H) 05/02/2022 1128   BILITOT 1.30 (H) 05/01/2017 0914     ASSESSMENT AND PLAN:  Discussed the following assessment and plan:  GAD, panic attacks, recurrent major depressive disorder-> active. Wean off duloxetine  and start Effexor  (she recalls being on Effexor  and helping in the remote past).  20 mg duloxetine  every day for 10 days and then every other day for 10 days, then stop. Start Effexor  XR 37.5 mg once a day today. Follow-up in 2 weeks and likely increase to 75 mg at that time. Alprazolam  0.5 mg, 1/2-1 tab every 12 hours as needed, #30, refill x 1.   I discussed the assessment and treatment plan with the patient. The patient was provided an opportunity to ask questions and all were answered. The patient agreed with the plan and demonstrated an understanding of the instructions.   F/u: 2 wks virtual  Signed:  Gerlene Hockey, MD           02/28/2024

## 2024-03-01 DIAGNOSIS — H6123 Impacted cerumen, bilateral: Secondary | ICD-10-CM | POA: Diagnosis not present

## 2024-03-01 DIAGNOSIS — L299 Pruritus, unspecified: Secondary | ICD-10-CM | POA: Diagnosis not present

## 2024-03-04 DIAGNOSIS — D485 Neoplasm of uncertain behavior of skin: Secondary | ICD-10-CM | POA: Diagnosis not present

## 2024-03-04 DIAGNOSIS — L820 Inflamed seborrheic keratosis: Secondary | ICD-10-CM | POA: Diagnosis not present

## 2024-03-04 DIAGNOSIS — L538 Other specified erythematous conditions: Secondary | ICD-10-CM | POA: Diagnosis not present

## 2024-03-04 DIAGNOSIS — D692 Other nonthrombocytopenic purpura: Secondary | ICD-10-CM | POA: Diagnosis not present

## 2024-03-04 DIAGNOSIS — L578 Other skin changes due to chronic exposure to nonionizing radiation: Secondary | ICD-10-CM | POA: Diagnosis not present

## 2024-03-04 DIAGNOSIS — L570 Actinic keratosis: Secondary | ICD-10-CM | POA: Diagnosis not present

## 2024-03-04 DIAGNOSIS — C44629 Squamous cell carcinoma of skin of left upper limb, including shoulder: Secondary | ICD-10-CM | POA: Diagnosis not present

## 2024-03-08 ENCOUNTER — Ambulatory Visit: Admitting: Internal Medicine

## 2024-03-08 NOTE — Progress Notes (Deleted)
 Name: Marissa Hall  MRN/ DOB: [id number], Jan 31, 1941   Age/ Sex: 83 y.o., female    PCP: Candise Aleene DEL, MD   Reason for Endocrinology Evaluation: Type 2 Diabetes Mellitus     Date of Initial Endocrinology Visit: 07/20/2021    PATIENT IDENTIFIER: Marissa Hall is a 83 y.o. female with a past medical history of T2DM, Dyslipidemia, Hx of breast Ca, and Hypothyroidism. The patient presented for initial endocrinology clinic visit on 03/08/2024 for consultative assistance with her diabetes management.    HPI: Marissa Hall is transferring care from [facility]    Diagnosed with DM ~ 2005 Prior Medications tried/Intolerance: just metformin             Hemoglobin A1c has ranged from 7.0% in 2019, peaking at 7.4% in 2021.  On her initial visit to our clinic she had an A1c of 7.1%  Attempted to prescribe Jardiance  11/2022 but this was cost prohibitive and glimepiride  was initiated instead  Metformin  discontinued due to diarrhea 2024  She was on levothyroxine  in the past but her TFT's have were not controlled   SUBJECTIVE:   During the last visit (08/17/2023): A1c 6.5%   Today (03/08/24): Marissa Hall is here for follow-up on diabetes management and hypothyroidism. She has not been checking glucose in 2 months.   She continues to follow-up with oncology for Hx of breast CA diagnosed in 2017  Denies nausea, or vomiting  Continues with occasional diarrhea but its better than in the past  Denies local neck swelling  Denies palpitations   HOME DIABETES REGIMEN: Metformin  500 mg XR , BID - not taking  Glimepiride  1 mg daily Synthroid  112 mcg daily     Statin: yes ACE-I/ARB: yes    METER DOWNLOAD SUMMARY:  Range 150-180 mg/dl     DIABETIC COMPLICATIONS: Microvascular complications:  Neuropathy Denies: CKD, retinopathy Last eye exam: Completed 01/05/2024  Macrovascular complications:  Nonischemic cardiomyopathy, TIA Denies: CAD, PVD, CVA   PAST  HISTORY: Past Medical History:  Past Medical History:  Diagnosis Date   Acute DVT (deep venous thrombosis) (HCC) 04/23/2019   Right popliteal   Alcoholism (HCC)    Allergy    Since a child   Anxiety and depression 1964   oncologist started duloxetine  12/2016   Breast cancer (HCC) 03/14/2016   Clinical stage 2A: (triple neg): Right breast, upper inner quadrant, 03/2016.  Neoadjuvant chemo x 5 cycles,lumpectomy 4 mo later, then RT started 10/2016.  Adjuvant Xeloda  8-10,2018.  SWOG research trial pt 04/2017--pt randomized to pembrolizumab  immunotherapy.  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  A1c Mar 12, 2018 was 7.0% at Dr. Karoline.    Epidermoid cyst of vulva    Chronic epidermoid cyst of the vulva.  Excision done 02/2019   GERD (gastroesophageal reflux disease) 2013   History of basal cell cancer    L ankle   Hyperlipidemia 1986   Hypertension 2008   2022 ->addition of hctz to lisinopril  led to 25ml drop in GFR. HCTZ d/cd.   Hypothyroidism 1988   Diagnosed  in her 35s.  Managed by Endocrinologist   Lumbar radiculopathy 04/2019   Dr. Claudene to get plain films of LB and hip (considering MRI due to her hx of cancer)   Nonischemic cardiomyopathy (HCC)    Hx of takotsubo CM   Osteoporosis 2015   pt states osteopenia, but then says that she refused to take the rx med for this condition, so I suspect she had osteoporosis.   Peripheral neuropathy 2017   Patient states diabetic neuropathy in feet prior to starting chemotherapy and then worsened by chemo.     SCC (squamous cell carcinoma), arm, right    Stroke (HCC) 03/2011   Syncope and collapse    05/2021 while in [place] .  No prodrome.  Monitor ordered and cardiology referral ordered 05/14/21   TIA (transient ischemic attack) 03/26/2011   2012: question of (HA + R eye floaters). CT in ED neg acute. Not admitted, no f/u testing done.  ?ocular migraine?   Past Surgical History:  Past Surgical History:  Procedure Laterality Date   ABDOMINAL HYSTERECTOMY  1972   APPENDECTOMY  1972   BREAST ENHANCEMENT SURGERY  1982   BREAST IMPLANT REMOVAL Right 09/13/2016   Procedure: REMOVAL RIGHT BREAST IMPLANT;  Surgeon: Earlis Ranks, MD;  Location: [facility];  Service: Plastics;  Laterality: Right;   BREAST LUMPECTOMY WITH RADIOACTIVE SEED AND SENTINEL LYMPH NODE BIOPSY Right 09/13/2016   Procedure: RIGHT BREAST LUMPECTOMY WITH RADIOACTIVE SEED X 2 AND SENTINEL LYMPH NODE BIOPSY;  Surgeon: Alm Angle, MD;  Location: [facility];  Service: General;  Laterality: Right;   BREAST SURGERY Right 03/14/2016   Biopsy   CAPSULECTOMY Right 09/13/2016   Procedure: RIGHT CAPSULECTOMY;  Surgeon: Earlis Ranks, MD;  Location: [facility];  Service: Plastics;  Laterality: Right;   CARDIAC CATHETERIZATION  2011   Takotsubo Cardiomyopthy   CATARACT EXTRACTION, BILATERAL Bilateral 08/10/17 right eye, 08/31/17 left eye   FRACTURE SURGERY  2009   JOINT REPLACEMENT  10/06/21   MASS EXCISION Left 02/28/2018   Path: benign.  Procedure: EXCISIONLEFT MEDIAL THIGH MASS ERAS PATHWAY;  Surgeon: Vanderbilt Ned, MD;  Location: [facility];  Service: General;  Laterality: Left;   PORTACATH PLACEMENT N/A 03/15/2016   Procedure: INSERTION PORT-A-CATH WITH US ;  Surgeon: Alm Angle, MD;  Location: [facility];  Service: General;  Laterality: N/A;   PORTACATH REMOVAL  07/2017   SLEEP STUDY  09/2021   NO SLEEP APNEA   surgical repair left ankle Left 2009   s/p Fall     TONSILLECTOMY AND ADENOIDECTOMY  1948   Age 69   TOTAL HIP ARTHROPLASTY Left 10/06/2021   Procedure: TOTAL HIP ARTHROPLASTY ANTERIOR APPROACH;  Surgeon: Barbarann Oneil BROCKS, MD;  Location: [facility];  Service: Orthopedics;  Laterality: Left;   US  CAROTID DOPPLER BILATERAL ([facility] HX)  01/2021   <50% bilat int carotid sten, otherwise normal.   Zio monitoring     06/2021; sinus, some mild brady, brief SVT, no signif PVCs    Social History:  reports that she quit smoking about 25 years ago. Her smoking use included cigarettes. She has never used smokeless tobacco. She reports current alcohol use of about 7.0 standard drinks of alcohol per week. She reports that she does not use drugs. Family History:  Family History  Problem Relation Age of Onset   Stroke Mother    Alcohol abuse Mother    Anxiety disorder Mother    Arthritis Mother  Depression Mother    Hypertension Mother    Suicidality Father    Alcohol abuse Father    Depression Father    Stroke Brother    Stroke Son    Sleep apnea Son    Diabetes Son      HOME MEDICATIONS: Allergies as of 03/08/2024       Reactions   Other Other (See Comments)   STEROIDS- emotional   Prednisone Other (See Comments)   Other reaction(s): Mental Status Changes (intolerance)   Amlodipine  Other (See Comments)   Elevated creatinine   Hydrochlorothiazide  Other (See Comments)   Elevated serum creatinine   Penicillins Other (See Comments)   Unsure of reaction, was 83 years old        Medication Hall        Accurate as of March 08, 2024  7:33 AM. If you have any questions, ask your nurse or doctor.          ALPRAZolam  0.5 MG tablet Commonly known as: XANAX  1/2-1 tab every 12 hours as needed for severe anxiety   atorvastatin  80 MG tablet Commonly known as: LIPITOR  Take 1 tablet (80 mg total) by mouth daily.   carvedilol  3.125 MG tablet Commonly known as: COREG  Take 1 tablet (3.125 mg total) by mouth 2 (two) times daily with a meal.    cholecalciferol  25 MCG (1000 UNIT) tablet Commonly known as: VITAMIN D3 Take 1,000 Units by mouth daily.   DULoxetine  20 MG capsule Commonly known as: Cymbalta  1 cap daily for 10 days then 1 cap every other day for 10 days then stop   felodipine  10 MG 24 hr tablet Commonly known as: PLENDIL  Take 1 tablet (10 mg total) by mouth daily.   folic acid  1 MG tablet Commonly known as: FOLVITE  Take 1 tablet (1 mg total) by mouth daily.   glimepiride  1 MG tablet Commonly known as: AMARYL  Take 1 tablet (1 mg total) by mouth daily with breakfast.   levothyroxine  112 MCG tablet Commonly known as: SYNTHROID  Take 1 tablet (112 mcg total) by mouth daily.   multivitamin with minerals Tabs tablet Take 1 tablet by mouth daily.   SYSTANE BALANCE OP Place 1 drop into both eyes daily as needed (for dry eyes).   thiamine  100 MG tablet Commonly known as: VITAMIN B1 Take 1 tablet (100 mg total) by mouth daily.   traZODone  50 MG tablet Commonly known as: DESYREL  Take 1 tablet (50 mg total) by mouth at bedtime.   valsartan  160 MG tablet Commonly known as: DIOVAN  Take 1 tablet (160 mg total) by mouth daily.   venlafaxine  XR 37.5 MG 24 hr capsule Commonly known as: Effexor  XR Take 1 capsule (37.5 mg total) by mouth daily with breakfast.         ALLERGIES: Allergies  Allergen Reactions   Other Other (See Comments)    STEROIDS- emotional   Prednisone Other (See Comments)    Other reaction(s): Mental Status Changes (intolerance)   Amlodipine  Other (See Comments)    Elevated creatinine   Hydrochlorothiazide  Other (See Comments)    Elevated serum creatinine   Penicillins Other (See Comments)    Unsure of reaction, was 83 years old     REVIEW OF SYSTEMS: A comprehensive ROS was conducted with the patient and is negative except as per HPI    OBJECTIVE:   VITAL SIGNS: There were no vitals taken for this visit.   PHYSICAL EXAM:  General: Pt appears well and is in NAD  Neck:  General: Supple without adenopathy or carotid bruits. Thyroid : Thyroid  size normal.  No goiter or nodules appreciated.  Lungs: Clear with good BS bilat with no rales, rhonchi, or wheezes  Heart: RRR   Extremities:  Lower extremities - No pretibial edema.   Neuro: MS is good with appropriate affect, pt is alert and Ox3    DM foot exam: 08/17/2023  The skin of the feet is intact without sores or ulcerations. The pedal pulses are 2+ on right and 2+ on left. The sensation is decreased  to a screening 5.07, 10 gram monofilament on the right    DATA REVIEWED:  Lab Results  Component Value Date   HGBA1C 6.5 (A) 08/17/2023   HGBA1C 7.2 (A) 11/18/2022   HGBA1C 7.0 (A) 05/18/2022     Latest Reference Range & Units 02/02/24 15:02  Sodium 135 - 146 mmol/L 136  Potassium 3.5 - 5.3 mmol/L 5.2  Chloride 98 - 110 mmol/L 101  CO2 20 - 32 mmol/L 21  Glucose 65 - 99 mg/dL 96  BUN 7 - 25 mg/dL 21  Creatinine 9.39 - 9.04 mg/dL 9.12  Calcium  8.6 - 10.4 mg/dL 9.9  BUN/Creatinine Ratio 6 - 22 (calc) SEE NOTE:  eGFR > OR = 60 mL/min/1.50m2 67      ASSESSMENT / PLAN / RECOMMENDATIONS:   1) Type 2 Diabetes Mellitus, Optimally controlled, With neuropathic and Macrovascular complications - Most recent A1c of 6.5 %. Goal A1c < 7.5 %.    -A1c optimal  -Intolerant to metformin  due to diarrhea -SGLT2 inhibitors are cost prohibitive -No change   MEDICATIONS: Continue glimepiride  1 mg daily  EDUCATION / INSTRUCTIONS: BG monitoring instructions: Patient is instructed to check her blood sugars 1 times a day, fasting . Call [facility] if: BG persistently < 70  I reviewed the Rule of 15 for the treatment of hypoglycemia in detail with the patient. Literature supplied.   2) Diabetic complications:  Eye: Does not have known diabetic retinopathy.  Neuro/ Feet: Does  have known diabetic peripheral neuropathy. Renal: Patient does not have known baseline CKD. She is  on an ACEI/ARB  at present.     3) Hypothyroidism :  - TSH continues to be within normal range   Medication   Continue Synthroid  112 mcg daily  4) Dyslipidemia:  -LDL at goal -She does admit to imperfect adherence to statin intake   Medication  Continue atorvastatin  80 mg daily  F/U in 6 months   Signed electronically by: Stefano Redgie Butts, MD  [facility]  [facility] [address] [place] [place], [place] [address] Phone: [phone] FAX: [phone]   CC: Candise Aleene DEL, MD [address] [place] [address] Phone: [phone]  Fax: [phone]    Return to Endocrinology clinic as below: Future Appointments  Date Time Provider Department Center  03/08/2024 11:50 AM Brunella Wileman, Donell Redgie, MD [facility] None  05/21/2024 11:15 AM Odean Potts, MD [facility] None  09/25/2024 10:10 AM [facility] ANNUAL WELLNESS VISIT [facility] [address]

## 2024-03-18 DIAGNOSIS — L570 Actinic keratosis: Secondary | ICD-10-CM | POA: Diagnosis not present

## 2024-03-18 DIAGNOSIS — D485 Neoplasm of uncertain behavior of skin: Secondary | ICD-10-CM | POA: Diagnosis not present

## 2024-03-18 DIAGNOSIS — L2989 Other pruritus: Secondary | ICD-10-CM | POA: Diagnosis not present

## 2024-03-22 ENCOUNTER — Encounter: Payer: Self-pay | Admitting: Family Medicine

## 2024-03-22 ENCOUNTER — Ambulatory Visit: Admitting: Family Medicine

## 2024-03-22 VITALS — BP 149/75 | HR 73 | Temp 98.6°F | Ht 68.0 in | Wt 164.8 lb

## 2024-03-22 DIAGNOSIS — F410 Panic disorder [episodic paroxysmal anxiety] without agoraphobia: Secondary | ICD-10-CM

## 2024-03-22 DIAGNOSIS — F3341 Major depressive disorder, recurrent, in partial remission: Secondary | ICD-10-CM

## 2024-03-22 DIAGNOSIS — E119 Type 2 diabetes mellitus without complications: Secondary | ICD-10-CM | POA: Diagnosis not present

## 2024-03-22 DIAGNOSIS — E039 Hypothyroidism, unspecified: Secondary | ICD-10-CM

## 2024-03-22 DIAGNOSIS — Z7984 Long term (current) use of oral hypoglycemic drugs: Secondary | ICD-10-CM | POA: Diagnosis not present

## 2024-03-22 DIAGNOSIS — R0989 Other specified symptoms and signs involving the circulatory and respiratory systems: Secondary | ICD-10-CM

## 2024-03-22 DIAGNOSIS — F411 Generalized anxiety disorder: Secondary | ICD-10-CM | POA: Diagnosis not present

## 2024-03-22 LAB — POCT GLYCOSYLATED HEMOGLOBIN (HGB A1C)
HbA1c POC (<> result, manual entry): 6.4 % (ref 4.0–5.6)
HbA1c, POC (controlled diabetic range): 6.4 % (ref 0.0–7.0)
HbA1c, POC (prediabetic range): 6.4 % (ref 5.7–6.4)
Hemoglobin A1C: 6.4 % — AB (ref 4.0–5.6)

## 2024-03-22 MED ORDER — VENLAFAXINE HCL ER 37.5 MG PO CP24: 37.5000 mg | ORAL_CAPSULE | Freq: Every day | ORAL | 1 refills | Status: AC

## 2024-03-22 MED ORDER — LEVOTHYROXINE SODIUM 112 MCG PO TABS: 112.0000 ug | ORAL_TABLET | Freq: Every day | ORAL | 3 refills | Status: AC

## 2024-03-22 NOTE — Progress Notes (Signed)
 OFFICE VISIT  03/22/2024  CC:  Chief Complaint  Patient presents with   Medical Management of Chronic Issues    Patient is a 83 y.o. female who presents for 3-week follow-up anxiety and depression. A/P as of last visit: GAD, panic attacks, recurrent major depressive disorder-> active. Wean off duloxetine  and start Effexor  (she recalls being on Effexor  and helping in the remote past).  20 mg duloxetine  every day for 10 days and then every other day for 10 days, then stop. Start Effexor  XR 37.5 mg once a day today. Follow-up in 2 weeks and likely increase to 75 mg at that time. Alprazolam  0.5 mg, 1/2-1 tab every 12 hours as needed, #30, refill x 1.  INTERIM HX: Marlinda is doing much better. She has not had to use the alprazolam . She is taking the Effexor  37.5 mg daily and has weaned off the duloxetine .  She still has nightmares and she feels like it is due to her felodipine .  She recently had problems refilling it for a few days and noticed that she had no nightmares on those nights. She has restarted the medication and takes it in the morning now and feels like this is better.  Home blood pressures 140s lately.  70s diastolic.  Heart rate 70s  She asks if I will assume responsibility for managing her diabetes and hypothyroidism now. She states that only the brand name Synthroid  works for her. She does not monitor glucoses lately at all but in the past has monitored them and says it was typically in the 140-150 range fasting and postprandial.  ROS as above, plus--> no fevers, no CP, no SOB, no wheezing, no cough, no dizziness, no HAs, no rashes, no melena/hematochezia.  No polyuria or polydipsia.  No myalgias or arthralgias.  No focal weakness, paresthesias, or tremors.  No acute vision or hearing abnormalities.  No dysuria or unusual/new urinary urgency or frequency.  No recent changes in lower legs. No n/v/d or abd pain.  No palpitations.    Past Medical History:  Diagnosis Date    Acute DVT (deep venous thrombosis) (HCC) 04/23/2019   Right popliteal   Alcoholism (HCC)    Allergy    Since a child   Anxiety and depression 1964   oncologist started duloxetine  12/2016   Breast cancer (HCC) 03/14/2016   Clinical stage 2A: (triple neg): Right breast, upper inner quadrant, 03/2016.  Neoadjuvant chemo x 5 cycles,lumpectomy 4 mo later, then RT started 10/2016.  Adjuvant Xeloda  8-10,2018.  SWOG research trial pt 04/2017--pt randomized to pembrolizumab  immunotherapy.  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  A1c Mar 12, 2018 was 7.0% at Dr. Karoline.    Epidermoid cyst of vulva    Chronic epidermoid cyst of the vulva.  Excision done 02/2019   GERD (gastroesophageal reflux disease) 2013   History of basal cell cancer    L ankle   Hyperlipidemia 1986   Hypertension 2008   2022 ->addition of hctz to lisinopril  led to 25ml drop in GFR. HCTZ d/cd.   Hypothyroidism 1988  Diagnosed in her 30s.  Managed by Endocrinologist   Lumbar radiculopathy 04/2019   Dr. Claudene to get plain films of LB and hip (considering MRI due to her hx of cancer)   Nonischemic cardiomyopathy (HCC)    Hx of takotsubo CM   Osteoporosis 2015   pt states osteopenia, but then says that she refused to take the rx med for this condition, so I suspect she had osteoporosis.   Peripheral neuropathy 2017   Patient states diabetic neuropathy in feet prior to starting chemotherapy and then worsened by chemo.    SCC (squamous cell carcinoma), arm, right    Stroke (HCC) 03/2011    Syncope and collapse    05/2021 while in [place] .  No prodrome.  Monitor ordered and cardiology referral ordered 05/14/21   TIA (transient ischemic attack) 03/26/2011   2012: question of (HA + R eye floaters). CT in ED neg acute. Not admitted, no f/u testing done.  ?ocular migraine?    Past Surgical History:  Procedure Laterality Date   ABDOMINAL HYSTERECTOMY  1972   APPENDECTOMY  1972   BREAST ENHANCEMENT SURGERY  1982   BREAST IMPLANT REMOVAL Right 09/13/2016   Procedure: REMOVAL RIGHT BREAST IMPLANT;  Surgeon: Earlis Ranks, MD;  Location: [facility];  Service: Plastics;  Laterality: Right;   BREAST LUMPECTOMY WITH RADIOACTIVE SEED AND SENTINEL LYMPH NODE BIOPSY Right 09/13/2016   Procedure: RIGHT BREAST LUMPECTOMY WITH RADIOACTIVE SEED X 2 AND SENTINEL LYMPH NODE BIOPSY;  Surgeon: Alm Angle, MD;  Location: [facility];  Service: General;  Laterality: Right;   BREAST SURGERY Right 03/14/2016   Biopsy   CAPSULECTOMY Right 09/13/2016   Procedure: RIGHT CAPSULECTOMY;  Surgeon: Earlis Ranks, MD;  Location: [facility];  Service: Plastics;  Laterality: Right;   CARDIAC CATHETERIZATION  2011   Takotsubo Cardiomyopthy   CATARACT EXTRACTION, BILATERAL Bilateral 08/10/17 right eye, 08/31/17 left eye   FRACTURE SURGERY  2009   JOINT REPLACEMENT  10/06/21   MASS EXCISION Left 02/28/2018   Path: benign.  Procedure: EXCISIONLEFT MEDIAL THIGH MASS ERAS PATHWAY;  Surgeon: Vanderbilt Ned, MD;  Location:  SUR[facility]ERY CENTER;  Service: General;  Laterality: Left;   PORTACATH PLACEMENT N/A 03/15/2016   Procedure: INSERTION PORT-A-CATH WITH US ;  Surgeon: Alm Angle, MD;  Location: [facility];  Service: General;  Laterality: N/A;   PORTACATH REMOVAL  07/2017   SLEEP STUDY  09/2021   NO SLEEP APNEA   surgical repair left ankle Left 2009   s/p Fall    TONSILLECTOMY AND ADENOIDECTOMY  1948   Age 35   TOTAL HIP ARTHROPLASTY Left 10/06/2021    Procedure: TOTAL HIP ARTHROPLASTY ANTERIOR APPROACH;  Surgeon: Barbarann Oneil BROCKS, MD;  Location: [facility];  Service: Orthopedics;  Laterality: Left;   US  CAROTID DOPPLER BILATERAL ([facility] HX)  01/2021   <50% bilat int carotid sten, otherwise normal.   Zio monitoring     06/2021; sinus, some mild brady, brief SVT, no signif PVCs    Outpatient Medications Prior to Visit  Medication Sig Dispense Refill   ALPRAZolam  (XANAX ) 0.5 MG tablet 1/2-1 tab every 12 hours as needed for severe anxiety 30 tablet 1   atorvastatin  (LIPITOR ) 80 MG tablet Take 1 tablet (80 mg total) by mouth daily. 90 tablet 3   carvedilol  (COREG ) 3.125 MG tablet Take 1 tablet (3.125 mg total) by mouth 2 (two) times daily with a meal. 180 tablet 1   cholecalciferol  (VITAMIN D3) 25 MCG (1000  UNIT) tablet Take 1,000 Units by mouth daily.     DULoxetine  (CYMBALTA ) 20 MG capsule 1 cap daily for 10 days then 1 cap every other day for 10 days then stop 15 capsule 0   felodipine  (PLENDIL ) 10 MG 24 hr tablet Take 1 tablet (10 mg total) by mouth daily. 90 tablet 3   folic acid  (FOLVITE ) 1 MG tablet Take 1 tablet (1 mg total) by mouth daily.     glimepiride  (AMARYL ) 1 MG tablet Take 1 tablet (1 mg total) by mouth daily with breakfast. 90 tablet 3   levothyroxine  (SYNTHROID ) 112 MCG tablet Take 1 tablet (112 mcg total) by mouth daily. 30 tablet 5   Multiple Vitamin (MULTIVITAMIN WITH MINERALS) TABS tablet Take 1 tablet by mouth daily.     Propylene Glycol (SYSTANE BALANCE OP) Place 1 drop into both eyes daily as needed (for dry eyes).     thiamine  100 MG tablet Take 1 tablet (100 mg total) by mouth daily.     traZODone  (DESYREL ) 50 MG tablet Take 1 tablet (50 mg total) by mouth at bedtime. 30 tablet 6   triamcinolone  cream (KENALOG) 0.1 % Apply 1 Application topically 3 (three) times daily.     valsartan  (DIOVAN ) 160 MG tablet Take 1 tablet (160 mg total) by mouth daily. 90 tablet 3   venlafaxine  XR (EFFEXOR  XR) 37.5 MG 24 hr capsule Take 1 capsule  (37.5 mg total) by mouth daily with breakfast. 30 capsule 0   No facility-administered medications prior to visit.    Allergies  Allergen Reactions   Other Other (See Comments)    STEROIDS- emotional   Prednisone Other (See Comments)    Other reaction(s): Mental Status Changes (intolerance)   Amlodipine  Other (See Comments)    Elevated creatinine   Hydrochlorothiazide  Other (See Comments)    Elevated serum creatinine   Penicillins Other (See Comments)    Unsure of reaction, was 83 years old    Review of Systems As per HPI  PE:    03/22/2024    4:02 PM 03/22/2024    3:56 PM 02/02/2024    2:36 PM  Vitals with BMI  Height  5' 8   Weight  164 lbs 13 oz   BMI  25.06   Systolic 149 150 850  Diastolic 75 79 79  Pulse  73 54   Physical Exam  Gen: Alert, well appearing.  Patient is oriented to person, place, time, and situation. AFFECT: pleasant, lucid thought and speech. No further exam today  LABS:  Last CBC Lab Results  Component Value Date   WBC 6.8 04/20/2023   HGB 13.5 04/20/2023   HCT 41.0 04/20/2023   MCV 105.2 (H) 04/20/2023   MCH 32.9 05/02/2022   RDW 15.2 04/20/2023   PLT 219.0 04/20/2023   Last metabolic panel Lab Results  Component Value Date   GLUCOSE 96 02/02/2024   NA 136 02/02/2024   K 5.2 02/02/2024   CL 101 02/02/2024   CO2 21 02/02/2024   BUN 21 02/02/2024   CREATININE 0.87 02/02/2024   EGFR 67 02/02/2024   CALCIUM  9.9 02/02/2024   PROT 7.4 04/20/2023   ALBUMIN 4.8 04/20/2023   BILITOT 1.0 04/20/2023   ALKPHOS 67 04/20/2023   AST 24 04/20/2023   ALT 26 04/20/2023   ANIONGAP 8 05/02/2022   Lab Results  Component Value Date   HGBA1C 6.5 (A) 08/17/2023   Lab Results  Component Value Date   TSH 4.17 08/17/2023  IMPRESSION AND PLAN:  #1 recurrent major depressive disorder, GAD with panic attacks. Improving on Effexor  XR 37.5 mg daily. She prefers to continue this dose for now.  She has alprazolam  to use on an as needed  basis.  #2 diabetes without complication, pretty well-controlled. POC Hba1c today is 6.4% (Her hemoglobin A1c's have ranged 7.0-7.4 over the last few years). She has not had microalbuminuria (last monitoring was March this year). She has neuropathy but we believe this is chemotherapy-induced. Renal function has been normal.  No retinopathy. Continue Amaryl  1 mg daily. An After Visit Summary was printed and given to the patient.  #3 hypertension, labile. Good control. Continue Coreg  3.125 mg twice a day, felodipine  10 mg every morning, and valsartan  160 mg daily.  #4 hypothyroidism. Well-controlled in the last couple of years.  She states she only responds to brand-name Synthroid . New prescription for 112 mcg tabs to take 1 daily today. Next TSH check March 2026.  FOLLOW UP: No follow-ups on file.  Signed:  Gerlene Hockey, MD           03/22/2024

## 2024-03-27 ENCOUNTER — Encounter: Payer: Self-pay | Admitting: Family Medicine

## 2024-03-27 MED ORDER — ATORVASTATIN CALCIUM 80 MG PO TABS: 80.0000 mg | ORAL_TABLET | Freq: Every day | ORAL | 3 refills | Status: AC

## 2024-03-27 NOTE — Telephone Encounter (Signed)
 Ok, rx sent

## 2024-03-28 ENCOUNTER — Ambulatory Visit (HOSPITAL_BASED_OUTPATIENT_CLINIC_OR_DEPARTMENT_OTHER)
Admission: EM | Admit: 2024-03-28 | Discharge: 2024-03-28 | Disposition: A | Discharge status: Home / Self Care | Attending: Family Medicine | Admitting: Family Medicine

## 2024-03-28 ENCOUNTER — Other Ambulatory Visit (HOSPITAL_BASED_OUTPATIENT_CLINIC_OR_DEPARTMENT_OTHER): Payer: Self-pay

## 2024-03-28 ENCOUNTER — Encounter (HOSPITAL_BASED_OUTPATIENT_CLINIC_OR_DEPARTMENT_OTHER): Payer: Self-pay | Admitting: Emergency Medicine

## 2024-03-28 ENCOUNTER — Encounter: Payer: Self-pay | Admitting: Hematology and Oncology

## 2024-03-28 DIAGNOSIS — R300 Dysuria: Secondary | ICD-10-CM | POA: Diagnosis not present

## 2024-03-28 LAB — POCT URINE DIPSTICK
Bilirubin, UA: NEGATIVE
Blood, UA: NEGATIVE
Glucose, UA: NEGATIVE mg/dL
Ketones, POC UA: NEGATIVE mg/dL
Nitrite, UA: POSITIVE — AB
Protein Ur, POC: NEGATIVE mg/dL
Spec Grav, UA: 1.025 (ref 1.010–1.025)
Urobilinogen, UA: 0.2 U/dL
pH, UA: 5 (ref 5.0–8.0)

## 2024-03-28 MED ORDER — NITROFURANTOIN MONOHYD MACRO 100 MG PO CAPS
100.0000 mg | ORAL_CAPSULE | Freq: Two times a day (BID) | ORAL | 0 refills | Status: DC
  Filled 2024-03-28: qty 10, 5d supply, fill #0

## 2024-03-28 MED ORDER — NITROFURANTOIN MONOHYD MACRO 100 MG PO CAPS: 100.0000 mg | ORAL_CAPSULE | Freq: Two times a day (BID) | ORAL | 0 refills | Status: DC

## 2024-03-28 NOTE — Discharge Instructions (Signed)
 Treating for urinary tract infection.  Take the antibiotics as prescribed.  Will send for culture and call with any changes if needed.  Follow-up as needed

## 2024-03-28 NOTE — ED Provider Notes (Signed)
 [facility]    CSN: [id number] Arrival date & time: 03/28/24  1338      History   Chief Complaint Chief Complaint  Patient presents with   Urinary Frequency   Dysuria    HPI Marissa Hall is a 83 y.o. female.   Pt c/o urinary burning and frequency, low grade fever x 2 days.     Urinary Frequency  Dysuria   Past Medical History:  Diagnosis Date   Acute DVT (deep venous thrombosis) (HCC) 04/23/2019   Right popliteal   Alcoholism (HCC)    Allergy    Since a child   Anxiety and depression 1964   oncologist started duloxetine  12/2016   Breast cancer (HCC) 03/14/2016   Clinical stage 2A: (triple neg): Right breast, upper inner quadrant, 03/2016.  Neoadjuvant chemo x 5 cycles,lumpectomy 4 mo later, then RT started 10/2016.  Adjuvant Xeloda  8-10,2018.  SWOG research trial pt 04/2017--pt randomized to pembrolizumab  immunotherapy.  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  A1c Mar 12, 2018 was 7.0% at Dr. Karoline.    Epidermoid cyst of vulva    Chronic epidermoid cyst of the vulva.  Excision done 02/2019   GERD (gastroesophageal reflux disease) 2013   History of basal cell cancer    L ankle   Hyperlipidemia 1986   Hypertension 2008   2022 ->addition of hctz to lisinopril  led to 25ml drop in GFR. HCTZ d/cd.   Hypothyroidism 1988   Diagnosed in her 14s.  Managed by Endocrinologist   Lumbar radiculopathy 04/2019   Dr. Claudene to get plain films of LB and hip (considering MRI due to her hx of cancer)   Nonischemic cardiomyopathy (HCC)     Hx of takotsubo CM   Osteoporosis 2015   pt states osteopenia, but then says that she refused to take the rx med for this condition, so I suspect she had osteoporosis.   Peripheral neuropathy 2017   Patient states diabetic neuropathy in feet prior to starting chemotherapy and then worsened by chemo.    SCC (squamous cell carcinoma), arm, right    Stroke (HCC) 03/2011   Syncope and collapse    05/2021 while in [place] .  No prodrome.  Monitor ordered and cardiology referral ordered 05/14/21   TIA (transient ischemic attack) 03/26/2011   2012: question of (HA + R eye floaters). CT in ED neg acute. Not admitted, no f/u testing done.  ?ocular migraine?    Patient Active Problem List   Diagnosis Date Noted   Atherosclerosis of abdominal aorta 11/16/2023   S/P total hip arthroplasty 10/19/2021   Closed fracture of proximal end of left femur, initial encounter (HCC) 10/06/2021   Hyperkalemia 10/06/2021   Alcohol use 10/06/2021   Malnutrition of moderate degree 10/06/2021   Syncope and collapse 06/10/2021   Closed fracture of multiple ribs of right side 04/30/2021   Acquired hypothyroidism 02/23/2021   Type 2 diabetes mellitus with diabetic polyneuropathy, without long-term current use of insulin  (HCC) 02/23/2021   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   Diabetes mellitus type 2 without  retinopathy (HCC) 09/21/2020   Meibomian gland dysfunction (MGD) of both eyes 09/21/2020   History of COVID-19 06/23/2020   COVID-19 virus infection 05/27/2020   Piriformis syndrome of right side 05/16/2019   Lower extremity pain, right 04/23/2019   DVT (deep venous thrombosis) (HCC) 04/23/2019   Lumbar radiculopathy, right 04/02/2019   Cataracts, bilateral 07/2017   Other long term (current) drug therapy 06/14/2017   Anxiety and depression    History of breast cancer 03/02/2017   History of therapeutic radiation 03/02/2017   Breast pain, right 10/03/2016   Type 2 diabetes mellitus (HCC) 08/23/2016    Hypertension associated with diabetes (HCC) 08/23/2016   Hypothyroidism 08/23/2016   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   Port catheter in place 05/09/2016   Peripheral neuropathy 05/09/2016   Breast cancer of upper-inner quadrant of right female breast (HCC) 03/09/2016   Major depressive disorder, recurrent 02/27/2014   Uncomplicated alcohol dependence (HCC) 02/19/2014   Osteoporosis 2015   GERD (gastroesophageal reflux disease) 2013   TIA (transient ischemic attack) 03/26/2011   Epidermoid cyst of vulva 2008   Hyperlipidemia associated with type 2 diabetes mellitus (HCC) 1986    Past Surgical History:  Procedure Laterality Date   ABDOMINAL HYSTERECTOMY  1972   APPENDECTOMY  1972   BREAST ENHANCEMENT SURGERY  1982   BREAST IMPLANT REMOVAL Right 09/13/2016   Procedure: REMOVAL RIGHT BREAST IMPLANT;  Surgeon: Earlis Ranks, MD;  Location: [facility];  Service: Plastics;  Laterality: Right;   BREAST LUMPECTOMY WITH RADIOACTIVE SEED AND SENTINEL LYMPH NODE BIOPSY Right 09/13/2016   Procedure: RIGHT BREAST LUMPECTOMY WITH RADIOACTIVE SEED X 2 AND SENTINEL LYMPH NODE BIOPSY;  Surgeon: Alm Angle, MD;  Location: [facility];  Service: General;  Laterality: Right;   BREAST SURGERY Right 03/14/2016   Biopsy   CAPSULECTOMY Right 09/13/2016   Procedure: RIGHT CAPSULECTOMY;  Surgeon: Earlis Ranks, MD;  Location: [facility];  Service: Plastics;  Laterality: Right;   CARDIAC CATHETERIZATION  2011   Takotsubo Cardiomyopthy   CATARACT EXTRACTION, BILATERAL Bilateral 08/10/17 right eye, 08/31/17 left eye   FRACTURE SURGERY  2009   JOINT REPLACEMENT  10/06/21   MASS EXCISION Left 02/28/2018   Path: benign.  Procedure: EXCISIONLEFT MEDIAL THIGH MASS ERAS PATHWAY;  Surgeon: Vanderbilt Ned, MD;  Location: [facility];  Service: General;  Laterality: Left;   PORTACATH PLACEMENT N/A 03/15/2016   Procedure: INSERTION PORT-A-CATH WITH  US ;  Surgeon: Alm Angle, MD;  Location: [facility];  Service: General;  Laterality: N/A;   PORTACATH REMOVAL  07/2017   SLEEP STUDY  09/2021   NO SLEEP APNEA   surgical repair left ankle Left 2009   s/p Fall    TONSILLECTOMY AND ADENOIDECTOMY  1948   Age 40   TOTAL HIP ARTHROPLASTY Left 10/06/2021   Procedure: TOTAL HIP ARTHROPLASTY ANTERIOR APPROACH;  Surgeon: Barbarann Oneil BROCKS, MD;  Location: [facility];  Service: Orthopedics;  Laterality: Left;   US  CAROTID DOPPLER BILATERAL ([facility] HX)  01/2021   <50% bilat int carotid sten, otherwise normal.   Zio monitoring     06/2021; sinus, some mild brady, brief SVT, no signif PVCs    OB History     Gravida  1   Para  1   Term      Preterm      AB      Living  1      SAB      IAB  Ectopic      Multiple      Live Births               Home Medications    Prior to Admission medications   Medication Sig Start Date End Date Taking? Authorizing Provider  glimepiride  (AMARYL ) 1 MG tablet Take 1 tablet (1 mg total) by mouth daily with breakfast. 08/17/23  Yes Shamleffer, Ibtehal Jaralla, MD  levothyroxine  (SYNTHROID ) 112 MCG tablet Take 1 tablet (112 mcg total) by mouth daily. 03/22/24  Yes McGowen, Aleene DEL, MD  traZODone  (DESYREL ) 50 MG tablet Take 1 tablet (50 mg total) by mouth at bedtime. 02/02/24  Yes McGowen, Aleene DEL, MD  triamcinolone  cream (KENALOG) 0.1 % Apply 1 Application topically 3 (three) times daily. 03/18/24  Yes [provider]  valsartan  (DIOVAN ) 160 MG tablet Take 1 tablet (160 mg total) by mouth daily. 02/02/24  Yes McGowen, Aleene DEL, MD  venlafaxine  XR (EFFEXOR  XR) 37.5 MG 24 hr capsule Take 1 capsule (37.5 mg total) by mouth daily with breakfast. 03/22/24  Yes McGowen, Aleene DEL, MD  ALPRAZolam  (XANAX ) 0.5 MG tablet 1/2-1 tab every 12 hours as needed for severe anxiety 02/28/24   McGowen, Aleene DEL, MD  atorvastatin  (LIPITOR ) 80 MG tablet Take 1 tablet (80 mg total) by mouth daily. 03/27/24   McGowen,  Aleene DEL, MD  carvedilol  (COREG ) 3.125 MG tablet Take 1 tablet (3.125 mg total) by mouth 2 (two) times daily with a meal. 02/02/24   McGowen, Aleene DEL, MD  cholecalciferol  (VITAMIN D3) 25 MCG (1000 UNIT) tablet Take 1,000 Units by mouth daily.    [provider]  felodipine  (PLENDIL ) 10 MG 24 hr tablet Take 1 tablet (10 mg total) by mouth daily. 02/02/24   McGowen, Aleene DEL, MD  folic acid  (FOLVITE ) 1 MG tablet Take 1 tablet (1 mg total) by mouth daily. 10/14/21   Pokhrel, Laxman, MD  Multiple Vitamin (MULTIVITAMIN WITH MINERALS) TABS tablet Take 1 tablet by mouth daily. 10/14/21   Pokhrel, Laxman, MD  nitrofurantoin, macrocrystal-monohydrate, (MACROBID) 100 MG capsule Take 1 capsule (100 mg total) by mouth 2 (two) times daily. 03/28/24   Adah Corning A, FNP  Propylene Glycol (SYSTANE BALANCE OP) Place 1 drop into both eyes daily as needed (for dry eyes).    [provider]  thiamine  100 MG tablet Take 1 tablet (100 mg total) by mouth daily. 10/14/21   Pokhrel, Vernal, MD    Family History Family History  Problem Relation Age of Onset   Stroke Mother    Alcohol abuse Mother    Anxiety disorder Mother    Arthritis Mother    Depression Mother    Hypertension Mother    Suicidality Father    Alcohol abuse Father    Depression Father    Stroke Brother    Stroke Son    Sleep apnea Son    Diabetes Son     Social History Social History   Tobacco Use   Smoking status: Former    Current packs/day: 0.00    Types: Cigarettes    Quit date: 03/08/1999    Years since quitting: 25.0   Smokeless tobacco: Never  Vaping Use   Vaping status: Never Used  Substance Use Topics   Alcohol use: Yes    Alcohol/week: 7.0 standard drinks of alcohol    Types: 7 Shots of liquor per week    Comment:  daily   Drug use: No     Allergies  Other, Prednisone, Amlodipine , Hydrochlorothiazide , and Penicillins   Review of Systems Review of Systems  Genitourinary:  Positive for dysuria and  frequency.     Physical Exam Triage Vital Signs ED Triage Vitals  Encounter Vitals Group     BP 03/28/24 1352 121/62     Girls Systolic BP Percentile --      Girls Diastolic BP Percentile --      Boys Systolic BP Percentile --      Boys Diastolic BP Percentile --      Pulse Rate 03/28/24 1352 62     Resp 03/28/24 1352 18     Temp 03/28/24 1352 98.4 F (36.9 C)     Temp Source 03/28/24 1352 Oral     SpO2 03/28/24 1352 93 %     Weight --      Height --      Head Circumference --      Peak Flow --      Pain Score 03/28/24 1351 0     Pain Loc --      Pain Education --      Exclude from Growth Chart --    No data found.  Updated Vital Signs BP 121/62 (BP Location: Right Arm)   Pulse 62   Temp 98.4 F (36.9 C) (Oral)   Resp 18   SpO2 93%   Visual Acuity Right Eye Distance:   Left Eye Distance:   Bilateral Distance:    Right Eye Near:   Left Eye Near:    Bilateral Near:     Physical Exam   UC Treatments / Results  Labs (all labs ordered are listed, but only abnormal results are displayed) Labs Reviewed  POCT URINE DIPSTICK - Abnormal; Notable for the following components:      Result Value   Clarity, UA cloudy (*)    Nitrite, UA Positive (*)    Leukocytes, UA Small (1+) (*)    All other components within normal limits  URINE CULTURE    EKG   Radiology No results found.  Procedures Procedures (including critical care time)  Medications Ordered in UC Medications - No data to display  Initial Impression / Assessment and Plan / UC Course  I have reviewed the triage vital signs and the nursing notes.  Pertinent labs & imaging results that were available during my care of the patient were reviewed by me and considered in my medical decision making (see chart for details).     *** Final Clinical Impressions(s) / UC Diagnoses   Final diagnoses:  Dysuria     Discharge Instructions      Treating for urinary tract infection.  Take the  antibiotics as prescribed.  Will send for culture and call with any changes if needed.  Follow-up as needed   ED Prescriptions     Medication Sig Dispense Auth. Provider   nitrofurantoin, macrocrystal-monohydrate, (MACROBID) 100 MG capsule  (Status: Discontinued) Take 1 capsule (100 mg total) by mouth 2 (two) times daily. 10 capsule Shelbey Spindler A, FNP   nitrofurantoin, macrocrystal-monohydrate, (MACROBID) 100 MG capsule Take 1 capsule (100 mg total) by mouth 2 (two) times daily. 10 capsule Adah Wilbert LABOR, FNP      PDMP not reviewed this encounter.

## 2024-03-28 NOTE — ED Triage Notes (Signed)
 Pt c/o urinary burning and frequency, low grade fever x 2 days.

## 2024-03-30 LAB — URINE CULTURE: Culture: >=100000 — AB

## 2024-04-01 ENCOUNTER — Ambulatory Visit (HOSPITAL_COMMUNITY): Payer: Self-pay

## 2024-04-01 DIAGNOSIS — D045 Carcinoma in situ of skin of trunk: Secondary | ICD-10-CM | POA: Diagnosis not present

## 2024-04-01 DIAGNOSIS — D0462 Carcinoma in situ of skin of left upper limb, including shoulder: Secondary | ICD-10-CM | POA: Diagnosis not present

## 2024-04-01 DIAGNOSIS — C44629 Squamous cell carcinoma of skin of left upper limb, including shoulder: Secondary | ICD-10-CM | POA: Diagnosis not present

## 2024-04-08 ENCOUNTER — Ambulatory Visit (INDEPENDENT_AMBULATORY_CARE_PROVIDER_SITE_OTHER): Admitting: Family Medicine

## 2024-04-08 ENCOUNTER — Ambulatory Visit: Payer: Self-pay

## 2024-04-08 VITALS — BP 128/78 | HR 62 | Temp 98.4°F | Wt 166.0 lb

## 2024-04-08 DIAGNOSIS — R350 Frequency of micturition: Secondary | ICD-10-CM

## 2024-04-08 DIAGNOSIS — N3000 Acute cystitis without hematuria: Secondary | ICD-10-CM

## 2024-04-08 LAB — POC URINALSYSI DIPSTICK (AUTOMATED)
Bilirubin, UA: NEGATIVE
Glucose, UA: NEGATIVE
Ketones, UA: NEGATIVE
Nitrite, UA: NEGATIVE
Protein, UA: NEGATIVE
Spec Grav, UA: 1.025 (ref 1.010–1.025)
Urobilinogen, UA: NEGATIVE U/dL — AB
pH, UA: 5.5 (ref 5.0–8.0)

## 2024-04-08 MED ORDER — CEPHALEXIN 500 MG PO CAPS: 500.0000 mg | ORAL_CAPSULE | Freq: Three times a day (TID) | ORAL | 0 refills | Status: AC

## 2024-04-08 NOTE — Telephone Encounter (Signed)
  FYI Only or Action Required?: FYI only for provider: appointment scheduled on 04/08/24.  Patient was last seen in primary care on 03/22/2024 by McGowen, Aleene DEL, MD.  Called Nurse Triage reporting Urinary Frequency.  Symptoms began several days ago.  Interventions attempted: Prescription medications: finished Macrobid and Rest, hydration, or home remedies.  Symptoms are: gradually worsening.  Triage Disposition: See HCP Within 4 Hours (Or PCP Triage)  Patient/caregiver understands and will follow disposition?: Yes, but will wait   Copied from CRM [id number]. Topic: Clinical - Red Word Triage >> Apr 08, 2024  9:35 AM Revonda D wrote: Red Word that prompted transfer to Nurse Triage: pain, cramps  Pt stated that she thinks her UTI is back and is currently experiencing cramps, back pain, and has a slight fever. Pt would like to schedule an appt with her provider today. Reason for Disposition  [1] Taking antibiotic > 24 hours for UTI AND [2] flank or lower back pain getting WORSE  Answer Assessment - Initial Assessment Questions 1. MAIN SYMPTOM: What is the main symptom you are concerned about? (e.g., painful urination, urine frequency)     Painful urination 2. BETTER-SAME-WORSE: Are you getting better, staying the same, or getting worse compared to how you felt at your last visit to the doctor (most recent medical visit)?     Went away and now back for few days, not quite as bad 3. PAIN: How bad is the pain?  (e.g., Scale 1-10; mild, moderate, or severe)     Cramping abdominal  4. FEVER: Do you have a fever? If Yes, ask: What is it, how was it measured, and when did it start?     99.8 5. OTHER SYMPTOMS: Do you have any other symptoms? (e.g., blood in the urine, flank pain, vaginal discharge)     New back pain, diarrhea 6. DIAGNOSIS: When was the UTI diagnosed? By whom? Was it a kidney infection, bladder infection or both?     10/23/ by UC 7. ANTIBIOTIC: What  antibiotic(s) are you taking? How many times per day?     Macrobid -finished  Protocols used: Urinary Tract Infection on Antibiotic Follow-up Call - [facility]

## 2024-04-08 NOTE — Progress Notes (Signed)
 Marissa Hall , 11-04-1940, 83 y.o., female MRN: [id number] Patient Care Team    Relationship Specialty Notifications Start End  McGowen, Aleene DEL, MD PCP - General Family Medicine  10/28/16   Ethyl Lenis, MD Consulting Physician General Surgery  03/07/16   Odean Potts, MD Consulting Physician Hematology and Oncology  03/07/16   Dewey Rush, MD Consulting Physician Radiation Oncology  03/07/16   Arelia Filippo, MD Consulting Physician Plastic Surgery  09/13/16   Crawford Morna Pickle, NP Nurse Practitioner Hematology and Oncology  02/14/17   Tsamis, Comer Pillow, MD Referring Physician Ophthalmology  04/07/21   Shamleffer, Donell Cardinal, MD Consulting Physician Endocrinology  07/13/21     Chief Complaint  Patient presents with   Urinary Frequency    Was in UC for urinary frequency, dysuria, 10/23. Ongoing urinary frequency, abdominal discomfort.      Subjective: Marissa Hall is a 83 y.o. Pt presents for an OV with complaints of urinary frequency and dysuria of greater than 1 week duration.  Associated symptoms include suprapubic pressure.  No low back pain or fever reported. Patient tolerating p.o. No kidney stone history. Pt has tried partial course of Macrobid to ease their symptoms. Recent pansensitive E. coli UTI 03/28/2024.  Treated with Macrobid.  She feels Macrobid made her nauseous and had diarrhea.     02/02/2024    2:30 PM 09/20/2023   10:23 AM 08/22/2023   11:10 AM 04/20/2023    2:01 PM 11/11/2022    3:15 PM  Depression screen PHQ 2/9  Decreased Interest 0 0 0 0 1  Down, Depressed, Hopeless 0 0 0 0 0  PHQ - 2 Score 0 0 0 0 1  Altered sleeping 3 3 0  0  Tired, decreased energy 3 3 0  2  Change in appetite 0 0 0  0  Feeling bad or failure about yourself  0 0 0  0  Trouble concentrating 2 0 0  1  Moving slowly or fidgety/restless 0 0 0  0  Suicidal thoughts 0 0 0  0  PHQ-9 Score 8 6 0  4  Difficult doing work/chores Somewhat difficult Somewhat  difficult Not difficult at all  Somewhat difficult    Allergies  Allergen Reactions   Other Other (See Comments)    STEROIDS- emotional   Prednisone Other (See Comments)    Other reaction(s): Mental Status Changes (intolerance)   Amlodipine  Other (See Comments)    Elevated creatinine   Hydrochlorothiazide  Other (See Comments)    Elevated serum creatinine   Penicillins Other (See Comments)    Unsure of reaction, was 83 years old   Social History   Social History Narrative   Divorced, has a son who lives in [place] .   Educ: college   Occup: airline pilot, still working part time.   Tob: quit 2000.     Alc: several times a week--1-2 glasses wine.   No drugs.   Past Medical History:  Diagnosis Date   Acute DVT (deep venous thrombosis) (HCC) 04/23/2019   Right popliteal   Alcoholism (HCC)    Allergy    Since a child   Anxiety and depression 1964   oncologist started duloxetine  12/2016   Breast cancer (HCC) 03/14/2016   Clinical stage 2A: (triple neg): Right breast, upper inner quadrant, 03/2016.  Neoadjuvant chemo x 5 cycles,lumpectomy 4 mo later, then RT started 10/2016.  Adjuvant Xeloda  8-10,2018.  SWOG research trial pt 04/2017--pt randomized to pembrolizumab  immunotherapy.  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 Cancer-free at 05/2018 onc f/u.   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  A1c Mar 12, 2018 was 7.0% at Dr. Karoline.    Epidermoid cyst of vulva    Chronic epidermoid cyst of the vulva.  Excision done 02/2019   GERD (gastroesophageal reflux disease) 2013   History of basal cell cancer    L ankle   Hyperlipidemia  1986   Hypertension 2008   2022 ->addition of hctz to lisinopril  led to 25ml drop in GFR. HCTZ d/cd.   Hypothyroidism 1988   Diagnosed in her 63s.  Managed by Endocrinologist   Lumbar radiculopathy 04/2019   Dr. Claudene to get plain films of LB and hip (considering MRI due to her hx of cancer)   Nonischemic cardiomyopathy (HCC)    Hx of takotsubo CM   Osteoporosis 2015   pt states osteopenia, but then says that she refused to take the rx med for this condition, so I suspect she had osteoporosis.   Peripheral neuropathy 2017   Patient states diabetic neuropathy in feet prior to starting chemotherapy and then worsened by chemo.    SCC (squamous cell carcinoma), arm, right    Stroke (HCC) 03/2011   Syncope and collapse    05/2021 while in [place] .  No prodrome.  Monitor ordered and cardiology referral ordered 05/14/21   TIA (transient ischemic attack) 03/26/2011   2012: question of (HA + R eye floaters). CT in ED neg acute. Not admitted, no f/u testing done.  ?ocular migraine?   Past Surgical History:  Procedure Laterality Date   ABDOMINAL HYSTERECTOMY  1972   APPENDECTOMY  1972   BREAST ENHANCEMENT SURGERY  1982   BREAST IMPLANT REMOVAL Right 09/13/2016   Procedure: REMOVAL RIGHT BREAST IMPLANT;  Surgeon: Earlis Ranks, MD;  Location: [facility];  Service: Plastics;  Laterality: Right;   BREAST LUMPECTOMY WITH RADIOACTIVE SEED AND SENTINEL LYMPH NODE BIOPSY Right 09/13/2016   Procedure: RIGHT BREAST LUMPECTOMY WITH RADIOACTIVE SEED X 2 AND SENTINEL LYMPH NODE BIOPSY;  Surgeon: Alm Angle, MD;  Location: [facility];  Service: General;  Laterality: Right;   BREAST SURGERY Right 03/14/2016   Biopsy   CAPSULECTOMY Right 09/13/2016   Procedure: RIGHT CAPSULECTOMY;  Surgeon: Earlis Ranks, MD;  Location: [facility];  Service: Plastics;  Laterality: Right;   CARDIAC CATHETERIZATION  2011   Takotsubo Cardiomyopthy   CATARACT EXTRACTION,  BILATERAL Bilateral 08/10/17 right eye, 08/31/17 left eye   FRACTURE SURGERY  2009   JOINT REPLACEMENT  10/06/21   MASS EXCISION Left 02/28/2018   Path: benign.  Procedure: EXCISIONLEFT MEDIAL THIGH MASS ERAS PATHWAY;  Surgeon: Vanderbilt Ned, MD;  Location: [facility];  Service: General;  Laterality: Left;   PORTACATH PLACEMENT N/A 03/15/2016   Procedure: INSERTION PORT-A-CATH WITH US ;  Surgeon: Alm Angle, MD;  Location: [facility];  Service: General;  Laterality: N/A;   PORTACATH REMOVAL  07/2017   SLEEP STUDY  09/2021   NO SLEEP APNEA   surgical repair left ankle Left 2009   s/p Fall    TONSILLECTOMY AND  ADENOIDECTOMY  1948   Age 19   TOTAL HIP ARTHROPLASTY Left 10/06/2021   Procedure: TOTAL HIP ARTHROPLASTY ANTERIOR APPROACH;  Surgeon: Barbarann Oneil BROCKS, MD;  Location: [facility];  Service: Orthopedics;  Laterality: Left;   US  CAROTID DOPPLER BILATERAL ([facility] HX)  01/2021   <50% bilat int carotid sten, otherwise normal.   Zio monitoring     06/2021; sinus, some mild brady, brief SVT, no signif PVCs   Family History  Problem Relation Age of Onset   Stroke Mother    Alcohol abuse Mother    Anxiety disorder Mother    Arthritis Mother    Depression Mother    Hypertension Mother    Suicidality Father    Alcohol abuse Father    Depression Father    Stroke Brother    Stroke Son    Sleep apnea Son    Diabetes Son    Allergies as of 04/08/2024       Reactions   Other Other (See Comments)   STEROIDS- emotional   Prednisone Other (See Comments)   Other reaction(s): Mental Status Changes (intolerance)   Amlodipine  Other (See Comments)   Elevated creatinine   Hydrochlorothiazide  Other (See Comments)   Elevated serum creatinine   Penicillins Other (See Comments)   Unsure of reaction, was 83 years old        Medication List        Accurate as of April 08, 2024  2:58 PM. If you have any questions, ask your nurse or doctor.          STOP taking these medications     nitrofurantoin (macrocrystal-monohydrate) 100 MG capsule Commonly known as: MACROBID Stopped by: Charlies Bellini       TAKE these medications    ALPRAZolam  0.5 MG tablet Commonly known as: XANAX  1/2-1 tab every 12 hours as needed for severe anxiety   atorvastatin  80 MG tablet Commonly known as: LIPITOR  Take 1 tablet (80 mg total) by mouth daily.   carvedilol  3.125 MG tablet Commonly known as: COREG  Take 1 tablet (3.125 mg total) by mouth 2 (two) times daily with a meal.   cephALEXin  500 MG capsule Commonly known as: KEFLEX  Take 1 capsule (500 mg total) by mouth 3 (three) times daily. Started by: Leonce Bale   cholecalciferol  25 MCG (1000 UNIT) tablet Commonly known as: VITAMIN D3 Take 1,000 Units by mouth daily.   felodipine  10 MG 24 hr tablet Commonly known as: PLENDIL  Take 1 tablet (10 mg total) by mouth daily.   folic acid  1 MG tablet Commonly known as: FOLVITE  Take 1 tablet (1 mg total) by mouth daily.   glimepiride  1 MG tablet Commonly known as: AMARYL  Take 1 tablet (1 mg total) by mouth daily with breakfast.   levothyroxine  112 MCG tablet Commonly known as: SYNTHROID  Take 1 tablet (112 mcg total) by mouth daily.   multivitamin with minerals Tabs tablet Take 1 tablet by mouth daily.   SYSTANE BALANCE OP Place 1 drop into both eyes daily as needed (for dry eyes).   thiamine  100 MG tablet Commonly known as: VITAMIN B1 Take 1 tablet (100 mg total) by mouth daily.   traZODone  50 MG tablet Commonly known as: DESYREL  Take 1 tablet (50 mg total) by mouth at bedtime.   triamcinolone  cream 0.1 % Commonly known as: KENALOG Apply 1 Application topically 3 (three) times daily.   valsartan  160 MG tablet Commonly known as: DIOVAN  Take 1 tablet (160 mg total) by mouth daily.  venlafaxine  XR 37.5 MG 24 hr capsule Commonly known as: Effexor  XR Take 1 capsule (37.5 mg total) by mouth daily with breakfast.        All past medical history, surgical  history, allergies, family history, immunizations andmedications were updated in the EMR today and reviewed under the history and medication portions of their EMR.     ROS Negative, with the exception of above mentioned in HPI   Objective:  BP 128/78   Pulse 62   Temp 98.4 F (36.9 C)   Wt 166 lb (75.3 kg)   SpO2 98%   BMI 25.24 kg/m  Body mass index is 25.24 kg/m. Physical Exam Vitals and nursing note reviewed.  Constitutional:      General: She is not in acute distress.    Appearance: Normal appearance. She is normal weight. She is not ill-appearing or toxic-appearing.  HENT:     Head: Normocephalic and atraumatic.  Eyes:     General: No scleral icterus.       Right eye: No discharge.        Left eye: No discharge.     Extraocular Movements: Extraocular movements intact.     Conjunctiva/sclera: Conjunctivae normal.     Pupils: Pupils are equal, round, and reactive to light.  Cardiovascular:     Rate and Rhythm: Normal rate and regular rhythm.  Abdominal:     General: Abdomen is flat. Bowel sounds are normal. There is no distension.     Palpations: Abdomen is soft.     Tenderness: There is abdominal tenderness (Mild suprapubic tenderness). There is no right CVA tenderness, left CVA tenderness, guarding or rebound.  Skin:    Findings: No rash.  Neurological:     Mental Status: She is alert and oriented to person, place, and time. Mental status is at baseline.     Motor: No weakness.     Coordination: Coordination normal.     Gait: Gait normal.  Psychiatric:        Mood and Affect: Mood normal.        Behavior: Behavior normal.        Thought Content: Thought content normal.        Judgment: Judgment normal.      No results found. No results found. Results for orders placed or performed in visit on 04/08/24 (from the past 24 hours)  POCT Urinalysis Dipstick (Automated)     Status: Abnormal   Collection Time: 04/08/24  2:36 PM  Result Value Ref Range   Color,  UA yellow    Clarity, UA clear    Glucose, UA Negative Negative   Bilirubin, UA negative    Ketones, UA negative    Spec Grav, UA 1.025 1.010 - 1.025   Blood, UA none    pH, UA 5.5 5.0 - 8.0   Protein, UA Negative Negative   Urobilinogen, UA negative (A) 0.2 or 1.0 E.U./dL   Nitrite, UA negative    Leukocytes, UA Small (1+) (A) Negative    Assessment/Plan: Marissa Hall is a 83 y.o. female present for OV for  Urinary frequency/Acute cystitis without hematuria (Primary) - POCT Urinalysis Dipstick (Automated)>+ leuks 1+, neg blood and nitrites. - Urinalysis, Routine w reflex microscopic> pending Start Keflex  3 times daily lobulated on urine culture. Suspect undertreated UTI due to patient not tolerating Macrobid well. Patient will be called with culture results.  Reviewed expectations re: course of current medical issues. Discussed self-management of symptoms. Outlined signs and symptoms indicating  need for more acute intervention. Patient verbalized understanding and all questions were answered. Patient received an After-Visit Summary.    Orders Placed This Encounter  Procedures   Urinalysis, Routine w reflex microscopic   POCT Urinalysis Dipstick (Automated)   Meds ordered this encounter  Medications   cephALEXin  (KEFLEX ) 500 MG capsule    Sig: Take 1 capsule (500 mg total) by mouth 3 (three) times daily.    Dispense:  21 capsule    Refill:  0   Referral Orders  No referral(s) requested today     Note is dictated utilizing voice recognition software. Although note has been proof read prior to signing, occasional typographical errors still can be missed. If any questions arise, please do not hesitate to call for verification.   electronically signed by:  Charlies Bellini, DO  [facility] - OR

## 2024-04-08 NOTE — Patient Instructions (Addendum)

## 2024-04-09 LAB — URINALYSIS, ROUTINE W REFLEX MICROSCOPIC
Bilirubin Urine: NEGATIVE
Hgb urine dipstick: NEGATIVE
Ketones, ur: NEGATIVE
Nitrite: NEGATIVE
RBC / HPF: NONE SEEN (ref 0–?)
Specific Gravity, Urine: 1.025 (ref 1.000–1.030)
Total Protein, Urine: NEGATIVE
Urine Glucose: NEGATIVE
Urobilinogen, UA: 0.2 (ref 0.0–1.0)
pH: 5.5 (ref 5.0–8.0)

## 2024-04-12 NOTE — Telephone Encounter (Signed)
 No further action needed at this time.

## 2024-04-23 ENCOUNTER — Ambulatory Visit: Admitting: Family Medicine

## 2024-04-23 NOTE — Progress Notes (Deleted)
 OFFICE VISIT  04/23/2024  CC: No chief complaint on file.   Patient is a 82 y.o. female who presents for 2-week follow-up urinary concerns.  INTERIM HX: *** She was seen for persistent UTI symptoms 04/08/2024.  Macrobid had not helped and then made her feel sick.  She was switched to Keflex .  Culture was not sent that day but a culture on 03/28/2024 showed E. coli, pansensitive.   Past Medical History:  Diagnosis Date   Acute DVT (deep venous thrombosis) (HCC) 04/23/2019   Right popliteal   Alcoholism (HCC)    Allergy    Since a child   Anxiety and depression 1964   oncologist started duloxetine  12/2016   Breast cancer (HCC) 03/14/2016   Clinical stage 2A: (triple neg): Right breast, upper inner quadrant, 03/2016.  Neoadjuvant chemo x 5 cycles,lumpectomy 4 mo later, then RT started 10/2016.  Adjuvant Xeloda  8-10,2018.  SWOG research trial pt 04/2017--pt randomized to pembrolizumab  immunotherapy.  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  A1c Mar 12, 2018 was 7.0% at Dr. Karoline.    Epidermoid cyst of vulva    Chronic epidermoid cyst of the vulva.  Excision done 02/2019   GERD (gastroesophageal reflux disease) 2013   History of basal cell cancer    L ankle   Hyperlipidemia 1986   Hypertension 2008   2022 ->addition of hctz to lisinopril  led to 25ml drop in GFR. HCTZ d/cd.   Hypothyroidism 1988   Diagnosed in her 42s.  Managed by Endocrinologist   Lumbar radiculopathy 04/2019   Dr. Claudene to get plain films of LB and hip (considering MRI due to her hx  of cancer)   Nonischemic cardiomyopathy (HCC)    Hx of takotsubo CM   Osteoporosis 2015   pt states osteopenia, but then says that she refused to take the rx med for this condition, so I suspect she had osteoporosis.   Peripheral neuropathy 2017   Patient states diabetic neuropathy in feet prior to starting chemotherapy and then worsened by chemo.    SCC (squamous cell carcinoma), arm, right    Stroke (HCC) 03/2011   Syncope and collapse    05/2021 while in [place] .  No prodrome.  Monitor ordered and cardiology referral ordered 05/14/21   TIA (transient ischemic attack) 03/26/2011   2012: question of (HA + R eye floaters). CT in ED neg acute. Not admitted, no f/u testing done.  ?ocular migraine?    Past Surgical History:  Procedure Laterality Date   ABDOMINAL HYSTERECTOMY  1972   APPENDECTOMY  1972   BREAST ENHANCEMENT SURGERY  1982   BREAST IMPLANT REMOVAL Right 09/13/2016   Procedure: REMOVAL RIGHT BREAST IMPLANT;  Surgeon: Earlis Ranks, MD;  Location: [facility];  Service: Plastics;  Laterality: Right;   BREAST LUMPECTOMY WITH RADIOACTIVE SEED AND SENTINEL LYMPH NODE BIOPSY Right 09/13/2016   Procedure: RIGHT BREAST LUMPECTOMY WITH RADIOACTIVE SEED X 2 AND SENTINEL LYMPH NODE BIOPSY;  Surgeon: Alm Angle, MD;  Location: [facility];  Service: General;  Laterality: Right;  BREAST SURGERY Right 03/14/2016   Biopsy   CAPSULECTOMY Right 09/13/2016   Procedure: RIGHT CAPSULECTOMY;  Surgeon: Earlis Ranks, MD;  Location: [facility];  Service: Plastics;  Laterality: Right;   CARDIAC CATHETERIZATION  2011   Takotsubo Cardiomyopthy   CATARACT EXTRACTION, BILATERAL Bilateral 08/10/17 right eye, 08/31/17 left eye   FRACTURE SURGERY  2009   JOINT REPLACEMENT  10/06/21   MASS EXCISION Left 02/28/2018   Path: benign.  Procedure: EXCISIONLEFT MEDIAL THIGH MASS ERAS PATHWAY;  Surgeon: Vanderbilt Ned, MD;  Location: [facility];   Service: General;  Laterality: Left;   PORTACATH PLACEMENT N/A 03/15/2016   Procedure: INSERTION PORT-A-CATH WITH US ;  Surgeon: Alm Angle, MD;  Location: [facility];  Service: General;  Laterality: N/A;   PORTACATH REMOVAL  07/2017   SLEEP STUDY  09/2021   NO SLEEP APNEA   surgical repair left ankle Left 2009   s/p Fall    TONSILLECTOMY AND ADENOIDECTOMY  1948   Age 45   TOTAL HIP ARTHROPLASTY Left 10/06/2021   Procedure: TOTAL HIP ARTHROPLASTY ANTERIOR APPROACH;  Surgeon: Barbarann Oneil BROCKS, MD;  Location: [facility];  Service: Orthopedics;  Laterality: Left;   US  CAROTID DOPPLER BILATERAL ([facility] HX)  01/2021   <50% bilat int carotid sten, otherwise normal.   Zio monitoring     06/2021; sinus, some mild brady, brief SVT, no signif PVCs    Outpatient Medications Prior to Visit  Medication Sig Dispense Refill   ALPRAZolam  (XANAX ) 0.5 MG tablet 1/2-1 tab every 12 hours as needed for severe anxiety 30 tablet 1   atorvastatin  (LIPITOR ) 80 MG tablet Take 1 tablet (80 mg total) by mouth daily. 90 tablet 3   carvedilol  (COREG ) 3.125 MG tablet Take 1 tablet (3.125 mg total) by mouth 2 (two) times daily with a meal. 180 tablet 1   cephALEXin  (KEFLEX ) 500 MG capsule Take 1 capsule (500 mg total) by mouth 3 (three) times daily. 21 capsule 0   cholecalciferol  (VITAMIN D3) 25 MCG (1000 UNIT) tablet Take 1,000 Units by mouth daily.     felodipine  (PLENDIL ) 10 MG 24 hr tablet Take 1 tablet (10 mg total) by mouth daily. 90 tablet 3   folic acid  (FOLVITE ) 1 MG tablet Take 1 tablet (1 mg total) by mouth daily.     glimepiride  (AMARYL ) 1 MG tablet Take 1 tablet (1 mg total) by mouth daily with breakfast. 90 tablet 3   levothyroxine  (SYNTHROID ) 112 MCG tablet Take 1 tablet (112 mcg total) by mouth daily. 90 tablet 3   Multiple Vitamin (MULTIVITAMIN WITH MINERALS) TABS tablet Take 1 tablet by mouth daily.     Propylene Glycol (SYSTANE BALANCE OP) Place 1 drop into both eyes daily as needed (for dry eyes).     thiamine   100 MG tablet Take 1 tablet (100 mg total) by mouth daily.     traZODone  (DESYREL ) 50 MG tablet Take 1 tablet (50 mg total) by mouth at bedtime. 30 tablet 6   triamcinolone  cream (KENALOG) 0.1 % Apply 1 Application topically 3 (three) times daily.     valsartan  (DIOVAN ) 160 MG tablet Take 1 tablet (160 mg total) by mouth daily. 90 tablet 3   venlafaxine  XR (EFFEXOR  XR) 37.5 MG 24 hr capsule Take 1 capsule (37.5 mg total) by mouth daily with breakfast. 90 capsule 1   No facility-administered medications prior to visit.    Allergies  Allergen Reactions   Other Other (See Comments)    STEROIDS-  emotional   Prednisone Other (See Comments)    Other reaction(s): Mental Status Changes (intolerance)   Amlodipine  Other (See Comments)    Elevated creatinine   Hydrochlorothiazide  Other (See Comments)    Elevated serum creatinine   Penicillins Other (See Comments)    Unsure of reaction, was 83 years old    Review of Systems As per HPI  PE:    04/08/2024    2:26 PM 03/28/2024    1:52 PM 03/22/2024    4:02 PM  Vitals with BMI  Weight 166 lbs    BMI 25.25    Systolic 128 121 850  Diastolic 78 62 75  Pulse 62 62      Physical Exam  ***  LABS:  {Labs (Optional):23779}  IMPRESSION AND PLAN:  No problem-specific Assessment & Plan notes found for this encounter.   An After Visit Summary was printed and given to the patient.  FOLLOW UP: No follow-ups on file.  @esig @

## 2024-04-26 ENCOUNTER — Encounter: Payer: Self-pay | Admitting: Family Medicine

## 2024-05-20 ENCOUNTER — Telehealth: Payer: Self-pay | Admitting: Hematology and Oncology

## 2024-05-20 ENCOUNTER — Telehealth: Payer: Self-pay | Admitting: *Deleted

## 2024-05-20 NOTE — Telephone Encounter (Signed)
 [id number] - Called patient to reschedule her study visit with Dr. Odean per scheduling message that patient requested the appointment tomorrow be rescheduled.  Left my phone number and informed patient the visit should be rescheduled before 06/20/24 to be within the study visit window. Asked patient to return call so we can reschedule at a time that will work for patient's schedule.  Cherylyn Hoard, BSN, RN, [employer] [facility] Nurse II [phone] 05/20/2024 1:44 PM

## 2024-05-20 NOTE — Telephone Encounter (Signed)
 Patient called to reschedule her appointment for tomorrow. Per the note by her appt it says to call Came[facility] in [facility] before rescheduling. I left a voicemail letting her know that this patient needs to be rescheduled.

## 2024-05-21 ENCOUNTER — Inpatient Hospital Stay: Payer: Medicare Other | Admitting: Hematology and Oncology

## 2024-05-21 ENCOUNTER — Telehealth: Payer: Self-pay | Admitting: *Deleted

## 2024-05-21 NOTE — Telephone Encounter (Signed)
 [id number] Called patient to reschedule her appointment today as she requested.  Patient can come on 06/12/24 at 10:15 am.  Message request sent to scheduler for this date/time.  Patient is aware and will call if she needs to change anything.  Cherylyn Hoard, BSN, RN, [employer] [facility] Nurse II [phone] 05/21/2024 11:48 AM

## 2024-06-12 ENCOUNTER — Inpatient Hospital Stay: Admitting: Hematology and Oncology

## 2024-06-12 ENCOUNTER — Telehealth: Payer: Self-pay | Admitting: *Deleted

## 2024-06-12 NOTE — Telephone Encounter (Signed)
 [id number] Patient did not show for her scheduled appointment today. Patient states she forgot and was apologetic. She agrees to have appointment rescheduled prior to 06/25/24 if Dr. Gudena has availability. The last day for this protocol allowed study visit is 06/25/24.  Request message sent to Scheduler. Confirmed patient has my phone number to call if any questions or changes before next visit.  Cherylyn Hoard, BSN, RN, [facility] Clinical Research Nurse II [phone] 06/12/2024 2:03 PM

## 2024-06-17 ENCOUNTER — Inpatient Hospital Stay: Attending: Hematology and Oncology | Admitting: Hematology and Oncology

## 2024-06-17 ENCOUNTER — Encounter: Payer: Self-pay | Admitting: *Deleted

## 2024-06-17 VITALS — BP 162/54 | HR 69 | Temp 98.1°F | Resp 16 | Wt 166.4 lb

## 2024-06-17 DIAGNOSIS — Z006 Encounter for examination for normal comparison and control in clinical research program: Secondary | ICD-10-CM

## 2024-06-17 DIAGNOSIS — Z853 Personal history of malignant neoplasm of breast: Secondary | ICD-10-CM

## 2024-06-17 DIAGNOSIS — C50211 Malignant neoplasm of upper-inner quadrant of right female breast: Secondary | ICD-10-CM

## 2024-06-17 NOTE — Assessment & Plan Note (Signed)
 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    T2 N0 stage 2A clinical stage   Treatment summary: 1. Neoadjuvant chemotherapy with dose dense Adriamycin  and Cytoxan  4 followed by Abraxane  weekly 5  (stopped early due to neuropathy) 2. 09/13/2016: Right lumpectomy: IDC grade 3, 2 foci, 2 cm and 1.1 cm, 0/3 lymph nodes negative margins negative, ER 0%, PR 0%, HER-2 negative ratio 1.02, Ki-67 60%, RCB-II; ypT2ypN0 Stage 2A (with plastic surgery removing the ruptured implant) 3. Followed by radiation therapy completed 12/06/2016 4. Adjuvant Xeloda  1000 mg by mouth twice a day 2 weeks on one week off 12/30/2016- Oct 2018 5. SWOG 1418 clinical trial Pembrolizumab  cycle 1 given 05/03/2017 (patient decided to discontinue clinical trial) ----------------------------------------------------------------------------------------------------------------------------------------- Surveillance: 1.  Breast exam 06/17/2024: Benign, no palpable lumps or nodules of concern. 2. mammogram 06/30/2023 breast density category A     Chronic fatigue Chemo-induced peripheral neuropathy: Patient rates it as 5 out of 10 Hospitalization 10/05/2021-10/13/2021: Fracture of the proximal end of left femur: Total hip arthroplasty on 10/06/2021   She has multiple animals at home. she stays busy  Return to clinic in 1 year for follow-up

## 2024-06-17 NOTE — Progress Notes (Signed)
 "  Patient Care Team: Candise Aleene DEL, MD as PCP - General (Family Medicine) Ethyl Lenis, MD as Consulting Physician (General Surgery) Odean Potts, MD as Consulting Physician (Hematology and Oncology) Dewey Rush, MD as Consulting Physician (Radiation Oncology) Arelia Filippo, MD as Consulting Physician (Plastic Surgery) Crawford, Morna Pickle, NP as Nurse Practitioner (Hematology and Oncology) Tsamis, Comer Pillow, MD as Referring Physician (Ophthalmology) [facility], Donell Cardinal, MD as Consulting Physician (Endocrinology)  DIAGNOSIS:  Encounter Diagnosis  Name Primary?   History of breast cancer Yes    SUMMARY OF ONCOLOGIC HISTORY: Oncology History  History of breast cancer  02/29/2016 Initial Diagnosis   Right breast palpable mass (with silicone implants 1982), 3.5 cm on MRI, additional 3 cm anterior linear enhancement? 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 Ki-67 60%, T2 N0 stage 2A clinical stage   03/14/2016 Procedure   Right breast biopsy upper inner quadrant: IDC grade 3   03/28/2016 -  Neo-Adjuvant Chemotherapy   Neoadjuvant chemotherapy with dose dense Adriamycin  and Cytoxan  followed by Abraxane  weekly 5 ( patient is diabetic and cannot take steroids)   07/15/2016 Breast MRI   Right breast: Spiculated mass 1.5 cm significantly smaller compared to prior,NME previously seen is not noted, no abnormal lymph nodes   09/13/2016 Surgery   Removal of the silicone implant due to intracapsular rupture and capsulectomy (Dr.Thimappa)   09/13/2016 Surgery   Right lumpectomy: IDC grade 3, 2 foci, 2 cm and 1.1 cm, 0/3 lymph nodes negative margins negative, ER 0%, PR 0%, HER-2 negative ratio 1.02, Ki-67 60%, RCB-II; ypT2ypN0 Stage 2A    10/20/2016 - 12/06/2016 Radiation Therapy   Adjuvant radiation therapy   12/30/2016 - 04/05/2017 Chemotherapy   Xeloda  1000 mg by mouth twice a day adjuvant therapy x 4 cycles    05/03/2017 - 05/24/2017 Chemotherapy   SWOG S  1418 Pembrolizumab  on clinical trial stopped after 2 doses by patient preference (not due to toxicities .)     CHIEF COMPLIANT: Surveillance of breast cancer  HISTORY OF PRESENT ILLNESS:   History of Present Illness Marissa Hall is an 84 year old female with 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  She is 8.5 years from her initial diagnosis. Her most recent surveillance mammogram showed category A breast density. She has no breast pain, masses, or changes. She has a left breast implant and notes occasional asymmetry without pain or other implant-related symptoms.  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 Over the holidays she noticed increased cold sensation in her legs that improved with wool socks and leg warmers. She previously did not benefit from duloxetine  and is now on venlafaxine  XR, which she finds more effective for her depression.  She recently had a facial skin cancer lesion biopsied and is considering topical treatment instead of surgery because of the location and her desire to avoid procedures in that area.       ALLERGIES:  is allergic to other, prednisone, amlodipine , hydrochlorothiazide , and penicillins.  MEDICATIONS:  Current Outpatient Medications  Medication Sig Dispense Refill   ALPRAZolam  (XANAX ) 0.5 MG tablet 1/2-1 tab every 12 hours as needed for severe anxiety 30 tablet 1   atorvastatin  (LIPITOR ) 80 MG tablet Take 1 tablet (80 mg total) by mouth daily. 90 tablet 3   carvedilol  (COREG ) 3.125 MG tablet Take 1 tablet (3.125 mg total) by mouth 2 (two) times daily with a meal. 180 tablet 1   cephALEXin  (KEFLEX ) 500 MG  capsule Take 1 capsule (500 mg total) by mouth 3 (three) times daily. 21 capsule 0   cholecalciferol  (VITAMIN D3) 25 MCG (1000 UNIT) tablet Take 1,000 Units by mouth daily. (Patient not taking: Reported on 06/17/2024)     felodipine   (PLENDIL ) 10 MG 24 hr tablet Take 1 tablet (10 mg total) by mouth daily. 90 tablet 3   folic acid  (FOLVITE ) 1 MG tablet Take 1 tablet (1 mg total) by mouth daily.     glimepiride  (AMARYL ) 1 MG tablet Take 1 tablet (1 mg total) by mouth daily with breakfast. 90 tablet 3   levothyroxine  (SYNTHROID ) 112 MCG tablet Take 1 tablet (112 mcg total) by mouth daily. 90 tablet 3   Multiple Vitamin (MULTIVITAMIN WITH MINERALS) TABS tablet Take 1 tablet by mouth daily.     Propylene Glycol (SYSTANE BALANCE OP) Place 1 drop into both eyes daily as needed (for dry eyes).     thiamine  100 MG tablet Take 1 tablet (100 mg total) by mouth daily.     traZODone  (DESYREL ) 50 MG tablet Take 1 tablet (50 mg total) by mouth at bedtime. (Patient taking differently: Take 50 mg by mouth at bedtime as needed for sleep.) 30 tablet 6   triamcinolone  cream (KENALOG) 0.1 % Apply 1 Application topically 3 (three) times daily. (Patient taking differently: Apply 1 Application topically as needed.)     valsartan  (DIOVAN ) 160 MG tablet Take 1 tablet (160 mg total) by mouth daily. 90 tablet 3   venlafaxine  XR (EFFEXOR  XR) 37.5 MG 24 hr capsule Take 1 capsule (37.5 mg total) by mouth daily with breakfast. 90 capsule 1   No current facility-administered medications for this visit.    PHYSICAL EXAMINATION: ECOG PERFORMANCE STATUS: 1 - Symptomatic but completely ambulatory  Vitals:   06/17/24 1523 06/17/24 1524  BP: (!) 182/56 (!) 162/54  Pulse: 69   Resp: 16   Temp: 98.1 F (36.7 C)   SpO2: 99%    Filed Weights   06/17/24 1523  Weight: 166 lb 6.4 oz (75.5 kg)    Physical Exam Breast exam: No palpable lumps nodules in bilateral breast.  Left breast implant in place  (exam performed in the presence of a chaperone)  LABORATORY DATA:  I have reviewed the data as listed    Latest Ref Rng & Units 02/02/2024    3:02 PM 04/20/2023    2:20 PM 09/27/2022   11:57 AM  CMP  Glucose 65 - 99 mg/dL 96  897  864   BUN 7 - 25 mg/dL  21  22  17    Creatinine 0.60 - 0.95 mg/dL 9.12  9.01  9.15   Sodium 135 - 146 mmol/L 136  141  138   Potassium 3.5 - 5.3 mmol/L 5.2  4.5  4.6   Chloride 98 - 110 mmol/L 101  105  100   CO2 20 - 32 mmol/L 21  26  27    Calcium  8.6 - 10.4 mg/dL 9.9  9.7  9.8   Total Protein 6.0 - 8.3 g/dL  7.4    Total Bilirubin 0.2 - 1.2 mg/dL  1.0    Alkaline Phos 39 - 117 U/L  67    AST 0 - 37 U/L  24    ALT 0 - 35 U/L  26      Lab Results  Component Value Date   WBC 6.8 04/20/2023   HGB 13.5 04/20/2023   HCT 41.0 04/20/2023   MCV 105.2 (H) 04/20/2023  PLT 219.0 04/20/2023   NEUTROABS 4.4 04/20/2023    ASSESSMENT & PLAN:  History of breast cancer 02/29/2016: Right breast palpable mass (with silicone implants 1982), 3.5 cm on MRI, additional 3 cm anterior linear enhancement (biopsy 03/14/2016 IDC grade 3); 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    T2 N0 stage 2A clinical stage   Treatment summary: 1. Neoadjuvant chemotherapy with dose dense Adriamycin  and Cytoxan  4 followed by Abraxane  weekly 5  (stopped early due to neuropathy) 2. 09/13/2016: Right lumpectomy: IDC grade 3, 2 foci, 2 cm and 1.1 cm, 0/3 lymph nodes negative margins negative, ER 0%, PR 0%, HER-2 negative ratio 1.02, Ki-67 60%, RCB-II; ypT2ypN0 Stage 2A (with plastic surgery removing the ruptured implant) 3. Followed by radiation therapy completed 12/06/2016 4. Adjuvant Xeloda  1000 mg by mouth twice a day 2 weeks on one week off 12/30/2016- Oct 2018 5. SWOG 1418 clinical trial Pembrolizumab  cycle 1 given 05/03/2017 (patient decided to discontinue clinical trial) ----------------------------------------------------------------------------------------------------------------------------------------- Surveillance: 1.  Breast exam 06/17/2024: Benign, no palpable lumps or nodules of concern.  Left breast implant 2. mammogram 06/30/2023 breast density category A  Multiple skin cancers: Status post surgical excisions Depression:  Currently on Effexor  and she is doing much better than Cymbalta . Chemo-induced peripheral neuropathy: Continues to be present, stable Hospitalization 10/05/2021-10/13/2021: Fracture of the proximal end of left femur: Total hip arthroplasty on 10/06/2021   She has multiple animals at home (4 dogs, 2 goats and 6 chicken). she stays busy  Return to clinic in 1 year for follow-up    No orders of the defined types were placed in this encounter.  The patient has a good understanding of the overall plan. she agrees with it. she will call with any problems that may develop before the next visit here.  I personally spent a total of 30 minutes in the care of the patient today including preparing to see the patient, getting/reviewing separately obtained history, performing a medically appropriate exam/evaluation, counseling and educating, placing orders, referring and communicating with other health care professionals, documenting clinical information in the EHR, independently interpreting results, communicating results, and coordinating care.   Viinay K Cindia Hustead, MD 06/17/2024    "

## 2024-06-17 NOTE — Research (Signed)
 7 years Follow Up Visit for [phone] Clinical Trial:  Patient into clinic by herself today to see Dr. Gudena for yearly follow- up study visit.   H&P, PS, Toxicity and Recurrence assessment;  Completed by Dr. Gudena. See MD encounter note from today.  SAEs; Patient denies any serious adverse events since last study visit.  Plan; Her next study visit is due in one year  (+/- 4 weeks) and will be scheduled.  Patient was thanked for her ongoing participation in this study.  Asked patient to call research nurse if any questions or problems prior to next study visit.  She verbalized understanding.   Cherylyn Hoard, BSN, RN, [employer] Research Nurse II [phone] 06/17/2024 3:48 PM

## 2024-09-25 ENCOUNTER — Encounter

## 2025-06-18 ENCOUNTER — Inpatient Hospital Stay: Admitting: Hematology and Oncology
